# Patient Record
Sex: Male | Born: 1940 | Race: Black or African American | Hispanic: No | Smoking: Current some day smoker
Health system: Southern US, Community
[De-identification: ages and names within clinical notes are randomized; demographics above are authoritative.]

## PROBLEM LIST (undated history)

## (undated) ENCOUNTER — Ambulatory Visit (HOSPITAL_COMMUNITY): Admission: EM | Payer: No Typology Code available for payment source | Source: Home / Self Care

## (undated) DIAGNOSIS — I442 Atrioventricular block, complete: Secondary | ICD-10-CM

## (undated) DIAGNOSIS — J449 Chronic obstructive pulmonary disease, unspecified: Secondary | ICD-10-CM

## (undated) DIAGNOSIS — I10 Essential (primary) hypertension: Secondary | ICD-10-CM

## (undated) DIAGNOSIS — I255 Ischemic cardiomyopathy: Secondary | ICD-10-CM

## (undated) DIAGNOSIS — M545 Low back pain, unspecified: Secondary | ICD-10-CM

## (undated) DIAGNOSIS — J4489 Other specified chronic obstructive pulmonary disease: Secondary | ICD-10-CM

## (undated) DIAGNOSIS — N183 Chronic kidney disease, stage 3 unspecified: Secondary | ICD-10-CM

## (undated) DIAGNOSIS — I447 Left bundle-branch block, unspecified: Secondary | ICD-10-CM

## (undated) DIAGNOSIS — E785 Hyperlipidemia, unspecified: Secondary | ICD-10-CM

## (undated) DIAGNOSIS — G8929 Other chronic pain: Secondary | ICD-10-CM

## (undated) DIAGNOSIS — I5042 Chronic combined systolic (congestive) and diastolic (congestive) heart failure: Secondary | ICD-10-CM

## (undated) DIAGNOSIS — I2510 Atherosclerotic heart disease of native coronary artery without angina pectoris: Secondary | ICD-10-CM

## (undated) DIAGNOSIS — I472 Ventricular tachycardia, unspecified: Secondary | ICD-10-CM

## (undated) DIAGNOSIS — Z91199 Patient's noncompliance with other medical treatment and regimen due to unspecified reason: Secondary | ICD-10-CM

## (undated) DIAGNOSIS — I517 Cardiomegaly: Secondary | ICD-10-CM

## (undated) DIAGNOSIS — Z9119 Patient's noncompliance with other medical treatment and regimen: Secondary | ICD-10-CM

## (undated) DIAGNOSIS — I480 Paroxysmal atrial fibrillation: Secondary | ICD-10-CM

## (undated) DIAGNOSIS — I429 Cardiomyopathy, unspecified: Secondary | ICD-10-CM

## (undated) HISTORY — DX: Atherosclerotic heart disease of native coronary artery without angina pectoris: I25.10

## (undated) HISTORY — DX: Ventricular tachycardia: I47.2

## (undated) HISTORY — DX: Essential (primary) hypertension: I10

## (undated) HISTORY — DX: Other specified chronic obstructive pulmonary disease: J44.89

## (undated) HISTORY — DX: Chronic obstructive pulmonary disease, unspecified: J44.9

## (undated) HISTORY — DX: Hyperlipidemia, unspecified: E78.5

## (undated) HISTORY — DX: Low back pain, unspecified: M54.50

## (undated) HISTORY — DX: Cardiomegaly: I51.7

## (undated) HISTORY — DX: Patient's noncompliance with other medical treatment and regimen: Z91.19

## (undated) HISTORY — PX: CARDIAC CATHETERIZATION: SHX172

## (undated) HISTORY — DX: Cardiomyopathy, unspecified: I42.9

## (undated) HISTORY — DX: Patient's noncompliance with other medical treatment and regimen due to unspecified reason: Z91.199

## (undated) HISTORY — DX: Left bundle-branch block, unspecified: I44.7

## (undated) HISTORY — DX: Low back pain: M54.5

## (undated) HISTORY — DX: Paroxysmal atrial fibrillation: I48.0

## (undated) HISTORY — DX: Other chronic pain: G89.29

---

## 1997-12-22 ENCOUNTER — Emergency Department (HOSPITAL_COMMUNITY): Admission: EM | Admit: 1997-12-22 | Discharge: 1997-12-22 | Payer: Self-pay | Admitting: Emergency Medicine

## 2004-01-22 ENCOUNTER — Emergency Department (HOSPITAL_COMMUNITY): Admission: EM | Admit: 2004-01-22 | Discharge: 2004-01-22 | Payer: Self-pay | Admitting: Emergency Medicine

## 2005-08-23 ENCOUNTER — Emergency Department (HOSPITAL_COMMUNITY): Admission: EM | Admit: 2005-08-23 | Discharge: 2005-08-23 | Payer: Self-pay | Admitting: Emergency Medicine

## 2006-03-23 ENCOUNTER — Inpatient Hospital Stay (HOSPITAL_COMMUNITY): Admission: EM | Admit: 2006-03-23 | Discharge: 2006-03-23 | Payer: Self-pay | Admitting: Emergency Medicine

## 2007-02-02 ENCOUNTER — Emergency Department (HOSPITAL_COMMUNITY): Admission: EM | Admit: 2007-02-02 | Discharge: 2007-02-02 | Payer: Self-pay | Admitting: Emergency Medicine

## 2007-07-16 ENCOUNTER — Ambulatory Visit: Payer: Self-pay | Admitting: Cardiology

## 2007-07-16 ENCOUNTER — Encounter (INDEPENDENT_AMBULATORY_CARE_PROVIDER_SITE_OTHER): Payer: Self-pay | Admitting: *Deleted

## 2007-07-16 ENCOUNTER — Inpatient Hospital Stay (HOSPITAL_COMMUNITY): Admission: EM | Admit: 2007-07-16 | Discharge: 2007-07-19 | Payer: Self-pay | Admitting: Emergency Medicine

## 2008-01-07 ENCOUNTER — Emergency Department (HOSPITAL_COMMUNITY): Admission: EM | Admit: 2008-01-07 | Discharge: 2008-01-07 | Payer: Self-pay | Admitting: Emergency Medicine

## 2008-05-23 ENCOUNTER — Inpatient Hospital Stay (HOSPITAL_COMMUNITY): Admission: EM | Admit: 2008-05-23 | Discharge: 2008-05-27 | Payer: Self-pay | Admitting: Emergency Medicine

## 2008-05-23 ENCOUNTER — Ambulatory Visit: Payer: Self-pay | Admitting: Cardiology

## 2008-07-05 ENCOUNTER — Encounter (HOSPITAL_COMMUNITY): Admission: RE | Admit: 2008-07-05 | Discharge: 2008-09-09 | Payer: Self-pay | Admitting: *Deleted

## 2008-09-10 ENCOUNTER — Encounter (HOSPITAL_COMMUNITY): Admission: RE | Admit: 2008-09-10 | Discharge: 2008-09-10 | Payer: Self-pay | Admitting: *Deleted

## 2010-05-09 ENCOUNTER — Ambulatory Visit: Payer: Self-pay | Admitting: Cardiology

## 2010-06-02 ENCOUNTER — Ambulatory Visit: Payer: Self-pay | Admitting: Cardiology

## 2010-07-19 ENCOUNTER — Ambulatory Visit (HOSPITAL_COMMUNITY): Admission: RE | Admit: 2010-07-19 | Discharge: 2010-07-19 | Payer: Self-pay | Admitting: Oral Surgery

## 2010-11-21 LAB — BASIC METABOLIC PANEL
Calcium: 9.6 mg/dL (ref 8.4–10.5)
GFR calc Af Amer: >60 mL/min (ref >60)
GFR calc non Af Amer: >60 mL/min (ref >60)
Potassium: 3.6 mEq/L (ref 3.5–5.1)
Sodium: 143 mEq/L (ref 135–145)

## 2010-11-21 LAB — CBC
HCT: 40 % (ref 39.0–52.0)
Hemoglobin: 13.1 g/dL (ref 13.0–17.0)
WBC: 5.1 10*3/uL (ref 4.0–10.5)

## 2010-11-21 LAB — PROTIME-INR
INR: 1.01 (ref 0.00–1.49)
Prothrombin Time: 13.5 seconds (ref 11.6–15.2)

## 2010-11-21 LAB — APTT: aPTT: 25 seconds (ref 24–37)

## 2010-12-19 ENCOUNTER — Other Ambulatory Visit: Payer: Self-pay | Admitting: Cardiology

## 2010-12-19 DIAGNOSIS — I2510 Atherosclerotic heart disease of native coronary artery without angina pectoris: Secondary | ICD-10-CM

## 2010-12-19 MED ORDER — CLOPIDOGREL BISULFATE 75 MG PO TABS: 75.0000 mg | ORAL_TABLET | Freq: Every day | ORAL | Status: DC

## 2010-12-19 NOTE — Telephone Encounter (Signed)
Called for refill on Plavix to be sent to [facility]

## 2010-12-19 NOTE — Telephone Encounter (Signed)
Wants his Plavix called in to [facility] [place]

## 2010-12-21 ENCOUNTER — Other Ambulatory Visit: Payer: Self-pay | Admitting: Cardiology

## 2010-12-21 DIAGNOSIS — I2510 Atherosclerotic heart disease of native coronary artery without angina pectoris: Secondary | ICD-10-CM

## 2010-12-21 MED ORDER — CLOPIDOGREL BISULFATE 75 MG PO TABS: 75.0000 mg | ORAL_TABLET | Freq: Every day | ORAL | Status: DC

## 2010-12-21 NOTE — Telephone Encounter (Signed)
Refill of his perscripton medication. Hung up before I could get the name of the drug or pharmacy. I have pulled his chart.

## 2010-12-21 NOTE — Telephone Encounter (Signed)
Called requesting refill on Plavix. Sent to [facility]

## 2011-01-23 NOTE — Cardiovascular Report (Signed)
NAMECULLIN, DISHMAN NO.:  [id number]   MEDICAL RECORD NO.:  [id number]          PATIENT TYPE:  INP   LOCATION:  2907                         FACILITY:  [facility]   PHYSICIAN:  Peter M. Swaziland, M.D.  DATE OF BIRTH:  [birth year]   DATE OF PROCEDURE:  DATE OF DISCHARGE:                            CARDIAC CATHETERIZATION   INDICATIONS FOR PROCEDURE:  A 70 year old black male is admitted with  hypertensive urgency and non-Q-wave myocardial infarction.  Diagnostic  cardiac catheterization performed by Dr. 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  There  is also 90% stenosis in the mid right coronary artery.   PROCEDURE:  Intracoronary stenting of the mid right coronary artery.   EQUIPMENT USED:  Six FR-4 guide with side holes of 0.014 Asahi medium  wire, a 2.5 x 20-mm apex balloon, a 3.0 x 28-mm Promus drug-eluting  stent, and 3.25 x 20-mm Raywick Voyager balloon.   MEDICATIONS:  The patient was dosed with IV Integrilin per pharmacy.  He  was given weight-adjusted heparin, total of 5500 units IV with  subsequent ACT of 285.  He was given 600 mg Plavix p.o.   After anticoagulation, the lesion in the right coronary was crossed  easily with a wire.  It was predilated using a 2.5 x 20-mm apex balloon  up to 6 atmospheres.  We next deployed the stent, a 3.0 x 28-mm Promus  stent in the mid right coronary.  He had 9 and then 12 atmospheres with  the stent balloon.  It was then postdilated using a 3.25 x 20-mm Tiger  Voyager balloon dilated to 14 atmospheres x2.  This yielded an excellent  angiographic result with 0% residual stenosis and TIMI grade 3 flow.  The patient was maintained on Integrilin postprocedure.  He was also on  IV nitroglycerin and nicardipine for blood pressure control.  He was  painfree.  Final interpretation was successful intracoronary stenting of  the mid right coronary artery with a drug-eluting  stent.           ______________________________  Peter M. Swaziland, M.D.     PMJ/MEDQ  D:  05/24/2008  T:  05/25/2008  Job:  [id number]   cc:   Elmore Guise., M.D.

## 2011-01-23 NOTE — H&P (Signed)
NAMEULUS, HAZEN NO.:  [id number]   MEDICAL RECORD NO.:  [id number]          PATIENT TYPE:  INP   LOCATION:  2907                         FACILITY:  [facility]   PHYSICIAN:  Marca Ancona, MD      DATE OF BIRTH:  12/17/1940   DATE OF ADMISSION:  05/23/2008  DATE OF DISCHARGE:                              HISTORY & PHYSICAL   PRIMARY CARDIOLOGIST:  Rosine Abe, MD   HISTORY OF PRESENT ILLNESS:  This is a 70 year old with a history of  hypertension and end-diastolic congestive heart failure who presented to  the emergency department tonight with substernal chest pain and was  noted to have a hypertensive emergency with blood pressure of 244/112  and elevated cardiac enzymes.  The patient states he has been taking his  Coreg and his clonidine without missing doses.  On Friday, he developed  indigestion feeling in his lower chest wall at rest.  This resolved  with Alka-Seltzer.  He woke up Saturday morning with this pain again.  It came and went all day long on Saturday.  The pain was present also  when he woke up today.  The pain again waxed and waned all day long.  Then around 7 p.m., it became constant, 8/10 chest tightness.  His left  arm felt numb.  The pain was not associated in particular with  exertion.  The patient did come to the emergency department tonight  because the pain did not resolve.  He did have some improvement with  nitroglycerin, but chest pain was still present.  His blood pressure was  noted to be 244/112, but it is now decreased at 188/92 with his  nitroglycerin drip.  The patient does have baseline dyspnea on exertion  with lawn mowing or carrying groceries.  He states this has not worsened  recently.  He has no orthopnea or PND.  Also of note, the patient states  that he was sneezing today and took a cold remedy.  He does not remember  which one.  In the emergency department, he did have a chest CT that was  negative for acute change  and a CTA of his chest with no evidence for PE  or aortic dissection.   PAST MEDICAL HISTORY:  1. Hypertension.  The patient was admitted in November 2008 with      hypertensive urgency.  2. Chronic left bundle-branch block.  3. Diastolic congestive heart failure.  The patient had an      echocardiogram in November 2008 showing an EF of 40-50%, moderate      LVH, mild mitral regurgitation, RV with normal size and function.      There was mild diastolic dysfunction.  4. History of SVT.   SOCIAL HISTORY:  The patient lives in [place] with his wife.  He  works part time with an [employer].  He smokes 1 pack per  week of cigarettes.  He does not drink alcohol or use illicit drugs.   FAMILY HISTORY:  There is no family history of premature coronary artery  disease.   REVIEW  OF SYSTEMS:  Negative except as noted in the history of present  illness.   LABORATORY DATA:  D-dimer was elevated at 0.65.  Initial point-of-care  CK-MB was 16.4, repeat was 11.2.  Initial point-of-care troponin was  0.27, repeat was 0.25.  Platelets 118 and hematocrit 40.4.  Potassium  3.5, sodium 144, BUN 10, creatinine 1.1, and glucose 90.  Chest x-ray  showed no evidence of COPD.  No pneumonia or pulmonary edema.  EKG  showed sinus bradycardia at 45.  There is left atrial enlargement and  left bundle-branch block.  There is no acute change.  Head CT showed no  acute abnormality and CT of the chest showed no PE.  No aortic  dissection.   MEDICATIONS AT HOME:  1. Coreg 3.125 mg b.i.d.  2. Clonidine.  The patient is not sure of the dose.  3. The patient takes Lasix p.r.n.  He states he takes this about twice      a week.  4. He does not take aspirin because it gives him stomach upset.   PHYSICAL EXAMINATION:  VITAL SIGNS:  Blood pressure is initially  244/112, this fell to 188/92 with initiation of nitroglycerin drip;  heart rate is 44 and regular; and O2 sat 98% on 2 L nasal cannula.   GENERAL:  This is a well-developed male in no apparent distress.  NEUROLOGIC:  Alert and oriented x3.  There is normal affect.  There is  grossly normal muscular strength in all major muscle groups.  Cranial  nerves II through XII are grossly intact.  NECK:  JVP is elevated 12 cm of water.  There is no thyromegaly or  thyroid nodule.  LUNGS:  Bilateral rhonchi and mildly decreased breath sounds.  CARDIOVASCULAR:  Heart, regular S1 and S2, bradycardic.  There is a  paradoxically split S2.  There is no murmur.  There is a soft S4.  There  is no carotid bruits.  There is no edema.  ABDOMEN:  Soft and nontender.  No hepatosplenomegaly.  There is no mass.  There are normal bowel sounds.  EXTREMITIES:  No clubbing or cyanosis.  HEENT:  Normal.  SKIN:  Normal.  MUSCULOSKELETAL:  Normal.   ASSESSMENT AND PLAN:  This is a 70 year old with a history of  hypertension who presents with hypertensive emergency and chest pain and  positive cardiac enzymes.  1. Hypertension.  The patient is having hypertensive emergency.  Blood      pressure is 244/112.  Question whether this is somewhat related to      his cold remedy use.  Acutely, we will plan on lowering his blood      pressure to not less than 160/90 tonight.  We will use      nitroglycerin drip for now.  We will also add p.o. amlodipine.  The      patient's heart rate is 44 on Coreg and clonidine.  He is on a      quite a low dose of Coreg.  We will keep that on board for now      especially given the risk of rebound hypertension and tachycardia      off clonidine.  2. Coronary artery disease.  The patient has chest pain with positive      cardiac enzymes.  The question is whether this is a true non-ST      elevation MI or demand ischemia in the setting of a hypertensive      emergency and perhaps  fixed underlying coronary stenosis.  A chest      CT did rule out PE and aortic dissection.  When the patient's blood      pressure is lower, I  will start him on heparin drip.  We will go      ahead and initiate that now actually as his blood pressure is now      down below 200 systolic.  We will start him on aspirin 325 mg daily      as well as low dose of Coreg.  We will start him on 80 mg daily of      Lipitor.  If chest pain is still present with lower blood pressure      and cardiac enzymes continue to rise, we will consider adding      Integrilin, and finally, we will consider a left heart      catheterization this admission perhaps as early as tomorrow.  3. Smoking cessation counseling will be done.  4. Volume status.  The patient is mildly volume overloaded on exam.      This is likely due to diastolic dysfunction in the setting of      significantly increased afterload.  The patient's most recent EF      was 40-50% in November 2008.  I will give him Lasix 40 mg IV x1      now.  We will reassess him in the morning.      Marca Ancona, MD  Electronically Signed    DM/MEDQ  D:  05/23/2008  T:  05/25/2008  Job:  [id number]

## 2011-01-23 NOTE — H&P (Signed)
[name], [name] NO.:  [id number]   MEDICAL RECORD NO.:  [id number]          PATIENT TYPE:  INP   LOCATION:  1824                         FACILITY:  [facility]   PHYSICIAN:  Vernice Jefferson, MD          DATE OF BIRTH:  [birth year]   DATE OF ADMISSION:  07/16/2007  DATE OF DISCHARGE:                              HISTORY & PHYSICAL   PRIMARY PHYSICIAN:  The patient does not have one.  He is unassigned,  will be going to Dr. Reyes Ivan.   CHIEF COMPLAINT:  Shortness of breath.   HISTORY OF PRESENT ILLNESS:  The patient is a 70 year old African-  American male, history of hypertension, who presents with acute onset  shortness of breath that awoke him from sleep.  The patient reports that  he has been on clonidine in the past and stopped taking it approximately  2 weeks ago.  The patient does not report any chest pain, although he  did have some diaphoresis.  On arrival EMS reported blood pressures in  the range of 210/120 as well as saturations in the 82 to 85% range.  On  arrival to the ED the patient was noticed to have a sinus tachycardia  with a left bundle branch morphology with nitroglycerin, morphine and  oxygen improved in terms of his respiratory status and still did not  complain of any chest pain throughout this entire procedure.  On my  interrogation the patient was breathing much more comfortably, did not  have any complaints and still did not complain of any chest pain  symptoms.   PAST MEDICAL HISTORY:  Hypertension, no prior cardiac history.   SOCIAL HISTORY:  Lives in [place] with his wife.  Negative for  tobacco, alcohol.   FAMILY HISTORY:  Reviewed and noncontributory to the patient's current  medical condition.  No early cardiomyopathies in the family. No early  cardiovascular disease in the family.   MEDICATIONS:  He is taking none currently.   REVIEW OF SYSTEMS:  Negative 11-point review of systems except otherwise  dictated in the above  HPI.   PHYSICAL EXAMINATION:  VITAL SIGNS:  His blood pressure initially was  210/120, heart rate 160.  On my evaluation he is 160s/90s.  Heart rates  in the 70s.  GENERAL:  Well-developed, well-nourished African-American male in no  acute distress.  HEENT:  Moist mucous membranes.  Nonicteric sclerae.  No conjunctival  pallor.  NECK:  Supple.  Full range of motion.  Jugular venous pressure estimated  at approximately 9 to 10 cmH2O.  CARDIOVASCULAR:  Regular rate and rhythm.  No rubs, murmurs or gallops.  LUNGS:  Right-sided rales, greater than left-sided rales.  ABDOMEN:  Soft, nontender, nondistended. Normoactive bowel sounds.  EXTREMITIES:  No peripheral edema.  Pulses 2+ bilaterally.   X-RAYS/LAB DATA:  Chest x-ray demonstrates a right-sided effusion versus  infiltrate, more likely to be pulmonary edema.  EKG demonstrates sinus  tachycardia with a left bundle branch morphology which is new compared  to prior ECGs.  Laboratory data:  White count 12.5, hemoglobin 14.8.  chemistry-7 is pending.  Troponin is negative, MB mildly elevated at  7.5.  coagulation studies within normal limits.  A 2D echocardiogram  performed at bedside by me personally and reviewed, demonstrated normal  concentric wall motion.  No significant wall-motion abnormalities.  EF  slightly depressed globally in the range of probably 45 to 50% range.  No old echocardiograms to compare to.  Did have LVH.   IMPRESSION:  1. Hypertensive urgency with likely flash pulmonary edema.  2. New left bundle branch block.  3. Hypercapnic respiratory failure as evidenced by venous blood gases;      pCO2 of 100.   PLAN:  We will admit the patient to step-down unit with nitroglycerin  drip, aspirin and heparin for anticoagulation and follow his bio markers  serially.  Given he has had no chest pain, his improvement and symptoms  with only morphine and respiratory support, as well as no significant  wall-motion abnormalities  on his echocardiogram, we will hold on  activating STEMI lab at this point, follow his next set of bio markers.  There is no evidence clinically that he is having an active infarction.  We will initiate statin and ACE inhibition therapy to help with blood  pressure management, get a 2D echocardiogram in the morning formal.      Vernice Jefferson, MD  Electronically Signed     JT/MEDQ  D:  07/16/2007  T:  07/16/2007  Job:  [id number]

## 2011-01-23 NOTE — Cardiovascular Report (Signed)
[name], [name] NO.:  [id number]   MEDICAL RECORD NO.:  [id number]          PATIENT TYPE:  INP   LOCATION:  2907                         FACILITY:  [facility]   PHYSICIAN:  Elmore Guise., M.D.DATE OF BIRTH:  [birth year]   DATE OF PROCEDURE:  05/24/2008  DATE OF DISCHARGE:                            CARDIAC CATHETERIZATION   INDICATIONS FOR PROCEDURE:  Acute coronary syndrome.   HISTORY OF PRESENT ILLNESS:  [name] is a 70 year old white male with  past medical history of hypertension, COPD who presented with off and on  chest pain for the last week.  He was admitted with hypertensive urgency  with blood pressure 250/120, however, continued to have chest pain  despite blood pressure improvement.  He is now referred for cardiac  catheterization.   DESCRIPTION OF PROCEDURE:  The patient was brought to the Cardiac Cath  Lab.  After appropriate informed consent, he was prepped and draped in  sterile fashion.  Approximately, 5 mL of 1% lidocaine was used for local  anesthesia.  A 5-French sheath was placed in the right femoral artery  under fluoroscopic guidance.  Coronary angiography and LV angiography  were then performed.  Sheath was left in place for possible PCI.   FINDINGS:  1. Left main:  Normal.  2. LAD:  Large vessel with mild luminal irregularities.  3. D1:  Moderate-sized vessel with mild luminal irregularities.  4. LCX:  Nondominant with mild luminal irregularities.  5. OM1:  Subtotal obstruction at trifurcation with 3 smaller vessels      all filling faintly via antegrade and left-to-left collaterals.  6. RCA:  Moderate-sized vessel with mid 80% stenosis with diffuse      luminal irregularities.  7. PDA/PLV:  Mild luminal irregularities.  8. LV:  EF is 50% with no significant wall motion abnormalities.  9. Limited right femoral angiogram was performed.  It showed a 50%      likely vessel spasm at sheath insertion, otherwise diffuse luminal     irregularities.  No evidence of dissection or extravasation of dye.   IMPRESSION:  1. 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  2. Low normal left ventricular systolic function, ejection fraction      50%.   PLAN:  At this time, I would recommend aggressive medical therapy.  I  discussed films with Dr. Swaziland, interventional cardiologist, who will  proceed with elective PCI of RCA with medical treatment of his OM  disease.  Following procedure, the patient will be transported back to  the CCU for continued care.     Elmore Guise., M.D.  Electronically Signed    TWK/MEDQ  D:  05/24/2008  T:  05/25/2008  Job:  [id number]

## 2011-01-23 NOTE — Discharge Summary (Signed)
[name], PICKUP NO.:  [id number]   MEDICAL RECORD NO.:  [id number]          PATIENT TYPE:  INP   LOCATION:  4713                         FACILITY:  [facility]   PHYSICIAN:  Elmore Guise., M.D.DATE OF BIRTH:  September 18, 1940   DATE OF ADMISSION:  07/16/2007  DATE OF DISCHARGE:  07/19/2007                               DISCHARGE SUMMARY   DISCHARGE DIAGNOSIS:  1. Hypertensive urgency.  2. Mild volume overload secondary to #1 number  3. Mild LV dysfunction.  4. Diastolic dysfunction  5. History of noncompliance.   HISTORY OF PRESENT ILLNESS:  [name] is a 70 year old African  American male who presented with 3-day history of increasing shortness  of breath.  He presented to the emergency department in near respiratory  failure.   HOSPITAL COURSE:  The patient was treated aggressively.  He is initially  started on BiPAP, nitroglycerin drip and given IV antihypertensives with  significant improvement in his symptoms.  He was quickly weaned from his  BiPAP.  He was initially treated with IV Lasix and improved very  quickly.  On arrival, he was noted to have SVT with heart rates in the  180s.  This improved significantly with treatment of his hypertension as  well as respiratory problems.  After being transferred to the CCU which  he remained in for 24 hours, he was transferred to telemetry monitoring.  He had brief nonsustained episodes of atrial tachycardia noted.  He has  now been up and ambulatory.  His blood pressure has had significant  improvement, now improving to the 150 range.  He seems to be tolerating  his current medications very well.  He is euvolemic by exam his cardiac  markers were negative for acute coronary syndrome.  He will be  discharged home today to continue the following medications.   DISCHARGE MEDICATIONS:  1. Lotrel 05/20 mg one tablet twice daily.  2. Coreg 3.25 mg one tablet twice daily and  3. Lasix 3 mg one tablet one time  daily.  4. Aspirin 325 mg daily.  5. Potassium 40 mEq daily.   DISCHARGE INSTRUCTIONS:  He was given heart failure instructions.  Specifically, he was told to weigh himself daily.  If his weight  increases more than 2 pounds in any 24 hours, he is to take an  additional dose of Lasix.  Unless he has any further problems, I plan to  see him back in the office in 2 weeks.  All his questions were answered  prior to discharge      Elmore Guise., M.D.  Electronically Signed    TWK/MEDQ  D:  07/19/2007  T:  07/21/2007  Job:  [id number]

## 2011-01-26 NOTE — Discharge Summary (Signed)
[name], [name] NO.:  [id number]   MEDICAL RECORD NO.:  [id number]          PATIENT TYPE:  INP   LOCATION:  2005                         FACILITY:  [facility]   PHYSICIAN:  Elmore Guise., M.D.DATE OF BIRTH:  [birth year]   DATE OF ADMISSION:  05/23/2008  DATE OF DISCHARGE:  05/27/2008                               DISCHARGE SUMMARY   DISCHARGE DIAGNOSES:  1. Acute coronary syndrome.  2. Hypertension/hypertensive urgency.  3. Dyslipidemia.  4. Right forearm cellulitis.  5. Chronic LBBB   HISTORY OF PRESENT ILLNESS:  [name] is a 70 year old African-  American male who presented with chest pain and hypertensive urgency.  He was admitted for evaluation and treatment.   HOSPITAL COURSE:  The patient was admitted to the CCU.  He was placed on  nicardipine and nitroglycerin drip with good blood pressure control.  He  continued to have chest pain despite blood pressure control and ruled in  for a myocardial infarction.  After blood pressure control was achieved,  he was taken to the cath lab, and he was shown to have obstructive mid  RCA as well as obstructive obtuse marginal disease.  His mid RCA was  treated with stenting.  His obtuse marginal had faint collaterals and  thought to be chronic in nature and due to the size of the vessel being  very small, it was deemed at that time not to proceed with either  angioplasty or stenting.  Following his catheterization, the patient  remained chest pain free.  His blood pressure remained well controlled.  He did have a fever up to 101.8.  This was associated with a right  forearm cellulitis from an old IV site.  He was treated with Ancef for  36 hours with resolution of his fever.  He is now been tolerating  cardiac rehab very well.  He has had no further chest pain or shortness  of breath.   DISCHARGE MEDICATIONS:  He will be discharged, to continue the following  medications:  1. Coreg 3.125 mg twice daily.  2. Aspirin 325 mg daily.  3. Plavix 75 mg daily.  4. Amlodipine 10 mg daily.  5. Imdur 60 mg daily.  6. Lipitor 40 mg daily.  7. Keflex 500 mg 3 times daily for the next 7 days.  8. Nitroglycerin 0.4 mg 1 sublingual q.5 minutes p.r.n. chest pain.   The patient will come by the office for samples of his Plavix and  Lipitor.  I did discuss slowly increase in his activity as tolerated.  He is greater than 48 hours from his catheterization.  He should not  have no significant post  cath restrictions.  He is to call the office if he has any problems such  as increasing chest pain, shortness of breath, orthopnea, PND, or  swelling from his cath site.  His follow-up appointment will be with Dr.  Reyes Brewer in 1-2 weeks.  He is to call if he has any further problems.      Elmore Guise., M.D.  Electronically Signed     TWK/MEDQ  D:  05/28/2008  T:  05/29/2008  Job:  [id number]

## 2011-04-16 ENCOUNTER — Other Ambulatory Visit: Payer: Self-pay | Admitting: Cardiology

## 2011-04-16 NOTE — Telephone Encounter (Signed)
escribe medication per fax request  

## 2011-06-05 LAB — CBC
HCT: 39.3
Hemoglobin: 12.8 — ABNORMAL LOW
MCHC: 32.7
MCV: 87.7
Platelets: 147 — ABNORMAL LOW
RBC: 4.48
RDW: 13
WBC: 4.9

## 2011-06-05 LAB — POCT CARDIAC MARKERS
CKMB, poc: 1.7
Myoglobin, poc: 56.7
Operator id: 270651
Troponin i, poc: <0.05

## 2011-06-05 LAB — DIFFERENTIAL
Basophils Absolute: 0
Basophils Relative: 1
Lymphocytes Relative: 45
Monocytes Absolute: 0.6
Neutro Abs: 2
Neutrophils Relative %: 40 — ABNORMAL LOW

## 2011-06-05 LAB — POCT I-STAT, CHEM 8
BUN: 8
Calcium, Ion: 1.19
Chloride: 102
HCT: 43
Potassium: 3.7

## 2011-06-05 LAB — B-NATRIURETIC PEPTIDE (CONVERTED LAB): Pro B Natriuretic peptide (BNP): 85

## 2011-06-11 LAB — CULTURE, BLOOD (ROUTINE X 2)
Culture: NO GROWTH
Culture: NO GROWTH

## 2011-06-11 LAB — URINE CULTURE
Colony Count: NO GROWTH
Culture: NO GROWTH

## 2011-06-11 LAB — BASIC METABOLIC PANEL
BUN: 12
BUN: 4 — ABNORMAL LOW
Calcium: 9.2
Chloride: 103
Chloride: 105
Creatinine, Ser: 0.67
Creatinine, Ser: 0.88
GFR calc non Af Amer: >60
GFR calc non Af Amer: >60
Glucose, Bld: 108 — ABNORMAL HIGH
Glucose, Bld: 89
Potassium: 3.3 — ABNORMAL LOW
Sodium: 140

## 2011-06-11 LAB — CBC
HCT: 35.8 — ABNORMAL LOW
HCT: 36.3 — ABNORMAL LOW
Hemoglobin: 12.1 — ABNORMAL LOW
MCV: 85.7
Platelets: 114 — ABNORMAL LOW
RDW: 13.3
RDW: 13.3
WBC: 9.8

## 2011-06-13 LAB — CBC
HCT: 39.5
HCT: 40.4
Hemoglobin: 13.2
MCHC: 32.6
MCHC: 33.8
MCV: 84.7
MCV: 85.8
MCV: 86.1
MCV: 87.3
Platelets: 117 — ABNORMAL LOW
Platelets: 118 — ABNORMAL LOW
Platelets: 123 — ABNORMAL LOW
Platelets: 126 — ABNORMAL LOW
RBC: 4.23
RBC: 4.4
RBC: 4.63
RDW: 13.6
RDW: 13.6
RDW: 13.6
WBC: 4.9
WBC: 8.9

## 2011-06-13 LAB — CK TOTAL AND CKMB (NOT AT ARMC)
CK, MB: 158.4 — ABNORMAL HIGH
CK, MB: 35.2 — ABNORMAL HIGH
CK, MB: 91.6 — ABNORMAL HIGH
Relative Index: 12.9 — ABNORMAL HIGH
Total CK: 350 — ABNORMAL HIGH
Total CK: 617 — ABNORMAL HIGH
Total CK: 991 — ABNORMAL HIGH

## 2011-06-13 LAB — COMPREHENSIVE METABOLIC PANEL
ALT: 18
AST: 41 — ABNORMAL HIGH
Albumin: 4
CO2: 29
Chloride: 104
Creatinine, Ser: 0.78
GFR calc Af Amer: >60
Sodium: 140
Total Bilirubin: 1.3 — ABNORMAL HIGH

## 2011-06-13 LAB — POCT CARDIAC MARKERS
CKMB, poc: 11.2
CKMB, poc: 16.4
Myoglobin, poc: 67.3
Myoglobin, poc: 74.3
Troponin i, poc: 0.25 — ABNORMAL HIGH
Troponin i, poc: 0.27 — ABNORMAL HIGH

## 2011-06-13 LAB — PROTIME-INR
INR: 1.2
Prothrombin Time: 15.1

## 2011-06-13 LAB — TSH: TSH: 4.182

## 2011-06-13 LAB — DIFFERENTIAL
Basophils Relative: 1
Eosinophils Absolute: 0.1
Lymphs Abs: 2.9
Neutrophils Relative %: 31 — ABNORMAL LOW

## 2011-06-13 LAB — LIPID PANEL
HDL: 63
Total CHOL/HDL Ratio: 2.9
Triglycerides: 52
VLDL: 10

## 2011-06-13 LAB — POCT I-STAT, CHEM 8
BUN: 10
HCT: 43
Hemoglobin: 14.6
Sodium: 144
TCO2: 29

## 2011-06-13 LAB — TROPONIN I
Troponin I: 1.06
Troponin I: 2.06

## 2011-06-13 LAB — D-DIMER, QUANTITATIVE: D-Dimer, Quant: 0.65 — ABNORMAL HIGH

## 2011-06-13 LAB — HEPARIN LEVEL (UNFRACTIONATED): Heparin Unfractionated: 0.49

## 2011-06-13 LAB — B-NATRIURETIC PEPTIDE (CONVERTED LAB): Pro B Natriuretic peptide (BNP): 417 — ABNORMAL HIGH

## 2011-06-19 LAB — BASIC METABOLIC PANEL
BUN: 13
CO2: 28
CO2: 28
CO2: 28
Calcium: 8.5
Calcium: 8.8
Calcium: 9
Chloride: 105
GFR calc Af Amer: >60
GFR calc Af Amer: >60
Glucose, Bld: 95
Potassium: 3.4 — ABNORMAL LOW
Potassium: 3.5
Sodium: 137
Sodium: 139
Sodium: 140

## 2011-06-19 LAB — COMPREHENSIVE METABOLIC PANEL
ALT: 17
AST: 35
Albumin: 3.6
Alkaline Phosphatase: 85
BUN: 14
Chloride: 103
Potassium: 3 — ABNORMAL LOW
Sodium: 142
Total Bilirubin: 1.3 — ABNORMAL HIGH
Total Protein: 6.3

## 2011-06-19 LAB — POCT I-STAT 3, ART BLOOD GAS (G3+)
O2 Saturation: 98
TCO2: 24
pCO2 arterial: 53.5 — ABNORMAL HIGH
pO2, Arterial: 126 — ABNORMAL HIGH

## 2011-06-19 LAB — CBC
HCT: 36.3 — ABNORMAL LOW
HCT: 36.8 — ABNORMAL LOW
HCT: 37.9 — ABNORMAL LOW
HCT: 44.7
Hemoglobin: 12.1 — ABNORMAL LOW
Hemoglobin: 12.5 — ABNORMAL LOW
Hemoglobin: 14.8
MCHC: 33
MCHC: 33.1
MCHC: 33.3
MCV: 87.2
Platelets: 113 — ABNORMAL LOW
Platelets: 124 — ABNORMAL LOW
RBC: 4.28
RBC: 5.13
RDW: 13.1
WBC: 8.3

## 2011-06-19 LAB — LIPID PANEL
Cholesterol: 180
HDL: 60
Total CHOL/HDL Ratio: 3
VLDL: 13

## 2011-06-19 LAB — I-STAT 8, (EC8 V) (CONVERTED LAB)
Acid-base deficit: 8 — ABNORMAL HIGH
Potassium: 3.5
TCO2: 30
pH, Ven: 7.019 — CL

## 2011-06-19 LAB — DIFFERENTIAL
Basophils Relative: 0
Eosinophils Absolute: 0.1
Monocytes Absolute: 0.8 — ABNORMAL HIGH
Neutro Abs: 3.1

## 2011-06-19 LAB — CARDIAC PANEL(CRET KIN+CKTOT+MB+TROPI)
CK, MB: 6.1 — ABNORMAL HIGH
CK, MB: 8.3 — ABNORMAL HIGH
Relative Index: 0.8
Troponin I: 0.14 — ABNORMAL HIGH

## 2011-06-19 LAB — CK TOTAL AND CKMB (NOT AT ARMC)
CK, MB: 12.5 — ABNORMAL HIGH
Relative Index: 1.6
Total CK: 791 — ABNORMAL HIGH

## 2011-06-19 LAB — TROPONIN I: Troponin I: 0.15 — ABNORMAL HIGH

## 2011-06-19 LAB — POCT I-STAT CREATININE
Creatinine, Ser: 1.2
Operator id: 279831

## 2011-06-19 LAB — POCT CARDIAC MARKERS: Myoglobin, poc: 469

## 2011-06-19 LAB — PHOSPHORUS: Phosphorus: 4.6

## 2011-08-04 ENCOUNTER — Other Ambulatory Visit: Payer: Self-pay | Admitting: Cardiology

## 2011-08-08 MED ORDER — CLOPIDOGREL BISULFATE 75 MG PO TABS: 75.0000 mg | ORAL_TABLET | Freq: Every day | ORAL | Status: DC

## 2011-08-08 NOTE — Telephone Encounter (Signed)
Addended by: Royanne Foots on: 08/08/2011 10:50 AM   Modules accepted: Orders

## 2011-09-10 ENCOUNTER — Telehealth: Payer: Self-pay | Admitting: Cardiology

## 2011-09-10 MED ORDER — AMLODIPINE BESYLATE 10 MG PO TABS: 10.0000 mg | ORAL_TABLET | Freq: Every day | ORAL | Status: DC

## 2011-09-10 NOTE — Telephone Encounter (Signed)
New Problem:    Patient called needing a refill of his amLODipine (NORVASC) 10 MG tablet.

## 2011-10-10 ENCOUNTER — Encounter: Payer: Self-pay | Admitting: Cardiology

## 2011-10-10 ENCOUNTER — Ambulatory Visit (INDEPENDENT_AMBULATORY_CARE_PROVIDER_SITE_OTHER): Payer: Medicare Other | Admitting: Cardiology

## 2011-10-10 VITALS — BP 156/84 | HR 64 | Ht 68.0 in | Wt 154.0 lb

## 2011-10-10 DIAGNOSIS — I10 Essential (primary) hypertension: Secondary | ICD-10-CM

## 2011-10-10 DIAGNOSIS — I2510 Atherosclerotic heart disease of native coronary artery without angina pectoris: Secondary | ICD-10-CM | POA: Insufficient documentation

## 2011-10-10 DIAGNOSIS — Z9119 Patient's noncompliance with other medical treatment and regimen: Secondary | ICD-10-CM | POA: Insufficient documentation

## 2011-10-10 DIAGNOSIS — R0609 Other forms of dyspnea: Secondary | ICD-10-CM

## 2011-10-10 DIAGNOSIS — Z91199 Patient's noncompliance with other medical treatment and regimen due to unspecified reason: Secondary | ICD-10-CM | POA: Insufficient documentation

## 2011-10-10 DIAGNOSIS — F17200 Nicotine dependence, unspecified, uncomplicated: Secondary | ICD-10-CM

## 2011-10-10 DIAGNOSIS — R0600 Dyspnea, unspecified: Secondary | ICD-10-CM

## 2011-10-10 DIAGNOSIS — Z720 Tobacco use: Secondary | ICD-10-CM | POA: Insufficient documentation

## 2011-10-10 DIAGNOSIS — I447 Left bundle-branch block, unspecified: Secondary | ICD-10-CM

## 2011-10-10 DIAGNOSIS — E785 Hyperlipidemia, unspecified: Secondary | ICD-10-CM | POA: Insufficient documentation

## 2011-10-10 MED ORDER — NITROGLYCERIN 0.4 MG SL SUBL: 0.4000 mg | SUBLINGUAL_TABLET | SUBLINGUAL | Status: DC | PRN

## 2011-10-10 MED ORDER — AMLODIPINE BESYLATE 10 MG PO TABS: 10.0000 mg | ORAL_TABLET | Freq: Every day | ORAL | Status: DC

## 2011-10-10 MED ORDER — ATORVASTATIN CALCIUM 10 MG PO TABS: 10.0000 mg | ORAL_TABLET | Freq: Every day | ORAL | Status: DC

## 2011-10-10 MED ORDER — CLOPIDOGREL BISULFATE 75 MG PO TABS: 75.0000 mg | ORAL_TABLET | Freq: Every day | ORAL | Status: DC

## 2011-10-10 MED ORDER — HYDROCHLOROTHIAZIDE 25 MG PO TABS: 25.0000 mg | ORAL_TABLET | Freq: Every day | ORAL | Status: DC

## 2011-10-10 NOTE — Assessment & Plan Note (Signed)
>>  ASSESSMENT AND PLAN FOR CAD (CORONARY ARTERY DISEASE) WRITTEN ON 10/10/2011  2:21 PM BY Swaziland, PETER M, MD  Status post stenting of the midright coronary in 2009. He does have symptoms of dyspnea and left shoulder discomfort. He has a chronic left bundle branch block. We'll schedule him for a [employer] study. I suspect a lot of his symptoms are related to his uncontrolled hypertension.

## 2011-10-10 NOTE — Assessment & Plan Note (Signed)
Status post stenting of the midright coronary in 2009. He does have symptoms of dyspnea and left shoulder discomfort. He has a chronic left bundle branch block. We'll schedule him for a [employer] study. I suspect a lot of his symptoms are related to his uncontrolled hypertension.

## 2011-10-10 NOTE — Assessment & Plan Note (Signed)
>>  ASSESSMENT AND PLAN FOR CORONARY ARTERY DISEASE INVOLVING NATIVE CORONARY ARTERY OF NATIVE HEART WITHOUT ANGINA PECTORIS WRITTEN ON 06/12/2023  7:15 AM BY Tasmin Exantus, DO  >>ASSESSMENT AND PLAN FOR CAD (CORONARY ARTERY DISEASE) WRITTEN ON 10/10/2011  2:21 PM BY [place], PETER M, MD  Status post stenting of the midright coronary in 2009. He does have symptoms of dyspnea and left shoulder discomfort. He has a chronic left bundle branch block. We'll schedule him for a [employer] study. I suspect a lot of his symptoms are related to his uncontrolled hypertension.

## 2011-10-10 NOTE — Assessment & Plan Note (Signed)
Poorly controlled blood pressure. I reviewed his Norvasc. We will start him back on HCTZ 25 mg per day. He is intolerant to ACE inhibitor, ARB, and beta blocker therapy. I stressed the importance of sodium restriction and a heart healthy diet. His blood pressure is still poorly controlled I would add hydralazine.

## 2011-10-10 NOTE — Assessment & Plan Note (Signed)
We will check fasting lab work as a baseline. We'll start him on Lipitor 10 mg daily.

## 2011-10-10 NOTE — Assessment & Plan Note (Signed)
I stressed the importance of smoking cessation. He states he only smokes when he is stressed. I told him he needed to quit smoking completely.

## 2011-10-10 NOTE — Progress Notes (Signed)
   [name] Date of Birth: [birth year] Medical Record #[id number]  History of Present Illness: [name] is seen today for followup. He was last seen in September of 2011. He has been noncompliant with his medical therapy. He complains that he has had increased shortness of breath with exertion and numbness in his left shoulder. His blood pressure at home has been very elevated. He ran out of his Plavix in December and is about to run out of his Norvasc. He previously was also taking Lipitor, isosorbide, hydralazine, and HCTZ. All these medicines have been discontinued. Review of his old records indicate that he was noncompliant previously and had a poor understanding of his medical condition. He does have a history of coronary disease and had stenting of a long lesion in the mid right coronary in 2009 with a drug-eluting stent. He has a history of severe hypertension, dyslipidemia, bradycardia, and mild LV dysfunction. He has a history of intolerance to ACE inhibitors and angiotensin receptor blockers. He is not a candidate for beta blockers because of bradycardia. He reports gastric upset with aspirin in the past.  No current outpatient prescriptions on file prior to visit.    No Known Allergies  Past Medical History  Diagnosis Date  . Acute coronary syndrome   . Dyslipidemia   . Cellulitis     Right forearm cellulitis  . LBBB (left bundle branch block)   . CAD (coronary artery disease)     s/p stent RCA DES 9/09  . HTN (hypertension)     Past Surgical History  Procedure Date  . Cardiac catheterization     ejection fraction 50%    History  Smoking status  . Current Everyday Smoker  Smokeless tobacco  . Not on file    History  Alcohol Use No    No family history on file.  Review of Systems: As noted in history of present illness.  All other systems were reviewed and are negative.  Physical Exam: BP 156/84  Pulse 64  Ht 5\' 8"  (1.727 m)  Wt 154 lb (69.854 kg)   BMI 23.42 kg/m2 Is a pleasant black male in no acute distress.The patient is alert and oriented x 3.  The mood and affect are normal.  The skin is warm and dry.  Color is normal.  The HEENT exam reveals that the sclera are nonicteric.  The mucous membranes are moist.  The carotids are 2+ without bruits.  There is no thyromegaly.  There is no JVD.  The lungs are clear.  The chest wall is non tender.  The heart exam reveals a regular rate with a normal S1 and S2.  There is a soft systolic murmur heard at the left sternal border..  The PMI is not displaced.   Abdominal exam reveals good bowel sounds.  There is no guarding or rebound.  There is no hepatosplenomegaly or tenderness.  There are no masses.  Exam of the legs reveal no clubbing, cyanosis, or edema.  The legs are without rashes.  The distal pulses are intact.  Cranial nerves II - XII are intact.  Motor and sensory functions are intact.  The gait is normal.  LABORATORY DATA: ECG shows sinus bradycardia with a left bundle branch block.  Assessment / Plan:

## 2011-10-10 NOTE — Patient Instructions (Signed)
Continue on Plavix 75 mg daily and Norvasc 10 mg daily.  We will start you back on HCTZ 25 mg daily for your blood pressure and Lipitor 10 mg daily for cholesterol.  We will schedule you for a nuclear stress test.  Stop smoking completely!  We will check fasting lab work when you come for your stress test.

## 2011-10-24 ENCOUNTER — Other Ambulatory Visit (HOSPITAL_COMMUNITY): Payer: PRIVATE HEALTH INSURANCE | Admitting: Radiology

## 2011-10-24 ENCOUNTER — Other Ambulatory Visit: Payer: PRIVATE HEALTH INSURANCE | Admitting: *Deleted

## 2011-11-07 ENCOUNTER — Ambulatory Visit: Payer: PRIVATE HEALTH INSURANCE | Admitting: Nurse Practitioner

## 2011-11-12 ENCOUNTER — Ambulatory Visit (HOSPITAL_COMMUNITY): Payer: Medicare Other | Attending: Internal Medicine | Admitting: Radiology

## 2011-11-12 ENCOUNTER — Other Ambulatory Visit (INDEPENDENT_AMBULATORY_CARE_PROVIDER_SITE_OTHER): Payer: Medicare Other

## 2011-11-12 DIAGNOSIS — J4489 Other specified chronic obstructive pulmonary disease: Secondary | ICD-10-CM | POA: Insufficient documentation

## 2011-11-12 DIAGNOSIS — E785 Hyperlipidemia, unspecified: Secondary | ICD-10-CM | POA: Insufficient documentation

## 2011-11-12 DIAGNOSIS — I2510 Atherosclerotic heart disease of native coronary artery without angina pectoris: Secondary | ICD-10-CM

## 2011-11-12 DIAGNOSIS — R000 Tachycardia, unspecified: Secondary | ICD-10-CM | POA: Insufficient documentation

## 2011-11-12 DIAGNOSIS — R0609 Other forms of dyspnea: Secondary | ICD-10-CM | POA: Insufficient documentation

## 2011-11-12 DIAGNOSIS — R079 Chest pain, unspecified: Secondary | ICD-10-CM | POA: Insufficient documentation

## 2011-11-12 DIAGNOSIS — R209 Unspecified disturbances of skin sensation: Secondary | ICD-10-CM | POA: Insufficient documentation

## 2011-11-12 DIAGNOSIS — R0600 Dyspnea, unspecified: Secondary | ICD-10-CM

## 2011-11-12 DIAGNOSIS — I10 Essential (primary) hypertension: Secondary | ICD-10-CM | POA: Insufficient documentation

## 2011-11-12 DIAGNOSIS — R002 Palpitations: Secondary | ICD-10-CM | POA: Insufficient documentation

## 2011-11-12 DIAGNOSIS — I447 Left bundle-branch block, unspecified: Secondary | ICD-10-CM | POA: Insufficient documentation

## 2011-11-12 DIAGNOSIS — Z87891 Personal history of nicotine dependence: Secondary | ICD-10-CM | POA: Insufficient documentation

## 2011-11-12 DIAGNOSIS — J449 Chronic obstructive pulmonary disease, unspecified: Secondary | ICD-10-CM | POA: Insufficient documentation

## 2011-11-12 DIAGNOSIS — R0989 Other specified symptoms and signs involving the circulatory and respiratory systems: Secondary | ICD-10-CM | POA: Insufficient documentation

## 2011-11-12 DIAGNOSIS — R42 Dizziness and giddiness: Secondary | ICD-10-CM | POA: Insufficient documentation

## 2011-11-12 DIAGNOSIS — R0602 Shortness of breath: Secondary | ICD-10-CM

## 2011-11-12 DIAGNOSIS — R5381 Other malaise: Secondary | ICD-10-CM | POA: Insufficient documentation

## 2011-11-12 LAB — CBC WITH DIFFERENTIAL/PLATELET
Basophils Relative: 0.4 % (ref 0.0–3.0)
Eosinophils Relative: 0.9 % (ref 0.0–5.0)
HCT: 38.7 % — ABNORMAL LOW (ref 39.0–52.0)
Hemoglobin: 12.3 g/dL — ABNORMAL LOW (ref 13.0–17.0)
Lymphs Abs: 3.2 10*3/uL (ref 0.7–4.0)
MCV: 87.9 fl (ref 78.0–100.0)
Monocytes Absolute: 0.4 10*3/uL (ref 0.1–1.0)
Monocytes Relative: 7.4 % (ref 3.0–12.0)
Neutro Abs: 1.5 10*3/uL (ref 1.4–7.7)
Platelets: 141 10*3/uL — ABNORMAL LOW (ref 150.0–400.0)
RBC: 4.4 Mil/uL (ref 4.22–5.81)
WBC: 5.2 10*3/uL (ref 4.5–10.5)

## 2011-11-12 LAB — BASIC METABOLIC PANEL
BUN: 10 mg/dL (ref 6–23)
CO2: 29 mEq/L (ref 19–32)
Chloride: 104 mEq/L (ref 96–112)
Creatinine, Ser: 0.7 mg/dL (ref 0.4–1.5)
Potassium: 2.9 mEq/L — ABNORMAL LOW (ref 3.5–5.1)

## 2011-11-12 LAB — HEPATIC FUNCTION PANEL
ALT: 8 U/L (ref 0–53)
AST: 14 U/L (ref 0–37)
Albumin: 4 g/dL (ref 3.5–5.2)
Total Protein: 7 g/dL (ref 6.0–8.3)

## 2011-11-12 LAB — LIPID PANEL
Cholesterol: 139 mg/dL (ref 0–200)
Triglycerides: 101 mg/dL (ref 0.0–149.0)

## 2011-11-12 MED ORDER — TECHNETIUM TC 99M TETROFOSMIN IV KIT
33.0000 | PACK | Freq: Once | INTRAVENOUS | Status: AC | PRN
  Administered 2011-11-12: 33 via INTRAVENOUS

## 2011-11-12 MED ORDER — REGADENOSON 0.4 MG/5ML IV SOLN
0.4000 mg | Freq: Once | INTRAVENOUS | Status: AC
  Administered 2011-11-12: 0.4 mg via INTRAVENOUS

## 2011-11-12 MED ORDER — TECHNETIUM TC 99M TETROFOSMIN IV KIT
10.5000 | PACK | Freq: Once | INTRAVENOUS | Status: AC | PRN
  Administered 2011-11-12: 11 via INTRAVENOUS

## 2011-11-12 NOTE — Progress Notes (Signed)
[facility] SITE 3 NUCLEAR MED [address] [place] [address] [phone]  Cardiology Nuclear Med Study  LOWEN BARRINGER is a 71 y.o. male [id number] May 07, 1941   Nuclear Med Background Indication for Stress Test:  Evaluation for Ischemia History:  '08 Echo:EF=40-50%, mild LVD; '09 NSTEMI>Stent-RCA; COPD Cardiac Risk Factors: History of Smoking-quit x 3 weeks, Hypertension, LBBB and Lipids  Symptoms:  Chest Pain with (B) Shoulder Numbness (last episode of chest discomfort was about 2-weeks ago), Dizziness, DOE, Fatigue, Palpitations and Rapid HR   Nuclear Pre-Procedure Caffeine/Decaff Intake:  None NPO After: 7:00pm   Lungs:  Slight expiratory wheezes                               throughout, O2 SAT 98% on RA. IV 0.9% NS with Angio Cath:  20g  IV Site: R Antecubital  IV Started by:  Stanton Kidney, EMT-P  Chest Size (in):  38 Cup Size: n/a  Height: 5\' 8"  (1.727 m)  Weight:  151 lb (68.493 kg)  BMI:  Body mass index is 22.96 kg/(m^2). Tech Comments:  NA    Nuclear Med Study 1 or 2 day study: 1 day  Stress Test Type:  Lexiscan  Reading MD: Cassell Clement, MD  Order Authorizing Provider:  Peter Swaziland, MD  Resting Radionuclide: Technetium 100m Tetrofosmin  Resting Radionuclide Dose: 10.5 mCi   Stress Radionuclide:  Technetium 72m Tetrofosmin  Stress Radionuclide Dose: 33.0 mCi           Stress Protocol Rest HR: 53 Stress HR: 75  Rest BP: 151/73 Stress BP: 150/85  Exercise Time (min): n/a METS: n/a   Predicted Max HR: 152 bpm % Max HR: 49.34 bpm Rate Pressure Product: 40347   Dose of Adenosine (mg):  n/a Dose of Lexiscan: 0.4 mg  Dose of Atropine (mg): n/a Dose of Dobutamine: n/a mcg/kg/min (at max HR)  Stress Test Technologist: Smiley Houseman, CMA-N  Nuclear Technologist:  Domenic Polite, CNMT     Rest Procedure:  Myocardial perfusion imaging was performed at rest 45 minutes following the intravenous administration of Technetium 34m  Tetrofosmin.  Rest ECG: LBBB, LVH with occasional PVC.  Stress Procedure:  The patient received IV Lexiscan 0.4 mg over 15-seconds.  Technetium 22m Tetrofosmin injected at 30-seconds.  There were no significant changes with Lexiscan, occasional PVC noted.  Quantitative spect images were obtained after a 45 minute delay.  Stress ECG: Uninteretable due to baseline LBBB  QPS Raw Data Images:  Images were motion corrected.  Soft tissue (diaphragm) underlies heart. Stress Images:  Normal homogeneous uptake in all areas of the myocardium. Rest Images:  Comparison with the stress images reveals no significant change. Subtraction (SDS):  No evidence of ischemia. Transient Ischemic Dilatation (Normal <1.22):  1.06 Lung/Heart Ratio (Normal <0.45):  0.23  Quantitative Gated Spect Images QGS EDV:  149 ml QGS ESV:  90 ml QGS cine images:  LVEF calculated at 40% with inferior and septal hypokinesis. QGS EF: 40%  Impression Exercise Capacity:  Lexiscan with no exercise. BP Response:  Normal blood pressure response. Clinical Symptoms:  No chest pain. ECG Impression:  Baseline:  LBBB.  EKG uninterpretable due to LBBB at rest and stress. Comparison with Prior Nuclear Study: No previous nuclear study performed  Overall Impression:  Myoview scan with normal perfusion.  LVEF 40%.  Consider echo if not done to reevaluate LV systolic function.

## 2011-11-13 ENCOUNTER — Other Ambulatory Visit: Payer: Self-pay

## 2011-11-13 DIAGNOSIS — I10 Essential (primary) hypertension: Secondary | ICD-10-CM

## 2011-11-13 MED ORDER — POTASSIUM CHLORIDE CRYS ER 20 MEQ PO TBCR: 20.0000 meq | EXTENDED_RELEASE_TABLET | Freq: Two times a day (BID) | ORAL | Status: DC

## 2011-11-14 ENCOUNTER — Ambulatory Visit: Payer: PRIVATE HEALTH INSURANCE | Admitting: Nurse Practitioner

## 2011-11-16 ENCOUNTER — Ambulatory Visit: Payer: PRIVATE HEALTH INSURANCE | Admitting: Nurse Practitioner

## 2011-11-20 ENCOUNTER — Other Ambulatory Visit: Payer: Self-pay

## 2011-11-20 DIAGNOSIS — I2510 Atherosclerotic heart disease of native coronary artery without angina pectoris: Secondary | ICD-10-CM

## 2011-12-04 ENCOUNTER — Other Ambulatory Visit: Payer: Medicare Other

## 2011-12-04 ENCOUNTER — Ambulatory Visit: Payer: PRIVATE HEALTH INSURANCE | Admitting: Nurse Practitioner

## 2011-12-13 ENCOUNTER — Other Ambulatory Visit (INDEPENDENT_AMBULATORY_CARE_PROVIDER_SITE_OTHER): Payer: Medicare Other

## 2011-12-13 ENCOUNTER — Ambulatory Visit (HOSPITAL_COMMUNITY): Payer: PRIVATE HEALTH INSURANCE | Attending: Cardiovascular Disease

## 2011-12-13 ENCOUNTER — Ambulatory Visit (INDEPENDENT_AMBULATORY_CARE_PROVIDER_SITE_OTHER): Payer: Medicare Other | Admitting: Nurse Practitioner

## 2011-12-13 ENCOUNTER — Encounter: Payer: Self-pay | Admitting: Nurse Practitioner

## 2011-12-13 ENCOUNTER — Other Ambulatory Visit: Payer: Self-pay

## 2011-12-13 VITALS — BP 142/80 | HR 53 | Ht 68.0 in | Wt 153.0 lb

## 2011-12-13 DIAGNOSIS — Z91199 Patient's noncompliance with other medical treatment and regimen due to unspecified reason: Secondary | ICD-10-CM

## 2011-12-13 DIAGNOSIS — I447 Left bundle-branch block, unspecified: Secondary | ICD-10-CM | POA: Insufficient documentation

## 2011-12-13 DIAGNOSIS — I2510 Atherosclerotic heart disease of native coronary artery without angina pectoris: Secondary | ICD-10-CM

## 2011-12-13 DIAGNOSIS — Z720 Tobacco use: Secondary | ICD-10-CM

## 2011-12-13 DIAGNOSIS — I10 Essential (primary) hypertension: Secondary | ICD-10-CM

## 2011-12-13 DIAGNOSIS — I080 Rheumatic disorders of both mitral and aortic valves: Secondary | ICD-10-CM | POA: Insufficient documentation

## 2011-12-13 DIAGNOSIS — F17200 Nicotine dependence, unspecified, uncomplicated: Secondary | ICD-10-CM

## 2011-12-13 DIAGNOSIS — Z9119 Patient's noncompliance with other medical treatment and regimen: Secondary | ICD-10-CM

## 2011-12-13 DIAGNOSIS — I519 Heart disease, unspecified: Secondary | ICD-10-CM

## 2011-12-13 DIAGNOSIS — R0989 Other specified symptoms and signs involving the circulatory and respiratory systems: Secondary | ICD-10-CM | POA: Insufficient documentation

## 2011-12-13 DIAGNOSIS — R0609 Other forms of dyspnea: Secondary | ICD-10-CM | POA: Insufficient documentation

## 2011-12-13 DIAGNOSIS — E785 Hyperlipidemia, unspecified: Secondary | ICD-10-CM | POA: Insufficient documentation

## 2011-12-13 HISTORY — DX: Heart disease, unspecified: I51.9

## 2011-12-13 LAB — BASIC METABOLIC PANEL
BUN: 7 mg/dL (ref 6–23)
CO2: 29 mEq/L (ref 19–32)
Calcium: 8.8 mg/dL (ref 8.4–10.5)
GFR: 129.1 mL/min (ref >60.00)
Glucose, Bld: 78 mg/dL (ref 70–99)
Sodium: 141 mEq/L (ref 135–145)

## 2011-12-13 LAB — URINALYSIS, ROUTINE W REFLEX MICROSCOPIC
Bilirubin Urine: NEGATIVE
Hgb urine dipstick: NEGATIVE
Ketones, ur: NEGATIVE
Leukocytes, UA: NEGATIVE
Nitrite: NEGATIVE
Specific Gravity, Urine: 1.02 (ref 1.000–1.030)
Total Protein, Urine: NEGATIVE
Urine Glucose: NEGATIVE
Urobilinogen, UA: 0.2 (ref 0.0–1.0)
pH: 6 (ref 5.0–8.0)

## 2011-12-13 MED ORDER — HYDRALAZINE HCL 25 MG PO TABS: 25.0000 mg | ORAL_TABLET | Freq: Two times a day (BID) | ORAL | Status: DC

## 2011-12-13 NOTE — Assessment & Plan Note (Signed)
Patient presents back for follow up today. Blood pressure really no different. Dr. Swaziland had suggested Hydralazine at his last visit. [name] says that makes him terribly sick. He is not a candidate for beta blockers due to bradycardia. He has failed on ACE and ARB in the past. He is on max dose of Norvasc. We will see what the echo shows. Will ask Dr. Swaziland for his input. Not sure we have much to offer [name] that he will be willing to accept. Will tentatively see him back in 3 months. We will be in touch regarding his echo results in the next couple of days. Patient is agreeable to this plan and will call if any problems develop in the interim.

## 2011-12-13 NOTE — Assessment & Plan Note (Signed)
Echo results are pending

## 2011-12-13 NOTE — Assessment & Plan Note (Signed)
He has reported multiple intolerances. Will not take Hydralazine. Already thinks his current medicines are causing his back pain. I have asked him to stay on course with his current regimen.

## 2011-12-13 NOTE — Progress Notes (Signed)
   [name] Date of Birth: Jun 30, 1941 Medical Record #[id number]  History of Present Illness: [name] is seen back today for a follow up visit. He is seen for Dr. Swaziland. He has a history of HTN, noncompliance, HLD and known CAD. He has ongoing tobacco abuse. He was here back in January and had medicines restarted for his blood pressure. Myoview was ordered. He has had a reduction in his EF with no ischemia. Echo was done earlier today to reassess his EF.  He comes in today. He is here alone. He says he is doing ok. Still smoking. No chest pain. Says his breathing is ok. Does not check his blood pressure at home. Says his cuff doesn't work right. Salt use is questionable. He does complain of back pain and thinks it is due to his medicines. Sounds more like arthritis-like since he has to get up and walk around. He thinks he may have had some recent hematuria noted.   Current Outpatient Prescriptions on File Prior to Visit  Medication Sig Dispense Refill  . amLODipine (NORVASC) 10 MG tablet Take 1 tablet (10 mg total) by mouth daily.  30 tablet  11  . atorvastatin (LIPITOR) 10 MG tablet Take 1 tablet (10 mg total) by mouth daily.  30 tablet  11  . clopidogrel (PLAVIX) 75 MG tablet Take 1 tablet (75 mg total) by mouth daily.  30 tablet  11  . hydrochlorothiazide (HYDRODIURIL) 25 MG tablet Take 1 tablet (25 mg total) by mouth daily.  30 tablet  11  . nitroGLYCERIN (NITROSTAT) 0.4 MG SL tablet Place 1 tablet (0.4 mg total) under the tongue every 5 (five) minutes as needed for chest pain.  100 tablet  3  . potassium chloride SA (K-DUR,KLOR-CON) 20 MEQ tablet Take 1 tablet (20 mEq total) by mouth 2 (two) times daily.  60 tablet  11    Allergies  Allergen Reactions  . Aspirin     Past Medical History  Diagnosis Date  . Dyslipidemia   . Cellulitis     Right forearm cellulitis  . LBBB (left bundle branch block)   . CAD (coronary artery disease)     s/p stent RCA DES 9/09; s/p Myoview 2013  with reduced EF to 40%; no ischemia  . HTN (hypertension)   . Noncompliance     Past Surgical History  Procedure Date  . Cardiac catheterization     ejection fraction 50%    History  Smoking status  . Former Smoker  Smokeless tobacco  . Not on file    History  Alcohol Use No    History reviewed. No pertinent family history.  Review of Systems: The review of systems is per the HPI.  All other systems were reviewed and are negative.  Physical Exam: BP 142/80  Pulse 53  Ht 5\' 8"  (1.727 m)  Wt 153 lb (69.4 kg)  BMI 23.26 kg/m2  SpO2 99% Patient is pleasant and in no acute distress. Skin is warm and dry. Color is normal.  HEENT is unremarkable. Normocephalic/atraumatic. PERRL. Sclera are nonicteric. Neck is supple. No masses. No JVD. Lungs are clear. Cardiac exam shows a regular rate and rhythm. He has a soft systolic murmur. Abdomen is soft. Extremities are without edema. Gait and ROM are intact. No gross neurologic deficits noted.   LABORATORY DATA: ECHO and BMET results are pending.    Assessment / Plan:

## 2011-12-13 NOTE — Assessment & Plan Note (Signed)
>>  ASSESSMENT AND PLAN FOR CORONARY ARTERY DISEASE INVOLVING NATIVE CORONARY ARTERY OF NATIVE HEART WITHOUT ANGINA PECTORIS WRITTEN ON 06/12/2023  7:15 AM BY Santiago Graf, DO  >>ASSESSMENT AND PLAN FOR CAD (CORONARY ARTERY DISEASE) WRITTEN ON 12/13/2011 10:31 AM BY GERHARDT, LORI C, NP  No chest pain reported. He does remain on chronic Plavix.

## 2011-12-13 NOTE — Assessment & Plan Note (Signed)
Cessation is encouraged

## 2011-12-13 NOTE — Patient Instructions (Addendum)
We will call you with the results of your ultrasound from later today  I will talk with Dr. Swaziland regarding your medicines  We are going to check some labs today and a urine sample  Try to stop smoking  Dr. Swaziland will see you in 3 months  Call the [facility] office at [phone] if you have any questions, problems or concerns.

## 2011-12-13 NOTE — Assessment & Plan Note (Signed)
>>  ASSESSMENT AND PLAN FOR CAD (CORONARY ARTERY DISEASE) WRITTEN ON 12/13/2011 10:31 AM BY GERHARDT, LORI C, NP  No chest pain reported. He does remain on chronic Plavix.

## 2011-12-13 NOTE — Assessment & Plan Note (Signed)
No chest pain reported. He does remain on chronic Plavix.

## 2011-12-14 ENCOUNTER — Other Ambulatory Visit: Payer: Self-pay

## 2011-12-14 ENCOUNTER — Telehealth: Payer: Self-pay | Admitting: Cardiology

## 2011-12-14 MED ORDER — POTASSIUM CHLORIDE CRYS ER 20 MEQ PO TBCR: EXTENDED_RELEASE_TABLET | ORAL | Status: DC

## 2011-12-14 NOTE — Telephone Encounter (Signed)
Patient called echo and lab results given.Patient advised to take B/P medication.

## 2011-12-14 NOTE — Telephone Encounter (Signed)
New Msg: Pt calling wanting to speak with nurse/MD. Please return pt call after 3 pm to discuss further.   PT WORKS THIRD SHIFT AND REQUEST THAT WE NOT CALL HIM BEFORE 3 PM.

## 2011-12-14 NOTE — Telephone Encounter (Signed)
Fu call °Pt returning your call  °

## 2011-12-17 ENCOUNTER — Telehealth: Payer: Self-pay

## 2011-12-17 NOTE — Telephone Encounter (Signed)
Patient was called back and told spoke to Norma Fredrickson NP and told her unable to take HCTZ.She advised make appointment in 1 month to see her and keep diary of B/P readings,bring all medications and list of meds cannot take to appointment. [name]

## 2012-01-16 ENCOUNTER — Encounter: Payer: Self-pay | Admitting: Nurse Practitioner

## 2012-01-16 ENCOUNTER — Ambulatory Visit (INDEPENDENT_AMBULATORY_CARE_PROVIDER_SITE_OTHER): Payer: Medicare Other | Admitting: Nurse Practitioner

## 2012-01-16 VITALS — BP 142/82 | HR 65 | Ht 68.0 in | Wt 155.8 lb

## 2012-01-16 DIAGNOSIS — F17200 Nicotine dependence, unspecified, uncomplicated: Secondary | ICD-10-CM

## 2012-01-16 DIAGNOSIS — I519 Heart disease, unspecified: Secondary | ICD-10-CM

## 2012-01-16 DIAGNOSIS — Z720 Tobacco use: Secondary | ICD-10-CM

## 2012-01-16 DIAGNOSIS — Z9119 Patient's noncompliance with other medical treatment and regimen: Secondary | ICD-10-CM

## 2012-01-16 DIAGNOSIS — I10 Essential (primary) hypertension: Secondary | ICD-10-CM

## 2012-01-16 NOTE — Assessment & Plan Note (Signed)
I have stressed the importance of him taking his medicines.

## 2012-01-16 NOTE — Assessment & Plan Note (Signed)
EF is about 40 to 45%. He has failed on ARB and ACE in the past apparently. He is not a candidate for beta blockers given his bradycardia.

## 2012-01-16 NOTE — Assessment & Plan Note (Signed)
Smoking cessation is once again encouraged. He is not ready to quit at this time.

## 2012-01-16 NOTE — Patient Instructions (Signed)
Your blood pressure at home looks ok.Continue to monitor and keep a diary  Keep taking your medicines  Try to work on stop smoking  We will arrange to get you a new primary care doctor.  We will see you back in 4 months.

## 2012-01-16 NOTE — Assessment & Plan Note (Signed)
His blood pressure readings from home are reviewed and are satisfactory. He is ranging between 115 and 140. I have left him on his current regimen. He does have some LV dysfunction noted now on recent echo but has failed on both ACE and ARB in the past. He is not a candidate for beta blockers due to resting bradycardia. We are going to see him back in about 4 months. I have asked him to continue to monitor his blood pressure at home. Patient is agreeable to this plan and will call if any problems develop in the interim.

## 2012-01-16 NOTE — Progress Notes (Signed)
[name] Date of Birth: April 25, 1941 Medical Record #[id number]  History of Present Illness: [name] is seen back today for a one month check. He is seen for Dr. Swaziland. He has multiple medical issues which include HTN, noncompliance, HLD and known CAD. Has ongoing tobacco abuse. Now with LV dysfunction. No ischemia on recent Myoview noted. He says he is intolerant to Hydralazine. Failed on ACE and ARB in the past. Not able to take beta blockers due to his resting bradycardia.   He comes in today. He is here alone. Says he is doing ok. Feels a little tired and sluggish at times. Some palpitations. No syncope. Still smoking some. No chest pain. Not short of breath. He is complaining more of some back and hip pain. No longer has a PCP and is asking today to try and get established. Says he is taking his medicines. No NTG use. No swelling reported. His blood pressure readings from home are reviewed and are satisfactory.  Current Outpatient Prescriptions on File Prior to Visit  Medication Sig Dispense Refill  . amLODipine (NORVASC) 10 MG tablet Take 1 tablet (10 mg total) by mouth daily.  30 tablet  11  . atorvastatin (LIPITOR) 10 MG tablet Take 1 tablet (10 mg total) by mouth daily.  30 tablet  11  . clopidogrel (PLAVIX) 75 MG tablet Take 1 tablet (75 mg total) by mouth daily.  30 tablet  11  . hydrochlorothiazide (HYDRODIURIL) 25 MG tablet Take 1 tablet (25 mg total) by mouth daily.  30 tablet  11  . nitroGLYCERIN (NITROSTAT) 0.4 MG SL tablet Place 1 tablet (0.4 mg total) under the tongue every 5 (five) minutes as needed for chest pain.  100 tablet  3  . potassium chloride SA (K-DUR,KLOR-CON) 20 MEQ tablet Take 2 tablets twice daily.  120 tablet  6    Allergies  Allergen Reactions  . Aspirin     Past Medical History  Diagnosis Date  . Dyslipidemia   . Cellulitis     Right forearm cellulitis  . LBBB (left bundle branch block)   . CAD (coronary artery disease)     s/p stent RCA DES  9/09; s/p Myoview 2013 with reduced EF to 40%; no ischemia  . HTN (hypertension)     Reports intolerance to hydralazine; not a candidate for beta blockers due to bradycardia; failed on ACE and ARB  . Noncompliance   . Tobacco abuse     Past Surgical History  Procedure Date  . Cardiac catheterization     ejection fraction 50%    History  Smoking status  . Former Smoker  Smokeless tobacco  . Not on file    History  Alcohol Use No    History reviewed. No pertinent family history.  Review of Systems: The review of systems is positive for back and hip pain.  All other systems were reviewed and are negative.  Physical Exam: BP 142/82  Pulse 65  Ht 5\' 8"  (1.727 m)  Wt 155 lb 12.8 oz (70.67 kg)  BMI 23.69 kg/m2 Patient is pleasant and in no acute distress. Skin is warm and dry. Color is normal.  HEENT is unremarkable. Normocephalic/atraumatic. PERRL. Sclera are nonicteric. Neck is supple. No masses. No JVD. Lungs are clear. Cardiac exam shows a regular rate and rhythm. Abdomen is soft. Extremities are without edema. Gait and ROM are intact. No gross neurologic deficits noted.   LABORATORY DATA:  Echo Study Conclusions  - Left ventricle: The cavity  size was normal. There was severe concentric hypertrophy. Systolic function was mildly to moderately reduced. The estimated ejection fraction was 45%, in the range of 40% to 45%. Diffuse hypokinesis. Septal-lateral dyssynchrony consistent with LBBB. Doppler parameters are consistent with abnormal left ventricular relaxation (grade 1 diastolic dysfunction). - Aortic valve: Trivial regurgitation. - Mitral valve: Mild regurgitation. - Right ventricle: The cavity size was normal. Systolic function was mildly reduced. - Pulmonary arteries: No complete TR doppler jet so unable to estimate PA systolic pressure. - Systemic veins: IVC measured 2.2 cm with some respirophasic variation, suggesting RA pressure 10 mmHg. Impressions:  -  Normal LV size with severe LV hypertrophy. Septal-lateral dyssynchrony consistent with LBBB. EF 40-45% with diffuse hypokinesis. Normal RV size with mild systolic dysfunction. No significant valvular dysfunction.   Myoview Overall Impression (March 2013): Myoview scan with normal perfusion. LVEF 40%. Consider echo if not done to reevaluate LV systolic function.   Assessment / Plan:

## 2012-03-14 ENCOUNTER — Ambulatory Visit: Payer: Medicare Other | Admitting: Cardiology

## 2012-05-21 ENCOUNTER — Encounter: Payer: Self-pay | Admitting: Cardiology

## 2012-05-21 ENCOUNTER — Ambulatory Visit (INDEPENDENT_AMBULATORY_CARE_PROVIDER_SITE_OTHER): Payer: Medicare Other | Admitting: Cardiology

## 2012-05-21 VITALS — BP 148/70 | HR 70 | Ht 68.5 in | Wt 153.8 lb

## 2012-05-21 DIAGNOSIS — F17200 Nicotine dependence, unspecified, uncomplicated: Secondary | ICD-10-CM

## 2012-05-21 DIAGNOSIS — I519 Heart disease, unspecified: Secondary | ICD-10-CM

## 2012-05-21 DIAGNOSIS — Z720 Tobacco use: Secondary | ICD-10-CM

## 2012-05-21 DIAGNOSIS — I2510 Atherosclerotic heart disease of native coronary artery without angina pectoris: Secondary | ICD-10-CM

## 2012-05-21 DIAGNOSIS — I10 Essential (primary) hypertension: Secondary | ICD-10-CM

## 2012-05-21 DIAGNOSIS — E785 Hyperlipidemia, unspecified: Secondary | ICD-10-CM

## 2012-05-21 MED ORDER — NITROGLYCERIN 0.4 MG SL SUBL: 0.4000 mg | SUBLINGUAL_TABLET | SUBLINGUAL | Status: DC | PRN

## 2012-05-21 NOTE — Patient Instructions (Signed)
Continue your current therapy.  I will see you again in 6 months with lab work
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
## 2012-07-14 ENCOUNTER — Ambulatory Visit (INDEPENDENT_AMBULATORY_CARE_PROVIDER_SITE_OTHER): Payer: Medicare Other | Admitting: Internal Medicine

## 2012-07-14 ENCOUNTER — Ambulatory Visit (INDEPENDENT_AMBULATORY_CARE_PROVIDER_SITE_OTHER)
Admission: RE | Admit: 2012-07-14 | Discharge: 2012-07-14 | Disposition: A | Discharge status: Home / Self Care | Payer: Medicare Other | Source: Ambulatory Visit | Attending: Internal Medicine | Admitting: Internal Medicine

## 2012-07-14 ENCOUNTER — Encounter: Payer: Self-pay | Admitting: Internal Medicine

## 2012-07-14 VITALS — BP 172/80 | HR 59 | Temp 99.0°F | Ht 68.0 in | Wt 154.0 lb

## 2012-07-14 DIAGNOSIS — M545 Low back pain: Secondary | ICD-10-CM

## 2012-07-14 DIAGNOSIS — I2510 Atherosclerotic heart disease of native coronary artery without angina pectoris: Secondary | ICD-10-CM

## 2012-07-14 DIAGNOSIS — R5381 Other malaise: Secondary | ICD-10-CM

## 2012-07-14 DIAGNOSIS — Z720 Tobacco use: Secondary | ICD-10-CM

## 2012-07-14 DIAGNOSIS — I10 Essential (primary) hypertension: Secondary | ICD-10-CM

## 2012-07-14 DIAGNOSIS — E785 Hyperlipidemia, unspecified: Secondary | ICD-10-CM

## 2012-07-14 DIAGNOSIS — F17200 Nicotine dependence, unspecified, uncomplicated: Secondary | ICD-10-CM

## 2012-07-14 DIAGNOSIS — R5383 Other fatigue: Secondary | ICD-10-CM

## 2012-07-14 MED ORDER — ACETAMINOPHEN 500 MG PO TABS: 500.0000 mg | ORAL_TABLET | Freq: Four times a day (QID) | ORAL | Status: DC | PRN

## 2012-07-14 MED ORDER — VARENICLINE TARTRATE 0.5 MG PO TABS: 0.5000 mg | ORAL_TABLET | Freq: Two times a day (BID) | ORAL | Status: DC

## 2012-07-14 NOTE — Assessment & Plan Note (Signed)
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

## 2012-07-14 NOTE — Assessment & Plan Note (Signed)
DES to RCA 05/2008 - no anginal symptoms but chronic dyspnea on exertion Myoview 11/2011: no ischemia Follows with cards for same - no ASA due to allergy (stomach upset per pt)

## 2012-07-14 NOTE — Assessment & Plan Note (Signed)
>>  ASSESSMENT AND PLAN FOR CAD (CORONARY ARTERY DISEASE) WRITTEN ON 07/14/2012  9:52 AM BY Rene Paci A, MD  DES to RCA 05/2008 - no anginal symptoms but chronic dyspnea on exertion Myoview 11/2011: no ischemia Follows with cards for same - no ASA due to allergy (stomach upset per pt)

## 2012-07-14 NOTE — Assessment & Plan Note (Signed)
>>  ASSESSMENT AND PLAN FOR CORONARY ARTERY DISEASE INVOLVING NATIVE CORONARY ARTERY OF NATIVE HEART WITHOUT ANGINA PECTORIS WRITTEN ON 06/12/2023  7:15 AM BY Royce Stegman, DO  >>ASSESSMENT AND PLAN FOR CAD (CORONARY ARTERY DISEASE) WRITTEN ON 07/14/2012  9:52 AM BY LESCHBER, VALERIE A, MD  DES to RCA 05/2008 - no anginal symptoms but chronic dyspnea on exertion Myoview 11/2011: no ischemia Follows with cards for same - no ASA due to allergy (stomach upset per pt)

## 2012-07-14 NOTE — Progress Notes (Signed)
Subjective:    Patient ID: [name], male    DOB: January 08, 1941, 71 y.o.   MRN: [id number]  HPI  New patient to me and our division, here to establish with primary care provider Follows with Dr. [place] in cardiology division Reviewed chronic medical issues today:  Coronary artery disease. Status post stent (DES) September 2009 to RCA. Myoview March 2013 without ischemia - takes antiplatelet therapy Plavix only because of aspirin allergy -the patient reports compliance with medication(s) as prescribed. Denies adverse side effects.  Dyslipidemia. On statin for same. - the patient reports compliance with medication(s) as prescribed. Denies adverse side effects.  hypertension - history of variable compliance. Denies chest pain, change in chronic dyspnea on exertion, or edema  Past Medical History  Diagnosis Date  . Dyslipidemia     on statin  . LBBB (left bundle branch block)   . CAD (coronary artery disease)     s/p stent RCA DES 9/09; s/p Myoview 2013 with reduced EF to 40%; no ischemia  . HTN (hypertension)     Reports intolerance to hydralazine; not a candidate for beta blockers due to bradycardia; failed on ACE and ARB  . Noncompliance   . LV dysfunction     EF about 40 to 45% per echo in April 2013  . LVH (left ventricular hypertrophy)    History reviewed. No pertinent family history. History  Substance Use Topics  . Smoking status: Current Some Day Smoker  . 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 Alcohol Use: No    Review of Systems Constitutional: Negative for fever or weight change. complains of fatigue x 3 mo Respiratory: Negative for cough and shortness of breath.   Cardiovascular: Negative for chest pain or palpitations.  Gastrointestinal: Negative for abdominal pain, no bowel changes.  Musculoskeletal: complains of L low back pain >12 mo - worse in AM getting out of bed or after inactivity >1hour.  Negative for gait problem or joint swelling.  Skin: Negative for rash.  Neurological: Negative for dizziness or headache.  No other specific complaints in a complete review of systems (except as listed in HPI above).     Objective:   Physical Exam BP 172/80  Pulse 59  Temp 99 F (37.2 C) (Oral)  Ht 5\' 8"  (1.727 m)  Wt 154 lb (69.854 kg)  BMI 23.42 kg/m2  SpO2 99% Wt Readings from Last 3 Encounters:  07/14/12 154 lb (69.854 kg)  05/21/12 153 lb 12.8 oz (69.763 kg)  01/16/12 155 lb 12.8 oz (70.67 kg)   Constitutional: he appears well-developed and well-nourished. No distress.  HENT: Head: Normocephalic and atraumatic. Ears: B TMs ok, no erythema or effusion; Nose: Nose normal. Mouth/Throat: Oropharynx is clear and moist. No oropharyngeal exudate.  Eyes: Conjunctivae and EOM are normal. Pupils are equal, round, and reactive to light. No scleral icterus.  Neck: Normal range of motion. Neck supple. No JVD present. No thyromegaly present.  Cardiovascular: Normal rate, regular rhythm and normal heart sounds.  No murmur heard. No BLE edema. Pulmonary/Chest: Effort normal and breath sounds normal. No respiratory distress. he has no wheezes.  Abdominal: Soft. Bowel sounds are normal. he exhibits no distension. There is no tenderness. no masses Musculoskeletal: Back: full range of motion of thoracic and lumbar spine. Non tender to palpation. Negative straight leg raise. DTR's are symmetrically intact. Sensation intact in all dermatomes of the lower extremities. Full strength  to manual muscle testing. patient is able to heel toe walk without difficulty and ambulates with antalgic gait. Normal range of motion, no joint effusions. No gross deformities Neurological: he is alert and oriented to person, place, and time. No cranial nerve deficit. Coordination normal.  Skin: Skin is warm and dry. No rash noted. No erythema.  Psychiatric: he has a normal mood and affect. behavior is normal. Judgment and  thought content normal.   Lab Results  Component Value Date   WBC 5.2 11/12/2011   HGB 12.3* 11/12/2011   HCT 38.7* 11/12/2011   PLT 141.0* 11/12/2011   GLUCOSE 78 12/13/2011   CHOL 139 11/12/2011   TRIG 101.0 11/12/2011   HDL 50.80 11/12/2011   LDLCALC 68 11/12/2011   ALT 8 11/12/2011   AST 14 11/12/2011   NA 141 12/13/2011   K 3.3* 12/13/2011   CL 105 12/13/2011   CREATININE 0.8 12/13/2011   BUN 7 12/13/2011   CO2 29 12/13/2011   TSH 4.182 *Test methodology is 3rd generation TSH* 05/24/2008   INR 1.01 07/19/2010       Assessment & Plan:   See problem list. Medications and labs reviewed today.  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  Time spent with pt today 45 minutes, greater than 50% time spent counseling patient on CAD, lipids, hypertension and medication review. Also back pain, fatigue and review of prior records

## 2012-07-14 NOTE — Assessment & Plan Note (Signed)
There have been some probable medication, dietary and lifestyle compliance issues here. I have discussed with him the great importance of following the treatment plan exactly as directed in order to achieve a good medical outcome.  BP Readings from Last 3 Encounters:  07/14/12 172/80  05/21/12 148/70  01/16/12 142/82

## 2012-07-14 NOTE — Patient Instructions (Signed)
It was good to see you today. We have reviewed your prior records including labs and tests today Test(s) ordered today. Your results will be released to MyChart (or called to you) after review, usually within 72hours after test completion. If any changes need to be made, you will be notified at that same time. Use tylenol 500mg  every night at bedtime and then as needed during day for back pain, headache or other osteoarthritis pain Start chantix as discussed doe smoking cessation - Your prescription(s) have been submitted to your pharmacy. Please take as directed and contact our office if you believe you are having problem(s) with the medication(s). Other Medications reviewed, no changes at this time. Please schedule followup in 6 months, call sooner if problems.  Back Exercises Back exercises help treat and prevent back injuries. The goal of back exercises is to increase the strength of your abdominal and back muscles and the flexibility of your back. These exercises should be started when you no longer have back pain. Back exercises include:  Pelvic Tilt. Lie on your back with your knees bent. Tilt your pelvis until the lower part of your back is against the floor. Hold this position 5 to 10 sec and repeat 5 to 10 times.   Knee to Chest. Pull first 1 knee up against your chest and hold for 20 to 30 seconds, repeat this with the other knee, and then both knees. This may be done with the other leg straight or bent, whichever feels better.   Sit-Ups or Curl-Ups. Bend your knees 90 degrees. Start with tilting your pelvis, and do a partial, slow sit-up, lifting your trunk only 30 to 45 degrees off the floor. Take at least 2 to 3 seconds for each sit-up. Do not do sit-ups with your knees out straight. If partial sit-ups are difficult, simply do the above but with only tightening your abdominal muscles and holding it as directed.   Hip-Lift. Lie on your back with your knees flexed 90 degrees. Push down  with your feet and shoulders as you raise your hips a couple inches off the floor; hold for 10 seconds, repeat 5 to 10 times.   Back arches. Lie on your stomach, propping yourself up on bent elbows. Slowly press on your hands, causing an arch in your low back. Repeat 3 to 5 times. Any initial stiffness and discomfort should lessen with repetition over time.   Shoulder-Lifts. Lie face down with arms beside your body. Keep hips and torso pressed to floor as you slowly lift your head and shoulders off the floor.  Do not overdo your exercises, especially in the beginning. Exercises may cause you some mild back discomfort which lasts for a few minutes; however, if the pain is more severe, or lasts for more than 15 minutes, do not continue exercises until you see your caregiver. Improvement with exercise therapy for back problems is slow.   See your caregivers for assistance with developing a proper back exercise program. Document Released: 10/04/2004 Document Revised: 11/19/2011 Document Reviewed: 06/28/2011 [facility] Patient Information 2013 [place], [place].   You Can Quit Smoking If you are ready to quit smoking or are thinking about it, congratulations! You have chosen to help yourself be healthier and live longer! There are lots of different ways to quit smoking. Nicotine gum, nicotine patches, a nicotine inhaler, or nicotine nasal spray can help with physical craving. Hypnosis, support groups, and medicines help break the habit of smoking. TIPS TO GET OFF AND STAY OFF  CIGARETTES  Learn to predict your moods. Do not let a bad situation be your excuse to have a cigarette. Some situations in your life might tempt you to have a cigarette.   Ask friends and co-workers not to smoke around you.   Make your home smoke-free.   Never have "just one" cigarette. It leads to wanting another and another. Remind yourself of your decision to quit.   On a card, make a list of your reasons for not smoking. Read it  at least the same number of times a day as you have a cigarette. Tell yourself everyday, "I do not want to smoke. I choose not to smoke."   Ask someone at home or work to help you with your plan to quit smoking.   Have something planned after you eat or have a cup of coffee. Take a walk or get other exercise to perk you up. This will help to keep you from overeating.   Try a relaxation exercise to calm you down and decrease your stress. Remember, you may be tense and nervous the first two weeks after you quit. This will pass.   Find new activities to keep your hands busy. Play with a pen, coin, or rubber band. Doodle or draw things on paper.   Brush your teeth right after eating. This will help cut down the craving for the taste of tobacco after meals. You can try mouthwash too.   Try gum, breath mints, or diet candy to keep something in your mouth.  IF YOU SMOKE AND WANT TO QUIT:  Do not stock up on cigarettes. Never buy a carton. Wait until one pack is finished before you buy another.   Never carry cigarettes with you at work or at home.   Keep cigarettes as far away from you as possible. Leave them with someone else.   Never carry matches or a lighter with you.   Ask yourself, "Do I need this cigarette or is this just a reflex?"   Bet with someone that you can quit. Put cigarette money in a piggy bank every morning. If you smoke, you give up the money. If you do not smoke, by the end of the week, you keep the money.   Keep trying. It takes 21 days to change a habit!   Talk to your doctor about using medicines to help you quit. These include nicotine replacement gum, lozenges, or skin patches.  Document Released: 06/23/2009 Document Revised: 11/19/2011 Document Reviewed: 06/23/2009 [facility] Patient Information 2013 [place], [place].

## 2012-07-14 NOTE — Assessment & Plan Note (Signed)
On statin - The current medical regimen is effective;  continue present plan and medications.  

## 2012-09-02 ENCOUNTER — Other Ambulatory Visit: Payer: Self-pay | Admitting: Internal Medicine

## 2012-09-30 ENCOUNTER — Encounter (HOSPITAL_COMMUNITY): Payer: Self-pay | Admitting: *Deleted

## 2012-09-30 ENCOUNTER — Emergency Department (INDEPENDENT_AMBULATORY_CARE_PROVIDER_SITE_OTHER)
Admission: EM | Admit: 2012-09-30 | Discharge: 2012-09-30 | Disposition: A | Discharge status: Home / Self Care | Payer: Medicare Other | Source: Home / Self Care | Attending: Emergency Medicine | Admitting: Emergency Medicine

## 2012-09-30 DIAGNOSIS — K122 Cellulitis and abscess of mouth: Secondary | ICD-10-CM

## 2012-09-30 MED ORDER — PENICILLIN V POTASSIUM 500 MG PO TABS: 500.0000 mg | ORAL_TABLET | Freq: Three times a day (TID) | ORAL | Status: AC

## 2012-09-30 MED ORDER — CLINDAMYCIN HCL 150 MG PO CAPS: 150.0000 mg | ORAL_CAPSULE | Freq: Three times a day (TID) | ORAL | Status: AC

## 2012-09-30 NOTE — ED Notes (Signed)
C/O painful upper front gums x 2 days; started with swollen upper gums yesterday.  C/O difficulty sleeping due to pain; taking 200mg  IBU with some relief.  Has felt feverish, but unsure if any fevers.  Has not taken HTN meds today.

## 2012-09-30 NOTE — ED Provider Notes (Signed)
History     CSN: [id number]  Arrival date & time 09/30/12  1815   First MD Initiated Contact with Patient 09/30/12 1817      Chief Complaint  Patient presents with  . Oral Swelling    (Consider location/radiation/quality/duration/timing/severity/associated sxs/prior treatment) HPI Comments: Patient presents urgent care this evening complaining of painful upper front gum swelling and throbbing pain that started about 2 days ago. Noticed this morning his upper lip to be swollen been taking some ibuprofen with some partial relief. One point he felt he might have had fevers ( tactile). Denies any headache, no difficulty swallowing, no further symptoms. Constant throbbing pain coming from his front upper teeth. Patient describes that he had an initial partial- 2 removal performed by oral-surgeon.   The history is provided by the patient.    Past Medical History  Diagnosis Date  . Dyslipidemia     on statin  . LBBB (left bundle branch block)   . CAD (coronary artery disease)     s/p stent RCA DES 9/09; s/p Myoview 2013 with reduced EF to 40%; no ischemia  . HTN (hypertension)     Reports intolerance to hydralazine; not a candidate for beta blockers due to bradycardia; failed on ACE and ARB  . Noncompliance   . LV dysfunction     EF about 40 to 45% per echo in April 2013  . LVH (left ventricular hypertrophy)     Past Surgical History  Procedure Date  . Cardiac catheterization     ejection fraction 50%    No family history on file.  History  Substance Use Topics  . Smoking status: Current Some Day Smoker  . 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 Alcohol Use: No      Review of Systems  Constitutional: Negative for chills, activity change and appetite change.  HENT: Positive for dental problem. Negative for nosebleeds, congestion, facial swelling, drooling, neck pain and ear discharge.   Gastrointestinal:  Negative for nausea.  Skin: Negative for pallor and rash.  Neurological: Negative for dizziness and headaches.    Allergies  Aspirin  Home Medications   Current Outpatient Rx  Name  Route  Sig  Dispense  Refill  . AMLODIPINE BESYLATE 10 MG PO TABS   Oral   Take 1 tablet (10 mg total) by mouth daily.   30 tablet   11     This patient needs an office visit before further  ...   . ATORVASTATIN CALCIUM 10 MG PO TABS   Oral   Take 1 tablet (10 mg total) by mouth daily.   30 tablet   11   . CLOPIDOGREL BISULFATE 75 MG PO TABS   Oral   Take 1 tablet (75 mg total) by mouth daily.   30 tablet   11     NEEDS TO SCHEDULE AN OFFICE VISIT WITH DR.JORDAN O ...   . HYDROCHLOROTHIAZIDE 25 MG PO TABS   Oral   Take 1 tablet (25 mg total) by mouth daily.   30 tablet   11   . NITROGLYCERIN 0.4 MG SL SUBL   Sublingual   Place 1 tablet (0.4 mg total) under the tongue every 5 (five) minutes as needed for chest pain.   100 tablet   3   . POTASSIUM CHLORIDE CRYS ER 20 MEQ PO TBCR      Take 2 tablets twice daily.  120 tablet   6   . VARENICLINE TARTRATE 0.5 MG PO TABS   Oral   Take 1 tablet (0.5 mg total) by mouth 2 (two) times daily.   60 tablet   1   . CLINDAMYCIN HCL 150 MG PO CAPS   Oral   Take 1 capsule (150 mg total) by mouth 3 (three) times daily.   15 capsule   0   . [employer] PAIN RELIEF EXTRA STRENGTH 500 MG PO TABS      TAKE 1 TABLET BY MOUTH EVERY 6 HOURS AS NEEDED FOR PAIN AND 1 TABLET AT BEDTIME FOR BACK PAIN   30 tablet   0   . PENICILLIN V POTASSIUM 500 MG PO TABS   Oral   Take 1 tablet (500 mg total) by mouth 3 (three) times daily.   15 tablet   0     BP 163/93  Pulse 75  Temp 98.2 F (36.8 C) (Oral)  Resp 18  SpO2 99%  Physical Exam  Constitutional: Vital signs are normal. He appears well-developed and well-nourished.  Non-toxic appearance. He does not have a sickly appearance. He does not appear ill. No distress.  HENT:  Mouth/Throat: Uvula  is midline.    Eyes: Conjunctivae normal are normal. No scleral icterus.  Neck: Neck supple.  Skin: No rash noted. No erythema.    ED Course  Procedures (including critical care time)  Labs Reviewed - No data to display No results found.   1. Oral abscess       MDM  Marked- poor dentition- with a dental apical abscess early formation. Patient has been consulted with an oral surgeon- had initial work unable to complete treatment you to lack of insurance. Patient has been instructed to start with both penicillin and clindamycin to call Dr. Barbette Merino tomorrow-patient has also been instructed if any fevers worsening swelling despite antibiotics and he should present himself to the emergency department for further management treatment. Patient agrees with treatment plan and follow-up care as discussed.        Jimmie Molly, MD 09/30/12 [phone]

## 2012-09-30 NOTE — ED Notes (Signed)
Decayed teeth noted throughout oral cavity.

## 2012-10-14 ENCOUNTER — Telehealth: Payer: Self-pay

## 2012-10-14 MED ORDER — POTASSIUM CHLORIDE CRYS ER 20 MEQ PO TBCR: EXTENDED_RELEASE_TABLET | ORAL | Status: DC

## 2012-10-14 MED ORDER — CLOPIDOGREL BISULFATE 75 MG PO TABS: 75.0000 mg | ORAL_TABLET | Freq: Every day | ORAL | Status: DC

## 2012-10-14 MED ORDER — HYDROCHLOROTHIAZIDE 25 MG PO TABS: 25.0000 mg | ORAL_TABLET | Freq: Every day | ORAL | Status: DC

## 2012-10-14 MED ORDER — AMLODIPINE BESYLATE 10 MG PO TABS: 10.0000 mg | ORAL_TABLET | Freq: Every day | ORAL | Status: DC

## 2012-10-14 MED ORDER — ATORVASTATIN CALCIUM 10 MG PO TABS: 10.0000 mg | ORAL_TABLET | Freq: Every day | ORAL | Status: DC

## 2012-10-14 NOTE — Telephone Encounter (Signed)
PT NEEDED REFILLS ON ALL MEDS . I SENT THE CHANTIX TO HIS PCP DOCTOR

## 2012-11-18 ENCOUNTER — Other Ambulatory Visit: Payer: Medicare Other

## 2012-12-10 ENCOUNTER — Encounter (HOSPITAL_COMMUNITY): Payer: Self-pay | Admitting: Emergency Medicine

## 2012-12-10 ENCOUNTER — Emergency Department (INDEPENDENT_AMBULATORY_CARE_PROVIDER_SITE_OTHER)
Admission: EM | Admit: 2012-12-10 | Discharge: 2012-12-10 | Disposition: A | Discharge status: Home / Self Care | Payer: Medicare Other | Source: Home / Self Care | Attending: Emergency Medicine | Admitting: Emergency Medicine

## 2012-12-10 DIAGNOSIS — K047 Periapical abscess without sinus: Secondary | ICD-10-CM

## 2012-12-10 MED ORDER — HYDROCODONE-ACETAMINOPHEN 5-325 MG PO TABS: 2.0000 | ORAL_TABLET | ORAL | Status: DC | PRN

## 2012-12-10 MED ORDER — AMOXICILLIN-POT CLAVULANATE 500-125 MG PO TABS: 1.0000 | ORAL_TABLET | Freq: Three times a day (TID) | ORAL | Status: AC

## 2012-12-10 NOTE — ED Notes (Signed)
Dental abscess, pain in right lower jaw.  Patient reports he is to have teeth pulled in may 2014 by dr Barbette Merino.

## 2012-12-10 NOTE — ED Provider Notes (Signed)
History     CSN: [id number]  Arrival date & time 12/10/12  1001   First MD Initiated Contact with Patient 12/10/12 1004      Chief Complaint  Patient presents with  . Abscess    (Consider location/radiation/quality/duration/timing/severity/associated sxs/prior treatment) HPI Comments: Patient presents urgent care this morning complaining of a new dental infection this started 2 days ago. Patient was seen in the month of January for a similar infection and recommended to see his oral surgeon at that point. Patient describes that he had attempted to be seen by his oral surgeon but they could only see him in May. Since last visit patient took prescribe antibiotics at that time with resolution of his pain and swelling. Is not in told yesterday that he started noticing throbbing pain to his right lower teeth and started noticing increase swelling. He's been taking over-the-counter ibuprofen, Tylenol and had been up applying warm compresses for swelling and pain. Patient denies any fevers or headaches.  Patient is a 72 y.o. male presenting with abscess.  Abscess Location:  Face and mouth Facial abscess location:  Face Mouth abscess location: Right lower frontal- premolar region. Abscess quality: induration and painful   Duration:  2 days Progression:  Worsening Pain details:    Quality:  Dull and pressure   Severity:  Moderate   Duration:  2 days   Timing:  Constant   Progression:  Worsening Chronicity:  Recurrent Relieved by:  Oral antibiotics and warm compresses Worsened by:  Nothing tried Associated symptoms: no anorexia, no fever, no headaches and no nausea   Risk factors: prior abscess     Past Medical History  Diagnosis Date  . Dyslipidemia     on statin  . LBBB (left bundle branch block)   . CAD (coronary artery disease)     s/p stent RCA DES 9/09; s/p Myoview 2013 with reduced EF to 40%; no ischemia  . HTN (hypertension)     Reports intolerance to hydralazine; not a  candidate for beta blockers due to bradycardia; failed on ACE and ARB  . Noncompliance   . LV dysfunction     EF about 40 to 45% per echo in April 2013  . LVH (left ventricular hypertrophy)     Past Surgical History  Procedure Laterality Date  . Cardiac catheterization      ejection fraction 50%    No family history on file.  History  Substance Use Topics  . Smoking status: Former Games developer  . 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 Alcohol Use: No      Review of Systems  Constitutional: Negative for fever and appetite change.  HENT: Negative for ear pain, sore throat and trouble swallowing.        Dental pain  Cardiovascular: Negative for chest pain.  Gastrointestinal: Negative for nausea and anorexia.  Neurological: Negative for dizziness and headaches.    Allergies  Aspirin  Home Medications   Current Outpatient Rx  Name  Route  Sig  Dispense  Refill  . amLODipine (NORVASC) 10 MG tablet   Oral   Take 1 tablet (10 mg total) by mouth daily.   30 tablet   6   . amoxicillin-clavulanate (AUGMENTIN) 500-125 MG per tablet   Oral   Take 1 tablet (500 mg total) by mouth 3 (three) times daily.   21 tablet   0   . atorvastatin (LIPITOR)  10 MG tablet   Oral   Take 1 tablet (10 mg total) by mouth daily.   30 tablet   6   . clopidogrel (PLAVIX) 75 MG tablet   Oral   Take 1 tablet (75 mg total) by mouth daily.   30 tablet   6   . [employer] PAIN RELIEF EXTRA STRENGTH 500 MG tablet      TAKE 1 TABLET BY MOUTH EVERY 6 HOURS AS NEEDED FOR PAIN AND 1 TABLET AT BEDTIME FOR BACK PAIN   30 tablet   0   . hydrochlorothiazide (HYDRODIURIL) 25 MG tablet   Oral   Take 1 tablet (25 mg total) by mouth daily.   30 tablet   6   . HYDROcodone-acetaminophen (NORCO/VICODIN) 5-325 MG per tablet   Oral   Take 2 tablets by mouth every 4 (four) hours as needed for pain.   10 tablet   0   . nitroGLYCERIN (NITROSTAT)  0.4 MG SL tablet   Sublingual   Place 1 tablet (0.4 mg total) under the tongue every 5 (five) minutes as needed for chest pain.   100 tablet   3   . potassium chloride SA (K-DUR,KLOR-CON) 20 MEQ tablet      Take 2 tablets twice daily.   120 tablet   2   . varenicline (CHANTIX) 0.5 MG tablet   Oral   Take 1 tablet (0.5 mg total) by mouth 2 (two) times daily.   60 tablet   1     BP 159/78  Pulse 71  Temp(Src) 98.1 F (36.7 C) (Oral)  Resp 18  SpO2 97%  Physical Exam  Nursing note and vitals reviewed. Constitutional: He is oriented to person, place, and time. He appears well-nourished. No distress.  HENT:  Head: No trismus in the jaw.  Mouth/Throat: Uvula is midline, oropharynx is clear and moist and mucous membranes are normal. He does not have dentures. No oral lesions. Abnormal dentition. Dental caries present. No edematous or lacerations. No oropharyngeal exudate, posterior oropharyngeal edema, posterior oropharyngeal erythema or tonsillar abscesses.    Eyes: Conjunctivae are normal. Pupils are equal, round, and reactive to light.  Neck: Neck supple.  Neurological: He is alert and oriented to person, place, and time.  Skin: No rash noted. No erythema.    ED Course  Procedures (including critical care time)  Labs Reviewed - No data to display No results found.   1. Dental abscess     Patient with recurrent oral infections/dental abscesses. Describing difficulties to continue plan of care with his oral surgeon. I have called this morning, set up a appointment to see Dr. Barbette Merino 04/14-   MDM  Patient was be started on- Augmentin and given 10 tablets of Lortab. Have been otherwise if no improvement in the next 2 days has been no significant improvement of the swelling or pain any fevers, headaches or new symptoms that he should go to the emergency department. He was also given specific information, to see his oral surgeon. I have explained to [facility]. [facility], the  complications of chronic and recurrent dental infections include osteomyelitis, brain abscesses and are HEART VALVE infections, given his multiple risk factors instructed patient to be seen in 2 days if no significant improvement. Patient understands and agrees with treatment plan close follow-up, and agrees to followup with his oral surgeon        Jimmie Molly, MD 12/10/12 1315

## 2013-01-14 ENCOUNTER — Ambulatory Visit: Payer: Medicare Other | Admitting: Internal Medicine

## 2013-01-29 ENCOUNTER — Ambulatory Visit: Payer: Medicare Other | Admitting: Cardiology

## 2013-04-10 ENCOUNTER — Other Ambulatory Visit: Payer: Self-pay | Admitting: *Deleted

## 2013-04-10 MED ORDER — AMLODIPINE BESYLATE 10 MG PO TABS: 10.0000 mg | ORAL_TABLET | Freq: Every day | ORAL | Status: DC

## 2013-04-10 MED ORDER — CLOPIDOGREL BISULFATE 75 MG PO TABS: 75.0000 mg | ORAL_TABLET | Freq: Every day | ORAL | Status: DC

## 2013-05-27 ENCOUNTER — Telehealth: Payer: Self-pay

## 2013-05-27 MED ORDER — AMLODIPINE BESYLATE 10 MG PO TABS: 10.0000 mg | ORAL_TABLET | Freq: Every day | ORAL | Status: DC

## 2013-05-27 NOTE — Telephone Encounter (Signed)
Call pharm to see if patient had a refill on his amlodipine 10 mg and the pharm said they did not have the refill for 04/10/13 so i gave a verbal order for a 90 tablets and no refills until appointment

## 2013-06-18 ENCOUNTER — Encounter: Payer: Self-pay | Admitting: Cardiology

## 2013-06-18 ENCOUNTER — Ambulatory Visit (INDEPENDENT_AMBULATORY_CARE_PROVIDER_SITE_OTHER): Payer: Medicare Other | Admitting: Cardiology

## 2013-06-18 ENCOUNTER — Telehealth: Payer: Self-pay | Admitting: Cardiology

## 2013-06-18 VITALS — BP 156/74 | HR 49 | Ht 68.0 in | Wt 148.0 lb

## 2013-06-18 DIAGNOSIS — R0609 Other forms of dyspnea: Secondary | ICD-10-CM

## 2013-06-18 DIAGNOSIS — Z720 Tobacco use: Secondary | ICD-10-CM

## 2013-06-18 DIAGNOSIS — F17200 Nicotine dependence, unspecified, uncomplicated: Secondary | ICD-10-CM

## 2013-06-18 DIAGNOSIS — I447 Left bundle-branch block, unspecified: Secondary | ICD-10-CM

## 2013-06-18 DIAGNOSIS — R5381 Other malaise: Secondary | ICD-10-CM

## 2013-06-18 DIAGNOSIS — R0600 Dyspnea, unspecified: Secondary | ICD-10-CM

## 2013-06-18 DIAGNOSIS — R351 Nocturia: Secondary | ICD-10-CM | POA: Insufficient documentation

## 2013-06-18 DIAGNOSIS — R5383 Other fatigue: Secondary | ICD-10-CM

## 2013-06-18 DIAGNOSIS — E785 Hyperlipidemia, unspecified: Secondary | ICD-10-CM

## 2013-06-18 DIAGNOSIS — I2510 Atherosclerotic heart disease of native coronary artery without angina pectoris: Secondary | ICD-10-CM

## 2013-06-18 LAB — BRAIN NATRIURETIC PEPTIDE: Pro B Natriuretic peptide (BNP): 55 pg/mL (ref 0.0–100.0)

## 2013-06-18 LAB — LIPID PANEL
Cholesterol: 208 mg/dL — ABNORMAL HIGH (ref 0–200)
Total CHOL/HDL Ratio: 4
Triglycerides: 97 mg/dL (ref 0.0–149.0)
VLDL: 19.4 mg/dL (ref 0.0–40.0)

## 2013-06-18 LAB — URINALYSIS
Bilirubin Urine: NEGATIVE
Ketones, ur: NEGATIVE
Leukocytes, UA: NEGATIVE
Specific Gravity, Urine: 1.02 (ref 1.000–1.030)
Total Protein, Urine: NEGATIVE
Urine Glucose: NEGATIVE
pH: 6 (ref 5.0–8.0)

## 2013-06-18 LAB — CBC WITH DIFFERENTIAL/PLATELET
Basophils Absolute: 0 10*3/uL (ref 0.0–0.1)
Basophils Relative: 0.6 % (ref 0.0–3.0)
Eosinophils Relative: 1.2 % (ref 0.0–5.0)
HCT: 37.2 % — ABNORMAL LOW (ref 39.0–52.0)
Hemoglobin: 12.4 g/dL — ABNORMAL LOW (ref 13.0–17.0)
Lymphs Abs: 2.4 10*3/uL (ref 0.7–4.0)
MCV: 84.2 fl (ref 78.0–100.0)
Monocytes Absolute: 0.4 10*3/uL (ref 0.1–1.0)
Monocytes Relative: 7.8 % (ref 3.0–12.0)
Platelets: 184 10*3/uL (ref 150.0–400.0)
RBC: 4.41 Mil/uL (ref 4.22–5.81)
WBC: 4.8 10*3/uL (ref 4.5–10.5)

## 2013-06-18 LAB — HEPATIC FUNCTION PANEL
AST: 17 U/L (ref 0–37)
Albumin: 4.3 g/dL (ref 3.5–5.2)
Alkaline Phosphatase: 94 U/L (ref 39–117)
Bilirubin, Direct: 0.1 mg/dL (ref 0.0–0.3)

## 2013-06-18 LAB — BASIC METABOLIC PANEL
BUN: 10 mg/dL (ref 6–23)
Chloride: 105 mEq/L (ref 96–112)
Creatinine, Ser: 0.8 mg/dL (ref 0.4–1.5)
Glucose, Bld: 111 mg/dL — ABNORMAL HIGH (ref 70–99)

## 2013-06-18 MED ORDER — ATORVASTATIN CALCIUM 10 MG PO TABS: 10.0000 mg | ORAL_TABLET | Freq: Every day | ORAL | Status: DC

## 2013-06-18 MED ORDER — AMLODIPINE BESYLATE 10 MG PO TABS: 10.0000 mg | ORAL_TABLET | Freq: Every day | ORAL | Status: DC

## 2013-06-18 MED ORDER — HYDROCHLOROTHIAZIDE 25 MG PO TABS: 25.0000 mg | ORAL_TABLET | Freq: Every day | ORAL | Status: DC

## 2013-06-18 MED ORDER — CLOPIDOGREL BISULFATE 75 MG PO TABS: 75.0000 mg | ORAL_TABLET | Freq: Every day | ORAL | Status: DC

## 2013-06-18 NOTE — Progress Notes (Addendum)
[name] Date of Birth: May 27, 1941 Medical Record #[id number]  History of Present Illness: [name] is seen back today for a followup visit. He has multiple medical issues which include HTN, noncompliance, HLD and known CAD. He has a history of tobacco abuse. He has LV dysfunction with ejection fraction of 40-45%. He says he is intolerant to Hydralazine. Failed on ACE and ARB in the past. Not able to take beta blockers due to his resting bradycardia. His last echocardiogram in April 2013 showed an ejection fraction of 40-45% with diffuse hypokinesis. There was dyssynchrony related to his left branch block. He had mild mitral insufficiency. He had a Myoview study in March of 2013 which showed normal perfusion. Ejection fraction of 40%.  2 followup today he reports predominant symptoms of fatigue. He complains that he has been losing weight. He has lost 5 pounds over the past year. He states he is short of breath constantly. He continues to smoke. He does not sleep well. He complains of frequent nocturia during the night. He rarely has symptoms of chest tightness but no true pain. He has tried to quit in the past with Chantix but didn't feel that this or nicotine replacement products helped.  Current Outpatient Prescriptions on File Prior to Visit  Medication Sig Dispense Refill  . [facility] PAIN RELIEF EXTRA STRENGTH 500 MG tablet TAKE 1 TABLET BY MOUTH EVERY 6 HOURS AS NEEDED FOR PAIN AND 1 TABLET AT BEDTIME FOR BACK PAIN  30 tablet  0  . HYDROcodone-acetaminophen (NORCO/VICODIN) 5-325 MG per tablet Take 2 tablets by mouth every 4 (four) hours as needed for pain.  10 tablet  0  . potassium chloride SA (K-DUR,KLOR-CON) 20 MEQ tablet Take 2 tablets twice daily.  120 tablet  2  . nitroGLYCERIN (NITROSTAT) 0.4 MG SL tablet Place 1 tablet (0.4 mg total) under the tongue every 5 (five) minutes as needed for chest pain.  100 tablet  3   No current facility-administered medications on file prior to visit.     Allergies  Allergen Reactions  . Aspirin     GI upset    Past Medical History  Diagnosis Date  . Dyslipidemia     on statin  . LBBB (left bundle branch block)   . CAD (coronary artery disease)     s/p stent RCA DES 9/09; s/p Myoview 2013 with reduced EF to 40%; no ischemia  . HTN (hypertension)     Reports intolerance to hydralazine; not a candidate for beta blockers due to bradycardia; failed on ACE and ARB  . Noncompliance   . LV dysfunction     EF about 40 to 45% per echo in April 2013  . LVH (left ventricular hypertrophy)     Past Surgical History  Procedure Laterality Date  . Cardiac catheterization      ejection fraction 50%    History  Smoking status  . Former Smoker  Smokeless tobacco  . Not on file    Comment: born in [place], raised in [place], works as Engineer, materials    History  Alcohol Use No    History reviewed. No pertinent family history.  Review of Systems: As noted in history of present illness  All other systems were reviewed and are negative.  Physical Exam: BP 156/74  Pulse 49  Ht 5\' 8"  (1.727 m)  Wt 148 lb (67.132 kg)  BMI 22.51 kg/m2 Patient is pleasant and in no acute distress. Skin is warm and dry. Color is normal.  HEENT is unremarkable. Normocephalic/atraumatic. PERRL. Sclera are nonicteric. Neck is supple. No masses. No JVD. Lungs are clear. Cardiac exam shows a regular rate and rhythm. Abdomen is soft. Extremities are without edema. Gait and ROM are intact. No gross neurologic deficits noted.   LABORATORY DATA: ECG today demonstrates sinus bradycardia with a left bundle branch block.  Assessment / Plan: 1. Hypertension.  We'll continue with his current medical therapy. Recommend sodium restriction. Patient is intolerant to multiple blood pressure medications.  2. Coronary disease with remote stenting of the right coronary in 2009 with a drug-eluting stent. He is asymptomatic. Myoview study 2013 showed no ischemia.  3. Tobacco  abuse. Patient counseled on smoking cessation.  4. Hyperlipidemia well controlled on Lipitor.   5. Fatigue. We will obtain complete lab work today including chemistries, lipid panel, CBC, TSH, and BNP.  6. Nocturia. We'll check a urinalysis.   7. Dyspnea. 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 We will schedule him for pulmonary function studies.

## 2013-06-18 NOTE — Telephone Encounter (Signed)
Patient phoned c/o having swelling at site of venipuncture from this am. Denies any discoloration, not bleeding. Patient states it is a little bigger than a quarter with slight pain. Patient very concerned that an "infected needle" used or "wrong vein" stuck. Tried to reassure patient. Advised patient to apply ice and call back if worse or no better. Patient verbalized understanding.

## 2013-06-18 NOTE — Patient Instructions (Signed)
We will refill your medications today.  Stop smoking  [phone] for smoking cessation classes.  We will check blood work and urinalysis today.  We will schedule you for pulmonary function studies.  I will see you in 6 months.

## 2013-06-18 NOTE — Telephone Encounter (Signed)
New message   Had bld drawn today---site now swollen the size of an egg

## 2013-06-19 LAB — LDL CHOLESTEROL, DIRECT: Direct LDL: 132.9 mg/dL

## 2013-06-19 NOTE — Telephone Encounter (Signed)
Follow up   Patient is returning call he thinks it is about his results of test done 10/9.

## 2013-06-19 NOTE — Telephone Encounter (Signed)
Returned call to patient lab results not available will call back next week after Dr.Jordan reviews.

## 2013-07-08 ENCOUNTER — Ambulatory Visit (INDEPENDENT_AMBULATORY_CARE_PROVIDER_SITE_OTHER): Payer: Medicare Other | Admitting: Internal Medicine

## 2013-07-08 ENCOUNTER — Telehealth: Payer: Self-pay | Admitting: Internal Medicine

## 2013-07-08 DIAGNOSIS — R5381 Other malaise: Secondary | ICD-10-CM

## 2013-07-08 DIAGNOSIS — R351 Nocturia: Secondary | ICD-10-CM

## 2013-07-08 DIAGNOSIS — I447 Left bundle-branch block, unspecified: Secondary | ICD-10-CM

## 2013-07-08 DIAGNOSIS — I2510 Atherosclerotic heart disease of native coronary artery without angina pectoris: Secondary | ICD-10-CM

## 2013-07-08 DIAGNOSIS — R0600 Dyspnea, unspecified: Secondary | ICD-10-CM

## 2013-07-08 DIAGNOSIS — R5383 Other fatigue: Secondary | ICD-10-CM

## 2013-07-08 DIAGNOSIS — Z720 Tobacco use: Secondary | ICD-10-CM

## 2013-07-08 LAB — PULMONARY FUNCTION TEST

## 2013-07-08 NOTE — Progress Notes (Signed)
PFT done today. 

## 2013-07-08 NOTE — Telephone Encounter (Signed)
Patient is needing an rx norco/vicodin 5-325 mg.

## 2013-07-08 NOTE — Telephone Encounter (Signed)
No, I will not "refill" Norco -  This is a controlled substance, not a chronic medication for this patient if there is an acute pain problem requiring Norco, he needs an office visit for that reason.  He is due for his annual exam 11/4 or later - we can discuss at that time

## 2013-07-09 NOTE — Telephone Encounter (Signed)
Notified pt with md response. Transferred to schedulers to make appt.../lmb 

## 2013-07-17 ENCOUNTER — Telehealth: Payer: Self-pay | Admitting: Cardiovascular Disease

## 2013-07-17 NOTE — Telephone Encounter (Signed)
New problem   Pt need to know results of his pulmonary function test. Please call pt. Pt now says that dr [place] is his doc not do nishan.

## 2013-07-17 NOTE — Telephone Encounter (Signed)
Returned call to patient Dr.Jordan out of office this week will call back next week after he reviews PFT's.

## 2013-07-23 NOTE — Telephone Encounter (Signed)
Spoke to patient 07/22/13 Dr.Jordan reviewed PFT's which revealed moderate obstructive disease.Dr.Jordan advised to follow up with PCP Dr.Valerie Felicity Coyer.

## 2013-07-28 ENCOUNTER — Institutional Professional Consult (permissible substitution): Payer: Medicare Other | Admitting: Pulmonary Disease

## 2013-07-29 ENCOUNTER — Institutional Professional Consult (permissible substitution): Payer: Medicare Other | Admitting: Pulmonary Disease

## 2013-07-31 ENCOUNTER — Encounter: Payer: Self-pay | Admitting: Internal Medicine

## 2013-07-31 ENCOUNTER — Ambulatory Visit (INDEPENDENT_AMBULATORY_CARE_PROVIDER_SITE_OTHER): Payer: Medicare Other | Admitting: Internal Medicine

## 2013-07-31 VITALS — BP 130/76 | HR 55 | Temp 98.1°F | Ht 68.0 in | Wt 146.1 lb

## 2013-07-31 DIAGNOSIS — E785 Hyperlipidemia, unspecified: Secondary | ICD-10-CM

## 2013-07-31 DIAGNOSIS — Z0000 Encounter for general adult medical examination without abnormal findings: Secondary | ICD-10-CM

## 2013-07-31 DIAGNOSIS — Z720 Tobacco use: Secondary | ICD-10-CM

## 2013-07-31 DIAGNOSIS — F17200 Nicotine dependence, unspecified, uncomplicated: Secondary | ICD-10-CM

## 2013-07-31 DIAGNOSIS — Z1211 Encounter for screening for malignant neoplasm of colon: Secondary | ICD-10-CM

## 2013-07-31 DIAGNOSIS — J449 Chronic obstructive pulmonary disease, unspecified: Secondary | ICD-10-CM

## 2013-07-31 DIAGNOSIS — J4489 Other specified chronic obstructive pulmonary disease: Secondary | ICD-10-CM | POA: Insufficient documentation

## 2013-07-31 MED ORDER — HYDROCODONE-ACETAMINOPHEN 5-325 MG PO TABS: 1.0000 | ORAL_TABLET | Freq: Four times a day (QID) | ORAL | Status: DC | PRN

## 2013-07-31 MED ORDER — ALBUTEROL SULFATE HFA 108 (90 BASE) MCG/ACT IN AERS: 2.0000 | INHALATION_SPRAY | Freq: Four times a day (QID) | RESPIRATORY_TRACT | Status: DC | PRN

## 2013-07-31 MED ORDER — TIOTROPIUM BROMIDE MONOHYDRATE 18 MCG IN CAPS: 18.0000 ug | ORAL_CAPSULE | Freq: Every day | RESPIRATORY_TRACT | Status: DC

## 2013-07-31 MED ORDER — ACETAMINOPHEN 500 MG PO TABS: 500.0000 mg | ORAL_TABLET | Freq: Four times a day (QID) | ORAL | Status: DC | PRN

## 2013-07-31 NOTE — Patient Instructions (Addendum)
It was good to see you today.  We have reviewed your prior records including labs and tests today  Health Maintenance reviewed - all recommended immunizations and age-appropriate screenings are up-to-date or declined. I encouraged you to reconsider taking the flu shot, and the pneumonia shot as discussed  Medications reviewed and updated  Start Spiriva inhalation once every day  use rescue albuterol every 4 hours if needed for shortness of breath/wheeze No other changes recommended at this time. Okay to use hydrocodone pain medication as needed for severe pain  Your prescription(s) have been given to you or electronically submitted to your pharmacy. Please take as directed and contact our office if you believe you are having problem(s) with the medication(s).  We'll have you screened for colon cancer with COLOGAURD (stool DNA testing) - this packet will be sent to your home by the testing company. Complete instructions are in the box. We will contact you with the results once available.  Please schedule followup in 6 months, call sooner if problems.  Continue to think about giving up cigarettes! Use nicotine gum, nicotine patches or electronic cigarettes. If you're interested in medication to help you quit, please call  - let me know how I can help!   Health Maintenance, Males A healthy lifestyle and preventative care can promote health and wellness.  Maintain regular health, dental, and eye exams.  Eat a healthy diet. Foods like vegetables, fruits, whole grains, low-fat dairy products, and lean protein foods contain the nutrients you need without too many calories. Decrease your intake of foods high in solid fats, added sugars, and salt. Get information about a proper diet from your caregiver, if necessary.  Regular physical exercise is one of the most important things you can do for your health. Most adults should get at least 150 minutes of moderate-intensity exercise (any activity  that increases your heart rate and causes you to sweat) each week. In addition, most adults need muscle-strengthening exercises on 2 or more days a week.   Maintain a healthy weight. The body mass index (BMI) is a screening tool to identify possible weight problems. It provides an estimate of body fat based on height and weight. Your caregiver can help determine your BMI, and can help you achieve or maintain a healthy weight. For adults 20 years and older:  A BMI below 18.5 is considered underweight.  A BMI of 18.5 to 24.9 is normal.  A BMI of 25 to 29.9 is considered overweight.  A BMI of 30 and above is considered obese.  Maintain normal blood lipids and cholesterol by exercising and minimizing your intake of saturated fat. Eat a balanced diet with plenty of fruits and vegetables. Blood tests for lipids and cholesterol should begin at age 85 and be repeated every 5 years. If your lipid or cholesterol levels are high, you are over 50, or you are a high risk for heart disease, you may need your cholesterol levels checked more frequently.Ongoing high lipid and cholesterol levels should be treated with medicines, if diet and exercise are not effective.  If you smoke, find out from your caregiver how to quit. If you do not use tobacco, do not start.  Lung cancer screening is recommended for adults aged 38 80 years who are at high risk for developing lung cancer because of a history of smoking. Yearly low-dose computed tomography (CT) is recommended for people who have at least a 30-pack-year history of smoking and are a current smoker or have quit  within the past 15 years. A pack year of smoking is smoking an average of 1 pack of cigarettes a day for 1 year (for example: 1 pack a day for 30 years or 2 packs a day for 15 years). Yearly screening should continue until the smoker has stopped smoking for at least 15 years. Yearly screening should also be stopped for people who develop a health problem  that would prevent them from having lung cancer treatment.  If you choose to drink alcohol, do not exceed 2 drinks per day. One drink is considered to be 12 ounces (355 mL) of beer, 5 ounces (148 mL) of wine, or 1.5 ounces (44 mL) of liquor.  Avoid use of street drugs. Do not share needles with anyone. Ask for help if you need support or instructions about stopping the use of drugs.  High blood pressure causes heart disease and increases the risk of stroke. Blood pressure should be checked at least every 1 to 2 years. Ongoing high blood pressure should be treated with medicines if weight loss and exercise are not effective.  If you are 14 to 70 years old, ask your caregiver if you should take aspirin to prevent heart disease.  Diabetes screening involves taking a blood sample to check your fasting blood sugar level. This should be done once every 3 years, after age 94, if you are within normal weight and without risk factors for diabetes. Testing should be considered at a younger age or be carried out more frequently if you are overweight and have at least 1 risk factor for diabetes.  Colorectal cancer can be detected and often prevented. Most routine colorectal cancer screening begins at the age of 41 and continues through age 60. However, your caregiver may recommend screening at an earlier age if you have risk factors for colon cancer. On a yearly basis, your caregiver may provide home test kits to check for hidden blood in the stool. Use of a small camera at the end of a tube, to directly examine the colon (sigmoidoscopy or colonoscopy), can detect the earliest forms of colorectal cancer. Talk to your caregiver about this at age 67, when routine screening begins. Direct examination of the colon should be repeated every 5 to 10 years through age 65, unless early forms of pre-cancerous polyps or small growths are found.  Hepatitis C blood testing is recommended for all people born from [birth year] through  1965 and any individual with known risks for hepatitis C.  Healthy men should no longer receive prostate-specific antigen (PSA) blood tests as part of routine cancer screening. Consult with your caregiver about prostate cancer screening.  Testicular cancer screening is not recommended for adolescents or adult males who have no symptoms. Screening includes self-exam, caregiver exam, and other screening tests. Consult with your caregiver about any symptoms you have or any concerns you have about testicular cancer.  Practice safe sex. Use condoms and avoid high-risk sexual practices to reduce the spread of sexually transmitted infections (STIs).  Use sunscreen. Apply sunscreen liberally and repeatedly throughout the day. You should seek shade when your shadow is shorter than you. Protect yourself by wearing long sleeves, pants, a wide-brimmed hat, and sunglasses year round, whenever you are outdoors.  Notify your caregiver of new moles or changes in moles, especially if there is a change in shape or color. Also notify your caregiver if a mole is larger than the size of a pencil eraser.  A one-time screening for abdominal aortic aneurysm (  AAA) and surgical repair of large AAAs by sound wave imaging (ultrasonography) is recommended for ages 52 to 41 years who are current or former smokers.  Stay current with your immunizations. Document Released: 02/23/2008 Document Revised: 12/22/2012 Document Reviewed: 01/22/2011 [facility] Patient Information 2014 [place], [place]. Smoking Cessation Quitting smoking is important to your health and has many advantages. However, it is not always easy to quit since nicotine is a very addictive drug. Often times, people try 3 times or more before being able to quit. This document explains the best ways for you to prepare to quit smoking. Quitting takes hard work and a lot of effort, but you can do it. ADVANTAGES OF QUITTING SMOKING  You will live longer, feel better, and  live better.  Your body will feel the impact of quitting smoking almost immediately.  Within 20 minutes, blood pressure decreases. Your pulse returns to its normal level.  After 8 hours, carbon monoxide levels in the blood return to normal. Your oxygen level increases.  After 24 hours, the chance of having a heart attack starts to decrease. Your breath, hair, and body stop smelling like smoke.  After 48 hours, damaged nerve endings begin to recover. Your sense of taste and smell improve.  After 72 hours, the body is virtually free of nicotine. Your bronchial tubes relax and breathing becomes easier.  After 2 to 12 weeks, lungs can hold more air. Exercise becomes easier and circulation improves.  The risk of having a heart attack, stroke, cancer, or lung disease is greatly reduced.  After 1 year, the risk of coronary heart disease is cut in half.  After 5 years, the risk of stroke falls to the same as a nonsmoker.  After 10 years, the risk of lung cancer is cut in half and the risk of other cancers decreases significantly.  After 15 years, the risk of coronary heart disease drops, usually to the level of a nonsmoker.  If you are pregnant, quitting smoking will improve your chances of having a healthy baby.  The people you live with, especially any children, will be healthier.  You will have extra money to spend on things other than cigarettes. QUESTIONS TO THINK ABOUT BEFORE ATTEMPTING TO QUIT You may want to talk about your answers with your caregiver.  Why do you want to quit?  If you tried to quit in the past, what helped and what did not?  What will be the most difficult situations for you after you quit? How will you plan to handle them?  Who can help you through the tough times? Your family? Friends? A caregiver?  What pleasures do you get from smoking? What ways can you still get pleasure if you quit? Here are some questions to ask your caregiver:  How can you help  me to be successful at quitting?  What medicine do you think would be best for me and how should I take it?  What should I do if I need more help?  What is smoking withdrawal like? How can I get information on withdrawal? GET READY  Set a quit date.  Change your environment by getting rid of all cigarettes, ashtrays, matches, and lighters in your home, car, or work. Do not let people smoke in your home.  Review your past attempts to quit. Think about what worked and what did not. GET SUPPORT AND ENCOURAGEMENT You have a better chance of being successful if you have help. You can get support in many ways.  Tell your family, friends, and co-workers that you are going to quit and need their support. Ask them not to smoke around you.  Get individual, group, or telephone counseling and support. Programs are available at [employer] and health centers. Call your local health department for information about programs in your area.  Spiritual beliefs and practices may help some smokers quit.  Download a "quit meter" on your computer to keep track of quit statistics, such as how long you have gone without smoking, cigarettes not smoked, and money saved.  Get a self-help book about quitting smoking and staying off of tobacco. LEARN NEW SKILLS AND BEHAVIORS  Distract yourself from urges to smoke. Talk to someone, go for a walk, or occupy your time with a task.  Change your normal routine. Take a different route to work. Drink tea instead of coffee. Eat breakfast in a different place.  Reduce your stress. Take a hot bath, exercise, or read a book.  Plan something enjoyable to do every day. Reward yourself for not smoking.  Explore interactive web-based programs that specialize in helping you quit. GET MEDICINE AND USE IT CORRECTLY Medicines can help you stop smoking and decrease the urge to smoke. Combining medicine with the above behavioral methods and support can greatly increase your  chances of successfully quitting smoking.  Nicotine replacement therapy helps deliver nicotine to your body without the negative effects and risks of smoking. Nicotine replacement therapy includes nicotine gum, lozenges, inhalers, nasal sprays, and skin patches. Some may be available over-the-counter and others require a prescription.  Antidepressant medicine helps people abstain from smoking, but how this works is unknown. This medicine is available by prescription.  Nicotinic receptor partial agonist medicine simulates the effect of nicotine in your brain. This medicine is available by prescription. Ask your caregiver for advice about which medicines to use and how to use them based on your health history. Your caregiver will tell you what side effects to look out for if you choose to be on a medicine or therapy. Carefully read the information on the package. Do not use any other product containing nicotine while using a nicotine replacement product.  RELAPSE OR DIFFICULT SITUATIONS Most relapses occur within the first 3 months after quitting. Do not be discouraged if you start smoking again. Remember, most people try several times before finally quitting. You may have symptoms of withdrawal because your body is used to nicotine. You may crave cigarettes, be irritable, feel very hungry, cough often, get headaches, or have difficulty concentrating. The withdrawal symptoms are only temporary. They are strongest when you first quit, but they will go away within 10 14 days. To reduce the chances of relapse, try to:  Avoid drinking alcohol. Drinking lowers your chances of successfully quitting.  Reduce the amount of caffeine you consume. Once you quit smoking, the amount of caffeine in your body increases and can give you symptoms, such as a rapid heartbeat, sweating, and anxiety.  Avoid smokers because they can make you want to smoke.  Do not let weight gain distract you. Many smokers will gain  weight when they quit, usually less than 10 pounds. Eat a healthy diet and stay active. You can always lose the weight gained after you quit.  Find ways to improve your mood other than smoking. FOR MORE INFORMATION  www.smokefree.gov  Document Released: 08/21/2001 Document Revised: 02/26/2012 Document Reviewed: 12/06/2011 [facility] Patient Information 2014 [place], [place].

## 2013-07-31 NOTE — Progress Notes (Signed)
Subjective:    Patient ID: [name], male    DOB: [birth year], 72 y.o.   MRN: [id number]  HPI   Here for medicare wellness  Diet: heart healthy Physical activity: sedentary Depression/mood screen: negative Hearing: intact to whispered voice Visual acuity: grossly normal, performs annual eye exam  ADLs: capable Fall risk: none Home safety: good Cognitive evaluation: intact to orientation, naming, recall and repetition EOL planning: adv directives, full code/ I agree  I have personally reviewed and have noted 1. The patient's medical and social history 2. Their use of alcohol, tobacco or illicit drugs 3. Their current medications and supplements 4. The patient's functional ability including ADL's, fall risks, home safety risks and hearing or visual impairment. 5. Diet and physical activities 6. Evidence for depression or mood disorders  Also follows with Dr. Swaziland in cardiology division  concerned about ?new dx COPD per cards as explanation for DOE -  ?refill on norco for chronic back pain - takes prn  Also reviewed chronic medical issues today:  Coronary artery disease. Status post stent (DES) September 2009 to RCA. Myoview March 2013 without ischemia - takes antiplatelet therapy Plavix only because of aspirin allergy -the patient reports compliance with medication(s) as prescribed. Denies adverse side effects.  Dyslipidemia. On statin for same. - the patient reports compliance with medication(s) as prescribed. Denies adverse side effects.  hypertension - history of variable compliance. Denies chest pain, change in chronic dyspnea on exertion, or edema  Past Medical History  Diagnosis Date  . Dyslipidemia     on statin  . LBBB (left bundle branch block)   . CAD (coronary artery disease)     s/p stent RCA DES 9/09; s/p Myoview 2013 with reduced EF to 40%; no ischemia  . HTN (hypertension)     Reports intolerance to hydralazine; not a candidate for beta blockers  due to bradycardia; failed on ACE and ARB  . Noncompliance   . LV dysfunction     EF about 40 to 45% per echo in April 2013  . LVH (left ventricular hypertrophy)    No family history on file. History  Substance Use Topics  . Smoking status: Former Games developer  . Smokeless tobacco: Not on file     Comment: born in [place], raised in [place], works as Engineer, materials  . Alcohol Use: No    Review of Systems Constitutional: Negative for fever or weight change. positive fatigue  Respiratory: Negative for cough -chronic DOE and shortness of breath.   Cardiovascular: Negative for chest pain or palpitations.  Gastrointestinal: Negative for abdominal pain, no bowel changes.  Musculoskeletal: complains of chronic L low back pain - worse in AM getting out of bed or after inactivity >1hour. Negative for gait problem or joint swelling.  Skin: Negative for rash.  Neurological: Negative for dizziness or headache.  No other specific complaints in a complete review of systems (except as listed in HPI above).     Objective:   Physical Exam BP 130/76  Pulse 55  Temp(Src) 98.1 F (36.7 C) (Oral)  Ht 5\' 8"  (1.727 m)  Wt 146 lb 1.9 oz (66.28 kg)  BMI 22.22 kg/m2  SpO2 98% Wt Readings from Last 3 Encounters:  07/31/13 146 lb 1.9 oz (66.28 kg)  06/18/13 148 lb (67.132 kg)  07/14/12 154 lb (69.854 kg)   Constitutional: he appears well-developed and well-nourished. No distress.  HENT: Head: Normocephalic and atraumatic. Ears: B TMs ok, no erythema or effusion; Nose: Nose normal. Mouth/Throat:  Oropharynx is clear and moist. No oropharyngeal exudate.  Eyes: Conjunctivae and EOM are normal. Pupils are equal, round, and reactive to light. No scleral icterus.  Neck: Normal range of motion. Neck supple. No JVD present. No thyromegaly present.  Cardiovascular: Normal rate, regular rhythm and normal heart sounds.  No murmur heard. No BLE edema. Pulmonary/Chest: Effort normal and breath sounds normal. No  respiratory distress. he has soft end exp wheezes, R>L base.  Abdominal: Soft. Bowel sounds are normal. he exhibits no distension. There is no tenderness. no masses Musculoskeletal: Back: full range of motion of thoracic and lumbar spine. Non tender to palpation. Negative straight leg raise. DTR's are symmetrically intact. Sensation intact in all dermatomes of the lower extremities. Full strength to manual muscle testing. patient is able to heel toe walk without difficulty and ambulates with antalgic gait. Normal range of motion, no joint effusions. No gross deformities Neurological: he is alert and oriented to person, place, and time. No cranial nerve deficit. Coordination normal.  Skin: Skin is warm and dry. No rash noted. No erythema.  Psychiatric: he has a normal mood and affect. behavior is normal. Judgment and thought content normal.   Lab Results  Component Value Date   WBC 4.8 06/18/2013   HGB 12.4* 06/18/2013   HCT 37.2* 06/18/2013   PLT 184.0 06/18/2013   GLUCOSE 111* 06/18/2013   CHOL 208* 06/18/2013   TRIG 97.0 06/18/2013   HDL 54.20 06/18/2013   LDLDIRECT 132.9 06/18/2013   LDLCALC 68 11/12/2011   ALT 10 06/18/2013   AST 17 06/18/2013   NA 142 06/18/2013   K 3.3* 06/18/2013   CL 105 06/18/2013   CREATININE 0.8 06/18/2013   BUN 10 06/18/2013   CO2 28 06/18/2013   TSH 3.01 06/18/2013   INR 1.01 07/19/2010       Assessment & Plan:   V70.0/CPX/AWV - Today patient counseled on age appropriate routine health concerns for screening and prevention, each reviewed and up to date or declined. Immunizations reviewed and up to date or declined. Labs/ECG reviewed. Risk factors for depression reviewed and negative. Hearing function and visual acuity are intact. ADLs screened and addressed as needed. Functional ability and level of safety reviewed and appropriate. Education, counseling and referrals performed based on assessed risks today. Patient provided with a copy of personalized plan for preventive  services.  Declines colonoscopy because of prep but agrees to cologaurd - order faxed  Also see problem list. Medications and labs reviewed today.

## 2013-07-31 NOTE — Progress Notes (Signed)
Pre-visit discussion using our clinic review tool. No additional management support is needed unless otherwise documented below in the visit note.  

## 2013-07-31 NOTE — Assessment & Plan Note (Signed)
On statin - check lipids annually -reviewed last results The current medical regimen is effective;  continue present plan and medications.

## 2013-07-31 NOTE — Assessment & Plan Note (Signed)
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

## 2013-07-31 NOTE — Assessment & Plan Note (Addendum)
New dx 06/2013 - PFTs ordered by cards for eval of DOE Will start Spiriva daily and use Alb rescue MDI prn - erx done Educated on relationship to tobacco abuse and improtance of smoking cessation to minimize disease progression Pt will call if symptoms or problems

## 2013-08-24 ENCOUNTER — Ambulatory Visit (INDEPENDENT_AMBULATORY_CARE_PROVIDER_SITE_OTHER): Payer: Medicare Other | Admitting: Internal Medicine

## 2013-08-24 ENCOUNTER — Encounter: Payer: Self-pay | Admitting: Internal Medicine

## 2013-08-24 VITALS — BP 142/70 | HR 57 | Temp 98.5°F | Wt 149.6 lb

## 2013-08-24 DIAGNOSIS — I214 Non-ST elevation (NSTEMI) myocardial infarction: Secondary | ICD-10-CM

## 2013-08-24 DIAGNOSIS — J449 Chronic obstructive pulmonary disease, unspecified: Secondary | ICD-10-CM

## 2013-08-24 DIAGNOSIS — Z720 Tobacco use: Secondary | ICD-10-CM

## 2013-08-24 DIAGNOSIS — I2510 Atherosclerotic heart disease of native coronary artery without angina pectoris: Secondary | ICD-10-CM

## 2013-08-24 DIAGNOSIS — F17200 Nicotine dependence, unspecified, uncomplicated: Secondary | ICD-10-CM

## 2013-08-24 DIAGNOSIS — M545 Low back pain: Secondary | ICD-10-CM

## 2013-08-24 MED ORDER — CARVEDILOL 3.125 MG PO TABS: 3.1250 mg | ORAL_TABLET | Freq: Two times a day (BID) | ORAL | Status: DC

## 2013-08-24 MED ORDER — ASPIRIN EC 81 MG PO TBEC: 81.0000 mg | DELAYED_RELEASE_TABLET | Freq: Every day | ORAL | Status: DC

## 2013-08-24 MED ORDER — LISINOPRIL 2.5 MG PO TABS: 2.5000 mg | ORAL_TABLET | Freq: Every day | ORAL | Status: DC

## 2013-08-24 MED ORDER — HYDROCODONE-ACETAMINOPHEN 5-325 MG PO TABS: 1.0000 | ORAL_TABLET | Freq: Every evening | ORAL | Status: DC | PRN

## 2013-08-24 NOTE — Assessment & Plan Note (Signed)
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 

## 2013-08-24 NOTE — Assessment & Plan Note (Signed)
DES to RCA 05/2008 - NSTEMI 08/16/13 at [facility] - DC summary and med changes reviewed Cath done - total occlusion OM1 diagonal, otherwise nonobst CAD - unable to cross to stent no anginal symptoms but chronic dyspnea on exertion, unchanged Myoview 11/2011: no ischemia Follows with [facility] cards for same -  despite ASA "allergy" (stomach upset per pt) encouraged to continue 81mg  daily given NSTEMI 08/2013 in addition to plavix and med mgmt

## 2013-08-24 NOTE — Patient Instructions (Addendum)
It was good to see you today.  We have reviewed your prior records from [facility] about your recent heart attack 08/16/13 including labs and tests today  Medications reviewed and updated, no new changes recommended at this time. Refill on hydrocodone today - do not use more than once daily!  we'll make referral to back specialist for your back pain and help arrange follow up with Dr Swaziland. Our office will contact you regarding appointment(s) once made.  Back Exercises Back exercises help treat and prevent back injuries. The goal is to increase your strength in your belly (abdominal) and back muscles. These exercises can also help with flexibility. Start these exercises when told by your doctor. HOME CARE Back exercises include: Pelvic Tilt.  Lie on your back with your knees bent. Tilt your pelvis until the lower part of your back is against the floor. Hold this position 5 to 10 sec. Repeat this exercise 5 to 10 times. Knee to Chest.  Pull 1 knee up against your chest and hold for 20 to 30 seconds. Repeat this with the other knee. This may be done with the other leg straight or bent, whichever feels better. Then, pull both knees up against your chest. Sit-Ups or Curl-Ups.  Bend your knees 90 degrees. Start with tilting your pelvis, and do a partial, slow sit-up. Only lift your upper half 30 to 45 degrees off the floor. Take at least 2 to 3 seonds for each sit-up. Do not do sit-ups with your knees out straight. If partial sit-ups are difficult, simply do the above but with only tightening your belly (abdominal) muscles and holding it as told. Hip-Lift.  Lie on your back with your knees flexed 90 degrees. Push down with your feet and shoulders as you raise your hips 2 inches off the floor. Hold for 10 seconds, repeat 5 to 10 times. Back Arches.  Lie on your stomach. Prop yourself up on bent elbows. Slowly press on your hands, causing an arch in your low back. Repeat 3 to 5  times. Shoulder-Lifts.  Lie face down with arms beside your body. Keep hips and belly pressed to floor as you slowly lift your head and shoulders off the floor. Do not overdo your exercises. Be careful in the beginning. Exercises may cause you some mild back discomfort. If the pain lasts for more than 15 minutes, stop the exercises until you see your doctor. Improvement with exercise for back problems is slow.  Document Released: 09/29/2010 Document Revised: 11/19/2011 Document Reviewed: 06/28/2011 [facility] Patient Information 2014 [place], [place].  Chronic Back Pain  When back pain lasts longer than 3 months, it is called chronic back pain.People with chronic back pain often go through certain periods that are more intense (flare-ups).  CAUSES Chronic back pain can be caused by wear and tear (degeneration) on different structures in your back. These structures include:  The bones of your spine (vertebrae) and the joints surrounding your spinal cord and nerve roots (facets).  The strong, fibrous tissues that connect your vertebrae (ligaments). Degeneration of these structures may result in pressure on your nerves. This can lead to constant pain. HOME CARE INSTRUCTIONS  Avoid bending, heavy lifting, prolonged sitting, and activities which make the problem worse.  Take brief periods of rest throughout the day to reduce your pain. Lying down or standing usually is better than sitting while you are resting.  Take over-the-counter or prescription medicines only as directed by your caregiver. SEEK IMMEDIATE MEDICAL CARE IF:  You have weakness or numbness in one of your legs or feet.  You have trouble controlling your bladder or bowels.  You have nausea, vomiting, abdominal pain, shortness of breath, or fainting. Document Released: 10/04/2004 Document Revised: 11/19/2011 Document Reviewed: 08/11/2011 [facility] Patient Information 2014 [place], [place]. You Can Quit Smoking If you are ready  to quit smoking or are thinking about it, congratulations! You have chosen to help yourself be healthier and live longer! There are lots of different ways to quit smoking. Nicotine gum, nicotine patches, a nicotine inhaler, or nicotine nasal spray can help with physical craving. Hypnosis, support groups, and medicines help break the habit of smoking. TIPS TO GET OFF AND STAY OFF CIGARETTES  Learn to predict your moods. Do not let a bad situation be your excuse to have a cigarette. Some situations in your life might tempt you to have a cigarette.  Ask friends and co-workers not to smoke around you.  Make your home smoke-free.  Never have "just one" cigarette. It leads to wanting another and another. Remind yourself of your decision to quit.  On a card, make a list of your reasons for not smoking. Read it at least the same number of times a day as you have a cigarette. Tell yourself everyday, "I do not want to smoke. I choose not to smoke."  Ask someone at home or work to help you with your plan to quit smoking.  Have something planned after you eat or have a cup of coffee. Take a walk or get other exercise to perk you up. This will help to keep you from overeating.  Try a relaxation exercise to calm you down and decrease your stress. Remember, you may be tense and nervous the first two weeks after you quit. This will pass.  Find new activities to keep your hands busy. Play with a pen, coin, or rubber band. Doodle or draw things on paper.  Brush your teeth right after eating. This will help cut down the craving for the taste of tobacco after meals. You can try mouthwash too.  Try gum, breath mints, or diet candy to keep something in your mouth. IF YOU SMOKE AND WANT TO QUIT:  Do not stock up on cigarettes. Never buy a carton. Wait until one pack is finished before you buy another.  Never carry cigarettes with you at work or at home.  Keep cigarettes as far away from you as possible. Leave  them with someone else.  Never carry matches or a lighter with you.  Ask yourself, "Do I need this cigarette or is this just a reflex?"  Bet with someone that you can quit. Put cigarette money in a piggy bank every morning. If you smoke, you give up the money. If you do not smoke, by the end of the week, you keep the money.  Keep trying. It takes 21 days to change a habit!  Talk to your doctor about using medicines to help you quit. These include nicotine replacement gum, lozenges, or skin patches. Document Released: 06/23/2009 Document Revised: 11/19/2011 Document Reviewed: 06/23/2009 [facility] Patient Information 2014 [place], [place].

## 2013-08-24 NOTE — Progress Notes (Signed)
Pre-visit discussion using our clinic review tool. No additional management support is needed unless otherwise documented below in the visit note.  

## 2013-08-24 NOTE — Assessment & Plan Note (Signed)
>>  ASSESSMENT AND PLAN FOR CAD (CORONARY ARTERY DISEASE) WRITTEN ON 08/24/2013 11:38 AM BY Rene Paci A, MD  DES to RCA 05/2008 - NSTEMI 08/16/13 at [facility] - DC summary and med changes reviewed Cath done - total occlusion OM1 diagonal, otherwise nonobst CAD - unable to cross to stent no anginal symptoms but chronic dyspnea on exertion, unchanged Myoview 11/2011: no ischemia Follows with [facility] cards for same -  despite ASA "allergy" (stomach upset per pt) encouraged to continue 81mg  daily given NSTEMI 08/2013 in addition to plavix and med mgmt

## 2013-08-24 NOTE — Assessment & Plan Note (Signed)
>>  ASSESSMENT AND PLAN FOR CORONARY ARTERY DISEASE INVOLVING NATIVE CORONARY ARTERY OF NATIVE HEART WITHOUT ANGINA PECTORIS WRITTEN ON 06/12/2023  7:15 AM BY Murlene Revell, DO  >>ASSESSMENT AND PLAN FOR CAD (CORONARY ARTERY DISEASE) WRITTEN ON 08/24/2013 11:38 AM BY Rene Paci A, MD  DES to RCA 05/2008 - NSTEMI 08/16/13 at [facility] - DC summary and med changes reviewed Cath done - total occlusion OM1 diagonal, otherwise nonobst CAD - unable to cross to stent no anginal symptoms but chronic dyspnea on exertion, unchanged Myoview 11/2011: no ischemia Follows with [facility] cards for same -  despite ASA "allergy" (stomach upset per pt) encouraged to continue 81mg  daily given NSTEMI 08/2013 in addition to plavix and med mgmt

## 2013-08-24 NOTE — Assessment & Plan Note (Signed)
New dx 06/2013 - reviewed PFTs ordered by cards for eval of chronic dyspnea on exertion started Spiriva daily and Alb rescue MDI prn - feels improved, continue same Educated on relationship to tobacco abuse and improtance of smoking cessation to minimize disease progression

## 2013-08-24 NOTE — Progress Notes (Signed)
Subjective:    Patient ID: [name], male    DOB: Jun 20, 1941, 72 y.o.   MRN: [id number] HPI  Here for follow up - seen at [facility] for chest pain - labs positive for NSTEMI (Trop peak 4.3)- cath done 08/16/13  But, as pt reports, "no new stent" as unable to cross wire to 1st diagonals and 1st OM  Also reviewed chronic medical issues today:  Coronary artery disease. Status post stent (DES) September 2009 to RCA. Myoview March 2013 without ischemia - takes antiplatelet therapy Plavix only because of aspirin allergy -the patient reports compliance with medication(s) as prescribed. Denies adverse side effects.  Dyslipidemia. On statin for same. - the patient reports compliance with medication(s) as prescribed. Denies adverse side effects.  hypertension - history of variable compliance. Denies chest pain, change in chronic dyspnea on exertion, or edema  COPD - dx 07/2013 and begun on Spriva for same  Chronic low back pain - takes Norco regularly for same  Past Medical History  Diagnosis Date  . Dyslipidemia     on statin  . LBBB (left bundle branch block)   . CAD (coronary artery disease)     s/p stent RCA DES 9/09; s/p Myoview 2013 with reduced EF to 40%; no ischemia  . HTN (hypertension)     Reports intolerance to hydralazine; not a candidate for beta blockers due to bradycardia; failed on ACE and ARB  . Noncompliance   . LV dysfunction     EF about 40 to 45% per echo in April 2013  . LVH (left ventricular hypertrophy)   . COPD (chronic obstructive pulmonary disease) dx 06/2013    PFTs 07/08/13: mod obst with resp to bronchodilator, moderate decrease diffusion, airtrapping  . Chronic low back pain     Review of Systems  Respiratory: Negative for cough and shortness of breath.   Cardiovascular: Negative for chest pain and leg swelling.  Musculoskeletal: Positive for back pain (chronic w/o change).        Objective:   Physical Exam BP 142/70  Pulse 57   Temp(Src) 98.5 F (36.9 C) (Oral)  Wt 149 lb 9.6 oz (67.858 kg)  SpO2 98% Wt Readings from Last 3 Encounters:  08/24/13 149 lb 9.6 oz (67.858 kg)  07/31/13 146 lb 1.9 oz (66.28 kg)  06/18/13 148 lb (67.132 kg)   Constitutional: he appears well-developed and well-nourished. No distress.  Neck: Normal range of motion. Neck supple. No JVD present. No thyromegaly present.  Cardiovascular: Normal rate, regular rhythm and normal heart sounds.  No murmur heard. No BLE edema. Pulmonary/Chest: Effort normal and breath sounds normal. No respiratory distress. No wheeze or crackle today Neurological: he is alert and oriented to person, place, and time. No cranial nerve deficit. Coordination normal.  Skin: Skin is warm and dry. No rash noted. No erythema.  Psychiatric: he has a normal mood and affect. behavior is normal. Judgment and thought content normal.   Lab Results  Component Value Date   WBC 4.8 06/18/2013   HGB 12.4* 06/18/2013   HCT 37.2* 06/18/2013   PLT 184.0 06/18/2013   GLUCOSE 111* 06/18/2013   CHOL 208* 06/18/2013   TRIG 97.0 06/18/2013   HDL 54.20 06/18/2013   LDLDIRECT 132.9 06/18/2013   LDLCALC 68 11/12/2011   ALT 10 06/18/2013   AST 17 06/18/2013   NA 142 06/18/2013   K 3.3* 06/18/2013   CL 105 06/18/2013   CREATININE 0.8 06/18/2013   BUN 10 06/18/2013  CO2 28 06/18/2013   TSH 3.01 06/18/2013   INR 1.01 07/19/2010       Assessment & Plan:   see problem list. Medications and labs reviewed today.  Low back pain, chronic - no neuro deficits - encouraged against regular Norco use, bedtime as needed - also refer to [facility] ortho for eval

## 2013-08-28 ENCOUNTER — Other Ambulatory Visit: Payer: Self-pay | Admitting: Internal Medicine

## 2013-09-02 ENCOUNTER — Emergency Department (HOSPITAL_COMMUNITY): Payer: Medicare Other

## 2013-09-02 ENCOUNTER — Inpatient Hospital Stay (HOSPITAL_COMMUNITY)
Admission: EM | Admit: 2013-09-02 | Discharge: 2013-09-05 | DRG: 194 | Disposition: A | Discharge status: Home / Self Care | Payer: Medicare Other | Attending: Internal Medicine | Admitting: Internal Medicine

## 2013-09-02 ENCOUNTER — Encounter (HOSPITAL_COMMUNITY): Payer: Self-pay | Admitting: Emergency Medicine

## 2013-09-02 DIAGNOSIS — R351 Nocturia: Secondary | ICD-10-CM

## 2013-09-02 DIAGNOSIS — D649 Anemia, unspecified: Secondary | ICD-10-CM | POA: Diagnosis present

## 2013-09-02 DIAGNOSIS — I214 Non-ST elevation (NSTEMI) myocardial infarction: Secondary | ICD-10-CM | POA: Diagnosis present

## 2013-09-02 DIAGNOSIS — Z418 Encounter for other procedures for purposes other than remedying health state: Secondary | ICD-10-CM

## 2013-09-02 DIAGNOSIS — Z2989 Encounter for other specified prophylactic measures: Secondary | ICD-10-CM

## 2013-09-02 DIAGNOSIS — Z720 Tobacco use: Secondary | ICD-10-CM

## 2013-09-02 DIAGNOSIS — J189 Pneumonia, unspecified organism: Secondary | ICD-10-CM

## 2013-09-02 DIAGNOSIS — J4489 Other specified chronic obstructive pulmonary disease: Secondary | ICD-10-CM | POA: Diagnosis present

## 2013-09-02 DIAGNOSIS — Z9119 Patient's noncompliance with other medical treatment and regimen: Secondary | ICD-10-CM

## 2013-09-02 DIAGNOSIS — Z886 Allergy status to analgesic agent status: Secondary | ICD-10-CM

## 2013-09-02 DIAGNOSIS — J449 Chronic obstructive pulmonary disease, unspecified: Secondary | ICD-10-CM

## 2013-09-02 DIAGNOSIS — I4729 Other ventricular tachycardia: Secondary | ICD-10-CM

## 2013-09-02 DIAGNOSIS — Z87891 Personal history of nicotine dependence: Secondary | ICD-10-CM

## 2013-09-02 DIAGNOSIS — R5383 Other fatigue: Secondary | ICD-10-CM

## 2013-09-02 DIAGNOSIS — M545 Low back pain, unspecified: Secondary | ICD-10-CM | POA: Diagnosis present

## 2013-09-02 DIAGNOSIS — I472 Ventricular tachycardia, unspecified: Secondary | ICD-10-CM | POA: Diagnosis not present

## 2013-09-02 DIAGNOSIS — I2510 Atherosclerotic heart disease of native coronary artery without angina pectoris: Secondary | ICD-10-CM | POA: Diagnosis present

## 2013-09-02 DIAGNOSIS — I519 Heart disease, unspecified: Secondary | ICD-10-CM

## 2013-09-02 DIAGNOSIS — R0600 Dyspnea, unspecified: Secondary | ICD-10-CM

## 2013-09-02 DIAGNOSIS — E785 Hyperlipidemia, unspecified: Secondary | ICD-10-CM

## 2013-09-02 DIAGNOSIS — I447 Left bundle-branch block, unspecified: Secondary | ICD-10-CM | POA: Diagnosis present

## 2013-09-02 DIAGNOSIS — I2589 Other forms of chronic ischemic heart disease: Secondary | ICD-10-CM | POA: Diagnosis present

## 2013-09-02 DIAGNOSIS — Z887 Allergy status to serum and vaccine status: Secondary | ICD-10-CM

## 2013-09-02 DIAGNOSIS — G8929 Other chronic pain: Secondary | ICD-10-CM | POA: Diagnosis present

## 2013-09-02 DIAGNOSIS — J101 Influenza due to other identified influenza virus with other respiratory manifestations: Secondary | ICD-10-CM

## 2013-09-02 DIAGNOSIS — Z91199 Patient's noncompliance with other medical treatment and regimen due to unspecified reason: Secondary | ICD-10-CM

## 2013-09-02 DIAGNOSIS — Z9861 Coronary angioplasty status: Secondary | ICD-10-CM

## 2013-09-02 DIAGNOSIS — R6889 Other general symptoms and signs: Secondary | ICD-10-CM | POA: Diagnosis present

## 2013-09-02 DIAGNOSIS — J1100 Influenza due to unidentified influenza virus with unspecified type of pneumonia: Principal | ICD-10-CM | POA: Diagnosis present

## 2013-09-02 DIAGNOSIS — I10 Essential (primary) hypertension: Secondary | ICD-10-CM | POA: Diagnosis present

## 2013-09-02 HISTORY — DX: Pneumonia, unspecified organism: J18.9

## 2013-09-02 LAB — CBC WITH DIFFERENTIAL/PLATELET
Basophils Absolute: 0 10*3/uL (ref 0.0–0.1)
Basophils Relative: 0 % (ref 0–1)
Eosinophils Relative: 1 % (ref 0–5)
HCT: 35 % — ABNORMAL LOW (ref 39.0–52.0)
Lymphocytes Relative: 7 % — ABNORMAL LOW (ref 12–46)
Lymphs Abs: 0.5 10*3/uL — ABNORMAL LOW (ref 0.7–4.0)
MCHC: 32.9 g/dL (ref 30.0–36.0)
MCV: 87.1 fL (ref 78.0–100.0)
Monocytes Absolute: 0.6 10*3/uL (ref 0.1–1.0)
Neutro Abs: 5.3 10*3/uL (ref 1.7–7.7)
RBC: 4.02 MIL/uL — ABNORMAL LOW (ref 4.22–5.81)
WBC: 6.4 10*3/uL (ref 4.0–10.5)

## 2013-09-02 LAB — URINALYSIS, ROUTINE W REFLEX MICROSCOPIC
Bilirubin Urine: NEGATIVE
Glucose, UA: NEGATIVE mg/dL
Ketones, ur: NEGATIVE mg/dL
Leukocytes, UA: NEGATIVE
Protein, ur: NEGATIVE mg/dL
pH: 5 (ref 5.0–8.0)

## 2013-09-02 LAB — COMPREHENSIVE METABOLIC PANEL
ALT: 11 U/L (ref 0–53)
AST: 19 U/L (ref 0–37)
CO2: 24 mEq/L (ref 19–32)
Calcium: 9.1 mg/dL (ref 8.4–10.5)
Chloride: 105 mEq/L (ref 96–112)
Creatinine, Ser: 0.86 mg/dL (ref 0.50–1.35)
GFR calc Af Amer: >90 mL/min (ref >90)
GFR calc non Af Amer: 86 mL/min — ABNORMAL LOW (ref >90)
Glucose, Bld: 122 mg/dL — ABNORMAL HIGH (ref 70–99)
Sodium: 140 mEq/L (ref 135–145)
Total Bilirubin: 0.3 mg/dL (ref 0.3–1.2)

## 2013-09-02 LAB — STREP PNEUMONIAE URINARY ANTIGEN: Strep Pneumo Urinary Antigen: NEGATIVE

## 2013-09-02 LAB — INFLUENZA PANEL BY PCR (TYPE A & B)
H1N1 flu by pcr: NOT DETECTED
Influenza B By PCR: NEGATIVE

## 2013-09-02 MED ORDER — POTASSIUM CHLORIDE CRYS ER 20 MEQ PO TBCR
40.0000 meq | EXTENDED_RELEASE_TABLET | Freq: Two times a day (BID) | ORAL | Status: DC
  Administered 2013-09-02 – 2013-09-05 (×7): 40 meq via ORAL
  Filled 2013-09-02 (×8): qty 2

## 2013-09-02 MED ORDER — PIPERACILLIN-TAZOBACTAM 3.375 G IVPB 30 MIN: 3.3750 g | Freq: Once | INTRAVENOUS | Status: DC

## 2013-09-02 MED ORDER — ONDANSETRON HCL 4 MG/2ML IJ SOLN: 4.0000 mg | Freq: Four times a day (QID) | INTRAMUSCULAR | Status: DC | PRN

## 2013-09-02 MED ORDER — LISINOPRIL 2.5 MG PO TABS
2.5000 mg | ORAL_TABLET | Freq: Every day | ORAL | Status: DC
  Administered 2013-09-02 – 2013-09-05 (×4): 2.5 mg via ORAL
  Filled 2013-09-02 (×4): qty 1

## 2013-09-02 MED ORDER — TIOTROPIUM BROMIDE MONOHYDRATE 18 MCG IN CAPS
18.0000 ug | ORAL_CAPSULE | Freq: Every day | RESPIRATORY_TRACT | Status: DC
  Administered 2013-09-03 – 2013-09-05 (×3): 18 ug via RESPIRATORY_TRACT
  Filled 2013-09-02: qty 5

## 2013-09-02 MED ORDER — CARVEDILOL 3.125 MG PO TABS
3.1250 mg | ORAL_TABLET | Freq: Two times a day (BID) | ORAL | Status: DC
  Administered 2013-09-02 – 2013-09-05 (×7): 3.125 mg via ORAL
  Filled 2013-09-02 (×8): qty 1

## 2013-09-02 MED ORDER — SODIUM CHLORIDE 0.9 % IJ SOLN
3.0000 mL | Freq: Two times a day (BID) | INTRAMUSCULAR | Status: DC
  Administered 2013-09-02 – 2013-09-05 (×6): 3 mL via INTRAVENOUS

## 2013-09-02 MED ORDER — ONDANSETRON HCL 4 MG PO TABS: 4.0000 mg | ORAL_TABLET | Freq: Four times a day (QID) | ORAL | Status: DC | PRN

## 2013-09-02 MED ORDER — ATORVASTATIN CALCIUM 10 MG PO TABS
10.0000 mg | ORAL_TABLET | Freq: Every day | ORAL | Status: DC
  Administered 2013-09-02 – 2013-09-05 (×4): 10 mg via ORAL
  Filled 2013-09-02 (×4): qty 1

## 2013-09-02 MED ORDER — SODIUM CHLORIDE 0.9 % IV SOLN
INTRAVENOUS | Status: AC
  Administered 2013-09-02: 09:00:00 via INTRAVENOUS

## 2013-09-02 MED ORDER — OSELTAMIVIR PHOSPHATE 75 MG PO CAPS
75.0000 mg | ORAL_CAPSULE | Freq: Two times a day (BID) | ORAL | Status: DC
  Administered 2013-09-02 – 2013-09-05 (×8): 75 mg via ORAL
  Filled 2013-09-02 (×11): qty 1

## 2013-09-02 MED ORDER — ENOXAPARIN SODIUM 40 MG/0.4ML ~~LOC~~ SOLN
40.0000 mg | SUBCUTANEOUS | Status: DC
  Administered 2013-09-02 – 2013-09-04 (×3): 40 mg via SUBCUTANEOUS
  Filled 2013-09-02 (×4): qty 0.4

## 2013-09-02 MED ORDER — VANCOMYCIN HCL IN DEXTROSE 750-5 MG/150ML-% IV SOLN
750.0000 mg | Freq: Two times a day (BID) | INTRAVENOUS | Status: DC
  Administered 2013-09-02: 750 mg via INTRAVENOUS
  Filled 2013-09-02 (×3): qty 150

## 2013-09-02 MED ORDER — VANCOMYCIN HCL IN DEXTROSE 1-5 GM/200ML-% IV SOLN
1000.0000 mg | Freq: Once | INTRAVENOUS | Status: AC
  Administered 2013-09-02: 1000 mg via INTRAVENOUS
  Filled 2013-09-02: qty 200

## 2013-09-02 MED ORDER — ASPIRIN EC 81 MG PO TBEC
81.0000 mg | DELAYED_RELEASE_TABLET | Freq: Every day | ORAL | Status: DC
  Administered 2013-09-02 – 2013-09-05 (×4): 81 mg via ORAL
  Filled 2013-09-02 (×4): qty 1

## 2013-09-02 MED ORDER — ACETAMINOPHEN 650 MG RE SUPP: 650.0000 mg | Freq: Four times a day (QID) | RECTAL | Status: DC | PRN

## 2013-09-02 MED ORDER — HYDROCHLOROTHIAZIDE 25 MG PO TABS
25.0000 mg | ORAL_TABLET | Freq: Every day | ORAL | Status: DC
  Administered 2013-09-02 – 2013-09-05 (×4): 25 mg via ORAL
  Filled 2013-09-02 (×4): qty 1

## 2013-09-02 MED ORDER — DEXTROSE 5 % IV SOLN
1.0000 g | Freq: Three times a day (TID) | INTRAVENOUS | Status: DC
  Administered 2013-09-02 – 2013-09-03 (×4): 1 g via INTRAVENOUS
  Filled 2013-09-02 (×6): qty 1

## 2013-09-02 MED ORDER — HYDROCODONE-ACETAMINOPHEN 5-325 MG PO TABS
1.0000 | ORAL_TABLET | ORAL | Status: DC | PRN
  Administered 2013-09-02: 1 via ORAL
  Administered 2013-09-02: 2 via ORAL
  Administered 2013-09-04: 1 via ORAL
  Filled 2013-09-02: qty 2
  Filled 2013-09-02 (×2): qty 1

## 2013-09-02 MED ORDER — CLOPIDOGREL BISULFATE 75 MG PO TABS
75.0000 mg | ORAL_TABLET | Freq: Every day | ORAL | Status: DC
  Administered 2013-09-02 – 2013-09-05 (×4): 75 mg via ORAL
  Filled 2013-09-02 (×4): qty 1

## 2013-09-02 MED ORDER — ACETAMINOPHEN 325 MG PO TABS: 650.0000 mg | ORAL_TABLET | Freq: Four times a day (QID) | ORAL | Status: DC | PRN

## 2013-09-02 MED ORDER — LEVOFLOXACIN IN D5W 750 MG/150ML IV SOLN: 750.0000 mg | Freq: Once | INTRAVENOUS | Status: DC

## 2013-09-02 MED ORDER — NITROGLYCERIN 0.4 MG SL SUBL
0.4000 mg | SUBLINGUAL_TABLET | SUBLINGUAL | Status: DC | PRN
  Administered 2013-09-04 (×2): 0.4 mg via SUBLINGUAL
  Filled 2013-09-02: qty 25

## 2013-09-02 NOTE — Progress Notes (Signed)
Influenza A. positive on PCR Continue Tamiflu Consider discontinuing antibiotics in a.m., if cultures negative and chest x-ray negative  Laylana Gerwig, MD, FACP, FHM. [facility] Pager [phone]  If 7PM-7AM, please contact night-coverage [email or web address] Password [facility] 09/02/2013, 6:55 PM

## 2013-09-02 NOTE — Progress Notes (Signed)
ANTIBIOTIC CONSULT NOTE - INITIAL  Pharmacy Consult for Vancomycin/Cefepime Indication: rule out pneumonia  Allergies  Allergen Reactions  . Aspirin     GI upset  . Influenza Vaccines     Pt declines  . Pneumococcal Vaccines     Pt declines    Patient Measurements:   Adjusted Body Weight:    Vital Signs: Temp: 101.5 F (38.6 C) (12/24 0627) Temp src: Oral (12/24 0627) BP: 156/69 mmHg (12/24 0722) Pulse Rate: 77 (12/24 0722) Intake/Output from previous day:   Intake/Output from this shift:    Labs:  Recent Labs  09/02/13 0500  WBC 6.4  HGB 11.5*  PLT 164  CREATININE 0.86   The CrCl is unknown because both a height and weight (above a minimum accepted value) are required for this calculation. No results found for this basename: VANCOTROUGH, VANCOPEAK, VANCORANDOM, GENTTROUGH, GENTPEAK, GENTRANDOM, TOBRATROUGH, TOBRAPEAK, TOBRARND, AMIKACINPEAK, AMIKACINTROU, AMIKACIN,  in the last 72 hours   Microbiology: No results found for this or any previous visit (from the past 720 hour(s)).  Medical History: Past Medical History  Diagnosis Date  . Dyslipidemia     on statin  . LBBB (left bundle branch block)   . CAD (coronary artery disease)     s/p stent RCA DES 9/09; s/p Myoview 2013 with reduced EF to 40%; no ischemia  . HTN (hypertension)     Reports intolerance to hydralazine; not a candidate for beta blockers due to bradycardia; failed on ACE and ARB  . Noncompliance   . LV dysfunction     EF about 40 to 45% per echo in April 2013  . LVH (left ventricular hypertrophy)   . COPD (chronic obstructive pulmonary disease) dx 06/2013    PFTs 07/08/13: mod obst with resp to bronchodilator, moderate decrease diffusion, airtrapping  . Chronic low back pain     Medications: see med red  Assessment: Cough, SOB  72 y/o M with recent MI+stents 2 weeks ago, now presents with SOB and cough with known h/o COPD. Plan to start abx for HCAP and Tamiflu empirically. Scr 0.86  with estimated CrC. 77. WBC 6.4. LFT's WNL  Goal of Therapy:  Vancomycin trough level 15-20 mcg/ml  Plan:  F/u flu panel results Cefepime 1g IV q8hr 8d Vanco 1g IV x 1, then 750mg  IV q12h hrs x 8d. Trough after 3-5 doses at steady state.  **Since recent MI, please assure symptoms of fatigue, SOB, and cough are not caused by new beta-blocker and lisinopril. Thanks!**    Armetta Henri S. Merilynn Finland, PharmD, BCPS Clinical Staff Pharmacist Pager [phone]   Misty Stanley Stillinger 09/02/2013,8:25 AM

## 2013-09-02 NOTE — Care Management Note (Signed)
    Page 1 of 1   09/02/2013     12:58:27 PM   CARE MANAGEMENT NOTE 09/02/2013  Patient:  THIEN, BERKA   Account Number:  [id number]  Date Initiated:  09/02/2013  Documentation initiated by:  [facility]  Subjective/Objective Assessment:   72 yo male Chief Complaint: Fever, chills, cough, difficulty breathing and headache//Home with spouse     Action/Plan:   Droplet isolation and check flu panel PCR.  - Started empirically on Tamiflu which can be discontinued if influenza PCR negative.   Anticipated DC Date:  09/04/2013   Anticipated DC Plan:  HOME/SELF CARE      DC Planning Services  CM consult      Choice offered to / List presented to:             Status of service:  In process, will continue to follow Medicare Important Message given?   (If response is "NO", the following Medicare IM given date fields will be blank) Date Medicare IM given:   Date Additional Medicare IM given:    Discharge Disposition:    Per UR Regulation:    If discussed at Long Length of Stay Meetings, dates discussed:    Comments:  09/02/13 1245 Oletta Cohn, RN, BSN, [place] [phone] Contact: Cushman,Dorothy Spouse [phone] Harte,Sahr Son [phone]

## 2013-09-02 NOTE — ED Provider Notes (Signed)
CSN: [id number]     Arrival date & time 09/02/13  0408 History   First MD Initiated Contact with Patient 09/02/13 [phone]     Chief Complaint  Patient presents with  . Cough  . Shortness of Breath   (Consider location/radiation/quality/duration/timing/severity/associated sxs/prior Treatment) HPI Patient had MI 2 weeks ago and had stent placed at [facility]. She does have a history of COPD. He presents with nonproductive cough and shortness of breath starting yesterday evening around 5 PM. He denies any chest pain. He took Robitussin at home with little relief. He is febrile in emergency department and admits to generalized fatigue. She has had no nausea, vomiting, diarrhea or abdominal pain. He denies any lower extremity swelling or pain. Past Medical History  Diagnosis Date  . Dyslipidemia     on statin  . LBBB (left bundle branch block)   . CAD (coronary artery disease)     s/p stent RCA DES 9/09; s/p Myoview 2013 with reduced EF to 40%; no ischemia  . HTN (hypertension)     Reports intolerance to hydralazine; not a candidate for beta blockers due to bradycardia; failed on ACE and ARB  . Noncompliance   . LV dysfunction     EF about 40 to 45% per echo in April 2013  . LVH (left ventricular hypertrophy)   . COPD (chronic obstructive pulmonary disease) dx 06/2013    PFTs 07/08/13: mod obst with resp to bronchodilator, moderate decrease diffusion, airtrapping  . Chronic low back pain    Past Surgical History  Procedure Laterality Date  . Cardiac catheterization      ejection fraction 50%   History reviewed. No pertinent family history. History  Substance Use Topics  . Smoking status: Former Games developer  . Smokeless tobacco: Not on file     Comment: born in [place], raised in [place], works as Engineer, materials  . Alcohol Use: No    Review of Systems  Constitutional: Positive for fever, chills and fatigue.  Respiratory: Positive for cough and shortness of breath.  Negative for wheezing.   Cardiovascular: Negative for chest pain and leg swelling.  Gastrointestinal: Negative for nausea, vomiting and abdominal pain.  Musculoskeletal: Negative for back pain, neck pain and neck stiffness.  Skin: Negative for rash and wound.  Neurological: Positive for weakness (Generalized). Negative for dizziness, numbness and headaches.    Allergies  Aspirin; Influenza vaccines; and Pneumococcal vaccines  Home Medications   Current Outpatient Rx  Name  Route  Sig  Dispense  Refill  . acetaminophen (TYLENOL) 500 MG tablet   Oral   Take 1 tablet (500 mg total) by mouth every 6 (six) hours as needed for mild pain, moderate pain or fever.   30 tablet   0   . albuterol (PROVENTIL HFA;VENTOLIN HFA) 108 (90 BASE) MCG/ACT inhaler   Inhalation   Inhale 1 puff into the lungs every 6 (six) hours as needed for wheezing or shortness of breath.         [name] aspirin EC 81 MG tablet   Oral   Take 1 tablet (81 mg total) by mouth daily.   150 tablet   2   . atorvastatin (LIPITOR) 10 MG tablet   Oral   Take 1 tablet (10 mg total) by mouth daily.   90 tablet   3   . carvedilol (COREG) 3.125 MG tablet   Oral   Take 1 tablet (3.125 mg total) by mouth 2 (two) times daily with a  meal.         . clopidogrel (PLAVIX) 75 MG tablet   Oral   Take 1 tablet (75 mg total) by mouth daily.   90 tablet   3   . guaiFENesin (ROBITUSSIN) 100 MG/5ML SOLN   Oral   Take 5 mLs by mouth every 4 (four) hours as needed for cough or to loosen phlegm.         . hydrochlorothiazide (HYDRODIURIL) 25 MG tablet   Oral   Take 1 tablet (25 mg total) by mouth daily.   90 tablet   3   . HYDROcodone-acetaminophen (NORCO/VICODIN) 5-325 MG per tablet   Oral   Take 1 tablet by mouth at bedtime as needed for severe pain.   30 tablet   0   . lisinopril (PRINIVIL,ZESTRIL) 2.5 MG tablet   Oral   Take 1 tablet (2.5 mg total) by mouth daily.         . nitroGLYCERIN (NITROSTAT) 0.4 MG SL  tablet   Sublingual   Place 1 tablet (0.4 mg total) under the tongue every 5 (five) minutes as needed for chest pain.   100 tablet   3   . potassium chloride SA (K-DUR,KLOR-CON) 20 MEQ tablet      Take 2 tablets twice daily.   120 tablet   2   . tiotropium (SPIRIVA HANDIHALER) 18 MCG inhalation capsule   Inhalation   Place 1 capsule (18 mcg total) into inhaler and inhale daily.   30 capsule   11    BP 156/69  Pulse 77  Temp(Src) 101.5 F (38.6 C) (Oral)  Resp 16  SpO2 96% Physical Exam  Nursing note and vitals reviewed. Constitutional: He is oriented to person, place, and time. He appears well-developed and well-nourished. No distress.  HENT:  Head: Normocephalic and atraumatic.  Mouth/Throat: Oropharynx is clear and moist.  Eyes: EOM are normal. Pupils are equal, round, and reactive to light.  Neck: Normal range of motion. Neck supple.  Cardiovascular: Normal rate and regular rhythm.   Pulmonary/Chest: Effort normal. No respiratory distress. He has no wheezes. He has rales (diffuse coarse breath sounds without definite wheezing.).  Abdominal: Soft. Bowel sounds are normal. He exhibits no distension and no mass. There is no tenderness. There is no rebound and no guarding.  Musculoskeletal: Normal range of motion. He exhibits no edema and no tenderness.  No calf tenderness or swelling.  Neurological: He is alert and oriented to person, place, and time.  Patient is alert and oriented x3 with clear, goal oriented speech. Patient has 5/5 motor in all extremities. Sensation is intact to light touch.   Skin: Skin is warm and dry. No rash noted. No erythema.  Psychiatric: He has a normal mood and affect. His behavior is normal.    ED Course  Procedures (including critical care time) Labs Review Labs Reviewed  CBC WITH DIFFERENTIAL - Abnormal; Notable for the following:    RBC 4.02 (*)    Hemoglobin 11.5 (*)    HCT 35.0 (*)    Neutrophils Relative % 82 (*)    Lymphocytes  Relative 7 (*)    Lymphs Abs 0.5 (*)    All other components within normal limits  COMPREHENSIVE METABOLIC PANEL - Abnormal; Notable for the following:    Glucose, Bld 122 (*)    GFR calc non Af Amer 86 (*)    All other components within normal limits  PRO B NATRIURETIC PEPTIDE - Abnormal; Notable for the following:  Pro B Natriuretic peptide (BNP) 423.4 (*)    All other components within normal limits  TROPONIN I  URINALYSIS, ROUTINE W REFLEX MICROSCOPIC   Imaging Review Dg Chest 2 View  09/02/2013   CLINICAL DATA:  Persistent dry cough and chest pain.  EXAM: CHEST  2 VIEW  COMPARISON:  Chest radiograph performed 08/15/2013  FINDINGS: The lungs are well-aerated. Mild bilateral linear opacities likely reflect atelectasis. Peribronchial thickening is seen. There is no evidence of pleural effusion or pneumothorax.  The heart is borderline normal in size. No acute osseous abnormalities are seen.  IMPRESSION: Mild bilateral linear opacities likely reflect atelectasis; peribronchial thickening seen. Lungs otherwise clear.   Electronically Signed   By: Roanna Raider M.D.   On: 09/02/2013 05:21    EKG Interpretation    Date/Time:  Wednesday September 02 2013 04:18:58 EST Ventricular Rate:  80 PR Interval:  221 QRS Duration: 161 QT Interval:  384 QTC Calculation: 443 R Axis:   -140 Text Interpretation:  Right and left arm electrode reversal, interpretation assumes no reversal Sinus rhythm Prolonged PR interval Nonspecific intraventricular conduction delay Probable lateral infarct, age indeterminate Confirmed by Shaunika Italiano  MD, Shaterica Mcclatchy [phone]) on 09/02/2013 7:37:22 AM            MDM  Given recent MI and comorbidities as well as fever in the emergency department with cough and shortness of breath, concerned about pneumonia and possibly hospital-acquired pneumonia. Will ask [facility] hospitalist to evaluate.  Discussed with [facility]. Advised starting broad spectrum antibiotics with vancomycin  and Zosyn. Also advises getting blood cultures, flu panel and starting Tamiflu.  Loren Racer, MD 09/02/13 [phone]

## 2013-09-02 NOTE — Progress Notes (Signed)
Utilization Review Completed Dessire Grimes J. Taydem Cavagnaro, RN, BSN, NCM [phone]  

## 2013-09-02 NOTE — ED Notes (Signed)
Pt was seen at [facility] 2 weeks ago for MI, Had stents placed, Yesterday at 1700 pt had dry cough with shortness of breath, pt took robitussin at home to help with cough

## 2013-09-02 NOTE — H&P (Signed)
[facility]  History and Physical  [name]:[id number] DOB: September 10, 1941 DOA: 09/02/2013  Referring physician: EDP PCP: Rene Paci, MD  Outpatient Specialists:  1. Cardiology: Dr. Peter Swaziland  Chief Complaint: Fever, chills, cough, difficulty breathing and headache  HPI: [name] is a 72 y.o. male with history of NSTEMI, cardiac cath 08/16/13 at [facility], no new stents is unable to cross wire to first diagonals and the first OM, CAD, dyslipidemia, hypertension, COPD, former smoker-quit one month ago, chronic low back pain presented to the ED with above complaints. While at work on 09/01/13, he started experiencing dry hacking cough, chills and headache at approximately 5:30 PM. When he returned home from work at 11:30 PM, she noticed some fevers but did not check his temperature, took a tablet of hydrocodone and some Robitussin and went to sleep. He woke up at 2:30 AM today with continued dry hacking cough, difficulty breathing associated with coughing and some anterior chest pain also associated with coughing. He denies nausea, vomiting, abdominal pain, diarrhea, dysuria, skin rash, insect bites, sickly contacts or recent travel. He does complain of mild pain on the left side of his throat and urinary frequency Due to persistent symptoms, he presented to the ED where temperature was 101.35F, proBNP 423, hemoglobin 11.5, urine microscopy negative for UTI features & chest x-ray suggests mild bilateral linear opacities likely reflecting atelectasis, peribronchial thickening. Hospitalist admission requested. Patient has declined influenza and pneumonia shots in the past due to intolerance.   Review of Systems: All systems reviewed and apart from history of presenting illness, are negative.  Past Medical History  Diagnosis Date  . Dyslipidemia     on statin  . LBBB (left bundle branch block)   . CAD (coronary artery disease)     s/p stent RCA DES  9/09; s/p Myoview 2013 with reduced EF to 40%; no ischemia  . HTN (hypertension)     Reports intolerance to hydralazine; not a candidate for beta blockers due to bradycardia; failed on ACE and ARB  . Noncompliance   . LV dysfunction     EF about 40 to 45% per echo in April 2013  . LVH (left ventricular hypertrophy)   . COPD (chronic obstructive pulmonary disease) dx 06/2013    PFTs 07/08/13: mod obst with resp to bronchodilator, moderate decrease diffusion, airtrapping  . Chronic low back pain    Past Surgical History  Procedure Laterality Date  . Cardiac catheterization      ejection fraction 50%   Social History:  reports that he has quit smoking. He does not have any smokeless tobacco history on file. He reports that he does not drink alcohol or use illicit drugs. Married. Independent of activities of daily living. Works as a Electrical engineer.  Allergies  Allergen Reactions  . Aspirin     GI upset  . Influenza Vaccines     Pt declines  . Pneumococcal Vaccines     Pt declines    History reviewed. No pertinent family history. parents deceased. Negative family history.  Prior to Admission medications   Medication Sig Start Date End Date Taking? Authorizing Provider  acetaminophen (TYLENOL) 500 MG tablet Take 1 tablet (500 mg total) by mouth every 6 (six) hours as needed for mild pain, moderate pain or fever. 07/31/13  Yes Newt Lukes, MD  albuterol (PROVENTIL HFA;VENTOLIN HFA) 108 (90 BASE) MCG/ACT inhaler Inhale 1 puff into the lungs every 6 (six) hours as needed for wheezing  or shortness of breath.   Yes Historical Provider, MD  aspirin EC 81 MG tablet Take 1 tablet (81 mg total) by mouth daily. 08/24/13  Yes Newt Lukes, MD  atorvastatin (LIPITOR) 10 MG tablet Take 1 tablet (10 mg total) by mouth daily. 06/18/13 06/18/14 Yes Peter M Swaziland, MD  carvedilol (COREG) 3.125 MG tablet Take 1 tablet (3.125 mg total) by mouth 2 (two) times daily with a meal. 08/24/13  Yes  Newt Lukes, MD  clopidogrel (PLAVIX) 75 MG tablet Take 1 tablet (75 mg total) by mouth daily. 06/18/13  Yes Peter M Swaziland, MD  guaiFENesin (ROBITUSSIN) 100 MG/5ML SOLN Take 5 mLs by mouth every 4 (four) hours as needed for cough or to loosen phlegm.   Yes Historical Provider, MD  hydrochlorothiazide (HYDRODIURIL) 25 MG tablet Take 1 tablet (25 mg total) by mouth daily. 06/18/13 06/18/14 Yes Peter M Swaziland, MD  HYDROcodone-acetaminophen (NORCO/VICODIN) 5-325 MG per tablet Take 1 tablet by mouth at bedtime as needed for severe pain. 08/24/13  Yes Newt Lukes, MD  lisinopril (PRINIVIL,ZESTRIL) 2.5 MG tablet Take 1 tablet (2.5 mg total) by mouth daily. 08/24/13  Yes Newt Lukes, MD  nitroGLYCERIN (NITROSTAT) 0.4 MG SL tablet Place 1 tablet (0.4 mg total) under the tongue every 5 (five) minutes as needed for chest pain. 05/21/12 09/02/13 Yes Peter M Swaziland, MD  potassium chloride SA (K-DUR,KLOR-CON) 20 MEQ tablet Take 2 tablets twice daily. 10/14/12  Yes Peter M Swaziland, MD  tiotropium (SPIRIVA HANDIHALER) 18 MCG inhalation capsule Place 1 capsule (18 mcg total) into inhaler and inhale daily. 07/31/13  Yes Newt Lukes, MD   Physical Exam: Filed Vitals:   09/02/13 0427 09/02/13 0627 09/02/13 0722  BP:  152/98 156/69  Pulse:  80 77  Temp:  101.5 F (38.6 C)   TempSrc:  Oral   Resp:   16  SpO2: 98% 96% 96%     General exam: Moderately built and nourished male patient, lying comfortably supine on the gurney in no obvious distress.  Head, eyes and ENT: Nontraumatic and normocephalic. Pupils equally reacting to light and accommodation. Oral mucosa slightly dry.  Neck: Supple. No JVD, carotid bruit or thyromegaly.  Lymphatics: No lymphadenopathy.  Respiratory system: Few crackles in the right base. Rest of lung fields are clear to auscultation. No increased work of breathing.  Cardiovascular system: S1 and S2 heard, RRR. No JVD, murmurs, gallops, clicks or pedal  edema.  Gastrointestinal system: Abdomen is nondistended, soft and nontender. Normal bowel sounds heard. No organomegaly or masses appreciated.  Central nervous system: Alert and oriented. No focal neurological deficits.  Extremities: Symmetric 5 x 5 power. Peripheral pulses symmetrically felt.   Skin: No rashes or acute findings.  Musculoskeletal system: Negative exam.  Psychiatry: Pleasant and cooperative.   Labs on Admission:  Basic Metabolic Panel:  Recent Labs Lab 09/02/13 0500  NA 140  K 3.9  CL 105  CO2 24  GLUCOSE 122*  BUN 12  CREATININE 0.86  CALCIUM 9.1   Liver Function Tests:  Recent Labs Lab 09/02/13 0500  AST 19  ALT 11  ALKPHOS 99  BILITOT 0.3  PROT 7.5  ALBUMIN 3.7   No results found for this basename: LIPASE, AMYLASE,  in the last 168 hours No results found for this basename: AMMONIA,  in the last 168 hours CBC:  Recent Labs Lab 09/02/13 0500  WBC 6.4  NEUTROABS 5.3  HGB 11.5*  HCT 35.0*  MCV 87.1  PLT 164   Cardiac Enzymes:  Recent Labs Lab 09/02/13 0500  TROPONINI <0.30    BNP (last 3 results)  Recent Labs  06/18/13 1038 09/02/13 0500  PROBNP 55.0 423.4*   CBG: No results found for this basename: GLUCAP,  in the last 168 hours  Radiological Exams on Admission: Dg Chest 2 View  09/02/2013   CLINICAL DATA:  Persistent dry cough and chest pain.  EXAM: CHEST  2 VIEW  COMPARISON:  Chest radiograph performed 08/15/2013  FINDINGS: The lungs are well-aerated. Mild bilateral linear opacities likely reflect atelectasis. Peribronchial thickening is seen. There is no evidence of pleural effusion or pneumothorax.  The heart is borderline normal in size. No acute osseous abnormalities are seen.  IMPRESSION: Mild bilateral linear opacities likely reflect atelectasis; peribronchial thickening seen. Lungs otherwise clear.   Electronically Signed   By: Roanna Raider M.D.   On: 09/02/2013 05:21    EKG: Independently reviewed. Sinus  rhythm, LBBB (old), LAD.  Assessment/Plan Principal Problem:   Flu-like symptoms Active Problems:   LBBB (left bundle branch block)   HTN (hypertension)   CAD (coronary artery disease)   COPD (chronic obstructive pulmonary disease)   Pneumonia   Anemia   Febrile illness/flulike symptoms - Admit to telemetry - Droplet isolation and check flu panel PCR. - Started empirically on Tamiflu which can be discontinued if influenza PCR negative. - Followup blood culture results. Symptomatic treatment with antipyretics, Mucinex and Robitussin. Brief IV fluid hydration.  Possible HCAP - Recent hospitalization - Follow blood culture results, urinary streptococcal and Legionella antigen. - Start empiric IV cefepime and vancomycin. Brief IV fluid hydration - Follow chest x-ray in a.m., post hydration.  Anemia - Follow CBC in a.m.  Hypertension - Mildly uncontrolled. Continue home medications.  History of CAD/recent NSTEMI - Assigned to medical management. Asymptomatic of ischemic type of chest pain, troponin negative and no acute changes on EKG. - Continue aspirin, Plavix, beta blockers and statins.  COPD - Stable. Management as above.     Code Status: Full   Family Communication: None at bedside   Disposition Plan: Home when medically stable   Time spent: 60 minutes   Niema Carrara, MD, FACP, FHM. [facility] Pager [phone]  If 7PM-7AM, please contact night-coverage [email or web address] Password [email or web address] 09/02/2013, 8:22 AM

## 2013-09-02 NOTE — ED Notes (Signed)
[place] given

## 2013-09-03 ENCOUNTER — Inpatient Hospital Stay (HOSPITAL_COMMUNITY): Payer: Medicare Other

## 2013-09-03 LAB — BASIC METABOLIC PANEL
CO2: 26 mEq/L (ref 19–32)
Calcium: 8.9 mg/dL (ref 8.4–10.5)
Creatinine, Ser: 0.9 mg/dL (ref 0.50–1.35)
GFR calc non Af Amer: 84 mL/min — ABNORMAL LOW (ref >90)
Glucose, Bld: 138 mg/dL — ABNORMAL HIGH (ref 70–99)
Sodium: 137 mEq/L (ref 135–145)

## 2013-09-03 LAB — CBC
HCT: 35 % — ABNORMAL LOW (ref 39.0–52.0)
Hemoglobin: 11.2 g/dL — ABNORMAL LOW (ref 13.0–17.0)
MCH: 28 pg (ref 26.0–34.0)
MCV: 87.5 fL (ref 78.0–100.0)
RBC: 4 MIL/uL — ABNORMAL LOW (ref 4.22–5.81)

## 2013-09-03 LAB — LEGIONELLA ANTIGEN, URINE: Legionella Antigen, Urine: NEGATIVE

## 2013-09-03 LAB — MAGNESIUM: Magnesium: 1.7 mg/dL (ref 1.5–2.5)

## 2013-09-03 MED ORDER — GUAIFENESIN-DM 100-10 MG/5ML PO SYRP
5.0000 mL | ORAL_SOLUTION | ORAL | Status: DC | PRN
Start: 2013-09-03 — End: 2013-09-05
  Administered 2013-09-03 (×2): 5 mL via ORAL
  Filled 2013-09-03 (×2): qty 5

## 2013-09-03 MED ORDER — GUAIFENESIN ER 600 MG PO TB12
600.0000 mg | ORAL_TABLET | Freq: Two times a day (BID) | ORAL | Status: DC
  Administered 2013-09-03 – 2013-09-05 (×6): 600 mg via ORAL
  Filled 2013-09-03 (×6): qty 1

## 2013-09-03 NOTE — Progress Notes (Signed)
[facility] PROGRESS NOTE    KIEREN ADKISON FAO:[id number] DOB: 06/21/1941 DOA: 09/02/2013 PCP: Rene Paci, MD  HPI/Brief narrative 72 y.o. male with history of NSTEMI, cardiac cath 08/16/13 at [facility], no new stents is unable to cross wire to first diagonals and the first OM, CAD, dyslipidemia, hypertension, COPD, former smoker-quit one month ago, chronic low back pain presented to the ED on 12/24 with complaints of fever, chills, nonproductive dry hacking cough, dyspnea and headaches. In the ED, temperature was 101.6F, proBNP 423, hemoglobin 11.5, urine microscopy negative for UTI features & chest x-ray suggests mild bilateral linear opacities likely reflecting atelectasis, peribronchial thickening. Patient ruled in for influenza A. He was admitted for further management.  Assessment/Plan:  Influenza A - Continue supportive management. Patient is on droplet isolation. Continue Tamiflu. Repeat chest x-ray does not show focal acute finding, urine Legionella and streptococcal antigen negative. Discontinued antibiotics. - Improving.  Anemia and leukopenia - Likely related to influenza. Hemoglobin stable. Follow CBC in a.m.  Hypertension - reasonably controlled. Continue home medications.  History of CAD/recent NSTEMI  - Assigned to medical management. Asymptomatic of ischemic type of chest pain, troponin negative and no acute changes on EKG.  - Continue aspirin, Plavix, beta blockers and statins.   COPD  - Stable. Management as above.  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 Will repeat 2-D echo. Continue monitoring on telemetry. If he has further episodes, will consult his primary cardiologist. Potassium appears okay. Check magnesium. Continue carvedilol.    Code Status: Full Family Communication: None at bedside Disposition Plan: Home when medically  stable   Consultants:  None  Procedures:  None  Antibiotics:  IV cefepime and vancomycin 12/24 > 12/25   Subjective: Dry hacking cough with associated anterior chest and upper abdominal pain from coughing. Dyspnea better. Overall feels better than yesterday.  Objective: Filed Vitals:   09/02/13 2057 09/03/13 0555 09/03/13 0848 09/03/13 0900  BP: 150/64 154/77  148/56  Pulse: 72 61  64  Temp: 99.9 F (37.7 C) 98.9 F (37.2 C)  98.2 F (36.8 C)  TempSrc: Oral Oral  Oral  Resp: 21 20  20   Height:      Weight:  64.229 kg (141 lb 9.6 oz)    SpO2: 97% 97% 96% 97%    Intake/Output Summary (Last 24 hours) at 09/03/13 1347 Last data filed at 09/03/13 0900  Gross per 24 hour  Intake 1358.75 ml  Output    250 ml  Net 1108.75 ml   Filed Weights   09/02/13 0722 09/02/13 1049 09/03/13 0555  Weight: 67.9 kg (149 lb 11.1 oz) 64.2 kg (141 lb 8.6 oz) 64.229 kg (141 lb 9.6 oz)     Exam:  General exam: Moderately built and nourished male lying comfortably in bed. Appears better than he did yesterday in the ED. Respiratory system: Clear. No increased work of breathing. Cardiovascular system: S1 & S2 heard, RRR. No JVD, murmurs, gallops, clicks or pedal edema. Telemetry: Sinus rhythm. Per nurse's report, a 5 beat NSVT. Gastrointestinal system: Abdomen is nondistended, soft and nontender. Normal bowel sounds heard. Central nervous system: Alert and oriented. No focal neurological deficits. Extremities: Symmetric 5 x 5 power.   Data Reviewed: Basic Metabolic Panel:  Recent Labs Lab 09/02/13 0500  NA 140  K 3.9  CL 105  CO2 24  GLUCOSE 122*  BUN 12  CREATININE 0.86  CALCIUM 9.1   Liver Function Tests:  Recent Labs Lab 09/02/13 0500  AST 19  ALT 11  ALKPHOS 99  BILITOT 0.3  PROT 7.5  ALBUMIN 3.7   No results found for this basename: LIPASE, AMYLASE,  in the last 168 hours No results found for this basename: AMMONIA,  in the last 168 hours CBC:  Recent  Labs Lab 09/02/13 0500 09/03/13 0614  WBC 6.4 3.8*  NEUTROABS 5.3  --   HGB 11.5* 11.2*  HCT 35.0* 35.0*  MCV 87.1 87.5  PLT 164 161   Cardiac Enzymes:  Recent Labs Lab 09/02/13 0500  TROPONINI <0.30   BNP (last 3 results)  Recent Labs  06/18/13 1038 09/02/13 0500  PROBNP 55.0 423.4*   CBG: No results found for this basename: GLUCAP,  in the last 168 hours  Recent Results (from the past 240 hour(s))  CULTURE, BLOOD (ROUTINE X 2)     Status: None   Collection Time    09/02/13  8:15 AM      Result Value Range Status   Specimen Description BLOOD RIGHT FOREARM   Final   Special Requests     Final   Value: BOTTLES DRAWN AEROBIC AND ANAEROBIC AERO 10CC ANA 7CC   Culture  Setup Time     Final   Value: 09/02/2013 14:19     Performed at [facility]   Culture     Final   Value:        BLOOD CULTURE RECEIVED NO GROWTH TO DATE CULTURE WILL BE HELD FOR 5 DAYS BEFORE ISSUING A FINAL NEGATIVE REPORT     Performed at [facility]   Report Status PENDING   Incomplete  CULTURE, BLOOD (ROUTINE X 2)     Status: None   Collection Time    09/02/13  8:25 AM      Result Value Range Status   Specimen Description BLOOD LEFT ANTECUBITAL   Final   Special Requests BOTTLES DRAWN AEROBIC AND ANAEROBIC 10CCS   Final   Culture  Setup Time     Final   Value: 09/02/2013 14:21     Performed at [facility]   Culture     Final   Value:        BLOOD CULTURE RECEIVED NO GROWTH TO DATE CULTURE WILL BE HELD FOR 5 DAYS BEFORE ISSUING A FINAL NEGATIVE REPORT     Performed at [facility]   Report Status PENDING   Incomplete          Studies: X-ray Chest Pa And Lateral   09/03/2013   CLINICAL DATA:  Cough and shortness of breath  EXAM: CHEST  2 VIEW  COMPARISON:  09/02/2013  FINDINGS: 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 The aorta is unfolded and ectatic. Heart size is mildly enlarged. No new focal pulmonary opacity or pneumothorax. No pleural effusion. No  acute osseous finding.  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   Electronically Signed   By: Christiana Pellant M.D.   On: 09/03/2013 08:24   Dg Chest 2 View  09/02/2013   CLINICAL DATA:  Persistent dry cough and chest pain.  EXAM: CHEST  2 VIEW  COMPARISON:  Chest radiograph performed 08/15/2013  FINDINGS: The lungs are well-aerated. Mild bilateral linear opacities likely reflect atelectasis. Peribronchial thickening is seen. There is no evidence of pleural effusion or pneumothorax.  The heart is borderline normal in size. No acute osseous abnormalities are seen.  IMPRESSION: Mild bilateral linear opacities likely reflect atelectasis; peribronchial thickening  seen. Lungs otherwise clear.   Electronically Signed   By: Roanna Raider M.D.   On: 09/02/2013 05:21        Scheduled Meds: . aspirin EC  81 mg Oral Daily  . atorvastatin  10 mg Oral Daily  . carvedilol  3.125 mg Oral BID WC  . clopidogrel  75 mg Oral Daily  . enoxaparin (LOVENOX) injection  40 mg Subcutaneous Q24H  . guaiFENesin  600 mg Oral BID  . hydrochlorothiazide  25 mg Oral Daily  . lisinopril  2.5 mg Oral Daily  . oseltamivir  75 mg Oral BID  . potassium chloride SA  40 mEq Oral BID  . sodium chloride  3 mL Intravenous Q12H  . tiotropium  18 mcg Inhalation Daily   Continuous Infusions:   Principal Problem:   Flu-like symptoms Active Problems:   LBBB (left bundle branch block)   HTN (hypertension)   CAD (coronary artery disease)   COPD (chronic obstructive pulmonary disease)   Pneumonia   Anemia    Time spent: 45 minutes    Maheen Cwikla, MD, FACP, FHM. [facility] Pager [phone]  If 7PM-7AM, please contact night-coverage [email or web address] Password [email or web address] 09/03/2013, 1:47 PM    LOS: 1 day

## 2013-09-04 ENCOUNTER — Encounter (HOSPITAL_COMMUNITY): Payer: Self-pay | Admitting: Physician Assistant

## 2013-09-04 DIAGNOSIS — I4729 Other ventricular tachycardia: Secondary | ICD-10-CM | POA: Insufficient documentation

## 2013-09-04 DIAGNOSIS — I517 Cardiomegaly: Secondary | ICD-10-CM

## 2013-09-04 DIAGNOSIS — J111 Influenza due to unidentified influenza virus with other respiratory manifestations: Secondary | ICD-10-CM

## 2013-09-04 DIAGNOSIS — I472 Ventricular tachycardia: Secondary | ICD-10-CM

## 2013-09-04 DIAGNOSIS — I2510 Atherosclerotic heart disease of native coronary artery without angina pectoris: Secondary | ICD-10-CM

## 2013-09-04 MED ORDER — MAGNESIUM SULFATE 40 MG/ML IJ SOLN
2.0000 g | Freq: Once | INTRAMUSCULAR | Status: AC
  Administered 2013-09-04: 16:00:00 2 g via INTRAVENOUS
  Filled 2013-09-04: qty 50

## 2013-09-04 NOTE — Progress Notes (Signed)
  Echocardiogram 2D Echocardiogram has been performed.  Cathie Beams 09/04/2013, 2:31 PM

## 2013-09-04 NOTE — Progress Notes (Signed)
Pt c/o chest pain 8/10. VS taken and recorded . 02 given at 2lnc. Nitrostat 0.4 mg given SL.Daphane Shepherd under [facility] made aware with orders noted. 2nd dose of nitro tab given since  pt still c/o 5/10 chest pain. Observe patient closely.

## 2013-09-04 NOTE — Progress Notes (Signed)
[facility] PROGRESS NOTE    [name]:[id number] DOB: [birth year] DOA: 09/02/2013 PCP: Rene Paci, MD  HPI/Brief narrative 72 y.o. male with history of NSTEMI, cardiac cath 08/16/13 at [facility], no new stents is unable to cross wire to first diagonals and the first OM, CAD, dyslipidemia, hypertension, COPD, former smoker-quit one month ago, chronic low back pain presented to the ED on 12/24 with complaints of fever, chills, nonproductive dry hacking cough, dyspnea and headaches. In the ED, temperature was 101.66F, proBNP 423, hemoglobin 11.5, urine microscopy negative for UTI features & chest x-ray suggests mild bilateral linear opacities likely reflecting atelectasis, peribronchial thickening. Patient ruled in for influenza A. He was admitted for further management.  Assessment/Plan:  Influenza A - Continue supportive management. Patient is on droplet isolation. Continue Tamiflu. Repeat chest x-ray does not show focal acute finding, urine Legionella and streptococcal antigen negative. Discontinued antibiotics. - Continues to improve.  Anemia and leukopenia - Likely related to influenza. Stable hemoglobin.  Hypertension - reasonably controlled. Continue home medications.  History of CAD/recent NSTEMI  - Assigned to medical management. Asymptomatic of ischemic type of chest pain, troponin negative and no acute changes on EKG.  - Continue aspirin, Plavix, beta blockers and statins.   COPD  - Stable. Management as above.  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 Will repeat 2-D echo. Continue monitoring on telemetry. cardiology input appreciated-replaced magnesium to greater than 2, continue current dose of beta blockers and follow 2-D echo.    Code Status: Full Family Communication: None at bedside Disposition Plan: Home when medically stable, pending echo results and replacing magnesium-possibly 12/27     Consultants:  None  Procedures:  None  Antibiotics:  IV cefepime and vancomycin 12/24 > 12/25   Subjective:  Cough and pleuritic chest pain have improved.  Objective: Filed Vitals:   09/04/13 0549 09/04/13 0900 09/04/13 1021 09/04/13 1300  BP: 135/68 128/66  133/74  Pulse: 73 74  63  Temp: 99.5 F (37.5 C) 98.3 F (36.8 C)  97.8 F (36.6 C)  TempSrc: Oral Oral  Tympanic  Resp: 18 20  20   Height:      Weight: 63.005 kg (138 lb 14.4 oz)     SpO2: 93% 96% 94% 97%    Intake/Output Summary (Last 24 hours) at 09/04/13 1539 Last data filed at 09/04/13 1534  Gross per 24 hour  Intake   1203 ml  Output   1200 ml  Net      3 ml   Filed Weights   09/02/13 1049 09/03/13 0555 09/04/13 0549  Weight: 64.2 kg (141 lb 8.6 oz) 64.229 kg (141 lb 9.6 oz) 63.005 kg (138 lb 14.4 oz)     Exam:  General exam: Moderately built and nourished male lying comfortably in bed.  Respiratory system: Clear. No increased work of breathing. Cardiovascular system: S1 & S2 heard, RRR. No JVD, murmurs, gallops, clicks or pedal edema. Telemetry: Sinus rhythm.  Single episode of asymptomatic NSVT on 12/25. No further episodes Gastrointestinal system: Abdomen is nondistended, soft and nontender. Normal bowel sounds heard. Central nervous system: Alert and oriented. No focal neurological deficits. Extremities: Symmetric 5 x 5 power.   Data Reviewed: Basic Metabolic Panel:  Recent Labs Lab 09/02/13 0500 09/03/13 1445  NA 140 137  K 3.9 4.1  CL 105 100  CO2 24 26  GLUCOSE 122* 138*  BUN 12 11  CREATININE 0.86 0.90  CALCIUM  9.1 8.9  MG  --  1.7   Liver Function Tests:  Recent Labs Lab 09/02/13 0500  AST 19  ALT 11  ALKPHOS 99  BILITOT 0.3  PROT 7.5  ALBUMIN 3.7   No results found for this basename: LIPASE, AMYLASE,  in the last 168 hours No results found for this basename: AMMONIA,  in the last 168 hours CBC:  Recent Labs Lab 09/02/13 0500 09/03/13 0614  WBC 6.4  3.8*  NEUTROABS 5.3  --   HGB 11.5* 11.2*  HCT 35.0* 35.0*  MCV 87.1 87.5  PLT 164 161   Cardiac Enzymes:  Recent Labs Lab 09/02/13 0500  TROPONINI <0.30   BNP (last 3 results)  Recent Labs  06/18/13 1038 09/02/13 0500  PROBNP 55.0 423.4*   CBG: No results found for this basename: GLUCAP,  in the last 168 hours  Recent Results (from the past 240 hour(s))  CULTURE, BLOOD (ROUTINE X 2)     Status: None   Collection Time    09/02/13  8:15 AM      Result Value Range Status   Specimen Description BLOOD RIGHT FOREARM   Final   Special Requests     Final   Value: BOTTLES DRAWN AEROBIC AND ANAEROBIC AERO 10CC ANA 7CC   Culture  Setup Time     Final   Value: 09/02/2013 14:19     Performed at [facility]   Culture     Final   Value:        BLOOD CULTURE RECEIVED NO GROWTH TO DATE CULTURE WILL BE HELD FOR 5 DAYS BEFORE ISSUING A FINAL NEGATIVE REPORT     Performed at [facility]   Report Status PENDING   Incomplete  CULTURE, BLOOD (ROUTINE X 2)     Status: None   Collection Time    09/02/13  8:25 AM      Result Value Range Status   Specimen Description BLOOD LEFT ANTECUBITAL   Final   Special Requests BOTTLES DRAWN AEROBIC AND ANAEROBIC 10CCS   Final   Culture  Setup Time     Final   Value: 09/02/2013 14:21     Performed at [facility]   Culture     Final   Value:        BLOOD CULTURE RECEIVED NO GROWTH TO DATE CULTURE WILL BE HELD FOR 5 DAYS BEFORE ISSUING A FINAL NEGATIVE REPORT     Performed at [facility]   Report Status PENDING   Incomplete          Studies: X-ray Chest Pa And Lateral   09/03/2013   CLINICAL DATA:  Cough and shortness of breath  EXAM: CHEST  2 VIEW  COMPARISON:  09/02/2013  FINDINGS: 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 The aorta is unfolded and ectatic. Heart size is mildly enlarged. No new focal pulmonary opacity or pneumothorax. No pleural effusion. No acute osseous finding.  IMPRESSION: Emphysematous  changes are reidentified without focal acute finding.   Electronically Signed   By: Christiana Pellant M.D.   On: 09/03/2013 08:24        Scheduled Meds: . aspirin EC  81 mg Oral Daily  . atorvastatin  10 mg Oral Daily  . carvedilol  3.125 mg Oral BID WC  . clopidogrel  75 mg Oral Daily  . enoxaparin (LOVENOX) injection  40 mg Subcutaneous Q24H  . guaiFENesin  600 mg Oral BID  . hydrochlorothiazide  25 mg Oral Daily  .  lisinopril  2.5 mg Oral Daily  . magnesium sulfate 1 - 4 g bolus IVPB  2 g Intravenous Once  . oseltamivir  75 mg Oral BID  . potassium chloride SA  40 mEq Oral BID  . sodium chloride  3 mL Intravenous Q12H  . tiotropium  18 mcg Inhalation Daily   Continuous Infusions:   Principal Problem:   Flu-like symptoms Active Problems:   LBBB (left bundle branch block)   HTN (hypertension)   CAD (coronary artery disease)   COPD (chronic obstructive pulmonary disease)   Pneumonia   Anemia   NSVT (nonsustained ventricular tachycardia)    Time spent: 20 minutes    Sidney Kann, MD, FACP, FHM. [facility] Pager [phone]  If 7PM-7AM, please contact night-coverage [email or web address] Password [email or web address] 09/04/2013, 3:39 PM    LOS: 2 days

## 2013-09-04 NOTE — Progress Notes (Signed)
Pt still coughing but lesser than on admission. Tele =NSR.no c/o CP noir SOB. Continued to monitor

## 2013-09-04 NOTE — Consult Note (Addendum)
CARDIOLOGY CONSULT NOTE   Patient ID: DONYE CAMPANELLI MRN: [id number] DOB/AGE: 06-03-1941 72 y.o.  Admit date: 09/02/2013  Primary Physician   Rene Paci, MD Primary Cardiologist   Dr. [place] Reason for Consultation  NSVT  HPI: EUCLID CASSETTA is a 72 y.o. male with a history of HTN, HLD, LV dysfunction (EF  40-45% per echo in 12/2011), COPD, CAD s/p DES to RCA in 05/2008, NSTEMI (peak trp 4.3) cath on 08/16/2013 with no stent), history of tobacco abuse, and non compliance who was admitted yesterday for inlfuenza (confirmed by PCR). He has been with fevers, cough, respiratory distress and chest pain secondary to cough. Cardiology was consulted when 7 beats of non sustained ventricular tachycardia was noted at 11:30 this morning. He denies any palpitations on this admission. He said that he gets palpitations from time to time but the last time    Past Medical History  Diagnosis Date  . Dyslipidemia     on statin  . LBBB (left bundle branch block)   . CAD (coronary artery disease)     s/p stent RCA DES 9/09; s/p Myoview 2013 with reduced EF to 40%; no ischemia  . HTN (hypertension)     Reports intolerance to hydralazine; no beta blockers 2/2 bradycardia; failed on ACE and ARB  . Noncompliance   . LV dysfunction     EF about 40 to 45% per echo in April 2013  . LVH (left ventricular hypertrophy)   . COPD (chronic obstructive pulmonary disease) dx 06/2013    PFTs 07/08/13: mod obst with resp to bronchodilator, moderate decrease diffusion, airtrapping  . Chronic low back pain      Past Surgical History  Procedure Laterality Date  . Cardiac catheterization      ejection fraction 50%    Allergies  Allergen Reactions  . Aspirin     GI upset  . Influenza Vaccines     Pt declines  . Pneumococcal Vaccines     Pt declines    I have reviewed the patient's current medications . aspirin EC  81 mg Oral Daily  . atorvastatin  10 mg Oral Daily  . carvedilol  3.125 mg Oral  BID WC  . clopidogrel  75 mg Oral Daily  . enoxaparin (LOVENOX) injection  40 mg Subcutaneous Q24H  . guaiFENesin  600 mg Oral BID  . hydrochlorothiazide  25 mg Oral Daily  . lisinopril  2.5 mg Oral Daily  . oseltamivir  75 mg Oral BID  . potassium chloride SA  40 mEq Oral BID  . sodium chloride  3 mL Intravenous Q12H  . tiotropium  18 mcg Inhalation Daily     acetaminophen, acetaminophen, guaiFENesin-dextromethorphan, HYDROcodone-acetaminophen, nitroGLYCERIN, ondansetron (ZOFRAN) IV, ondansetron  Prior to Admission medications   Medication Sig Start Date End Date Taking? Authorizing Provider  acetaminophen (TYLENOL) 500 MG tablet Take 1 tablet (500 mg total) by mouth every 6 (six) hours as needed for mild pain, moderate pain or fever. 07/31/13  Yes Newt Lukes, MD  albuterol (PROVENTIL HFA;VENTOLIN HFA) 108 (90 BASE) MCG/ACT inhaler Inhale 1 puff into the lungs every 6 (six) hours as needed for wheezing or shortness of breath.   Yes Historical Provider, MD  aspirin EC 81 MG tablet Take 1 tablet (81 mg total) by mouth daily. 08/24/13  Yes Newt Lukes, MD  atorvastatin (LIPITOR) 10 MG tablet Take 1 tablet (10 mg total) by mouth daily. 06/18/13 06/18/14 Yes Peter M [place], MD  carvedilol (COREG) 3.125 MG tablet Take 1 tablet (3.125 mg total) by mouth 2 (two) times daily with a meal. 08/24/13  Yes Newt Lukes, MD  clopidogrel (PLAVIX) 75 MG tablet Take 1 tablet (75 mg total) by mouth daily. 06/18/13  Yes Peter M [place], MD  guaiFENesin (ROBITUSSIN) 100 MG/5ML SOLN Take 5 mLs by mouth every 4 (four) hours as needed for cough or to loosen phlegm.   Yes Historical Provider, MD  hydrochlorothiazide (HYDRODIURIL) 25 MG tablet Take 1 tablet (25 mg total) by mouth daily. 06/18/13 06/18/14 Yes Peter M [place], MD  HYDROcodone-acetaminophen (NORCO/VICODIN) 5-325 MG per tablet Take 1 tablet by mouth at bedtime as needed for severe pain. 08/24/13  Yes Newt Lukes, MD  lisinopril  (PRINIVIL,ZESTRIL) 2.5 MG tablet Take 1 tablet (2.5 mg total) by mouth daily. 08/24/13  Yes Newt Lukes, MD  nitroGLYCERIN (NITROSTAT) 0.4 MG SL tablet Place 1 tablet (0.4 mg total) under the tongue every 5 (five) minutes as needed for chest pain. 05/21/12 09/02/13 Yes Peter M [place], MD  potassium chloride SA (K-DUR,KLOR-CON) 20 MEQ tablet Take 2 tablets twice daily. 10/14/12  Yes Peter M [place], MD  tiotropium (SPIRIVA HANDIHALER) 18 MCG inhalation capsule Place 1 capsule (18 mcg total) into inhaler and inhale daily. 07/31/13  Yes Newt Lukes, MD     History   Social History  . Marital Status: Married    Spouse Name: N/A    Number of Children: 3  . Years of Education: N/A   Occupational History  . security guard.    Social History Main Topics  . Smoking status: Former Games developer  . Smokeless tobacco: Not on file     Comment: born in [place], raised in [place], works as Engineer, materials  . Alcohol Use: No  . Drug Use: No  . Sexual Activity: Not on file   Other Topics Concern  . Not on file   Social History Narrative  . No narrative on file    Family Status  Relation Status Death Age  . Mother Deceased 38  . Father Deceased 65   History reviewed. No pertinent family history.   ROS:  Full 14 point review of systems complete and found to be negative unless listed above.  Physical Exam: Blood pressure 133/74, pulse 63, temperature 97.8 F (36.6 C), temperature source Tympanic, resp. rate 20, height 5\' 8"  (1.727 m), weight 138 lb 14.4 oz (63.005 kg), SpO2 97.00%.  General exam: NAD Head, eyes and ENT: Nontraumatic and normocephalic. Pupils equally reacting to light and accommodation. Oral mucosa slightly dry.  Neck: Supple. No JVD, carotid bruit or thyromegaly.  Lymphatics: No lymphadenopathy. Respiratory system: Few crackles in the right base. Rest of lung fields are clear to auscultation. No increased work of breathing.  Cardiovascular system: S1 and S2 heard, RRR. No  JVD, murmurs, gallops, clicks or pedal edema. Gastrointestinal system: Abdomen is nondistended, soft and nontender. Normal bowel sounds heard.  NEURO Alert and oriented. No focal neurological deficits.  Extremities: Symmetric 5 x 5 power. Peripheral pulses symmetrically felt.  Skin: No rashes or acute findings.  Musculoskeletal system: Negative exam.  Psychiatry: Pleasant and cooperative.  Labs:   Lab Results  Component Value Date   WBC 3.8* 09/03/2013   HGB 11.2* 09/03/2013   HCT 35.0* 09/03/2013   MCV 87.5 09/03/2013   PLT 161 09/03/2013      Recent Labs Lab 09/02/13 0500 09/03/13 1445  NA 140 137  K 3.9 4.1  CL  105 100  CO2 24 26  BUN 12 11  CREATININE 0.86 0.90  CALCIUM 9.1 8.9  PROT 7.5  --   BILITOT 0.3  --   ALKPHOS 99  --   ALT 11  --   AST 19  --   GLUCOSE 122* 138*  ALBUMIN 3.7  --    Magnesium  Date Value Range Status  09/03/2013 1.7  1.5 - 2.5 mg/dL Final    Recent Labs  16/10/96 0500  TROPONINI <0.30    Pro B Natriuretic peptide (BNP)  Date/Time Value Range Status  09/02/2013  5:00 AM 423.4* 0 - 125 pg/mL Final  06/18/2013 10:38 AM 55.0  0.0 - 100.0 pg/mL Final    TSH  Date/Time Value Range Status  06/18/2013 10:38 AM 3.01  0.35 - 5.50 uIU/mL Final    Echo: pending, last ECHO in April 2013 showed EF 40-45%, severe LVH with global hypokinesis  Radiology:  X-ray Chest Pa And Lateral   09/03/2013   CLINICAL DATA:  Cough and shortness of breath  EXAM: CHEST  2 VIEW  COMPARISON:  09/02/2013  FINDINGS: 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 The aorta is unfolded and ectatic. Heart size is mildly enlarged. No new focal pulmonary opacity or pneumothorax. No pleural effusion. No acute osseous finding.  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      ASSESSMENT AND PLAN:    Principal Problem:   Flu-like symptoms Active Problems:   LBBB (left bundle branch block)   HTN (hypertension)   CAD (coronary artery  disease)   COPD (chronic obstructive pulmonary disease)   Pneumonia   Anemia  ONA RATHERT is a 72 y.o. male with a history of HTN, HLD, LV dysfunction (EF  40-45% per echo in 12/2011), COPD, CAD s/p DES to RCA in 05/2008, NSTEMI (peak trp 4.3) cath on 08/16/2013 with no stent), history of tobacco abuse, and non compliance who was admitted yesterday for inlfuenza (confirmed by PCR). Cardiology consulted for an episode of NSVT noted on telemetry.   NSVT x1 (7 beats)-- asymptomatic   -- Echo in 12/2011 showed EF 40-45%, severe LVH with global hypokinesis. Repeat ECHO pending.  -- Continue monitoring on telemetry -- Continue carvedilol 3.125 BID. BP 128/66, HR 74. Consider up-titration. However, it has been documented in the past that he is intolerant to beta blockers 2/2 bradycardia. HR may be elevated now with acute illness  History of CAD/recent NSTEMI  - s/p DES to RCA (2009) s/p. NSTEMI with PCI on 08/16/13 at [facility]. (Trop peak 4.3)- cath done, but as patient reports, "no new stent" as unable to cross wire to 1st diagonals and 1st OM. Myoview March 2013 without ischemia  - No chest pain currently.Troponin negative and no acute changes on EKG.  - Continue aspirin, Plavix, beta blockers and statins  Influenza A  - Continue supportive care and Tamiflu.    Anemia and leukopenia  - Likely related to influenza. Hemoglobin stable. Follow CBC.   Hypertension  - Continue home medications.   COPD  - Stable. Management as above.   Hypomagnesemia  - will supplement  Signed: Janeece Agee 09/04/2013 2:36 PM  Pager [phone]  Co-Sign MD  Patient seen and examined with Deborha Payment, PA-C. We discussed all aspects of the encounter. I agree with the assessment and plan as stated above.   CP is pleuritic and likely noncardiac in nature. Review of tele strips shows brief run of NSVT that was asymptomatic. Potassium  is ok. Magnesium is low. Will supplement Mag to get level >  = 2.0. Continue b-blocker.  Repeat echo has been ordered. No further recommendations at this time. Will sign off. Please call with questions.   Tateanna Bach,MD 2:44 PM

## 2013-09-05 DIAGNOSIS — J449 Chronic obstructive pulmonary disease, unspecified: Secondary | ICD-10-CM

## 2013-09-05 DIAGNOSIS — E785 Hyperlipidemia, unspecified: Secondary | ICD-10-CM

## 2013-09-05 DIAGNOSIS — I519 Heart disease, unspecified: Secondary | ICD-10-CM

## 2013-09-05 LAB — TROPONIN I
Troponin I: <0.3 ng/mL (ref <0.30)
Troponin I: <0.3 ng/mL (ref <0.30)

## 2013-09-05 LAB — POTASSIUM: Potassium: 4.6 mEq/L (ref 3.5–5.1)

## 2013-09-05 LAB — MAGNESIUM: Magnesium: 2 mg/dL (ref 1.5–2.5)

## 2013-09-05 MED ORDER — GUAIFENESIN ER 600 MG PO TB12: 600.0000 mg | ORAL_TABLET | Freq: Two times a day (BID) | ORAL | Status: DC

## 2013-09-05 MED ORDER — NITROGLYCERIN 0.4 MG SL SUBL: 0.4000 mg | SUBLINGUAL_TABLET | SUBLINGUAL | Status: DC | PRN

## 2013-09-05 MED ORDER — OSELTAMIVIR PHOSPHATE 75 MG PO CAPS: 75.0000 mg | ORAL_CAPSULE | Freq: Two times a day (BID) | ORAL | Status: DC

## 2013-09-05 NOTE — Progress Notes (Signed)
Pt d/c'd to home with family. D/c instruction given to and d/w pt. Pt verbalizes understanding. Pt in no resp distress at time of d/c.

## 2013-09-05 NOTE — Progress Notes (Signed)
Pt noted to have HR upper 40's. Checked pt no c/o chest pains nor SOB.

## 2013-09-05 NOTE — Progress Notes (Signed)
Pt with no c/o chest pains.. Noted on and off non productive cough. Continued to observe

## 2013-09-05 NOTE — Progress Notes (Signed)
SUBJECTIVE: 7-beat run of NSVT. EF now 45-50% with severe LVH. Has pleuritic chest pain which is resolving. Feels much better overall.     Intake/Output Summary (Last 24 hours) at 09/05/13 1048 Last data filed at 09/05/13 0900  Gross per 24 hour  Intake    740 ml  Output   1150 ml  Net   -410 ml    Current Facility-Administered Medications  Medication Dose Route Frequency Provider Last Rate Last Dose  . acetaminophen (TYLENOL) tablet 650 mg  650 mg Oral Q6H PRN Elease Etienne, MD       Or  . acetaminophen (TYLENOL) suppository 650 mg  650 mg Rectal Q6H PRN Elease Etienne, MD      . aspirin EC tablet 81 mg  81 mg Oral Daily Elease Etienne, MD   81 mg at 09/05/13 0953  . atorvastatin (LIPITOR) tablet 10 mg  10 mg Oral Daily Elease Etienne, MD   10 mg at 09/05/13 0951  . carvedilol (COREG) tablet 3.125 mg  3.125 mg Oral BID WC Elease Etienne, MD   3.125 mg at 09/05/13 1610  . clopidogrel (PLAVIX) tablet 75 mg  75 mg Oral Daily Elease Etienne, MD   75 mg at 09/05/13 0951  . enoxaparin (LOVENOX) injection 40 mg  40 mg Subcutaneous Q24H Elease Etienne, MD   40 mg at 09/04/13 1423  . guaiFENesin (MUCINEX) 12 hr tablet 600 mg  600 mg Oral BID Elease Etienne, MD   600 mg at 09/05/13 0951  . guaiFENesin-dextromethorphan (ROBITUSSIN DM) 100-10 MG/5ML syrup 5 mL  5 mL Oral Q4H PRN Elease Etienne, MD   5 mL at 09/03/13 2023  . hydrochlorothiazide (HYDRODIURIL) tablet 25 mg  25 mg Oral Daily Elease Etienne, MD   25 mg at 09/05/13 0951  . HYDROcodone-acetaminophen (NORCO/VICODIN) 5-325 MG per tablet 1-2 tablet  1-2 tablet Oral Q4H PRN Elease Etienne, MD   1 tablet at 09/04/13 2310  . lisinopril (PRINIVIL,ZESTRIL) tablet 2.5 mg  2.5 mg Oral Daily Elease Etienne, MD   2.5 mg at 09/05/13 0951  . nitroGLYCERIN (NITROSTAT) SL tablet 0.4 mg  0.4 mg Sublingual Q5 min PRN Elease Etienne, MD   0.4 mg at 09/04/13 2221  . ondansetron (ZOFRAN) tablet 4 mg  4 mg Oral Q6H PRN  Elease Etienne, MD       Or  . ondansetron (ZOFRAN) injection 4 mg  4 mg Intravenous Q6H PRN Elease Etienne, MD      . oseltamivir (TAMIFLU) capsule 75 mg  75 mg Oral BID Loren Racer, MD   75 mg at 09/05/13 0951  . potassium chloride SA (K-DUR,KLOR-CON) CR tablet 40 mEq  40 mEq Oral BID Elease Etienne, MD   40 mEq at 09/05/13 0951  . sodium chloride 0.9 % injection 3 mL  3 mL Intravenous Q12H Elease Etienne, MD   3 mL at 09/05/13 0953  . tiotropium (SPIRIVA) inhalation capsule 18 mcg  18 mcg Inhalation Daily Elease Etienne, MD   18 mcg at 09/05/13 0848    Filed Vitals:   09/04/13 1722 09/04/13 2201 09/05/13 0609 09/05/13 0848  BP: 154/74 131/59 118/54   Pulse: 77 59 61   Temp:  98 F (36.7 C) 97.9 F (36.6 C)   TempSrc:  Oral Oral   Resp:  20 20   Height:      Weight:  138 lb 3.2 oz (62.687 kg)   SpO2:  96% 94% 96%    PHYSICAL EXAM General: NAD Neck: No JVD, no thyromegaly or thyroid nodule.  Lungs: Clear to auscultation bilaterally with normal respiratory effort. CV: Nondisplaced PMI.  Regular rhythm, normal S1/S2, no S3/S4, no murmur.  No pretibial edema.  No carotid bruit.  Normal pedal pulses.  Abdomen: Soft, nontender, no hepatosplenomegaly, no distention.  Neurologic: Alert and oriented x 3.  Psych: Normal affect. Extremities: No clubbing or cyanosis.   TELEMETRY: Reviewed telemetry pt in sinus bradycardia, 50 bpm range, one 7-beat run of NSVT.  LABS: Basic Metabolic Panel:  Recent Labs  16/10/96 1445  NA 137  K 4.1  CL 100  CO2 26  GLUCOSE 138*  BUN 11  CREATININE 0.90  CALCIUM 8.9  MG 1.7   Liver Function Tests: No results found for this basename: AST, ALT, ALKPHOS, BILITOT, PROT, ALBUMIN,  in the last 72 hours No results found for this basename: LIPASE, AMYLASE,  in the last 72 hours CBC:  Recent Labs  09/03/13 0614  WBC 3.8*  HGB 11.2*  HCT 35.0*  MCV 87.5  PLT 161   Cardiac Enzymes:  Recent Labs  09/04/13 2344  09/05/13 0440  TROPONINI <0.30 <0.30   BNP: No components found with this basename: POCBNP,  D-Dimer: No results found for this basename: DDIMER,  in the last 72 hours Hemoglobin A1C: No results found for this basename: HGBA1C,  in the last 72 hours Fasting Lipid Panel: No results found for this basename: CHOL, HDL, LDLCALC, TRIG, CHOLHDL, LDLDIRECT,  in the last 72 hours Thyroid Function Tests: No results found for this basename: TSH, T4TOTAL, FREET3, T3FREE, THYROIDAB,  in the last 72 hours Anemia Panel: No results found for this basename: VITAMINB12, FOLATE, FERRITIN, TIBC, IRON, RETICCTPCT,  in the last 72 hours  RADIOLOGY: X-ray Chest Pa And Lateral   09/03/2013   CLINICAL DATA:  Cough and shortness of breath  EXAM: CHEST  2 VIEW  COMPARISON:  09/02/2013  FINDINGS: 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 The aorta is unfolded and ectatic. Heart size is mildly enlarged. No new focal pulmonary opacity or pneumothorax. No pleural effusion. No acute osseous finding.  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   Electronically Signed   By: Christiana Pellant M.D.   On: 09/03/2013 08:24   Dg Chest 2 View  09/02/2013   CLINICAL DATA:  Persistent dry cough and chest pain.  EXAM: CHEST  2 VIEW  COMPARISON:  Chest radiograph performed 08/15/2013  FINDINGS: The lungs are well-aerated. Mild bilateral linear opacities likely reflect atelectasis. Peribronchial thickening is seen. There is no evidence of pleural effusion or pneumothorax.  The heart is borderline normal in size. No acute osseous abnormalities are seen.  IMPRESSION: Mild bilateral linear opacities likely reflect atelectasis; peribronchial thickening seen. Lungs otherwise clear.   Electronically Signed   By: Roanna Raider M.D.   On: 09/02/2013 05:21      ASSESSMENT AND PLAN: 1. NSVT: had another 7-beat run. Will check magnesium level today with goal > 2, and repeat K with goal > 4. Unable to  increase beta blockers due to intolerance/bradycardia. Noted to have HR's in 40 bpm range, but this was while sleeping at night.  2. Ischemic cardiomyopathy, EF 45-50%: 3. CAD: symptomatically stable. Normal troponins. Continue ASA, Lipitor, Coreg, and Plavix. 4. HTN: controlled on present therapy which includes ACEI and HCTZ. Check K. 5. Influenza: continue Tamiflu and supportive care.  Dispo: no further recommendations at this time.   Prentice Docker, M.D., F.A.C.C.

## 2013-09-05 NOTE — Discharge Summary (Signed)
Physician Discharge Summary  ELWIN TSOU ZOX:[id number] DOB: 1941/06/28 DOA: 09/02/2013  PCP: Rene Paci, MD  Admit date: 09/02/2013 Discharge date: 09/05/2013  Time spent: Less than 30 minutes  Recommendations for Outpatient Follow-up:  1. Dr. Rene Paci, PCP in 1 week with repeat labs (CBC). 2. Dr. Peter Swaziland, Cardiology 3. Follow up final Blood Culture results that were sent from the hospital.  Discharge Diagnoses:  Principal Problem:   Flu-like symptoms Active Problems:   LBBB (left bundle branch block)   HTN (hypertension)   CAD (coronary artery disease)   COPD (chronic obstructive pulmonary disease)   Pneumonia   Anemia   NSVT (nonsustained ventricular tachycardia)   Discharge Condition: Improved & Stable  Diet recommendation: Heart Healthy diet.  Filed Weights   09/03/13 0555 09/04/13 0549 09/05/13 0609  Weight: 64.229 kg (141 lb 9.6 oz) 63.005 kg (138 lb 14.4 oz) 62.687 kg (138 lb 3.2 oz)    History of present illness:  72 y.o. male with history of NSTEMI, cardiac cath 08/16/13 at [facility], no new stents is unable to cross wire to first diagonals and the first OM, CAD, dyslipidemia, hypertension, COPD, former smoker-quit one month ago, chronic low back pain presented to the ED on 12/24 with complaints of fever, chills, nonproductive dry hacking cough, dyspnea and headaches. In the ED,  temperature was 101.68F, proBNP 423, hemoglobin 11.5, urine microscopy negative for UTI features & chest x-ray suggests mild bilateral linear opacities likely reflecting atelectasis, peribronchial thickening. Patient ruled in for influenza A. He was admitted for further management.  Hospital Course:   Influenza A  - Continue supportive management. Patient is on droplet isolation. Continue Tamiflu. Repeat chest x-ray does not show focal acute finding, urine Legionella and streptococcal antigen negative. Discontinued antibiotics.  - Continued to  improve.   Anemia and leukopenia  - Likely related to influenza. Stable hemoglobin.   Hypertension  - reasonably controlled. Continue home medications.   History of CAD/recent NSTEMI  - Assigned to medical management. Asymptomatic of ischemic type of chest pain, troponin negative and no acute changes on EKG.  - Continue aspirin, Plavix, beta blockers and statins.   COPD  - Stable. Management as above.   NSVT/ischemic cardiomyopathy - Echo April 2013 showed EF 40-45%, severe LVH with global hypokinesis. Replaced potassium and magnesium. Repeat echo shows LVEF 45-50%. Cardiology consulted: Unable to increased beta blockers due to intolerance/bradycardia-patient noted to have heart rates in the 40s while sleeping at night. Cardiology cleared patient for discharge home.     Consultations:  Cardiology  Procedures:  None    Discharge Exam:  Complaints:  Feels much better. Cough and anterior chest pain related to cough decreasing. No SOB. Had mild diarrhea yesterday morning- resolved.  Filed Vitals:   09/04/13 2201 09/05/13 0609 09/05/13 0848 09/05/13 1300  BP: 131/59 118/54  125/59  Pulse: 59 61  55  Temp: 98 F (36.7 C) 97.9 F (36.6 C)  98.4 F (36.9 C)  TempSrc: Oral Oral  Oral  Resp: 20 20  20   Height:      Weight:  62.687 kg (138 lb 3.2 oz)    SpO2: 96% 94% 96% 98%    General exam: Moderately built and nourished male lying comfortably in bed.  Respiratory system: Clear. No increased work of breathing.  Cardiovascular system: S1 & S2 heard, RRR. No JVD, murmurs, gallops, clicks or pedal edema. Telemetry: Sinus rhythm in 60's. SB in mid 40's over night. Episode of asymptomatic  7 beat NSVT on 12/25 and another this AM.  Gastrointestinal system: Abdomen is nondistended, soft and nontender. Normal bowel sounds heard.  Central nervous system: Alert and oriented. No focal neurological deficits.  Extremities: Symmetric 5 x 5 power.   Discharge Instructions       Discharge Orders   Future Appointments Provider Department Dept Phone   10/12/2013 10:30 AM Peter M Swaziland, MD [facility] [place] Office [phone]   Future Orders Complete By Expires   Call MD for:  difficulty breathing, headache or visual disturbances  As directed    Call MD for:  extreme fatigue  As directed    Call MD for:  persistant dizziness or light-headedness  As directed    Call MD for:  severe uncontrolled pain  As directed    Call MD for:  temperature >100.4  As directed    Diet - low sodium heart healthy  As directed    Increase activity slowly  As directed        Medication List         acetaminophen 500 MG tablet  Commonly known as:  TYLENOL  Take 1 tablet (500 mg total) by mouth every 6 (six) hours as needed for mild pain, moderate pain or fever.     albuterol 108 (90 BASE) MCG/ACT inhaler  Commonly known as:  PROVENTIL HFA;VENTOLIN HFA  Inhale 1 puff into the lungs every 6 (six) hours as needed for wheezing or shortness of breath.     aspirin EC 81 MG tablet  Take 1 tablet (81 mg total) by mouth daily.     atorvastatin 10 MG tablet  Commonly known as:  LIPITOR  Take 1 tablet (10 mg total) by mouth daily.     carvedilol 3.125 MG tablet  Commonly known as:  COREG  Take 1 tablet (3.125 mg total) by mouth 2 (two) times daily with a meal.     clopidogrel 75 MG tablet  Commonly known as:  PLAVIX  Take 1 tablet (75 mg total) by mouth daily.     guaiFENesin 100 MG/5ML Soln  Commonly known as:  ROBITUSSIN  Take 5 mLs by mouth every 4 (four) hours as needed for cough or to loosen phlegm.     guaiFENesin 600 MG 12 hr tablet  Commonly known as:  MUCINEX  Take 1 tablet (600 mg total) by mouth 2 (two) times daily.     hydrochlorothiazide 25 MG tablet  Commonly known as:  HYDRODIURIL  Take 1 tablet (25 mg total) by mouth daily.     HYDROcodone-acetaminophen 5-325 MG per tablet  Commonly known as:  NORCO/VICODIN  Take 1 tablet by mouth at bedtime as  needed for severe pain.     lisinopril 2.5 MG tablet  Commonly known as:  PRINIVIL,ZESTRIL  Take 1 tablet (2.5 mg total) by mouth daily.     nitroGLYCERIN 0.4 MG SL tablet  Commonly known as:  NITROSTAT  Place 1 tablet (0.4 mg total) under the tongue every 5 (five) minutes as needed for chest pain.     oseltamivir 75 MG capsule  Commonly known as:  TAMIFLU  Take 1 capsule (75 mg total) by mouth 2 (two) times daily.     potassium chloride SA 20 MEQ tablet  Commonly known as:  K-DUR,KLOR-CON  Take 2 tablets twice daily.     tiotropium 18 MCG inhalation capsule  Commonly known as:  SPIRIVA HANDIHALER  Place 1 capsule (18 mcg total) into inhaler and inhale  daily.       Follow-up Information   Follow up with Rene Paci, MD. Schedule an appointment as soon as possible for a visit in 1 week. (To be seen with repeat labs (CBC))    Specialty:  Internal Medicine   Contact information:   [address] [place] [address] [phone]       Schedule an appointment as soon as possible for a visit with Peter Swaziland, MD.   Specialty:  Cardiology   Contact information:   [address] [place] [address] [phone]        The results of significant diagnostics from this hospitalization (including imaging, microbiology, ancillary and laboratory) are listed below for reference.    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 The aorta is unfolded and ectatic. Heart size is mildly enlarged. No new focal pulmonary opacity or pneumothorax. No pleural effusion. No acute osseous finding.  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   Electronically Signed   By: Christiana Pellant M.D.   On: 09/03/2013 08:24   Dg Chest 2  View  09/02/2013   CLINICAL DATA:  Persistent dry cough and chest pain.  EXAM: CHEST  2 VIEW  COMPARISON:  Chest radiograph performed 08/15/2013  FINDINGS: The lungs are well-aerated. Mild bilateral linear opacities likely reflect atelectasis. Peribronchial thickening is seen. There is no evidence of pleural effusion or pneumothorax.  The heart is borderline normal in size. No acute osseous abnormalities are seen.  IMPRESSION: Mild bilateral linear opacities likely reflect atelectasis; peribronchial thickening seen. Lungs otherwise clear.   Electronically Signed   By: Roanna Raider M.D.   On: 09/02/2013 05:21    Microbiology: Recent Results (from the past 240 hour(s))  CULTURE, BLOOD (ROUTINE X 2)     Status: None   Collection Time    09/02/13  8:15 AM      Result Value Range Status   Specimen Description BLOOD RIGHT FOREARM   Final   Special Requests     Final   Value: BOTTLES DRAWN AEROBIC AND ANAEROBIC AERO 10CC ANA 7CC   Culture  Setup Time     Final   Value: 09/02/2013 14:19     Performed at [facility]   Culture     Final   Value:        BLOOD CULTURE RECEIVED NO GROWTH TO DATE CULTURE WILL BE HELD FOR 5 DAYS BEFORE ISSUING A FINAL NEGATIVE REPORT     Performed at [facility]   Report Status PENDING   Incomplete  CULTURE, BLOOD (ROUTINE X 2)     Status: None   Collection Time    09/02/13  8:25 AM      Result Value Range Status   Specimen Description BLOOD LEFT ANTECUBITAL   Final   Special Requests BOTTLES DRAWN AEROBIC AND ANAEROBIC 10CCS   Final   Culture  Setup Time     Final   Value: 09/02/2013 14:21     Performed at [facility]   Culture     Final   Value:        BLOOD CULTURE RECEIVED NO GROWTH TO DATE CULTURE WILL BE HELD FOR 5 DAYS BEFORE ISSUING A FINAL NEGATIVE REPORT  Performed at [facility]   Report Status PENDING   Incomplete     Labs: Basic Metabolic Panel:  Recent Labs Lab 09/02/13 0500 09/03/13 1445  09/05/13 1515  NA 140 137  --   K 3.9 4.1 4.6  CL 105 100  --   CO2 24 26  --   GLUCOSE 122* 138*  --   BUN 12 11  --   CREATININE 0.86 0.90  --   CALCIUM 9.1 8.9  --   MG  --  1.7 2.0   Liver Function Tests:  Recent Labs Lab 09/02/13 0500  AST 19  ALT 11  ALKPHOS 99  BILITOT 0.3  PROT 7.5  ALBUMIN 3.7   No results found for this basename: LIPASE, AMYLASE,  in the last 168 hours No results found for this basename: AMMONIA,  in the last 168 hours CBC:  Recent Labs Lab 09/02/13 0500 09/03/13 0614  WBC 6.4 3.8*  NEUTROABS 5.3  --   HGB 11.5* 11.2*  HCT 35.0* 35.0*  MCV 87.1 87.5  PLT 164 161   Cardiac Enzymes:  Recent Labs Lab 09/02/13 0500 09/04/13 2344 09/05/13 0440 09/05/13 1025  TROPONINI <0.30 <0.30 <0.30 <0.30   BNP: BNP (last 3 results)  Recent Labs  06/18/13 1038 09/02/13 0500  PROBNP 55.0 423.4*   CBG: No results found for this basename: GLUCAP,  in the last 168 hours    Signed:  Marcellus Scott, MD, FACP, FHM. [facility] Pager [phone]  If 7PM-7AM, please contact night-coverage [email or web address] Password [facility] 09/05/2013, 4:28 PM

## 2013-09-08 LAB — CULTURE, BLOOD (ROUTINE X 2): Culture: NO GROWTH

## 2013-10-05 ENCOUNTER — Encounter: Payer: Self-pay | Admitting: Internal Medicine

## 2013-10-05 ENCOUNTER — Ambulatory Visit (INDEPENDENT_AMBULATORY_CARE_PROVIDER_SITE_OTHER): Payer: Medicare Other | Admitting: Internal Medicine

## 2013-10-05 VITALS — BP 162/82 | HR 65 | Temp 98.2°F | Wt 145.0 lb

## 2013-10-05 DIAGNOSIS — I10 Essential (primary) hypertension: Secondary | ICD-10-CM

## 2013-10-05 DIAGNOSIS — M545 Low back pain, unspecified: Secondary | ICD-10-CM

## 2013-10-05 DIAGNOSIS — J449 Chronic obstructive pulmonary disease, unspecified: Secondary | ICD-10-CM

## 2013-10-05 DIAGNOSIS — J441 Chronic obstructive pulmonary disease with (acute) exacerbation: Secondary | ICD-10-CM

## 2013-10-05 DIAGNOSIS — J45901 Unspecified asthma with (acute) exacerbation: Principal | ICD-10-CM

## 2013-10-05 MED ORDER — FLUTICASONE-SALMETEROL 250-50 MCG/DOSE IN AEPB: 1.0000 | INHALATION_SPRAY | Freq: Two times a day (BID) | RESPIRATORY_TRACT | Status: DC

## 2013-10-05 MED ORDER — HYDROCODONE-ACETAMINOPHEN 5-325 MG PO TABS: 1.0000 | ORAL_TABLET | Freq: Every evening | ORAL | Status: DC | PRN

## 2013-10-05 MED ORDER — PREDNISONE (PAK) 10 MG PO TABS: ORAL_TABLET | ORAL | Status: DC

## 2013-10-05 MED ORDER — METHYLPREDNISOLONE ACETATE 80 MG/ML IJ SUSP
80.0000 mg | Freq: Once | INTRAMUSCULAR | Status: AC
  Administered 2013-10-05: 80 mg via INTRAMUSCULAR

## 2013-10-05 NOTE — Assessment & Plan Note (Signed)
There have been some probable medication, dietary and lifestyle compliance issues here. I have discussed with him the great importance of following the treatment plan exactly as directed in order to achieve a good medical outcome.  BP Readings from Last 3 Encounters:  10/05/13 162/82  09/05/13 125/59  08/24/13 142/70

## 2013-10-05 NOTE — Patient Instructions (Signed)
It was good to see you today.  We have reviewed your prior records from hospitalization including labs and tests today  Injection for cough and wheezing given to you in my office today (Medrol)  Medications reviewed and updated Take prednisone taper over next 6 days as described, Start Advair twice daily and continue ongoing Spiriva once daily with albuterol as needed for cough/wheeze Refill on hydrocodone today - do not use more than once daily!  Your prescription(s) have been submitted to your pharmacy (or given to you to take to your pharmacy). Please take as directed and contact our office if you believe you are having problem(s) with the medication(s).  we'll make referral to back specialist for your back pain and a pulmonary (lung) specialist. Our office will contact you regarding appointment(s) once made.  Continue to think about giving up cigarettes! Use nicotine gum, nicotine patches or electronic cigarettes. If you're interested in medication to help you quit, please call  - let me know how I can help!  Please schedule followup in 3 months, call sooner if problems.  Back Exercises Back exercises help treat and prevent back injuries. The goal is to increase your strength in your belly (abdominal) and back muscles. These exercises can also help with flexibility. Start these exercises when told by your doctor. HOME CARE Back exercises include: Pelvic Tilt.  Lie on your back with your knees bent. Tilt your pelvis until the lower part of your back is against the floor. Hold this position 5 to 10 sec. Repeat this exercise 5 to 10 times. Knee to Chest.  Pull 1 knee up against your chest and hold for 20 to 30 seconds. Repeat this with the other knee. This may be done with the other leg straight or bent, whichever feels better. Then, pull both knees up against your chest. Sit-Ups or Curl-Ups.  Bend your knees 90 degrees. Start with tilting your pelvis, and do a partial, slow sit-up.  Only lift your upper half 30 to 45 degrees off the floor. Take at least 2 to 3 seonds for each sit-up. Do not do sit-ups with your knees out straight. If partial sit-ups are difficult, simply do the above but with only tightening your belly (abdominal) muscles and holding it as told. Hip-Lift.  Lie on your back with your knees flexed 90 degrees. Push down with your feet and shoulders as you raise your hips 2 inches off the floor. Hold for 10 seconds, repeat 5 to 10 times. Back Arches.  Lie on your stomach. Prop yourself up on bent elbows. Slowly press on your hands, causing an arch in your low back. Repeat 3 to 5 times. Shoulder-Lifts.  Lie face down with arms beside your body. Keep hips and belly pressed to floor as you slowly lift your head and shoulders off the floor. Do not overdo your exercises. Be careful in the beginning. Exercises may cause you some mild back discomfort. If the pain lasts for more than 15 minutes, stop the exercises until you see your doctor. Improvement with exercise for back problems is slow.  Document Released: 09/29/2010 Document Revised: 11/19/2011 Document Reviewed: 06/28/2011 [facility] Patient Information 2014 [place]  Chronic Back Pain  When back pain lasts longer than 3 months, it is called chronic back pain.People with chronic back pain often go through certain periods that are more intense (flare-ups).  CAUSES Chronic back pain can be caused by wear and tear (degeneration) on different structures in your back. These structures include:  The bones of your spine (vertebrae) and the joints surrounding your spinal cord and nerve roots (facets).  The strong, fibrous tissues that connect your vertebrae (ligaments). Degeneration of these structures may result in pressure on your nerves. This can lead to constant pain. HOME CARE INSTRUCTIONS  Avoid bending, heavy lifting, prolonged sitting, and activities which make the problem worse.  Take brief  periods of rest throughout the day to reduce your pain. Lying down or standing usually is better than sitting while you are resting.  Take over-the-counter or prescription medicines only as directed by your caregiver. SEEK IMMEDIATE MEDICAL CARE IF:   You have weakness or numbness in one of your legs or feet.  You have trouble controlling your bladder or bowels.  You have nausea, vomiting, abdominal pain, shortness of breath, or fainting. Document Released: 10/04/2004 Document Revised: 11/19/2011 Document Reviewed: 08/11/2011 [facility] Patient Information 2014 [place], [place]. You Can Quit Smoking If you are ready to quit smoking or are thinking about it, congratulations! You have chosen to help yourself be healthier and live longer! There are lots of different ways to quit smoking. Nicotine gum, nicotine patches, a nicotine inhaler, or nicotine nasal spray can help with physical craving. Hypnosis, support groups, and medicines help break the habit of smoking. TIPS TO GET OFF AND STAY OFF CIGARETTES  Learn to predict your moods. Do not let a bad situation be your excuse to have a cigarette. Some situations in your life might tempt you to have a cigarette.  Ask friends and co-workers not to smoke around you.  Make your home smoke-free.  Never have "just one" cigarette. It leads to wanting another and another. Remind yourself of your decision to quit.  On a card, make a list of your reasons for not smoking. Read it at least the same number of times a day as you have a cigarette. Tell yourself everyday, "I do not want to smoke. I choose not to smoke."  Ask someone at home or work to help you with your plan to quit smoking.  Have something planned after you eat or have a cup of coffee. Take a walk or get other exercise to perk you up. This will help to keep you from overeating.  Try a relaxation exercise to calm you down and decrease your stress. Remember, you may be tense and nervous the  first two weeks after you quit. This will pass.  Find new activities to keep your hands busy. Play with a pen, coin, or rubber band. Doodle or draw things on paper.  Brush your teeth right after eating. This will help cut down the craving for the taste of tobacco after meals. You can try mouthwash too.  Try gum, breath mints, or diet candy to keep something in your mouth. IF YOU SMOKE AND WANT TO QUIT:  Do not stock up on cigarettes. Never buy a carton. Wait until one pack is finished before you buy another.  Never carry cigarettes with you at work or at home.  Keep cigarettes as far away from you as possible. Leave them with someone else.  Never carry matches or a lighter with you.  Ask yourself, "Do I need this cigarette or is this just a reflex?"  Bet with someone that you can quit. Put cigarette money in a piggy bank every morning. If you smoke, you give up the money. If you do not smoke, by the end of the week, you keep the money.  Keep trying. It takes  21 days to change a habit!  Talk to your doctor about using medicines to help you quit. These include nicotine replacement gum, lozenges, or skin patches. Document Released: 06/23/2009 Document Revised: 11/19/2011 Document Reviewed: 06/23/2009 [facility] Patient Information 2014 [place], [place].

## 2013-10-05 NOTE — Assessment & Plan Note (Signed)
Chronic symptoms missed prior referral to orthopedics due to hospitalization for URI December 2014.  Will repeat refer now and refill hydrocodone to use as needed until specialist evaluation

## 2013-10-05 NOTE — Progress Notes (Signed)
Pre-visit discussion using our clinic review tool. No additional management support is needed unless otherwise documented below in the visit note.  

## 2013-10-05 NOTE — Progress Notes (Signed)
Subjective:    Patient ID: [name], male    DOB: Sep 28, 1940, 73 y.o.   MRN: [id number]  HPI  Patient seen today in post hospital follow up.  Hospitalized for Influenza A December 2014.  Since discharge reports chronic non productive cough, wheezing, and fatigue. Increasing symptoms in past 72h depite use of Spiriva and Alb. Denies fevers or chills.    Chronic medical problems also reviewed -  Coronary artery disease. Status post stent (DES) September 2009 to RCA. Myoview March 2013 without ischemia - takes antiplatelet therapy Plavix only because of aspirin allergy -the patient reports compliance with medication(s) as prescribed. Denies adverse side effects.   Dyslipidemia. On statin for same. - the patient reports compliance with medication(s) as prescribed. Denies adverse side effects.   hypertension - history of variable compliance. Denies chest pain, change in chronic dyspnea on exertion, or edema   COPD - dx 07/2013 and begun on Spriva for same.  Takes Albuterol prn    Chronic low back pain - takes Norco regularly for same, referred to ortho 08/2013 but missed appt due to hospitalization.  Requesting refill on Norco today   Past Medical History  Diagnosis Date  . Dyslipidemia     on statin  . LBBB (left bundle branch block)   . CAD (coronary artery disease)     s/p stent RCA DES 9/09; s/p Myoview 2013 with reduced EF to 40%; no ischemia  . HTN (hypertension)     Reports intolerance to hydralazine; no beta blockers 2/2 bradycardia; failed on ACE and ARB  . Noncompliance   . LV dysfunction     EF about 40 to 45% per echo in April 2013  . LVH (left ventricular hypertrophy)   . COPD (chronic obstructive pulmonary disease) dx 06/2013    PFTs 07/08/13: mod obst with resp to bronchodilator, moderate decrease diffusion, airtrapping  . Chronic low back pain    History reviewed. No pertinent family history.  History  Substance Use Topics  . Smoking status: Former Games developermoker    . Smokeless tobacco: Not on file     Comment: [place], works as Engineer, materialssecurity officer  . Alcohol Use: No    Review of Systems  Constitutional: Positive for activity change (decreased activity since hospitalization 08/2013) and fatigue. Negative for fever, chills and appetite change.  HENT: Negative for congestion, ear pain, postnasal drip, rhinorrhea, sinus pressure and sore throat.   Eyes: Negative for discharge and itching.  Respiratory: Positive for cough (dry, non productive), shortness of breath (chronic) and wheezing.   Cardiovascular: Negative for chest pain, palpitations and leg swelling.  Gastrointestinal: Negative for nausea, vomiting, diarrhea and constipation.  Musculoskeletal: Positive for back pain (chronic). Negative for arthralgias.  Skin: Negative for rash and wound.  Neurological: Negative for dizziness, syncope and headaches.       Objective:   Physical Exam  Constitutional: He is oriented to person, place, and time. He appears well-developed and well-nourished. No distress.  HENT:  Head: Normocephalic and atraumatic.  Eyes: Conjunctivae and EOM are normal. Pupils are equal, round, and reactive to light.  Neck: Normal range of motion. Neck supple. No thyromegaly present.  Cardiovascular: Normal rate, regular rhythm and normal heart sounds.   No murmur heard. Pulmonary/Chest: Effort normal. No respiratory distress. He has wheezes (diffuse expiratiory). He has no rales. He exhibits no tenderness.  Musculoskeletal: Normal range of motion. He exhibits no edema and no tenderness.  Lymphadenopathy:  He has no cervical adenopathy.  Neurological: He is alert and oriented to person, place, and time.  Skin: Skin is warm and dry. No rash noted. He is not diaphoretic.  Psychiatric: He has a normal mood and affect. His behavior is normal. Judgment and thought content normal.    Wt Readings from Last 3 Encounters:  10/05/13 145 lb (65.772 kg)  09/05/13  138 lb 3.2 oz (62.687 kg)  08/24/13 149 lb 9.6 oz (67.858 kg)   BP Readings from Last 3 Encounters:  10/05/13 162/82  09/05/13 125/59  08/24/13 142/70   Lab Results  Component Value Date   WBC 3.8* 09/03/2013   HGB 11.2* 09/03/2013   HCT 35.0* 09/03/2013   PLT 161 09/03/2013   GLUCOSE 138* 09/03/2013   CHOL 208* 06/18/2013   TRIG 97.0 06/18/2013   HDL 54.20 06/18/2013   LDLDIRECT 132.9 06/18/2013   LDLCALC 68 11/12/2011   ALT 11 09/02/2013   AST 19 09/02/2013   NA 137 09/03/2013   K 4.6 09/05/2013   CL 100 09/03/2013   CREATININE 0.90 09/03/2013   BUN 11 09/03/2013   CO2 26 09/03/2013   TSH 3.01 06/18/2013   INR 1.01 07/19/2010       Assessment & Plan:   Acute COPD exacerbation. Increasing symptoms over past 72 hours. Treatment IM Medrol 80 mg and increase bronchodilator therapy. Add Advair to ongoing Spiriva with albuterol as needed. New prescription provided today. Also prednisone taper over next 6 days. No evidence for acute infection so we'll hold on empiric antibiotics. Refer to pulmonary for a continued evaluation and treatment  Time spent with pt today 40 minutes, greater than 50% time spent counseling patient on acute COPD exac, hosp for same 08/2013, chronic LBP, hypertension  and medication review. Also review of hospital records

## 2013-10-05 NOTE — Assessment & Plan Note (Signed)
New dx 06/2013 - reviewed PFTs ordered by cards for eval of chronic dyspnea on exertion started Spiriva daily and Alb rescue MDI prn - feels improved, but continued daily symptoms See above regarding acute exacerbation in past 72 hours following hospitalization for URI exacerbation December 2014 Medrol 80 mg IM today, initiate prednisone taper x6 days and add Advair 250/50 twice daily to ongoing medications Refer to pulmonary for specialist review and medication management Educated on relationship to tobacco abuse and improtance of smoking cessation to minimize disease progression

## 2013-10-06 ENCOUNTER — Telehealth: Payer: Self-pay | Admitting: Internal Medicine

## 2013-10-06 NOTE — Telephone Encounter (Signed)
Relevant patient education mailed to patient.  

## 2013-10-12 ENCOUNTER — Institutional Professional Consult (permissible substitution): Payer: Medicare Other | Admitting: Pulmonary Disease

## 2013-10-12 ENCOUNTER — Encounter: Payer: Self-pay | Admitting: Cardiology

## 2013-10-12 ENCOUNTER — Ambulatory Visit: Payer: Medicare Other | Admitting: Cardiology

## 2013-10-12 ENCOUNTER — Ambulatory Visit (INDEPENDENT_AMBULATORY_CARE_PROVIDER_SITE_OTHER): Payer: Medicare Other | Admitting: Cardiology

## 2013-10-12 VITALS — BP 152/80 | HR 65 | Ht 68.0 in | Wt 144.0 lb

## 2013-10-12 DIAGNOSIS — I10 Essential (primary) hypertension: Secondary | ICD-10-CM

## 2013-10-12 DIAGNOSIS — I2510 Atherosclerotic heart disease of native coronary artery without angina pectoris: Secondary | ICD-10-CM

## 2013-10-12 DIAGNOSIS — I447 Left bundle-branch block, unspecified: Secondary | ICD-10-CM

## 2013-10-12 DIAGNOSIS — I519 Heart disease, unspecified: Secondary | ICD-10-CM

## 2013-10-12 DIAGNOSIS — E785 Hyperlipidemia, unspecified: Secondary | ICD-10-CM

## 2013-10-12 NOTE — Patient Instructions (Signed)
Continue your current therapy  I will see you in 2 months.   

## 2013-10-12 NOTE — Progress Notes (Addendum)
[name] Date of Birth: 1941-07-11 Medical Record #[id number]  History of Present Illness: [name] is seen back today for a followup visit. He has multiple medical issues which include HTN, noncompliance, HLD and known CAD. He has a history of tobacco abuse. He has LV dysfunction with ejection fraction of 40-45%. He says he is intolerant to Hydralazine. Failed on ACE and ARB in the past. Not able to take higher beta blocker dose due to his resting bradycardia. His last echocardiogram in April 2013 showed an ejection fraction of 40-45% with diffuse hypokinesis. There was dyssynchrony related to his left branch block. He had mild mitral insufficiency. He had a Myoview study in March of 2013 which showed normal perfusion. Ejection fraction of 40%. On followup today he reports that he was admitted to [facility] in early December with a "small heart attack". He had a cardiac cath. Results are unknown. Apparently PCI was attempted but unsuccessful due to inability to cross lesion. Details are unknown. He is s/p stenting of the mid RCA with a 3.0x28 mm Promus stent. He was noted at that time to have occlusion of an OM branch. He was readmitted to [facility] hospital 2 weeks later with influenza A. Since then he states he has continued to struggle with fatigue and SOB. Denies chest pain. Advair helps breathing. Complains of loss of appetite and he has lost 4 lbs.   Current Outpatient Prescriptions on File Prior to Visit  Medication Sig Dispense Refill  . acetaminophen (TYLENOL) 500 MG tablet Take 1 tablet (500 mg total) by mouth every 6 (six) hours as needed for mild pain, moderate pain or fever.  30 tablet  0  . albuterol (PROVENTIL HFA;VENTOLIN HFA) 108 (90 BASE) MCG/ACT inhaler Inhale 1 puff into the lungs every 6 (six) hours as needed for wheezing or shortness of breath.      [name] aspirin EC 81 MG tablet Take 1 tablet (81 mg total) by mouth daily.  150 tablet  2  . atorvastatin (LIPITOR) 10  MG tablet Take 1 tablet (10 mg total) by mouth daily.  90 tablet  3  . carvedilol (COREG) 3.125 MG tablet Take 1 tablet (3.125 mg total) by mouth 2 (two) times daily with a meal.      . clopidogrel (PLAVIX) 75 MG tablet Take 1 tablet (75 mg total) by mouth daily.  90 tablet  3  . Fluticasone-Salmeterol (ADVAIR DISKUS) 250-50 MCG/DOSE AEPB Inhale 1 puff into the lungs 2 (two) times daily.  1 each  3  . hydrochlorothiazide (HYDRODIURIL) 25 MG tablet Take 1 tablet (25 mg total) by mouth daily.  90 tablet  3  . HYDROcodone-acetaminophen (NORCO/VICODIN) 5-325 MG per tablet Take 1 tablet by mouth at bedtime as needed for severe pain.  30 tablet  0  . lisinopril (PRINIVIL,ZESTRIL) 2.5 MG tablet Take 1 tablet (2.5 mg total) by mouth daily.      . nitroGLYCERIN (NITROSTAT) 0.4 MG SL tablet Place 1 tablet (0.4 mg total) under the tongue every 5 (five) minutes as needed for chest pain.  30 tablet  0  . potassium chloride SA (K-DUR,KLOR-CON) 20 MEQ tablet Take 2 tablets twice daily.  120 tablet  2  . tiotropium (SPIRIVA HANDIHALER) 18 MCG inhalation capsule Place 1 capsule (18 mcg total) into inhaler and inhale daily.  30 capsule  11   No current facility-administered medications on file prior to visit.    Allergies  Allergen Reactions  . Aspirin  GI upset  . Influenza Vaccines     Pt declines  . Pneumococcal Vaccines     Pt declines    Past Medical History  Diagnosis Date  . Dyslipidemia     on statin  . LBBB (left bundle branch block)   . CAD (coronary artery disease)     s/p stent RCA DES 9/09; s/p Myoview 2013 with reduced EF to 40%; no ischemia  . HTN (hypertension)     Reports intolerance to hydralazine; no beta blockers 2/2 bradycardia; failed on ACE and ARB  . Noncompliance   . LV dysfunction     EF about 40 to 45% per echo in April 2013  . LVH (left ventricular hypertrophy)   . COPD (chronic obstructive pulmonary disease) dx 06/2013    PFTs 07/08/13: mod obst with resp to  bronchodilator, moderate decrease diffusion, airtrapping  . Chronic low back pain     Past Surgical History  Procedure Laterality Date  . Cardiac catheterization      ejection fraction 50%    History  Smoking status  . Former Smoker  Smokeless tobacco  . Not on file    Comment: born in [place], raised in [place], works as Engineer, materialssecurity officer    History  Alcohol Use No    History reviewed. No pertinent family history.  Review of Systems: As noted in history of present illness  All other systems were reviewed and are negative.  Physical Exam: BP 152/80  Pulse 65  Ht 5\' 8"  (1.727 m)  Wt 144 lb (65.318 kg)  BMI 21.90 kg/m2  SpO2 95% Patient is pleasant and in no acute distress. Skin is warm and dry. Color is normal.  HEENT is unremarkable. Normocephalic/atraumatic. PERRL. Sclera are nonicteric. Neck is supple. No masses. No JVD. Lungs reveal coarse bilateral rhonchi with scattered wheexes.  Cardiac exam shows a regular rate and rhythm. Abdomen is soft. Extremities are without edema. Gait and ROM are intact. No gross neurologic deficits noted.   LABORATORY DATA: ECG 09/06/13 demonstrates sinus rhythm with a left bundle branch block.  Lab Results  Component Value Date   WBC 3.8* 09/03/2013   HGB 11.2* 09/03/2013   HCT 35.0* 09/03/2013   PLT 161 09/03/2013   GLUCOSE 138* 09/03/2013   CHOL 208* 06/18/2013   TRIG 97.0 06/18/2013   HDL 54.20 06/18/2013   LDLDIRECT 132.9 06/18/2013   LDLCALC 68 11/12/2011   ALT 11 09/02/2013   AST 19 09/02/2013   NA 137 09/03/2013   K 4.6 09/05/2013   CL 100 09/03/2013   CREATININE 0.90 09/03/2013   BUN 11 09/03/2013   CO2 26 09/03/2013   TSH 3.01 06/18/2013   INR 1.01 07/19/2010     Assessment / Plan: 1. Hypertension.  We'll continue with his current medical therapy. Recommend sodium restriction. Patient is intolerant to multiple blood pressure medications. He is on a low dose of Coreg without significant bradycardia. Continue ACEi and HCTZ. If  SOB persists may need to switch metoprolol to bisoprolol and lisinopril to an ARB  2. Coronary disease with remote stenting of the right coronary in 2009 with a drug-eluting stent. Chronic occlusion of OM. He is asymptomatic. Myoview study 2013 showed no ischemia. [facility] Dec[facility] With ? NSTEMI. Will request records and cath films from his hospitalization there. I will follow up in 2 months.  3. Tobacco abuse. Patient counseled on smoking cessation.  4. Hyperlipidemia  on Lipitor.   5. COPD exacerbated by recent  inluenza A infection. Continue Advair and prn albuterol.    Addendum: I reviewed cardiac cath films from [facility] on 08/16/13. This revealed a normal left main. The LAD had a segmental 50% stenosis in the mid vessel after the first diagonal. The first diagonal was occluded. There is a large Ramus intermediate vessel with 30% disease. The first OM was occluded. The RCA had 20-30% disease in the proximal and distal vessel with a patent stent in the mid vessel. There was anterior wall hypokinesis with EF about 50%. Attempt was made to do PCI of the OM1 but they were unable to cross with a wire.  Peter Swaziland MD, [facility]

## 2013-11-12 ENCOUNTER — Ambulatory Visit (INDEPENDENT_AMBULATORY_CARE_PROVIDER_SITE_OTHER): Payer: Medicare Other | Admitting: Pulmonary Disease

## 2013-11-12 ENCOUNTER — Encounter: Payer: Self-pay | Admitting: Pulmonary Disease

## 2013-11-12 VITALS — BP 140/78 | HR 55 | Ht 68.0 in | Wt 146.0 lb

## 2013-11-12 DIAGNOSIS — I2510 Atherosclerotic heart disease of native coronary artery without angina pectoris: Secondary | ICD-10-CM

## 2013-11-12 DIAGNOSIS — F17200 Nicotine dependence, unspecified, uncomplicated: Secondary | ICD-10-CM

## 2013-11-12 DIAGNOSIS — Z23 Encounter for immunization: Secondary | ICD-10-CM

## 2013-11-12 DIAGNOSIS — J4489 Other specified chronic obstructive pulmonary disease: Secondary | ICD-10-CM

## 2013-11-12 DIAGNOSIS — J449 Chronic obstructive pulmonary disease, unspecified: Secondary | ICD-10-CM

## 2013-11-12 DIAGNOSIS — Z720 Tobacco use: Secondary | ICD-10-CM

## 2013-11-12 NOTE — Assessment & Plan Note (Addendum)
He has moderate airflow obstruction significant response to a bronchodilator and air trapping. His symptoms would be better controlled if he used his spiriva on a regular basis and if he would finally quit smoking.  His Chest X-ray today demonstrated  Emphysema but nothing else.  Today he ambulated 500 feet in the office and did not desaturate in terms of O2 saturation.  Plan: -O2 therapy: not indicated -Immunizations: prevnar given today, flu UTD -Tobacco use: educated at length to quit, see below -Exercise: encouraged regular activity -Bronchodilator therapy: encouraged to use Spiriva daily rather than prn, continue Advair bid -Exacerbation prevention: Spiriva, quit smoking

## 2013-11-12 NOTE — Progress Notes (Signed)
Subjective:    Patient ID: [name], male    DOB: [birth year], 73 y.o.   MRN: [id number]  HPI  [name] is here to see me today for COPD.  He has been having shortness of breath for some time with exertion and he feel that the inhalers are not working too well. He continues to take Advair but he feels that it isn't working well.  He notices dyspnea after walking 15-20 mintues at work.  He typically has to use albuterol at this point which helps.  He works as a Electrical engineersecurity guard.  He thinks that this has increased in severity since then.    He has never had lung function test. His childhood was normal without respiratory illnesses.  He has never been admitted primarily for a respiratory problem, though the chart notes that he was admitted for influenza in 08/2013.    He has previously smoked as much as a pack per day.  He states that he decreased his smoking down to 1/3 pack per day before his hospitalization in 08/2013.  Stress at work made him smoke more.  He currently smokes just a few cigarettes per day.  However he has been trying hard to quit lately.  He quit at one point in the 1960's using nicorette gum.  Unfortunately he started smoking again in 1976.    He has coronary artery disease, CHF, and history of NSVT.    He does not cough much.    Past Medical History  Diagnosis Date  . Dyslipidemia     on statin  . LBBB (left bundle branch block)   . CAD (coronary artery disease)     s/p stent RCA DES 9/09; s/p Myoview 2013 with reduced EF to 40%; no ischemia  . HTN (hypertension)     Reports intolerance to hydralazine; no beta blockers 2/2 bradycardia; failed on ACE and ARB  . Noncompliance   . LV dysfunction     EF about 40 to 45% per echo in April 2013  . LVH (left ventricular hypertrophy)   . COPD (chronic obstructive pulmonary disease) dx 06/2013    PFTs 07/08/13: mod obst with resp to bronchodilator, moderate decrease diffusion, airtrapping  . Chronic low back pain       No family history on file.   History   Social History  . Marital Status: Married    Spouse Name: N/A    Number of Children: 3  . Years of Education: N/A   Occupational History  . security guard.    Social History Main Topics  . Smoking status: Former Smoker -- 0.50 packs/day for 56 years    Types: Cigarettes    Quit date: 09/10/2012  . Smokeless tobacco: Never Used  . Alcohol Use: No  . Drug Use: No  . Sexual Activity: Not on file   Other Topics Concern  . Not on file   Social History Narrative  . No narrative on file     Allergies  Allergen Reactions  . Aspirin     GI upset  . Influenza Vaccines     Pt declines  . Pneumococcal Vaccines     Pt declines     Outpatient Prescriptions Prior to Visit  Medication Sig Dispense Refill  . acetaminophen (TYLENOL) 500 MG tablet Take 1 tablet (500 mg total) by mouth every 6 (six) hours as needed for mild pain, moderate pain or fever.  30 tablet  0  . albuterol (PROVENTIL HFA;VENTOLIN HFA)  108 (90 BASE) MCG/ACT inhaler Inhale 1 puff into the lungs every 6 (six) hours as needed for wheezing or shortness of breath.      [name] aspirin EC 81 MG tablet Take 1 tablet (81 mg total) by mouth daily.  150 tablet  2  . atorvastatin (LIPITOR) 10 MG tablet Take 1 tablet (10 mg total) by mouth daily.  90 tablet  3  . carvedilol (COREG) 3.125 MG tablet Take 1 tablet (3.125 mg total) by mouth 2 (two) times daily with a meal.      . clopidogrel (PLAVIX) 75 MG tablet Take 1 tablet (75 mg total) by mouth daily.  90 tablet  3  . Fluticasone-Salmeterol (ADVAIR DISKUS) 250-50 MCG/DOSE AEPB Inhale 1 puff into the lungs 2 (two) times daily.  1 each  3  . hydrochlorothiazide (HYDRODIURIL) 25 MG tablet Take 1 tablet (25 mg total) by mouth daily.  90 tablet  3  . HYDROcodone-acetaminophen (NORCO/VICODIN) 5-325 MG per tablet Take 1 tablet by mouth at bedtime as needed for severe pain.  30 tablet  0  . lisinopril (PRINIVIL,ZESTRIL) 2.5 MG tablet Take 1  tablet (2.5 mg total) by mouth daily.      . nitroGLYCERIN (NITROSTAT) 0.4 MG SL tablet Place 1 tablet (0.4 mg total) under the tongue every 5 (five) minutes as needed for chest pain.  30 tablet  0  . potassium chloride SA (K-DUR,KLOR-CON) 20 MEQ tablet Take 2 tablets twice daily.  120 tablet  2  . tiotropium (SPIRIVA HANDIHALER) 18 MCG inhalation capsule Place 1 capsule (18 mcg total) into inhaler and inhale daily.  30 capsule  11   No facility-administered medications prior to visit.      Review of Systems  Constitutional: Positive for fatigue. Negative for fever and unexpected weight change.  HENT: Negative for congestion, dental problem, ear pain, nosebleeds, postnasal drip, rhinorrhea, sinus pressure, sneezing, sore throat and trouble swallowing.   Eyes: Negative for redness and itching.  Respiratory: Positive for shortness of breath. Negative for cough, chest tightness and wheezing.   Cardiovascular: Negative for palpitations and leg swelling.  Gastrointestinal: Negative for nausea and vomiting.  Genitourinary: Negative for dysuria.  Musculoskeletal: Negative for joint swelling.  Skin: Negative for rash.  Neurological: Negative for headaches.  Hematological: Does not bruise/bleed easily.  Psychiatric/Behavioral: Negative for dysphoric mood. The patient is not nervous/anxious.        Objective:   Physical Exam Filed Vitals:   11/12/13 0911  BP: 140/78  Pulse: 55  Height: 5\' 8"  (1.727 m)  Weight: 146 lb (66.225 kg)  SpO2: 98%   RA  Gen: well appearing, no acute distress HEENT: NCAT, PERRL, EOMi, OP clear, neck supple without masses PULM: Poor airmovement bilatearlly, rare wheeze noted in bases CV: RRR, no mgr, no JVD AB: BS+, soft, nontender, no hsm Ext: warm, no edema, no clubbing, no cyanosis Derm: no rash or skin breakdown Neuro: A&Ox4, CN II-XII intact, strength 5/5 in all 4 extremities        Assessment & Plan:   COPD with asthma He has moderate airflow  obstruction significant response to a bronchodilator and air trapping. His symptoms would be better controlled if he used his spiriva on a regular basis and if he would finally quit smoking.  His Chest X-ray today demonstrated  Emphysema but nothing else.  Today he ambulated 500 feet in the office and did not desaturate in terms of O2 saturation.  Plan: -O2 therapy: not indicated -Immunizations: prevnar given  today, flu UTD -Tobacco use: educated at length to quit, see below -Exercise: encouraged regular activity -Bronchodilator therapy: encouraged to use Spiriva daily rather than prn, continue Advair bid -Exacerbation prevention: Spiriva, quit smoking   Tobacco abuse He is motivated to quit and is willing to use nicotine replacement therapy. We discussed this at length and I encouraged him to quit.  Plan: -call [place] quitline for free nicotine replacement     Updated Medication List Outpatient Encounter Prescriptions as of 11/12/2013  Medication Sig  . acetaminophen (TYLENOL) 500 MG tablet Take 1 tablet (500 mg total) by mouth every 6 (six) hours as needed for mild pain, moderate pain or fever.  [name] albuterol (PROVENTIL HFA;VENTOLIN HFA) 108 (90 BASE) MCG/ACT inhaler Inhale 1 puff into the lungs every 6 (six) hours as needed for wheezing or shortness of breath.  [name] aspirin EC 81 MG tablet Take 1 tablet (81 mg total) by mouth daily.  [name] atorvastatin (LIPITOR) 10 MG tablet Take 1 tablet (10 mg total) by mouth daily.  . carvedilol (COREG) 3.125 MG tablet Take 1 tablet (3.125 mg total) by mouth 2 (two) times daily with a meal.  . clopidogrel (PLAVIX) 75 MG tablet Take 1 tablet (75 mg total) by mouth daily.  . Fluticasone-Salmeterol (ADVAIR DISKUS) 250-50 MCG/DOSE AEPB Inhale 1 puff into the lungs 2 (two) times daily.  . hydrochlorothiazide (HYDRODIURIL) 25 MG tablet Take 1 tablet (25 mg total) by mouth daily.  [name] HYDROcodone-acetaminophen (NORCO/VICODIN) 5-325 MG per tablet Take 1 tablet by mouth  at bedtime as needed for severe pain.  [name] lisinopril (PRINIVIL,ZESTRIL) 2.5 MG tablet Take 1 tablet (2.5 mg total) by mouth daily.  . nitroGLYCERIN (NITROSTAT) 0.4 MG SL tablet Place 1 tablet (0.4 mg total) under the tongue every 5 (five) minutes as needed for chest pain.  . potassium chloride SA (K-DUR,KLOR-CON) 20 MEQ tablet Take 2 tablets twice daily.  [name] tiotropium (SPIRIVA HANDIHALER) 18 MCG inhalation capsule Place 1 capsule (18 mcg total) into inhaler and inhale daily.

## 2013-11-12 NOTE — Patient Instructions (Signed)
You have severe COPD Use your Spiriva daily in addition to the Advair no matter how you feel, not as needed Use albuterol ten minutes before exercising Think about lung cancer screening. We will see you back in 2-3 months or sooner if needed

## 2013-11-13 NOTE — Assessment & Plan Note (Signed)
He is motivated to quit and is willing to use nicotine replacement therapy. We discussed this at length and I encouraged him to quit.  Plan: -call [place] quitline for free nicotine replacement

## 2013-12-24 ENCOUNTER — Ambulatory Visit (INDEPENDENT_AMBULATORY_CARE_PROVIDER_SITE_OTHER): Payer: Medicare Other | Admitting: Cardiology

## 2013-12-24 ENCOUNTER — Encounter: Payer: Self-pay | Admitting: Cardiology

## 2013-12-24 ENCOUNTER — Other Ambulatory Visit: Payer: Self-pay | Admitting: Cardiology

## 2013-12-24 VITALS — BP 160/74 | HR 60 | Ht 68.0 in | Wt 148.0 lb

## 2013-12-24 DIAGNOSIS — I10 Essential (primary) hypertension: Secondary | ICD-10-CM

## 2013-12-24 DIAGNOSIS — I2510 Atherosclerotic heart disease of native coronary artery without angina pectoris: Secondary | ICD-10-CM

## 2013-12-24 DIAGNOSIS — J449 Chronic obstructive pulmonary disease, unspecified: Secondary | ICD-10-CM

## 2013-12-24 DIAGNOSIS — I447 Left bundle-branch block, unspecified: Secondary | ICD-10-CM

## 2013-12-24 MED ORDER — ALBUTEROL SULFATE HFA 108 (90 BASE) MCG/ACT IN AERS: 1.0000 | INHALATION_SPRAY | Freq: Four times a day (QID) | RESPIRATORY_TRACT | Status: DC | PRN

## 2013-12-24 MED ORDER — LOSARTAN POTASSIUM 100 MG PO TABS: 100.0000 mg | ORAL_TABLET | Freq: Every day | ORAL | Status: DC

## 2013-12-24 NOTE — Patient Instructions (Signed)
Stop taking lisinopril and carvedilol.  Start taking losartan for your blood pressure.   Continue your other medication  We will get you an appointment with pulmonary

## 2013-12-24 NOTE — Progress Notes (Signed)
[name] Date of Birth: [birth year] Medical Record #[id number]  History of Present Illness: [name] is seen back today for a followup visit. He has multiple medical issues which include HTN, noncompliance, HLD and known CAD. He has a history of tobacco abuse. He has LV dysfunction with ejection fraction of 40-45% Not able to take higher beta blocker dose due to his resting bradycardia. His last echocardiogram in April 2013 showed an ejection fraction of 40-45% with diffuse hypokinesis. There was dyssynchrony related to his left branch block. He had mild mitral insufficiency. He had a Myoview study in March of 2013 which showed normal perfusion. Ejection fraction of 40%. He was admitted to [facility] in December with chest pain. Cardiac cath showed patent RCA stent. There was a 50% mid LAD lesion. The first diagonal and first OM were occluded. Attempted PCI of the OM but unable to cross with wire. Review of old films here showed that was a chronic obstruction. He reports quitting smoking in February. Still having a lot of problems with his breathing. Constantly SOB-worse with activity. Some improvement with albuterol. Wheezing.  Current Outpatient Prescriptions on File Prior to Visit  Medication Sig Dispense Refill  . acetaminophen (TYLENOL) 500 MG tablet Take 1 tablet (500 mg total) by mouth every 6 (six) hours as needed for mild pain, moderate pain or fever.  30 tablet  0  . aspirin EC 81 MG tablet Take 1 tablet (81 mg total) by mouth daily.  150 tablet  2  . atorvastatin (LIPITOR) 10 MG tablet Take 1 tablet (10 mg total) by mouth daily.  90 tablet  3  . clopidogrel (PLAVIX) 75 MG tablet Take 1 tablet (75 mg total) by mouth daily.  90 tablet  3  . Fluticasone-Salmeterol (ADVAIR DISKUS) 250-50 MCG/DOSE AEPB Inhale 1 puff into the lungs 2 (two) times daily.  1 each  3  . hydrochlorothiazide (HYDRODIURIL) 25 MG tablet Take 1 tablet (25 mg total) by mouth daily.  90 tablet  3  .  HYDROcodone-acetaminophen (NORCO/VICODIN) 5-325 MG per tablet Take 1 tablet by mouth at bedtime as needed for severe pain.  30 tablet  0  . nitroGLYCERIN (NITROSTAT) 0.4 MG SL tablet Place 1 tablet (0.4 mg total) under the tongue every 5 (five) minutes as needed for chest pain.  30 tablet  0  . potassium chloride SA (K-DUR,KLOR-CON) 20 MEQ tablet Take 2 tablets twice daily.  120 tablet  2  . tiotropium (SPIRIVA HANDIHALER) 18 MCG inhalation capsule Place 1 capsule (18 mcg total) into inhaler and inhale daily.  30 capsule  11   No current facility-administered medications on file prior to visit.    Allergies  Allergen Reactions  . Aspirin     GI upset  . Influenza Vaccines     Pt declines  . Pneumococcal Vaccines     Pt declines    Past Medical History  Diagnosis Date  . Dyslipidemia     on statin  . LBBB (left bundle branch block)   . CAD (coronary artery disease)     s/p stent RCA DES 9/09; s/p Myoview 2013 with reduced EF to 40%; no ischemia  . HTN (hypertension)     Reports intolerance to hydralazine; no beta blockers 2/2 bradycardia; failed on ACE and ARB  . Noncompliance   . LV dysfunction     EF about 40 to 45% per echo in April 2013  . LVH (left ventricular hypertrophy)   . COPD (chronic  obstructive pulmonary disease) dx 06/2013    PFTs 07/08/13: mod obst with resp to bronchodilator, moderate decrease diffusion, airtrapping  . Chronic low back pain     Past Surgical History  Procedure Laterality Date  . Cardiac catheterization      ejection fraction 50%    History  Smoking status  . Former Smoker -- 0.50 packs/day for 56 years  . Types: Cigarettes  . Quit date: 09/10/2012  Smokeless tobacco  . Never Used    History  Alcohol Use No    History reviewed. No pertinent family history.  Review of Systems: As noted in history of present illness  All other systems were reviewed and are negative.  Physical Exam: BP 160/74  Pulse 60  Ht 5\' 8"  (1.727 m)  Wt  148 lb (67.132 kg)  BMI 22.51 kg/m2 Patient is pleasant and in no acute distress. Skin is warm and dry. Color is normal.  HEENT is unremarkable. Normocephalic/atraumatic. PERRL. Sclera are nonicteric. Neck is supple. No masses. No JVD. Lungs reveal coarse bilateral rhonchi and wheezes.  Cardiac exam shows a regular rate and rhythm. Abdomen is soft. Extremities are without edema. Gait and ROM are intact. No gross neurologic deficits noted.   LABORATORY DATA:   Lab Results  Component Value Date   WBC 3.8* 09/03/2013   HGB 11.2* 09/03/2013   HCT 35.0* 09/03/2013   PLT 161 09/03/2013   GLUCOSE 138* 09/03/2013   CHOL 208* 06/18/2013   TRIG 97.0 06/18/2013   HDL 54.20 06/18/2013   LDLDIRECT 132.9 06/18/2013   LDLCALC 68 11/12/2011   ALT 11 09/02/2013   AST 19 09/02/2013   NA 137 09/03/2013   K 4.6 09/05/2013   CL 100 09/03/2013   CREATININE 0.90 09/03/2013   BUN 11 09/03/2013   CO2 26 09/03/2013   TSH 3.01 06/18/2013   INR 1.01 07/19/2010     Assessment / Plan: 1. Hypertension.  Under fair control. Given active bronchospasm will stop carvediolol and switch lisinopril to losartan 100 mg daily. We'll continue with his other medical therapy. Recommend sodium restriction.  2. Coronary disease with remote stenting of the right coronary in 2009 with a drug-eluting stent. Chronic occlusion of OM. He is asymptomatic. Myoview study 2013 showed no ischemia. [facility] With failed attempt at PCI of OM.  3. Tobacco abuse.Encouraged smoking cessation.  4. Hyperlipidemia  on Lipitor.   5. COPD poorly controlled. Will refer to pulmonary.

## 2014-01-14 ENCOUNTER — Ambulatory Visit: Payer: Medicare Other | Admitting: Internal Medicine

## 2014-01-14 DIAGNOSIS — Z0289 Encounter for other administrative examinations: Secondary | ICD-10-CM

## 2014-01-29 ENCOUNTER — Institutional Professional Consult (permissible substitution): Payer: Medicare Other | Admitting: Internal Medicine

## 2014-02-05 ENCOUNTER — Ambulatory Visit: Payer: Medicare Other | Admitting: Pulmonary Disease

## 2014-03-25 ENCOUNTER — Other Ambulatory Visit: Payer: Self-pay | Admitting: Internal Medicine

## 2014-05-08 ENCOUNTER — Encounter (HOSPITAL_COMMUNITY): Payer: Self-pay | Admitting: Emergency Medicine

## 2014-05-08 ENCOUNTER — Encounter (HOSPITAL_COMMUNITY)
Admission: EM | Disposition: A | Discharge status: Home Health | Payer: Self-pay | Source: Home / Self Care | Attending: Cardiovascular Disease

## 2014-05-08 ENCOUNTER — Inpatient Hospital Stay (HOSPITAL_COMMUNITY)
Admission: EM | Admit: 2014-05-08 | Discharge: 2014-05-18 | DRG: 286 | Disposition: A | Discharge status: Home Health | Payer: Medicare Other | Attending: Cardiovascular Disease | Admitting: Cardiovascular Disease

## 2014-05-08 ENCOUNTER — Emergency Department (HOSPITAL_COMMUNITY): Payer: Medicare Other

## 2014-05-08 ENCOUNTER — Inpatient Hospital Stay (HOSPITAL_COMMUNITY): Payer: Medicare Other

## 2014-05-08 DIAGNOSIS — I279 Pulmonary heart disease, unspecified: Secondary | ICD-10-CM

## 2014-05-08 DIAGNOSIS — Z7902 Long term (current) use of antithrombotics/antiplatelets: Secondary | ICD-10-CM | POA: Diagnosis not present

## 2014-05-08 DIAGNOSIS — I493 Ventricular premature depolarization: Secondary | ICD-10-CM

## 2014-05-08 DIAGNOSIS — I059 Rheumatic mitral valve disease, unspecified: Secondary | ICD-10-CM | POA: Diagnosis present

## 2014-05-08 DIAGNOSIS — I509 Heart failure, unspecified: Secondary | ICD-10-CM | POA: Diagnosis present

## 2014-05-08 DIAGNOSIS — I959 Hypotension, unspecified: Secondary | ICD-10-CM | POA: Diagnosis not present

## 2014-05-08 DIAGNOSIS — F17200 Nicotine dependence, unspecified, uncomplicated: Secondary | ICD-10-CM | POA: Diagnosis present

## 2014-05-08 DIAGNOSIS — Z7982 Long term (current) use of aspirin: Secondary | ICD-10-CM | POA: Diagnosis not present

## 2014-05-08 DIAGNOSIS — I498 Other specified cardiac arrhythmias: Secondary | ICD-10-CM | POA: Diagnosis present

## 2014-05-08 DIAGNOSIS — D649 Anemia, unspecified: Secondary | ICD-10-CM | POA: Diagnosis present

## 2014-05-08 DIAGNOSIS — I5041 Acute combined systolic (congestive) and diastolic (congestive) heart failure: Secondary | ICD-10-CM

## 2014-05-08 DIAGNOSIS — Z791 Long term (current) use of non-steroidal anti-inflammatories (NSAID): Secondary | ICD-10-CM | POA: Diagnosis not present

## 2014-05-08 DIAGNOSIS — I5043 Acute on chronic combined systolic (congestive) and diastolic (congestive) heart failure: Principal | ICD-10-CM | POA: Diagnosis present

## 2014-05-08 DIAGNOSIS — Z9861 Coronary angioplasty status: Secondary | ICD-10-CM | POA: Diagnosis not present

## 2014-05-08 DIAGNOSIS — J449 Chronic obstructive pulmonary disease, unspecified: Secondary | ICD-10-CM | POA: Diagnosis present

## 2014-05-08 DIAGNOSIS — R40244 Other coma, without documented Glasgow coma scale score, or with partial score reported, unspecified time: Secondary | ICD-10-CM

## 2014-05-08 DIAGNOSIS — I447 Left bundle-branch block, unspecified: Secondary | ICD-10-CM | POA: Diagnosis present

## 2014-05-08 DIAGNOSIS — Z91199 Patient's noncompliance with other medical treatment and regimen due to unspecified reason: Secondary | ICD-10-CM | POA: Diagnosis not present

## 2014-05-08 DIAGNOSIS — M545 Low back pain, unspecified: Secondary | ICD-10-CM | POA: Diagnosis present

## 2014-05-08 DIAGNOSIS — D696 Thrombocytopenia, unspecified: Secondary | ICD-10-CM | POA: Diagnosis present

## 2014-05-08 DIAGNOSIS — I2582 Chronic total occlusion of coronary artery: Secondary | ICD-10-CM | POA: Diagnosis present

## 2014-05-08 DIAGNOSIS — R509 Fever, unspecified: Secondary | ICD-10-CM | POA: Diagnosis present

## 2014-05-08 DIAGNOSIS — E785 Hyperlipidemia, unspecified: Secondary | ICD-10-CM | POA: Diagnosis present

## 2014-05-08 DIAGNOSIS — I674 Hypertensive encephalopathy: Secondary | ICD-10-CM | POA: Diagnosis present

## 2014-05-08 DIAGNOSIS — J9601 Acute respiratory failure with hypoxia: Secondary | ICD-10-CM

## 2014-05-08 DIAGNOSIS — Z9119 Patient's noncompliance with other medical treatment and regimen: Secondary | ICD-10-CM | POA: Diagnosis not present

## 2014-05-08 DIAGNOSIS — IMO0002 Reserved for concepts with insufficient information to code with codable children: Secondary | ICD-10-CM | POA: Diagnosis present

## 2014-05-08 DIAGNOSIS — I472 Ventricular tachycardia, unspecified: Secondary | ICD-10-CM | POA: Diagnosis present

## 2014-05-08 DIAGNOSIS — J9600 Acute respiratory failure, unspecified whether with hypoxia or hypercapnia: Secondary | ICD-10-CM | POA: Diagnosis present

## 2014-05-08 DIAGNOSIS — R7309 Other abnormal glucose: Secondary | ICD-10-CM | POA: Diagnosis present

## 2014-05-08 DIAGNOSIS — J811 Chronic pulmonary edema: Secondary | ICD-10-CM

## 2014-05-08 DIAGNOSIS — J4489 Other specified chronic obstructive pulmonary disease: Secondary | ICD-10-CM | POA: Diagnosis present

## 2014-05-08 DIAGNOSIS — D691 Qualitative platelet defects: Secondary | ICD-10-CM | POA: Diagnosis present

## 2014-05-08 DIAGNOSIS — I10 Essential (primary) hypertension: Secondary | ICD-10-CM | POA: Diagnosis present

## 2014-05-08 DIAGNOSIS — I5023 Acute on chronic systolic (congestive) heart failure: Secondary | ICD-10-CM | POA: Diagnosis present

## 2014-05-08 DIAGNOSIS — J810 Acute pulmonary edema: Secondary | ICD-10-CM | POA: Diagnosis present

## 2014-05-08 DIAGNOSIS — G8929 Other chronic pain: Secondary | ICD-10-CM | POA: Diagnosis present

## 2014-05-08 DIAGNOSIS — R092 Respiratory arrest: Secondary | ICD-10-CM

## 2014-05-08 DIAGNOSIS — I255 Ischemic cardiomyopathy: Secondary | ICD-10-CM

## 2014-05-08 DIAGNOSIS — E876 Hypokalemia: Secondary | ICD-10-CM | POA: Diagnosis present

## 2014-05-08 DIAGNOSIS — R079 Chest pain, unspecified: Secondary | ICD-10-CM | POA: Diagnosis present

## 2014-05-08 DIAGNOSIS — I4729 Other ventricular tachycardia: Secondary | ICD-10-CM | POA: Diagnosis present

## 2014-05-08 DIAGNOSIS — I209 Angina pectoris, unspecified: Secondary | ICD-10-CM

## 2014-05-08 DIAGNOSIS — R4020 Unspecified coma: Secondary | ICD-10-CM

## 2014-05-08 DIAGNOSIS — I319 Disease of pericardium, unspecified: Secondary | ICD-10-CM

## 2014-05-08 DIAGNOSIS — R4182 Altered mental status, unspecified: Secondary | ICD-10-CM

## 2014-05-08 DIAGNOSIS — I2510 Atherosclerotic heart disease of native coronary artery without angina pectoris: Secondary | ICD-10-CM | POA: Diagnosis present

## 2014-05-08 HISTORY — DX: Essential (primary) hypertension: I10

## 2014-05-08 HISTORY — PX: LEFT HEART CATHETERIZATION WITH CORONARY ANGIOGRAM: SHX5451

## 2014-05-08 LAB — DIFFERENTIAL
BASOS ABS: 0 10*3/uL (ref 0.0–0.1)
Basophils Relative: 0 % (ref 0–1)
EOS ABS: 0.1 10*3/uL (ref 0.0–0.7)
EOS PCT: 1 % (ref 0–5)
LYMPHS ABS: 4.8 10*3/uL — AB (ref 0.7–4.0)
Lymphocytes Relative: 60 % — ABNORMAL HIGH (ref 12–46)
MONO ABS: 0.5 10*3/uL (ref 0.1–1.0)
Monocytes Relative: 6 % (ref 3–12)
Neutro Abs: 2.6 10*3/uL (ref 1.7–7.7)
Neutrophils Relative %: 33 % — ABNORMAL LOW (ref 43–77)

## 2014-05-08 LAB — CBC
HCT: 45.9 % (ref 39.0–52.0)
Hemoglobin: 14.7 g/dL (ref 13.0–17.0)
MCH: 28.9 pg (ref 26.0–34.0)
MCHC: 32 g/dL (ref 30.0–36.0)
MCV: 90.4 fL (ref 78.0–100.0)
Platelets: 112 10*3/uL — ABNORMAL LOW (ref 150–400)
RBC: 5.08 MIL/uL (ref 4.22–5.81)
RDW: 14.2 % (ref 11.5–15.5)
WBC: 8 10*3/uL (ref 4.0–10.5)

## 2014-05-08 LAB — I-STAT TROPONIN, ED: Troponin i, poc: 0.13 ng/mL (ref 0.00–0.08)

## 2014-05-08 LAB — I-STAT ARTERIAL BLOOD GAS, ED
Acid-base deficit: 10 mmol/L — ABNORMAL HIGH (ref 0.0–2.0)
Bicarbonate: 23.3 mEq/L (ref 20.0–24.0)
O2 Saturation: 85 %
PCO2 ART: 93.6 mmHg — AB (ref 35.0–45.0)
PH ART: 7.003 — AB (ref 7.350–7.450)
TCO2: 26 mmol/L (ref 0–100)
pO2, Arterial: 77 mmHg — ABNORMAL LOW (ref 80.0–100.0)

## 2014-05-08 LAB — POCT I-STAT 3, ART BLOOD GAS (G3+)
ACID-BASE EXCESS: 1 mmol/L (ref 0.0–2.0)
Acid-base deficit: 2 mmol/L (ref 0.0–2.0)
BICARBONATE: 27 meq/L — AB (ref 20.0–24.0)
BICARBONATE: 25.4 meq/L — AB (ref 20.0–24.0)
Bicarbonate: 26.4 meq/L — ABNORMAL HIGH (ref 20.0–24.0)
Bicarbonate: 25.5 mEq/L — ABNORMAL HIGH (ref 20.0–24.0)
O2 SAT: 92 %
O2 Saturation: 81 %
O2 Saturation: 99 %
O2 Saturation: 98 %
PCO2 ART: 42.5 mmHg (ref 35.0–45.0)
PH ART: 7.229 — AB (ref 7.350–7.450)
PH ART: 7.401 (ref 7.350–7.450)
PO2 ART: 162 mmHg — AB (ref 80.0–100.0)
Patient temperature: 35.8
Patient temperature: 36.4
Patient temperature: 38.9
TCO2: 29 mmol/L (ref 0–100)
TCO2: 28 mmol/L (ref 0–100)
TCO2: 27 mmol/L (ref 0–100)
TCO2: 27 mmol/L (ref 0–100)
pCO2 arterial: 64.6 mmHg (ref 35.0–45.0)
pCO2 arterial: 45.3 mmHg — ABNORMAL HIGH (ref 35.0–45.0)
pCO2 arterial: 40.7 mmHg (ref 35.0–45.0)
pH, Arterial: 7.368 (ref 7.350–7.450)
pH, Arterial: 7.394 (ref 7.350–7.450)
pO2, Arterial: 77 mmHg — ABNORMAL LOW (ref 80.0–100.0)
pO2, Arterial: 44 mmHg — ABNORMAL LOW (ref 80.0–100.0)
pO2, Arterial: 114 mmHg — ABNORMAL HIGH (ref 80.0–100.0)

## 2014-05-08 LAB — URINALYSIS, ROUTINE W REFLEX MICROSCOPIC
BILIRUBIN URINE: NEGATIVE
Bilirubin Urine: NEGATIVE
GLUCOSE, UA: 100 mg/dL — AB
Glucose, UA: NEGATIVE mg/dL
Ketones, ur: NEGATIVE mg/dL
Ketones, ur: NEGATIVE mg/dL
Leukocytes, UA: NEGATIVE
Nitrite: NEGATIVE
Nitrite: NEGATIVE
PROTEIN: 100 mg/dL — AB
Protein, ur: NEGATIVE mg/dL
SPECIFIC GRAVITY, URINE: 1.028 (ref 1.005–1.030)
Specific Gravity, Urine: 1.017 (ref 1.005–1.030)
Urobilinogen, UA: 1 mg/dL (ref 0.0–1.0)
Urobilinogen, UA: 0.2 mg/dL (ref 0.0–1.0)
pH: 6.5 (ref 5.0–8.0)
pH: 5 (ref 5.0–8.0)

## 2014-05-08 LAB — URINE MICROSCOPIC-ADD ON

## 2014-05-08 LAB — BLOOD GAS, ARTERIAL
Acid-Base Excess: 0.2 mmol/L (ref 0.0–2.0)
Bicarbonate: 24.2 mEq/L — ABNORMAL HIGH (ref 20.0–24.0)
DRAWN BY: 330991
FIO2: 0.9 %
O2 SAT: 99.2 %
PATIENT TEMPERATURE: 100.4
PEEP/CPAP: 15 cmH2O
PH ART: 7.404 (ref 7.350–7.450)
PO2 ART: 199 mmHg — AB (ref 80.0–100.0)
PRESSURE CONTROL: 20 cmH2O
RATE: 15 resp/min
TCO2: 25.4 mmol/L (ref 0–100)
pCO2 arterial: 40 mmHg (ref 35.0–45.0)

## 2014-05-08 LAB — RAPID URINE DRUG SCREEN, HOSP PERFORMED
Amphetamines: NOT DETECTED
BENZODIAZEPINES: NOT DETECTED
Barbiturates: NOT DETECTED
Cocaine: NOT DETECTED
OPIATES: NOT DETECTED
Tetrahydrocannabinol: NOT DETECTED

## 2014-05-08 LAB — PROTIME-INR
INR: 1.18 (ref 0.00–1.49)
PROTHROMBIN TIME: 15 s (ref 11.6–15.2)

## 2014-05-08 LAB — BASIC METABOLIC PANEL
ANION GAP: 15 (ref 5–15)
Anion gap: 20 — ABNORMAL HIGH (ref 5–15)
BUN: 10 mg/dL (ref 6–23)
BUN: 13 mg/dL (ref 6–23)
CO2: 23 meq/L (ref 19–32)
CO2: 23 meq/L (ref 19–32)
Calcium: 9.1 mg/dL (ref 8.4–10.5)
Calcium: 7.7 mg/dL — ABNORMAL LOW (ref 8.4–10.5)
Chloride: 104 mEq/L (ref 96–112)
Chloride: 105 mEq/L (ref 96–112)
Creatinine, Ser: 1.01 mg/dL (ref 0.50–1.35)
Creatinine, Ser: 0.95 mg/dL (ref 0.50–1.35)
GFR calc Af Amer: 85 mL/min — ABNORMAL LOW (ref >90)
GFR calc Af Amer: >90 mL/min (ref >90)
GFR calc non Af Amer: 82 mL/min — ABNORMAL LOW (ref >90)
GFR, EST NON AFRICAN AMERICAN: 73 mL/min — AB (ref >90)
GLUCOSE: 173 mg/dL — AB (ref 70–99)
GLUCOSE: 89 mg/dL (ref 70–99)
POTASSIUM: 3.5 meq/L — AB (ref 3.7–5.3)
POTASSIUM: 3.8 meq/L (ref 3.7–5.3)
SODIUM: 143 meq/L (ref 137–147)
Sodium: 147 mEq/L (ref 137–147)

## 2014-05-08 LAB — PROCALCITONIN: Procalcitonin: 2.41 ng/mL

## 2014-05-08 LAB — APTT: aPTT: 26 seconds (ref 24–37)

## 2014-05-08 LAB — GLUCOSE, CAPILLARY
GLUCOSE-CAPILLARY: 73 mg/dL (ref 70–99)
GLUCOSE-CAPILLARY: 131 mg/dL — AB (ref 70–99)
Glucose-Capillary: 199 mg/dL — ABNORMAL HIGH (ref 70–99)

## 2014-05-08 LAB — MRSA PCR SCREENING: MRSA BY PCR: NEGATIVE

## 2014-05-08 LAB — MAGNESIUM: MAGNESIUM: 1.5 mg/dL (ref 1.5–2.5)

## 2014-05-08 LAB — TRIGLYCERIDES: Triglycerides: 71 mg/dL (ref <150)

## 2014-05-08 LAB — TROPONIN I: Troponin I: <0.3 ng/mL (ref <0.30)

## 2014-05-08 SURGERY — LEFT HEART CATHETERIZATION WITH CORONARY ANGIOGRAM
Anesthesia: LOCAL

## 2014-05-08 MED ORDER — HEPARIN (PORCINE) IN NACL 2-0.9 UNIT/ML-% IJ SOLN
INTRAMUSCULAR | Status: AC
  Filled 2014-05-08: qty 1000

## 2014-05-08 MED ORDER — ACETAMINOPHEN 160 MG/5ML PO SOLN
650.0000 mg | ORAL | Status: DC | PRN
  Administered 2014-05-08 (×2): 650 mg via ORAL
  Filled 2014-05-08 (×2): qty 20.3

## 2014-05-08 MED ORDER — SODIUM CHLORIDE 0.9 % IJ SOLN: 3.0000 mL | INTRAMUSCULAR | Status: DC | PRN

## 2014-05-08 MED ORDER — AZITHROMYCIN 500 MG IV SOLR
500.0000 mg | INTRAVENOUS | Status: DC
  Administered 2014-05-08 – 2014-05-09 (×2): 500 mg via INTRAVENOUS
  Filled 2014-05-08 (×3): qty 500

## 2014-05-08 MED ORDER — FENTANYL CITRATE 0.05 MG/ML IJ SOLN
50.0000 ug | INTRAMUSCULAR | Status: DC | PRN
  Administered 2014-05-09 (×4): 50 ug via INTRAVENOUS
  Filled 2014-05-08 (×4): qty 2

## 2014-05-08 MED ORDER — MIDAZOLAM HCL 2 MG/2ML IJ SOLN
INTRAMUSCULAR | Status: AC
  Filled 2014-05-08: qty 2

## 2014-05-08 MED ORDER — ETOMIDATE 2 MG/ML IV SOLN
INTRAVENOUS | Status: AC | PRN
  Administered 2014-05-08: 20 mg via INTRAVENOUS

## 2014-05-08 MED ORDER — SODIUM CHLORIDE 0.9 % IV SOLN: 250.0000 mL | INTRAVENOUS | Status: DC | PRN

## 2014-05-08 MED ORDER — VITAL HIGH PROTEIN PO LIQD
1000.0000 mL | ORAL | Status: DC
  Filled 2014-05-08 (×2): qty 1000

## 2014-05-08 MED ORDER — CLOPIDOGREL BISULFATE 300 MG PO TABS: 300.0000 mg | ORAL_TABLET | Freq: Once | ORAL | Status: DC

## 2014-05-08 MED ORDER — ROCURONIUM BROMIDE 50 MG/5ML IV SOLN
INTRAVENOUS | Status: AC
  Filled 2014-05-08: qty 2

## 2014-05-08 MED ORDER — SODIUM BICARBONATE 8.4 % IV SOLN
50.0000 meq | Freq: Once | INTRAVENOUS | Status: AC
  Administered 2014-05-08: 50 meq via INTRAVENOUS

## 2014-05-08 MED ORDER — ATORVASTATIN CALCIUM 10 MG PO TABS
10.0000 mg | ORAL_TABLET | Freq: Every day | ORAL | Status: DC
  Administered 2014-05-08 – 2014-05-09 (×2): 10 mg via NASOGASTRIC
  Filled 2014-05-08 (×3): qty 1

## 2014-05-08 MED ORDER — SODIUM CHLORIDE 0.9 % IV BOLUS (SEPSIS)
1000.0000 mL | Freq: Once | INTRAVENOUS | Status: AC
  Administered 2014-05-08: 1000 mL via INTRAVENOUS

## 2014-05-08 MED ORDER — HEPARIN (PORCINE) IN NACL 2-0.9 UNIT/ML-% IJ SOLN
INTRAMUSCULAR | Status: AC
  Filled 2014-05-08: qty 500

## 2014-05-08 MED ORDER — FUROSEMIDE 10 MG/ML IJ SOLN
80.0000 mg | Freq: Two times a day (BID) | INTRAMUSCULAR | Status: DC
Start: 2014-05-08 — End: 2014-05-08
  Administered 2014-05-08: 80 mg via INTRAVENOUS
  Filled 2014-05-08 (×3): qty 8

## 2014-05-08 MED ORDER — PRO-STAT SUGAR FREE PO LIQD
30.0000 mL | Freq: Every day | ORAL | Status: DC
  Filled 2014-05-08: qty 30

## 2014-05-08 MED ORDER — FUROSEMIDE 10 MG/ML IJ SOLN
80.0000 mg | Freq: Once | INTRAMUSCULAR | Status: AC
  Administered 2014-05-08: 80 mg via INTRAVENOUS

## 2014-05-08 MED ORDER — LIDOCAINE HCL (PF) 1 % IJ SOLN
INTRAMUSCULAR | Status: AC
Start: 2014-05-08 — End: 2014-05-08
  Filled 2014-05-08: qty 30

## 2014-05-08 MED ORDER — NITROGLYCERIN IN D5W 200-5 MCG/ML-% IV SOLN
2.0000 ug/min | Freq: Once | INTRAVENOUS | Status: AC
  Administered 2014-05-08: 50 ug/min via INTRAVENOUS

## 2014-05-08 MED ORDER — PANTOPRAZOLE SODIUM 40 MG IV SOLR
40.0000 mg | INTRAVENOUS | Status: DC
  Administered 2014-05-08 – 2014-05-09 (×2): 40 mg via INTRAVENOUS
  Filled 2014-05-08 (×2): qty 40

## 2014-05-08 MED ORDER — MAGNESIUM SULFATE 40 MG/ML IJ SOLN
2.0000 g | Freq: Once | INTRAMUSCULAR | Status: AC
  Administered 2014-05-08: 2 g via INTRAVENOUS
  Filled 2014-05-08: qty 50

## 2014-05-08 MED ORDER — VITAL AF 1.2 CAL PO LIQD
1000.0000 mL | ORAL | Status: DC
  Administered 2014-05-08 – 2014-05-09 (×2): 1000 mL
  Filled 2014-05-08 (×5): qty 1000

## 2014-05-08 MED ORDER — FUROSEMIDE 10 MG/ML IJ SOLN
INTRAMUSCULAR | Status: AC
  Filled 2014-05-08: qty 8

## 2014-05-08 MED ORDER — LIDOCAINE HCL (CARDIAC) 20 MG/ML IV SOLN
INTRAVENOUS | Status: AC
  Filled 2014-05-08: qty 5

## 2014-05-08 MED ORDER — NOREPINEPHRINE BITARTRATE 1 MG/ML IV SOLN
2.0000 ug/min | INTRAVENOUS | Status: DC
  Administered 2014-05-08: 2 ug/min via INTRAVENOUS
  Administered 2014-05-09: 4 ug/min via INTRAVENOUS
  Administered 2014-05-09: 8 ug/min via INTRAVENOUS
  Filled 2014-05-08 (×2): qty 4

## 2014-05-08 MED ORDER — FUROSEMIDE 10 MG/ML IJ SOLN
40.0000 mg | Freq: Once | INTRAMUSCULAR | Status: AC
  Administered 2014-05-08: 40 mg via INTRAVENOUS

## 2014-05-08 MED ORDER — CHLORHEXIDINE GLUCONATE 0.12 % MT SOLN
15.0000 mL | Freq: Two times a day (BID) | OROMUCOSAL | Status: DC
  Administered 2014-05-08 – 2014-05-12 (×9): 15 mL via OROMUCOSAL
  Filled 2014-05-08 (×9): qty 15

## 2014-05-08 MED ORDER — CETYLPYRIDINIUM CHLORIDE 0.05 % MT LIQD
7.0000 mL | Freq: Four times a day (QID) | OROMUCOSAL | Status: DC
  Administered 2014-05-08 – 2014-05-12 (×16): 7 mL via OROMUCOSAL

## 2014-05-08 MED ORDER — PROPOFOL 10 MG/ML IV EMUL
5.0000 ug/kg/min | INTRAVENOUS | Status: DC
  Administered 2014-05-08 (×2): 25 ug/kg/min via INTRAVENOUS
  Administered 2014-05-08: 10 ug/kg/min via INTRAVENOUS
  Filled 2014-05-08 (×3): qty 100

## 2014-05-08 MED ORDER — LORAZEPAM 2 MG/ML IJ SOLN
INTRAMUSCULAR | Status: AC
  Administered 2014-05-08: 2 mg
  Filled 2014-05-08: qty 1

## 2014-05-08 MED ORDER — PROPOFOL 10 MG/ML IV EMUL: 5.0000 ug/kg/min | INTRAVENOUS | Status: DC

## 2014-05-08 MED ORDER — ASPIRIN 81 MG PO CHEW: 81.0000 mg | CHEWABLE_TABLET | Freq: Every day | ORAL | Status: DC

## 2014-05-08 MED ORDER — ONDANSETRON HCL 4 MG/2ML IJ SOLN: 4.0000 mg | Freq: Four times a day (QID) | INTRAMUSCULAR | Status: DC | PRN

## 2014-05-08 MED ORDER — LORAZEPAM 2 MG/ML IJ SOLN: 2.0000 mg | Freq: Once | INTRAMUSCULAR | Status: AC

## 2014-05-08 MED ORDER — ASPIRIN EC 81 MG PO TBEC
81.0000 mg | DELAYED_RELEASE_TABLET | Freq: Every day | ORAL | Status: DC
  Administered 2014-05-08 – 2014-05-09 (×2): 81 mg via ORAL
  Filled 2014-05-08 (×3): qty 1

## 2014-05-08 MED ORDER — ROCURONIUM BROMIDE 50 MG/5ML IV SOLN
INTRAVENOUS | Status: AC | PRN
  Administered 2014-05-08: 80 mg via INTRAVENOUS

## 2014-05-08 MED ORDER — POTASSIUM CHLORIDE 10 MEQ/100ML IV SOLN
10.0000 meq | INTRAVENOUS | Status: AC
  Administered 2014-05-08 (×4): 10 meq via INTRAVENOUS
  Filled 2014-05-08 (×3): qty 100

## 2014-05-08 MED ORDER — SODIUM CHLORIDE 0.9 % IJ SOLN
3.0000 mL | Freq: Two times a day (BID) | INTRAMUSCULAR | Status: DC
  Administered 2014-05-08 – 2014-05-10 (×5): 3 mL via INTRAVENOUS

## 2014-05-08 MED ORDER — VERAPAMIL HCL 2.5 MG/ML IV SOLN
INTRAVENOUS | Status: AC
  Filled 2014-05-08: qty 2

## 2014-05-08 MED ORDER — ETOMIDATE 2 MG/ML IV SOLN
INTRAVENOUS | Status: AC
  Filled 2014-05-08: qty 20

## 2014-05-08 MED ORDER — ASPIRIN 300 MG RE SUPP
300.0000 mg | Freq: Once | RECTAL | Status: AC
  Administered 2014-05-08: 300 mg via RECTAL
  Filled 2014-05-08: qty 1

## 2014-05-08 MED ORDER — LABETALOL HCL 5 MG/ML IV SOLN
INTRAVENOUS | Status: AC
  Filled 2014-05-08: qty 4

## 2014-05-08 MED ORDER — NITROGLYCERIN IN D5W 200-5 MCG/ML-% IV SOLN: 2.0000 ug/min | Freq: Once | INTRAVENOUS | Status: DC

## 2014-05-08 MED ORDER — IPRATROPIUM-ALBUTEROL 0.5-2.5 (3) MG/3ML IN SOLN
3.0000 mL | RESPIRATORY_TRACT | Status: DC
  Administered 2014-05-08 – 2014-05-10 (×14): 3 mL via RESPIRATORY_TRACT
  Filled 2014-05-08 (×14): qty 3

## 2014-05-08 MED ORDER — SUCCINYLCHOLINE CHLORIDE 20 MG/ML IJ SOLN
INTRAMUSCULAR | Status: AC
  Filled 2014-05-08: qty 1

## 2014-05-08 MED ORDER — PROPOFOL 10 MG/ML IV EMUL: 0.0000 ug/kg/min | INTRAVENOUS | Status: DC

## 2014-05-08 MED ORDER — DEXTROSE 5 % IV SOLN
1.0000 g | INTRAVENOUS | Status: DC
  Administered 2014-05-08 – 2014-05-09 (×2): 1 g via INTRAVENOUS
  Filled 2014-05-08 (×3): qty 10

## 2014-05-08 MED ORDER — NITROGLYCERIN 1 MG/10 ML FOR IR/CATH LAB
INTRA_ARTERIAL | Status: AC
  Filled 2014-05-08: qty 10

## 2014-05-08 MED ORDER — HEPARIN SODIUM (PORCINE) 5000 UNIT/ML IJ SOLN
5000.0000 [IU] | Freq: Three times a day (TID) | INTRAMUSCULAR | Status: DC
  Administered 2014-05-08 – 2014-05-10 (×7): 5000 [IU] via SUBCUTANEOUS
  Filled 2014-05-08 (×10): qty 1

## 2014-05-08 MED ORDER — CLOPIDOGREL BISULFATE 75 MG PO TABS
75.0000 mg | ORAL_TABLET | Freq: Every day | ORAL | Status: DC
  Administered 2014-05-08 – 2014-05-10 (×3): 75 mg via NASOGASTRIC
  Filled 2014-05-08 (×3): qty 1

## 2014-05-08 MED ORDER — HEPARIN SODIUM (PORCINE) 1000 UNIT/ML IJ SOLN
INTRAMUSCULAR | Status: AC
Start: 2014-05-08 — End: 2014-05-08
  Filled 2014-05-08: qty 1

## 2014-05-08 MED ORDER — ACETAMINOPHEN 325 MG PO TABS
650.0000 mg | ORAL_TABLET | ORAL | Status: DC | PRN
Start: 2014-05-08 — End: 2014-05-08

## 2014-05-08 NOTE — Progress Notes (Signed)
BP stable now from 123-157/77-34mmHg.  Will give Lasix 40mg  IV now for CVP 15.  Now in sinus tachycardia with fever up to 100.6.  Will panculture and treat fever with Tylenol.  If HR dose not improve with treatment of fever then start low dose lopressor.

## 2014-05-08 NOTE — Progress Notes (Addendum)
SUBJECTIVE:  Intubated and sedated.  Had hypotension earlier when PEEP increased   OBJECTIVE:   Vitals:   Filed Vitals:   05/08/14 0800 05/08/14 0840 05/08/14 0900 05/08/14 1000  BP: 101/64 76/54  130/72  Pulse: 59 59 58 65  Temp: 97 F (36.1 C)  97.5 F (36.4 C) 98.1 F (36.7 C)  TempSrc:      Resp: 33 25 21 22   Height:      Weight:      SpO2: 100% 100% 100% 100%   I&O's:   Intake/Output Summary (Last 24 hours) at 05/08/14 1137 Last data filed at 05/08/14 1100  Gross per 24 hour  Intake  445.3 ml  Output   2475 ml  Net -2029.7 ml   TELEMETRY: Reviewed telemetry pt in NSR:     PHYSICAL EXAM General: Well developed, well nourished, in no acute distress Head: Eyes PERRLA, No xanthomas.   Normal cephalic and atramatic  Lungs:   Clear bilaterally to auscultation. Heart:   HRRR S1 S2 Pulses are 2+ & equal. Abdomen: Bowel sounds are positive, abdomen soft and non-tender without masses Extremities:   No clubbing, cyanosis or edema.  DP +1 Neuro: Alert and oriented X 3. Psych:  Good affect, responds appropriately   LABS: Basic Metabolic Panel:  Recent Labs  61/84/85 0105  NA 147  K 3.5*  CL 104  CO2 23  GLUCOSE 173*  BUN 10  CREATININE 1.01  CALCIUM 9.1   Liver Function Tests: No results found for this basename: AST, ALT, ALKPHOS, BILITOT, PROT, ALBUMIN,  in the last 72 hours No results found for this basename: LIPASE, AMYLASE,  in the last 72 hours CBC:  Recent Labs  05/08/14 0105  WBC 8.0  NEUTROABS 2.6  HGB 14.7  HCT 45.9  MCV 90.4  PLT 112*   Cardiac Enzymes:  Recent Labs  05/08/14 0105  TROPONINI <0.30   BNP: No components found with this basename: POCBNP,  D-Dimer: No results found for this basename: DDIMER,  in the last 72 hours Hemoglobin A1C: No results found for this basename: HGBA1C,  in the last 72 hours Fasting Lipid Panel:  Recent Labs  05/08/14 0105  TRIG 71   Thyroid Function Tests: No results found for this  basename: TSH, T4TOTAL, FREET3, T3FREE, THYROIDAB,  in the last 72 hours Anemia Panel: No results found for this basename: VITAMINB12, FOLATE, FERRITIN, TIBC, IRON, RETICCTPCT,  in the last 72 hours Coag Panel:   Lab Results  Component Value Date   INR 1.18 05/08/2014    RADIOLOGY: Ct Head Wo Contrast  05/08/2014   CLINICAL DATA:  Seizure activity.  EXAM: CT HEAD WITHOUT CONTRAST  TECHNIQUE: Contiguous axial images were obtained from the base of the skull through the vertex without intravenous contrast.  COMPARISON:  None.  FINDINGS: Mild diffuse cerebral atrophy. Low-attenuation changes in the deep white matter consistent with small vessel ischemia. No mass effect or midline shift. No abnormal extra-axial fluid collections. Gray-white matter junctions are distinct. Basal cisterns are not effaced. No evidence of acute intracranial hemorrhage. No depressed skull fractures. Retention cysts in the left maxillary antrum. Mucosal thickening in the ethmoid air cells. Mastoid air cells are not opacified. Vascular calcifications.  IMPRESSION: No acute intracranial abnormality.  Mild chronic atrophy.   Electronically Signed   By: Burman Nieves M.D.   On: 05/08/2014 04:41   Dg Chest Portable 1 View  05/08/2014   CLINICAL DATA:  Chest pain.  EXAM: PORTABLE CHEST -  1 VIEW  COMPARISON:  None.  FINDINGS: The heart is mildly enlarged. The patient is intubated. The endotracheal tube terminates 5 cm above the carina. An NG tube courses off the inferior border of the film. Compared where pads in place. Diffuse interstitial and airspace disease is noted, most prominent in the right middle lobe. The visualized soft tissues and bony thorax are otherwise unremarkable.  IMPRESSION: 1. The patient is intubated. 2. Diffuse interstitial and airspace disease may represent infection or edema. 3. Mild cardiomegaly. 4. Defibrillator pads are in place.   Electronically Signed   By: Gennette Pac M.D.   On: 05/08/2014 01:43       ASSESSMENT:  1.  Acute hypoxic and hypercanic respiratory failure with respiratory arrest secondary to acute pulmonary edema.  Blood gas improved this am with PO2 162 on 100% FIO2.  Still with high O2 requirement. 2.  Ventricular tachycardia with no reoccurence 3.  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  Acute pulmonary edema secondary to hypertensive emergency - he is 2L negative 5.  Chronic LBBB 6.  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  Malignant HTN now hypotensive earlier this am as low as systolic after PEEP increased.  BP meds and Lasix held.  BP stable now 8.  Thrombocytopenia ? Etiology 9.  Hypokalemia - replete  PLAN:   1.  2D echo pending this am 2.  Restart ASA/Plavix/statin 3.  If BP remains stable will restart coreg/ARB.  He was on both ARB and low dose ACE I at home. 4.  Check CVP once central line in place 5.  Follow platelet count closely with restarting ASA and Plavix  A total of 30 minutes critical care time spent in direct patient care  Quintella Reichert, MD  05/08/2014  11:37 AM

## 2014-05-08 NOTE — Procedures (Signed)
Arterial Catheter Insertion Procedure Note [name] [id number] May 20, 1941  Procedure: Insertion of Arterial Catheter  Indications: Blood pressure monitoring  Procedure Details Consent: Risks of procedure as well as the alternatives and risks of each were explained to the (patient/caregiver).  Consent for procedure obtained. Time Out: Verified patient identification, verified procedure, site/side was marked, verified correct patient position, special equipment/implants available, medications/allergies/relevent history reviewed, required imaging and test results available.  Performed  Maximum sterile technique was used including antiseptics, cap, gloves, gown, hand hygiene and mask. Skin prep: Chlorhexidine; local anesthetic administered 20 gauge catheter was inserted into left radial artery using the Seldinger technique.  Evaluation Blood flow good; BP tracing good. Complications: No apparent complications.   [name] 05/08/2014

## 2014-05-08 NOTE — CV Procedure (Signed)
Cardiac Catheterization Procedure Note  Name: [name] MRN: [id number] DOB: Mar 05, 1941  Procedure: Left Heart Cath, Selective Coronary Angiography, LV angiography  Indication: This is a 73 year old male with a history of known coronary artery disease. He has been followed by Dr. [place]. His last heart catheterization reportedly showed chronic total occlusion of the obtuse marginal branch the left circumflex and severe stenosis of the first diagonal branch. He's undergone RCA stenting in the past and this was noted to be patent at the time of his last heart catheterization in December 2014. The patient developed severe chest pain and shortness of breath tonight. He stopped on the road side and called EMS. He had a respiratory arrest requiring intubation. EKG showed left bundle branch block which is known to be old. However there were significant ST changes and considering his presentation there were concerns for acute MI. The patient also had a rod of ventricular tachycardia while he was being intubated. We brought emergently to the cardiac catheterization lab.   Procedural Details: The right wrist was prepped, draped, and anesthetized with 1% lidocaine. Using the modified Seldinger technique, a 5/6 French Slender sheath was introduced into the right radial artery. 3 mg of verapamil was administered through the sheath, weight-based unfractionated heparin was administered intravenously. Standard Judkins catheters were used for selective coronary angiography and left ventriculography. Catheter exchanges were performed over an exchange length guidewire. Initially the patient's blood pressure was markedly elevated at greater than 200/100 despite high dose IV nitroglycerin. We administered labetalol 10 mg and there was marked reduction in his blood pressure and heart rate following this. IV nitroglycerin was turned off and the patient remained stable throughout the procedure with heart rate in the 50s  to 60s and a blood pressure in the range of 110/60. There were no immediate procedural complications. A TR band was used for radial hemostasis at the completion of the procedure.  The patient was transferred to the post catheterization recovery area for further monitoring.  Procedural Findings: Hemodynamics: AO 108/61 LV 106/14  Coronary angiography: Coronary dominance: right  Left mainstem: The left main is widely patent with no obstruction.  Left anterior descending (LAD): The LAD is a large caliber vessel wrapping around the left ventricular apex. The proximal LAD has a long area of mild stenosis estimated at approximately 40-50% at most. The first diagonal has a subtotal stenosis of 95-99%. It originates at acute angle from the LAD and is small in caliber.  Left circumflex (LCx): The left circumflex supplies a large intermediate branch with a 30-40% stenosis. The AV circumflex extends down and is patent. However, the first obtuse marginal is chronically occluded with left to left collaterals.  Right coronary artery (RCA): Dominant vessel. The proximal vessel has 30-40% stenosis. The mid vessel is stented and the stented segment is widely patent. The distal RCA is tortuous. The PDA and PLA branches are patent.  Left ventriculography: The basal and midinferior wall is akinetic. The other LV wall segments are diffusely hypokinetic. The estimated LVEF is 35%. There is mild mitral regurgitation.  Contrast: 75 cc Omnipaque  Radiation dose/Fluoro time: 3.6 minutes  Estimated Blood Loss: Minimal  Final Conclusions:   1. Coronary artery disease with nonobstructive LAD stenosis, severe diagonal stenosis, total occlusion of the first OM branch, and patency of the RCA stented segment 2. Severe segmental LV systolic dysfunction 3. Normal LVEDP after administration of IV nitroglycerin and IV beta blocker 4. Suspected respiratory failure from acute pulmonary edema/hypertensive  emergency  Recommendations: The patient's coronary anatomy is unchanged from previous studies. He will be treated medically. His respiratory status is tenuous at present and despite mechanical ventilation on 100% FiO2, he remains severely hypoxemic. CCM is present and managing the patient's respiratory status. Appreciate their assistance in his care.  Tonny Bollman MD, [facility] 05/08/2014, 2:20 AM

## 2014-05-08 NOTE — Progress Notes (Signed)
ABG was done on these settings.

## 2014-05-08 NOTE — Procedures (Signed)
Central Venous Catheter Insertion Procedure Note Khadyn Grohman [id number] 1941-07-15  Procedure: Insertion of Central Venous Catheter Indications: Assessment of intravascular volume and Drug and/or fluid administration  Procedure Details Consent: Unable to obtain consent because of emergent medical necessity. Time Out: Verified patient identification, verified procedure, site/side was marked, verified correct patient position, special equipment/implants available, medications/allergies/relevent history reviewed, required imaging and test results available.  Performed  Maximum sterile technique was used including antiseptics, cap, gloves, gown, hand hygiene, mask and sheet. Skin prep: Chlorhexidine; local anesthetic administered A antimicrobial bonded/coated triple lumen catheter was placed in the right subclavian vein using the Seldinger technique.  Evaluation Blood flow good Complications: No apparent complications Patient did tolerate procedure well. Chest X-ray ordered to verify placement.  CXR: pending.  U/S used in placement.  Eloni Darius 05/08/2014, 1:08 PM

## 2014-05-08 NOTE — Consult Note (Signed)
PULMONARY  / CRITICAL CARE MEDICINE CONSULTATION   Name: [name] MRN: [id number] DOB: [birth year]    ADMISSION DATE:  05/08/2014 CONSULTATION DATE: May 08, 2014  REQUESTING CLINICIAN: Excell Seltzer PRIMARY SERVICE: Cardiology  CHIEF COMPLAINT:  Respiratory Arrest  BRIEF PATIENT DESCRIPTION: 73 y/o man with CAD, developed hypertension w/ flash pulmonary edema, severe hypoxia/hypercarbia.  SIGNIFICANT EVENTS / STUDIES:  Cardaic cath Intubation VT - without pulselessness  LINES / TUBES: ETT R radial sheath  CULTURES: None  ANTIBIOTICS: None  HISTORY OF PRESENT ILLNESS:  73 y/o man with a history of a ICM (EF 40%), COPD, and medication noncompliance who presented to [facility] after developing sudden onset dyspnea, in which he apparently stopped a passing vehicle and was noted to be in respiratory distress. EMS was called and the patient was brought to the ED. He was found to be significantly hypertensive and hypoxic. He was intubated, and apparently went into VT around the time of intubation, but self converted to sinus rhythm. Cardiology was called for possible STEMI given ST changes and clinical presentation. He was taken to the cath lab where he was found to have no acute thrombosis. He remained in severe hypoxic and hypercapnic respiratory failure. The patients family state that he was in his usual state of health today, went to work, and was out at [employer]. They had been expecting him home at around midnight.   PAST MEDICAL HISTORY :  Reviewed in chart under MRN: [id number]. [place], CAD, ICM, COPD  Prior to Admission medications   Not on File   Not on File  FAMILY HISTORY:  No history of lung disease SOCIAL HISTORY: Current every day smoker. No drugs  REVIEW OF SYSTEMS:  Unable to obtain 2/2 AMS.  SUBJECTIVE:   VITAL SIGNS: Temp:  [98.9 F (37.2 C)] 98.9 F (37.2 C) (08/29 0120) Pulse Rate:  [107-123] 107 (08/29 0126) Resp:  [11-16] 14 (08/29 0126) BP:  (140-218)/(95-140) 218/140 mmHg (08/29 0126) SpO2:  [50 %-100 %] 89 % (08/29 0126) FiO2 (%):  [100 %] 100 % (08/29 0112) Weight:  [165 lb (74.844 kg)] 165 lb (74.844 kg) (08/29 0107) HEMODYNAMICS:   VENTILATOR SETTINGS: Vent Mode:  [-] PRVC FiO2 (%):  [100 %] 100 % Set Rate:  [14 bmp] 14 bmp Vt Set:  [400 mL] 400 mL PEEP:  [5 cmH20] 5 cmH20 Plateau Pressure:  [22 cmH20] 22 cmH20 INTAKE / OUTPUT: Intake/Output   None     PHYSICAL EXAMINATION: General:  Intubated, appears stated age Neuro:  minimal responsiveness with noxious stimulation, weak cough. HEENT:  ETT in good position Neck: No JVD Cardiovascular:  Normal rate, regular rhythm Lungs:  Rales heard over mechanical breath sounds. Abdomen:  Soft Musculoskeletal:  No inflammatory arthritidis noted. Skin:  Intact, no rash  LABS:  CBC  Recent Labs Lab 05/08/14 0105  WBC 8.0  HGB 14.7  HCT 45.9  PLT 112*   Coag's  Recent Labs Lab 05/08/14 0105  APTT 26  INR 1.18   BMET  Recent Labs Lab 05/08/14 0105  NA 147  K 3.5*  CL 104  CO2 23  BUN 10  CREATININE 1.01  GLUCOSE 173*   Electrolytes  Recent Labs Lab 05/08/14 0105  CALCIUM 9.1   Sepsis Markers No results found for this basename: LATICACIDVEN, PROCALCITON, O2SATVEN,  in the last 168 hours ABG  Recent Labs Lab 05/08/14 0132  PHART 7.003*  PCO2ART 93.6*  PO2ART 77.0*   Liver Enzymes No results found for this basename: AST,  ALT, ALKPHOS, BILITOT, ALBUMIN,  in the last 168 hours Cardiac Enzymes  Recent Labs Lab 05/08/14 0105  TROPONINI <0.30   Glucose No results found for this basename: GLUCAP,  in the last 168 hours  Imaging Dg Chest Portable 1 View  05/08/2014   CLINICAL DATA:  Chest pain.  EXAM: PORTABLE CHEST - 1 VIEW  COMPARISON:  None.  FINDINGS: The heart is mildly enlarged. The patient is intubated. The endotracheal tube terminates 5 cm above the carina. An NG tube courses off the inferior border of the film. Compared  where pads in place. Diffuse interstitial and airspace disease is noted, most prominent in the right middle lobe. The visualized soft tissues and bony thorax are otherwise unremarkable.  IMPRESSION: 1. The patient is intubated. 2. Diffuse interstitial and airspace disease may represent infection or edema. 3. Mild cardiomegaly. 4. Defibrillator pads are in place.   Electronically Signed   By: Gennette Pac M.D.   On: 05/08/2014 01:43    EKG: LBB with ST elevations seen in anterior leads. CXR: bilateral patchy airspace disease sparing the periphery, most dense in R middle lobe.  ASSESSMENT / PLAN:  PULMONARY A: Severe Cardiogenic Pulmonary Edema Severe Hypercarbic Respiratory failure P:   Possible non-cardiogenic component; e.g. Alveolar hemorrhage, toxic exposure, etc. Will treat at ARDS; continue to adjust ventilator to maintain PaO2 > 55, pH > 7.20. Presently on rate of 30, 6 mL/kg of IBW, limited by high peak / plateau pressures, presence of 5 cm of H20 of AutoPEEP above set PEEP of 10. Patient does appear to be PEEP responsive. Trend ABGs. Consider paralytics if persistent hypoxia.  CARDIOVASCULAR A: Hypertensive Emergency CAD Systolic HF P:   S/p cath. Defer to cardiology for management of CAD, but maintain goal MAPs < 160. Beta-blocker use limited by bradycardia. Will diurese, good UOP so far.  RENAL A: Normal renal function on labs, good UOP P:   F/u BMP.  GASTROINTESTINAL A: No acute issues. P:    HEMATOLOGIC A: Thrombocytopenia P:   Unclear etiology; will trend.  INFECTIOUS A: No active infections P:     ENDOCRINE A: Critical Care Glycemic Goals P:   ICU glucose management protocol.  NEUROLOGIC A: Acute Encephalopathy  P:    Hold sedation and assess mental status. As patient appears to be waking up, will favor starting propofol.  TODAY'S SUMMARY: 73 y/o man with hypertensive emergency, refractory hypoxic and hypercarbic respiratory failure.  I have  personally obtained a history, examined the patient, evaluated laboratory and imaging results, formulated the assessment and plan and placed orders.  CRITICAL CARE: The patient is critically ill with multiple organ systems failure and requires high complexity decision making for assessment and support, frequent evaluation and titration of therapies, application of advanced monitoring technologies and extensive interpretation of multiple databases. Critical Care Time devoted to patient care services described in this note is 70 minutes.   Evalyn Casco, MD Pulmonary and Critical Care Medicine [facility] Pager: [phone]   05/08/2014, 2:26 AM

## 2014-05-08 NOTE — ED Notes (Signed)
Patient flagged a car down because he thought he was having a heart attack. Per witness patient stated he had a previous MI and then collapsed and became unresponsive. On EMS arrival patient was guppy breathing with sats in the 50's. EMS began bagging patient and sats came up to 80's. BP 240/140 ETCO2 40-50 HR 70's.  CBG 131

## 2014-05-08 NOTE — H&P (Signed)
Physician History and Physical    Alexanderjames Berg MRN: [id number] DOB/AGE: Aug 21, 1941 73 y.o. Admit date: 05/08/2014  Primary Cardiologist:  Peter [place]  CC:  Acute respiratory failure/pulmonary edema and suspected STEMI  HPI:  Mr. 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 Apparently patient last seen by family normal at 3 pm, he went to "[place]" with friend. At about 12:20 am on his way back, he developed severe chest pain and SOB in a parking lot and asked a bystander to call 911. He collapsed before EMS arrived. After he was brought to ER, he was intubated due to AMS and respiratory failure, he was in VT for about 30 sec during intubation and self converted. STEMI pager was then activated. Considering ST changes in addition to known LBBB and his clinical presentation, He was taken to cath lab urgently . He was in hypertensive emergency with systolic up to 240s while on 150 mcg of Nitro gtt and he was in persistent hypoxia.   LHC was done radially which showed essentially no significant changes compared to previous study therefore no PCI performed. His BP and HR responded to labetalol promptly in the cath lab but he remained hypoxic and his vent setting was maximized by CCM.   Patient was brought to CCU after cath and Nitro gtt restarted due to rising BP and bradycardia 50s. He developed seizure like activities that is new since admission.   Past Medical History   Diagnosis  Date   .  Dyslipidemia      on statin   .  LBBB (left bundle branch block)    .  CAD (coronary artery disease)      s/p stent RCA DES 9/09; s/p Myoview 2013 with reduced EF to 40%; no ischemia   .  HTN (hypertension)      Reports intolerance to hydralazine; no beta blockers 2/2 bradycardia; failed on ACE and ARB   .   Noncompliance    .  LV dysfunction      EF about 40 to 45% per echo in April 2013   .  LVH (left ventricular hypertrophy)    .  COPD (chronic obstructive pulmonary disease)  dx 06/2013     PFTs 07/08/13: mod obst with resp to bronchodilator, moderate decrease diffusion, airtrapping   .  Chronic low back pain     Past Surgical History   Procedure  Laterality  Date   .  Cardiac catheterization       ejection fraction 50%     Review of systems: A review of 10 organ systems was done and is negative except as stated above in HPI   History   Social History  . Marital Status: Married    Spouse Name: N/A    Number of Children: N/A  . Years of Education: N/A   Occupational History  . Not on file.   Social History Main Topics  . Smoking status: Not on file  . Smokeless tobacco: Not on file  . Alcohol Use: Not on file  . Drug Use: Not on file  . Sexual Activity: Not on file   Other Topics Concern  . Not on file   Social History Narrative  . No narrative on file    No family history on  file.   Not on File  No prescriptions prior to admission    Current facility-administered medications:etomidate (AMIDATE) 2 MG/ML injection, , , , ;  lidocaine (cardiac) 100 mg/21ml (XYLOCAINE) 20 MG/ML injection 2%, , , , ;  rocuronium (ZEMURON) 50 MG/5ML injection, , , , ;  succinylcholine (ANECTINE) 20 MG/ML injection, , , ,   Physical Exam: Blood pressure 218/140, pulse 25, temperature 98.9 F (37.2 C), temperature source Rectal, resp. rate 14, height 5\' 9"  (1.753 m), weight 165 lb (74.844 kg), SpO2 89.00%.; Body mass index is 24.36 kg/(m^2). Temp:  [98.9 F (37.2 C)] 98.9 F (37.2 C) (08/29 0120) Pulse Rate:  [25-123] 25 (08/29 0149) Resp:  [11-16] 14 (08/29 0126) BP: (140-218)/(95-140) 218/140 mmHg (08/29 0126) SpO2:  [50 %-100 %] 89 % (08/29 0126) FiO2 (%):  [100 %] 100 % (08/29 0239) Weight:  [165 lb (74.844 kg)] 165 lb (74.844 kg) (08/29 0107)  No intake or output data in the  24 hours ending 05/08/14 0244 General: NAD Heent: MMM Neck: No JVD  CV: Nondisplaced PMI.  RRR, nl S1/S2, no S3/S4, no murmur. No carotid bruit   Lungs: Clear to auscultation bilaterally with normal respiratory effort Abdomen: Soft, nontender, nondistended Extremities: No clubbing or cyanosis.  Normal pedal pulses. No pedal edema Skin: Intact without lesions or rashes  Neurologic: Alert and oriented x 3, grossly nonfocal  Psych: Normal mood and affect    Labs:  Recent Labs  05/08/14 0105  TROPONINI <0.30   Lab Results  Component Value Date   WBC 8.0 05/08/2014   HGB 14.7 05/08/2014   HCT 45.9 05/08/2014   MCV 90.4 05/08/2014   PLT 112* 05/08/2014    Recent Labs Lab 05/08/14 0105  NA 147  K 3.5*  CL 104  CO2 23  BUN 10  CREATININE 1.01  CALCIUM 9.1  GLUCOSE 173*   No results found for this basename: CHOL, HDL, LDLCALC, TRIG    EKG:  LBBB with ST elevation 5 mm V1-V3 Cath:  See cath report for details. No sig changes from previousl study. No PCI performed.  Radiology:  Dg Chest Portable 1 View  05/08/2014   CLINICAL DATA:  Chest pain.  EXAM: PORTABLE CHEST - 1 VIEW  COMPARISON:  None.  FINDINGS: The heart is mildly enlarged. The patient is intubated. The endotracheal tube terminates 5 cm above the carina. An NG tube courses off the inferior border of the film. Compared where pads in place. Diffuse interstitial and airspace disease is noted, most prominent in the right middle lobe. The visualized soft tissues and bony thorax are otherwise unremarkable.  IMPRESSION: 1. The patient is intubated. 2. Diffuse interstitial and airspace disease may represent infection or edema. 3. Mild cardiomegaly. 4. Defibrillator pads are in place.   Electronically Signed   By: Gennette Pac M.D.   On: 05/08/2014 01:43    ASSESSMENT:  73 yo male with known CAD presented with:  1. Acute pulmonary edema in the setting of HTN emergency 2. Acute hypoxic and hypercapnic respiratory failure 2/2  #1 3. AMS, likely 2/2 #2.  4. Seizure-like activities, rule out CNS process  PLAN:  1. S/p emergent left heart cath shown stable CAD and no acute MI 2. Aggressive diuresis, now negative 1500 ml with Lasix 80 mg IV x1, will schedule lasix 80 IV BID with goal negative 2-2.5 L in the next 24 hrs.  3. CCM consulted, appreciate assistance. Respiratory/vent management per CCM. 4. Optimize BP control with Nitro gtt  and Labetalol PRN as HR tolerates.  5. CT head w/o contrast to rule out CNS process. 6. Tox screen 7. Echocardiogram.  Signed: Haydee Salter, MD Cardiology Fellow 05/08/2014, 2:44 AM

## 2014-05-08 NOTE — Progress Notes (Signed)
UR Completed.  [name] [phone] 05/08/2014  

## 2014-05-08 NOTE — Progress Notes (Signed)
Vent changes made per MD. Recheck ABG in 30 minutes. Notify MD of results. RT will continue to monitor.

## 2014-05-08 NOTE — ED Notes (Signed)
Cath lab ready

## 2014-05-08 NOTE — Progress Notes (Signed)
eLink Physician-Brief Progress Note Patient Name: [name] DOB: 20-Nov-1940 MRN: [id number]   Date of Service  05/08/2014  HPI/Events of Note  Patient admitted with CHF but now also with fever and ? Focal RLL infiltrate.  eICU Interventions  Empiric abx for CAP     Intervention Category Major Interventions: Infection - evaluation and management  Henry Russel, P 05/08/2014, 7:10 PM
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
## 2014-05-08 NOTE — Progress Notes (Signed)
Changed rate to 26 and peep to 16.

## 2014-05-08 NOTE — Progress Notes (Signed)
  Echocardiogram 2D Echocardiogram has been performed.  Cathie Beams 05/08/2014, 11:33 AM

## 2014-05-08 NOTE — Progress Notes (Signed)
eLink Physician-Brief Progress Note Patient Name: [name] DOB: 1941/07/25 MRN: [id number]   Date of Service  05/08/2014  HPI/Events of Note  Low mg   eICU Interventions  replaced     Intervention Category Minor Interventions: Electrolytes abnormality - evaluation and management  Henry Russel, P 05/08/2014, 7:18 PM

## 2014-05-08 NOTE — Progress Notes (Signed)
PULMONARY  / CRITICAL CARE MEDICINE CONSULTATION   Name: [name] MRN: [id number] DOB: Jul 20, 1941    ADMISSION DATE:  05/08/2014 CONSULTATION DATE: May 08, 2014  REQUESTING CLINICIAN: Excell Seltzer PRIMARY SERVICE: Cardiology  CHIEF COMPLAINT:  Respiratory Arrest  BRIEF PATIENT DESCRIPTION: 73 y/o man with CAD, developed hypertension w/ flash pulmonary edema, severe hypoxia/hypercarbia.  SIGNIFICANT EVENTS / STUDIES:  Cardaic cath Intubation VT - without pulselessness  LINES / TUBES: ETT R radial sheath  CULTURES: None  ANTIBIOTICS: None  SUBJECTIVE: Hypotensive since admission.  VITAL SIGNS: Temp:  [96.4 F (35.8 C)-98.9 F (37.2 C)] 97 F (36.1 C) (08/29 0800) Pulse Rate:  [25-123] 59 (08/29 0800) Resp:  [11-33] 33 (08/29 0800) BP: (67-218)/(46-140) 101/64 mmHg (08/29 0800) SpO2:  [50 %-100 %] 100 % (08/29 0800) Arterial Line BP: (59-202)/(45-89) 99/89 mmHg (08/29 0800) FiO2 (%):  [100 %] 100 % (08/29 0756) Weight:  [145 lb 1 oz (65.8 kg)-165 lb (74.844 kg)] 145 lb 1 oz (65.8 kg) (08/29 0404)  HEMODYNAMICS:   VENTILATOR SETTINGS: Vent Mode:  [-] PRVC FiO2 (%):  [100 %] 100 % Set Rate:  [14 bmp-30 bmp] 26 bmp Vt Set:  [400 mL-450 mL] 450 mL PEEP:  [5 cmH20-18 cmH20] 16 cmH20 Plateau Pressure:  [22 cmH20-36 cmH20] 29 cmH20  INTAKE / OUTPUT: Intake/Output     08/28 0701 - 08/29 0700 08/29 0701 - 08/30 0700   I.V. (mL/kg) 53.6 (0.8)    IV Piggyback 200    Total Intake(mL/kg) 253.6 (3.9)    Urine (mL/kg/hr) 1850    Total Output 1850     Net -1596.4           PHYSICAL EXAMINATION: General:  Intubated, appears stated age Neuro:  Sedated and intubated. HEENT:  ETT in good position. Neck: No JVD. Cardiovascular:  Normal rate, regular rhythm. Lungs:  Rales heard over mechanical breath sounds. Abdomen:  Soft, NT, ND and +BS. Musculoskeletal:  No inflammatory arthritidis noted. Skin:  Intact, no rash.  LABS:  CBC  Recent Labs Lab 05/08/14 0105   WBC 8.0  HGB 14.7  HCT 45.9  PLT 112*   Coag's  Recent Labs Lab 05/08/14 0105  APTT 26  INR 1.18   BMET  Recent Labs Lab 05/08/14 0105  NA 147  K 3.5*  CL 104  CO2 23  BUN 10  CREATININE 1.01  GLUCOSE 173*   Electrolytes  Recent Labs Lab 05/08/14 0105  CALCIUM 9.1   Sepsis Markers No results found for this basename: LATICACIDVEN, PROCALCITON, O2SATVEN,  in the last 168 hours  ABG  Recent Labs Lab 05/08/14 0132 05/08/14 0349 05/08/14 0604  PHART 7.003* 7.229* 7.368  PCO2ART 93.6* 64.6* 45.3*  PO2ART 77.0* 77.0* 44.0*   Liver Enzymes No results found for this basename: AST, ALT, ALKPHOS, BILITOT, ALBUMIN,  in the last 168 hours  Cardiac Enzymes  Recent Labs Lab 05/08/14 0105  TROPONINI <0.30   Glucose  Recent Labs Lab 05/08/14 0310  GLUCAP 199*   Imaging Ct Head Wo Contrast  05/08/2014   CLINICAL DATA:  Seizure activity.  EXAM: CT HEAD WITHOUT CONTRAST  TECHNIQUE: Contiguous axial images were obtained from the base of the skull through the vertex without intravenous contrast.  COMPARISON:  None.  FINDINGS: Mild diffuse cerebral atrophy. Low-attenuation changes in the deep white matter consistent with small vessel ischemia. No mass effect or midline shift. No abnormal extra-axial fluid collections. Gray-white matter junctions are distinct. Basal cisterns are not effaced. No evidence of acute  intracranial hemorrhage. No depressed skull fractures. Retention cysts in the left maxillary antrum. Mucosal thickening in the ethmoid air cells. Mastoid air cells are not opacified. Vascular calcifications.  IMPRESSION: No acute intracranial abnormality.  Mild chronic atrophy.   Electronically Signed   By: Burman Nieves M.D.   On: 05/08/2014 04:41   Dg Chest Portable 1 View  05/08/2014   CLINICAL DATA:  Chest pain.  EXAM: PORTABLE CHEST - 1 VIEW  COMPARISON:  None.  FINDINGS: The heart is mildly enlarged. The patient is intubated. The endotracheal tube  terminates 5 cm above the carina. An NG tube courses off the inferior border of the film. Compared where pads in place. Diffuse interstitial and airspace disease is noted, most prominent in the right middle lobe. The visualized soft tissues and bony thorax are otherwise unremarkable.  IMPRESSION: 1. The patient is intubated. 2. Diffuse interstitial and airspace disease may represent infection or edema. 3. Mild cardiomegaly. 4. Defibrillator pads are in place.   Electronically Signed   By: Gennette Pac M.D.   On: 05/08/2014 01:43   EKG: LBB with ST elevations seen in anterior leads. CXR: bilateral patchy airspace disease sparing the periphery, most dense in R middle lobe.  ASSESSMENT / PLAN:  PULMONARY A: Severe Cardiogenic Pulmonary Edema Severe Hypoxic Respiratory failure P:   Change vent to PCV with higher inspiratory time given air hunger demonstrated by patient. Maintain PEEP at 15. F/U ABG. CXR and ABG in AM. Will need diureses once BP allows.  CARDIOVASCULAR A: Hypertensive Emergency on presentation now hypotensive CAD Systolic HF  P:   Place TLC. Start levophed. Follow CVP. Hold all anti-HTN. D/C diureses for now  RENAL A: Normal renal function on labs, good UOP Hypokalemia P:   BMP in AM. Replace electrolytes as indicated. Hold further diureses while hypotensive.  GASTROINTESTINAL A: No acute issues. P:   Consult nutrition for TF per nutrition.  HEMATOLOGIC A: Thrombocytopenia P:   Unclear etiology; will trend.  INFECTIOUS A: No active infections P:     ENDOCRINE A: Critical Care Glycemic Goals P:   ICU glucose management protocol.  NEUROLOGIC A: Acute Encephalopathy  P:    Propofol currently, if remains hypotensive will likely switch to versed/fentanyl. Maintain sedated while profoundly hypoxemic.  TODAY'S SUMMARY: Change to PCV, place TLC and start levophed, follow CVP.  I have personally obtained a history, examined the patient,  evaluated laboratory and imaging results, formulated the assessment and plan and placed orders.  CRITICAL CARE: The patient is critically ill with multiple organ systems failure and requires high complexity decision making for assessment and support, frequent evaluation and titration of therapies, application of advanced monitoring technologies and extensive interpretation of multiple databases. Critical Care Time devoted to patient care services described in this note is 35 minutes.   Alyson Reedy, M.D. [facility] Pulmonary/Critical Care Medicine. Pager: [phone]. After hours pager: [phone].  05/08/2014, 8:19 AM

## 2014-05-08 NOTE — Progress Notes (Signed)
eLink Physician-Brief Progress Note Patient Name: Anirudh Givins DOB: 1940-09-14 MRN: [id number]   Date of Service  05/08/2014  HPI/Events of Note  Best Practice  eICU Interventions  Stress ulcer propy ordered while on vent     Intervention Category Intermediate Interventions: Best-practice therapies (e.g. DVT, beta blocker, etc.)  Hazelle Woollard 05/08/2014, 4:39 AM

## 2014-05-09 ENCOUNTER — Inpatient Hospital Stay (HOSPITAL_COMMUNITY): Payer: Medicare Other

## 2014-05-09 LAB — CBC
HCT: 35.6 % — ABNORMAL LOW (ref 39.0–52.0)
Hemoglobin: 11.8 g/dL — ABNORMAL LOW (ref 13.0–17.0)
MCH: 28.4 pg (ref 26.0–34.0)
MCHC: 33.1 g/dL (ref 30.0–36.0)
MCV: 85.6 fL (ref 78.0–100.0)
PLATELETS: 106 10*3/uL — AB (ref 150–400)
RBC: 4.16 MIL/uL — ABNORMAL LOW (ref 4.22–5.81)
RDW: 14.2 % (ref 11.5–15.5)
WBC: 10.5 10*3/uL (ref 4.0–10.5)

## 2014-05-09 LAB — POCT I-STAT 3, ART BLOOD GAS (G3+)
Acid-Base Excess: 1 mmol/L (ref 0.0–2.0)
BICARBONATE: 26.9 meq/L — AB (ref 20.0–24.0)
O2 Saturation: 98 %
PH ART: 7.376 (ref 7.350–7.450)
PO2 ART: 109 mmHg — AB (ref 80.0–100.0)
TCO2: 28 mmol/L (ref 0–100)
pCO2 arterial: 46.1 mmHg — ABNORMAL HIGH (ref 35.0–45.0)

## 2014-05-09 LAB — GLUCOSE, CAPILLARY
GLUCOSE-CAPILLARY: 117 mg/dL — AB (ref 70–99)
GLUCOSE-CAPILLARY: 142 mg/dL — AB (ref 70–99)
Glucose-Capillary: 118 mg/dL — ABNORMAL HIGH (ref 70–99)
Glucose-Capillary: 120 mg/dL — ABNORMAL HIGH (ref 70–99)
Glucose-Capillary: 125 mg/dL — ABNORMAL HIGH (ref 70–99)
Glucose-Capillary: 131 mg/dL — ABNORMAL HIGH (ref 70–99)

## 2014-05-09 LAB — BASIC METABOLIC PANEL
ANION GAP: 15 (ref 5–15)
BUN: 18 mg/dL (ref 6–23)
CALCIUM: 7.8 mg/dL — AB (ref 8.4–10.5)
CO2: 25 mEq/L (ref 19–32)
Chloride: 103 mEq/L (ref 96–112)
Creatinine, Ser: 1.07 mg/dL (ref 0.50–1.35)
GFR calc non Af Amer: 68 mL/min — ABNORMAL LOW (ref >90)
GFR, EST AFRICAN AMERICAN: 79 mL/min — AB (ref >90)
Glucose, Bld: 131 mg/dL — ABNORMAL HIGH (ref 70–99)
Potassium: 3.3 mEq/L — ABNORMAL LOW (ref 3.7–5.3)
SODIUM: 143 meq/L (ref 137–147)

## 2014-05-09 LAB — MAGNESIUM: MAGNESIUM: 2.7 mg/dL — AB (ref 1.5–2.5)

## 2014-05-09 LAB — PHOSPHORUS: Phosphorus: 3.3 mg/dL (ref 2.3–4.6)

## 2014-05-09 LAB — PROCALCITONIN: Procalcitonin: 2.39 ng/mL

## 2014-05-09 MED ORDER — MIDAZOLAM HCL 2 MG/2ML IJ SOLN
1.0000 mg | INTRAMUSCULAR | Status: DC | PRN
  Administered 2014-05-09 – 2014-05-10 (×6): 2 mg via INTRAVENOUS
  Filled 2014-05-09 (×6): qty 2

## 2014-05-09 MED ORDER — PROPOFOL 10 MG/ML IV EMUL
5.0000 ug/kg/min | INTRAVENOUS | Status: DC
  Administered 2014-05-09: 25 ug/kg/min via INTRAVENOUS
  Administered 2014-05-09: 35 ug/kg/min via INTRAVENOUS
  Filled 2014-05-09 (×2): qty 100

## 2014-05-09 MED ORDER — SODIUM CHLORIDE 0.9 % IV SOLN
25.0000 ug/h | INTRAVENOUS | Status: DC
  Administered 2014-05-09: 225 ug/h via INTRAVENOUS
  Administered 2014-05-09: 25 ug/h via INTRAVENOUS
  Filled 2014-05-09 (×3): qty 50

## 2014-05-09 MED ORDER — POTASSIUM CHLORIDE 20 MEQ/15ML (10%) PO LIQD
40.0000 meq | ORAL | Status: AC
  Administered 2014-05-09 (×2): 40 meq
  Filled 2014-05-09 (×2): qty 30

## 2014-05-09 MED ORDER — PANTOPRAZOLE SODIUM 40 MG PO PACK
40.0000 mg | PACK | Freq: Every day | ORAL | Status: DC
  Administered 2014-05-10: 40 mg
  Filled 2014-05-09: qty 20

## 2014-05-09 MED FILL — Medication: Qty: 1 | Status: AC

## 2014-05-09 NOTE — Progress Notes (Signed)
PULMONARY  / CRITICAL CARE MEDICINE CONSULTATION   Name: [name] MRN: [id number] DOB: 1940/12/03    ADMISSION DATE:  05/08/2014 CONSULTATION DATE: May 09, 2014  REQUESTING CLINICIAN: Excell Seltzer PRIMARY SERVICE: Cardiology  CHIEF COMPLAINT:  Respiratory Arrest  BRIEF PATIENT DESCRIPTION: 73 y/o man with CAD, developed hypertension w/ flash pulmonary edema, severe hypoxia/hypercarbia.  SIGNIFICANT EVENTS / STUDIES:  Cardaic cath Intubation VT - without pulselessness  LINES / TUBES: ETT 8/28>>> R radial sheath 8/28>>>8/30  CULTURES: Blood 8/29>>> Urine 8/29>>> Sputum 8/29>>>  ANTIBIOTICS: Rocephin 8/29>>> Zithromax 8/29>>>  SUBJECTIVE: Hypotensive but significantly more arousable.  VITAL SIGNS: Temp:  [98.1 F (36.7 C)-102 F (38.9 C)] 99.7 F (37.6 C) (08/30 0800) Pulse Rate:  [57-150] 57 (08/30 0942) Resp:  [14-29] 15 (08/30 0942) BP: (105-157)/(51-84) 145/62 mmHg (08/30 0942) SpO2:  [99 %-100 %] 100 % (08/30 0942) Arterial Line BP: (85-161)/(47-82) 107/56 mmHg (08/30 0800) FiO2 (%):  [50 %-90 %] 50 % (08/30 0942) Weight:  [143 lb 8.3 oz (65.1 kg)] 143 lb 8.3 oz (65.1 kg) (08/30 0330)  HEMODYNAMICS: CVP:  [6 mmHg-15 mmHg] 6 mmHg  VENTILATOR SETTINGS: Vent Mode:  [-] PCV FiO2 (%):  [50 %-90 %] 50 % Set Rate:  [15 bmp] 15 bmp PEEP:  [10 cmH20-15 cmH20] 10 cmH20 Plateau Pressure:  [26 cmH20-37 cmH20] 28 cmH20  INTAKE / OUTPUT: Intake/Output     08/29 0701 - 08/30 0700 08/30 0701 - 08/31 0700   I.V. (mL/kg) 595.7 (9.2) 51.2 (0.8)   NG/GT 890 100   IV Piggyback 350    Total Intake(mL/kg) 1835.7 (28.2) 151.2 (2.3)   Urine (mL/kg/hr) 1830 (1.2) 75 (0.4)   Total Output 1830 75   Net +5.7 +76.2         PHYSICAL EXAMINATION: General:  Intubated, appears stated age Neuro:  Sedated and intubated. HEENT:  ETT in good position. Neck: No JVD. Cardiovascular:  Normal rate, regular rhythm. Lungs:  Rales heard over mechanical breath sounds. Abdomen:   Soft, NT, ND and +BS. Musculoskeletal:  No inflammatory arthritidis noted. Skin:  Intact, no rash.  LABS:  CBC  Recent Labs Lab 05/08/14 0105 05/09/14 0358  WBC 8.0 10.5  HGB 14.7 11.8*  HCT 45.9 35.6*  PLT 112* 106*   Coag's  Recent Labs Lab 05/08/14 0105  APTT 26  INR 1.18   BMET  Recent Labs Lab 05/08/14 0105 05/08/14 1530 05/09/14 0358  NA 147 143 143  K 3.5* 3.8 3.3*  CL 104 105 103  CO2 BUN CREATININE 1.01 0.95 1.07  GLUCOSE 173* 89 131*   Electrolytes  Recent Labs Lab 05/08/14 0105 05/08/14 1530 05/09/14 0358  CALCIUM 9.1 7.7* 7.8*  MG  --  1.5 2.7*  PHOS  --   --  3.3   Sepsis Markers  Recent Labs Lab 05/08/14 1530 05/09/14 0358  PROCALCITON 2.41 2.39    ABG  Recent Labs Lab 05/08/14 1630 05/08/14 1958 05/09/14 0944  PHART 7.404 7.394 7.376  PCO2ART 40.0 42.5 46.1*  PO2ART 199.0* 114.0* 109.0*   Liver Enzymes No results found for this basename: AST, ALT, ALKPHOS, BILITOT, ALBUMIN,  in the last 168 hours  Cardiac Enzymes  Recent Labs Lab 05/08/14 0105  TROPONINI <0.30   Glucose  Recent Labs Lab 05/08/14 0310 05/08/14 1139 05/08/14 1956 05/09/14 0037 05/09/14 0354 05/09/14 0821  GLUCAP 199* 73 131* 118* 131* 117*   Imaging Ct Head Wo Contrast  05/08/2014   CLINICAL DATA:  Seizure activity.  EXAM: CT HEAD WITHOUT CONTRAST  TECHNIQUE: Contiguous axial images were obtained from the base of the skull through the vertex without intravenous contrast.  COMPARISON:  None.  FINDINGS: Mild diffuse cerebral atrophy. Low-attenuation changes in the deep white matter consistent with small vessel ischemia. No mass effect or midline shift. No abnormal extra-axial fluid collections. Gray-white matter junctions are distinct. Basal cisterns are not effaced. No evidence of acute intracranial hemorrhage. No depressed skull fractures. Retention cysts in the left maxillary antrum. Mucosal thickening in the ethmoid air  cells. Mastoid air cells are not opacified. Vascular calcifications.  IMPRESSION: No acute intracranial abnormality.  Mild chronic atrophy.   Electronically Signed   By: Burman Nieves M.D.   On: 05/08/2014 04:41   Dg Chest Portable 1 View  05/08/2014   CLINICAL DATA:  Chest pain.  EXAM: PORTABLE CHEST - 1 VIEW  COMPARISON:  None.  FINDINGS: The heart is mildly enlarged. The patient is intubated. The endotracheal tube terminates 5 cm above the carina. An NG tube courses off the inferior border of the film. Compared where pads in place. Diffuse interstitial and airspace disease is noted, most prominent in the right middle lobe. The visualized soft tissues and bony thorax are otherwise unremarkable.  IMPRESSION: 1. The patient is intubated. 2. Diffuse interstitial and airspace disease may represent infection or edema. 3. Mild cardiomegaly. 4. Defibrillator pads are in place.   Electronically Signed   By: Gennette Pac M.D.   On: 05/08/2014 01:43   Dg Chest Port 1v Same Day  05/08/2014   CLINICAL DATA:  Status post central line placement  EXAM: PORTABLE CHEST - 1 VIEW SAME DAY  COMPARISON:  05/08/2014  FINDINGS: A right subclavian central line is now seen with the catheter tip in the mid superior vena cava. No pneumothorax is noted. Cardiac shadow is stable. Endotracheal tube is again noted approximately 8 cm above the carina. A nasogastric catheter is seen within the stomach diffuse interstitial and airspace changes are again seen. No new focal abnormality is noted.  IMPRESSION: Stable changes of the chest.  New right central venous line without pneumothorax.   Electronically Signed   By: Alcide Clever M.D.   On: 05/08/2014 13:53   EKG: LBB with ST elevations seen in anterior leads. CXR: bilateral patchy airspace disease sparing the periphery, most dense in R middle lobe.  ASSESSMENT / PLAN:  PULMONARY A: Severe Cardiogenic Pulmonary Edema Severe Hypoxic Respiratory failure P:   Decrease PEEP to 10  and FiO2 to 10. F/U ABG today noted. CXR and ABG in AM. Will need diureses once BP allows.  CARDIOVASCULAR A: Hypertensive Emergency on presentation now hypotensive CAD Systolic HF  P:   Placed TLC. Continue levophed. Follow CVP. Hold all anti-HTN. D/C diureses for now Will d/c propofol and start fentanyl/versed and hopefully that will decrease hemodynamic compromise.  RENAL A: Normal renal function on labs, good UOP Hypokalemia P:   BMP in AM. Replace electrolytes as indicated. Hold further diureses while hypotensive.  GASTROINTESTINAL A: No acute issues. P:   TF per nutrition.  HEMATOLOGIC A: Thrombocytopenia P:   Unclear etiology; will trend.  INFECTIOUS A: Febrile overnight, ? CAP P:   F/U on pan culture. Rocephin/zithromax started 8/29.  ENDOCRINE A: Critical Care Glycemic Goals P:   ICU glucose management protocol.  NEUROLOGIC A: Acute Encephalopathy  P:    D/C propofol. Fentanyl drip. Versed PRN. Maintain sedated while profoundly hypoxemic.  TODAY'S SUMMARY: Change sedation as  above, hold diureses, titrate PEEP and FiO2 down but no extubation today.  I have personally obtained a history, examined the patient, evaluated laboratory and imaging results, formulated the assessment and plan and placed orders.  CRITICAL CARE: The patient is critically ill with multiple organ systems failure and requires high complexity decision making for assessment and support, frequent evaluation and titration of therapies, application of advanced monitoring technologies and extensive interpretation of multiple databases. Critical Care Time devoted to patient care services described in this note is 35 minutes.   Alyson Reedy, M.D. [facility] Pulmonary/Critical Care Medicine. Pager: [phone]. After hours pager: [phone].  05/09/2014, 9:57 AM

## 2014-05-09 NOTE — Progress Notes (Addendum)
SUBJECTIVE:  Awake but intubated  OBJECTIVE:   Vitals:   Filed Vitals:   05/09/14 0400 05/09/14 0500 05/09/14 0600 05/09/14 0700  BP: 108/59  154/63   Pulse: 73 65 66 67  Temp: 99.1 F (37.3 C) 98.6 F (37 C) 98.8 F (37.1 C) 99.3 F (37.4 C)  TempSrc:      Resp: 15 15 17 15   Height:      Weight:      SpO2: 100% 100% 100% 100%   I&O's:   Intake/Output Summary (Last 24 hours) at 05/09/14 0803 Last data filed at 05/09/14 0700  Gross per 24 hour  Intake 1814.48 ml  Output   1430 ml  Net 384.48 ml   TELEMETRY: Reviewed telemetry pt in NSR:     PHYSICAL EXAM General: Well developed, well nourished, in no acute distress Head: Eyes PERRLA, No xanthomas.   Normal cephalic and atramatic  Lungs:  Diffuse expiratory wheezes Heart:   HRRR S1 S2 Pulses are 2+ & equal. Abdomen: Bowel sounds are positive, abdomen soft and non-tender without masses Extremities:   No clubbing, cyanosis or edema.  DP +1 Neuro: Alert and oriented X 3. Psych:  Good affect, responds appropriately   LABS: Basic Metabolic Panel:  Recent Labs  63/78/58 1530 05/09/14 0358  NA 143 143  K 3.8 3.3*  CL 105 103  CO2 23 25  GLUCOSE 89 131*  BUN 13 18  CREATININE 0.95 1.07  CALCIUM 7.7* 7.8*  MG 1.5 2.7*  PHOS  --  3.3   Liver Function Tests: No results found for this basename: AST, ALT, ALKPHOS, BILITOT, PROT, ALBUMIN,  in the last 72 hours No results found for this basename: LIPASE, AMYLASE,  in the last 72 hours CBC:  Recent Labs  05/08/14 0105 05/09/14 0358  WBC 8.0 10.5  NEUTROABS 2.6  --   HGB 14.7 11.8*  HCT 45.9 35.6*  MCV 90.4 85.6  PLT 112* 106*   Cardiac Enzymes:  Recent Labs  05/08/14 0105  TROPONINI <0.30   BNP: No components found with this basename: POCBNP,  D-Dimer: No results found for this basename: DDIMER,  in the last 72 hours Hemoglobin A1C: No results found for this basename: HGBA1C,  in the last 72 hours Fasting Lipid Panel:  Recent Labs   05/08/14 0105  TRIG 71   Thyroid Function Tests: No results found for this basename: TSH, T4TOTAL, FREET3, T3FREE, THYROIDAB,  in the last 72 hours Anemia Panel: No results found for this basename: VITAMINB12, FOLATE, FERRITIN, TIBC, IRON, RETICCTPCT,  in the last 72 hours Coag Panel:   Lab Results  Component Value Date   INR 1.18 05/08/2014    RADIOLOGY: Ct Head Wo Contrast  05/08/2014   CLINICAL DATA:  Seizure activity.  EXAM: CT HEAD WITHOUT CONTRAST  TECHNIQUE: Contiguous axial images were obtained from the base of the skull through the vertex without intravenous contrast.  COMPARISON:  None.  FINDINGS: Mild diffuse cerebral atrophy. Low-attenuation changes in the deep white matter consistent with small vessel ischemia. No mass effect or midline shift. No abnormal extra-axial fluid collections. Gray-white matter junctions are distinct. Basal cisterns are not effaced. No evidence of acute intracranial hemorrhage. No depressed skull fractures. Retention cysts in the left maxillary antrum. Mucosal thickening in the ethmoid air cells. Mastoid air cells are not opacified. Vascular calcifications.  IMPRESSION: No acute intracranial abnormality.  Mild chronic atrophy.   Electronically Signed   By: Burman Nieves M.D.   On: 05/08/2014  04:41   Dg Chest Portable 1 View  05/08/2014   CLINICAL DATA:  Chest pain.  EXAM: PORTABLE CHEST - 1 VIEW  COMPARISON:  None.  FINDINGS: The heart is mildly enlarged. The patient is intubated. The endotracheal tube terminates 5 cm above the carina. An NG tube courses off the inferior border of the film. Compared where pads in place. Diffuse interstitial and airspace disease is noted, most prominent in the right middle lobe. The visualized soft tissues and bony thorax are otherwise unremarkable.  IMPRESSION: 1. The patient is intubated. 2. Diffuse interstitial and airspace disease may represent infection or edema. 3. Mild cardiomegaly. 4. Defibrillator pads are in place.    Electronically Signed   By: Gennette Pac M.D.   On: 05/08/2014 01:43   Dg Chest Port 1v Same Day  05/08/2014   CLINICAL DATA:  Status post central line placement  EXAM: PORTABLE CHEST - 1 VIEW SAME DAY  COMPARISON:  05/08/2014  FINDINGS: A right subclavian central line is now seen with the catheter tip in the mid superior vena cava. No pneumothorax is noted. Cardiac shadow is stable. Endotracheal tube is again noted approximately 8 cm above the carina. A nasogastric catheter is seen within the stomach diffuse interstitial and airspace changes are again seen. No new focal abnormality is noted.  IMPRESSION: Stable changes of the chest.  New right central venous line without pneumothorax.   Electronically Signed   By: Alcide Clever M.D.   On: 05/08/2014 13:53    ASSESSMENT:  1. Acute hypoxic and hypercanic respiratory failure with respiratory arrest secondary to acute pulmonary edema. Still with high O2 requirement but able to decrease FIO2 to 70%.  He has diffuse expiratory wheezes on exam today. 2. Ventricular tachycardia with no reoccurence  3. ASCAD s/p emergent cath showing nonobstructive disease of the LAD, severe diagonal stenosis, total occlusion of the first OM and patent RCA stent with severe LF dysfunction and normal LVEDP - coronary anatomy unchanged from prior cath  4. Acute pulmonary edema secondary to hypertensive emergency - he is 1.5L negative and CVP is 6  5. 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 Thrombocytopenia ? Etiology  9. Hypokalemia - repleting 10.  Fever with WBC still normal but trending up.  Cultures pending  PLAN:  1. Continue ASA/Plavix/statin  2.  No BB at this time since still with borderline low BP on low dose levophed 3. Follow platelet count closely with  restarting ASA and Plavix  4. Check chest xray today. 5. Continue antibx coverage per CCM 6. Check CBC in am 7. Hold diuretics due to borderline low BP and low CVP 8. Check BNP   A total of 30 minutes critical care time spent in direct patient care    Quintella Reichert, MD  05/09/2014  8:03 AM

## 2014-05-09 NOTE — Progress Notes (Signed)
[facility] ADULT ICU REPLACEMENT PROTOCOL FOR AM LAB REPLACEMENT ONLY  The patient does not apply for the [facility] Adult ICU Electrolyte Replacment Protocol based on the criteria listed below:   1. Is GFR >/= 40 ml/min? Yes.    Patient's GFR today is 68 2. Is urine output >/= 0.5 ml/kg/hr for the last 6 hours? No. Patient's UOP is 0.4 ml/kg/hr 3. Is BUN < 60 mg/dL? Yes.    Patient's BUN today is 18 4. Abnormal electrolyte(s): K+3.3 5. Ordered repletion with: NA 6. If a panic level lab has been reported, has the CCM MD in charge been notified? Yes.  .   Physician:  E Deterding  Cathlean Cower [facility] 05/09/2014 5:56 AM

## 2014-05-09 NOTE — Progress Notes (Signed)
eLink Physician-Brief Progress Note Patient Name: [name] DOB: Feb 07, 1941 MRN: [id number]   Date of Service  05/09/2014  HPI/Events of Note  Hypokalemia  eICU Interventions  Potassium replaced     Intervention Category Minor Interventions: Electrolytes abnormality - evaluation and management  DETERDING,ELIZABETH 05/09/2014, 5:59 AM

## 2014-05-10 ENCOUNTER — Inpatient Hospital Stay (HOSPITAL_COMMUNITY): Payer: Medicare Other

## 2014-05-10 ENCOUNTER — Encounter (HOSPITAL_COMMUNITY): Payer: Self-pay

## 2014-05-10 DIAGNOSIS — J9600 Acute respiratory failure, unspecified whether with hypoxia or hypercapnia: Secondary | ICD-10-CM

## 2014-05-10 DIAGNOSIS — I2589 Other forms of chronic ischemic heart disease: Secondary | ICD-10-CM

## 2014-05-10 DIAGNOSIS — I10 Essential (primary) hypertension: Secondary | ICD-10-CM

## 2014-05-10 DIAGNOSIS — J811 Chronic pulmonary edema: Secondary | ICD-10-CM

## 2014-05-10 DIAGNOSIS — J810 Acute pulmonary edema: Secondary | ICD-10-CM

## 2014-05-10 HISTORY — DX: Chronic pulmonary edema: J81.1

## 2014-05-10 LAB — BASIC METABOLIC PANEL
Anion gap: 8 (ref 5–15)
BUN: 12 mg/dL (ref 6–23)
CO2: 27 mEq/L (ref 19–32)
Calcium: 8.1 mg/dL — ABNORMAL LOW (ref 8.4–10.5)
Chloride: 107 mEq/L (ref 96–112)
Creatinine, Ser: 0.7 mg/dL (ref 0.50–1.35)
Glucose, Bld: 102 mg/dL — ABNORMAL HIGH (ref 70–99)
Potassium: 4.1 mEq/L (ref 3.7–5.3)
Sodium: 142 mEq/L (ref 137–147)

## 2014-05-10 LAB — MAGNESIUM: MAGNESIUM: 2.1 mg/dL (ref 1.5–2.5)

## 2014-05-10 LAB — CBC
HEMATOCRIT: 33.3 % — AB (ref 39.0–52.0)
HEMOGLOBIN: 10.8 g/dL — AB (ref 13.0–17.0)
MCH: 28.3 pg (ref 26.0–34.0)
MCHC: 32.4 g/dL (ref 30.0–36.0)
MCV: 87.2 fL (ref 78.0–100.0)
Platelets: 93 10*3/uL — ABNORMAL LOW (ref 150–400)
RBC: 3.82 MIL/uL — ABNORMAL LOW (ref 4.22–5.81)
RDW: 14.4 % (ref 11.5–15.5)
WBC: 7.6 10*3/uL (ref 4.0–10.5)

## 2014-05-10 LAB — GLUCOSE, CAPILLARY
Glucose-Capillary: 106 mg/dL — ABNORMAL HIGH (ref 70–99)
Glucose-Capillary: 143 mg/dL — ABNORMAL HIGH (ref 70–99)
Glucose-Capillary: 134 mg/dL — ABNORMAL HIGH (ref 70–99)

## 2014-05-10 LAB — POCT I-STAT 3, ART BLOOD GAS (G3+)
Acid-base deficit: 4 mmol/L — ABNORMAL HIGH (ref 0.0–2.0)
Bicarbonate: 27.6 mEq/L — ABNORMAL HIGH (ref 20.0–24.0)
O2 SAT: 72 %
TCO2: 30 mmol/L (ref 0–100)
pCO2 arterial: 89 mmHg (ref 35.0–45.0)
pH, Arterial: 7.099 — CL (ref 7.350–7.450)
pO2, Arterial: 53 mmHg — ABNORMAL LOW (ref 80.0–100.0)

## 2014-05-10 LAB — URINE CULTURE
Colony Count: NO GROWTH
Culture: NO GROWTH
Special Requests: NORMAL

## 2014-05-10 LAB — PROCALCITONIN: Procalcitonin: 0.72 ng/mL

## 2014-05-10 LAB — PHOSPHORUS: Phosphorus: 2.2 mg/dL — ABNORMAL LOW (ref 2.3–4.6)

## 2014-05-10 MED ORDER — NALOXONE HCL 0.4 MG/ML IJ SOLN
INTRAMUSCULAR | Status: AC
  Administered 2014-05-10: 0.2 mg via INTRAVENOUS
  Filled 2014-05-10: qty 1

## 2014-05-10 MED ORDER — MIDAZOLAM HCL 2 MG/2ML IJ SOLN
1.0000 mg | INTRAMUSCULAR | Status: DC | PRN
  Administered 2014-05-10 – 2014-05-11 (×6): 2 mg via INTRAVENOUS
  Filled 2014-05-10 (×5): qty 2

## 2014-05-10 MED ORDER — NITROGLYCERIN 0.3 MG/HR TD PT24
0.3000 mg | MEDICATED_PATCH | Freq: Every day | TRANSDERMAL | Status: DC
  Administered 2014-05-11 – 2014-05-18 (×8): 0.3 mg via TRANSDERMAL
  Filled 2014-05-10 (×9): qty 1

## 2014-05-10 MED ORDER — HYDRALAZINE HCL 10 MG PO TABS
10.0000 mg | ORAL_TABLET | Freq: Four times a day (QID) | ORAL | Status: DC
  Filled 2014-05-10 (×4): qty 1

## 2014-05-10 MED ORDER — ATORVASTATIN CALCIUM 10 MG PO TABS
10.0000 mg | ORAL_TABLET | Freq: Every day | ORAL | Status: DC
  Administered 2014-05-10: 10 mg
  Filled 2014-05-10 (×2): qty 1

## 2014-05-10 MED ORDER — NOREPINEPHRINE BITARTRATE 1 MG/ML IV SOLN
2.0000 ug/min | INTRAVENOUS | Status: DC
  Filled 2014-05-10: qty 4

## 2014-05-10 MED ORDER — SODIUM CHLORIDE 0.9 % IJ SOLN
10.0000 mL | INTRAMUSCULAR | Status: DC | PRN
  Administered 2014-05-10 – 2014-05-15 (×2): 10 mL
  Administered 2014-05-15 (×2): 30 mL
  Administered 2014-05-16 (×3): 10 mL

## 2014-05-10 MED ORDER — FUROSEMIDE 10 MG/ML IJ SOLN
INTRAMUSCULAR | Status: AC
  Administered 2014-05-10: 40 mg
  Filled 2014-05-10: qty 4

## 2014-05-10 MED ORDER — HYDRALAZINE HCL 20 MG/ML IJ SOLN
10.0000 mg | INTRAMUSCULAR | Status: DC | PRN
  Administered 2014-05-10 – 2014-05-11 (×4): 10 mg via INTRAVENOUS
  Administered 2014-05-11 – 2014-05-12 (×3): 20 mg via INTRAVENOUS
  Administered 2014-05-13: 40 mg via INTRAVENOUS
  Filled 2014-05-10 (×5): qty 1
  Filled 2014-05-10: qty 2
  Filled 2014-05-10: qty 1

## 2014-05-10 MED ORDER — DEXTROSE 5 % IV SOLN
2.0000 ug/min | INTRAVENOUS | Status: DC
  Administered 2014-05-10: 4 ug/min via INTRAVENOUS

## 2014-05-10 MED ORDER — DEXMEDETOMIDINE BOLUS VIA INFUSION
1.0000 ug/kg | Freq: Once | INTRAVENOUS | Status: AC
  Administered 2014-05-10: 64.2 ug via INTRAVENOUS
  Filled 2014-05-10: qty 65

## 2014-05-10 MED ORDER — ASPIRIN 81 MG PO CHEW
81.0000 mg | CHEWABLE_TABLET | Freq: Every day | ORAL | Status: DC
  Administered 2014-05-10: 81 mg via ORAL
  Filled 2014-05-10: qty 1

## 2014-05-10 MED ORDER — IPRATROPIUM-ALBUTEROL 0.5-2.5 (3) MG/3ML IN SOLN
3.0000 mL | Freq: Four times a day (QID) | RESPIRATORY_TRACT | Status: DC
  Administered 2014-05-10 – 2014-05-16 (×24): 3 mL via RESPIRATORY_TRACT
  Filled 2014-05-10 (×25): qty 3

## 2014-05-10 MED ORDER — VITAL AF 1.2 CAL PO LIQD
1000.0000 mL | ORAL | Status: DC
  Filled 2014-05-10 (×3): qty 1000

## 2014-05-10 MED ORDER — ATORVASTATIN CALCIUM 10 MG PO TABS: 10.0000 mg | ORAL_TABLET | Freq: Every day | ORAL | Status: DC

## 2014-05-10 MED ORDER — NALOXONE HCL 0.4 MG/ML IJ SOLN
0.4000 mg | INTRAMUSCULAR | Status: DC | PRN
  Administered 2014-05-10: 0.2 mg via INTRAVENOUS

## 2014-05-10 MED ORDER — ASPIRIN 81 MG PO CHEW
81.0000 mg | CHEWABLE_TABLET | Freq: Every day | ORAL | Status: DC
  Administered 2014-05-11: 81 mg
  Filled 2014-05-10: qty 1

## 2014-05-10 MED ORDER — SODIUM CHLORIDE 0.9 % IJ SOLN
10.0000 mL | Freq: Two times a day (BID) | INTRAMUSCULAR | Status: DC
  Administered 2014-05-10 – 2014-05-18 (×11): 10 mL

## 2014-05-10 MED ORDER — ENOXAPARIN SODIUM 40 MG/0.4ML ~~LOC~~ SOLN
40.0000 mg | SUBCUTANEOUS | Status: DC
  Administered 2014-05-10: 40 mg via SUBCUTANEOUS
  Filled 2014-05-10: qty 0.4

## 2014-05-10 MED ORDER — MIDAZOLAM HCL 2 MG/2ML IJ SOLN
INTRAMUSCULAR | Status: AC
  Administered 2014-05-10: 2 mg via INTRAVENOUS
  Filled 2014-05-10: qty 2

## 2014-05-10 MED ORDER — CLOPIDOGREL BISULFATE 75 MG PO TABS: 75.0000 mg | ORAL_TABLET | Freq: Every day | ORAL | Status: DC

## 2014-05-10 MED ORDER — ACETAMINOPHEN 160 MG/5ML PO SOLN
650.0000 mg | ORAL | Status: DC | PRN
  Administered 2014-05-10: 650 mg
  Filled 2014-05-10: qty 20.3

## 2014-05-10 MED ORDER — FENTANYL CITRATE 0.05 MG/ML IJ SOLN
25.0000 ug | INTRAMUSCULAR | Status: DC | PRN
  Administered 2014-05-10 – 2014-05-11 (×3): 100 ug via INTRAVENOUS
  Filled 2014-05-10 (×3): qty 2

## 2014-05-10 MED ORDER — CLOPIDOGREL BISULFATE 75 MG PO TABS
75.0000 mg | ORAL_TABLET | Freq: Every day | ORAL | Status: DC
  Administered 2014-05-11: 75 mg
  Filled 2014-05-10: qty 1

## 2014-05-10 MED ORDER — HYDRALAZINE HCL 20 MG/ML IJ SOLN
INTRAMUSCULAR | Status: AC
  Administered 2014-05-10: 10 mg via INTRAVENOUS
  Filled 2014-05-10: qty 1

## 2014-05-10 MED ORDER — HYDRALAZINE HCL 20 MG/ML IJ SOLN
10.0000 mg | Freq: Four times a day (QID) | INTRAMUSCULAR | Status: DC
  Administered 2014-05-10: 10 mg via INTRAVENOUS

## 2014-05-10 MED ORDER — DEXMEDETOMIDINE HCL IN NACL 200 MCG/50ML IV SOLN
0.4000 ug/kg/h | INTRAVENOUS | Status: DC
  Administered 2014-05-10 (×3): 0.4 ug/kg/h via INTRAVENOUS
  Administered 2014-05-11: 0.7 ug/kg/h via INTRAVENOUS
  Administered 2014-05-11: 0.5 ug/kg/h via INTRAVENOUS
  Administered 2014-05-11: 0.7 ug/kg/h via INTRAVENOUS
  Filled 2014-05-10 (×6): qty 50

## 2014-05-10 MED ORDER — FAMOTIDINE 40 MG/5ML PO SUSR
20.0000 mg | Freq: Two times a day (BID) | ORAL | Status: DC
  Administered 2014-05-10 – 2014-05-11 (×2): 20 mg
  Filled 2014-05-10 (×4): qty 2.5

## 2014-05-10 NOTE — Clinical Documentation Improvement (Signed)
Presents with acute hypoxic respiratory failure and acute pulmonary edema. Patient is vented.   Patient with hypotension; 76/54  Levophed drip initiated  Please provide a diagnosis associated with the above clinical indicators and document findings in next progress note and carry to discharge summary.   Cardiogenic Shock Hypovolemic Shock Other Condition   Thank You, Shellee Milo ,RN Clinical Documentation Specialist:  [phone]  [facility]- Health Information Management

## 2014-05-10 NOTE — Progress Notes (Signed)
PULMONARY  / CRITICAL CARE MEDICINE CONSULTATION   Name: [name] MRN: [id number] DOB: Mar 21, 1941    ADMISSION DATE:  05/08/2014  REQUESTING CLINICIAN: Excell Seltzer PRIMARY SERVICE: Cardiology  CHIEF COMPLAINT:  Respiratory Arrest  BRIEF PATIENT DESCRIPTION: 3 M with CAD presented to ED 8/29 with acute resp distress/failure requiring intubation and due to severe hypertension-induced pulmonary edema. Had brief VT in ED. Admitted by Cardiology and PCCM consulting  SIGNIFICANT EVENTS / STUDIES:  8/29 Cardiac cath: Coronary artery disease with nonobstructive LAD stenosis, severe diagonal stenosis, total occlusion of the first OM branch, and patency of the RCA stented segment. LVEF 35%. Severe segmental LV systolic dysfunction 8/31 Initially RASS -4 and hypopneic on SBT. Fentanyl infusion stopped. Pt became agitated but initially passed SBT. Then became severely hypertensive with increasing agitation and worsening hypoxemia. Placed back on full vent support and sedated again. Received hydralazine and furosemide. Subsequently was hypotensive with resumption of norepi @ low dose. Dexmedetomidine initiated  LINES / TUBES: ETT 8/29 >>  L radial art line 8/28 >>  R [place] CVL 8/29 >>    CULTURES: Urine 8/29 >> NEG Resp  8/29 >>  Blood 8/29 >>    ANTIBIOTICS: Rocephin 8/29 >> 8/31 Zithromax 8/29 >> 8/31  SUBJECTIVE:  RA  VITAL SIGNS: Temp:  [98.4 F (36.9 C)-99.7 F (37.6 C)] 99.7 F (37.6 C) (08/31 1330) Pulse Rate:  [55-129] 77 (08/31 1330) Resp:  [11-27] 17 (08/31 1330) BP: (105-195)/(49-120) 105/51 mmHg (08/31 1200) SpO2:  [82 %-100 %] 99 % (08/31 1330) Arterial Line BP: (75-221)/(41-107) 135/54 mmHg (08/31 1330) FiO2 (%):  [40 %-70 %] 70 % (08/31 1200) Weight:  [64.2 kg (141 lb 8.6 oz)] 64.2 kg (141 lb 8.6 oz) (08/31 0500)  HEMODYNAMICS: CVP:  [5 mmHg-11 mmHg] 6 mmHg  VENTILATOR SETTINGS: Vent Mode:  [-] PRVC FiO2 (%):  [40 %-70 %] 70 % Set Rate:  [15 bmp] 15 bmp Vt Set:   [500 mL] 500 mL PEEP:  [5 cmH20-10 cmH20] 5 cmH20 Plateau Pressure:  [12 cmH20-26 cmH20] 12 cmH20  INTAKE / OUTPUT: Intake/Output     08/30 0701 - 08/31 0700 08/31 0701 - 09/01 0700   I.V. (mL/kg) 980.1 (15.3) 108.1 (1.7)   NG/GT 1340 330   IV Piggyback 300    Total Intake(mL/kg) 2620.1 (40.8) 438.1 (6.8)   Urine (mL/kg/hr) 700 (0.5) 735 (1.6)   Total Output 700 735   Net +1920.1 -296.9         PHYSICAL EXAMINATION: General: RASS -4 to +3. + F/C Neuro:  No focal deficits HEENT:  WNL, poor oral hygiene Neck: JVP not well visualized Cardiovascular: Tachy, hyperdynamic, no M noted Lungs: B rhonchi Abdomen:  Soft, NT, ND and +BS. Ext: warm, no edema  LABS: I have reviewed all of today's lab results. Relevant abnormalities are discussed in the A/P section  CXR: CM, vasc congestion, no discrete infiltrates  ASSESSMENT / PLAN:  PULMONARY A: Acute hypoxic resp failure Pulm edema P:   Cont full vent support - settings reviewed and/or adjusted Cont vent bundle Daily SBT if/when meets criteria.  CARDIOVASCULAR A: Cardiomyopathy (LVEF 35%) Severe hypertension - intermittent and related to agitation Non obstructive CAD Hypotension related to sedation P:   Try to avoid overcorrection of hemodynamic derangements NTG patch ordered 8/31 Wean Norepi to off for MAP > 60 mmHg  RENAL A: Hypokalemia, resolved P:   Monitor BMET intermittently Monitor I/Os Correct electrolytes as indicated  GASTROINTESTINAL A: No acute issues. P:   SUP:  enteral famotidine Cont TFs  HEMATOLOGIC A: Thrombocytopenia Mild anemia without acute blood loss P:   DVT px: SCDs Monitor CBC intermittently Transfuse per usual ICU guidelines  INFECTIOUS A: No overt evidence of acute infection P:   Micro and abx as above  ENDOCRINE A: Mild hyperglycemia No documented hx of DM P:   Monitor CBGs Consider SSI for glu > 180  NEUROLOGIC A: Acute Encephalopathy  Severe agitation P:     Dex gtt started 8/31 PRN midaz PRN fentanyl Daily WUA  TODAY'S SUMMARY:    I have personally obtained a history, examined the patient, evaluated laboratory and imaging results, formulated the assessment and plan and placed orders.  CRITICAL CARE: The patient is critically ill with multiple organ systems failure and requires high complexity decision making for assessment and support, frequent evaluation and titration of therapies, application of advanced monitoring technologies and extensive interpretation of multiple databases. Critical Care Time devoted to patient care services described in this note is 45 minutes.   Billy Fischer, MD ; [facility] [phone].  After 5:30 PM or weekends, call [phone]   05/10/2014, 2:01 PM

## 2014-05-10 NOTE — Progress Notes (Signed)
       Patient Name: [name] Date of Encounter: 05/10/2014    SUBJECTIVE: Intubated  TELEMETRY:  Normal sinus rhythm without significant ventricular ectopy Filed Vitals:   05/10/14 1100 05/10/14 1115 05/10/14 1130 05/10/14 1200  BP:    105/51  Pulse: 94 87 89   Temp: 99.3 F (37.4 C) 99.5 F (37.5 C) 99.5 F (37.5 C)   TempSrc:      Resp: 13 13 13    Height:      Weight:      SpO2: 89% 92% 96%     Intake/Output Summary (Last 24 hours) at 05/10/14 1333 Last data filed at 05/10/14 1100  Gross per 24 hour  Intake 2220.09 ml  Output    960 ml  Net 1260.09 ml   LABS: Basic Metabolic Panel:  Recent Labs  10/10/41 0358 05/10/14 0500  NA 143 142  K 3.3* 4.1  CL 103 107  CO2 25 27  GLUCOSE 131* 102*  BUN 18 12  CREATININE 1.07 0.70  CALCIUM 7.8* 8.1*  MG 2.7* 2.1  PHOS 3.3 2.2*   CBC:  Recent Labs  05/08/14 0105 05/09/14 0358 05/10/14 1000  WBC 8.0 10.5 7.6  NEUTROABS 2.6  --   --   HGB 14.7 11.8* 10.8*  HCT 45.9 35.6* 33.3*  MCV 90.4 85.6 87.2  PLT 112* 106* 93*   Cardiac Enzymes:  Recent Labs  05/08/14 0105  TROPONINI <0.30   BNP: No components found with this basename: POCBNP,  Hemoglobin A1C: No results found for this basename: HGBA1C,  in the last 72 hours Fasting Lipid Panel:  Recent Labs  05/08/14 0105  TRIG 71    Radiology/Studies:   Chest x-ray: 05/10/49  IMPRESSION:  Endotracheal tube tip 4.4 cm proximal to the carina.  Mild lung base opacities; atelectasis versus infiltrate.  Physical Exam: Blood pressure 105/51, pulse 89, temperature 99.5 F (37.5 C), temperature source Core (Comment), resp. rate 13, height 5\' 5"  (1.651 m), weight 141 lb 8.6 oz (64.2 kg), SpO2 96.00%. Weight change: -1 lb 15.7 oz (-0.9 kg)  Wt Readings from Last 3 Encounters:  05/10/14 141 lb 8.6 oz (64.2 kg)  05/10/14 141 lb 8.6 oz (64.2 kg)    Rhonchi present anteriorly No gallop or murmur No edema  ASSESSMENT:  1. Acute respiratory  failure with interstitial edema, possibly cardiogenic versus other. Overall LV function is normal. No acutely obstructed coronary lesions were documented a cath. This would therefore suggest acute diastolic heart failure 2. Initial severe hypertension, with transition to hypotension. Unable to find if any workup is been done for renal artery stenosis. 3. Coronary artery disease with diagonal and obtuse marginal occlusion. Maj. arteries are widely patent and unlikely sources of acute ischemia 4. No recurrence of ventricular tachycardia  Plan:  1. Overall fluid status is positive since admission. If this was truly pulmonary edema on the cardiogenic basis, diuresis will be needed at some point. Soft blood pressures have been a limiting factor. 2. Beta blocker therapy as tolerated once blood pressure is high enough to allow 3. Continue ventilatory support until lung injury heals 4. Will follow but have no specific recommendations at this time  Selinda Eon 05/10/2014, 1:33 PM

## 2014-05-10 NOTE — Progress Notes (Signed)
RT note: AM ABG done on ISTAT. Results not crossing over. Ph 7.42\ co2 45\ po2 69\ hCO3 29.8 sat 94%.

## 2014-05-10 NOTE — Clinical Documentation Improvement (Signed)
Presents with acute respiratory failure, acute pulmonary edema, malignant hypertension.   Patient is being treated with IV Rocephin and Zithromax  Please provide a diagnosis associated with the above treatment plan and document findings in next progress note and include in discharge summary.   Thank You, Shellee Milo ,RN Clinical Documentation Specialist:  [phone]  [facility]- Health Information Management

## 2014-05-10 NOTE — Progress Notes (Signed)
Fentanyl gtt turned off for 1.5 hours. Dr. Sung Amabile at bedside and placed patient on pressure support on the vent. Pt became very anxious and agitated. Pt began coughing uncontrollably and had copious amounts of blood tinged secretions. Pt SBP increased to 190-200's. Pt sats dropped to mid 80's. Weaning measures aborted at this time. Pt was placed back on previous vent settings. Dr. Sung Amabile remained at bedside and aware. Will continue to monitor pt closely.

## 2014-05-11 ENCOUNTER — Inpatient Hospital Stay (HOSPITAL_COMMUNITY): Payer: Medicare Other

## 2014-05-11 LAB — CULTURE, RESPIRATORY

## 2014-05-11 LAB — GLUCOSE, CAPILLARY
GLUCOSE-CAPILLARY: 123 mg/dL — AB (ref 70–99)
GLUCOSE-CAPILLARY: 95 mg/dL (ref 70–99)
Glucose-Capillary: 123 mg/dL — ABNORMAL HIGH (ref 70–99)
Glucose-Capillary: 128 mg/dL — ABNORMAL HIGH (ref 70–99)
Glucose-Capillary: 141 mg/dL — ABNORMAL HIGH (ref 70–99)

## 2014-05-11 LAB — CBC
HCT: 30.6 % — ABNORMAL LOW (ref 39.0–52.0)
HEMOGLOBIN: 9.8 g/dL — AB (ref 13.0–17.0)
MCH: 28.2 pg (ref 26.0–34.0)
MCHC: 32 g/dL (ref 30.0–36.0)
MCV: 87.9 fL (ref 78.0–100.0)
PLATELETS: 97 10*3/uL — AB (ref 150–400)
RBC: 3.48 MIL/uL — AB (ref 4.22–5.81)
RDW: 14.6 % (ref 11.5–15.5)
WBC: 6.1 10*3/uL (ref 4.0–10.5)

## 2014-05-11 LAB — COMPREHENSIVE METABOLIC PANEL
ALT: 13 U/L (ref 0–53)
AST: 20 U/L (ref 0–37)
Albumin: 2.7 g/dL — ABNORMAL LOW (ref 3.5–5.2)
Alkaline Phosphatase: 54 U/L (ref 39–117)
Anion gap: 10 (ref 5–15)
BILIRUBIN TOTAL: 0.7 mg/dL (ref 0.3–1.2)
BUN: 18 mg/dL (ref 6–23)
CALCIUM: 8.6 mg/dL (ref 8.4–10.5)
CHLORIDE: 106 meq/L (ref 96–112)
CO2: 27 meq/L (ref 19–32)
Creatinine, Ser: 0.73 mg/dL (ref 0.50–1.35)
GLUCOSE: 128 mg/dL — AB (ref 70–99)
Potassium: 4.1 mEq/L (ref 3.7–5.3)
SODIUM: 143 meq/L (ref 137–147)
Total Protein: 6 g/dL (ref 6.0–8.3)

## 2014-05-11 LAB — POCT I-STAT 3, ART BLOOD GAS (G3+)
ACID-BASE EXCESS: 5 mmol/L — AB (ref 0.0–2.0)
BICARBONATE: 29.8 meq/L — AB (ref 20.0–24.0)
O2 Saturation: 94 %
TCO2: 31 mmol/L (ref 0–100)
pCO2 arterial: 45.9 mmHg — ABNORMAL HIGH (ref 35.0–45.0)
pH, Arterial: 7.42 (ref 7.350–7.450)
pO2, Arterial: 69 mmHg — ABNORMAL LOW (ref 80.0–100.0)

## 2014-05-11 LAB — CULTURE, RESPIRATORY W GRAM STAIN

## 2014-05-11 MED ORDER — CLOPIDOGREL BISULFATE 75 MG PO TABS
75.0000 mg | ORAL_TABLET | Freq: Every day | ORAL | Status: DC
  Administered 2014-05-12 – 2014-05-18 (×7): 75 mg via ORAL
  Filled 2014-05-11 (×7): qty 1

## 2014-05-11 MED ORDER — ATORVASTATIN CALCIUM 10 MG PO TABS
10.0000 mg | ORAL_TABLET | Freq: Every day | ORAL | Status: DC
  Administered 2014-05-11 – 2014-05-18 (×8): 10 mg via ORAL
  Filled 2014-05-11 (×8): qty 1

## 2014-05-11 MED ORDER — ONDANSETRON HCL 4 MG/2ML IJ SOLN
4.0000 mg | Freq: Four times a day (QID) | INTRAMUSCULAR | Status: DC | PRN
  Administered 2014-05-11: 4 mg via INTRAVENOUS
  Filled 2014-05-11: qty 2

## 2014-05-11 MED ORDER — ACETAMINOPHEN 325 MG PO TABS
650.0000 mg | ORAL_TABLET | Freq: Four times a day (QID) | ORAL | Status: DC | PRN
  Administered 2014-05-11 – 2014-05-16 (×3): 650 mg via ORAL
  Filled 2014-05-11 (×3): qty 2

## 2014-05-11 MED ORDER — FENTANYL CITRATE 0.05 MG/ML IJ SOLN
12.5000 ug | INTRAMUSCULAR | Status: DC | PRN
  Administered 2014-05-11: 25 ug via INTRAVENOUS
  Administered 2014-05-12: 12.5 ug via INTRAVENOUS
  Filled 2014-05-11 (×2): qty 2

## 2014-05-11 MED ORDER — FUROSEMIDE 10 MG/ML IJ SOLN
40.0000 mg | Freq: Once | INTRAMUSCULAR | Status: AC
  Administered 2014-05-11: 40 mg via INTRAVENOUS
  Filled 2014-05-11: qty 4

## 2014-05-11 MED ORDER — ASPIRIN 81 MG PO CHEW
81.0000 mg | CHEWABLE_TABLET | Freq: Every day | ORAL | Status: DC
  Administered 2014-05-12 – 2014-05-18 (×7): 81 mg via ORAL
  Filled 2014-05-11 (×6): qty 1

## 2014-05-11 NOTE — Progress Notes (Signed)
Pt since being extubated was asking for his cell phones. I called his wife and she has both cell phones. I updated her on progress and she hopes that she will make it up here today. Pt has several clothes in bags and 2 wallets,some cash, cell phone case, lighter, and socks and shoes. Wife stated that she hopes to make it here today and she will pick that up. If not she and patient stated they would like to lock the important things up in security.

## 2014-05-11 NOTE — Care Management Note (Addendum)
  Page 2 of 2   05/18/2014     12:51:50 PM CARE MANAGEMENT NOTE 05/18/2014  Patient:  [name], [name]   Account Number:  [id number]  Date Initiated:  05/11/2014  Documentation initiated by:  Junius Creamer  Subjective/Objective Assessment:   adm w pul edema-vent     Action/Plan:   lives w wife, pcp dr v leschber   Anticipated DC Date:  05/16/2014   Anticipated DC Plan:  HOME W HOME HEALTH SERVICES      DC Planning Services  CM consult      [facility] Choice  HOME HEALTH   Choice offered to / List presented to:  C-1 Patient   DME arranged  Levan Hurst      DME agency  [facility]     [facility] arranged  HH-1 RN  HH-10 DISEASE MANAGEMENT  HH-2 PT      HH agency  [facility]   Status of service:  Completed, signed off Medicare Important Message given?  YES (If response is "NO", the following Medicare IM given date fields will be blank) Date Medicare IM given:  05/17/2014 Medicare IM given by:  Junius Creamer Date Additional Medicare IM given:  05/18/2014 Additional Medicare IM given by:  Darielle Hancher  Discharge Disposition:  HOME W HOME HEALTH SERVICES  Per UR Regulation:  Reviewed for med. necessity/level of care/duration of stay  If discussed at Long Length of Stay Meetings, dates discussed:   05/13/2014  05/18/2014    Comments:  Donato Schultz RN, BSN, MSHL, CCM  Nurse - Case Manager,  (Unit [phone]  05/18/2014 Patient c/o receiving more than one IM and states only one is necessary.  States he feels harassed and that he called the [employer]  1-800 # who instructed this is a MC issue and not r/t MCR.  CM provided education on reason for IM letter must be given 2 days prior to expected d/c which is difficult to assess.  CM instructed in appeals process if patient has issue with d/c.  CM advised patient he may file a c/o with MCR.  Instructed that [facility] follows MCR regulation d/t being a provider of MCR services.   CM reported patients complaint  too 3E Unit Director [facility]. PT RECS:  HH PT DME RECS:  RW (Patient already has RW in the home). [employer] while contemplating ICD. Disposition Plan:  home with HHS:  RN, PT [facility] notified)

## 2014-05-11 NOTE — Progress Notes (Signed)
       Patient Name: [name] Date of Encounter: 05/11/2014    SUBJECTIVE: Still intubated on rounds. He has subsequently been extubated at the time of this note. He is stable so far. He had no complaints of chest discomfort this morning is simply wanted to have the endotracheal tube taken out.  TELEMETRY:  Sinus rhythm with left bundle and occasional PVCs Filed Vitals:   05/11/14 1230 05/11/14 1245 05/11/14 1300 05/11/14 1315  BP: 171/48 149/40 149/36 151/114  Pulse: 87 93 85 90  Temp: 98.8 F (37.1 C) 99 F (37.2 C) 99 F (37.2 C) 99.1 F (37.3 C)  TempSrc:      Resp: 20 21 16 19   Height:      Weight:      SpO2: 96% 97% 96% 95%    Intake/Output Summary (Last 24 hours) at 05/11/14 1338 Last data filed at 05/11/14 1200  Gross per 24 hour  Intake 1494.3 ml  Output   1085 ml  Net  409.3 ml   LABS: Basic Metabolic Panel:  Recent Labs  97/74/14 0358 05/10/14 0500 05/11/14 0400  NA 143 142 143  K 3.3* 4.1 4.1  CL 103 107 106  CO2 25 27 27   GLUCOSE 131* 102* 128*  BUN 18 12 18   CREATININE 1.07 0.70 0.73  CALCIUM 7.8* 8.1* 8.6  MG 2.7* 2.1  --   PHOS 3.3 2.2*  --    CBC:  Recent Labs  05/10/14 1000 05/11/14 0400  WBC 7.6 6.1  HGB 10.8* 9.8*  HCT 33.3* 30.6*  MCV 87.2 87.9  PLT 93* 97*   BNP No results found for this basename: probnp    Radiology/Studies:  Chest x-ray 05/11/2014:  IMPRESSION:  Marked worsening of bibasilar airspace disease could be due to  atelectasis or pneumonia.    Physical Exam: Blood pressure 151/114, pulse 90, temperature 99.1 F (37.3 C), temperature source Core (Comment), resp. rate 19, height 5\' 5"  (1.651 m), weight 141 lb 8.6 oz (64.2 kg), SpO2 95.00%. Weight change:   Wt Readings from Last 3 Encounters:  05/10/14 141 lb 8.6 oz (64.2 kg)  05/10/14 141 lb 8.6 oz (64.2 kg)    Distant heart sounds  Rhonchi on auscultation of lungs. Patient is now extubated. No peripheral edema  ASSESSMENT:   1. Acute on  chronic combined systolic and diastolic heart failure, now tolerating extubation fairly well to this point.   2. Nonobstructive coronary disease  3. Long-standing hypertension  Plan:   Begin diuresis as tolerated.   Selinda Eon 05/11/2014, 1:38 PM

## 2014-05-11 NOTE — Progress Notes (Signed)
Aline d/c'd per md order. L radial "0" CDI.

## 2014-05-11 NOTE — Progress Notes (Signed)
PULMONARY  / CRITICAL CARE MEDICINE CONSULTATION   Name: [name] MRN: [id number] DOB: 1940/10/27    ADMISSION DATE:  05/08/2014  REQUESTING CLINICIAN: Excell Seltzer PRIMARY SERVICE: Cardiology  CHIEF COMPLAINT:  Respiratory Arrest  BRIEF PATIENT DESCRIPTION: 69 M with CAD presented to ED 8/29 with acute resp distress/failure requiring intubation and due to severe hypertension-induced pulmonary edema. Had brief VT in ED. Admitted by Cardiology and PCCM consulting  SIGNIFICANT EVENTS / STUDIES:  8/29 Cardiac cath: Coronary artery disease with nonobstructive LAD stenosis, severe diagonal stenosis, total occlusion of the first OM branch, and patency of the RCA stented segment. LVEF 35%. Severe segmental LV systolic dysfunction 8/31 Initially RASS -4 and hypopneic on SBT. Fentanyl infusion stopped. Pt became agitated but initially passed SBT. Then became severely hypertensive with increasing agitation and worsening hypoxemia. Placed back on full vent support and sedated again. Received hydralazine and furosemide. Subsequently was hypotensive with resumption of norepi @ low dose. Dexmedetomidine initiated  LINES / TUBES: ETT 8/29 >> 9/01 L radial art line 8/28 >>  R [place] CVL 8/29 >>    CULTURES: Urine 8/29 >> NEG Resp  8/29 >> NOF Blood 8/29 >> NEG   ANTIBIOTICS: Rocephin 8/29 >> 8/31 Zithromax 8/29 >> 8/31  SUBJECTIVE:  NAD. Passed SBT. Extubated and initially looks good  VITAL SIGNS: Temp:  [98.1 F (36.7 C)-101.1 F (38.4 C)] 99.1 F (37.3 C) (09/01 1315) Pulse Rate:  [57-93] 90 (09/01 1315) Resp:  [12-25] 19 (09/01 1315) BP: (116-171)/(36-114) 151/114 mmHg (09/01 1315) SpO2:  [94 %-100 %] 95 % (09/01 1315) Arterial Line BP: (111-187)/(43-116) 149/50 mmHg (09/01 1315) FiO2 (%):  [40 %-50 %] 40 % (09/01 0730)  HEMODYNAMICS: CVP:  [8 mmHg] 8 mmHg  VENTILATOR SETTINGS: Vent Mode:  [-] CPAP FiO2 (%):  [40 %-50 %] 40 % Set Rate:  [15 bmp] 15 bmp Vt Set:  [500 mL] 500  mL PEEP:  [5 cmH20] 5 cmH20 Pressure Support:  [5 cmH20] 5 cmH20 Plateau Pressure:  [15 cmH20-19 cmH20] 19 cmH20  INTAKE / OUTPUT: Intake/Output     08/31 0701 - 09/01 0700 09/01 0701 - 09/02 0700   I.V. (mL/kg) 282 (4.4) 30.4 (0.5)   NG/GT 1510 150   IV Piggyback     Total Intake(mL/kg) 1792 (27.9) 180.4 (2.8)   Urine (mL/kg/hr) 1595 (1) 225 (0.5)   Total Output 1595 225   Net +197 -44.6         PHYSICAL EXAMINATION: General: RASS 0. + F/C Neuro:  No focal deficits HEENT:  WNL Neck: JVP not well visualized Cardiovascular: reg, noM Lungs: clear Abdomen:  Soft, NT, ND and +BS. Ext: warm, no edema  LABS: I have reviewed all of today's lab results. Relevant abnormalities are discussed in the A/P section  CXR: probable R effusion/atx  ASSESSMENT / PLAN:  PULMONARY A: Acute hypoxic resp failure Pulm edema, improving P:   Monitor in ICU post extubation Supp O2 as needed to maintain SpO2 > 93% Cont nebulized BDs  CARDIOVASCULAR A: Cardiomyopathy (LVEF 35%) Severe hypertension - intermittent  Non obstructive CAD Hypotension related to sedation, resolved P:   Try to avoid overcorrection of hemodynamic derangements Cont NTG patch ordered 8/31 Cont PRN hydralazine - consider scheduled dose as BP permits  RENAL A: Hypokalemia, resolved P:   Monitor BMET intermittently Monitor I/Os Correct electrolytes as indicated  GASTROINTESTINAL A: No acute issues. P:   SUP: N/I post extubation NPO post extubation, advance diet as tolerated  HEMATOLOGIC A: Thrombocytopenia Mild  anemia without acute blood loss P:   DVT px: SCDs Monitor CBC intermittently Transfuse per usual guidelines  INFECTIOUS A: Pulm opacity without other signs of infection P:   Monitor off abx  ENDOCRINE A: Mild hyperglycemia No documented hx of DM P:   Monitor CBGs Consider SSI for glu > 180  NEUROLOGIC A: Acute Encephalopathy, much improved Severe agitation P:    Wean dex  gtt to off post extubation Low dose PRN fentanyl  TODAY'S SUMMARY:    I have personally obtained a history, examined the patient, evaluated laboratory and imaging results, formulated the assessment and plan and placed orders.  CRITICAL CARE: The patient is critically ill with multiple organ systems failure and requires high complexity decision making for assessment and support, frequent evaluation and titration of therapies, application of advanced monitoring technologies and extensive interpretation of multiple databases. Critical Care Time devoted to patient care services described in this note is 40 minutes.   Billy Fischer, MD ; [facility] [phone].  After 5:30 PM or weekends, call [phone]   05/11/2014, 2:14 PM

## 2014-05-11 NOTE — Procedures (Signed)
Extubation Procedure Note  Patient Details:   Name: [name] DOB: Aug 22, 1941 MRN: [id number]   Airway Documentation:     Evaluation  O2 sats: stable throughout Complications: No apparent complications Patient did tolerate procedure well. Bilateral Breath Sounds: Clear;Diminished Suctioning: Airway Yes  Patient extubated to 4LNC.  Positive cuff leak.  No evidence of stridor.  Sats currently 98%.  Vitals stable.  RT will continue to monitor.    Durwin Glaze 05/11/2014, 10:47 AM

## 2014-05-12 ENCOUNTER — Inpatient Hospital Stay (HOSPITAL_COMMUNITY): Payer: Medicare Other

## 2014-05-12 DIAGNOSIS — I5023 Acute on chronic systolic (congestive) heart failure: Secondary | ICD-10-CM

## 2014-05-12 DIAGNOSIS — I509 Heart failure, unspecified: Secondary | ICD-10-CM

## 2014-05-12 LAB — BASIC METABOLIC PANEL
ANION GAP: 10 (ref 5–15)
BUN: 13 mg/dL (ref 6–23)
CALCIUM: 8.5 mg/dL (ref 8.4–10.5)
CO2: 30 meq/L (ref 19–32)
Chloride: 102 mEq/L (ref 96–112)
Creatinine, Ser: 0.77 mg/dL (ref 0.50–1.35)
GFR calc Af Amer: >90 mL/min (ref >90)
GFR calc non Af Amer: 90 mL/min — ABNORMAL LOW (ref >90)
Glucose, Bld: 92 mg/dL (ref 70–99)
Potassium: 3.5 mEq/L — ABNORMAL LOW (ref 3.7–5.3)
Sodium: 142 mEq/L (ref 137–147)

## 2014-05-12 LAB — CBC
HEMATOCRIT: 30.7 % — AB (ref 39.0–52.0)
Hemoglobin: 9.9 g/dL — ABNORMAL LOW (ref 13.0–17.0)
MCH: 28.4 pg (ref 26.0–34.0)
MCHC: 32.2 g/dL (ref 30.0–36.0)
MCV: 88.2 fL (ref 78.0–100.0)
Platelets: 110 10*3/uL — ABNORMAL LOW (ref 150–400)
RBC: 3.48 MIL/uL — ABNORMAL LOW (ref 4.22–5.81)
RDW: 14.4 % (ref 11.5–15.5)
WBC: 6 10*3/uL (ref 4.0–10.5)

## 2014-05-12 LAB — GLUCOSE, CAPILLARY
GLUCOSE-CAPILLARY: 152 mg/dL — AB (ref 70–99)
Glucose-Capillary: 92 mg/dL (ref 70–99)
Glucose-Capillary: 92 mg/dL (ref 70–99)
Glucose-Capillary: 112 mg/dL — ABNORMAL HIGH (ref 70–99)
Glucose-Capillary: 92 mg/dL (ref 70–99)

## 2014-05-12 LAB — PRO B NATRIURETIC PEPTIDE: PRO B NATRI PEPTIDE: 3295 pg/mL — AB (ref 0–125)

## 2014-05-12 MED ORDER — LOSARTAN POTASSIUM 50 MG PO TABS
50.0000 mg | ORAL_TABLET | Freq: Every day | ORAL | Status: DC
  Administered 2014-05-12 – 2014-05-13 (×2): 50 mg via ORAL
  Filled 2014-05-12 (×4): qty 1

## 2014-05-12 MED ORDER — ENSURE COMPLETE PO LIQD
237.0000 mL | Freq: Two times a day (BID) | ORAL | Status: DC
  Administered 2014-05-12 – 2014-05-18 (×11): 237 mL via ORAL

## 2014-05-12 MED ORDER — CETYLPYRIDINIUM CHLORIDE 0.05 % MT LIQD
7.0000 mL | Freq: Two times a day (BID) | OROMUCOSAL | Status: DC
  Administered 2014-05-13 – 2014-05-18 (×10): 7 mL via OROMUCOSAL

## 2014-05-12 MED ORDER — CARVEDILOL 3.125 MG PO TABS
3.1250 mg | ORAL_TABLET | Freq: Two times a day (BID) | ORAL | Status: DC
  Administered 2014-05-12 (×2): 3.125 mg via ORAL
  Filled 2014-05-12 (×5): qty 1

## 2014-05-12 MED ORDER — LABETALOL HCL 5 MG/ML IV SOLN
INTRAVENOUS | Status: AC
  Filled 2014-05-12: qty 4

## 2014-05-12 MED ORDER — LABETALOL HCL 5 MG/ML IV SOLN
10.0000 mg | Freq: Once | INTRAVENOUS | Status: AC
  Administered 2014-05-12: 10 mg via INTRAVENOUS

## 2014-05-12 MED ORDER — FUROSEMIDE 10 MG/ML IJ SOLN
40.0000 mg | Freq: Two times a day (BID) | INTRAMUSCULAR | Status: DC
  Administered 2014-05-12 – 2014-05-18 (×13): 40 mg via INTRAVENOUS
  Filled 2014-05-12 (×17): qty 4

## 2014-05-12 MED ORDER — POTASSIUM CHLORIDE CRYS ER 20 MEQ PO TBCR
20.0000 meq | EXTENDED_RELEASE_TABLET | Freq: Two times a day (BID) | ORAL | Status: DC
  Administered 2014-05-12 – 2014-05-13 (×4): 20 meq via ORAL
  Filled 2014-05-12 (×6): qty 1

## 2014-05-12 MED ORDER — LOSARTAN POTASSIUM 25 MG PO TABS: 25.0000 mg | ORAL_TABLET | Freq: Every day | ORAL | Status: DC

## 2014-05-12 NOTE — Progress Notes (Addendum)
       Patient Name: [name] Date of Encounter: 05/12/2014    SUBJECTIVE: having difficulty breathing. Has had constipation. Was successfully extubated yesterday.  TELEMETRY:  Normal sinus rhythm with bundle branch block Filed Vitals:   05/12/14 0602 05/12/14 0700 05/12/14 0800 05/12/14 0840  BP: 191/80 188/59 211/66   Pulse: 97 109 102   Temp: 100.2 F (37.9 C) 99.1 F (37.3 C) 99.9 F (37.7 C)   TempSrc:      Resp: 23 25 21    Height:      Weight:      SpO2: 93% 98% 96% 96%    Intake/Output Summary (Last 24 hours) at 05/12/14 1000 Last data filed at 05/12/14 0900  Gross per 24 hour  Intake    240 ml  Output   2670 ml  Net  -2430 ml   LABS: Basic Metabolic Panel:  Recent Labs  53/97/67 0500 05/11/14 0400 05/12/14 0344  NA 142 143 142  K 4.1 4.1 3.5*  CL 107 106 102  CO2 27 27 30   GLUCOSE 102* 128* 92  BUN 12 18 13   CREATININE 0.70 0.73 0.77  CALCIUM 8.1* 8.6 8.5  MG 2.1  --   --   PHOS 2.2*  --   --    CBC:  Recent Labs  05/11/14 0400 05/12/14 0344  WBC 6.1 6.0  HGB 9.8* 9.9*  HCT 30.6* 30.7*  MCV 87.9 88.2  PLT 97* 110*   BNP    Component Value Date/Time   PROBNP 3295.0* 05/12/2014 0344   Radiology/Studies:  No new data  Physical Exam: Blood pressure 211/66, pulse 102, temperature 99.9 F (37.7 C), temperature source Core (Comment), resp. rate 21, height 5\' 5"  (1.651 m), weight 141 lb 8.6 oz (64.2 kg), SpO2 96.00%. Weight change:   Wt Readings from Last 3 Encounters:  05/10/14 141 lb 8.6 oz (64.2 kg)  05/10/14 141 lb 8.6 oz (64.2 kg)   Mild respiratory distress Loud S3 gallop. 3/6 systolic MR murmur Decreased breath sounds at the bases Awake and conversant  ASSESSMENT:  1. Acute on chronic systolic heart failure, improved and now extubated, with persistent dyspnea and tachycardia 2. History of significant hypertension 3. Left bundle branch block  Plan:  1. Diuresis and potassium repletion. Daily BMET 2. Institute very  low-dose beta blocker therapy 3. Will resume renin / angiotensin blockade if his blood pressure tolerates the above 2 measures 4. Consider resynchronization therapy in the future if we stabilize the patient on medical therapy  Signed, Lesleigh Noe 05/12/2014, 10:00 AM

## 2014-05-12 NOTE — Progress Notes (Signed)
NUTRITION FOLLOW-UP  INTERVENTION: Ensure Complete po BID, each supplement provides 350 kcal and 13 grams of protein  NUTRITION DIAGNOSIS: Inadequate oral intake now related to decreased appetite post extubation as evidenced by meal completion <50%; ongoing.     Goal: TF to meet >90% of estimated nutritional needs, not met.   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   Pt extubated 9/1 and started on diet this am.  Potassium low.  Per pt he did not eat much this am. Was not really that hungry. Pt is agreeable to supplements.   Height: Ht Readings from Last 1 Encounters:  05/08/14 _0  (1.651 m)    Weight: Wt Readings from Last 1 Encounters:  05/10/14 141 lb 8.6 oz (64.2 kg)     BMI:  Body mass index is 23.55 kg/(m^2).  Estimated Nutritional Needs: Kcal: 1600-1800 Protein: 75-90 grams Fluid: >1.6 L/day  Skin: intact  Diet Order: Cardiac   Intake/Output Summary (Last 24 hours) at 05/12/14 1023 Last data filed at 05/12/14 0900  Gross per 24 hour  Intake    240 ml  Output   2670 ml  Net  -2430 ml    Last BM: 9/2  Labs:   Recent Labs Lab 05/08/14 1530 05/09/14 0358 05/10/14 0500 05/11/14 0400 05/12/14 0344  NA 143 143 142 143 142  K 3.8 3.3* 4.1 4.1 3.5*  CL 105 103 107 106 102  CO2 _1 BUN _2 CREATININE 0.95 1.07 0.70 0.73 0.77  CALCIUM 7.7* 7.8* 8.1* 8.6 8.5  MG 1.5 2.7* 2.1  --   --   PHOS  --  3.3 2.2*  --   --   GLUCOSE 89 131* 102* 128* 92    CBG (last 3)   Recent Labs  05/11/14 1652 05/12/14 0309 05/12/14 0815  GLUCAP 123* 92 92    Scheduled Meds: . antiseptic oral rinse  7 mL Mouth Rinse QID  . aspirin  81 mg Oral Daily  . atorvastatin  10 mg Oral q1800  . carvedilol  3.125 mg Oral BID WC  . chlorhexidine  15 mL Mouth Rinse BID  . clopidogrel  75 mg Oral Daily  .  furosemide  40 mg Intravenous Q12H  . ipratropium-albuterol  3 mL Nebulization Q6H  . labetalol      . [START ON 05/13/2014] losartan  25 mg Oral Daily  . nitroGLYCERIN  0.3 mg Transdermal Daily  . potassium chloride  20 mEq Oral BID  . sodium chloride  10-40 mL Intracatheter Q12H    Continuous Infusions: . dexmedetomidine Stopped (05/11/14 1040)   [place], [place], CNSC [phone] Pager [phone] After Hours Pager

## 2014-05-12 NOTE — Progress Notes (Signed)
Per RN - no longer measuring CVP.

## 2014-05-12 NOTE — Progress Notes (Signed)
PULMONARY  / CRITICAL CARE MEDICINE CONSULTATION   Name: [name] MRN: [id number] DOB: May 23, 1941    ADMISSION DATE:  05/08/2014  REQUESTING CLINICIAN: Excell Seltzer PRIMARY SERVICE: Cardiology  CHIEF COMPLAINT:  Respiratory Arrest  BRIEF PATIENT DESCRIPTION: 68 M with CAD presented to ED 8/29 with acute resp distress/failure requiring intubation and due to severe hypertension-induced pulmonary edema. Had brief VT in ED. Admitted by Cardiology and PCCM consulting  SIGNIFICANT EVENTS / STUDIES:  8/29 Cardiac cath: Coronary artery disease with nonobstructive LAD stenosis, severe diagonal stenosis, total occlusion of the first OM branch, and patency of the RCA stented segment. LVEF 35%. Severe segmental LV systolic dysfunction 8/31 Initially RASS -4 and hypopneic on SBT. Fentanyl infusion stopped. Pt became agitated but initially passed SBT. Then became severely hypertensive with increasing agitation and worsening hypoxemia. Placed back on full vent support and sedated again. Received hydralazine and furosemide. Subsequently was hypotensive with resumption of norepi @ low dose. Dexmedetomidine initiated  LINES / TUBES: ETT 8/29 >> 9/01 L radial art line 8/28 >>  R [place] CVL 8/29 >>    CULTURES: Urine 8/29 >> NEG Resp  8/29 >> NOF Blood 8/29 >> NEG   ANTIBIOTICS: Rocephin 8/29 >> 8/31 Zithromax 8/29 >> 8/31  SUBJECTIVE:  No distress  VITAL SIGNS: Temp:  [98.1 F (36.7 C)-101.5 F (38.6 C)] 99.9 F (37.7 C) (09/02 0800) Pulse Rate:  [48-109] 102 (09/02 0800) Resp:  [16-25] 21 (09/02 0800) BP: (126-211)/(36-114) 211/66 mmHg (09/02 0800) SpO2:  [93 %-98 %] 96 % (09/02 0840) Arterial Line BP: (111-187)/(43-69) 117/51 mmHg (09/01 1600)  HEMODYNAMICS: CVP:  [4 mmHg] 4 mmHg  VENTILATOR SETTINGS:    INTAKE / OUTPUT: Intake/Output     09/01 0701 - 09/02 0700 09/02 0701 - 09/03 0700   P.O. 240    I.V. (mL/kg) 30.4 (0.5)    NG/GT 150    Total Intake(mL/kg) 420.4 (6.5)    Urine  (mL/kg/hr) 2325 (1.5) 120 (0.9)   Total Output 2325 120   Net -1904.6 -120        Stool Occurrence 1 x     PHYSICAL EXAMINATION: General: RASS 0. + F/C Neuro:  No focal deficits HEENT:  WNL Neck: JVP wnl Cardiovascular: s1 s2 rrr Lungs: clear Abdomen:  Soft, NT, ND and +BS. Ext: warm, no edema  LABS: I have reviewed all of today's lab results. Relevant abnormalities are discussed in the A/P section  CXR: probable R effusion/atx  ASSESSMENT / PLAN:  PULMONARY A: Acute hypoxic resp failure Pulm edema, improving P:   Monitor in ICU post extubation Supp O2 as needed to maintain SpO2 > 93% Cont nebulized BDs - seems dependent on them, will use xopenex  CARDIOVASCULAR A: Cardiomyopathy (LVEF 35%) Severe hypertension - intermittent  Non obstructive CAD Hypotension related to sedation, resolved P:   Try to avoid overcorrection of hemodynamic derangements Cont NTG patch ordered 8/31 Cont PRN hydralazine - consider scheduled dose as BP permits Per cards Need further after load control  RENAL A: Hypokalemia, resolved More room for diuresis P:   Monitor BMET am Needs further lasix  GASTROINTESTINAL A: No acute issues. P:   SUP: N/I post extubation diet  HEMATOLOGIC A: Thrombocytopenia Mild anemia without acute blood loss P:   DVT px: SCDs Monitor CBC intermittently Transfuse per usual guidelines  INFECTIOUS A: Pulm opacity without other signs of infection P:   Monitor off abx  ENDOCRINE A: Mild hyperglycemia No documented hx of DM P:   Monitor CBGs  Consider SSI for glu > 180  NEUROLOGIC A: Acute Encephalopathy, much improved Severe agitation P:    Low dose PRN fentanyl After BP/ HR improved then to ask PT  TODAY'S SUMMARY: needs further lasix, further HR/ BP afterload control, xopenex, will so, call if needed   I have personally obtained a history, examined the patient, evaluated laboratory and imaging results, formulated the  assessment and plan and placed orders.  Mcarthur Rossetti. Tyson Alias, MD, FACP Pgr: [phone] [facility]

## 2014-05-13 LAB — BASIC METABOLIC PANEL
Anion gap: 15 (ref 5–15)
BUN: 14 mg/dL (ref 6–23)
CHLORIDE: 97 meq/L (ref 96–112)
CO2: 31 meq/L (ref 19–32)
Calcium: 9.1 mg/dL (ref 8.4–10.5)
Creatinine, Ser: 0.73 mg/dL (ref 0.50–1.35)
GFR calc Af Amer: >90 mL/min (ref >90)
GFR calc non Af Amer: >90 mL/min (ref >90)
GLUCOSE: 110 mg/dL — AB (ref 70–99)
POTASSIUM: 3.5 meq/L — AB (ref 3.7–5.3)
SODIUM: 143 meq/L (ref 137–147)

## 2014-05-13 LAB — GLUCOSE, CAPILLARY
GLUCOSE-CAPILLARY: 124 mg/dL — AB (ref 70–99)
Glucose-Capillary: 108 mg/dL — ABNORMAL HIGH (ref 70–99)

## 2014-05-13 MED ORDER — ENOXAPARIN SODIUM 40 MG/0.4ML ~~LOC~~ SOLN
40.0000 mg | SUBCUTANEOUS | Status: DC
  Administered 2014-05-13 – 2014-05-17 (×5): 40 mg via SUBCUTANEOUS
  Filled 2014-05-13 (×7): qty 0.4

## 2014-05-13 MED ORDER — CARVEDILOL 6.25 MG PO TABS
6.2500 mg | ORAL_TABLET | Freq: Two times a day (BID) | ORAL | Status: DC
  Administered 2014-05-13 (×2): 6.25 mg via ORAL
  Filled 2014-05-13 (×5): qty 1

## 2014-05-13 NOTE — Progress Notes (Signed)
Pt arrived to floor via wheelchair. Pt placed on heart monitor, A/Ox4 in NAD, VSS.

## 2014-05-13 NOTE — Progress Notes (Signed)
       Patient Name: [name] Date of Encounter: 05/13/2014    SUBJECTIVE: Complaining of constipation. Has had clear foamy stools. Denies chest pain. Slept better last evening.  TELEMETRY:  Sinus tachycardia Filed Vitals:   05/13/14 0500 05/13/14 0600 05/13/14 0700 05/13/14 0813  BP: 125/33 138/67 152/59   Pulse: 101 104 104   Temp:      TempSrc:      Resp: 22 20 25    Height:      Weight:      SpO2: 91% 93% 96% 95%    Intake/Output Summary (Last 24 hours) at 05/13/14 0934 Last data filed at 05/13/14 0400  Gross per 24 hour  Intake    646 ml  Output   2800 ml  Net  -2154 ml   LABS: Basic Metabolic Panel:  Recent Labs  40/76/80 0344 05/13/14 0400  NA 142 143  K 3.5* 3.5*  CL 102 97  CO2 30 31  GLUCOSE 92 110*  BUN 13 14  CREATININE 0.77 0.73  CALCIUM 8.5 9.1   CBC:  Recent Labs  05/11/14 0400 05/12/14 0344  WBC 6.1 6.0  HGB 9.8* 9.9*  HCT 30.6* 30.7*  MCV 87.9 88.2  PLT 97* 110*     Radiology/Studies:  No new data  Physical Exam: Blood pressure 152/59, pulse 104, temperature 99 F (37.2 C), temperature source Oral, resp. rate 25, height 5\' 5"  (1.651 m), weight 141 lb 8.6 oz (64.2 kg), SpO2 95.00%. Weight change:   Wt Readings from Last 3 Encounters:  05/10/14 141 lb 8.6 oz (64.2 kg)  05/10/14 141 lb 8.6 oz (64.2 kg)   Elevated jugular veins to the angle of the jaw bilateral Apical systolic murmur compatible with mitral regurgitation Lung fields are clear anteriorly with basal rales No peripheral edema  ASSESSMENT:  1. Acute on chronic systolic heart failure, improving, and now nearly 4 L negative over the past 48 hours 2. Hypertension, still poorly controlled 3. Abdominal bloating and distention with abnormal stools. 4. Hypokalemia 5. Left bundle branch block  Plan:  1. Continue diuresis while tracking renal function 2. C. difficile toxin to rule out C. difficile colitis 3. Optimize therapy for control of blood pressure 4. We  will reinstitute angiotensin receptor blocker therapy today 5. Once stable consider resynchronization therapy 6. Likely transfer to floor today 7. Replete potassium  Signed, Lesleigh Noe 05/13/2014, 9:34 AM

## 2014-05-13 NOTE — Progress Notes (Signed)
Pt ambulatory in room with 1 assist. Pt has foley for "aggressive diuresis". RN educated on importance of removing foley to prevent infection. Pt refused for foley to be removed at this time. Requested that RN remove foley in AM.

## 2014-05-13 NOTE — Plan of Care (Signed)
Problem: Phase I Progression Outcomes Goal: Up in chair, BRP Outcome: Progressing Pt OOB with 1 assist, pt unsteady on feet, bed alarm in place Goal: Voiding-avoid urinary catheter unless indicated Outcome: Not Progressing Pt still continues with foley catheter, pt refused to remove at this time

## 2014-05-14 DIAGNOSIS — I4949 Other premature depolarization: Secondary | ICD-10-CM

## 2014-05-14 DIAGNOSIS — I10 Essential (primary) hypertension: Secondary | ICD-10-CM

## 2014-05-14 LAB — BASIC METABOLIC PANEL
Anion gap: 13 (ref 5–15)
BUN: 18 mg/dL (ref 6–23)
CO2: 31 meq/L (ref 19–32)
Calcium: 9.1 mg/dL (ref 8.4–10.5)
Chloride: 100 mEq/L (ref 96–112)
Creatinine, Ser: 0.81 mg/dL (ref 0.50–1.35)
GFR calc Af Amer: >90 mL/min (ref >90)
GFR, EST NON AFRICAN AMERICAN: 88 mL/min — AB (ref >90)
Glucose, Bld: 100 mg/dL — ABNORMAL HIGH (ref 70–99)
POTASSIUM: 3.2 meq/L — AB (ref 3.7–5.3)
SODIUM: 144 meq/L (ref 137–147)

## 2014-05-14 LAB — MAGNESIUM: Magnesium: 1.9 mg/dL (ref 1.5–2.5)

## 2014-05-14 LAB — CULTURE, BLOOD (ROUTINE X 2)
CULTURE: NO GROWTH
Culture: NO GROWTH

## 2014-05-14 LAB — GLUCOSE, CAPILLARY: Glucose-Capillary: 104 mg/dL — ABNORMAL HIGH (ref 70–99)

## 2014-05-14 LAB — CLOSTRIDIUM DIFFICILE BY PCR: Toxigenic C. Difficile by PCR: NEGATIVE

## 2014-05-14 MED ORDER — CARVEDILOL 6.25 MG PO TABS
9.3750 mg | ORAL_TABLET | Freq: Two times a day (BID) | ORAL | Status: DC
  Administered 2014-05-14 – 2014-05-15 (×3): 9.375 mg via ORAL
  Filled 2014-05-14 (×6): qty 1

## 2014-05-14 MED ORDER — POTASSIUM CHLORIDE CRYS ER 20 MEQ PO TBCR
40.0000 meq | EXTENDED_RELEASE_TABLET | Freq: Two times a day (BID) | ORAL | Status: DC
  Administered 2014-05-14 – 2014-05-18 (×9): 40 meq via ORAL
  Filled 2014-05-14 (×11): qty 2

## 2014-05-14 MED ORDER — LOSARTAN POTASSIUM 50 MG PO TABS
50.0000 mg | ORAL_TABLET | Freq: Two times a day (BID) | ORAL | Status: DC
  Administered 2014-05-14 – 2014-05-18 (×9): 50 mg via ORAL
  Filled 2014-05-14 (×10): qty 1

## 2014-05-14 NOTE — Progress Notes (Addendum)
Pt with 6 beats wide QRS, pt states tech came in to room to check VS and pt felt "something" in his chest, pt states he felt a "boop". Pt with no complaints of chest pain, no shortness of breath. VSS, HR in 80s SR with left BBB and frequent PVCs. Pt scheduled for BMET in AM. MD on call paged. New order for Magnesium level to be drawn in AM and to continue to monitor/notify for sustained Vtach/Wide QRS. Pt currently sleeping/resting comfortably in bed.  Addendum: Called by RN regarding arrhythmia and potassium level 3.2. Will increase supplement from 20 mEq twice a day to 40 mEq twice a day while on IV Lasix  Theodore Demark, PA-C 05/14/2014 6:50 AM Beeper [phone]

## 2014-05-14 NOTE — Progress Notes (Signed)
Subjective:  No recurrent chest pain  Objective:   Vital Signs in the last 24 hours: Temp:  [98 F (36.7 C)-99.1 F (37.3 C)] 98.5 F (36.9 C) (09/04 0553) Pulse Rate:  [78-107] 89 (09/04 0758) Resp:  [16-22] 18 (09/04 0758) BP: (148-176)/(62-115) 157/73 mmHg (09/04 0553) SpO2:  [91 %-100 %] 96 % (09/04 0758) Weight:  [136 lb 7.4 oz (61.9 kg)-136 lb 12.8 oz (62.052 kg)] 136 lb 7.4 oz (61.9 kg) (09/04 0553)  Intake/Output from previous day: 09/03 0701 - 09/04 0700 In: 740 [P.O.:740] Out: 1725 [Urine:1725]  I/O since admission: -4881  Medications: . antiseptic oral rinse  7 mL Mouth Rinse BID  . aspirin  81 mg Oral Daily  . atorvastatin  10 mg Oral q1800  . carvedilol  6.25 mg Oral BID WC  . clopidogrel  75 mg Oral Daily  . enoxaparin (LOVENOX) injection  40 mg Subcutaneous Q24H  . feeding supplement (ENSURE COMPLETE)  237 mL Oral BID BM  . furosemide  40 mg Intravenous Q12H  . ipratropium-albuterol  3 mL Nebulization Q6H  . losartan  50 mg Oral Daily  . nitroGLYCERIN  0.3 mg Transdermal Daily  . potassium chloride  40 mEq Oral BID  . sodium chloride  10-40 mL Intracatheter Q12H        Physical Exam:   General appearance: alert, cooperative and no distress Neck: JVD increased at 8 cm; no carotid bruit, supple, symmetrical, trachea midline and thyroid not enlarged, symmetric, no tenderness/mass/nodules Lungs: decreased BS at bases; no wheezing Heart: regular rate and rhythm and 1/6 sem Abdomen: soft, non-tender; bowel sounds normal; no masses,  no organomegaly Extremities: no edema, redness or tenderness in the calves or thighs Neurologic: Grossly normal   Rate: 90  Rhythm: normal sinus rhythm rare PVC;  6 beat episode earlier  Lab Results:   Recent Labs  05/13/14 0400 05/14/14 0340  NA 143 144  K 3.5* 3.2*  CL 97 100  CO2 31 31  GLUCOSE 110* 100*  BUN 14 18  CREATININE 0.73 0.81    No results found for this basename: TROPONINI, CK, MB,  in the  last 72 hours  Hepatic Function Panel No results found for this basename: PROT, ALBUMIN, AST, ALT, ALKPHOS, BILITOT, BILIDIR, IBILI,  in the last 72 hours No results found for this basename: INR,  in the last 72 hours BNP (last 3 results)  Recent Labs  05/12/14 0344  PROBNP 3295.0*    Lipid Panel     Component Value Date/Time   TRIG 71 05/08/2014 0105      Imaging:   ------------------------------------------------------------------- LV EF: 20% - 25%  ------------------------------------------------------------------- History: PMH: Acute pulmonary edema. Post cardiac cath.  ------------------------------------------------------------------- Echo Study Conclusions  - Left ventricle: The cavity size was normal. Wall thickness was increased in a pattern of severe LVH. Systolic function was severely reduced. The estimated ejection fraction was in the range of 20% to 25%. Severe global hypokinesis. There was an increased relative contribution of atrial contraction to ventricular filling. Doppler parameters are consistent with abnormal left ventricular relaxation (grade 1 diastolic dysfunction). Doppler parameters are consistent with high ventricular filling pressure. - Ventricular septum: Septal motion showed abnormal function and dyssynergy. These changes are consistent with a left bundle branch block. - Aortic valve: Mildly calcified annulus. Trileaflet; mildly thickened leaflets. - Mitral valve: Moderately thickened leaflets . - Right ventricle: Systolic function was mildly reduced. - Pulmonary arteries: PA peak pressure: 38 mm Hg (S). - Pericardium,  extracardiac: A small pericardial effusion was identified. No evidence of hemodynamic sequelae.   Cardiac Cath Final Conclusions:  1. Coronary artery disease with nonobstructive LAD stenosis, severe diagonal stenosis, total occlusion of the first OM branch, and patency of the RCA stented segment  2. Severe segmental  LV systolic dysfunction  3. Normal LVEDP after administration of IV nitroglycerin and IV beta blocker  4. Suspected respiratory failure from acute pulmonary edema/hypertensive emergency   Assessment/Plan:   Active Problems:   Acute pulmonary edema   Hypotension, unspecified   Acute respiratory failure   Altered mental status   HTN (hypertension), malignant   Acute on chronic systolic congestive heart failure, NYHA class 4   Cardiomyopathy, ischemic   LBBB (left bundle branch block)   Thrombocytopathia   Hypokalemia   Pulmonary edema   1. CAD: anatomy as noted, unchanged from previous studies; medical therapy. 1. Ischemic cardiomyopathy with acute on chronic systolic heart failure improving, I/O -4881 since admission  2. Hypertension/CHF;   Started on ARB on 9/2, will titrate today to 50 mg bid and follow renal function.  Consider adding aldosterone blockade with spironolactone but will not start today 3. Abdominal bloating and distention with abnormal stools.  4. Hypokalemia  5. Left bundle branch block 6. Ventricular ectopy;  Will titrate carvedilol to 9.375 mg bid today   Pt with EF 20 - 25 %; will need initial life-vest post discharge and if LV function does not improve with titrating medical therapy then ICD.      Lennette Bihari, MD, [facility] 05/14/2014, 10:14 AM

## 2014-05-15 ENCOUNTER — Encounter (HOSPITAL_COMMUNITY): Payer: Self-pay | Admitting: Internal Medicine

## 2014-05-15 LAB — BASIC METABOLIC PANEL
ANION GAP: 13 (ref 5–15)
BUN: 18 mg/dL (ref 6–23)
CALCIUM: 8.9 mg/dL (ref 8.4–10.5)
CO2: 31 meq/L (ref 19–32)
CREATININE: 0.87 mg/dL (ref 0.50–1.35)
Chloride: 97 mEq/L (ref 96–112)
GFR calc Af Amer: >90 mL/min (ref >90)
GFR calc non Af Amer: 85 mL/min — ABNORMAL LOW (ref >90)
Glucose, Bld: 159 mg/dL — ABNORMAL HIGH (ref 70–99)
Potassium: 3.9 mEq/L (ref 3.7–5.3)
Sodium: 141 mEq/L (ref 137–147)

## 2014-05-15 LAB — GLUCOSE, CAPILLARY
GLUCOSE-CAPILLARY: 105 mg/dL — AB (ref 70–99)
Glucose-Capillary: 97 mg/dL (ref 70–99)

## 2014-05-15 LAB — MAGNESIUM: Magnesium: 1.9 mg/dL (ref 1.5–2.5)

## 2014-05-15 NOTE — Evaluation (Signed)
Physical Therapy Evaluation Patient Details Name: [name] MRN: [id number] DOB: [birth year] Today's Date: 05/15/2014   History of Present Illness  [name] is a 73 yo male with h/o HTN, HLD, noncompliance, known LBBB, Systolic dysfunction with EF 40% and known CAD.  He developed severe chest pain and SOB in a parking lot, at hospital diagnosed with STEMI  and pulm edema.   PMHx:  HLD, COPD, chronic back pain.  Clinical Impression  Pt has been up walking without assistance in his room with no AD, which PT recommended he not do any more.  FWW was appropriate and pt/nursing agreed was safer.  Reminders will be needed from his nurse and will reinforce with PT visits.  Pt agreed to HHPT at discharge if still appropriate.    Follow Up Recommendations Home health PT;Supervision/Assistance - 24 hour    Equipment Recommendations  Rolling walker with 5" wheels    Recommendations for Other Services Other (comment) (HHPT)     Precautions / Restrictions Precautions Precautions: Fall Restrictions Weight Bearing Restrictions: No      Mobility  Bed Mobility                  Transfers Overall transfer level: Modified independent Equipment used: Rolling walker (2 wheeled);None             General transfer comment: reminders for hand placement were needed  Ambulation/Gait Ambulation/Gait assistance: Supervision;Min guard Ambulation Distance (Feet): 400 Feet Assistive device: Rolling walker (2 wheeled) Gait Pattern/deviations: Step-through pattern;Decreased dorsiflexion - left;Shuffle;Wide base of support;Drifts right/left Gait velocity: slow Gait velocity interpretation: Below normal speed for age/gender General Gait Details: nearly sliding L foot on ground with each step and listing R to assist with clearing floor.  Pt reports he never had any obvicous changes between [name]  Stairs            Wheelchair Mobility    Modified Rankin (Stroke Patients Only)        Balance Overall balance assessment: Needs assistance Sitting-balance support: Feet supported Sitting balance-Leahy Scale: Good   Postural control: Posterior lean Standing balance support: Bilateral upper extremity supported Standing balance-Leahy Scale: Poor Standing balance comment: Safer with RW and without tends to lean and catch his balance                             Pertinent Vitals/Pain Pain Assessment: No/denies pain    Home Living Family/patient expects to be discharged to:: Private residence Living Arrangements: Spouse/significant other Available Help at Discharge: Family;Available 24 hours/day Type of Home: House Home Access: Stairs to enter Entrance Stairs-Rails: Right Entrance Stairs-Number of Steps: 3 Home Layout: One level Home Equipment: Cane - quad;Cane - single point      Prior Function Level of Independence: Independent               Hand Dominance        Extremity/Trunk Assessment   Upper Extremity Assessment: Overall WFL for tasks assessed           Lower Extremity Assessment: Generalized weakness      Cervical / Trunk Assessment: Normal  Communication   Communication: No difficulties  Cognition Arousal/Alertness: Awake/alert Behavior During Therapy: WFL for tasks assessed/performed Overall Cognitive Status: Within Functional Limits for tasks assessed                      General Comments General comments (skin integrity, edema, etc.): Pt  is presenting with weakness on LLE mainly with no CVA history but recent STEMi    Exercises        Assessment/Plan    PT Assessment Patient needs continued PT services  PT Diagnosis Difficulty walking   PT Problem List Decreased strength;Decreased range of motion;Decreased activity tolerance;Decreased balance;Decreased mobility;Decreased coordination;Decreased knowledge of use of DME;Decreased safety awareness  PT Treatment Interventions DME instruction;Gait  training;Stair training;Functional mobility training;Therapeutic activities;Therapeutic exercise;Balance training;Neuromuscular re-education;Patient/family education   PT Goals (Current goals can be found in the Care Plan section) Acute Rehab PT Goals Patient Stated Goal: to walk alone and go home PT Goal Formulation: With patient Time For Goal Achievement: 05/22/14 Potential to Achieve Goals: Good    Frequency Min 3X/week   Barriers to discharge Inaccessible home environment;Decreased caregiver support      Co-evaluation               End of Session Equipment Utilized During Treatment: Gait belt;Other (comment) (FWW) Activity Tolerance: Patient tolerated treatment well Patient left: in chair;with call bell/phone within reach Nurse Communication: Mobility status;Precautions         Time: 1610-9604 PT Time Calculation (min): 23 min   Charges:   PT Evaluation $Initial PT Evaluation Tier I: 1 Procedure PT Treatments $Gait Training: 8-22 mins   PT G Codes:          Ivar Drape 06-13-14, 5:50 PM  Samul Dada, PT MS Acute Rehab Dept. Number: [phone]

## 2014-05-15 NOTE — Progress Notes (Signed)
Patient Name: [name]      SUBJECTIVE:     Admitted with acute resp failure and changing mental status hypoxia and hypercarbia and intubated  According to EMS he ahd VT 30 sec and then converted; also hypertensive urgency and with chronic LBBB   > no changes in disease  LHC>>CTO in OM, 50% LAD disease, high grade OM and Diag disease, and RCA stenosis s/p DES ( although i dont see prior records )   EF 35%  At cath  20-25% at echo (same day) with severe  LVH  Following extubation   Bb and ARB initiated  Diarrhea with C  Diff (-)  He is much better and is now ambulating in the room.   Past Medical History  Diagnosis Date  . Acute on chronic systolic congestive heart failure, NYHA class 4 05/08/2014  . Cardiomyopathy, ischemic 05/08/2014  . 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 LBBB (left bundle branch block) 05/08/2014    Scheduled Meds:  Scheduled Meds: . antiseptic oral rinse  7 mL Mouth Rinse BID  . aspirin  81 mg Oral Daily  . atorvastatin  10 mg Oral q1800  . carvedilol  9.375 mg Oral BID WC  . clopidogrel  75 mg Oral Daily  . enoxaparin (LOVENOX) injection  40 mg Subcutaneous Q24H  . feeding supplement (ENSURE COMPLETE)  237 mL Oral BID BM  . furosemide  40 mg Intravenous Q12H  . ipratropium-albuterol  3 mL Nebulization Q6H  . losartan  50 mg Oral BID  . nitroGLYCERIN  0.3 mg Transdermal Daily  . potassium chloride  40 mEq Oral BID  . sodium chloride  10-40 mL Intracatheter Q12H   Continuous Infusions:  acetaminophen, fentaNYL, ondansetron (ZOFRAN) IV, sodium chloride    PHYSICAL EXAM Filed Vitals:   05/14/14 1958 05/15/14 0204 05/15/14 0448 05/15/14 0911  BP: 134/84  129/68   Pulse: 88  77   Temp: 98 F (36.7 C)  98.1 F (36.7 C)   TempSrc: Axillary  Oral   Resp: 18  18   Height:      Weight:   136 lb 9.6 oz (61.961 kg)   SpO2: 100% 100% 100% 97%  Well developed and nourished in no acute distress HENT normal Neck supple with  JVP-flat Clear Regular rate and rhythm, no murmurs or gallops Abd-soft with active BS No Clubbing cyanosis edema Skin-warm and dry A & Oriented  Grossly normal sensory and motor function   TELEMETRY: Reviewed telemetry pt in nsr :    Intake/Output Summary (Last 24 hours) at 05/15/14 1203 Last data filed at 05/15/14 0852  Gross per 24 hour  Intake    720 ml  Output   1675 ml  Net   -955 ml    LABS: Basic Metabolic Panel:  Recent Labs Lab 05/09/14 0358 05/10/14 0500 05/11/14 0400 05/12/14 0344 05/13/14 0400 05/14/14 0340 05/15/14 0524  NA 143 142 143 142 143 144 141  K 3.3* 4.1 4.1 3.5* 3.5* 3.2* 3.9  CL 103 107 106 102 97 100 97  CO2 25 27 27 30 31 31 31   GLUCOSE 131* 102* 128* 92 110* 100* 159*  BUN 18 12 18 13 14 18 18   CREATININE 1.07 0.70 0.73 0.77 0.73 0.81 0.87  CALCIUM 7.8* 8.1* 8.6 8.5 9.1 9.1 8.9  MG 2.7* 2.1  --   --   --  1.9 1.9  PHOS 3.3 2.2*  --   --   --   --   --  Cardiac Enzymes: No results found for this basename: CKTOTAL, CKMB, CKMBINDEX, TROPONINI,  in the last 72 hours CBC:  Recent Labs Lab 05/09/14 0358 05/10/14 1000 05/11/14 0400 05/12/14 0344  WBC 10.5 7.6 6.1 6.0  HGB 11.8* 10.8* 9.8* 9.9*  HCT 35.6* 33.3* 30.6* 30.7*  MCV 85.6 87.2 87.9 88.2  PLT 106* 93* 97* 110*   PROTIME: No results found for this basename: LABPROT, INR,  in the last 72 hours Liver Function Tests: No results found for this basename: AST, ALT, ALKPHOS, BILITOT, PROT, ALBUMIN,  in the last 72 hours No results found for this basename: LIPASE, AMYLASE,  in the last 72 hours BNP: BNP (last 3 results)  Recent Labs  05/12/14 0344  PROBNP 3295.0*      ASSESSMENT AND PLAN:  Active Problems:   Acute pulmonary edema   Hypotension, unspecified   Acute respiratory failure   Altered mental status   HTN (hypertension), malignant   Acute on chronic systolic congestive heart failure, NYHA class 4   Cardiomyopathy, ischemic   LBBB (left bundle branch  block)   Thrombocytopathia   Hypokalemia   Pulmonary edema  The patient is vastly improved. We'll continue to progress his medical therapy. We'll consult cardiac rehabilitation.  His ejection fraction was 35% and as such, don't think a LifeVEst is necessary. There was discrepancy between the echo and the catheterization.  Signed, Sherryl Manges MD  05/15/2014

## 2014-05-16 LAB — BASIC METABOLIC PANEL
ANION GAP: 11 (ref 5–15)
BUN: 20 mg/dL (ref 6–23)
CHLORIDE: 98 meq/L (ref 96–112)
CO2: 31 meq/L (ref 19–32)
CREATININE: 0.9 mg/dL (ref 0.50–1.35)
Calcium: 8.9 mg/dL (ref 8.4–10.5)
GFR calc Af Amer: >90 mL/min (ref >90)
GFR calc non Af Amer: 84 mL/min — ABNORMAL LOW (ref >90)
GLUCOSE: 96 mg/dL (ref 70–99)
Potassium: 4.2 mEq/L (ref 3.7–5.3)
Sodium: 140 mEq/L (ref 137–147)

## 2014-05-16 LAB — GLUCOSE, CAPILLARY
Glucose-Capillary: 120 mg/dL — ABNORMAL HIGH (ref 70–99)
Glucose-Capillary: 89 mg/dL (ref 70–99)

## 2014-05-16 LAB — MAGNESIUM: MAGNESIUM: 2 mg/dL (ref 1.5–2.5)

## 2014-05-16 MED ORDER — CARVEDILOL 12.5 MG PO TABS
12.5000 mg | ORAL_TABLET | Freq: Two times a day (BID) | ORAL | Status: DC
  Administered 2014-05-16 – 2014-05-18 (×6): 12.5 mg via ORAL
  Filled 2014-05-16 (×7): qty 1

## 2014-05-16 MED ORDER — IPRATROPIUM-ALBUTEROL 0.5-2.5 (3) MG/3ML IN SOLN
3.0000 mL | RESPIRATORY_TRACT | Status: DC | PRN
  Administered 2014-05-17: 3 mL via RESPIRATORY_TRACT
  Filled 2014-05-16: qty 3

## 2014-05-16 NOTE — Progress Notes (Signed)
Patient alert and oriented x4 throughout shift, vital signs stable.  Patient medicated for PRN Tylenol for throat pain to good effect.  Consent form for EP procedure on Tuesday signed and in chart.  Wife at bedside this evening.  Patient ambulated in hall ~431ft with standby assist and rolling walker, denying SOB or pain.  Patient and wife deny any questions or concerns at this time.  Will continue to monitor.

## 2014-05-16 NOTE — Plan of Care (Signed)
Problem: Phase II Progression Outcomes Goal: Walk in hall or up in chair TID Outcome: Completed/Met Date Met:  05/16/14 Walked in hall with RN
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
## 2014-05-16 NOTE — Progress Notes (Signed)
Patient had 14 beat run of v-tach, asymptomatic.  Vital signs stable.  Patient sitting up in bed talking with family members.  Per MD, continue to monitor.

## 2014-05-16 NOTE — Progress Notes (Signed)
Patient had 21 beat run of V-tach, asymptomatic.  Vital signs stable.  Dr. Johney Frame notified.  Patient also stating he has had some difficulty swallowing solid foods recently.  Dr. Johney Frame gave verbal order for SLP evaluation.  Order placed, patient made aware.  Will continue to monitor.

## 2014-05-16 NOTE — Progress Notes (Signed)
The patient had a 21 beat of wide QRS complex tachycardia.  He stated that he felt his chest "tighten" at the time that this was happening, but denies this feeling now.  His VS are currently stable and are in Epic.  The MD on call was notified.  New orders were given to have a BMET and magnesium level drawn in the morning.

## 2014-05-17 LAB — GLUCOSE, CAPILLARY
GLUCOSE-CAPILLARY: 96 mg/dL (ref 70–99)
Glucose-Capillary: 92 mg/dL (ref 70–99)
Glucose-Capillary: 91 mg/dL (ref 70–99)
Glucose-Capillary: 96 mg/dL (ref 70–99)

## 2014-05-17 LAB — CBC
HCT: 36.2 % — ABNORMAL LOW (ref 39.0–52.0)
HEMOGLOBIN: 11.8 g/dL — AB (ref 13.0–17.0)
MCH: 28.3 pg (ref 26.0–34.0)
MCHC: 32.6 g/dL (ref 30.0–36.0)
MCV: 86.8 fL (ref 78.0–100.0)
PLATELETS: 230 10*3/uL (ref 150–400)
RBC: 4.17 MIL/uL — AB (ref 4.22–5.81)
RDW: 13.8 % (ref 11.5–15.5)
WBC: 5.8 10*3/uL (ref 4.0–10.5)

## 2014-05-17 LAB — BASIC METABOLIC PANEL
Anion gap: 11 (ref 5–15)
BUN: 23 mg/dL (ref 6–23)
CO2: 29 mEq/L (ref 19–32)
Calcium: 8.9 mg/dL (ref 8.4–10.5)
Chloride: 99 mEq/L (ref 96–112)
Creatinine, Ser: 0.95 mg/dL (ref 0.50–1.35)
GFR calc Af Amer: >90 mL/min (ref >90)
GFR calc non Af Amer: 82 mL/min — ABNORMAL LOW (ref >90)
GLUCOSE: 93 mg/dL (ref 70–99)
POTASSIUM: 4.5 meq/L (ref 3.7–5.3)
SODIUM: 139 meq/L (ref 137–147)

## 2014-05-17 LAB — MAGNESIUM: MAGNESIUM: 2.1 mg/dL (ref 1.5–2.5)

## 2014-05-17 MED ORDER — CHLORHEXIDINE GLUCONATE 4 % EX LIQD: 60.0000 mL | Freq: Once | CUTANEOUS | Status: DC

## 2014-05-17 MED ORDER — SODIUM CHLORIDE 0.9 % IJ SOLN
10.0000 mL | Freq: Two times a day (BID) | INTRAMUSCULAR | Status: DC
  Administered 2014-05-18 (×2): 10 mL

## 2014-05-17 MED ORDER — NAPHAZOLINE HCL 0.1 % OP SOLN
1.0000 [drp] | Freq: Four times a day (QID) | OPHTHALMIC | Status: DC | PRN
  Filled 2014-05-17: qty 15

## 2014-05-17 MED ORDER — SODIUM CHLORIDE 0.9 % IJ SOLN
10.0000 mL | INTRAMUSCULAR | Status: DC | PRN
  Administered 2014-05-17: 10 mL
  Administered 2014-05-17: 30 mL
  Administered 2014-05-18: 10 mL

## 2014-05-17 MED ORDER — SODIUM CHLORIDE 0.9 % IV SOLN
INTRAVENOUS | Status: DC
  Administered 2014-05-17: 22:00:00 via INTRAVENOUS

## 2014-05-17 MED ORDER — CHLORHEXIDINE GLUCONATE CLOTH 2 % EX PADS: 6.0000 | MEDICATED_PAD | Freq: Once | CUTANEOUS | Status: DC

## 2014-05-17 MED ORDER — MOMETASONE FURO-FORMOTEROL FUM 100-5 MCG/ACT IN AERO
2.0000 | INHALATION_SPRAY | Freq: Two times a day (BID) | RESPIRATORY_TRACT | Status: DC
  Administered 2014-05-17 – 2014-05-18 (×3): 2 via RESPIRATORY_TRACT
  Filled 2014-05-17: qty 8.8

## 2014-05-17 MED ORDER — CHLORHEXIDINE GLUCONATE CLOTH 2 % EX PADS
6.0000 | MEDICATED_PAD | Freq: Once | CUTANEOUS | Status: AC
  Administered 2014-05-17: 6 via TOPICAL

## 2014-05-17 MED ORDER — SPIRONOLACTONE 12.5 MG HALF TABLET
12.5000 mg | ORAL_TABLET | Freq: Every day | ORAL | Status: DC
  Administered 2014-05-17 – 2014-05-18 (×2): 12.5 mg via ORAL
  Filled 2014-05-17 (×3): qty 1

## 2014-05-17 NOTE — Progress Notes (Signed)
   Subjective:  No chest pain; mild shortness of breath.  Objective:   Vital Signs in the last 24 hours: Temp:  [98.5 F (36.9 C)-98.9 F (37.2 C)] 98.5 F (36.9 C) (09/07 0557) Pulse Rate:  [53-73] 53 (09/07 0557) Resp:  [17-20] 17 (09/07 0557) BP: (108-144)/(53-72) 144/63 mmHg (09/07 0557) SpO2:  [96 %-100 %] 96 % (09/07 0557) Weight:  [136 lb 14.5 oz (62.1 kg)] 136 lb 14.5 oz (62.1 kg) (09/07 0557)  Intake/Output from previous day: 09/06 0701 - 09/07 0700 In: 1680 [P.O.:1680] Out: 1475 [Urine:1475]  I/O since admission: -4825  Weight 136 today;  ?165 (probably error) on admission vs 145.  Medications: . antiseptic oral rinse  7 mL Mouth Rinse BID  . aspirin  81 mg Oral Daily  . atorvastatin  10 mg Oral q1800  . carvedilol  12.5 mg Oral BID WC  . clopidogrel  75 mg Oral Daily  . enoxaparin (LOVENOX) injection  40 mg Subcutaneous Q24H  . feeding supplement (ENSURE COMPLETE)  237 mL Oral BID BM  . furosemide  40 mg Intravenous Q12H  . losartan  50 mg Oral BID  . nitroGLYCERIN  0.3 mg Transdermal Daily  . potassium chloride  40 mEq Oral BID  . sodium chloride  10-40 mL Intracatheter Q12H  . sodium chloride  10-40 mL Intracatheter Q12H       Physical Exam:   General appearance: alert, cooperative and no distress Neck: no adenopathy, no JVD, supple, symmetrical, trachea midline and thyroid not enlarged, symmetric, no tenderness/mass/nodules Lungs: clear to auscultation bilaterally Heart: regular rate and rhythm and 1/6 sem Abdomen: soft, non-tender; bowel sounds normal; no masses,  no organomegaly Neurologic: Grossly normal   Rate: 78  Rhythm: normal sinus rhythm  Lab Results:   Recent Labs  05/16/14 0431 05/17/14 0437  NA 140 139  K 4.2 4.5  CL 98 99  CO2 31 29  GLUCOSE 96 93  BUN 20 23  CREATININE 0.90 0.95    No results found for this basename: TROPONINI, CK, MB,  in the last 72 hours  Hepatic Function Panel No results found for this  basename: PROT, ALBUMIN, AST, ALT, ALKPHOS, BILITOT, BILIDIR, IBILI,  in the last 72 hours No results found for this basename: INR,  in the last 72 hours  BNP (last 3 results)  Recent Labs  05/12/14 0344  PROBNP 3295.0*    Lipid Panel     Component Value Date/Time   TRIG 71 05/08/2014 0105      Imaging:  No results found.    Assessment/Plan:   Active Problems:   HTN (hypertension), malignant   Cardiomyopathy, ischemic   LBBB (left bundle branch block)   Thrombocytopathia   Pulmonary edema   VT (ventricular tachycardia) non sustained   Breathing better. Discordant data with EF 35 % vs 25%. EPS for risk stratification on Tuesday. Will add spironolactone at 12.5 mg daily initially.   Lennette Bihari, MD, [facility] 05/17/2014, 9:08 AM

## 2014-05-17 NOTE — Evaluation (Signed)
Clinical/Bedside Swallow Evaluation Patient Details  Name: [name] MRN: [id number] Date of Birth: Feb 17, 1941  Today's Date: 05/17/2014 Time: 0818-0829 SLP Time Calculation (min): 11 min  Past Medical History:  Past Medical History  Diagnosis Date  . Acute on chronic systolic congestive heart failure, NYHA class 4 05/08/2014  . Cardiomyopathy, ischemic 05/08/2014  . 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 LBBB (left bundle branch block) 05/08/2014   Past Surgical History: History reviewed. No pertinent past surgical history. HPI:  73 yo male with hPMH:  HTN, HLD, left bundle branch bloxk, systolic dysfunction,  CAD admitted after developing severe chest pain and SOB and collapsed in parking lot.  Intubated 8/29- 9/2.  CXR 8/31 mild lung base opacities; atelectasis versus infiltrate and repeat Improved aeration of the lung bases may reflect resolving atelectasis or infiltrate.  Pt. complained of odnophagia and stated "think it is from that tube in my throat but it is better now."   Assessment / Plan / Recommendation Clinical Impression  Pt. demonstrated functional oral and pharyngeal phases of swallow without evidence of dysfunction.  Oral prep, mastication and transit are WFL's.  SLP educated pt. on effects of mechanical ventilation in regards to the pharynx/larynx that likely led to pt's globus sensation.  Continue regular texture and thin liquids and no further ST.    Aspiration Risk  Mild    Diet Recommendation Regular;Thin liquid   Liquid Administration via: Cup;Straw Medication Administration: Whole meds with liquid Supervision: Patient able to self feed Compensations: Slow rate;Small sips/bites Postural Changes and/or Swallow Maneuvers: Seated upright 90 degrees    Other  Recommendations Oral Care Recommendations: Oral care BID   Follow Up Recommendations  None    Frequency and Duration        Pertinent Vitals/Pain WDL         Swallow Study          Oral/Motor/Sensory Function Overall Oral Motor/Sensory Function: Appears within functional limits for tasks assessed   Ice Chips Ice chips: Not tested   Thin Liquid Thin Liquid: Within functional limits Presentation: Cup;Straw    Nectar Thick Nectar Thick Liquid: Not tested   Honey Thick Honey Thick Liquid: Not tested   Puree Puree: Not tested   Solid   GO    Solid: Within functional limits       [name] 05/17/2014,8:50 AM Breck Coons Lonell Face.Ed [employer] [phone]

## 2014-05-17 NOTE — Progress Notes (Signed)
Patient alarmed 7 beats v-tach.  Patient asymptomatic.  Will continue to monitor.

## 2014-05-18 DIAGNOSIS — I472 Ventricular tachycardia: Secondary | ICD-10-CM

## 2014-05-18 DIAGNOSIS — R079 Chest pain, unspecified: Secondary | ICD-10-CM

## 2014-05-18 DIAGNOSIS — I4729 Other ventricular tachycardia: Secondary | ICD-10-CM

## 2014-05-18 DIAGNOSIS — I2510 Atherosclerotic heart disease of native coronary artery without angina pectoris: Secondary | ICD-10-CM

## 2014-05-18 DIAGNOSIS — R092 Respiratory arrest: Secondary | ICD-10-CM

## 2014-05-18 HISTORY — DX: Respiratory arrest: R09.2

## 2014-05-18 LAB — BASIC METABOLIC PANEL
Anion gap: 10 (ref 5–15)
BUN: 21 mg/dL (ref 6–23)
CALCIUM: 8.8 mg/dL (ref 8.4–10.5)
CO2: 29 meq/L (ref 19–32)
CREATININE: 0.97 mg/dL (ref 0.50–1.35)
Chloride: 100 mEq/L (ref 96–112)
GFR calc Af Amer: >90 mL/min (ref >90)
GFR, EST NON AFRICAN AMERICAN: 82 mL/min — AB (ref >90)
GLUCOSE: 80 mg/dL (ref 70–99)
Potassium: 4.6 mEq/L (ref 3.7–5.3)
SODIUM: 139 meq/L (ref 137–147)

## 2014-05-18 LAB — GLUCOSE, CAPILLARY: Glucose-Capillary: 93 mg/dL (ref 70–99)

## 2014-05-18 MED ORDER — CARVEDILOL 12.5 MG PO TABS: 12.5000 mg | ORAL_TABLET | Freq: Two times a day (BID) | ORAL | Status: DC

## 2014-05-18 MED ORDER — SPIRONOLACTONE 12.5 MG HALF TABLET: 12.5000 mg | ORAL_TABLET | Freq: Every day | ORAL | Status: DC

## 2014-05-18 MED ORDER — LOSARTAN POTASSIUM 50 MG PO TABS: 50.0000 mg | ORAL_TABLET | Freq: Two times a day (BID) | ORAL | Status: DC

## 2014-05-18 MED ORDER — FUROSEMIDE 40 MG PO TABS: 40.0000 mg | ORAL_TABLET | Freq: Two times a day (BID) | ORAL | Status: DC

## 2014-05-18 NOTE — Progress Notes (Addendum)
Pt had asymptomatic 9 beat run of V-tach.  Pt is stable in no signs of distress, lying comfortably in bed.

## 2014-05-18 NOTE — Progress Notes (Signed)
Patient had a 5 beat run of V-Tach. Patient is in bed sleeping at this present time. No signs of distress or discomfort noted. Will continue to monitor patient for further changes in conditions.

## 2014-05-18 NOTE — Progress Notes (Deleted)
Pt had asymptomatic 9-beat run of V-tach.  BP 144/53.  HR 53.  Pt stable.

## 2014-05-18 NOTE — Progress Notes (Signed)
Reported to PM nurse.  Pt resting in bed after PICC removal via PICC team.  Waiting to be discharged home.

## 2014-05-18 NOTE — Progress Notes (Signed)
Discharge instruction given to pt, verbalized understanding and no questions asked. Denies any chest pain, not in respiratory distress, denies nausea and vomiting, PICC line site is clean,dry and intact, no signs of bleeding noted.Pt was assisted by the Nurse Tech Katrina per wheelchair.

## 2014-05-18 NOTE — Discharge Instructions (Signed)
Acute Respiratory Failure °Respiratory failure is when your lungs are not working well and your breathing (respiratory) system fails. When respiratory failure occurs, it is difficult for your lungs to get enough oxygen, get rid of carbon dioxide, or both. Respiratory failure can be life threatening.  °Respiratory failure can be acute or chronic. Acute respiratory failure is sudden, severe, and requires emergency medical treatment. Chronic respiratory failure is less severe, happens over time, and requires ongoing treatment.  °WHAT ARE THE CAUSES OF ACUTE RESPIRATORY FAILURE?  °Any problem affecting the heart or lungs can cause acute respiratory failure. Some of these causes include the following: °· Chronic bronchitis and emphysema (COPD).   °· Blood clot going to a lung (pulmonary embolism).   °· Having water in the lungs caused by heart failure, lung injury, or infection (pulmonary edema).   °· Collapsed lung (pneumothorax).   °· Pneumonia.   °· Pulmonary fibrosis.   °· Obesity.   °· Asthma.   °· Heart failure.   °· Any type of trauma to the chest that can make breathing difficult.   °· Nerve or muscle diseases making chest movements difficult. °WHAT SYMPTOMS SHOULD YOU WATCH FOR?  °If you have any of these signs or symptoms, you should seek immediate medical care:  °· You have shortness of breath (dyspnea) with or without activity.   °· You have rapid, fast breathing (tachypnea).   °· You are wheezing. °· You are unable to say more than a few words without having to catch your breath. °· You find it very difficult to function normally. °· You have a fast heart rate.   °· You have a bluish color to your finger or toe nail beds.   °· You have confusion or drowsiness or both.   °HOW WILL MY ACUTE RESPIRATORY FAILURE BE TREATED?  °Treatment of acute respiratory failure depends on the cause of the respiratory failure. Usually, you will stay in the intensive care unit so your breathing can be watched closely. Treatment  can include the following: °· Oxygen. Oxygen can be delivered through the following: °¨ Nasal cannula. This is small tubing that goes in your nose to give you oxygen. °¨ Face mask. A face mask covers your nose and mouth to give you oxygen. °· Medicine. Different medicines can be given to help with breathing. These can include: °¨ Nebulizers. Nebulizers deliver medicines to open the air passages (bronchodilators). These medicines help to open or relax the airways in the lungs so you can breathe better. They can also help loosen mucus from your lungs. °¨ Diuretics. Diuretic medicines can help you breathe better by getting rid of extra water in your body. °¨ Steroids. Steroid medicines can help decrease swelling (inflammation) in your lungs. °¨ Antibiotics. °· Chest tube. If you have a collapsed lung (pneumothorax), a chest tube is placed to help reinflate the lung. °· Non-invasive positive pressure ventilation (NPPV). This is a tight-fitting mask that goes over your nose and mouth. The mask has tubing that is attached to a machine. The machine blows air into the tubing, which helps to keep the tiny air sacs (alveoli) in your lungs open. This machine allows you to breathe on your own. °· Ventilator. A ventilator is a breathing machine. When on a ventilator, a breathing tube is put into the lungs. A ventilator is used when you can no longer breathe well enough on your own. You may have low oxygen levels or high carbon dioxide (CO2) levels in your blood. When you are on a ventilator, sedation and pain medicines are given to make you sleep   so your lungs can heal. Document Released: 09/01/2013 Document Revised: 01/11/2014 Document Reviewed: 09/01/2013 [facility] Patient Information 2015 [place], [place]. This information is not intended to replace advice given to you by your health care provider. Make sure you discuss any questions you have with your health care provider.  Check your weight every single morning, if  increase by more than 3 lbs overnight or more than 5lbs in a single week, please give [place] a call. Keep low salt diet as high salt intake can worsen heart failure. Keep fluid intake to less than 2 L. If having increasing SOB, take 1 extra dose of morning lasix, if continue to have SOB, need to seek medical attention.

## 2014-05-18 NOTE — Progress Notes (Signed)
[id number] Observed pt up with PT so could write appropriate walking instructions. Pt had CHF booklet so I reviewed zones and reviewed 2000mg  sodium restriction and 2 L fluid restriction. Pt could recite when to call MD with weight gain and swelling. Work schedule not conducive to CRP 2 per pt as he works during day. Has viewed lifevest video. Gave walking instructions. Pt voiced understanding. Luetta Nutting RN BSN 05/18/2014 2:28 PM

## 2014-05-18 NOTE — Progress Notes (Signed)
Co-signed for Sabrina Lewis, RN/BSN for medication administration, assessments, Vital signs, I's & O's, progress notes, care plans, and patient education. Avon Molock M, RN/BSN  

## 2014-05-18 NOTE — Progress Notes (Signed)
Physical Therapy Treatment Patient Details Name: [name] MRN: [id number] DOB: 04-Jul-1941 Today's Date: 05/18/2014    History of Present Illness [name] is a 73 yo male with h/o HTN, HLD, noncompliance, known LBBB, Systolic dysfunction with EF 40% and known CAD.  He developed severe chest pain and SOB in a parking lot, at hospital diagnosed with STEMI  and pulm edema.   PMHx:  HLD, COPD, chronic back pain.    PT Comments    Pt progressing towards physical therapy goals. Was able to ambulate in the halls without AD and no LOB noted. One LOB during stair training, in which pt required assist to recover. Overall pt functionally improving, and states he has DME at home (belongs to his wife) that he could use if needed. Will continue to follow and progress as able per POC.   Follow Up Recommendations  Home health PT;Supervision - Intermittent     Equipment Recommendations  None recommended by PT    Recommendations for Other Services       Precautions / Restrictions Precautions Precautions: Fall Restrictions Weight Bearing Restrictions: No    Mobility  Bed Mobility Overal bed mobility: Independent             General bed mobility comments: No use of railings for support, no increased time or physical assist required.   Transfers Overall transfer level: Modified independent Equipment used: None             General transfer comment: Pt dmeonstrated proper hand placement on seated surface for safety.   Ambulation/Gait Ambulation/Gait assistance: Min guard Ambulation Distance (Feet): 400 Feet Assistive device: Rolling walker (2 wheeled);None Gait Pattern/deviations: WFL(Within Functional Limits)   Gait velocity interpretation: at or above normal speed for age/gender General Gait Details: Pt ambulating with RW initially, and therapist encouraged pt to try without the AD as he was doing well. Without the RW pt was steady and gait speed did not decrease. Recommended  that pt not use RW as he does not need it to improve his function.    Stairs Stairs: Yes Stairs assistance: Min assist Stair Management: No rails;Forwards;Alternating pattern Number of Stairs: 8 General stair comments: Pt ascended and descended 4 steps x2. Upon first attempt, posterior loss of balance while turning around to descend back down the steps. Assist required to recover. Upon second attempt no LOB noted and no physical assist required.   Wheelchair Mobility    Modified Rankin (Stroke Patients Only)       Balance Overall balance assessment: Needs assistance Sitting-balance support: Feet supported;No upper extremity supported Sitting balance-Leahy Scale: Good     Standing balance support: No upper extremity supported;During functional activity Standing balance-Leahy Scale: Fair                      Cognition Arousal/Alertness: Awake/alert Behavior During Therapy: WFL for tasks assessed/performed Overall Cognitive Status: Within Functional Limits for tasks assessed                      Exercises General Exercises - Lower Extremity Long Arc Quad: 10 reps;Both Hip ABduction/ADduction: 10 reps;Both;Standing Mini-Sqauts: 10 reps;Standing    General Comments        Pertinent Vitals/Pain Pain Assessment: No/denies pain    Home Living                      Prior Function  PT Goals (current goals can now be found in the care plan section) Acute Rehab PT Goals Patient Stated Goal: to walk alone and go home PT Goal Formulation: With patient Time For Goal Achievement: 05/22/14 Potential to Achieve Goals: Good Progress towards PT goals: Progressing toward goals    Frequency  Min 3X/week    PT Plan Current plan remains appropriate    Co-evaluation             End of Session Equipment Utilized During Treatment: Gait belt Activity Tolerance: Patient tolerated treatment well Patient left: Other (comment) (Sitting EOB  with RN and cardiac rehab present in room)     Time: 0102-7253 PT Time Calculation (min): 23 min  Charges:  $Gait Training: 8-22 mins $Therapeutic Exercise: 8-22 mins                    G Codes:      Ruthann Cancer 04-Jun-2014, 2:10 PM  Ruthann Cancer, PT, DPT Acute Rehabilitation Services Pager: [phone]

## 2014-05-18 NOTE — Discharge Summary (Signed)
Discharge Summary   Patient ID: [name],  MRN: [id number], DOB/AGE: September 21, 1940 73 y.o.  Admit date: 05/08/2014 Discharge date: 05/18/2014  Primary Care Provider: Rene Paci Primary Cardiologist: Dr. Swaziland Primary Electrophysiologist: Dr. Johney Frame  Discharge Diagnoses Principal Problem:   Respiratory arrest  - no change in cath 05/08/2014, felt to be due to respiratory arrest and pulm edema  Active Problems:   HTN (hypertension), malignant   Cardiomyopathy, ischemic   LBBB (left bundle branch block)   Thrombocytopathia   Pulmonary edema   VT (ventricular tachycardia) non sustained   CAD (coronary artery disease)   Allergies Allergies  Allergen Reactions  . Aspirin Other (See Comments)    GI upset    Procedures  Echocardiogram 05/08/2014  LV EF: 20% - 25%  ------------------------------------------------------------------- History: PMH: Acute pulmonary edema. Post cardiac cath.  ------------------------------------------------------------------- Study Conclusions  - Left ventricle: The cavity size was normal. Wall thickness was increased in a pattern of severe LVH. Systolic function was severely reduced. The estimated ejection fraction was in the range of 20% to 25%. Severe global hypokinesis. There was an increased relative contribution of atrial contraction to ventricular filling. Doppler parameters are consistent with abnormal left ventricular relaxation (grade 1 diastolic dysfunction). Doppler parameters are consistent with high ventricular filling pressure. - Ventricular septum: Septal motion showed abnormal function and dyssynergy. These changes are consistent with a left bundle branch block. - Aortic valve: Mildly calcified annulus. Trileaflet; mildly thickened leaflets. - Mitral valve: Moderately thickened leaflets . - Right ventricle: Systolic function was mildly reduced. - Pulmonary arteries: PA peak pressure: 38 mm Hg (S). - Pericardium,  extracardiac: A small pericardial effusion was identified. No evidence of hemodynamic sequelae.      Cardiac catheterization 05/08/2014  Procedural Findings:  Hemodynamics:  AO 108/61  LV 106/14  Coronary angiography:  Coronary dominance: right  Left mainstem: The left main is widely patent with no obstruction.  Left anterior descending (LAD): The LAD is a large caliber vessel wrapping around the left ventricular apex. The proximal LAD has a long area of mild stenosis estimated at approximately 40-50% at most. The first diagonal has a subtotal stenosis of 95-99%. It originates at acute angle from the LAD and is small in caliber.  Left circumflex (LCx): The left circumflex supplies a large intermediate branch with a 30-40% stenosis. The AV circumflex extends down and is patent. However, the first obtuse marginal is chronically occluded with left to left collaterals.  Right coronary artery (RCA): Dominant vessel. The proximal vessel has 30-40% stenosis. The mid vessel is stented and the stented segment is widely patent. The distal RCA is tortuous. The PDA and PLA branches are patent.  Left ventriculography: The basal and midinferior wall is akinetic. The other LV wall segments are diffusely hypokinetic. The estimated LVEF is 35%. There is mild mitral regurgitation.  Contrast: 75 cc Omnipaque  Radiation dose/Fluoro time: 3.6 minutes  Estimated Blood Loss: Minimal  Final Conclusions:  1. Coronary artery disease with nonobstructive LAD stenosis, severe diagonal stenosis, total occlusion of the first OM branch, and patency of the RCA stented segment  2. Severe segmental LV systolic dysfunction  3. Normal LVEDP after administration of IV nitroglycerin and IV beta blocker  4. Suspected respiratory failure from acute pulmonary edema/hypertensive emergency  Recommendations: The patient's coronary anatomy is unchanged from previous studies. He will be treated medically. His respiratory status is  tenuous at present and despite mechanical ventilation on 100% FiO2, he remains severely hypoxemic. CCM is present and managing  the patient's respiratory status. Appreciate their assistance in his care.     Hospital Course  The patient is a 73 year old African American male with past medical history of hypertension, HLD, medical noncompliance, chronic left bundle branch block, and history of systolic dysfunction with EF 40%, and a history of coronary artery disease who presented to [name] on 05/08/2014 with respiratory arrest. Prior to symptom onset, he developed severe chest pain or shortness breath in the parking lot and asked a bystander to call 911 before collapse.   He was intubated in the ED secondary to altered mental status and respiratory failure. He had ventricular tachycardia for about 30 seconds during intubation and self converted. Code STEMI was activated and the patient was taken to the Cath Lab emergently. Cardiac catheterization on 05/08/2014 showed coronary artery disease with nonobstructive LAD stenosis, severe diagonal stenosis, total occlusion of the first OM branch, and RCA stent patent, patient had severe segmental LV systolic dysfunction and normal LVEDP. The result was unchanged from the previous cardiac catheterization. Medical management was recommended. It was suspected the patient's respiratory failure was from acute pulmonary edema and hypertensive emergency. During cardiac cath, patient appeared to be severely hypoxic while 100% FiO2 on mechanical ventilation. Pulmonary critical care team was consulted and right subclavian central line was placed on 05/08/2014 to help with IV medication and to check CVP.   During initial admission, patient had episodes of hypotension. Echocardiogram was obtained on 8/29 which showed EF 20-25%, severe global hypokinesis, grade 1 diastolic dysfunction, septal dyssynchrony consistent with left bundle branch block, mildly reduced RV systolic  function, PA peak pressure 38, small pericardial effusion was identified with no hemodynamic sequelae. On the night of 8/29, patient was noted to have some fever and a questionable focal left lower lobe infiltrate on x-ray, and emperic antibiotics was started for possible pneumonia. During the mean time, patient was also diuresed given his significant cardiomyopathy and pulmonary edema. His blood pressure eventually recovered, however was noted to have wide swings when agitated. He was eventually extubated on 05/11/2014 and pulmonary critical care was signed off on the following day after deemed the patient stable.  Post extubation, patient was diuresed further. A low-dose beta blocker was added to his medical regimen. ARB was added later as his blood pressure allows. Given his significant cardiomyopathy with left bundle branch block, it was recommended for possible resynchronization therapy in the future. Patient was transferred out of the ICU on 9/3. Patient was seen by electrophysiology team on 05/18/2014, he is deemed a candidate for biventricular ICD placement. However patient does not wish to proceed at current time and want to think about it. We will discharge him on lifevest to protect him from secondary cardiac event. He will followup with Dr. Johney Frame in 4 weeks at which time he will decide on whether or not he wished to have biventricular ICD placed at that time. Patient is deemed stable for discharge from cardiology perspective after Lifevest placement.   Of note, patient is currently on IV Lasix, I will discharge him on 40 mg twice a day of PO Lasix and instruct the patient to seek medical attention if his weight increased by more than 3 pounds overnight or 5 pounds in a single week. I have also instructed him to avoid high salt diet or excessive fluid intake greater than 2 L. Should he experience any worsening dyspnea, he is instructed to take an additional dose of morning Lasix, if dyspnea continues, he  should seek  medical attention. Per Dr. Johney Frame, I will set the patient up to see Dr. Johney Frame in 4 weeks. Given his prolonged hospitalization and severe cardiomyopathy, I will contact to schedule her to schedule the patient to followup with transition of care within one to 2 weeks as well. He will need BMET in one week given added Lasix.  Of note, although aspirin is listed as the patient's allergy, patient has been taking aspirin throughout the entire hospitalization without significant other direction. Given his deconditioning, we will also get advanced home health care to help patient after discharge.  Discharge Vitals Blood pressure 125/60, pulse 57, temperature 98.3 F (36.8 C), temperature source Oral, resp. rate 18, height 5\' 9"  (1.753 m), weight 139 lb 8.8 oz (63.3 kg), SpO2 98.00%.  Filed Weights   05/16/14 0520 05/17/14 0557 05/18/14 0557  Weight: 136 lb 1.6 oz (61.735 kg) 136 lb 14.5 oz (62.1 kg) 139 lb 8.8 oz (63.3 kg)    Labs  CBC  Recent Labs  05/17/14 0437  WBC 5.8  HGB 11.8*  HCT 36.2*  MCV 86.8  PLT 230   Basic Metabolic Panel  Recent Labs  05/16/14 0431 05/17/14 0437 05/18/14 0540  NA 140 139 139  K 4.2 4.5 4.6  CL 98 99 100  CO2 31 29 29   GLUCOSE 96 93 80  BUN 20 23 21   CREATININE 0.90 0.95 0.97  CALCIUM 8.9 8.9 8.8  MG 2.0 2.1  --     Disposition  Pt is being discharged home today in good condition.  Follow-up Plans & Appointments      Follow-up Information   Follow up with [facility]. (Registered Nurse and Physical Therapy Services to start within 24-48 hours of discharge)    Contact information:   [address] [place] [address] [phone]       Follow up with [facility]. (BMET in 1 week)    Specialty:  Cardiology   Contact information:   [address], [address] [place] [address] [phone]      Follow up with Hillis Range, MD. (Follow up with Dr. Johney Frame in 4 weeks,  office will call to schedule)    Specialty:  Cardiology   Contact information:   [address] [place] [address] [phone]       Follow up with [facility]. (Office will call to set transition of care follow up in 1-2 weeks)    Specialty:  Cardiology   Contact information:   [address] [place] [address] [phone]      Discharge Medications    Medication List    STOP taking these medications       hydrochlorothiazide 25 MG tablet  Commonly known as:  HYDRODIURIL      TAKE these medications       acetaminophen 500 MG tablet  Commonly known as:  TYLENOL  Take 500 mg by mouth every 6 (six) hours as needed for mild pain, fever or headache.     albuterol 108 (90 BASE) MCG/ACT inhaler  Commonly known as:  PROVENTIL HFA;VENTOLIN HFA  Inhale 1-2 puffs into the lungs every 4 (four) hours as needed for wheezing or shortness of breath.     aspirin EC 81 MG tablet  Take 81 mg by mouth daily.     atorvastatin 10 MG tablet  Commonly known as:  LIPITOR  Take 10 mg by mouth  daily.     carvedilol 12.5 MG tablet  Commonly known as:  COREG  Take 1 tablet (12.5 mg total) by mouth 2 (two) times daily with a meal.     clopidogrel 75 MG tablet  Commonly known as:  PLAVIX  Take 75 mg by mouth daily.     Fluticasone-Salmeterol 250-50 MCG/DOSE Aepb  Commonly known as:  ADVAIR  Inhale 1 puff into the lungs 2 (two) times daily.     furosemide 40 MG tablet  Commonly known as:  LASIX  Take 1 tablet (40 mg total) by mouth 2 (two) times daily.     HYDROcodone-acetaminophen 5-325 MG per tablet  Commonly known as:  NORCO/VICODIN  Take 1 tablet by mouth at bedtime as needed for severe pain.     losartan 50 MG tablet  Commonly known as:  COZAAR  Take 1 tablet (50 mg total) by mouth 2 (two) times daily.     nitroGLYCERIN 0.4 MG SL tablet  Commonly known as:  NITROSTAT  Place 0.4 mg under the tongue every 5 (five)  minutes as needed for chest pain.     potassium chloride SA 20 MEQ tablet  Commonly known as:  K-DUR,KLOR-CON  Take 40 mEq by mouth 2 (two) times daily.     spironolactone 12.5 mg Tabs tablet  Commonly known as:  ALDACTONE  Take 0.5 tablets (12.5 mg total) by mouth daily.     tiotropium 18 MCG inhalation capsule  Commonly known as:  SPIRIVA  Place 18 mcg into inhaler and inhale daily.        Outstanding Labs/Studies  BMET in 1 week  Duration of Discharge Encounter   Greater than 30 minutes including physician time.  Ramond Dial PA-C Pager: [phone] 05/18/2014, 6:51 PM   Hillis Range MD

## 2014-05-18 NOTE — Progress Notes (Addendum)
SUBJECTIVE: The patient is doing well today.  At this time, he denies chest pain, shortness of breath, or any new concerns.  Admitted 05-08-14 with acute respiratory failure, catheterization demonstrated nonobstructive disease of the LAD, severe diagonal stenosis, total occlusion of the first OM and patent RCA stent with severe LF dysfunction and normal LVEDP - unchanged from prior cath.  Intubated for several days, extubated 05-11-14.  Echo this admission demonstrated EF 20-25%, grade 1 diastolic dysfunction, severe global hypokinesis. Most recent EF 2013 40%.    Recommendations 12-2013 by Dr Swaziland were to discontinue coreg and change lisinopril to losartan after acute bronchospasm.  However, the patient continued to take both Coreg 3.125mg  twice daily and Lisinopril until 8-28 when he started to have increased shortness of breath.   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  He also states that he has occasional palpitations without dizziness or pre-syncope.   I/O -5.8L since admission, weight down 26 pounds.   CURRENT MEDICATIONS: . antiseptic oral rinse  7 mL Mouth Rinse BID  . aspirin  81 mg Oral Daily  . atorvastatin  10 mg Oral q1800  . carvedilol  12.5 mg Oral BID WC  . Chlorhexidine Gluconate Cloth  6 each Topical Once  . clopidogrel  75 mg Oral Daily  . feeding supplement (ENSURE COMPLETE)  237 mL Oral BID BM  . furosemide  40 mg Intravenous Q12H  . losartan  50 mg Oral BID  . mometasone-formoterol  2 puff Inhalation BID  . nitroGLYCERIN  0.3 mg Transdermal Daily  . potassium chloride  40 mEq Oral BID  . sodium chloride  10-40 mL Intracatheter Q12H  . sodium chloride  10-40 mL Intracatheter Q12H  . spironolactone  12.5 mg Oral Daily   . sodium chloride 50 mL/hr at 05/17/14 2219    OBJECTIVE: Physical Exam: Filed Vitals:   05/17/14 1500 05/17/14 2003 05/17/14 2118 05/18/14 0557  BP: 130/59  132/59 120/52  Pulse: 60  62 58  Temp: 98.9 F (37.2 C)  99.9 F  (37.7 C) 98.7 F (37.1 C)  TempSrc: Oral  Oral Oral  Resp: 18  18 18   Height:      Weight:    139 lb 8.8 oz (63.3 kg)  SpO2: 100% 89% 95% 97%    Intake/Output Summary (Last 24 hours) at 05/18/14 3335 Last data filed at 05/18/14 0604  Gross per 24 hour  Intake   1030 ml  Output   1925 ml  Net   -895 ml    Telemetry reveals sinus bradycardia, runs of NSVT (multifocal)  GEN- The patient is well appearing, alert and oriented x 3 today.   Head- normocephalic, atraumatic Eyes-  Sclera clear, conjunctiva pink Ears- hearing intact Oropharynx- clear Neck- supple,R CVL in place Lungs- Clear to ausculation bilaterally, normal work of breathing Heart- Regular rate and rhythm  GI- soft, NT, ND, + BS Extremities- no clubbing, cyanosis, or edema Neuro- strength and sensation are intact  LABS: Basic Metabolic Panel:  Recent Labs  45/62/56 0431 05/17/14 0437 05/18/14 0540  NA 140 139 139  K 4.2 4.5 4.6  CL 98 99 100  CO2 31 29 29   GLUCOSE 96 93 80  BUN 20 23 21   CREATININE 0.90 0.95 0.97  CALCIUM 8.9 8.9 8.8  MG 2.0 2.1  --    CBC:  Recent Labs  05/17/14 0437  WBC 5.8  HGB 11.8*  HCT 36.2*  MCV 86.8  PLT 230  RADIOLOGY: Ct Head Wo Contrast 05/08/2014   CLINICAL DATA:  Seizure activity.  EXAM: CT HEAD WITHOUT CONTRAST  TECHNIQUE: Contiguous axial images were obtained from the base of the skull through the vertex without intravenous contrast.  COMPARISON:  None.  FINDINGS: Mild diffuse cerebral atrophy. Low-attenuation changes in the deep white matter consistent with small vessel ischemia. No mass effect or midline shift. No abnormal extra-axial fluid collections. Gray-white matter junctions are distinct. Basal cisterns are not effaced. No evidence of acute intracranial hemorrhage. No depressed skull fractures. Retention cysts in the left maxillary antrum. Mucosal thickening in the ethmoid air cells. Mastoid air cells are not opacified. Vascular calcifications.   IMPRESSION: No acute intracranial abnormality.  Mild chronic atrophy.   Electronically Signed   By: Burman Nieves M.D.   On: 05/08/2014 04:41   Dg Chest Port 1 View 05/12/2014   CLINICAL DATA:  Follow-up respiratory failure  EXAM: PORTABLE CHEST - 1 VIEW  COMPARISON:  05/11/2014, 05/09/2014  FINDINGS: Prominent cardiomediastinal contours. Improved aeration of the lung bases with mild residual. Persistent right hemidiaphragm elevation. Small effusions not excluded. Interstitial prominence. No pneumothorax. Right subclavian catheter tip projects over the proximal SVC. No interval osseous change.  IMPRESSION: Improved aeration of the lung bases may reflect resolving atelectasis or infiltrate.  Central vascular congestion and interstitial prominence ; interstitial edema or atypical/ viral infection.   Electronically Signed   By: Jearld Lesch M.D.   On: 05/12/2014 06:34   ASSESSMENT AND PLAN:  Active Problems:   HTN (hypertension), malignant   Cardiomyopathy, ischemic   LBBB (left bundle branch block)   Thrombocytopathia   Pulmonary edema   VT (ventricular tachycardia) non sustained  The patient has an ischemic CM (EF 25%), NYHA Class III CHF, and CAD.  He also has a LBBB.  He has chronically depressed EF.  Despite optimal medical therapy, his EF remains depressed.  He also has frequent NSVT.   At this time, he meets MADIT II/ SCD-HeFT criteria for ICD implantation for primary prevention of sudden death.  Given LBBB, he may also benefit from CRT.  I am not convinced that EPS would be helpful in this situation.  Risks, benefits, alternatives to BiV ICD implantation were discussed in detail with the patient today. The patient  understands that the risks include but are not limited to bleeding, infection, pneumothorax, perforation, tamponade, vascular damage, renal failure, MI, stroke, death, inappropriate shocks, and lead dislodgement.  At this time, he does not wish to proceed.  He would prefer to  continue medical therapy and "Think about it".  I have offered him a lifevest.  He will wear this while contemplating ICD.  He will return to see me in 4 weeks for further discussion.  IF he declines BIV ICD at that time, I would discontinue lifevest.  Will discharge to home today.

## 2014-05-18 NOTE — Progress Notes (Signed)
Ordered video of Life Vest for pt to view.

## 2014-05-19 ENCOUNTER — Other Ambulatory Visit: Payer: Self-pay | Admitting: Physician Assistant

## 2014-05-19 ENCOUNTER — Telehealth: Payer: Self-pay | Admitting: *Deleted

## 2014-05-19 ENCOUNTER — Telehealth: Payer: Self-pay | Admitting: Internal Medicine

## 2014-05-19 DIAGNOSIS — I5021 Acute systolic (congestive) heart failure: Secondary | ICD-10-CM

## 2014-05-19 NOTE — Telephone Encounter (Signed)
Transition Care Management Follow-up Telephone Call  How have you been since you were released from the hospital? Pt states he has been doing ok his breathing is a lot better   Do you understand why you were in the hospital? {YES, pt states he understood why he was admitted  Do you understand the discharge instrcutions? {YES, pt understood instructions Items Reviewed:  Medications reviewed: YES, we went over medications confirm that he has stop taking the HCTZ. Pt state he want take HCTZ every again med make him disoriented, dizzy  Allergies reviewed: {YES, added HCTZ to allergy lise  Dietary changes reviewed: {YES, pt states no changes with his diet       Referrals reviewed: {YES, pt stated advance has contacted him for home care  Functional Questionnaire:   Activities of Daily Living (ADLs):   He states they are independent in the following: Pt states he is independent have some physical weakness, but been walking around  States they require assistance with the following: No assistance needed   Any transportation issues/concerns?: No issues with transportation   Any patient concerns?NO, pt concerns from pt    Confirmed importance and date/time of follow-up visits scheduled: {YES, made appt for TCM 05/24/14 @ 1:00pm with Dr. Alwyn Ren due to md no availabity, and pt is aware   Confirmed with patient if condition begins to worsen call PCP or go to the ER.  Patient was given the Call-a-Nurse line [phone]: YES

## 2014-05-19 NOTE — Telephone Encounter (Signed)
TCM pt per [place] PA, 1-2 week fu appt with Tereso Newcomer 05-25-14 1210pm/mt

## 2014-05-19 NOTE — Telephone Encounter (Signed)
Called pt to get him set-up for TCM. No answer LMOM RTC...Raechel Chute

## 2014-05-19 NOTE — Telephone Encounter (Signed)
Patient contacted regarding discharge from [facility] on 05/18/14.  Patient understands to follow up with provider Tereso Newcomer, PA,c  on 05/25/14  at 12:10PM at [address]. Patient understands discharge instructions? yes Patient understands medications and regiment? yes Patient understands to bring all medications to this visit? yes

## 2014-05-24 ENCOUNTER — Ambulatory Visit (INDEPENDENT_AMBULATORY_CARE_PROVIDER_SITE_OTHER): Payer: Medicare Other | Admitting: Internal Medicine

## 2014-05-24 ENCOUNTER — Encounter: Payer: Self-pay | Admitting: Internal Medicine

## 2014-05-24 ENCOUNTER — Other Ambulatory Visit (INDEPENDENT_AMBULATORY_CARE_PROVIDER_SITE_OTHER): Payer: Medicare Other

## 2014-05-24 VITALS — BP 130/66 | HR 49 | Temp 98.4°F | Wt 144.1 lb

## 2014-05-24 DIAGNOSIS — I472 Ventricular tachycardia, unspecified: Secondary | ICD-10-CM

## 2014-05-24 DIAGNOSIS — R092 Respiratory arrest: Secondary | ICD-10-CM

## 2014-05-24 DIAGNOSIS — I2589 Other forms of chronic ischemic heart disease: Secondary | ICD-10-CM

## 2014-05-24 DIAGNOSIS — I255 Ischemic cardiomyopathy: Secondary | ICD-10-CM

## 2014-05-24 DIAGNOSIS — Z9189 Other specified personal risk factors, not elsewhere classified: Secondary | ICD-10-CM

## 2014-05-24 DIAGNOSIS — I4729 Other ventricular tachycardia: Secondary | ICD-10-CM

## 2014-05-24 DIAGNOSIS — Z87898 Personal history of other specified conditions: Secondary | ICD-10-CM

## 2014-05-24 LAB — BASIC METABOLIC PANEL
BUN: 12 mg/dL (ref 6–23)
CALCIUM: 9.4 mg/dL (ref 8.4–10.5)
CO2: 28 meq/L (ref 19–32)
CREATININE: 0.9 mg/dL (ref 0.4–1.5)
Chloride: 104 mEq/L (ref 96–112)
GFR: 105.71 mL/min (ref >60.00)
Glucose, Bld: 69 mg/dL — ABNORMAL LOW (ref 70–99)
Potassium: 3.7 mEq/L (ref 3.5–5.1)
Sodium: 142 mEq/L (ref 135–145)

## 2014-05-24 NOTE — Progress Notes (Signed)
   Subjective:    Patient ID: [name], male    DOB: 1941-07-28, 73 y.o.   MRN: [id number]  HPI  Patient is seen today for post hospital follow-up.  He was admitted 05-08-14 through 05-18-14 with respiratory arrest.  While admitted, he underwent repeat catheterization which demonstrated stable ischemic cardiomyopathy with no change in anatomy, EF 20%.  He was intubated for a short time.  He was diuresed and discharged on Lasix and Spironolactone instead of HCTZ.  LifeVest was arranged prior to discharge for cardiomyopathy and ventricular tachycardia (the patient declined ICD implant at time of hospitalization).   Since discharge, he reports occasional PND in the setting of nightmares.  His wife reports snoring without apnea.  He also has DOE for which he is taking Albuterol.   He denies other CV symptoms. He has not had palpitations or frank syncope.   He has follow-up scheduled with cardiology tomorrow.  Pulmonary follow-up not yet arranged.    Review of Systems  Constitutional: Positive for appetite change (decreased appetite). Negative for fever, chills and activity change.  HENT: Negative.   Respiratory: Positive for shortness of breath (see HPI). Negative for cough and wheezing.   Cardiovascular: Negative for chest pain, palpitations and leg swelling.  Gastrointestinal: Negative for nausea, vomiting, abdominal pain, diarrhea, constipation and abdominal distention.  Skin: Negative for rash and wound.  Neurological: Negative for dizziness, syncope, weakness, light-headedness and headaches.       Objective:   Physical Exam  Constitutional: He is oriented to person, place, and time. He appears well-developed and well-nourished. No distress.  HENT:  Head: Normocephalic and atraumatic.  Right Ear: External ear normal.  Left Ear: External ear normal.  Nose: Nose normal.  Mouth/Throat: Oropharynx is clear and moist. No oropharyngeal exudate.  Posterior oropharyngeal crowding  Eyes:  Conjunctivae and EOM are normal. Pupils are equal, round, and reactive to light. Right eye exhibits no discharge. Left eye exhibits no discharge.  Neck: Normal range of motion. Neck supple. No thyromegaly present.  Cardiovascular: Regular rhythm.   No extrasystoles are present. Bradycardia present.   Exam limited by lifevest  Pulmonary/Chest: Effort normal and breath sounds normal. He has no wheezes.  Abdominal: Soft. Bowel sounds are normal. He exhibits no distension. There is no tenderness.  Musculoskeletal: Normal range of motion.  Lymphadenopathy:    He has no cervical adenopathy.  Neurological: He is alert and oriented to person, place, and time.  Skin: Skin is warm and dry. He is not diaphoretic.  Psychiatric: He has a normal mood and affect. His behavior is normal.             Assessment & Plan:

## 2014-05-24 NOTE — Progress Notes (Signed)
   Subjective:    Patient ID: [name], male    DOB: 19-Sep-1940, 73 y.o.   MRN: [id number]  HPI   His complex hospital record 8/29-05/18/14 was reviewed. He sustained a respiratory arrest without documented change in vascular disease on catheterization 05/08/14. This revealed diffuse coronary disease with nonobstructive LAD stenosis, severe diagonal stenosis and total occlusion of the first OM . The right coronary artery which had been stented was patent. It was felt that the the arrest was related to acute pulmonary edema in the setting of hypertensive emergency.  Other diagnoses include ischemic cardiomyopathy with associated left bundle branch block.  Ejection fraction on echocardiogram was 20-25%.  He had nonsustained ventricular tachycardia.  At the time of discharge HCTZ was discontinued.  BMET recommended one week following discharge.  The day of discharge chemistries were normal except GFR was minimally decreased @  82. Specifically creatinine 0.97.  He did exhibit a mild anemia with a hematocrit of 36.2. This was normocytic, normochromic. Apparently he did quit smoking right before he went to the hospital    Review of Systems  He does have exertional dyspnea and has been using his albuterol twice a day.  Some nocturnal dyspnea but this in the context of apparent nightmares. His wife does describe some snoring without definite apnea.  Chest pain, palpitations, tachycardia, claudication or edema are absent.       Objective:   Physical Exam    Positive pertinent findings include: Pattern alopecia is present. He has severe dental disease with scattered caries and erosions to the gumline or below. He has multiple missing teeth. The oropharynx is crowded with poor visualization Breath sounds are decreased. His cardiac vest limits auscultation. There is a grade 1.5 systolic murmur at the right base. Slight clubbing of the nailbeds is noted Dorsalis pedis pulses are  decreased.     Appears healthy and well-nourished & in no acute distress No carotid bruits are present.No neck pain distention present at 10 - 15 degrees. Thyroid normal to palpation. No NVD @ 15 degrees Heart rhythm and rate are normal with no gallop  No increased work of breathing There is no evidence of aortic aneurysm or renal artery bruits Abdomen soft with no organomegaly or masses. No HJR No  cyanosis or edema present. Pedal pulses are equal No ischemic skin changes are present . Fingernails healthy  Alert and oriented. Strength, tone, DTRs reflexes normal          Assessment & Plan:  #1 status post respiratory arrest attributed to pulmonary edema in the context of malignant hypertension  #2 ischemic cardiomyopathy  #3 history of smoking  #4 history of nonsustained ventricular tachycardia  #4 hx snoring; nocturnal dyspnea;crowded oropharynx. R/O sleep apnea  Plan: BMET as requested  #2 referral to pulmonary. I asked him to have his wife accompany him.  Additionally Xopenex may be a better bronchodilator than the albuterol because of history of nonsustained V. tach.

## 2014-05-24 NOTE — Progress Notes (Signed)
Pre visit review using our clinic review tool, if applicable. No additional management support is needed unless otherwise documented below in the visit note. 

## 2014-05-24 NOTE — Patient Instructions (Addendum)
Sleep apnea may present as a disturbed sleep pattern, significant fatigue, heart rhythm disturbance,or edema ( swelling of the extremities). Please see Dr Kendrick Fries for sleep apnea evaluation .  Your next office appointment will be determined based upon review of your pending labs . Those instructions will be transmitted to you by mail  Followup as needed for your acute issue. Please report any significant change in your symptoms.

## 2014-05-25 ENCOUNTER — Other Ambulatory Visit: Payer: Medicare Other

## 2014-05-25 ENCOUNTER — Encounter: Payer: Self-pay | Admitting: Physician Assistant

## 2014-05-25 ENCOUNTER — Telehealth: Payer: Self-pay

## 2014-05-25 ENCOUNTER — Ambulatory Visit (INDEPENDENT_AMBULATORY_CARE_PROVIDER_SITE_OTHER): Payer: Medicare Other | Admitting: Physician Assistant

## 2014-05-25 VITALS — BP 140/58 | HR 48 | Ht 69.0 in | Wt 144.0 lb

## 2014-05-25 DIAGNOSIS — I2510 Atherosclerotic heart disease of native coronary artery without angina pectoris: Secondary | ICD-10-CM

## 2014-05-25 DIAGNOSIS — I10 Essential (primary) hypertension: Secondary | ICD-10-CM

## 2014-05-25 DIAGNOSIS — I5042 Chronic combined systolic (congestive) and diastolic (congestive) heart failure: Secondary | ICD-10-CM | POA: Insufficient documentation

## 2014-05-25 DIAGNOSIS — E785 Hyperlipidemia, unspecified: Secondary | ICD-10-CM

## 2014-05-25 DIAGNOSIS — E782 Mixed hyperlipidemia: Secondary | ICD-10-CM | POA: Insufficient documentation

## 2014-05-25 DIAGNOSIS — I472 Ventricular tachycardia, unspecified: Secondary | ICD-10-CM

## 2014-05-25 DIAGNOSIS — I428 Other cardiomyopathies: Secondary | ICD-10-CM

## 2014-05-25 DIAGNOSIS — I2589 Other forms of chronic ischemic heart disease: Secondary | ICD-10-CM

## 2014-05-25 DIAGNOSIS — R0602 Shortness of breath: Secondary | ICD-10-CM

## 2014-05-25 DIAGNOSIS — I5022 Chronic systolic (congestive) heart failure: Secondary | ICD-10-CM

## 2014-05-25 DIAGNOSIS — I4729 Other ventricular tachycardia: Secondary | ICD-10-CM

## 2014-05-25 DIAGNOSIS — I429 Cardiomyopathy, unspecified: Secondary | ICD-10-CM

## 2014-05-25 MED ORDER — ISOSORBIDE MONONITRATE ER 30 MG PO TB24: 30.0000 mg | ORAL_TABLET | Freq: Every day | ORAL | Status: DC

## 2014-05-25 MED ORDER — CARVEDILOL 12.5 MG PO TABS: ORAL_TABLET | ORAL | Status: DC

## 2014-05-25 NOTE — Telephone Encounter (Signed)
Patient called no answer.Left message on personal voice mail I received a message you need a 2 month follow up with Dr.Jordan.[email or web address].

## 2014-05-25 NOTE — Progress Notes (Signed)
Cardiology Office Note    Date:  05/25/2014   ID:  [name], DOB [birth year], MRN [id number]  PCP:  Gwendolyn Grant, MD  Cardiologist:  Dr. Peter Martinique   Electrophysiologist:  Dr. Thompson Grayer    History of Present Illness: [name] is a 73 y.o. 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  Last seen by Dr. Peter Martinique in 12/2013.  He was admitted 8/29-9/8 with VDRF in the setting of acute pulmonary edema/HTN emergency.  He was intubated by EMS in the field with reported VTach x 30 sec during intubation >> self converted.  Emergent LHC demonstrated stable anatomy.  He remained intubated for several days.  He was on antibiotics to cover for possible pneumonia.  He had some bouts of diarrhea and CDiff was neg.  Patient was seen by EP.  He was noted to have frequent NSVT on Tele.  Initially EPS was considered.  However, Dr. Rayann Heman saw him and felt that he met criteria for ICD implantation for primary prevention of SCD.  CRT-D was recommended given low EF, LBBB.  Patient declined and was DC'd on LifeVest.  He returns for FU.    Since DC, he has been ok.  He does note what sounds like PND at night.  He is also have some insomnia.  He has been having some occasional L sided chest pain.  This comes and goes and is not related to exertion.  He denies significant DOE.  He is NYHA 2-2b.  He denies orthopnea, LE edema, syncope or near syncope.  His LifeVest has not alarmed or delivered a shock.  His weights have increased 4-5 lbs at home since DC.    Studies:  - LHC (8/15):  prox LAD 40-50%, D1 95-99%, Int Br of CFX 30-40%, OM1 with CTO (L>L collats), prox RCA 30-40%, mid RCA stent ok, EF 35%  - Echo (8/15):  Severe LVH, EF 20-25%, severe global HK, Gr 1 DD, mild reduced RVF, PASP 38 mmHg, small eff  - Nuclear  (3/13):  Normal perfusion, EF 40%    Recent Labs/Images: 05/11/2014: ALT 13  05/12/2014: Pro B Natriuretic peptide (BNP) 3295.0*  05/17/2014: Hemoglobin 11.8*  05/24/2014: Creatinine 0.9; Potassium 3.7   Wt Readings from Last 3 Encounters:  05/25/14 144 lb (65.318 kg)  05/24/14 144 lb 2 oz (65.375 kg)  05/18/14 139 lb 8.8 oz (63.3 kg)     Past Medical History  Diagnosis Date  . Chronic systolic heart failure     Echo (8/15):  Severe LVH, EF 20-25%, severe global HK, Gr 1 DD, mild reduced RVF, PASP 38 mmHg, small eff  . 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 LBBB (left bundle branch block) 05/08/2014  . CAD (coronary artery disease)     a. prior PCI to RCA;  b. Nuclear (3/13):  Normal perfusion, EF 40%;  c. LHC (8/15):  prox LAD 40-50%, D1 95-99%, Int Br of CFX 30-40%, OM1 with CTO (L>L collats), prox RCA 30-40%, mid RCA stent ok, EF 35%   . NSVT (nonsustained ventricular tachycardia)     LifeVest applied 04/2014 admission  . HLD (hyperlipidemia)     Current Outpatient Prescriptions  Medication Sig Dispense Refill  .  acetaminophen (TYLENOL) 500 MG tablet Take 500 mg by mouth every 6 (six) hours as needed for mild pain, fever or headache.      . albuterol (PROVENTIL HFA;VENTOLIN HFA) 108 (90 BASE) MCG/ACT inhaler Inhale 1-2 puffs into the lungs every 4 (four) hours as needed for wheezing or shortness of breath.      [name] aspirin EC 81 MG tablet Take 81 mg by mouth daily.      [name] atorvastatin (LIPITOR) 10 MG tablet Take 10 mg by mouth daily.      . carvedilol (COREG) 12.5 MG tablet Take 1 tablet (12.5 mg total) by mouth 2 (two) times daily with a meal.  60 tablet  11  . clopidogrel (PLAVIX) 75 MG tablet Take 75 mg by mouth daily.      . Fluticasone-Salmeterol (ADVAIR) 250-50 MCG/DOSE AEPB Inhale 1 puff into the lungs 2 (two) times daily.      . furosemide (LASIX) 40 MG tablet Take 1 tablet (40 mg total) by mouth 2 (two)  times daily.  60 tablet  3  . HYDROcodone-acetaminophen (NORCO/VICODIN) 5-325 MG per tablet Take 1 tablet by mouth at bedtime as needed for severe pain.      [name] losartan (COZAAR) 50 MG tablet Take 1 tablet (50 mg total) by mouth 2 (two) times daily.  60 tablet  6  . nitroGLYCERIN (NITROSTAT) 0.4 MG SL tablet Place 0.4 mg under the tongue every 5 (five) minutes as needed for chest pain.      . potassium chloride SA (K-DUR,KLOR-CON) 20 MEQ tablet Take 40 mEq by mouth 2 (two) times daily.      [name] spironolactone (ALDACTONE) 12.5 mg TABS tablet Take 0.5 tablets (12.5 mg total) by mouth daily.  30 tablet  6  . tiotropium (SPIRIVA) 18 MCG inhalation capsule Place 18 mcg into inhaler and inhale daily.       No current facility-administered medications for this visit.     Allergies:   Aspirin   Social History:  The patient  reports that he has quit smoking. He does not have any smokeless tobacco history on file.   Family History:  The patient's family history is not on file.   ROS:  Please see the history of present illness.   No bleeding issues.  No fever, cough, wheezing.   All other systems reviewed and negative.   PHYSICAL EXAM: VS:  BP 140/58  Pulse 48  Ht _0  (1.753 m)  Wt 144 lb (65.318 kg)  BMI 21.26 kg/m2 Well nourished, well developed, in no acute distress HEENT: normal Neck: no JVD Cardiac:  normal S1, S2; RRR; no murmur Lungs:  clear to auscultation bilaterally, no wheezing, rhonchi or rales Abd: soft, nontender, no hepatomegaly Ext: no edema Skin: warm and dry Neuro:  CNs 2-12 intact, no focal abnormalities noted  EKG:  Sinus brady, HR 48, LBBB     ASSESSMENT AND PLAN:  1. Chronic systolic heart failure:  On exam, he appears stable.  However, he has had some symptoms that are concerning for PND and his weight is up.  I will increase Lasix to 80 in A and 40 in P with extra K+ 20 mEq daily for 2 days.  I have encouraged him to continue to watch his weights daily and we  discussed when he should take extra Lasix.  He knows to call if symptoms at night do not resolve or if he becomes more short of breath.  Check BMET and BNP in 1  week.   2. Coronary artery disease:  Anatomy is stable by recent LHC.  He has been having some chest discomfort.  It is not clearly anginal.  However, I will try him on Imdur 30 mg QD.  This will also help control his BP and further advance his chronic CHF treatment.  He does not take PDE-5 inhibitors.  Continue ASA, Plavix, statin, beta blocker.  3. Cardiomyopathy:  Probable mixed ischemic and non-ischemic.  Continue beta blocker, ARB, spironolactone.  Add nitrates as noted.  4. Bradycardia:  He is largely asymptomatic.  Dr. Doug Sou prior notes indicate that beta blocker dose has been reduced in the past due to slow HR.  His HR today is quite slow.  I will gently decrease his Coreg to 12.5 mg in A and 6.25 mg in the PM.   5. NSVT (nonsustained ventricular tachycardia):  Continue beta blocker.  Continue LifeVest.  No driving for now.  I discussed this with him today. 6. HLD (hyperlipidemia):  Continue statin.  7. HTN (hypertension), malignant:  Borderline control.  Add nitrates as noted.   Disposition:  FU with Dr. Thompson Grayer in October as planned.  Arrange FU with Dr. Peter Martinique in 2 mos.    Signed, Versie Starks, MHS 05/25/2014 12:26 PM    [address], [place], [place]  [address] Phone: [phone]; Fax: [phone]

## 2014-05-25 NOTE — Patient Instructions (Addendum)
INCREASE LASIX TO 80 MG IN THE AM AND 40 IN THE PM FOR 2 DAYS ONLY.... THEN GO BACK TO YOUR REGULAR DOSE OF LASIX  40 MG TWICE DAILY  TAKE EXTRA POTASSIUM 20 MEQ FOR THE 2 DAYS OF THE INCREASED LASIX; AFTER THE 2 DAYS GO BACK TO YOUR REGULAR DOSE OF POTASSIUM  START IMDUR 30 MG 1 TABLET DAILY; NEW RX SENT IN TODAY  DECREASE COREG TO 12.5 MG IN THE AM AND 6.25 MG IN THE PM  LAB WORK TO BE DONE IN 1 WEEK, BMET, BNP  KEEP YOUR FOLLOW UP WITH DR. ALLRED 06/2014  YOU WILL NEED TO FOLLOW UP WITH DR. Swaziland IN 2 MONTHS

## 2014-05-28 ENCOUNTER — Encounter: Payer: Self-pay | Admitting: Cardiology

## 2014-05-30 ENCOUNTER — Encounter (HOSPITAL_COMMUNITY): Payer: Self-pay | Admitting: Emergency Medicine

## 2014-05-30 ENCOUNTER — Inpatient Hospital Stay (HOSPITAL_COMMUNITY)
Admission: EM | Admit: 2014-05-30 | Discharge: 2014-06-01 | DRG: 309 | Disposition: A | Discharge status: Home / Self Care | Payer: Medicare Other | Attending: Internal Medicine | Admitting: Internal Medicine

## 2014-05-30 ENCOUNTER — Emergency Department (HOSPITAL_COMMUNITY): Payer: Medicare Other

## 2014-05-30 DIAGNOSIS — Z87891 Personal history of nicotine dependence: Secondary | ICD-10-CM

## 2014-05-30 DIAGNOSIS — Z7902 Long term (current) use of antithrombotics/antiplatelets: Secondary | ICD-10-CM

## 2014-05-30 DIAGNOSIS — I472 Ventricular tachycardia, unspecified: Principal | ICD-10-CM | POA: Diagnosis present

## 2014-05-30 DIAGNOSIS — I4729 Other ventricular tachycardia: Principal | ICD-10-CM

## 2014-05-30 DIAGNOSIS — I5022 Chronic systolic (congestive) heart failure: Secondary | ICD-10-CM

## 2014-05-30 DIAGNOSIS — I495 Sick sinus syndrome: Secondary | ICD-10-CM

## 2014-05-30 DIAGNOSIS — I519 Heart disease, unspecified: Secondary | ICD-10-CM

## 2014-05-30 DIAGNOSIS — E785 Hyperlipidemia, unspecified: Secondary | ICD-10-CM | POA: Diagnosis present

## 2014-05-30 DIAGNOSIS — I5042 Chronic combined systolic (congestive) and diastolic (congestive) heart failure: Secondary | ICD-10-CM | POA: Diagnosis present

## 2014-05-30 DIAGNOSIS — Z9861 Coronary angioplasty status: Secondary | ICD-10-CM

## 2014-05-30 DIAGNOSIS — N179 Acute kidney failure, unspecified: Secondary | ICD-10-CM

## 2014-05-30 DIAGNOSIS — Z91199 Patient's noncompliance with other medical treatment and regimen due to unspecified reason: Secondary | ICD-10-CM

## 2014-05-30 DIAGNOSIS — Z7982 Long term (current) use of aspirin: Secondary | ICD-10-CM

## 2014-05-30 DIAGNOSIS — Z79899 Other long term (current) drug therapy: Secondary | ICD-10-CM

## 2014-05-30 DIAGNOSIS — I428 Other cardiomyopathies: Secondary | ICD-10-CM | POA: Diagnosis present

## 2014-05-30 DIAGNOSIS — R001 Bradycardia, unspecified: Secondary | ICD-10-CM

## 2014-05-30 DIAGNOSIS — I447 Left bundle-branch block, unspecified: Secondary | ICD-10-CM

## 2014-05-30 DIAGNOSIS — Z720 Tobacco use: Secondary | ICD-10-CM | POA: Diagnosis present

## 2014-05-30 DIAGNOSIS — Z9119 Patient's noncompliance with other medical treatment and regimen: Secondary | ICD-10-CM

## 2014-05-30 DIAGNOSIS — I25119 Atherosclerotic heart disease of native coronary artery with unspecified angina pectoris: Secondary | ICD-10-CM

## 2014-05-30 DIAGNOSIS — I10 Essential (primary) hypertension: Secondary | ICD-10-CM

## 2014-05-30 DIAGNOSIS — M545 Low back pain, unspecified: Secondary | ICD-10-CM | POA: Diagnosis present

## 2014-05-30 DIAGNOSIS — R55 Syncope and collapse: Secondary | ICD-10-CM

## 2014-05-30 DIAGNOSIS — J449 Chronic obstructive pulmonary disease, unspecified: Secondary | ICD-10-CM | POA: Diagnosis present

## 2014-05-30 DIAGNOSIS — I509 Heart failure, unspecified: Secondary | ICD-10-CM | POA: Diagnosis present

## 2014-05-30 DIAGNOSIS — J4489 Other specified chronic obstructive pulmonary disease: Secondary | ICD-10-CM | POA: Diagnosis present

## 2014-05-30 DIAGNOSIS — G8929 Other chronic pain: Secondary | ICD-10-CM | POA: Diagnosis present

## 2014-05-30 DIAGNOSIS — E860 Dehydration: Secondary | ICD-10-CM | POA: Diagnosis present

## 2014-05-30 DIAGNOSIS — F17200 Nicotine dependence, unspecified, uncomplicated: Secondary | ICD-10-CM | POA: Diagnosis present

## 2014-05-30 DIAGNOSIS — I429 Cardiomyopathy, unspecified: Secondary | ICD-10-CM | POA: Diagnosis present

## 2014-05-30 DIAGNOSIS — I2510 Atherosclerotic heart disease of native coronary artery without angina pectoris: Secondary | ICD-10-CM

## 2014-05-30 DIAGNOSIS — T50905A Adverse effect of unspecified drugs, medicaments and biological substances, initial encounter: Secondary | ICD-10-CM

## 2014-05-30 HISTORY — DX: Ischemic cardiomyopathy: I25.5

## 2014-05-30 HISTORY — DX: Syncope and collapse: R55

## 2014-05-30 HISTORY — DX: Chronic combined systolic (congestive) and diastolic (congestive) heart failure: I50.42

## 2014-05-30 LAB — CBC
HEMATOCRIT: 33.5 % — AB (ref 39.0–52.0)
Hemoglobin: 10.8 g/dL — ABNORMAL LOW (ref 13.0–17.0)
MCH: 28.6 pg (ref 26.0–34.0)
MCHC: 32.2 g/dL (ref 30.0–36.0)
MCV: 88.9 fL (ref 78.0–100.0)
Platelets: 136 10*3/uL — ABNORMAL LOW (ref 150–400)
RBC: 3.77 MIL/uL — ABNORMAL LOW (ref 4.22–5.81)
RDW: 13.7 % (ref 11.5–15.5)
WBC: 4.6 10*3/uL (ref 4.0–10.5)

## 2014-05-30 LAB — COMPREHENSIVE METABOLIC PANEL
ALBUMIN: 3.2 g/dL — AB (ref 3.5–5.2)
ALT: 12 U/L (ref 0–53)
AST: 17 U/L (ref 0–37)
Alkaline Phosphatase: 57 U/L (ref 39–117)
Anion gap: 11 (ref 5–15)
BUN: 20 mg/dL (ref 6–23)
CALCIUM: 8.7 mg/dL (ref 8.4–10.5)
CO2: 27 mEq/L (ref 19–32)
CREATININE: 1.89 mg/dL — AB (ref 0.50–1.35)
Chloride: 109 mEq/L (ref 96–112)
GFR calc non Af Amer: 34 mL/min — ABNORMAL LOW (ref >90)
GFR, EST AFRICAN AMERICAN: 40 mL/min — AB (ref >90)
Glucose, Bld: 101 mg/dL — ABNORMAL HIGH (ref 70–99)
Potassium: 3.7 mEq/L (ref 3.7–5.3)
Sodium: 147 mEq/L (ref 137–147)
Total Bilirubin: 0.3 mg/dL (ref 0.3–1.2)
Total Protein: 6.4 g/dL (ref 6.0–8.3)

## 2014-05-30 LAB — URINALYSIS, ROUTINE W REFLEX MICROSCOPIC
Bilirubin Urine: NEGATIVE
Glucose, UA: NEGATIVE mg/dL
Hgb urine dipstick: NEGATIVE
Ketones, ur: NEGATIVE mg/dL
Leukocytes, UA: NEGATIVE
NITRITE: NEGATIVE
PROTEIN: NEGATIVE mg/dL
SPECIFIC GRAVITY, URINE: 1.01 (ref 1.005–1.030)
Urobilinogen, UA: 0.2 mg/dL (ref 0.0–1.0)
pH: 5 (ref 5.0–8.0)

## 2014-05-30 LAB — TROPONIN I

## 2014-05-30 MED ORDER — ASPIRIN EC 81 MG PO TBEC
81.0000 mg | DELAYED_RELEASE_TABLET | Freq: Every day | ORAL | Status: DC
  Administered 2014-05-31 – 2014-06-01 (×2): 81 mg via ORAL
  Filled 2014-05-30 (×2): qty 1

## 2014-05-30 MED ORDER — ISOSORBIDE MONONITRATE ER 30 MG PO TB24
30.0000 mg | ORAL_TABLET | Freq: Every day | ORAL | Status: DC
  Filled 2014-05-30: qty 1

## 2014-05-30 MED ORDER — MOMETASONE FURO-FORMOTEROL FUM 100-5 MCG/ACT IN AERO
2.0000 | INHALATION_SPRAY | Freq: Two times a day (BID) | RESPIRATORY_TRACT | Status: DC
  Administered 2014-05-30 – 2014-06-01 (×4): 2 via RESPIRATORY_TRACT
  Filled 2014-05-30: qty 8.8

## 2014-05-30 MED ORDER — CLOPIDOGREL BISULFATE 75 MG PO TABS
75.0000 mg | ORAL_TABLET | Freq: Every day | ORAL | Status: DC
  Administered 2014-05-31 – 2014-06-01 (×2): 75 mg via ORAL
  Filled 2014-05-30 (×2): qty 1

## 2014-05-30 MED ORDER — ALBUTEROL SULFATE (2.5 MG/3ML) 0.083% IN NEBU: 2.5000 mg | INHALATION_SOLUTION | Freq: Four times a day (QID) | RESPIRATORY_TRACT | Status: DC | PRN

## 2014-05-30 MED ORDER — SODIUM CHLORIDE 0.9 % IV BOLUS (SEPSIS)
500.0000 mL | Freq: Once | INTRAVENOUS | Status: AC
  Administered 2014-05-30: 500 mL via INTRAVENOUS

## 2014-05-30 MED ORDER — HEPARIN SODIUM (PORCINE) 5000 UNIT/ML IJ SOLN
5000.0000 [IU] | Freq: Three times a day (TID) | INTRAMUSCULAR | Status: DC
  Administered 2014-05-30 – 2014-06-01 (×5): 5000 [IU] via SUBCUTANEOUS
  Filled 2014-05-30 (×6): qty 1

## 2014-05-30 MED ORDER — NITROGLYCERIN 0.4 MG SL SUBL: 0.4000 mg | SUBLINGUAL_TABLET | SUBLINGUAL | Status: DC | PRN

## 2014-05-30 MED ORDER — SODIUM CHLORIDE 0.9 % IV SOLN
INTRAVENOUS | Status: DC
  Administered 2014-05-30: 50 mL via INTRAVENOUS

## 2014-05-30 MED ORDER — ACETAMINOPHEN 325 MG PO TABS
650.0000 mg | ORAL_TABLET | ORAL | Status: DC | PRN
  Administered 2014-06-01: 650 mg via ORAL
  Filled 2014-05-30: qty 2

## 2014-05-30 MED ORDER — ONDANSETRON HCL 4 MG/2ML IJ SOLN: 4.0000 mg | Freq: Four times a day (QID) | INTRAMUSCULAR | Status: DC | PRN

## 2014-05-30 MED ORDER — ATORVASTATIN CALCIUM 10 MG PO TABS
10.0000 mg | ORAL_TABLET | Freq: Every day | ORAL | Status: DC
  Administered 2014-05-30 – 2014-06-01 (×3): 10 mg via ORAL
  Filled 2014-05-30 (×3): qty 1

## 2014-05-30 MED ORDER — TIOTROPIUM BROMIDE MONOHYDRATE 18 MCG IN CAPS
18.0000 ug | ORAL_CAPSULE | Freq: Every day | RESPIRATORY_TRACT | Status: DC
  Administered 2014-05-31 – 2014-06-01 (×2): 18 ug via RESPIRATORY_TRACT
  Filled 2014-05-30: qty 5

## 2014-05-30 MED ORDER — ALBUTEROL SULFATE HFA 108 (90 BASE) MCG/ACT IN AERS: 1.0000 | INHALATION_SPRAY | Freq: Four times a day (QID) | RESPIRATORY_TRACT | Status: DC | PRN

## 2014-05-30 NOTE — ED Notes (Addendum)
Pt presents to department via [facility] from home for evaluation of syncopal episode x2 this morning. Had cardiac catherization last week post MI, was suggested to have pacemaker placed, pt declined. pt states he was constipated last night and took laxatives at home, multiple episodes of diarrhea this morning. Pt is alert and oriented x4.

## 2014-05-30 NOTE — Consult Note (Signed)
CARDIOLOGY CONSULT NOTE   Patient ID: [name] MRN: [id number], DOB/AGE: [birth year]   Admit date: 05/30/2014 Date of Consult: 05/30/2014   Primary Physician: Rene Paci, MD Primary Cardiologist: P. Swaziland, MD / Shela Commons. Allred, MD   Pt. Profile  73 y/o male with a h/o CAD s/p recent admission 2/2 VDRF who presented to the ED today following syncope x 2.  Problem List  Past Medical History  Diagnosis Date  . Dyslipidemia     a. on statin  . LBBB (left bundle branch block)   . CAD (coronary artery disease)     a. s/p stent RCA DES 9/09; b. 2014 Attempted PCI of OM1 @ [facility];  c. 04/2014 Cath: LAD 40-50p, D1 95-99 (chronic), LCX 30-40 inf branch, OM1 CTO, RCA 30-40p, RCA patent stent, EF 35%->Med Rx.  . HTN (hypertension)     a. Reports intolerance to hydralazine; b. no beta blockers 2/2 bradycardia;  c. failed on ACE and ARB.  [name] Noncompliance   . Chronic combined systolic and diastolic CHF (congestive heart failure)     a. EF about 40 to 45% per echo in April 2013;  b. 04/2014 Echo: EF 20-25%, sev LVH, sev glob HK, Gr 1 DD, mildly reduced RV fxn, PASP .  [name] LVH (left ventricular hypertrophy)   . COPD (chronic obstructive pulmonary disease) dx 06/2013    PFTs 07/08/13: mod obst with resp to bronchodilator, moderate decrease diffusion, airtrapping  . Chronic low back pain   . Mixed Ischemic/Non-ischemic Cardiomyopathy     a. 04/2014 Echo: EF 20-25%, sev glob HK.  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 NSVT (nonsustained ventricular tachycardia)     a. LifeVest applied 04/2014 admission->refused ICD placement.  [name] HLD (hyperlipidemia)     Past Surgical History  Procedure Laterality Date  . Cardiac catheterization      ejection fraction 50%     Allergies  Allergies  Allergen Reactions  . Aspirin Other (See Comments)    GI upset  . Influenza Vaccines Other (See Comments)    Pt declines  . Pneumococcal Vaccines Other (See Comments)    Pt declines     HPI   73 y/o male with the above complex problem list.  He has a h/o CAD s/p prior RCA stenting.  He was admitted to [facility] earlier this month in the setting of acute resp failure and VDRF requiring prolonged intubation.  He had documented NSVT but ruled out for MI.  He underwent cath revealing stable anatomy with patent RCA stent.  Echo showed EF of 20-25% with sev global HK - this was down from prior echo in 12/2011 (40-45%).  He was placed on coreg, arb, lasix, and spiro.  Coreg was titrated to 12.5mg  BID and though he had some bradycardia, he did not have anything documented below 50.  He continued to have runs of NSVT and was seen by EP with recommendation for  ICD implantation, however he deferred and instead, a lifevest was placed. He was seen in clinic on 9/15 and reported feeling relatively well but with reprot of 4-5 lb wt gain.  His lasix was increased to 80 am, 40 pm.  He was brady with rates in the 40's and his coreg dose was decreased to 12.5 am, 6.25 pm.  Of note, he was previously noted to be brady on 3.125 bid and coreg was d/c'd.  Since office visit, he has only been taking coreg daily - skipping the planned evening dose.  He says that 2 days ago, he started feeling constipated and last night took some laxatives.  He had 4 bowel mvmts throughout the night.  This AM, he got up after 10 to use the bathroom and after stepping into the bathroom, he felt lightheaded, then lost consciousness, and fell to the floor.  He regained consciousness once he was on the floor and stood and placed his hands on the sink.  He was still lightheaded and again lost consciousness and fell to the floor.  Again, he quickly regained consciousness.  At that point his wife called EMS.  He was found to be bradycardic with rates in the 30's and was transported to [facility].  Here, blood pressures have been stable however HR's continue in the high 30's.  He is asymptomatic at rest.  BUN/Creat has just returned elevated @ 20/1.89  (was nl on last admission).  Labs otw unremarkable.  He is currently w/o complaints.  Home Medications  Prior to Admission medications   Medication Sig Start Date End Date Taking? Authorizing Provider  albuterol (PROVENTIL HFA;VENTOLIN HFA) 108 (90 BASE) MCG/ACT inhaler Inhale 1 puff into the lungs every 6 (six) hours as needed for wheezing or shortness of breath. 12/24/13  Yes Peter M Swaziland, MD  aspirin EC 81 MG tablet Take 1 tablet (81 mg total) by mouth daily. 08/24/13  Yes Newt Lukes, MD  atorvastatin (LIPITOR) 10 MG tablet Take 1 tablet (10 mg total) by mouth daily. 06/18/13 06/18/14 Yes Peter M Swaziland, MD  carvedilol (COREG) 12.5 MG tablet Take 12.5 mg by mouth daily.    Yes Historical Provider, MD  clopidogrel (PLAVIX) 75 MG tablet Take 1 tablet (75 mg total) by mouth daily. 06/18/13  Yes Peter M Swaziland, MD  Fluticasone-Salmeterol (ADVAIR) 250-50 MCG/DOSE AEPB Inhale 1 puff into the lungs 2 (two) times daily.   Yes Historical Provider, MD  furosemide (LASIX) 40 MG tablet Take 1 tablet (40 mg total) by mouth 2 (two) times daily. 05/18/14  Yes Azalee Course, PA  isosorbide mononitrate (IMDUR) 30 MG 24 hr tablet Take 30 mg by mouth daily.   Yes Historical Provider, MD  losartan (COZAAR) 100 MG tablet Take 50 mg by mouth 2 (two) times daily.    Yes Historical Provider, MD  nitroGLYCERIN (NITROSTAT) 0.4 MG SL tablet Place 1 tablet (0.4 mg total) under the tongue every 5 (five) minutes as needed for chest pain. 09/05/13 12/18/14 Yes Elease Etienne, MD  potassium chloride SA (K-DUR,KLOR-CON) 20 MEQ tablet Take 20 mEq by mouth 2 (two) times daily.   Yes Historical Provider, MD  spironolactone (ALDACTONE) 12.5 mg TABS tablet Take 0.5 tablets (12.5 mg total) by mouth daily. 05/18/14  Yes Azalee Course, PA  tiotropium (SPIRIVA HANDIHALER) 18 MCG inhalation capsule Place 1 capsule (18 mcg total) into inhaler and inhale daily. 07/31/13  Yes Newt Lukes, MD    Family History Family History  Problem  Relation Age of Onset  . Heart attack Neg Hx   . Stroke Neg Hx     Social History History   Social History  . Marital Status: Married    Spouse Name: N/A    Number of Children: 3  . Years of Education: N/A   Occupational History  . security guard.    Social History Main Topics  . Smoking status: Former Smoker -- 0.50 packs/day for 56 years    Types: Cigarettes    Quit date: 09/10/2012  . Smokeless tobacco: Not on file  .  Alcohol Use: No  . Drug Use: No  . Sexual Activity: Not Currently   Other Topics Concern  . Not on file   Social History Narrative           Review of Systems  General:  No chills, fever, night sweats or weight changes.  Cardiovascular:  Syncope as outlined above.  No chest pain, dyspnea on exertion, edema, orthopnea, palpitations, paroxysmal nocturnal dyspnea. Dermatological: No rash, lesions/masses Respiratory: No cough, dyspnea Urologic: No hematuria, dysuria Abdominal:   +++constipation. No nausea, vomiting, diarrhea, bright red blood per rectum, melena, or hematemesis Neurologic:  No visual changes, wkns, changes in mental status. All other systems reviewed and are otherwise negative except as noted above.  Physical Exam  Blood pressure 118/51, pulse 38, temperature 98 F (36.7 C), temperature source Oral, resp. rate 14, SpO2 99.00%.  General: Pleasant, NAD Psych: Normal affect. Neuro: Alert and oriented X 3. Moves all extremities spontaneously. HEENT: Normal  Neck: Supple without bruits or JVD. Lungs:  Resp regular and unlabored, CTA. Heart: RRR no s3, s4, or murmurs. Abdomen: Soft, non-tender, non-distended, BS + x 4.  Extremities: No clubbing, cyanosis or edema. DP/PT/Radials 2+ and equal bilaterally. Skin: dry/cracking on feet.  Mld tenting of hand skin  Labs  Lab Results  Component Value Date   TROPONINI <0.30 05/30/2014    Lab Results  Component Value Date   WBC 4.6 05/30/2014   HGB 10.8* 05/30/2014   HCT 33.5* 05/30/2014    MCV 88.9 05/30/2014   PLT 136* 05/30/2014    Recent Labs Lab 05/24/14 1357  NA 142  K 3.7  CL 104  CO2 28  BUN 12  CREATININE 0.9  CALCIUM 9.4  GLUCOSE 69*   Radiology/Studies  CXR pending  ECG  Sinus brady, 38, 1st deg avb, lbbb.  ASSESSMENT AND PLAN  1.  Syncope:  Pt with two syncopal episodes this AM after diarrhea overnight.  Syncope is likely multifactorial in the setting of dehydration (BUN/Creat up to 20/1.89 with recent adjustment of home lasix dose on top of spiro and ARB and with recent loose BM) along with bradycardia with rates in the high 30's currently - though he is asymptomatic at rest..  He was previously on 3.125 mg of coreg BID and this was discontinued 2/2 bradycardia.  During last hospitalization, coreg was resumed and titrated to 12.5mg  bid.  On recent clinic visit, it was reduced to 12.5 am, 6.25 pm, though pt has only been taking 12.5 daily.  Will plan to admit, hold coreg, lasix, spiro, and acei.  Gently hydrate.  Follow tele.  When HR stabilizes, could consider resumption of low-dose metoprolol succinate vs discontinuation of bb altogether.  Will need to carefully add back diuretics.  2.  Chronic systolic CHF/Mixed ICM/NIMC:  Dry on exam with elevated BUN/Creat.  As above, hold diuretics, bb, and arb.  Follow.  3.  CAD:  No chest pain.  Recent cath revealing stable anatomy with patent RCA stent.  Cont ASA, plavix, and statin.  4.  Sinus Bradycardia:  Hold coreg.  Prior h/o brady on coreg 3.125.  Will have to reconsider our bb options prior to resuming.  5.  Acute renal failure:  In setting of diuretic/arb therapy.  Hold diuretics and ARB.  Gently hydrate today.  Follow.  6.  NSVT:  LifeVest in place.  Follow on tele with BB on hold.  7.  HTN:  Stable.  Follow off of meds.  8.  HL:  Cont  statin.  9.  COPD:  Cont inhalers.  Signed, Nicolasa Ducking, NP 05/30/2014, 12:57 PM  Patient seen and examined  Agree with findings of C Brion Aliment above  I have  amended to reflect my findings.  HOld b blocker for now  [employer].    Dietrich Pates

## 2014-05-30 NOTE — ED Notes (Signed)
Pt transported to xray 

## 2014-05-30 NOTE — ED Provider Notes (Addendum)
CSN: [id number]     Arrival date & time 05/30/14  1125 History   First MD Initiated Contact with Patient 05/30/14 1130     Chief Complaint  Patient presents with  . Loss of Consciousness  . Diarrhea     (Consider location/radiation/quality/duration/timing/severity/associated sxs/prior Treatment) Patient is a 73 y.o. male presenting with syncope and diarrhea. The history is provided by the patient.  Loss of Consciousness Associated symptoms: no chest pain, no confusion, no fever, no headaches, no palpitations, no shortness of breath, no vomiting and no weakness   Diarrhea Associated symptoms: no abdominal pain, no chills, no fever, no headaches and no vomiting   pt with hx cad, ischemic cardiomyopathy, copd, recent admit for resp failure, during which was noted to have several episode nsvt.  Was d/c'd to home on lifevest, declining pacemaker/defibrilattor.  Pt presents after 2 syncopal events this morning. Pt states had felt constipated x 2 days, had taken laxative, throughout night had 4-5 stools. This morning got up to go to bathroom, felt faint, lightheaded/dizzy, then warm/flushed/sweaty, then fainted. Denies injury w fall other than bumping knees. Pt subsequently got up, and fainted again.  Denies shock. Denies palpitations. Pt denies recent change in meds, although states he took all his normal meds this morning on an empty stomach. No recent blood loss, rectal bleeding or melena. No fever or chills.       Past Medical History  Diagnosis Date  . Dyslipidemia     on statin  . LBBB (left bundle branch block)   . CAD (coronary artery disease)     s/p stent RCA DES 9/09; s/p Myoview 2013 with reduced EF to 40%; no ischemia  . HTN (hypertension)     Reports intolerance to hydralazine; no beta blockers 2/2 bradycardia; failed on ACE and ARB  . Noncompliance   . LV dysfunction     EF about 40 to 45% per echo in April 2013  . LVH (left ventricular hypertrophy)   . COPD (chronic  obstructive pulmonary disease) dx 06/2013    PFTs 07/08/13: mod obst with resp to bronchodilator, moderate decrease diffusion, airtrapping  . Chronic low back pain   . Chronic systolic heart failure     Echo (8/15):  Severe LVH, EF 20-25%, severe global HK, Gr 1 DD, mild reduced RVF, PASP 38 mmHg, small eff  . 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 LBBB (left bundle branch block) 05/08/2014  . CAD (coronary artery disease)     a. prior PCI to RCA;  b. Nuclear (3/13):  Normal perfusion, EF 40%;  c. LHC (8/15):  prox LAD 40-50%, D1 95-99%, Int Br of CFX 30-40%, OM1 with CTO (L>L collats), prox RCA 30-40%, mid RCA stent ok, EF 35%   . NSVT (nonsustained ventricular tachycardia)     LifeVest applied 04/2014 admission  . HLD (hyperlipidemia)    Past Surgical History  Procedure Laterality Date  . Cardiac catheterization      ejection fraction 50%   Family History  Problem Relation Age of Onset  . Heart attack Neg Hx   . Stroke Neg Hx    History  Substance Use Topics  . Smoking status: Former Smoker -- 0.50 packs/day for 56 years    Types: Cigarettes    Quit date: 09/10/2012  . Smokeless tobacco: Not on file  . Alcohol Use: No    Review of Systems  Constitutional: Negative for fever and  chills.  HENT: Negative for sore throat.   Eyes: Negative for redness.  Respiratory: Negative for cough and shortness of breath.   Cardiovascular: Positive for syncope. Negative for chest pain, palpitations and leg swelling.  Gastrointestinal: Positive for diarrhea. Negative for vomiting and abdominal pain.  Endocrine: Negative for polyuria.  Genitourinary: Negative for dysuria and flank pain.  Musculoskeletal: Negative for back pain and neck pain.  Skin: Negative for rash and wound.  Neurological: Negative for weakness, numbness and headaches.  Hematological: Does not bruise/bleed easily.  Psychiatric/Behavioral: Negative for  confusion.      Allergies  Aspirin; Influenza vaccines; Pneumococcal vaccines; and Aspirin  Home Medications   Prior to Admission medications   Medication Sig Start Date End Date Taking? Authorizing Provider  acetaminophen (TYLENOL) 500 MG tablet Take 1 tablet (500 mg total) by mouth every 6 (six) hours as needed for mild pain, moderate pain or fever. 07/31/13   Newt Lukes, MD  acetaminophen (TYLENOL) 500 MG tablet Take 500 mg by mouth every 6 (six) hours as needed for mild pain, fever or headache.    Historical Provider, MD  ADVAIR DISKUS 250-50 MCG/DOSE AEPB INHALE 1 PUFF INTO THE LUNGS 2 (TWO) TIMES DAILY. 03/25/14   Newt Lukes, MD  albuterol (PROVENTIL HFA;VENTOLIN HFA) 108 (90 BASE) MCG/ACT inhaler Inhale 1 puff into the lungs every 6 (six) hours as needed for wheezing or shortness of breath. 12/24/13   Peter M [place], MD  albuterol (PROVENTIL HFA;VENTOLIN HFA) 108 (90 BASE) MCG/ACT inhaler Inhale 1-2 puffs into the lungs every 4 (four) hours as needed for wheezing or shortness of breath.    Historical Provider, MD  aspirin EC 81 MG tablet Take 1 tablet (81 mg total) by mouth daily. 08/24/13   Newt Lukes, MD  aspirin EC 81 MG tablet Take 81 mg by mouth daily.    Historical Provider, MD  atorvastatin (LIPITOR) 10 MG tablet Take 1 tablet (10 mg total) by mouth daily. 06/18/13 06/18/14  Peter M [place], MD  atorvastatin (LIPITOR) 10 MG tablet Take 10 mg by mouth daily.    Historical Provider, MD  carvedilol (COREG) 12.5 MG tablet TAKE 12.5 MG IN THE MORNING AND 6.25 MG IN THE PM =  (1/2 TAB IN THE PM) 05/25/14   Beatrice Lecher, PA-C  clopidogrel (PLAVIX) 75 MG tablet Take 1 tablet (75 mg total) by mouth daily. 06/18/13   Peter M [place], MD  clopidogrel (PLAVIX) 75 MG tablet Take 75 mg by mouth daily.    Historical Provider, MD  Fluticasone-Salmeterol (ADVAIR) 250-50 MCG/DOSE AEPB Inhale 1 puff into the lungs 2 (two) times daily.    Historical Provider, MD  furosemide  (LASIX) 40 MG tablet Take 1 tablet (40 mg total) by mouth 2 (two) times daily. 05/18/14   Azalee Course, PA  hydrochlorothiazide (HYDRODIURIL) 25 MG tablet Take 1 tablet (25 mg total) by mouth daily. 06/18/13 06/18/14  Peter M [place], MD  HYDROcodone-acetaminophen (NORCO/VICODIN) 5-325 MG per tablet Take 1 tablet by mouth at bedtime as needed for severe pain. 10/05/13   Newt Lukes, MD  HYDROcodone-acetaminophen (NORCO/VICODIN) 5-325 MG per tablet Take 1 tablet by mouth at bedtime as needed for severe pain.    Historical Provider, MD  isosorbide mononitrate (IMDUR) 30 MG 24 hr tablet Take 1 tablet (30 mg total) by mouth daily. 05/25/14   Scott T Weaver, PA-C  KLOR-CON M20 20 MEQ tablet TAKE 2 TABLETS TWICE DAILY. 12/24/13   Peter M [place], MD  losartan (COZAAR) 100 MG tablet Take 1 tablet (100 mg total) by mouth daily. 12/24/13   Peter M [place], MD  losartan (COZAAR) 100 MG tablet Take 100 mg by mouth daily.    Historical Provider, MD  nitroGLYCERIN (NITROSTAT) 0.4 MG SL tablet Place 1 tablet (0.4 mg total) under the tongue every 5 (five) minutes as needed for chest pain. 09/05/13 12/18/14  Elease Etienne, MD  nitroGLYCERIN (NITROSTAT) 0.4 MG SL tablet Place 0.4 mg under the tongue every 5 (five) minutes as needed for chest pain.    Historical Provider, MD  potassium chloride SA (K-DUR,KLOR-CON) 20 MEQ tablet Take 40 mEq by mouth 2 (two) times daily.    Historical Provider, MD  spironolactone (ALDACTONE) 12.5 mg TABS tablet Take 0.5 tablets (12.5 mg total) by mouth daily. 05/18/14   Azalee Course, PA  tiotropium (SPIRIVA HANDIHALER) 18 MCG inhalation capsule Place 1 capsule (18 mcg total) into inhaler and inhale daily. 07/31/13   Newt Lukes, MD  tiotropium (SPIRIVA) 18 MCG inhalation capsule Place 18 mcg into inhaler and inhale daily.    Historical Provider, MD   BP 118/51  Pulse 38  Temp(Src) 98 F (36.7 C) (Oral)  Resp 14  SpO2 99% Physical Exam  Nursing note and vitals reviewed. Constitutional:  He is oriented to person, place, and time. He appears well-developed and well-nourished.  Markedly bradycardic.   HENT:  Head: Atraumatic.  Eyes: Conjunctivae are normal. Pupils are equal, round, and reactive to light. No scleral icterus.  Neck: Normal range of motion. Neck supple. No tracheal deviation present. No thyromegaly present.  Cardiovascular: Regular rhythm, normal heart sounds and intact distal pulses.   bradycardic  Pulmonary/Chest: Effort normal. No accessory muscle usage. No respiratory distress. He exhibits no tenderness.  Abdominal: Soft. Bowel sounds are normal. He exhibits no distension and no mass. There is no tenderness. There is no rebound and no guarding.  No puls mass  Genitourinary:  No cva tenderness  Musculoskeletal: Normal range of motion. He exhibits no edema and no tenderness.  CTLS spine, non tender, aligned, no step off.   Neurological: He is alert and oriented to person, place, and time.  Skin: Skin is warm and dry. No rash noted.  Psychiatric: He has a normal mood and affect.    ED Course  Procedures (including critical care time) Labs Review  Results for orders placed during the hospital encounter of 05/30/14  CBC      Result Value Ref Range   WBC 4.6  4.0 - 10.5 K/uL   RBC 3.77 (*) 4.22 - 5.81 MIL/uL   Hemoglobin 10.8 (*) 13.0 - 17.0 g/dL   HCT 73.5 (*) 32.9 - 92.4 %   MCV 88.9  78.0 - 100.0 fL   MCH 28.6  26.0 - 34.0 pg   MCHC 32.2  30.0 - 36.0 g/dL   RDW 26.8  34.1 - 96.2 %   Platelets 136 (*) 150 - 400 K/uL  COMPREHENSIVE METABOLIC PANEL      Result Value Ref Range   Sodium 147  137 - 147 mEq/L   Potassium 3.7  3.7 - 5.3 mEq/L   Chloride 109  96 - 112 mEq/L   CO2 27  19 - 32 mEq/L   Glucose, Bld 101 (*) 70 - 99 mg/dL   BUN 20  6 - 23 mg/dL   Creatinine, Ser 2.29 (*) 0.50 - 1.35 mg/dL   Calcium 8.7  8.4 - 79.8 mg/dL   Total Protein 6.4  6.0 - 8.3 g/dL   Albumin 3.2 (*) 3.5 - 5.2 g/dL   AST 17  0 - 37 U/L   ALT 12  0 - 53 U/L    Alkaline Phosphatase 57  39 - 117 U/L   Total Bilirubin 0.3  0.3 - 1.2 mg/dL   GFR calc non Af Amer 34 (*) >90 mL/min   GFR calc Af Amer 40 (*) >90 mL/min   Anion gap 11  5 - 15  TROPONIN I      Result Value Ref Range   Troponin I <0.30  <0.30 ng/mL   Dg Chest 2 View  05/30/2014   CLINICAL DATA:  Multiple syncopal episodes today.  EXAM: CHEST  2 VIEW  COMPARISON:  09/03/2013.  FINDINGS: The cardiac silhouette remains borderline enlarged. The lungs remain clear and hyperexpanded with diffuse peribronchial thickening. Minimal thoracic spine degenerative changes. Diffuse osteopenia.  IMPRESSION: 1. No acute abnormality. 2. Stable borderline cardiomegaly. 3. Stable changes of COPD and chronic bronchitis.   Electronically Signed   By: Gordan Payment M.D.   On: 05/30/2014 13:24        EKG Interpretation   Date/Time:  Sunday May 30 2014 11:27:05 EDT Ventricular Rate:  38 PR Interval:  220 QRS Duration: 167 QT Interval:  529 QTC Calculation: 420 R Axis:   21 Text Interpretation:  Sinus bradycardia Left bundle branch block `no acute  change from prior except rate markedly slower Confirmed by Melonee Gerstel  MD,  Kimber Fritts ([id number]) on 05/30/2014 11:39:08 AM      MDM  Iv ns. Continuous pulse ox and monitor. o2 [place].    Ecg.  Cxr. Labs.  Reviewed nursing notes and prior charts for additional history.   Pt w marked bradycardia.  As bp ok, will hold on atropine, pacing or other intervention. Will hold bblocker.  Pt with AKI on labs, ivf/ns boluses.  Recheck remains bradycardic.  Cardiology consulted - will admit.  Will hold b blocker, ?eval for possible sick sinus/pacemaker, given recent nsvt and now marked bradycardia.  CRITICAL CARE  RE severe bradycardia, syncope/collapse, acute kidney injury Performed by: Suzi Roots Total critical care time: 35 Critical care time was exclusive of separately billable procedures and treating other patients. Critical care was necessary to treat or  prevent imminent or life-threatening deterioration. Critical care was time spent personally by me on the following activities: development of treatment plan with patient and/or surrogate as well as nursing, discussions with consultants, evaluation of patient's response to treatment, examination of patient, obtaining history from patient or surrogate, ordering and performing treatments and interventions, ordering and review of laboratory studies, ordering and review of radiographic studies, pulse oximetry and re-evaluation of patient's condition.     Suzi Roots, MD 05/30/14 1402  Chart stuck in inbasket, will sign again.     Suzi Roots, MD 06/01/14 [phone]

## 2014-05-30 NOTE — ED Notes (Signed)
Cardiology at the bedside, pt to be admitted for observation. Denies chest pain, bradycardia on monitor. Pt is alert and oriented x4. NAD.

## 2014-05-30 NOTE — ED Notes (Signed)
Pt states he had x2 syncopal episodes this morning. States he took laxatives last night for constipation and had several episodes of diarrhea this morning. Pt wearing LifeVest upon arrival to ED. Denies chest pain. Respirations unlabored. Lung sounds clear and equal bilaterally. Skin warm and dry. Bradycardia on monitor.

## 2014-05-30 NOTE — ED Notes (Signed)
Attempted to call report

## 2014-05-31 DIAGNOSIS — E785 Hyperlipidemia, unspecified: Secondary | ICD-10-CM | POA: Diagnosis present

## 2014-05-31 DIAGNOSIS — I4729 Other ventricular tachycardia: Secondary | ICD-10-CM | POA: Diagnosis present

## 2014-05-31 DIAGNOSIS — I209 Angina pectoris, unspecified: Secondary | ICD-10-CM

## 2014-05-31 DIAGNOSIS — I447 Left bundle-branch block, unspecified: Secondary | ICD-10-CM

## 2014-05-31 DIAGNOSIS — Z91199 Patient's noncompliance with other medical treatment and regimen due to unspecified reason: Secondary | ICD-10-CM | POA: Diagnosis not present

## 2014-05-31 DIAGNOSIS — N179 Acute kidney failure, unspecified: Secondary | ICD-10-CM | POA: Diagnosis present

## 2014-05-31 DIAGNOSIS — R001 Bradycardia, unspecified: Secondary | ICD-10-CM

## 2014-05-31 DIAGNOSIS — I2510 Atherosclerotic heart disease of native coronary artery without angina pectoris: Secondary | ICD-10-CM | POA: Diagnosis present

## 2014-05-31 DIAGNOSIS — F17200 Nicotine dependence, unspecified, uncomplicated: Secondary | ICD-10-CM | POA: Diagnosis present

## 2014-05-31 DIAGNOSIS — Z79899 Other long term (current) drug therapy: Secondary | ICD-10-CM | POA: Diagnosis not present

## 2014-05-31 DIAGNOSIS — M545 Low back pain, unspecified: Secondary | ICD-10-CM | POA: Diagnosis present

## 2014-05-31 DIAGNOSIS — I5022 Chronic systolic (congestive) heart failure: Secondary | ICD-10-CM

## 2014-05-31 DIAGNOSIS — R55 Syncope and collapse: Secondary | ICD-10-CM | POA: Diagnosis present

## 2014-05-31 DIAGNOSIS — I472 Ventricular tachycardia: Secondary | ICD-10-CM | POA: Diagnosis present

## 2014-05-31 DIAGNOSIS — Z9861 Coronary angioplasty status: Secondary | ICD-10-CM | POA: Diagnosis not present

## 2014-05-31 DIAGNOSIS — I5042 Chronic combined systolic (congestive) and diastolic (congestive) heart failure: Secondary | ICD-10-CM | POA: Diagnosis present

## 2014-05-31 DIAGNOSIS — Z7982 Long term (current) use of aspirin: Secondary | ICD-10-CM | POA: Diagnosis not present

## 2014-05-31 DIAGNOSIS — I10 Essential (primary) hypertension: Secondary | ICD-10-CM | POA: Diagnosis present

## 2014-05-31 DIAGNOSIS — E860 Dehydration: Secondary | ICD-10-CM | POA: Diagnosis present

## 2014-05-31 DIAGNOSIS — I509 Heart failure, unspecified: Secondary | ICD-10-CM | POA: Diagnosis present

## 2014-05-31 DIAGNOSIS — I428 Other cardiomyopathies: Secondary | ICD-10-CM | POA: Diagnosis present

## 2014-05-31 DIAGNOSIS — Z9119 Patient's noncompliance with other medical treatment and regimen: Secondary | ICD-10-CM | POA: Diagnosis not present

## 2014-05-31 DIAGNOSIS — J449 Chronic obstructive pulmonary disease, unspecified: Secondary | ICD-10-CM | POA: Diagnosis present

## 2014-05-31 DIAGNOSIS — Z87891 Personal history of nicotine dependence: Secondary | ICD-10-CM | POA: Diagnosis not present

## 2014-05-31 DIAGNOSIS — Z7902 Long term (current) use of antithrombotics/antiplatelets: Secondary | ICD-10-CM | POA: Diagnosis not present

## 2014-05-31 DIAGNOSIS — G8929 Other chronic pain: Secondary | ICD-10-CM | POA: Diagnosis present

## 2014-05-31 HISTORY — DX: Acute kidney failure, unspecified: N17.9

## 2014-05-31 HISTORY — DX: Bradycardia, unspecified: R00.1

## 2014-05-31 LAB — BASIC METABOLIC PANEL
Anion gap: 15 (ref 5–15)
BUN: 18 mg/dL (ref 6–23)
CO2: 22 mEq/L (ref 19–32)
Calcium: 8.6 mg/dL (ref 8.4–10.5)
Chloride: 105 mEq/L (ref 96–112)
Creatinine, Ser: 1.23 mg/dL (ref 0.50–1.35)
GFR, EST AFRICAN AMERICAN: 67 mL/min — AB (ref >90)
GFR, EST NON AFRICAN AMERICAN: 58 mL/min — AB (ref >90)
Glucose, Bld: 127 mg/dL — ABNORMAL HIGH (ref 70–99)
Potassium: 3.4 mEq/L — ABNORMAL LOW (ref 3.7–5.3)
Sodium: 142 mEq/L (ref 137–147)

## 2014-05-31 MED ORDER — POTASSIUM CHLORIDE CRYS ER 20 MEQ PO TBCR
40.0000 meq | EXTENDED_RELEASE_TABLET | Freq: Once | ORAL | Status: AC
  Administered 2014-06-01: 40 meq via ORAL
  Filled 2014-05-31: qty 2

## 2014-05-31 MED ORDER — LOSARTAN POTASSIUM 50 MG PO TABS
50.0000 mg | ORAL_TABLET | Freq: Every day | ORAL | Status: DC
  Filled 2014-05-31 (×2): qty 1

## 2014-05-31 MED ORDER — MAGNESIUM SULFATE IN D5W 10-5 MG/ML-% IV SOLN
1.0000 g | Freq: Once | INTRAVENOUS | Status: AC
  Administered 2014-06-01: 1 g via INTRAVENOUS
  Filled 2014-05-31: qty 100

## 2014-05-31 MED ORDER — ALUM & MAG HYDROXIDE-SIMETH 200-200-20 MG/5ML PO SUSP
30.0000 mL | ORAL | Status: DC | PRN
  Administered 2014-05-31: 30 mL via ORAL
  Filled 2014-05-31: qty 30

## 2014-05-31 MED ORDER — ISOSORBIDE MONONITRATE ER 60 MG PO TB24
60.0000 mg | ORAL_TABLET | Freq: Every day | ORAL | Status: DC
  Administered 2014-05-31 – 2014-06-01 (×2): 60 mg via ORAL
  Filled 2014-05-31 (×2): qty 1

## 2014-05-31 NOTE — Progress Notes (Signed)
UR completed 

## 2014-05-31 NOTE — Progress Notes (Signed)
Patient had about a 45 beat run of Vtach, heart rate then returned to the 30's-40's sustained.  Patient denied pain.  Patient stated he could feel his heart beating fast.  Patient refusing to wear his Life Vest, stating it doesn't do him any good.  Nurse provided education on the importance and purpose of Life Vest and patient continued to refuse to wear it.  Cardiology paged, Dr. Shirlee Latch returned page and was updated of all information in this note, orders received.

## 2014-05-31 NOTE — Progress Notes (Signed)
Pt has had small bursts of NSVT 5-9 bts.  Pt asymptomatic with life vest in place.  Will continue to monitor.

## 2014-05-31 NOTE — Progress Notes (Signed)
TELEMETRY: Reviewed telemetry pt in sinus brady with rate 45 bpm. Occ. PVCs and bigeminy: Filed Vitals:   05/30/14 1700 05/30/14 2059 05/30/14 2129 05/31/14 0528  BP:   155/68 168/72  Pulse:   72 50  Temp:   98.2 F (36.8 C) 98.4 F (36.9 C)  TempSrc:   Oral   Resp:   16 16  Height: 5' 8.5" (1.74 m)     Weight: 140 lb 4.8 oz (63.64 kg)     SpO2:  98% 98% 99%    Intake/Output Summary (Last 24 hours) at 05/31/14 0909 Last data filed at 05/31/14 0500  Gross per 24 hour  Intake 1902.5 ml  Output    750 ml  Net 1152.5 ml   Filed Weights   05/30/14 1700  Weight: 140 lb 4.8 oz (63.64 kg)    Subjective Feels well this am. No lightheadedness or dizziness. Denies SOB.  [name] aspirin EC  81 mg Oral Daily  . atorvastatin  10 mg Oral Daily  . clopidogrel  75 mg Oral Daily  . heparin  5,000 Units Subcutaneous 3 times per day  . isosorbide mononitrate  60 mg Oral Daily  . mometasone-formoterol  2 puff Inhalation BID  . tiotropium  18 mcg Inhalation Daily   . sodium chloride 50 mL (05/30/14 1545)    LABS: Basic Metabolic Panel:  Recent Labs  53/74/82 1159  NA 147  K 3.7  CL 109  CO2 27  GLUCOSE 101*  BUN 20  CREATININE 1.89*  CALCIUM 8.7   Liver Function Tests:  Recent Labs  05/30/14 1159  AST 17  ALT 12  ALKPHOS 57  BILITOT 0.3  PROT 6.4  ALBUMIN 3.2*   No results found for this basename: LIPASE, AMYLASE,  in the last 72 hours CBC:  Recent Labs  05/30/14 1159  WBC 4.6  HGB 10.8*  HCT 33.5*  MCV 88.9  PLT 136*   Cardiac Enzymes:  Recent Labs  05/30/14 1159  TROPONINI <0.30   BNP: No results found for this basename: PROBNP,  in the last 72 hours D-Dimer: No results found for this basename: DDIMER,  in the last 72 hours Hemoglobin A1C: No results found for this basename: HGBA1C,  in the last 72 hours Fasting Lipid Panel: No results found for this basename: CHOL, HDL, LDLCALC, TRIG, CHOLHDL, LDLDIRECT,  in the last 72 hours Thyroid Function  Tests: No results found for this basename: TSH, T4TOTAL, FREET3, T3FREE, THYROIDAB,  in the last 72 hours   Radiology/Studies:  Dg Chest 2 View  05/30/2014   CLINICAL DATA:  Multiple syncopal episodes today.  EXAM: CHEST  2 VIEW  COMPARISON:  09/03/2013.  FINDINGS: The cardiac silhouette remains borderline enlarged. The lungs remain clear and hyperexpanded with diffuse peribronchial thickening. Minimal thoracic spine degenerative changes. Diffuse osteopenia.  IMPRESSION: 1. No acute abnormality. 2. Stable borderline cardiomegaly. 3. Stable changes of COPD and chronic bronchitis.   Electronically Signed   By: Gordan Payment M.D.   On: 05/30/2014 13:24    PHYSICAL EXAM General: Well developed, well nourished, in no acute distress. Head: Normocephalic, atraumatic, sclera non-icteric, oropharynx is clear Neck: Negative for carotid bruits. JVD not elevated. No adenopathy Lungs: Clear bilaterally to auscultation without wheezes, rales, or rhonchi. Breathing is unlabored. Heart: RRR S1 S2 without murmurs, rubs, or gallops.  Abdomen: Soft, non-tender, non-distended with normoactive bowel sounds. No hepatomegaly. No rebound/guarding. No obvious abdominal masses. Msk:  Strength and tone appears normal for age. Extremities:  No clubbing, cyanosis or edema.  Distal pedal pulses are 2+ and equal bilaterally. Neuro: Alert and oriented X 3. Moves all extremities spontaneously. Psych:  Responds to questions appropriately with a normal affect.  ASSESSMENT AND PLAN: 1. Syncope. Multifactorial. Suspect dehydration with recent increased diuretic doses as well as diarrhea following laxative use. Patient also significantly bradycardic with initial HR in 30s. Now 45. Continue gentle hydration. Check orthostatic vitals. Advance activity slowly. Will need to DC beta blocker unless he has a pacemaker in place but he still does not want ICD. Holding diuretics and ARB. If creatinine improved will resume ARB.   2. Chronic  systolic CHF. Well compensated currently. No edema. Weight stable from last DC. Will resume diuretics at lower dose once renal function back to baseline.  3. CAD s/p remote stenting of RCA. 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 Will increase Imdur to 60 mg daily.  4. Sinus brady. Beta blocker on hold.   5. NSVT. Lifevest in place. Follow on telemetry.  6. Acute renal failure. Creatinine .9>>1.89. Diuretics on hold. ARB on hold. Awaiting BMET results.  7. HTN. Intolerant of hydralazine. Will resume ARB if renal function improves. Increased nitrates.   8. Hyperlipidemia.  9. COPD. Continue inhalers.   Present on Admission:  . Syncope . Tobacco abuse . NSVT (nonsustained ventricular tachycardia) . Mixed Ischemic/Non-Ischemic Cardiomyopathy . HTN (hypertension) . Chronic systolic heart failure . CAD (coronary artery disease) . Acute renal failure  Signed, Zehra Rucci [place], MDFACC 05/31/2014 9:09 AM

## 2014-06-01 ENCOUNTER — Other Ambulatory Visit: Payer: Medicare Other

## 2014-06-01 LAB — BASIC METABOLIC PANEL
Anion gap: 12 (ref 5–15)
BUN: 14 mg/dL (ref 6–23)
CO2: 24 mEq/L (ref 19–32)
CREATININE: 0.94 mg/dL (ref 0.50–1.35)
Calcium: 8.4 mg/dL (ref 8.4–10.5)
Chloride: 108 mEq/L (ref 96–112)
GFR calc Af Amer: >90 mL/min (ref >90)
GFR, EST NON AFRICAN AMERICAN: 83 mL/min — AB (ref >90)
Glucose, Bld: 91 mg/dL (ref 70–99)
Potassium: 4.1 mEq/L (ref 3.7–5.3)
Sodium: 144 mEq/L (ref 137–147)

## 2014-06-01 MED ORDER — LOSARTAN POTASSIUM 100 MG PO TABS: 100.0000 mg | ORAL_TABLET | Freq: Every day | ORAL | Status: DC

## 2014-06-01 MED ORDER — ISOSORBIDE MONONITRATE ER 60 MG PO TB24: 60.0000 mg | ORAL_TABLET | Freq: Every day | ORAL | Status: DC

## 2014-06-01 MED ORDER — FUROSEMIDE 40 MG PO TABS: 40.0000 mg | ORAL_TABLET | Freq: Every day | ORAL | Status: DC

## 2014-06-01 MED ORDER — LOSARTAN POTASSIUM 50 MG PO TABS
100.0000 mg | ORAL_TABLET | Freq: Every day | ORAL | Status: DC
  Administered 2014-06-01: 100 mg via ORAL
  Filled 2014-06-01: qty 2

## 2014-06-01 MED ORDER — POTASSIUM CHLORIDE CRYS ER 20 MEQ PO TBCR: 20.0000 meq | EXTENDED_RELEASE_TABLET | Freq: Every day | ORAL | Status: DC

## 2014-06-01 MED ORDER — POTASSIUM CHLORIDE CRYS ER 20 MEQ PO TBCR
20.0000 meq | EXTENDED_RELEASE_TABLET | Freq: Every day | ORAL | Status: DC
  Administered 2014-06-01: 20 meq via ORAL
  Filled 2014-06-01: qty 1

## 2014-06-01 MED ORDER — FUROSEMIDE 40 MG PO TABS
40.0000 mg | ORAL_TABLET | Freq: Every day | ORAL | Status: DC
  Administered 2014-06-01: 40 mg via ORAL
  Filled 2014-06-01: qty 1

## 2014-06-01 NOTE — Progress Notes (Signed)
Pt discharged to home per MD order. Pt received and reviewed all discharge instructions and medication information including follow-up appointments and prescription information. Pt verbalized understanding. Pt alert and oriented at discharge with no complaints of pain. Pt IV and telemetry box removed prior to discharge. Pt escorted to private vehicle via wheelchair by guest services volunteer. Philomene Haff C  

## 2014-06-01 NOTE — Discharge Summary (Signed)
Patient seen and examined and history reviewed. Agree with above findings and plan. See prior rounding note.   Peter [place], MDFACC 06/01/2014 2:06 PM

## 2014-06-01 NOTE — Discharge Summary (Signed)
Physician Discharge Summary      Patient ID: [name] MRN: [id number] DOB/AGE: [birth year] 73 y.o.  Admit date: 05/30/2014 Discharge date: 06/01/2014  Admission Diagnoses:  Syncope  Discharge Diagnoses:  Active Problems:   HTN (hypertension)   Tobacco abuse   CAD (coronary artery disease)   Chronic systolic heart failure   Mixed Ischemic/Non-Ischemic Cardiomyopathy   NSVT (nonsustained ventricular tachycardia)   Syncope   Acute Kidney Injury   Sinus bradycardia   Dehydration  Discharged Condition: stable  Hospital Course:   73 y/o male with a h/o CAD s/p prior RCA stenting. He was admitted to [facility] earlier this month in the setting of acute resp failure and VDRF requiring prolonged intubation. He had documented NSVT but ruled out for MI. He underwent cath revealing stable anatomy with patent RCA stent. Echo showed EF of 20-25% with sev global HK - this was down from prior echo in 12/2011 (40-45%). He was placed on coreg, arb, lasix, and spiro. Coreg was titrated to 12.5mg  BID and though he had some bradycardia, he did not have anything documented below 50. He continued to have runs of NSVT and was seen by EP with recommendation for ICD implantation, however he deferred and instead, a lifevest was placed. He was seen in clinic on 9/15 and reported feeling relatively well but with reprot of 4-5 lb wt gain. His lasix was increased to 80 am, 40 pm. He was brady with rates in the 40's and his coreg dose was decreased to 12.5 am, 6.25 pm. Of note, he was previously noted to be brady on 3.125 bid and coreg was d/c'd.   Since office visit, he has only been taking coreg daily - skipping the planned evening dose. He says that 2 days ago, he started feeling constipated and last night took some laxatives. He had 4 bowel mvmts throughout the night. This AM, he got up after 10 to use the bathroom and after stepping into the bathroom, he felt lightheaded, then lost consciousness, and fell to the  floor. He regained consciousness once he was on the floor and stood and placed his hands on the sink. He was still lightheaded and again lost consciousness and fell to the floor. Again, he quickly regained consciousness. At that point his wife called EMS. He was found to be bradycardic with rates in the 30's and was transported to [facility].  Blood pressures were stable, however HR's continue in the high 30's. He is asymptomatic at rest. BUN/Creat were elevated @ 20/1.89 (was nl on last admission). Labs otw unremarkable. He is currently w/o complaints.  He was admitted and coreg was discontinued.  ARB was held due to AKI  The thought was his syncope was multifactorial with bradycardia, dehydration-diarrhea/recent diuretic dose increase.  Lifevest continued, however, the patient still stated he does not want and ICD despite having multiple runs of NSVT 8-20 beats.   The importance of the vest was discussed.  Will need follow up echo in two months.  Orthostatic vitals were negative on the day of discharge.  SCr improved to WNL( 1.89>>1.23>>0.94).  We will continue to ARB as he is intolerant to hydralazine.  Lasix dose was decreased and Imdur was increased.  No BB unless PPM placed.  The patient was seen by Dr. Swaziland who felt he was stable for DC home.     Consults: None  Significant Diagnostic Studies:  CHEST 2 VIEW  COMPARISON: 09/03/2013.  FINDINGS:  The cardiac silhouette remains borderline enlarged. The  lungs remain  clear and hyperexpanded with diffuse peribronchial thickening.  Minimal thoracic spine degenerative changes. Diffuse osteopenia.  IMPRESSION:  1. No acute abnormality.  2. Stable borderline cardiomegaly.  3. Stable changes of COPD and chronic bronchitis.   Treatments: See above  Discharge Exam: Blood pressure 155/59, pulse 48, temperature 98.6 F (37 C), temperature source Oral, resp. rate 18, height 5' 8.5" (1.74 m), weight 145 lb 6.4 oz (65.953 kg), SpO2 98.00%.   Disposition:  06-Home-Health Care Svc      Discharge Instructions   Diet - low sodium heart healthy    Complete by:  As directed      Discharge instructions    Complete by:  As directed   Monitor weight daily.  Low sodium diet.  If you gain three pounds in 24 hours, or five pounds in a week, call the office.     Increase activity slowly    Complete by:  As directed             Medication List    STOP taking these medications       carvedilol 12.5 MG tablet  Commonly known as:  COREG     spironolactone 12.5 mg Tabs tablet  Commonly known as:  ALDACTONE      TAKE these medications       albuterol 108 (90 BASE) MCG/ACT inhaler  Commonly known as:  PROVENTIL HFA;VENTOLIN HFA  Inhale 1 puff into the lungs every 6 (six) hours as needed for wheezing or shortness of breath.     aspirin EC 81 MG tablet  Take 1 tablet (81 mg total) by mouth daily.     atorvastatin 10 MG tablet  Commonly known as:  LIPITOR  Take 1 tablet (10 mg total) by mouth daily.     clopidogrel 75 MG tablet  Commonly known as:  PLAVIX  Take 1 tablet (75 mg total) by mouth daily.     Fluticasone-Salmeterol 250-50 MCG/DOSE Aepb  Commonly known as:  ADVAIR  Inhale 1 puff into the lungs 2 (two) times daily.     furosemide 40 MG tablet  Commonly known as:  LASIX  Take 1 tablet (40 mg total) by mouth daily.     isosorbide mononitrate 60 MG 24 hr tablet  Commonly known as:  IMDUR  Take 1 tablet (60 mg total) by mouth daily.     losartan 100 MG tablet  Commonly known as:  COZAAR  Take 1 tablet (100 mg total) by mouth daily.     nitroGLYCERIN 0.4 MG SL tablet  Commonly known as:  NITROSTAT  Place 1 tablet (0.4 mg total) under the tongue every 5 (five) minutes as needed for chest pain.     potassium chloride SA 20 MEQ tablet  Commonly known as:  K-DUR,KLOR-CON  Take 1 tablet (20 mEq total) by mouth daily.     tiotropium 18 MCG inhalation capsule  Commonly known as:  SPIRIVA HANDIHALER  Place 1 capsule (18 mcg  total) into inhaler and inhale daily.       Follow-up Information   Follow up with [facility] R, NP On 06/14/2014. (9:30 AM)    Specialty:  Cardiology   Contact information:   [address] [place] [address] [phone]      Greater than 30 minutes was spent completing the patient's discharge.    SignedWilburt Finlay, PAC 06/01/2014, 9:16 AM

## 2014-06-01 NOTE — Progress Notes (Signed)
TELEMETRY: Reviewed telemetry pt in sinus brady with rate 50 bpm. Frequent PVCs and bigeminy. Multiple runs of NSVT- typically 8-9 beats. One run of 20 beats. Nurse notes 45 beat run last pm but I cannot find documentation of this.: Filed Vitals:   05/31/14 2332 06/01/14 0253 06/01/14 0509 06/01/14 0732  BP: 163/61  155/59   Pulse: 50  41 48  Temp:   98.6 F (37 C)   TempSrc:   Oral   Resp:   18 18  Height:      Weight:   145 lb 6.4 oz (65.953 kg)   SpO2: 98% 97% 98% 98%    Intake/Output Summary (Last 24 hours) at 06/01/14 0756 Last data filed at 06/01/14 0700  Gross per 24 hour  Intake   1560 ml  Output    350 ml  Net   1210 ml   Filed Weights   05/30/14 1700 06/01/14 0509  Weight: 140 lb 4.8 oz (63.64 kg) 145 lb 6.4 oz (65.953 kg)    Subjective Feels well this am. No lightheadedness or dizziness. Denies SOB. Is more concerned about "nightmares" that he has had for several years.   [name] aspirin EC  81 mg Oral Daily  . atorvastatin  10 mg Oral Daily  . clopidogrel  75 mg Oral Daily  . heparin  5,000 Units Subcutaneous 3 times per day  . isosorbide mononitrate  60 mg Oral Daily  . losartan  50 mg Oral Daily  . mometasone-formoterol  2 puff Inhalation BID  . tiotropium  18 mcg Inhalation Daily   . sodium chloride 50 mL (05/30/14 1545)    LABS: Basic Metabolic Panel:  Recent Labs  19/14/78 0837 06/01/14 0527  NA 142 144  K 3.4* 4.1  CL 105 108  CO2 22 24  GLUCOSE 127* 91  BUN 18 14  CREATININE 1.23 0.94  CALCIUM 8.6 8.4   Liver Function Tests:  Recent Labs  05/30/14 1159  AST 17  ALT 12  ALKPHOS 57  BILITOT 0.3  PROT 6.4  ALBUMIN 3.2*   No results found for this basename: LIPASE, AMYLASE,  in the last 72 hours CBC:  Recent Labs  05/30/14 1159  WBC 4.6  HGB 10.8*  HCT 33.5*  MCV 88.9  PLT 136*   Cardiac Enzymes:  Recent Labs  05/30/14 1159  TROPONINI <0.30   BNP: No results found for this basename: PROBNP,  in the last 72  hours D-Dimer: No results found for this basename: DDIMER,  in the last 72 hours Hemoglobin A1C: No results found for this basename: HGBA1C,  in the last 72 hours Fasting Lipid Panel: No results found for this basename: CHOL, HDL, LDLCALC, TRIG, CHOLHDL, LDLDIRECT,  in the last 72 hours Thyroid Function Tests: No results found for this basename: TSH, T4TOTAL, FREET3, T3FREE, THYROIDAB,  in the last 72 hours   Radiology/Studies:  Dg Chest 2 View  05/30/2014   CLINICAL DATA:  Multiple syncopal episodes today.  EXAM: CHEST  2 VIEW  COMPARISON:  09/03/2013.  FINDINGS: The cardiac silhouette remains borderline enlarged. The lungs remain clear and hyperexpanded with diffuse peribronchial thickening. Minimal thoracic spine degenerative changes. Diffuse osteopenia.  IMPRESSION: 1. No acute abnormality. 2. Stable borderline cardiomegaly. 3. Stable changes of COPD and chronic bronchitis.   Electronically Signed   By: Gordan Payment M.D.   On: 05/30/2014 13:24    PHYSICAL EXAM General: Well developed, well nourished, in no acute distress. Head: Normocephalic, atraumatic, sclera  non-icteric, oropharynx is clear Neck: Negative for carotid bruits. JVD not elevated. No adenopathy Lungs: Clear bilaterally to auscultation without wheezes, rales, or rhonchi. Breathing is unlabored. Heart: RRR S1 S2 without murmurs, rubs, or gallops.  Abdomen: Soft, non-tender, non-distended with normoactive bowel sounds. No hepatomegaly. No rebound/guarding. No obvious abdominal masses. Msk:  Strength and tone appears normal for age. Extremities: No clubbing, cyanosis or edema.  Distal pedal pulses are 2+ and equal bilaterally. Neuro: Alert and oriented X 3. Moves all extremities spontaneously. Psych:  Responds to questions appropriately with a normal affect.  ASSESSMENT AND PLAN: 1. Syncope. Multifactorial. Suspect dehydration with recent increased diuretic doses as well as diarrhea following laxative use. Patient also  significantly bradycardic with initial HR in 30s. Now 50. NSVT. He is not orthostatic. Dehydration now corrected and renal function back to normal. Will resume losartan 100 mg daily. Resume Lasix 40 mg daily with potassium. Continue to hold aldactone for now.  Will need to DC beta blocker unless he has a pacemaker in place.   2. Chronic systolic CHF. Well compensated currently. No edema. Weight up with hydration. Will resume diuretics at lower dose.   3. CAD s/p remote stenting of RCA. 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 Increased Imdur to 60 mg daily.  4. Sinus brady. Beta blocker on hold. Will not resume. I don't think he is a candidate for beta blocker unless he has a pacemaker in place.  5. NSVT. Lifevest in place. Explained to patient rationale for Lifevest. Would continue until EF can be reassessed in 2 months on medical therapy.   6. Acute renal failure. Creatinine .9>>1.89. Diuretics on hold. ARB on hold. Awaiting BMET results.  7. HTN. Intolerant of hydralazine. Will resume ARB if renal function improves. Increased nitrates.   8. Hyperlipidemia.  9. COPD. Continue inhalers.   Patient is stable for DC today. Needs a note to return to work next week. Will need close follow up as outpatient with BMET.   Present on Admission:  . Syncope . Tobacco abuse . NSVT (nonsustained ventricular tachycardia) . Mixed Ischemic/Non-Ischemic Cardiomyopathy . HTN (hypertension) . Chronic systolic heart failure . CAD (coronary artery disease) . Acute renal failure  Signed, Peter Swaziland, MDFACC 06/01/2014 7:56 AM

## 2014-06-04 NOTE — H&P (Signed)
**NOTE ORIGINALLY LISTED AS CONSULT ON 05/30/2014**     Patient Information    Patient Name Sex DOB SSN    Barlett, Verbon A Male 09/08/1941 [id number]     CARDIOLOGY HISTORY & PHYSICAL  Patient ID: Cung A Dibella MRN: [id number], DOB/AGE: 12/30/1940, AGE: 73   Admit date: 05/30/2014 Date of Consult: 05/30/2014  Primary Physician: Valerie Leschber, MD Primary Cardiologist: P. Jordan, MD / J. Allred, MD   Pt. Profile  73 y/o male with a h/o CAD s/p recent admission 2/2 VDRF who presented to the ED today following syncope x 2.  Problem List    Past Medical History   Diagnosis  Date   .  Dyslipidemia         a. on statin   .  LBBB (left bundle branch block)     .  CAD (coronary artery disease)         a. s/p stent RCA DES 9/09; b. 2014 Attempted PCI of OM1 @ [place] [facility];  c. 04/2014 Cath: LAD 40-50p, D1 95-99 (chronic), LCX 30-40 inf branch, OM1 CTO, RCA 30-40p, RCA patent stent, EF 35%->Med Rx.   .  HTN (hypertension)         a. Reports intolerance to hydralazine; b. no beta blockers 2/2 bradycardia;  c. failed on ACE and ARB.   .  Noncompliance     .  Chronic combined systolic and diastolic CHF (congestive heart failure)         a. EF about 40 to 45% per echo in April 2013;  b. 04/2014 Echo: EF 20-25%, sev LVH, sev glob HK, Gr 1 DD, mildly reduced RV fxn, PASP 38mmHg.   .  LVH (left ventricular hypertrophy)     .  COPD (chronic obstructive pulmonary disease)  dx 06/2013       PFTs 07/08/13: mod obst with resp to bronchodilator, moderate decrease diffusion, airtrapping   .  Chronic low back pain     .  Mixed Ischemic/Non-ischemic Cardiomyopathy         a. 04/2014 Echo: EF 20-25%, sev glob HK.   .  HTN (hypertension), malignant  05/08/2014   .  NSVT (nonsustained ventricular tachycardia)         a. LifeVest applied 04/2014 admission->refused ICD placement.   .  HLD (hyperlipidemia)         Past Surgical History   Procedure  Laterality  Date   .  Cardiac catheterization           ejection  fraction 50%    Allergies    Allergies   Allergen  Reactions   .  Aspirin  Other (See Comments)       GI upset   .  Influenza Vaccines  Other (See Comments)       Pt declines   .  Pneumococcal Vaccines  Other (See Comments)       Pt declines    HPI   73 y/o male with the above complex problem list.  He has a h/o CAD s/p prior RCA stenting.  He was admitted to [facility] earlier this month in the setting of acute resp failure and VDRF requiring prolonged intubation.  He had documented NSVT but ruled out for MI.  He underwent cath revealing stable anatomy with patent RCA stent. Echo showed EF of 20-25% with sev global HK - this was down from prior echo in 12/2011 (40-45%).  He was placed on coreg, arb, lasix, and spiro.  Coreg was titrated   to 12.5mg BID and though he had some bradycardia, he did not have anything documented below 50.  He continued to have runs of NSVT and was seen by EP with recommendation for  ICD implantation, however he deferred and instead, a lifevest was placed. He was seen in clinic on 9/15 and reported feeling relatively well but with reprot of 4-5 lb wt gain.  His lasix was increased to 80 am, 40 pm.  He was brady with rates in the 40's and his coreg dose was decreased to 12.5 am, 6.25 pm.  Of note, he was previously noted to be brady on 3.125 bid and coreg was d/c'd.  Since office visit, he has only been taking coreg daily - skipping the planned evening dose.  He says that 2 days ago, he started feeling constipated and last night took some laxatives.  He had 4 bowel mvmts throughout the night.  This AM, he got up after 10 to use the bathroom and after stepping into the bathroom, he felt lightheaded, then lost consciousness, and fell to the floor.  He regained consciousness once he was on the floor and stood and placed his hands on the sink.  He was still lightheaded and again lost consciousness and fell to the floor.  Again, he quickly regained consciousness.  At that point his wife  called EMS.  He was found to be bradycardic with rates in the 30's and was transported to [facility].  Here, blood pressures have been stable however HR's continue in the high 30's.  He is asymptomatic at rest.  BUN/Creat has just returned elevated @ 20/1.89 (was nl on last admission).  Labs otw unremarkable.  He is currently w/o complaints.  Home Medications    Prior to Admission medications    Medication  Sig  Start Date  End Date  Taking?  Authorizing Provider   albuterol (PROVENTIL HFA;VENTOLIN HFA) 108 (90 BASE) MCG/ACT inhaler  Inhale 1 puff into the lungs every 6 (six) hours as needed for wheezing or shortness of breath.  12/24/13    Yes  Peter M Jordan, MD   aspirin EC 81 MG tablet  Take 1 tablet (81 mg total) by mouth daily.  08/24/13    Yes  Valerie A Leschber, MD   atorvastatin (LIPITOR) 10 MG tablet  Take 1 tablet (10 mg total) by mouth daily.  06/18/13  06/18/14  Yes  Peter M Jordan, MD   carvedilol (COREG) 12.5 MG tablet  Take 12.5 mg by mouth daily.       Yes  Historical Provider, MD   clopidogrel (PLAVIX) 75 MG tablet  Take 1 tablet (75 mg total) by mouth daily.  06/18/13    Yes  Peter M Jordan, MD   Fluticasone-Salmeterol (ADVAIR) 250-50 MCG/DOSE AEPB  Inhale 1 puff into the lungs 2 (two) times daily.      Yes  Historical Provider, MD   furosemide (LASIX) 40 MG tablet  Take 1 tablet (40 mg total) by mouth 2 (two) times daily.  05/18/14    Yes  Hao Meng, PA   isosorbide mononitrate (IMDUR) 30 MG 24 hr tablet  Take 30 mg by mouth daily.      Yes  Historical Provider, MD   losartan (COZAAR) 100 MG tablet  Take 50 mg by mouth 2 (two) times daily.       Yes  Historical Provider, MD   nitroGLYCERIN (NITROSTAT) 0.4 MG SL tablet  Place 1 tablet (0.4 mg total) under the tongue   every 5 (five) minutes as needed for chest pain.  09/05/13  12/18/14  Yes  Anand D Hongalgi, MD   potassium chloride SA (K-DUR,KLOR-CON) 20 MEQ tablet  Take 20 mEq by mouth 2 (two) times daily.      Yes  Historical Provider, MD     spironolactone (ALDACTONE) 12.5 mg TABS tablet  Take 0.5 tablets (12.5 mg total) by mouth daily.  05/18/14    Yes  Hao Meng, PA   tiotropium (SPIRIVA HANDIHALER) 18 MCG inhalation capsule  Place 1 capsule (18 mcg total) into inhaler and inhale daily.  07/31/13    Yes  Valerie A Leschber, MD     Family History Family History   Problem  Relation  Age of Onset   .  Heart attack  Neg Hx     .  Stroke  Neg Hx       Social History History      Social History   .  Marital Status:  Married       Spouse Name:  N/A       Number of Children:  3   .  Years of Education:  N/A     Occupational History   .  security guard.        Social History Main Topics   .  Smoking status:  Former Smoker -- 0.50 packs/day for 56 years       Types:  Cigarettes       Quit date:  09/10/2012   .  Smokeless tobacco:  Not on file   .  Alcohol Use:  No   .  Drug Use:  No   .  Sexual Activity:  Not Currently      Other Topics  Concern   .  Not on file     Review of Systems  General:  No chills, fever, night sweats or weight changes.   Cardiovascular:  Syncope as outlined above.  No chest pain, dyspnea on exertion, edema, orthopnea, palpitations, paroxysmal nocturnal dyspnea. Dermatological: No rash, lesions/masses Respiratory: No cough, dyspnea Urologic: No hematuria, dysuria Abdominal:   +++constipation. No nausea, vomiting, diarrhea, bright red blood per rectum, melena, or hematemesis Neurologic:  No visual changes, wkns, changes in mental status. All other systems reviewed and are otherwise negative except as noted above.  Physical Exam  Blood pressure 118/51, pulse 38, temperature 98 F (36.7 C), temperature source Oral, resp. rate 14, SpO2 99.00%.  General: Pleasant, NAD Psych: Normal affect. Neuro: Alert and oriented X 3. Moves all extremities spontaneously. HEENT: Normal         Neck: Supple without bruits or JVD. Lungs:  Resp regular and unlabored, CTA. Heart: RRR no s3, s4, or  murmurs. Abdomen: Soft, non-tender, non-distended, BS + x 4.   Extremities: No clubbing, cyanosis or edema. DP/PT/Radials 2+ and equal bilaterally. Skin: dry/cracking on feet.  Mld tenting of hand skin  Labs    Lab Results   Component  Value  Date     TROPONINI  <0.30  05/30/2014     Lab Results   Component  Value  Date     WBC  4.6  05/30/2014     HGB  10.8*  05/30/2014     HCT  33.5*  05/30/2014     MCV  88.9  05/30/2014     PLT  136*  05/30/2014     Recent Labs Lab  05/24/14 1357   NA  142   K  3.7     CL  104   CO2  28   BUN  12   CREATININE  0.9   CALCIUM  9.4   GLUCOSE  69*    Radiology/Studies  CXR pending  ECG  Sinus brady, 38, 1st deg avb, lbbb.  ASSESSMENT AND PLAN  1.  Syncope:  Pt with two syncopal episodes this AM after diarrhea overnight.  Syncope is likely multifactorial in the setting of dehydration (BUN/Creat up to 20/1.89 with recent adjustment of home lasix dose on top of spiro and ARB and with recent loose BM) along with bradycardia with rates in the high 30's currently - though he is asymptomatic at rest..  He was previously on 3.125 mg of coreg BID and this was discontinued 2/2 bradycardia.  During last hospitalization, coreg was resumed and titrated to 12.5mg bid.  On recent clinic visit, it was reduced to 12.5 am, 6.25 pm, though pt has only been taking 12.5 daily.  Will plan to admit, hold coreg, lasix, spiro, and acei.  Gently hydrate.  Follow tele.  When HR stabilizes, could consider resumption of low-dose metoprolol succinate vs discontinuation of bb altogether.  Will need to carefully add back diuretics.  2.  Chronic systolic CHF/Mixed ICM/NIMC:  Dry on exam with elevated BUN/Creat.  As above, hold diuretics, bb, and arb.  Follow.  3.  CAD:  No chest pain.  Recent cath revealing stable anatomy with patent RCA stent. Cont ASA, plavix, and statin.  4.  Sinus Bradycardia:  Hold coreg.  Prior h/o brady on coreg 3.125.  Will have to reconsider our bb  options prior to resuming.  5.  Acute renal failure:  In setting of diuretic/arb therapy.  Hold diuretics and ARB.  Gently hydrate today.  Follow.  6.  NSVT:  LifeVest in place.  Follow on tele with BB on hold.  7.  HTN:  Stable.  Follow off of meds.  8.  HL:  Cont statin.  9.  COPD:  Cont inhalers.  Signed, Camarion Weier, NP 05/30/2014, 12:57 PM  Patient seen and examined  Agree with findings of C Kenli Waldo above  I have amended to reflect my findings.  HOld b blocker for now  Hydrate  Telemetry.    Paula Ross             

## 2014-06-04 NOTE — Consult Note (Deleted)
**NOTE ORIGINALLY LISTED AS CONSULT ON 05/30/2014**     Patient Information    Patient Name Sex DOB SSN    [name], [name] Male 06/20/1941 [name]     CARDIOLOGY HISTORY & PHYSICAL  Patient ID: [name] MRN: [id number], DOB/AGE: [birth year]   Admit date: 05/30/2014 Date of Consult: 05/30/2014  Primary Physician: Rene Paci, MD Primary Cardiologist: P. Swaziland, MD / Shela Commons. Allred, MD   Pt. Profile  73 y/o male with a h/o CAD s/p recent admission 2/2 VDRF who presented to the ED today following syncope x 2.  Problem List    Past Medical History   Diagnosis  Date   .  Dyslipidemia         a. on statin   .  LBBB (left bundle branch block)     .  CAD (coronary artery disease)         a. s/p stent RCA DES 9/09; b. 2014 Attempted PCI of OM1 @ [place];  c. 04/2014 Cath: LAD 40-50p, D1 95-99 (chronic), LCX 30-40 inf branch, OM1 CTO, RCA 30-40p, RCA patent stent, EF 35%->Med Rx.   .  HTN (hypertension)         a. Reports intolerance to hydralazine; b. no beta blockers 2/2 bradycardia;  c. failed on ACE and ARB.   [name]  Noncompliance     .  Chronic combined systolic and diastolic CHF (congestive heart failure)         a. EF about 40 to 45% per echo in April 2013;  b. 04/2014 Echo: EF 20-25%, sev LVH, sev glob HK, Gr 1 DD, mildly reduced RV fxn, PASP .   [name]  LVH (left ventricular hypertrophy)     .  COPD (chronic obstructive pulmonary disease)  dx 06/2013       PFTs 07/08/13: mod obst with resp to bronchodilator, moderate decrease diffusion, airtrapping   .  Chronic low back pain     .  Mixed Ischemic/Non-ischemic Cardiomyopathy         a. 04/2014 Echo: EF 20-25%, sev glob HK.   [name]  HTN (hypertension), malignant  05/08/2014   .  NSVT (nonsustained ventricular tachycardia)         a. LifeVest applied 04/2014 admission->refused ICD placement.   [name]  HLD (hyperlipidemia)         Past Surgical History   Procedure  Laterality  Date   .  Cardiac catheterization           ejection  fraction 50%    Allergies    Allergies   Allergen  Reactions   .  Aspirin  Other (See Comments)       GI upset   .  Influenza Vaccines  Other (See Comments)       Pt declines   .  Pneumococcal Vaccines  Other (See Comments)       Pt declines    HPI   73 y/o male with the above complex problem list.  He has a h/o CAD s/p prior RCA stenting.  He was admitted to [facility] earlier this month in the setting of acute resp failure and VDRF requiring prolonged intubation.  He had documented NSVT but ruled out for MI.  He underwent cath revealing stable anatomy with patent RCA stent. Echo showed EF of 20-25% with sev global HK - this was down from prior echo in 12/2011 (40-45%).  He was placed on coreg, arb, lasix, and spiro.  Coreg was titrated  to 12.5mg  BID and though he had some bradycardia, he did not have anything documented below 50.  He continued to have runs of NSVT and was seen by EP with recommendation for  ICD implantation, however he deferred and instead, a lifevest was placed. He was seen in clinic on 9/15 and reported feeling relatively well but with reprot of 4-5 lb wt gain.  His lasix was increased to 80 am, 40 pm.  He was brady with rates in the 40's and his coreg dose was decreased to 12.5 am, 6.25 pm.  Of note, he was previously noted to be brady on 3.125 bid and coreg was d/c'd.  Since office visit, he has only been taking coreg daily - skipping the planned evening dose.  He says that 2 days ago, he started feeling constipated and last night took some laxatives.  He had 4 bowel mvmts throughout the night.  This AM, he got up after 10 to use the bathroom and after stepping into the bathroom, he felt lightheaded, then lost consciousness, and fell to the floor.  He regained consciousness once he was on the floor and stood and placed his hands on the sink.  He was still lightheaded and again lost consciousness and fell to the floor.  Again, he quickly regained consciousness.  At that point his wife  called EMS.  He was found to be bradycardic with rates in the 30's and was transported to [facility].  Here, blood pressures have been stable however HR's continue in the high 30's.  He is asymptomatic at rest.  BUN/Creat has just returned elevated @ 20/1.89 (was nl on last admission).  Labs otw unremarkable.  He is currently w/o complaints.  Home Medications    Prior to Admission medications    Medication  Sig  Start Date  End Date  Taking?  Authorizing Provider   albuterol (PROVENTIL HFA;VENTOLIN HFA) 108 (90 BASE) MCG/ACT inhaler  Inhale 1 puff into the lungs every 6 (six) hours as needed for wheezing or shortness of breath.  12/24/13    Yes  Peter M Swaziland, MD   aspirin EC 81 MG tablet  Take 1 tablet (81 mg total) by mouth daily.  08/24/13    Yes  Newt Lukes, MD   atorvastatin (LIPITOR) 10 MG tablet  Take 1 tablet (10 mg total) by mouth daily.  06/18/13  06/18/14  Yes  Peter M Swaziland, MD   carvedilol (COREG) 12.5 MG tablet  Take 12.5 mg by mouth daily.       Yes  Historical Provider, MD   clopidogrel (PLAVIX) 75 MG tablet  Take 1 tablet (75 mg total) by mouth daily.  06/18/13    Yes  Peter M Swaziland, MD   Fluticasone-Salmeterol (ADVAIR) 250-50 MCG/DOSE AEPB  Inhale 1 puff into the lungs 2 (two) times daily.      Yes  Historical Provider, MD   furosemide (LASIX) 40 MG tablet  Take 1 tablet (40 mg total) by mouth 2 (two) times daily.  05/18/14    Yes  Azalee Course, PA   isosorbide mononitrate (IMDUR) 30 MG 24 hr tablet  Take 30 mg by mouth daily.      Yes  Historical Provider, MD   losartan (COZAAR) 100 MG tablet  Take 50 mg by mouth 2 (two) times daily.       Yes  Historical Provider, MD   nitroGLYCERIN (NITROSTAT) 0.4 MG SL tablet  Place 1 tablet (0.4 mg total) under the tongue  every 5 (five) minutes as needed for chest pain.  09/05/13  12/18/14  Yes  Elease Etienne, MD   potassium chloride SA (K-DUR,KLOR-CON) 20 MEQ tablet  Take 20 mEq by mouth 2 (two) times daily.      Yes  Historical Provider, MD     spironolactone (ALDACTONE) 12.5 mg TABS tablet  Take 0.5 tablets (12.5 mg total) by mouth daily.  05/18/14    Yes  Azalee Course, PA   tiotropium (SPIRIVA HANDIHALER) 18 MCG inhalation capsule  Place 1 capsule (18 mcg total) into inhaler and inhale daily.  07/31/13    Yes  Newt Lukes, MD     Family History Family History   Problem  Relation  Age of Onset   .  Heart attack  Neg Hx     .  Stroke  Neg Hx       Social History History      Social History   .  Marital Status:  Married       Spouse Name:  N/A       Number of Children:  3   .  Years of Education:  N/A     Occupational History   .  security guard.        Social History Main Topics   .  Smoking status:  Former Smoker -- 0.50 packs/day for 56 years       Types:  Cigarettes       Quit date:  09/10/2012   .  Smokeless tobacco:  Not on file   .  Alcohol Use:  No   .  Drug Use:  No   .  Sexual Activity:  Not Currently      Other Topics  Concern   .  Not on file     Review of Systems  General:  No chills, fever, night sweats or weight changes.   Cardiovascular:  Syncope as outlined above.  No chest pain, dyspnea on exertion, edema, orthopnea, palpitations, paroxysmal nocturnal dyspnea. Dermatological: No rash, lesions/masses Respiratory: No cough, dyspnea Urologic: No hematuria, dysuria Abdominal:   +++constipation. No nausea, vomiting, diarrhea, bright red blood per rectum, melena, or hematemesis Neurologic:  No visual changes, wkns, changes in mental status. All other systems reviewed and are otherwise negative except as noted above.  Physical Exam  Blood pressure 118/51, pulse 38, temperature 98 F (36.7 C), temperature source Oral, resp. rate 14, SpO2 99.00%.  General: Pleasant, NAD Psych: Normal affect. Neuro: Alert and oriented X 3. Moves all extremities spontaneously. HEENT: Normal         Neck: Supple without bruits or JVD. Lungs:  Resp regular and unlabored, CTA. Heart: RRR no s3, s4, or  murmurs. Abdomen: Soft, non-tender, non-distended, BS + x 4.   Extremities: No clubbing, cyanosis or edema. DP/PT/Radials 2+ and equal bilaterally. Skin: dry/cracking on feet.  Mld tenting of hand skin  Labs    Lab Results   Component  Value  Date     TROPONINI  <0.30  05/30/2014     Lab Results   Component  Value  Date     WBC  4.6  05/30/2014     HGB  10.8*  05/30/2014     HCT  33.5*  05/30/2014     MCV  88.9  05/30/2014     PLT  136*  05/30/2014     Recent Labs Lab  05/24/14 1357   NA  142   K  3.7  CL  104   CO2  28   BUN  12   CREATININE  0.9   CALCIUM  9.4   GLUCOSE  69*    Radiology/Studies  CXR pending  ECG  Sinus brady, 38, 1st deg avb, lbbb.  ASSESSMENT AND PLAN  1.  Syncope:  Pt with two syncopal episodes this AM after diarrhea overnight.  Syncope is likely multifactorial in the setting of dehydration (BUN/Creat up to 20/1.89 with recent adjustment of home lasix dose on top of spiro and ARB and with recent loose BM) along with bradycardia with rates in the high 30's currently - though he is asymptomatic at rest..  He was previously on 3.125 mg of coreg BID and this was discontinued 2/2 bradycardia.  During last hospitalization, coreg was resumed and titrated to 12.5mg  bid.  On recent clinic visit, it was reduced to 12.5 am, 6.25 pm, though pt has only been taking 12.5 daily.  Will plan to admit, hold coreg, lasix, spiro, and acei.  Gently hydrate.  Follow tele.  When HR stabilizes, could consider resumption of low-dose metoprolol succinate vs discontinuation of bb altogether.  Will need to carefully add back diuretics.  2.  Chronic systolic CHF/Mixed ICM/NIMC:  Dry on exam with elevated BUN/Creat.  As above, hold diuretics, bb, and arb.  Follow.  3.  CAD:  No chest pain.  Recent cath revealing stable anatomy with patent RCA stent. Cont ASA, plavix, and statin.  4.  Sinus Bradycardia:  Hold coreg.  Prior h/o brady on coreg 3.125.  Will have to reconsider our bb  options prior to resuming.  5.  Acute renal failure:  In setting of diuretic/arb therapy.  Hold diuretics and ARB.  Gently hydrate today.  Follow.  6.  NSVT:  LifeVest in place.  Follow on tele with BB on hold.  7.  HTN:  Stable.  Follow off of meds.  8.  HL:  Cont statin.  9.  COPD:  Cont inhalers.  Signed, Nicolasa Ducking, NP 05/30/2014, 12:57 PM  Patient seen and examined  Agree with findings of C Brion Aliment above  I have amended to reflect my findings.  HOld b blocker for now  [employer].    Dietrich Pates

## 2014-06-10 ENCOUNTER — Other Ambulatory Visit: Payer: Self-pay | Admitting: *Deleted

## 2014-06-10 MED ORDER — CLOPIDOGREL BISULFATE 75 MG PO TABS: 75.0000 mg | ORAL_TABLET | Freq: Every day | ORAL | Status: DC

## 2014-06-14 ENCOUNTER — Encounter: Payer: Self-pay | Admitting: Cardiology

## 2014-06-14 ENCOUNTER — Ambulatory Visit (INDEPENDENT_AMBULATORY_CARE_PROVIDER_SITE_OTHER): Payer: Medicare Other | Admitting: Cardiology

## 2014-06-14 VITALS — BP 150/78 | HR 71 | Ht 68.0 in | Wt 144.3 lb

## 2014-06-14 DIAGNOSIS — I2510 Atherosclerotic heart disease of native coronary artery without angina pectoris: Secondary | ICD-10-CM

## 2014-06-14 DIAGNOSIS — I10 Essential (primary) hypertension: Secondary | ICD-10-CM

## 2014-06-14 DIAGNOSIS — I5022 Chronic systolic (congestive) heart failure: Secondary | ICD-10-CM

## 2014-06-14 DIAGNOSIS — R0602 Shortness of breath: Secondary | ICD-10-CM

## 2014-06-14 DIAGNOSIS — I208 Other forms of angina pectoris: Secondary | ICD-10-CM

## 2014-06-14 DIAGNOSIS — Z720 Tobacco use: Secondary | ICD-10-CM

## 2014-06-14 DIAGNOSIS — I200 Unstable angina: Secondary | ICD-10-CM

## 2014-06-14 DIAGNOSIS — R001 Bradycardia, unspecified: Secondary | ICD-10-CM

## 2014-06-14 LAB — BASIC METABOLIC PANEL
BUN: 17 mg/dL (ref 6–23)
CALCIUM: 9.4 mg/dL (ref 8.4–10.5)
CO2: 28 mEq/L (ref 19–32)
CREATININE: 1.07 mg/dL (ref 0.50–1.35)
Chloride: 101 mEq/L (ref 96–112)
GLUCOSE: 93 mg/dL (ref 70–99)
Potassium: 3.8 mEq/L (ref 3.5–5.3)
SODIUM: 140 meq/L (ref 135–145)

## 2014-06-14 MED ORDER — ISOSORBIDE MONONITRATE ER 60 MG PO TB24: ORAL_TABLET | ORAL | Status: DC

## 2014-06-14 MED ORDER — CLOPIDOGREL BISULFATE 75 MG PO TABS
75.0000 mg | ORAL_TABLET | Freq: Every day | ORAL | Status: DC
Start: 2014-06-14 — End: 2014-08-11

## 2014-06-14 NOTE — Progress Notes (Signed)
06/15/2014   PCP: Rene Paci, MD   Chief Complaint  Patient presents with  . post hospital    patient complains of some SOB and chest tightness since leavung the hospital.    Primary Cardiologist: Dr. P. Swaziland   HPI: 73 year old Male with a h/o CAD s/p prior RCA stenting. He was admitted to [facility] earlier in August/Sept  in the setting of acute resp failure and VDRF requiring prolonged intubation. He had documented NSVT but ruled out for MI. He underwent cath revealing stable anatomy with patent RCA stent. Echo showed EF of 20-25% with sev global HK - this was down from prior echo in 12/2011 (40-45%). He was placed on coreg, arb, lasix, and spiro. Coreg was titrated to 12.5mg  BID and though he had some bradycardia, he did not have anything documented below 50. He continued to have runs of NSVT and was seen by EP with recommendation for ICD implantation, however he deferred and instead, a lifevest was placed. He was seen in clinic on 9/15 and reported feeling relatively well but with reprot of 4-5 lb wt gain. His lasix was increased to 80 am, 40 pm. He was brady with rates in the 40's and his coreg dose was decreased to 12.5 am, 6.25 pm. Of note, he was previously noted to be brady on 3.125 bid and coreg was d/c'd.   Since office visit, he had only been taking coreg daily - skipping the planned evening dose. Then 2 days prior to second admit, he started feeling constipated and last night took some laxatives. He had 4 bowel mvmts throughout the night, then he got up after 10 to use the bathroom and after stepping into the bathroom, he felt lightheaded, then lost consciousness, and fell to the floor. He regained consciousness once he was on the floor and stood and placed his hands on the sink. He was still lightheaded and again lost consciousness and fell to the floor. Again, he quickly regained consciousness. At that point his wife called EMS. He was found to be bradycardic with  rates in the 30's and was transported to [facility]. Blood pressures were stable, however HR's continue in the high 30's. He is asymptomatic at rest.  He was admitted and coreg was discontinued. ARB was held due to AKI The thought was his syncope was multifactorial with bradycardia, dehydration-diarrhea/recent diuretic dose increase. Lifevest continued, however, the patient still stated he does not want an ICD despite having multiple runs of NSVT 8-20 beats. The importance of the vest was discussed. Will need follow up echo in two months. Orthostatic vitals were negative on the day of discharge. SCr improved to WNL( 1.89>>1.23>>0.94). We will continue to ARB as he is intolerant to hydralazine. Lasix dose was decreased and Imdur was increased. No BB unless PPM placed.   Today he is back for post hospital follow up.  We discussed life vest and PPM/ICD and importance, he is worried that it will take him a long time to recover from placement, I tried to reassure him that he should do well.  But to wear life vest for now and we hope that his heart will develop improved pump.  He will need echo in Dec.   Today he complains of episodic chest pain.  Occurs with walking at work, 2-3 times per day and relief with either rest or NTG sl.  At night on rare occ. The chest tightness wakens him form sleep  and he takes 1 NTG and pain relieves.   + SOB, no nausea.  Also he has been taking lasix twice a day instead of once daily.  I have asked him to decrease to once daily.  He did resume tobacco but did not feel well smoking so has now stopped.  Positive reinforcement given.     Allergies  Allergen Reactions  . Aspirin Other (See Comments)    GI upset at high doses only.  . Influenza Vaccines Other (See Comments)    Pt declines  . Pneumococcal Vaccines Other (See Comments)    Pt declines    Current Outpatient Prescriptions  Medication Sig Dispense Refill  . albuterol (PROVENTIL HFA;VENTOLIN HFA) 108 (90 BASE) MCG/ACT  inhaler Inhale 1 puff into the lungs every 6 (six) hours as needed for wheezing or shortness of breath.  1 Inhaler  6  . aspirin EC 81 MG tablet Take 1 tablet (81 mg total) by mouth daily.  150 tablet  2  . atorvastatin (LIPITOR) 10 MG tablet Take 1 tablet (10 mg total) by mouth daily.  90 tablet  3  . clopidogrel (PLAVIX) 75 MG tablet Take 1 tablet (75 mg total) by mouth daily.  90 tablet  1  . Fluticasone-Salmeterol (ADVAIR) 250-50 MCG/DOSE AEPB Inhale 1 puff into the lungs 2 (two) times daily.      . furosemide (LASIX) 40 MG tablet Take 1 tablet (40 mg total) by mouth daily.  30 tablet  5  . isosorbide mononitrate (IMDUR) 60 MG 24 hr tablet Take 60 mg by mouth daily.      [name]. losartan (COZAAR) 100 MG tablet Take 1 tablet (100 mg total) by mouth daily.  30 tablet  5  . nitroGLYCERIN (NITROSTAT) 0.4 MG SL tablet Place 1 tablet (0.4 mg total) under the tongue every 5 (five) minutes as needed for chest pain.  30 tablet  0  . potassium chloride SA (K-DUR,KLOR-CON) 20 MEQ tablet Take 1 tablet (20 mEq total) by mouth daily.  30 tablet  5  . tiotropium (SPIRIVA HANDIHALER) 18 MCG inhalation capsule Place 1 capsule (18 mcg total) into inhaler and inhale daily.  30 capsule  11  . isosorbide mononitrate (IMDUR) 60 MG 24 hr tablet Take 1 & 1/2 tablet qam  45 tablet  11   No current facility-administered medications for this visit.    Past Medical History  Diagnosis Date  . Dyslipidemia     a. on statin  . LBBB (left bundle branch block)   . CAD (coronary artery disease)     a. s/p stent RCA DES 9/09; b. 2014 Attempted PCI of OM1 @ [place];  c. 04/2014 Cath: LAD 40-50p, D1 95-99 (chronic), LCX 30-40 inf branch, OM1 CTO, RCA 30-40p, RCA patent stent, EF 35%->Med Rx.  . HTN (hypertension)     a. Reports intolerance to hydralazine; b. no beta blockers 2/2 bradycardia;  c. failed on ACE and ARB.  [name]. Noncompliance   . Chronic combined systolic and diastolic CHF (congestive heart failure)     a. EF about  40 to 45% per echo in April 2013;  b. 04/2014 Echo: EF 20-25%, sev LVH, sev glob HK, Gr 1 DD, mildly reduced RV fxn, PASP 38mmHg.  [name]. LVH (left ventricular hypertrophy)   . COPD (chronic obstructive pulmonary disease) dx 06/2013    PFTs 07/08/13: mod obst with resp to bronchodilator, moderate decrease diffusion, airtrapping  . Chronic low back pain   . Mixed Ischemic/Non-ischemic Cardiomyopathy  a. 04/2014 Echo: EF 20-25%, sev glob HK.  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 NSVT (nonsustained ventricular tachycardia)     a. LifeVest applied 04/2014 admission->refused ICD placement.  [name] HLD (hyperlipidemia)     Past Surgical History  Procedure Laterality Date  . Cardiac catheterization      ejection fraction 50%    ZOX:[id number]:UJ colds or fevers, no weight changes Skin:no rashes or ulcers HEENT:no blurred vision, no congestion CV:see HPI PUL:see HPI GI:no diarrhea constipation or melena, no indigestion GU:no hematuria, no dysuria MS:no joint pain, no claudication Neuro:no syncope, no lightheadedness Endo:no diabetes, no thyroid disease  Wt Readings from Last 3 Encounters:  06/14/14 144 lb 4.8 oz (65.454 kg)  06/01/14 145 lb 6.4 oz (65.953 kg)  05/25/14 144 lb (65.318 kg)    PHYSICAL EXAM BP 150/78  Pulse 71  Ht 5\' 8"  (1.727 m)  Wt 144 lb 4.8 oz (65.454 kg)  BMI 21.95 kg/m2 General:Pleasant affect, NAD Skin:Warm and dry, brisk capillary refill HEENT:normocephalic, sclera clear, mucus membranes moist Neck:supple, + JVD, no bruits  Heart:S1S2 RRR without murmur, gallup, rub or click Lungs:with bibasilar crackles, no rhonchi or wheezes WJX:BJYN, non tender, + BS, do not palpate liver spleen or masses Ext:no lower ext edema, 2+ pedal pulses, 2+ radial pulses Neuro:alert and oriented X 3, MAE, follows commands, + facial symmetry  EKG:SR with LBBB, PVC, no acute changes otherwise.  ASSESSMENT AND PLAN Sinus bradycardia With syncope on BB.  No BB unless pt has PPM.   Currently awaiting need eval for ICD.  Pt not sure he wishes to have ICD/PPM we discussed.  I am not sure he understands exactly how it works.  He also thought it would take a long time to recover, reassured.  He is to see Dr. Johney Frame this month.  Continue life vest for now.  Chronic systolic heart failure Echo (8/15):  Severe LVH, EF 20-25%, severe global HK, Gr 1 DD, mild reduced RVF, PASP 38 mmHg, small eff Plan to repeat Echo in Dec.  Keep appt with Dr. P. Swaziland in Nov. Euvolemic today.  We'll check pro BNP and electrolytes decrease Lasix to once daily.  He'll contact us if he significance with increased shortness of breath or edema.  HTN (hypertension) Stable continue to monitor  Tobacco abuse Has again stopped smoking  Angina decubitus Continues with angina using nitroglycerin with relief.  I increased his isosorbide to 90 mg daily. He'll followup with Dr. Swaziland and Dr. Johney Frame.  CAD (coronary artery disease) Recent cath with patent stent in the RCA EF 20-25%. 1. Coronary artery disease with nonobstructive LAD stenosis, severe diagonal stenosis, total occlusion of the first OM branch, and patency of the RCA stented segment  2. Severe segmental LV systolic dysfunction  3. Normal LVEDP after administration of IV nitroglycerin and IV beta blocker

## 2014-06-14 NOTE — Patient Instructions (Signed)
Your physician has recommended you make the following change in your medication: ONLY TAKE THE FUROSEMIDE 40 MG ONCE A DAY. Increase the isosorbide to 1 & 1/2 tablet in the morning.   Your physician recommends that you return for lab work downstairs today.  If after these medication changes and you still become short of breath please call the office to notify, other wise keep planned appointment s already scheduled with Drs Johney Frame and Swaziland.

## 2014-06-15 ENCOUNTER — Encounter: Payer: Self-pay | Admitting: Cardiology

## 2014-06-15 DIAGNOSIS — I208 Other forms of angina pectoris: Secondary | ICD-10-CM | POA: Insufficient documentation

## 2014-06-15 DIAGNOSIS — I2089 Other forms of angina pectoris: Secondary | ICD-10-CM

## 2014-06-15 HISTORY — DX: Other forms of angina pectoris: I20.8

## 2014-06-15 HISTORY — DX: Other forms of angina pectoris: I20.89

## 2014-06-15 LAB — PRO B NATRIURETIC PEPTIDE: Pro B Natriuretic peptide (BNP): 356.5 pg/mL — ABNORMAL HIGH (ref <126)

## 2014-06-15 NOTE — Assessment & Plan Note (Signed)
Has again stopped smoking

## 2014-06-15 NOTE — Assessment & Plan Note (Signed)
With syncope on BB.  No BB unless pt has PPM.  Currently awaiting need eval for ICD.  Pt not sure he wishes to have ICD/PPM we discussed.  I am not sure he understands exactly how it works.  He also thought it would take a long time to recover, reassured.  He is to see Dr. Johney Frame this month.  Continue life vest for now.

## 2014-06-15 NOTE — Assessment & Plan Note (Signed)
>>  ASSESSMENT AND PLAN FOR CORONARY ARTERY DISEASE INVOLVING NATIVE CORONARY ARTERY OF NATIVE HEART WITHOUT ANGINA PECTORIS WRITTEN ON 06/12/2023  7:15 AM BY Lyzette Reinhardt, DO  >>ASSESSMENT AND PLAN FOR CAD (CORONARY ARTERY DISEASE) WRITTEN ON 06/15/2014 10:02 AM BY INGOLD, LAURA R, NP  Recent cath with patent stent in the RCA EF 20-25%. 1. Coronary artery disease with nonobstructive LAD stenosis, severe diagonal stenosis, total occlusion of the first OM branch, and patency of the RCA stented segment  2. Severe segmental LV systolic dysfunction  3. Normal LVEDP after administration of IV nitroglycerin and IV beta blocker

## 2014-06-15 NOTE — Assessment & Plan Note (Signed)
>>  ASSESSMENT AND PLAN FOR CAD (CORONARY ARTERY DISEASE) WRITTEN ON 06/15/2014 10:02 AM BY INGOLD, LAURA R, NP  Recent cath with patent stent in the RCA EF 20-25%. 1. Coronary artery disease with nonobstructive LAD stenosis, severe diagonal stenosis, total occlusion of the first OM branch, and patency of the RCA stented segment  2. Severe segmental LV systolic dysfunction  3. Normal LVEDP after administration of IV nitroglycerin and IV beta blocker

## 2014-06-15 NOTE — Assessment & Plan Note (Signed)
Stable--continue to monitor

## 2014-06-15 NOTE — Assessment & Plan Note (Signed)
Recent cath with patent stent in the RCA EF 20-25%. 1. Coronary artery disease with nonobstructive LAD stenosis, severe diagonal stenosis, total occlusion of the first OM branch, and patency of the RCA stented segment  2. Severe segmental LV systolic dysfunction  3. Normal LVEDP after administration of IV nitroglycerin and IV beta blocker

## 2014-06-15 NOTE — Assessment & Plan Note (Signed)
Echo (8/15):  Severe LVH, EF 20-25%, severe global HK, Gr 1 DD, mild reduced RVF, PASP 38 mmHg, small eff Plan to repeat Echo in Dec.  Keep appt with Dr. P. Swaziland in Nov. Euvolemic today.  We'll check pro BNP and electrolytes decrease Lasix to once daily.  He'll contact us if he significance with increased shortness of breath or edema.

## 2014-06-15 NOTE — Assessment & Plan Note (Signed)
Continues with angina using nitroglycerin with relief.  I increased his isosorbide to 90 mg daily. He'll followup with Dr. Swaziland and Dr. Johney Frame.

## 2014-06-20 ENCOUNTER — Encounter (HOSPITAL_COMMUNITY): Payer: Self-pay | Admitting: Emergency Medicine

## 2014-06-20 DIAGNOSIS — Z9861 Coronary angioplasty status: Secondary | ICD-10-CM | POA: Diagnosis not present

## 2014-06-20 DIAGNOSIS — J449 Chronic obstructive pulmonary disease, unspecified: Secondary | ICD-10-CM | POA: Diagnosis not present

## 2014-06-20 DIAGNOSIS — Z7902 Long term (current) use of antithrombotics/antiplatelets: Secondary | ICD-10-CM | POA: Insufficient documentation

## 2014-06-20 DIAGNOSIS — Z7982 Long term (current) use of aspirin: Secondary | ICD-10-CM | POA: Diagnosis not present

## 2014-06-20 DIAGNOSIS — I2510 Atherosclerotic heart disease of native coronary artery without angina pectoris: Secondary | ICD-10-CM | POA: Insufficient documentation

## 2014-06-20 DIAGNOSIS — R42 Dizziness and giddiness: Secondary | ICD-10-CM | POA: Insufficient documentation

## 2014-06-20 DIAGNOSIS — Z9119 Patient's noncompliance with other medical treatment and regimen: Secondary | ICD-10-CM | POA: Diagnosis not present

## 2014-06-20 DIAGNOSIS — G8929 Other chronic pain: Secondary | ICD-10-CM | POA: Insufficient documentation

## 2014-06-20 DIAGNOSIS — I5042 Chronic combined systolic (congestive) and diastolic (congestive) heart failure: Secondary | ICD-10-CM | POA: Diagnosis not present

## 2014-06-20 DIAGNOSIS — R001 Bradycardia, unspecified: Secondary | ICD-10-CM | POA: Diagnosis not present

## 2014-06-20 DIAGNOSIS — E785 Hyperlipidemia, unspecified: Secondary | ICD-10-CM | POA: Diagnosis not present

## 2014-06-20 DIAGNOSIS — Z79899 Other long term (current) drug therapy: Secondary | ICD-10-CM | POA: Diagnosis not present

## 2014-06-20 DIAGNOSIS — Z9889 Other specified postprocedural states: Secondary | ICD-10-CM | POA: Diagnosis not present

## 2014-06-20 DIAGNOSIS — Z7951 Long term (current) use of inhaled steroids: Secondary | ICD-10-CM | POA: Insufficient documentation

## 2014-06-20 DIAGNOSIS — I10 Essential (primary) hypertension: Secondary | ICD-10-CM | POA: Insufficient documentation

## 2014-06-20 DIAGNOSIS — Z87891 Personal history of nicotine dependence: Secondary | ICD-10-CM | POA: Diagnosis not present

## 2014-06-20 NOTE — ED Notes (Signed)
Patient presents stating he was working out in the yard yesterday  Today he started feeling funny  Stated he felt a hard area to the left upper arm and thinks it may be a blood clot

## 2014-06-21 ENCOUNTER — Ambulatory Visit (INDEPENDENT_AMBULATORY_CARE_PROVIDER_SITE_OTHER): Payer: Medicare Other | Admitting: Internal Medicine

## 2014-06-21 ENCOUNTER — Emergency Department (HOSPITAL_COMMUNITY): Payer: Medicare Other

## 2014-06-21 ENCOUNTER — Encounter: Payer: Self-pay | Admitting: Internal Medicine

## 2014-06-21 ENCOUNTER — Emergency Department (HOSPITAL_COMMUNITY)
Admission: EM | Admit: 2014-06-21 | Discharge: 2014-06-21 | Disposition: A | Discharge status: Home / Self Care | Payer: Medicare Other | Attending: Emergency Medicine | Admitting: Emergency Medicine

## 2014-06-21 VITALS — BP 132/74 | HR 60 | Ht 68.0 in | Wt 143.0 lb

## 2014-06-21 DIAGNOSIS — I10 Essential (primary) hypertension: Secondary | ICD-10-CM

## 2014-06-21 DIAGNOSIS — I2510 Atherosclerotic heart disease of native coronary artery without angina pectoris: Secondary | ICD-10-CM

## 2014-06-21 DIAGNOSIS — I472 Ventricular tachycardia: Secondary | ICD-10-CM

## 2014-06-21 DIAGNOSIS — I4729 Other ventricular tachycardia: Secondary | ICD-10-CM

## 2014-06-21 DIAGNOSIS — I5022 Chronic systolic (congestive) heart failure: Secondary | ICD-10-CM

## 2014-06-21 DIAGNOSIS — R42 Dizziness and giddiness: Secondary | ICD-10-CM

## 2014-06-21 DIAGNOSIS — I429 Cardiomyopathy, unspecified: Secondary | ICD-10-CM

## 2014-06-21 DIAGNOSIS — I447 Left bundle-branch block, unspecified: Secondary | ICD-10-CM

## 2014-06-21 DIAGNOSIS — I208 Other forms of angina pectoris: Secondary | ICD-10-CM

## 2014-06-21 DIAGNOSIS — R55 Syncope and collapse: Secondary | ICD-10-CM

## 2014-06-21 DIAGNOSIS — E785 Hyperlipidemia, unspecified: Secondary | ICD-10-CM

## 2014-06-21 DIAGNOSIS — R001 Bradycardia, unspecified: Secondary | ICD-10-CM

## 2014-06-21 LAB — TROPONIN I

## 2014-06-21 LAB — CBC WITH DIFFERENTIAL/PLATELET
BASOS ABS: 0 10*3/uL (ref 0.0–0.1)
Basophils Relative: 0 % (ref 0–1)
EOS ABS: 0 10*3/uL (ref 0.0–0.7)
Eosinophils Relative: 1 % (ref 0–5)
HEMATOCRIT: 36.9 % — AB (ref 39.0–52.0)
HEMOGLOBIN: 12 g/dL — AB (ref 13.0–17.0)
Lymphocytes Relative: 59 % — ABNORMAL HIGH (ref 12–46)
Lymphs Abs: 3.1 10*3/uL (ref 0.7–4.0)
MCH: 27.8 pg (ref 26.0–34.0)
MCHC: 32.5 g/dL (ref 30.0–36.0)
MCV: 85.6 fL (ref 78.0–100.0)
MONO ABS: 0.3 10*3/uL (ref 0.1–1.0)
MONOS PCT: 6 % (ref 3–12)
NEUTROS ABS: 1.8 10*3/uL (ref 1.7–7.7)
Neutrophils Relative %: 34 % — ABNORMAL LOW (ref 43–77)
Platelets: 161 10*3/uL (ref 150–400)
RBC: 4.31 MIL/uL (ref 4.22–5.81)
RDW: 13.2 % (ref 11.5–15.5)
WBC: 5.3 10*3/uL (ref 4.0–10.5)

## 2014-06-21 LAB — COMPREHENSIVE METABOLIC PANEL
ALBUMIN: 4.1 g/dL (ref 3.5–5.2)
ALT: 10 U/L (ref 0–53)
ANION GAP: 14 (ref 5–15)
AST: 20 U/L (ref 0–37)
Alkaline Phosphatase: 74 U/L (ref 39–117)
BUN: 14 mg/dL (ref 6–23)
CALCIUM: 9.6 mg/dL (ref 8.4–10.5)
CO2: 25 mEq/L (ref 19–32)
CREATININE: 1.13 mg/dL (ref 0.50–1.35)
Chloride: 101 mEq/L (ref 96–112)
GFR calc Af Amer: 74 mL/min — ABNORMAL LOW (ref >90)
GFR calc non Af Amer: 64 mL/min — ABNORMAL LOW (ref >90)
Glucose, Bld: 88 mg/dL (ref 70–99)
Potassium: 4.1 mEq/L (ref 3.7–5.3)
Sodium: 140 mEq/L (ref 137–147)
TOTAL PROTEIN: 7.9 g/dL (ref 6.0–8.3)
Total Bilirubin: 0.4 mg/dL (ref 0.3–1.2)

## 2014-06-21 LAB — PROTIME-INR
INR: 1.09 (ref 0.00–1.49)
Prothrombin Time: 14.1 seconds (ref 11.6–15.2)

## 2014-06-21 LAB — I-STAT TROPONIN, ED: TROPONIN I, POC: 0.04 ng/mL (ref 0.00–0.08)

## 2014-06-21 MED ORDER — POTASSIUM CHLORIDE CRYS ER 10 MEQ PO TBCR: 10.0000 meq | EXTENDED_RELEASE_TABLET | Freq: Every day | ORAL | Status: DC

## 2014-06-21 MED ORDER — SODIUM CHLORIDE 0.9 % IV BOLUS (SEPSIS)
1000.0000 mL | Freq: Once | INTRAVENOUS | Status: AC
  Administered 2014-06-21: 1000 mL via INTRAVENOUS

## 2014-06-21 MED ORDER — FUROSEMIDE 20 MG PO TABS: 20.0000 mg | ORAL_TABLET | Freq: Every day | ORAL | Status: DC

## 2014-06-21 MED ORDER — ACETAMINOPHEN 500 MG PO TABS
1000.0000 mg | ORAL_TABLET | Freq: Once | ORAL | Status: AC
  Administered 2014-06-21: 1000 mg via ORAL
  Filled 2014-06-21: qty 2

## 2014-06-21 NOTE — H&P (Signed)
Patient ID: [name] MRN: [id number], DOB/AGE: [id number]   Admit date: 06/21/2014   Primary Physician: Rene Paci, MD Primary Cardiologist: P. Swaziland, MD / Shela Commons. Allred, MD   Pt. Profile:  89M ICM (EF 20-25%), VT, LBBB, history of CRT-D refusal, bradycardia now off beta blockers who presents with an episode of ortho stasis.   Problem List  Past Medical History  Diagnosis Date  . Dyslipidemia     a. on statin  . LBBB (left bundle branch block)   . CAD (coronary artery disease)     a. s/p stent RCA DES 9/09; b. 2014 Attempted PCI of OM1 @ [facility];  c. 04/2014 Cath: LAD 40-50p, D1 95-99 (chronic), LCX 30-40 inf branch, OM1 CTO, RCA 30-40p, RCA patent stent, EF 35%->Med Rx.  . HTN (hypertension)     a. Reports intolerance to hydralazine; b. no beta blockers 2/2 bradycardia;  c. failed on ACE and ARB.  [name] Noncompliance   . Chronic combined systolic and diastolic CHF (congestive heart failure)     a. EF about 40 to 45% per echo in April 2013;  b. 04/2014 Echo: EF 20-25%, sev LVH, sev glob HK, Gr 1 DD, mildly reduced RV fxn, PASP .  [name] LVH (left ventricular hypertrophy)   . COPD (chronic obstructive pulmonary disease) dx 06/2013    PFTs 07/08/13: mod obst with resp to bronchodilator, moderate decrease diffusion, airtrapping  . Chronic low back pain   . Mixed Ischemic/Non-ischemic Cardiomyopathy     a. 04/2014 Echo: EF 20-25%, sev glob HK.  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 NSVT (nonsustained ventricular tachycardia)     a. LifeVest applied 04/2014 admission->refused ICD placement.  [name] HLD (hyperlipidemia)     Past Surgical History  Procedure Laterality Date  . Cardiac catheterization      ejection fraction 50%     Allergies  Allergies  Allergen Reactions  . Aspirin Other (See Comments)    GI upset at high doses only.  . Influenza Vaccines Other (See Comments)    Pt declines  . Pneumococcal Vaccines Other (See Comments)    Pt declines     HPI 89M ICM (EF 20-25%), VT, LBBB, history of CRT-D refusal, bradycardia now off beta blockers who presents with an episode of ortho stasis.  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 Once he would sit down, symptoms would resolve. He reported that this happened multiple times over about 2 hours. States it really only happened if he had been sitting down for a while. He has not had this happen before today. No syncope or presyncope. No chest pain. No changes in bowel, bladder, or eating habits.   States he went home and checked his BP and found it to be 179/131 with a HR of 45. In this setting he came to the ED. In the ED on tele he was in NSR with LBBB and occasional PVCs. No bradycardia or tachycardia.  He was given almost a litre of fluid when I saw him. He felt back to normal.   Of note, he has a history of bradycardia, VT, LBBB, and a CRT-D has been recommended. He has refused and is currently wearing a LifeVest which has never gone off. He has an appointment scheduled to see Dr. Johney Frame on 10/12 at 10:30am to discuss CRT-D implant. When I asked if he was interested in pacemaker defibrillator, he  still stated he was undecided.   Home Medications  Prior to Admission medications   Medication Sig Start Date End Date Taking? Authorizing Provider  albuterol (PROVENTIL HFA;VENTOLIN HFA) 108 (90 BASE) MCG/ACT inhaler Inhale 1 puff into the lungs every 6 (six) hours as needed for wheezing or shortness of breath. 12/24/13   Peter M Swaziland, MD  aspirin EC 81 MG tablet Take 1 tablet (81 mg total) by mouth daily. 08/24/13   Newt Lukes, MD  atorvastatin (LIPITOR) 10 MG tablet Take 1 tablet (10 mg total) by mouth daily. 06/18/13 06/18/14  Peter M Swaziland, MD  clopidogrel (PLAVIX) 75 MG tablet Take 1 tablet (75 mg total) by mouth daily. 06/14/14   Leone Brand, NP  Fluticasone-Salmeterol (ADVAIR) 250-50 MCG/DOSE AEPB  Inhale 1 puff into the lungs 2 (two) times daily.    Historical Provider, MD  furosemide (LASIX) 40 MG tablet Take 1 tablet (40 mg total) by mouth daily. 06/01/14   Dwana Melena, PA-C  isosorbide mononitrate (IMDUR) 60 MG 24 hr tablet Take 60 mg by mouth daily. 06/01/14   Dwana Melena, PA-C  isosorbide mononitrate (IMDUR) 60 MG 24 hr tablet Take 1 & 1/2 tablet qam 06/14/14   Leone Brand, NP  losartan (COZAAR) 100 MG tablet Take 1 tablet (100 mg total) by mouth daily. 06/01/14   Dwana Melena, PA-C  nitroGLYCERIN (NITROSTAT) 0.4 MG SL tablet Place 1 tablet (0.4 mg total) under the tongue every 5 (five) minutes as needed for chest pain. 09/05/13 12/18/14  Elease Etienne, MD  potassium chloride SA (K-DUR,KLOR-CON) 20 MEQ tablet Take 1 tablet (20 mEq total) by mouth daily. 06/01/14   Dwana Melena, PA-C  tiotropium (SPIRIVA HANDIHALER) 18 MCG inhalation capsule Place 1 capsule (18 mcg total) into inhaler and inhale daily. 07/31/13   Newt Lukes, MD    Family History  Family History  Problem Relation Age of Onset  . Heart attack Neg Hx   . Stroke Neg Hx     Social History  History   Social History  . Marital Status: Married    Spouse Name: N/A    Number of Children: 3  . Years of Education: N/A   Occupational History  . security guard.    Social History Main Topics  . Smoking status: Former Smoker -- 0.50 packs/day for 56 years    Types: Cigarettes    Quit date: 09/10/2012  . Smokeless tobacco: Not on file  . Alcohol Use: No  . Drug Use: No  . Sexual Activity: Not Currently   Other Topics Concern  . Not on file   Social History Narrative            Review of Systems General:  No chills, fever, night sweats or weight changes.  Cardiovascular:  No chest pain, dyspnea on exertion, edema, orthopnea, palpitations, paroxysmal nocturnal dyspnea. Dermatological: No rash, lesions/masses Respiratory: No cough, dyspnea Urologic: No hematuria, dysuria Abdominal:   No  nausea, vomiting, diarrhea, bright red blood per rectum, melena, or hematemesis Neurologic:  No visual changes, wkns, changes in mental status. All other systems reviewed and are otherwise negative except as noted above.  Physical Exam  Blood pressure 126/66, pulse 71, temperature 98.3 F (36.8 C), temperature source Oral, resp. rate 12, height 5\' 8"  (1.727 m), weight 65.318 kg (144 lb), SpO2 100.00%.   Orthostatics: lying 142/81, HR 69. Standing 142/81, HR 92 initially, then down to 74. Asymptomatic the whole time.  General:  Pleasant, NAD Psych: Normal affect. Neuro: Alert and oriented X 3. Moves all extremities spontaneously. HEENT: Normal  Neck: Supple without bruits or JVD. Lungs:  Resp regular and unlabored, CTA. Heart: RRR no s3, s4, or murmurs. Abdomen: Soft, non-tender, non-distended, BS + x 4.  Extremities: No clubbing, cyanosis or edema. DP/PT/Radials 2+ and equal bilaterally.  Labs  Troponin [facility](Point of Care Test)  Recent Labs  06/21/14 0004  TROPIPOC 0.04   No results found for this basename: CKTOTAL, CKMB, TROPONINI,  in the last 72 hours Lab Results  Component Value Date   WBC 5.3 06/20/2014   HGB 12.0* 06/20/2014   HCT 36.9* 06/20/2014   MCV 85.6 06/20/2014   PLT 161 06/20/2014    Recent Labs Lab 06/14/14 1037  NA 140  K 3.8  CL 101  CO2 28  BUN 17  CREATININE 1.07  CALCIUM 9.4  GLUCOSE 93   Lab Results  Component Value Date   CHOL 208* 06/18/2013   HDL 54.20 06/18/2013   LDLCALC 68 11/12/2011   TRIG 71 05/08/2014   Lab Results  Component Value Date   DDIMER  Value: 0.65        AT THE INHOUSE ESTABLISHED CUTOFF VALUE OF 0.48 ug/mL FEU, THIS ASSAY HAS BEEN DOCUMENTED IN THE LITERATURE TO HAVE* 05/23/2008     Radiology/Studies  Dg Chest 2 View  05/30/2014   CLINICAL DATA:  Multiple syncopal episodes today.  EXAM: CHEST  2 VIEW  COMPARISON:  09/03/2013.  FINDINGS: The cardiac silhouette remains borderline enlarged. The lungs remain clear and  hyperexpanded with diffuse peribronchial thickening. Minimal thoracic spine degenerative changes. Diffuse osteopenia.  IMPRESSION: 1. No acute abnormality. 2. Stable borderline cardiomegaly. 3. Stable changes of COPD and chronic bronchitis.   Electronically Signed   By: Gordan PaymentSteve  Reid M.D.   On: 05/30/2014 13:24   TTE 05/08/14 - Left ventricle: The cavity size was normal. Wall thickness was increased in a pattern of severe LVH. Systolic function was severely reduced. The estimated ejection fraction was in the range of 20% to 25%. Severe global hypokinesis. There was an increased relative contribution of atrial contraction to ventricular filling. Doppler parameters are consistent with abnormal left ventricular relaxation (grade 1 diastolic dysfunction). Doppler parameters are consistent with high ventricular filling pressure. - Ventricular septum: Septal motion showed abnormal function and dyssynergy. These changes are consistent with a left bundle branch block. - Aortic valve: Mildly calcified annulus. Trileaflet; mildly thickened leaflets. - Mitral valve: Moderately thickened leaflets . - Right ventricle: Systolic function was mildly reduced. - Pulmonary arteries: PA peak pressure: 38 mm Hg (S). - Pericardium, extracardiac: A small pericardial effusion was identified. No evidence of hemodynamic sequelae.  Cardiac cath (05/08/2014)  Procedural Findings:  Hemodynamics:  AO 108/61  LV 106/14  Coronary angiography:  Coronary dominance: right  Left mainstem: The left main is widely patent with no obstruction.  Left anterior descending (LAD): The LAD is a large caliber vessel wrapping around the left ventricular apex. The proximal LAD has a long area of mild stenosis estimated at approximately 40-50% at most. The first diagonal has a subtotal stenosis of 95-99%. It originates at acute angle from the LAD and is small in caliber.  Left circumflex (LCx): The left circumflex supplies a large  intermediate branch with a 30-40% stenosis. The AV circumflex extends down and is patent. However, the first obtuse marginal is chronically occluded with left to left collaterals.  Right coronary artery (RCA): Dominant vessel. The proximal vessel has  30-40% stenosis. The mid vessel is stented and the stented segment is widely patent. The distal RCA is tortuous. The PDA and PLA branches are patent.  Left ventriculography: The basal and midinferior wall is akinetic. The other LV wall segments are diffusely hypokinetic. The estimated LVEF is 35%. There is mild mitral regurgitation.  Contrast: 75 cc Omnipaque  Radiation dose/Fluoro time: 3.6 minutes  Estimated Blood Loss: Minimal  Final Conclusions:  1. Coronary artery disease with nonobstructive LAD stenosis, severe diagonal stenosis, total occlusion of the first OM branch, and patency of the RCA stented segment  2. Severe segmental LV systolic dysfunction  3. Normal LVEDP after administration of IV nitroglycerin and IV beta blocker  4. Suspected respiratory failure from acute pulmonary edema/hypertensive emergency  Recommendations: The patient's coronary anatomy is unchanged from previous studies. He will be treated medically. His respiratory status is tenuous at present and despite mechanical ventilation on 100% FiO2, he remains severely hypoxemic. CCM is present and managing the patient's respiratory status. Appreciate their assistance in his care.   ECG 06/20/14: NSR LBBB (QRSd ), PVC. Unchanged compared to 06/14/14.   ASSESSMENT AND PLAN  63M ICM (EF 20-25%), VT, LBBB, history of CRT-D refusal, bradycardia now off beta blockers who presents with an episode of ortho stasis. It is possible, but impossible to prove, that sustained bradycardia or some other rhythm disturbance (e.g. Bigeminy) could have predisposed towards the orthostatic episodes.  Tele in the did not demonstrate anything suspicious and the LifeVest did not have any alarms. Cr  is up slightly (1.1, up from baseline of 0.9) potentially consistent with hypovolemia. He feels well after having received IV fluids. He has an appointment with Dr. Johney Frame in a few hours to discuss CRT-D implantation.   1. OK to discharge from the ED 2. Please decrease lasix from 40mg  to 20mg  3. CRT-D discussion with Dr. Johney Frame today in clinic   Signed, Glori Luis, MD  06/21/2014, 12:45 AM

## 2014-06-21 NOTE — ED Notes (Signed)
Patient transported back from radiology and is back in his room

## 2014-06-21 NOTE — ED Provider Notes (Signed)
CSN: [id number]     Arrival date & time 06/20/14  2322 History   First MD Initiated Contact with Patient 06/21/14 0013     Chief Complaint  Patient presents with  . Dizziness    when standing up     (Consider location/radiation/quality/duration/timing/severity/associated sxs/prior Treatment) HPI ZAION HREHA is a 73 y.o. male with past medical history of coronary artery disease status post stent to RCA, nonsustained V. tach status post life vest coming in with dizziness. Patient states it has occurred for the past couple of days. His dizziness is presyncope and that he feels lightheaded. He denies any true syncope. It is worse when he stands or walks. Patient was recently admitted to the cardiology service, and refused an ICD and was discharged with a life vest external defibrillator. Patient states he's been compliant with all his medications and tells me he thinks he needs a defibrillator now. At home he took his heart rate and blood pressure, they were in the 40s with a systolic of 180s. He states when he sits down his symptoms go away. He denies any chest pain or shortness of breath. He's had no fever or recent infections. Patient has no further complaints.  10 Systems reviewed and are negative for acute change except as noted in the HPI.     Past Medical History  Diagnosis Date  . Dyslipidemia     a. on statin  . LBBB (left bundle branch block)   . CAD (coronary artery disease)     a. s/p stent RCA DES 9/09; b. 2014 Attempted PCI of OM1 @ [place];  c. 04/2014 Cath: LAD 40-50p, D1 95-99 (chronic), LCX 30-40 inf branch, OM1 CTO, RCA 30-40p, RCA patent stent, EF 35%->Med Rx.  . HTN (hypertension)     a. Reports intolerance to hydralazine; b. no beta blockers 2/2 bradycardia;  c. failed on ACE and ARB.  [name] Noncompliance   . Chronic combined systolic and diastolic CHF (congestive heart failure)     a. EF about 40 to 45% per echo in April 2013;  b. 04/2014 Echo: EF 20-25%, sev LVH, sev  glob HK, Gr 1 DD, mildly reduced RV fxn, PASP .  [name] LVH (left ventricular hypertrophy)   . COPD (chronic obstructive pulmonary disease) dx 06/2013    PFTs 07/08/13: mod obst with resp to bronchodilator, moderate decrease diffusion, airtrapping  . Chronic low back pain   . Mixed Ischemic/Non-ischemic Cardiomyopathy     a. 04/2014 Echo: EF 20-25%, sev glob HK.  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 NSVT (nonsustained ventricular tachycardia)     a. LifeVest applied 04/2014 admission->refused ICD placement.  [name] HLD (hyperlipidemia)    Past Surgical History  Procedure Laterality Date  . Cardiac catheterization      ejection fraction 50%   Family History  Problem Relation Age of Onset  . Heart attack Neg Hx   . Stroke Neg Hx    History  Substance Use Topics  . Smoking status: Former Smoker -- 0.50 packs/day for 56 years    Types: Cigarettes    Quit date: 09/10/2012  . Smokeless tobacco: Not on file  . Alcohol Use: No    Review of Systems    Allergies  Aspirin; Influenza vaccines; and Pneumococcal vaccines  Home Medications   Prior to Admission medications   Medication Sig Start Date End Date Taking? Authorizing Provider  albuterol (PROVENTIL HFA;VENTOLIN HFA) 108 (90 BASE) MCG/ACT inhaler Inhale 1 puff into the lungs every  6 (six) hours as needed for wheezing or shortness of breath. 12/24/13   Peter M Swaziland, MD  aspirin EC 81 MG tablet Take 1 tablet (81 mg total) by mouth daily. 08/24/13   Newt Lukes, MD  atorvastatin (LIPITOR) 10 MG tablet Take 1 tablet (10 mg total) by mouth daily. 06/18/13 06/18/14  Peter M Swaziland, MD  clopidogrel (PLAVIX) 75 MG tablet Take 1 tablet (75 mg total) by mouth daily. 06/14/14   Leone Brand, NP  Fluticasone-Salmeterol (ADVAIR) 250-50 MCG/DOSE AEPB Inhale 1 puff into the lungs 2 (two) times daily.    Historical Provider, MD  furosemide (LASIX) 40 MG tablet Take 1 tablet (40 mg total) by mouth daily. 06/01/14   Dwana Melena, PA-C   isosorbide mononitrate (IMDUR) 60 MG 24 hr tablet Take 60 mg by mouth daily. 06/01/14   Dwana Melena, PA-C  isosorbide mononitrate (IMDUR) 60 MG 24 hr tablet Take 1 & 1/2 tablet qam 06/14/14   Leone Brand, NP  losartan (COZAAR) 100 MG tablet Take 1 tablet (100 mg total) by mouth daily. 06/01/14   Dwana Melena, PA-C  nitroGLYCERIN (NITROSTAT) 0.4 MG SL tablet Place 1 tablet (0.4 mg total) under the tongue every 5 (five) minutes as needed for chest pain. 09/05/13 12/18/14  Elease Etienne, MD  potassium chloride SA (K-DUR,KLOR-CON) 20 MEQ tablet Take 1 tablet (20 mEq total) by mouth daily. 06/01/14   Dwana Melena, PA-C  tiotropium (SPIRIVA HANDIHALER) 18 MCG inhalation capsule Place 1 capsule (18 mcg total) into inhaler and inhale daily. 07/31/13   Newt Lukes, MD   BP 142/81  Pulse 53  Temp(Src) 98.3 F (36.8 C) (Oral)  Resp 20  Ht 5\' 8"  (1.727 m)  Wt 144 lb (65.318 kg)  BMI 21.90 kg/m2  SpO2 100% Physical Exam  Nursing note and vitals reviewed. Constitutional: He is oriented to person, place, and time. Vital signs are normal. He appears well-developed and well-nourished.  Non-toxic appearance. He does not appear ill. No distress.  HENT:  Head: Normocephalic and atraumatic.  Nose: Nose normal.  Mouth/Throat: Oropharynx is clear and moist. No oropharyngeal exudate.  Eyes: Conjunctivae and EOM are normal. Pupils are equal, round, and reactive to light. No scleral icterus.  Neck: Normal range of motion. Neck supple. No tracheal deviation, no edema, no erythema and normal range of motion present. No mass and no thyromegaly present.  Cardiovascular: S1 normal, S2 normal, intact distal pulses and normal pulses.  Exam reveals no gallop and no friction rub.   No murmur heard. Pulses:      Radial pulses are 2+ on the right side, and 2+ on the left side.       Dorsalis pedis pulses are 2+ on the right side, and 2+ on the left side.  Distant and irregular. Intermittent bradycardia.   Pulmonary/Chest: Effort normal and breath sounds normal. No respiratory distress. He has no wheezes. He has no rhonchi. He has no rales.  Abdominal: Soft. Normal appearance and bowel sounds are normal. He exhibits no distension, no ascites and no mass. There is no hepatosplenomegaly. There is no tenderness. There is no rebound, no guarding and no CVA tenderness.  Musculoskeletal: Normal range of motion. He exhibits no edema and no tenderness.  Lymphadenopathy:    He has no cervical adenopathy.  Neurological: He is alert and oriented to person, place, and time. He has normal strength. No cranial nerve deficit or sensory deficit. He exhibits normal muscle tone.  GCS eye subscore is 4. GCS verbal subscore is 5. GCS motor subscore is 6.  Normal 5 out of 5 strength x4 studies. Normal sensation x4 strength. Normal cerebellar testing.  Skin: Skin is warm, dry and intact. No petechiae and no rash noted. He is not diaphoretic. No erythema. No pallor.  Psychiatric: He has a normal mood and affect. His behavior is normal. Judgment normal.    ED Course  Procedures (including critical care time) Labs Review Labs Reviewed  CBC WITH DIFFERENTIAL - Abnormal; Notable for the following:    Hemoglobin 12.0 (*)    HCT 36.9 (*)    Neutrophils Relative % 34 (*)    Lymphocytes Relative 59 (*)    All other components within normal limits  COMPREHENSIVE METABOLIC PANEL - Abnormal; Notable for the following:    GFR calc non Af Amer 64 (*)    GFR calc Af Amer 74 (*)    All other components within normal limits  TROPONIN I  PROTIME-INR  I-STAT TROPOININ, ED    Imaging Review Dg Chest 2 View  06/21/2014   CLINICAL DATA:  Dizziness when standing. History of heart attack (04/2014). Initial encounter.  EXAM: CHEST  2 VIEW  COMPARISON:  05/30/2014; 09/03/2013  FINDINGS: Grossly unchanged borderline enlarged cardiac silhouette and mediastinal contours with atherosclerotic plaque within a mildly tortuous thoracic  aorta. The lungs appear hyperexpanded with flattening the bilateral main diaphragms. Grossly unchanged left basilar heterogeneous opacities favored to represent atelectasis or scar. There is minimal pleural parenchymal thickening about the bilateral major and the right minor fissures. No pleural effusion or pneumothorax. No evidence of edema. No acute osseus abnormalities.  IMPRESSION: Lung hyperexpansion and left basilar atelectasis without acute cardiopulmonary disease.   Electronically Signed   By: Simonne ComeJohn  Watts M.D.   On: 06/21/2014 01:08     EKG Interpretation None    MUSE not working: NSR, LBBB, frequent PVCs, no scarbossa criteria  MDM   Final diagnoses:  None    She presents emergency department complaining of dizziness, I believe this is likely related to his poor cardiac function. He denies his life vest ever firing.  Patient refused ICD and prior admission[facility]  Troponin is negative x2. EKG does not show signs of ischemia. Cardiology evaluated the patient at the bedside and suggest to decrease his Lasix. He has an appointment with electrophysiology tomorrow at 10:30 AM. He'll discuss possible ICD placement at that time. Patient's symptoms have improved after liter of IV fluids. His vital signs remain within normal limits and he is denying any dizziness. Patient safe for discharge.    Tomasita CrumbleAdeleke Lizzeth Meder, MD 06/21/14 [phone]

## 2014-06-21 NOTE — ED Notes (Signed)
Patient transported to X-ray 

## 2014-06-21 NOTE — ED Notes (Signed)
MD at bedside. 

## 2014-06-21 NOTE — Discharge Instructions (Signed)
Dizziness [name], [name] were seen today for dizziness. Her symptoms improved with IV fluids. Cardiology evaluated you and you need to decrease your Lasix to 20 mg per day.  Go to your appointment tomorrow at 10:30 AM for continued treatment. If any of his symptoms worsen come back to the emergency department. Thank you.  Dizziness means you feel unsteady or lightheaded. You might feel like you are going to pass out (faint). HOME CARE   Drink enough fluids to keep your pee (urine) clear or pale yellow.  Take your medicines exactly as told by your doctor. If you take blood pressure medicine, always stand up slowly from the lying or sitting position. Hold on to something to steady yourself.  If you need to stand in one place for a long time, move your legs often. Tighten and relax your leg muscles.  Have someone stay with you until you feel okay.  Do not drive or use heavy machinery if you feel dizzy.  Do not drink alcohol. GET HELP RIGHT AWAY IF:   You feel dizzy or lightheaded and it gets worse.  You feel sick to your stomach (nauseous), or you throw up (vomit).  You have trouble talking or walking.  You feel weak or have trouble using your arms, hands, or legs.  You cannot think clearly or have trouble forming sentences.  You have chest pain, belly (abdominal) pain, sweating, or you are short of breath.  Your vision changes.  You are bleeding.  You have problems from your medicine that seem to be getting worse. MAKE SURE YOU:   Understand these instructions.  Will watch your condition.  Will get help right away if you are not doing well or get worse. Document Released: 08/16/2011 Document Revised: 11/19/2011 Document Reviewed: 08/16/2011 [facility] Patient Information 2015 [place], [place]. This information is not intended to replace advice given to you by your health care provider. Make sure you discuss any questions you have with your health care provider.

## 2014-06-21 NOTE — ED Notes (Signed)
Pt did not experience any dizziness while sitting up for me or standing up.  Pt rhythm changes when I lay him flat.  No distress on pt part but i was able to observe this x3

## 2014-06-21 NOTE — Patient Instructions (Addendum)
Your physician recommends that you schedule a follow-up appointment as needed  Cardioverter Defibrillator Implantation An implantable cardioverter defibrillator (ICD) is a small, lightweight, battery-powered device that is placed (implanted) under the skin in the chest or abdomen. Your caregiver may prescribe an ICD if:  You have had an irregular heart rhythm (arrhythmia) that originated in the lower chambers of the heart (ventricles).  Your heart has been damaged by a disease (such as coronary artery disease) or heart condition (such as a heart attack). An ICD consists of a battery that lasts several years, a small computer called a pulse generator, and wires called leads that go into the heart. It is used to detect and correct two dangerous arrhythmias: a rapid heart rhythm (tachycardia) and an arrhythmia in which the ventricles contract in an uncoordinated way (fibrillation). When an ICD detects tachycardia, it sends an electrical signal to the heart that restores the heartbeat to normal (cardioversion). This signal is usually painless. If cardioversion does not work or if the ICD detects fibrillation, it delivers a small electrical shock to the heart (defibrillation) to restart the heart. The shock may feel like a strong jolt in the chest.ICDs may be programmed to correct other problems. Sometimes, ICDs are programmed to act as another type of implantable device called a pacemaker. Pacemakers are used to treat a slow heartbeat (bradycardia). LET YOUR CAREGIVER KNOW ABOUT:  Any allergies you have.  All medicines you are taking, including vitamins, herbs, eyedrops, and over-the-counter medicines and creams.  Previous problems you or members of your family have had with the use of anesthetics.  Any blood disorders you have had.  Other health problems you have. RISKS AND COMPLICATIONS Generally, the procedure to implant an ICD is safe. However, as with any surgical procedure, complications can  occur. Possible complications associated with implanting an ICD include:  Swelling, bleeding, or bruising at the site where the ICD was implanted.  Infection at the site where the ICD was implanted.  A reaction to medicine used during the procedure.  Nerve, heart, or blood vessel damage.  Blood clots. BEFORE THE PROCEDURE  You may need to have blood tests, heart tests, or a chest X-ray done before the day of the procedure.  Ask your caregiver about changing or stopping your regular medicines.  Make plans to have someone drive you home. You may need to stay in the hospital overnight after the procedure.  Stop smoking at least 24 hours before the procedure.  Take a bath or shower the night before the procedure. You may need to scrub your chest or abdomen with a special type of soap.  Do not eat or drink before your procedure for as long as directed by your caregiver. Ask if it is okay to take any needed medicine with a small sip of water. PROCEDURE  The procedure to implant an ICD in your chest or abdomen is usually done at a hospital in a room that has a large X-ray machine called a fluoroscope. The machine will be above you during the procedure. It will help your caregiver see your heart during the procedure. Implanting an ICD usually takes 1-3 hours. Before the procedure:   Small monitors will be put on your body. They will be used to check your heart, blood pressure, and oxygen level.  A needle will be put into a vein in your hand or arm. This is called an intravenous (IV) access tube. Fluids and medicine will flow directly into your body through the  IV tube.  Your chest or abdomen will be cleaned with a germ-killing (antiseptic) solution. The area may be shaved.  You may be given medicine to help you relax (sedative).  You will be given a medicine called a local anesthetic. This medicine will make the surgical site numb while the ICD is implanted. You will be sleepy but awake  during the procedure. After you are numb the procedure will begin. The caregiver will:  Make a small cut (incision). This will make a pocket deep under your skin that will hold the pulse generator.  Guide the leads through a large blood vessel into your heart and attach them to the heart muscles. Depending on the ICD, the leads may go into one ventricle or they may go to both ventricles and into an upper chamber of the heart (atrium).  Test the ICD.  Close the incision with stitches, glue, or staples. AFTER THE PROCEDURE  You may feel pain. Some pain is normal. It may last a few days.  You may stay in a recovery area until the local anesthetic has worn off. Your blood pressure and pulse will be checked often. You will be taken to a room where your heart will be monitored.  A chest X-ray will be taken. This is done to check that the cardioverter defibrillator is in the right place.  You may stay in the hospital overnight.  A slight bump may be seen over the skin where the ICD was placed. Sometimes, it is possible to feel the ICD under the skin. This is normal.  In the months and years afterward, your caregiver will check the device, the leads, and the battery every few months. Eventually, when the battery is low, the ICD will be replaced. Document Released: 05/19/2002 Document Revised: 06/17/2013 Document Reviewed: 09/15/2012 [facility] Patient Information 2015 [employer] [place]. This information is not intended to replace advice given to you by your health care provider. Make sure you discuss any questions you have with your health care provider.   Okay to send Lifevest back--in box that was originally mailed to you or you can call the 1-800 # to let them know  STAY ON THE DECREASED DOSE OF LASIX 20 MG DAILY DECREASE POTASSIUM TO 10 MEQ DAILY  LAB WORK IN 1 WEEK ; BMET  KEEP YOUR APPT WITH DR. SwazilandJORDAN IN 07/2014  MONITOR WEIGHT DAILY AND CALL IF YOUR WT IS UP 3 LB'S X 1 DAY OR YOU ARE  MORE SHORT OF BREATH OR SEE MORE SWELLING

## 2014-06-21 NOTE — Progress Notes (Signed)
Cardiology Office Note   Date:  06/21/2014   ID:  [name], DOB [birth year], MRN [id number]  PCP:  Rene PaciValerie Leschber, MD  Cardiologist:  Dr. Peter SwazilandJordan   Electrophysiologist:  Dr. Hillis RangeJames Kenley Troop    History of Present Illness: Vincent DemarkGlenn A Brewer is a 73 y.o. 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    Admitted 04/2014 with VDRF in the setting of acute pulmonary edema/HTN emergency.  He was intubated by EMS in the field with reported VTach x 30 sec during intubation >> self converted.  Emergent LHC demonstrated stable anatomy.  Treated with antibiotics to cover for possible pneumonia.  Seen by EP.  CRT-D was recommended given low EF, LBBB, NSVT.  Patient declined and was DC'd on LifeVest.    I saw him 05/25/14.  I adjusted his Lasix for volume excess.  I also placed him on nitrates given ongoing chest pain.    Admitted 9/20-9/22 with syncope felt to be related bradycardia and dehydration from recent increase in diuretics.  He was noted to have AKI.  Creatinine improved prior to DC (1.89 >> 0.94).  He had NSVT on tele.  LifeVest was continued.    He was seen in FU by Nada BoozerLaura Ingold, NP 10/5 and Lasix was decreased further.  He then presented to the ED this AM with postural dizziness.  He was seen by one of the Cardiology fellows.  His symptoms improved with IVFs (1L).  Lasix was decreased further to 20 mg QD and he was asked to FU with Dr. Hillis RangeJames Deunta Beneke today as planned.    Patient is a feeling better.  He is having less chest pain.  He has not had any further dizziness since he left the ED this AM.  He has not had any medications yet today.  He is NYHA 2b.  He denies orthopnea, PND, edema.  He denies recent syncope or ICD shocks.  He is still not interested in pursuing AICD implantation. He would like to take  the LifeVest off.     Studies:  - LHC (8/15):  prox LAD 40-50%, D1 95-99%, Int Br of CFX 30-40%, OM1 with CTO (L>L collats), prox RCA 30-40%, mid RCA stent ok, EF 35%  - Echo (8/15):  Severe LVH, EF 20-25%, severe global HK, Gr 1 DD, mild reduced RVF, PASP 38 mmHg, small eff  - Nuclear (3/13):  Normal perfusion, EF 40%    Recent Labs/Images: 06/14/2014: Pro B Natriuretic peptide (BNP) 356.50*  06/20/2014: ALT 10; Creatinine 1.13; Hemoglobin 12.0*; Potassium 4.1   Wt Readings from Last 3 Encounters:  06/21/14 143 lb (64.864 kg)  06/20/14 144 lb (65.318 kg)  06/14/14 144 lb 4.8 oz (65.454 kg)     Past Medical History  Diagnosis Date  . Dyslipidemia     a. on statin  . LBBB (left bundle branch block)   . CAD (coronary artery disease)     a. s/p stent RCA DES 9/09; b. 2014 Attempted PCI of OM1 @ [facility];  c. 04/2014 Cath: LAD 40-50p, D1 95-99 (chronic), LCX 30-40 inf branch, OM1 CTO, RCA 30-40p, RCA patent stent, EF 35%->Med Rx.  . HTN (hypertension)     a. Reports intolerance to hydralazine; b. no beta blockers 2/2 bradycardia;  c. failed on  ACE and ARB.  [name] Noncompliance   . Chronic combined systolic and diastolic CHF (congestive heart failure)     a. EF about 40 to 45% per echo in April 2013;  b. 04/2014 Echo: EF 20-25%, sev LVH, sev glob HK, Gr 1 DD, mildly reduced RV fxn, PASP .  [name] LVH (left ventricular hypertrophy)   . COPD (chronic obstructive pulmonary disease) dx 06/2013    PFTs 07/08/13: mod obst with resp to bronchodilator, moderate decrease diffusion, airtrapping  . Chronic low back pain   . Mixed Ischemic/Non-ischemic Cardiomyopathy     a. 04/2014 Echo: EF 20-25%, sev glob HK.  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 NSVT (nonsustained ventricular tachycardia)     a. LifeVest applied 04/2014 admission->refused ICD placement.  [name] HLD (hyperlipidemia)     Current Outpatient Prescriptions  Medication Sig Dispense Refill  . albuterol (PROVENTIL HFA;VENTOLIN  HFA) 108 (90 BASE) MCG/ACT inhaler Inhale 1 puff into the lungs every 6 (six) hours as needed for wheezing or shortness of breath.  1 Inhaler  6  . aspirin EC 81 MG tablet Take 1 tablet (81 mg total) by mouth daily.  150 tablet  2  . atorvastatin (LIPITOR) 10 MG tablet Take 1 tablet (10 mg total) by mouth daily.  90 tablet  3  . clopidogrel (PLAVIX) 75 MG tablet Take 1 tablet (75 mg total) by mouth daily.  90 tablet  1  . Fluticasone-Salmeterol (ADVAIR) 250-50 MCG/DOSE AEPB Inhale 1 puff into the lungs 2 (two) times daily.      . furosemide (LASIX) 20 MG tablet Take 1 tablet (20 mg total) by mouth daily.  10 tablet  0  . isosorbide mononitrate (IMDUR) 60 MG 24 hr tablet Take 60 mg by mouth daily.      [name] losartan (COZAAR) 100 MG tablet Take 1 tablet (100 mg total) by mouth daily.  30 tablet  5  . nitroGLYCERIN (NITROSTAT) 0.4 MG SL tablet Place 1 tablet (0.4 mg total) under the tongue every 5 (five) minutes as needed for chest pain.  30 tablet  0  . potassium chloride SA (K-DUR,KLOR-CON) 10 MEQ tablet Take 1 tablet (10 mEq total) by mouth daily.      [name] tiotropium (SPIRIVA HANDIHALER) 18 MCG inhalation capsule Place 1 capsule (18 mcg total) into inhaler and inhale daily.  30 capsule  11   No current facility-administered medications for this visit.     Allergies:   Aspirin; Influenza vaccines; and Pneumococcal vaccines   Social History:  The patient  reports that he quit smoking about 21 months ago. His smoking use included Cigarettes. He has a 28 pack-year smoking history. He does not have any smokeless tobacco history on file. He reports that he does not drink alcohol or use illicit drugs.   Family History:  The patient's family history is negative for Heart attack and Stroke.   ROS:  Please see the history of present illness.   He has had some sore throat.  All other systems reviewed and negative.   PHYSICAL EXAM: VS:  BP 132/74  Pulse 60  Ht 5\' 8"  (1.727 m)  Wt 143 lb (64.864 kg)  BMI  21.75 kg/m2 Well nourished, well developed, in no acute distress HEENT: normal Neck: no JVD Cardiac:  normal S1, S2; RRR; no murmur Lungs:  clear to auscultation bilaterally, no wheezing, rhonchi or rales Abd: soft, nontender, no hepatomegaly Ext: no edema Skin: warm and dry Neuro:  CNs 2-12 intact, no focal  abnormalities noted  EKG:   NSR, HR 60, LBBB    ASSESSMENT AND PLAN:  1. Chronic systolic heart failure:  Volume appears stable.  Continue current dose of Lasix at 20 QD.  Decrease K+ to 10 mEq QD.  Check BMET in 1 week.  2. Postural Dizziness:  He seems to be better after IVFs this AM  His Lasix was reduced as noted.  If this continues, consider decreasing the dose of his Losartan.   3. Coronary artery disease:  Chest pain seems to be improved on current dose of nitrates.  Continue ASA, plavix, statin. 4. Cardiomyopathy:  Probable mixed ischemic and non-ischemic.  He is unable to take a beta blocker due to low HR.  Continue nitrates, ARB.  If BP will allow, consider hydralazine, spironolactone over time.  With his recent symptoms of orthostasis, would hold off on this for now.  He is not interested in pursuing CRT-D implantation.  He would like to remove the LifeVest. He was also seen by Dr. Hillis Range today.  Risks and benefits of CRT-D implantation was discussed.  LifeVest was interrogated.  There were no arrhythmias noted.  Compliance was good in September and less than optimal in October.   5. Bradycardia:  Resolved off beta blocker.   6. NSVT (nonsustained ventricular tachycardia):  No episodes noted on interrogation of LifeVest.  7. HLD (hyperlipidemia):  Continue statin.  8. HTN (hypertension), malignant:  BP controlled.   Disposition:  FU with Dr. Peter Swaziland as planned.  He can see Dr. Hillis Range as needed.  He knows to call if he changes his mind on the CRT-D.      Signed, Brynda Rim, MHS 06/21/2014 10:57 PM    [facility] [address] [place], [place], [place]  [address] Phone: [phone]; Fax: [phone]     I have seen, examined the patient, and reviewed the above assessment and plan with Tereso Newcomer PAC.  Changes to above are made where necessary.  I had a long discussion with the patient today regarding risks, benefits, and alternatives to BiV ICD implant.  He remains certain that he is not interested in any surgical procedures at this time.  He is also clear that he would like to have his Lifevest removed.  He understands risks of sudden death. He has recently had postural dizziness related to overdiuresis.  Lifevest interrogation reveals no arrhythmias detected. He will continue to follow closely with Dr Swaziland for medical management of his cardiomyopathy.  I will see as needed going forward.  Co Sign: Hillis Range, MD 06/21/2014 10:57 PM

## 2014-06-28 ENCOUNTER — Other Ambulatory Visit (INDEPENDENT_AMBULATORY_CARE_PROVIDER_SITE_OTHER): Payer: Medicare Other | Admitting: *Deleted

## 2014-06-28 DIAGNOSIS — I10 Essential (primary) hypertension: Secondary | ICD-10-CM

## 2014-06-28 LAB — BASIC METABOLIC PANEL
BUN: 11 mg/dL (ref 6–23)
CALCIUM: 8.9 mg/dL (ref 8.4–10.5)
CO2: 27 mEq/L (ref 19–32)
CREATININE: 1 mg/dL (ref 0.4–1.5)
Chloride: 104 mEq/L (ref 96–112)
GFR: 92.64 mL/min (ref >60.00)
Glucose, Bld: 141 mg/dL — ABNORMAL HIGH (ref 70–99)
Potassium: 3.5 mEq/L (ref 3.5–5.1)
Sodium: 140 mEq/L (ref 135–145)

## 2014-06-29 ENCOUNTER — Encounter: Payer: Self-pay | Admitting: Internal Medicine

## 2014-06-29 ENCOUNTER — Telehealth: Payer: Self-pay | Admitting: *Deleted

## 2014-06-29 DIAGNOSIS — I10 Essential (primary) hypertension: Secondary | ICD-10-CM

## 2014-06-29 DIAGNOSIS — I5022 Chronic systolic (congestive) heart failure: Secondary | ICD-10-CM

## 2014-06-29 MED ORDER — POTASSIUM CHLORIDE CRYS ER 10 MEQ PO TBCR: 20.0000 meq | EXTENDED_RELEASE_TABLET | Freq: Every day | ORAL | Status: DC

## 2014-06-29 NOTE — Telephone Encounter (Signed)
pt notified about lab results and to increase K+ to 20 meq daily; bmet 11/6

## 2014-07-09 ENCOUNTER — Telehealth: Payer: Self-pay | Admitting: Internal Medicine

## 2014-07-09 DIAGNOSIS — I420 Dilated cardiomyopathy: Secondary | ICD-10-CM

## 2014-07-09 NOTE — Telephone Encounter (Signed)
New problem    Pt need to speak to nurse concerning having a ICD procedure that he want to have. Please call pt.

## 2014-07-12 NOTE — Telephone Encounter (Signed)
Spoke with patient and let him know 08/12/14 will work and he will have labs on 08/06/14 at 4:30

## 2014-07-12 NOTE — Telephone Encounter (Signed)
Follow up      Pt is frustrated that no one has called to schedule the ablation.  Please call soon so that he can arrange to be off work

## 2014-07-16 ENCOUNTER — Telehealth: Payer: Self-pay | Admitting: *Deleted

## 2014-07-16 ENCOUNTER — Other Ambulatory Visit (INDEPENDENT_AMBULATORY_CARE_PROVIDER_SITE_OTHER): Payer: Medicare Other | Admitting: *Deleted

## 2014-07-16 DIAGNOSIS — I5022 Chronic systolic (congestive) heart failure: Secondary | ICD-10-CM

## 2014-07-16 DIAGNOSIS — K047 Periapical abscess without sinus: Secondary | ICD-10-CM

## 2014-07-16 DIAGNOSIS — I10 Essential (primary) hypertension: Secondary | ICD-10-CM

## 2014-07-16 LAB — BASIC METABOLIC PANEL
BUN: 8 mg/dL (ref 6–23)
CALCIUM: 8.9 mg/dL (ref 8.4–10.5)
CO2: 24 mEq/L (ref 19–32)
Chloride: 107 mEq/L (ref 96–112)
Creatinine, Ser: 1 mg/dL (ref 0.4–1.5)
GFR: 93.68 mL/min (ref >60.00)
GLUCOSE: 125 mg/dL — AB (ref 70–99)
Potassium: 3.3 mEq/L — ABNORMAL LOW (ref 3.5–5.1)
Sodium: 141 mEq/L (ref 135–145)

## 2014-07-16 MED ORDER — POTASSIUM CHLORIDE CRYS ER 20 MEQ PO TBCR: 40.0000 meq | EXTENDED_RELEASE_TABLET | Freq: Every day | ORAL | Status: DC

## 2014-07-16 NOTE — Telephone Encounter (Signed)
pt gave permission to s/w wife about lab results and med change with K+ dose, BMET 11/12. Wife verbalized understanding to plan of care.

## 2014-07-21 ENCOUNTER — Encounter: Payer: Self-pay | Admitting: *Deleted

## 2014-07-21 ENCOUNTER — Other Ambulatory Visit: Payer: Self-pay | Admitting: *Deleted

## 2014-07-21 DIAGNOSIS — I420 Dilated cardiomyopathy: Secondary | ICD-10-CM

## 2014-07-22 ENCOUNTER — Other Ambulatory Visit (INDEPENDENT_AMBULATORY_CARE_PROVIDER_SITE_OTHER): Payer: Medicare Other

## 2014-07-22 ENCOUNTER — Other Ambulatory Visit: Payer: Self-pay

## 2014-07-22 DIAGNOSIS — I420 Dilated cardiomyopathy: Secondary | ICD-10-CM

## 2014-07-22 LAB — BASIC METABOLIC PANEL WITH GFR
BUN: 11 mg/dL (ref 6–23)
CO2: 19 meq/L (ref 19–32)
Calcium: 9.2 mg/dL (ref 8.4–10.5)
Chloride: 110 meq/L (ref 96–112)
Creatinine, Ser: 0.9 mg/dL (ref 0.4–1.5)
GFR: 103.04 mL/min
Glucose, Bld: 87 mg/dL (ref 70–99)
Potassium: 3.9 meq/L (ref 3.5–5.1)
Sodium: 142 meq/L (ref 135–145)

## 2014-07-22 LAB — CBC WITH DIFFERENTIAL/PLATELET
BASOS PCT: 0.4 % (ref 0.0–3.0)
Basophils Absolute: 0 10*3/uL (ref 0.0–0.1)
EOS ABS: 0.1 10*3/uL (ref 0.0–0.7)
Eosinophils Relative: 1.1 % (ref 0.0–5.0)
HCT: 36.6 % — ABNORMAL LOW (ref 39.0–52.0)
HEMOGLOBIN: 11.9 g/dL — AB (ref 13.0–17.0)
LYMPHS ABS: 2.6 10*3/uL (ref 0.7–4.0)
LYMPHS PCT: 54.1 % — AB (ref 12.0–46.0)
MCHC: 32.5 g/dL (ref 30.0–36.0)
MCV: 86.8 fl (ref 78.0–100.0)
MONO ABS: 0.3 10*3/uL (ref 0.1–1.0)
Monocytes Relative: 7.3 % (ref 3.0–12.0)
Neutro Abs: 1.8 10*3/uL (ref 1.4–7.7)
Neutrophils Relative %: 37.1 % — ABNORMAL LOW (ref 43.0–77.0)
Platelets: 170 10*3/uL (ref 150.0–400.0)
RBC: 4.21 Mil/uL — ABNORMAL LOW (ref 4.22–5.81)
RDW: 14.2 % (ref 11.5–15.5)
WBC: 4.8 10*3/uL (ref 4.0–10.5)

## 2014-07-30 ENCOUNTER — Ambulatory Visit (INDEPENDENT_AMBULATORY_CARE_PROVIDER_SITE_OTHER): Payer: Medicare Other | Admitting: Cardiology

## 2014-07-30 ENCOUNTER — Encounter: Payer: Self-pay | Admitting: Cardiology

## 2014-07-30 VITALS — BP 156/70 | HR 59 | Ht 68.0 in | Wt 145.5 lb

## 2014-07-30 DIAGNOSIS — I5022 Chronic systolic (congestive) heart failure: Secondary | ICD-10-CM

## 2014-07-30 DIAGNOSIS — I208 Other forms of angina pectoris: Secondary | ICD-10-CM

## 2014-07-30 DIAGNOSIS — I2510 Atherosclerotic heart disease of native coronary artery without angina pectoris: Secondary | ICD-10-CM

## 2014-07-30 DIAGNOSIS — I10 Essential (primary) hypertension: Secondary | ICD-10-CM

## 2014-07-30 DIAGNOSIS — I447 Left bundle-branch block, unspecified: Secondary | ICD-10-CM

## 2014-07-30 NOTE — Patient Instructions (Signed)
We will schedule you for an echocardiogram  I will call you with the results.

## 2014-07-30 NOTE — Progress Notes (Signed)
[name] Date of Birth: 05-09-1941 Medical Record #[id number]  History of Present Illness: [name] is seen back today for follow up of CHF. He has multiple medical issues which include HTN,  HLD and known CAD. He has a history of tobacco abuse. He has LV dysfunction with ejection fraction of 40-45% in April 2013.  Not able to take higher beta blocker dose due to his resting bradycardia.  He had a Myoview study in March of 2013 which showed normal perfusion. Ejection fraction of 40%. He was [facility] in December 2014 with chest pain. Cardiac cath showed patent RCA stent. There was a 50% mid LAD lesion. The first diagonal and first OM were occluded. Attempted PCI of the OM but unable to cross with wire. Review of old films here showed that was a chronic obstruction. He has a history of NSVT. He was admitted in late August with VDRF with acute pulmonary edema. Echo showed drop in EF to 20-25%. In September he was admitted with syncope associated with bradycardia and overdiuresis. He declined Lifevest. diruetics were reduced and coreg was held. On follow up today he reports he is feeling very well. 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 Denies SOB or CP. No recurrent dizziness. He does complain of cramps in his fingers. He has seen Dr. Johney Frame and is scheduled  for BiV ICD implant on Dec. 3. He now states he is not sure he wants this done because he feels so good.   Current Outpatient Prescriptions on File Prior to Visit  Medication Sig Dispense Refill  . albuterol (PROVENTIL HFA;VENTOLIN HFA) 108 (90 BASE) MCG/ACT inhaler Inhale 1 puff into the lungs every 6 (six) hours as needed for wheezing or shortness of breath. 1 Inhaler 6  . aspirin EC 81 MG tablet Take 1 tablet (81 mg total) by mouth daily. 150 tablet 2  . atorvastatin (LIPITOR) 10 MG tablet Take 1 tablet (10 mg total) by mouth daily. 90 tablet 3  . clopidogrel (PLAVIX) 75 MG  tablet Take 1 tablet (75 mg total) by mouth daily. 90 tablet 1  . Fluticasone-Salmeterol (ADVAIR) 250-50 MCG/DOSE AEPB Inhale 1 puff into the lungs 2 (two) times daily.    [name] losartan (COZAAR) 100 MG tablet Take 1 tablet (100 mg total) by mouth daily. 30 tablet 5  . nitroGLYCERIN (NITROSTAT) 0.4 MG SL tablet Place 1 tablet (0.4 mg total) under the tongue every 5 (five) minutes as needed for chest pain. 30 tablet 0  . potassium chloride (K-DUR,KLOR-CON) 20 MEQ tablet Take 2 tablets (40 mEq total) by mouth daily. 60 tablet 11  . tiotropium (SPIRIVA HANDIHALER) 18 MCG inhalation capsule Place 1 capsule (18 mcg total) into inhaler and inhale daily. 30 capsule 11   No current facility-administered medications on file prior to visit.    Allergies  Allergen Reactions  . Aspirin Other (See Comments)    GI upset at high doses only.  . Influenza Vaccines Other (See Comments)    Pt declines  . Pneumococcal Vaccines Other (See Comments)    Pt declines    Past Medical History  Diagnosis Date  . Dyslipidemia     a. on statin  . LBBB (left bundle branch block)   . CAD (coronary artery disease)     a. s/p stent RCA DES 9/09; b. 2014 Attempted PCI of OM1 @ [facility];  c. 04/2014 Cath: LAD 40-50p, D1 95-99 (chronic),  [phone] Health Medical GrouMayford KDurUlysesJohney F43rKoreaaDriscillKarilyn16<MEASMarland(2Z6109[facility] Medical CenSportsortho Surgery21(450Dorathy DafBrownsville8LC4RRHazeline H854-Child psycConley RollshoRiMarland Kitchenck71KoreaiCh629arPierre is normal.  HEENT is unremarkable. Normocephalic/atraumatic. PERRL. Sclera are nonicteric. Neck is supple. No masses. No JVD. Lungs are clear. Cardiac exam shows a regular rate and rhythm. Normal S1-2. No gallop or murmur. Abdomen is soft. Extremities are without edema. Gait and ROM are intact. No gross neurologic deficits noted.   LABORATORY DATA:   Lab Results  Component Value Date   WBC 4.8 07/22/2014   HGB 11.9* 07/22/2014   HCT 36.6* 07/22/2014   PLT 170.0 07/22/2014   GLUCOSE 87 07/22/2014   CHOL 208* 06/18/2013   TRIG 71 05/08/2014   HDL 54.20 06/18/2013   LDLDIRECT 132.9 06/18/2013   LDLCALC 68 11/12/2011   ALT 10 06/20/2014   AST 20 06/20/2014   NA 142 07/22/2014   K 3.9 07/22/2014   CL 110 07/22/2014   CREATININE 0.9 07/22/2014   BUN 11 07/22/2014   CO2 19 07/22/2014   TSH 3.01 06/18/2013   INR 1.09 06/20/2014     Assessment / Plan: 1. Chronic systolic CHF with mixed cardiomyopathy. Last EF 20-25%. Volume status is good. Continue current  lasix, lisinopril and losartan. Coreg on hold with history of bradycardia. Will repeat Echo now to see if EF improved. If it remains less than 35% I have recommended he proceed with BiV ICD for both improvement in CHF and reduction in risk of sudden death.   2. Coronary disease with remote stenting of the right coronary in 2009 with a drug-eluting stent. Chronic occlusion of OM. He is asymptomatic. Myoview study 2013 showed no ischemia.  3. Tobacco abuse. No longer smKentuckyoking  [phone]-[place]236-Nort450316-205-[facility] [place] [phone]-[facility] SuKentuckyrgical [phone]83ch621KentuckKentuckyy84tal [phone]328Knife4290ulGeddUlyses SoKentuckyuthwar34641-408443-054-[facility]0721 Knife7dh40Max90Ulyses SouMayford Knife2941-40Ulyses SouthwaJohney Frameh36Ul567-056GrenadKirke Corin NT>Marland KKentuckyitKentuc740[place]-888763 [phone]-[facility] [place]9524nson SurgerMayford Knifeenter0[place]8 26(80Ulyses So([phone] Corin2 0GrenadKirke CoKentuckyrinKentucky8<BAD(91843-614872-639-[place] Memor256-652GrenadKirke Corin<BADTEXKentuckyTTAG>[phone] 071-19ChristusKentucky St Mich234-831832-443-[facility]urgiKentuckycal Ctr [phone]-ProvKentuckyidence [phone]046l Center0208202- [place]2Mayford Knifent219[place]-Burnsvi236-089269 [address] [facility] [place], [phone]-[phone] Corind Kniferdia  on Lipitor.   5. COPD controlled on inhaler therapy  6. HTN controlled.  7. NSVT  8. Bradycardia resolved off beta blocker. Could consider again if he has pacer/ICD in place.

## 2014-08-02 ENCOUNTER — Ambulatory Visit (HOSPITAL_COMMUNITY)
Admission: RE | Admit: 2014-08-02 | Discharge: 2014-08-02 | Disposition: A | Discharge status: Home / Self Care | Payer: Medicare Other | Source: Ambulatory Visit | Attending: Cardiology | Admitting: Cardiology

## 2014-08-02 DIAGNOSIS — I509 Heart failure, unspecified: Secondary | ICD-10-CM | POA: Insufficient documentation

## 2014-08-02 DIAGNOSIS — Z87891 Personal history of nicotine dependence: Secondary | ICD-10-CM | POA: Diagnosis not present

## 2014-08-02 DIAGNOSIS — E785 Hyperlipidemia, unspecified: Secondary | ICD-10-CM | POA: Diagnosis not present

## 2014-08-02 DIAGNOSIS — I447 Left bundle-branch block, unspecified: Secondary | ICD-10-CM

## 2014-08-02 DIAGNOSIS — I2510 Atherosclerotic heart disease of native coronary artery without angina pectoris: Secondary | ICD-10-CM

## 2014-08-02 DIAGNOSIS — I5022 Chronic systolic (congestive) heart failure: Secondary | ICD-10-CM

## 2014-08-02 DIAGNOSIS — I10 Essential (primary) hypertension: Secondary | ICD-10-CM | POA: Diagnosis not present

## 2014-08-02 DIAGNOSIS — I517 Cardiomegaly: Secondary | ICD-10-CM

## 2014-08-02 NOTE — Progress Notes (Signed)
2D Echo Performed 08/02/2014    Keyandre Pileggi, RCS  

## 2014-08-02 NOTE — Progress Notes (Deleted)
2D Echo Performed 08/02/2014    Creig Landin, RCS  

## 2014-08-06 ENCOUNTER — Other Ambulatory Visit (INDEPENDENT_AMBULATORY_CARE_PROVIDER_SITE_OTHER): Payer: Medicare Other | Admitting: *Deleted

## 2014-08-06 ENCOUNTER — Other Ambulatory Visit: Payer: Self-pay

## 2014-08-06 DIAGNOSIS — I10 Essential (primary) hypertension: Secondary | ICD-10-CM

## 2014-08-06 DIAGNOSIS — I2510 Atherosclerotic heart disease of native coronary artery without angina pectoris: Secondary | ICD-10-CM

## 2014-08-06 DIAGNOSIS — I5022 Chronic systolic (congestive) heart failure: Secondary | ICD-10-CM

## 2014-08-06 DIAGNOSIS — I447 Left bundle-branch block, unspecified: Secondary | ICD-10-CM

## 2014-08-06 LAB — CBC WITH DIFFERENTIAL/PLATELET
BASOS ABS: 0 10*3/uL (ref 0.0–0.1)
BASOS PCT: 0 % (ref 0–1)
Eosinophils Absolute: 0 10*3/uL (ref 0.0–0.7)
Eosinophils Relative: 1 % (ref 0–5)
HEMATOCRIT: 33.6 % — AB (ref 39.0–52.0)
Hemoglobin: 11.2 g/dL — ABNORMAL LOW (ref 13.0–17.0)
Lymphocytes Relative: 57 % — ABNORMAL HIGH (ref 12–46)
Lymphs Abs: 2.7 10*3/uL (ref 0.7–4.0)
MCH: 28.4 pg (ref 26.0–34.0)
MCHC: 33.3 g/dL (ref 30.0–36.0)
MCV: 85.3 fL (ref 78.0–100.0)
MONO ABS: 0.3 10*3/uL (ref 0.1–1.0)
MPV: 9.7 fL (ref 9.4–12.4)
Monocytes Relative: 6 % (ref 3–12)
Neutro Abs: 1.7 10*3/uL (ref 1.7–7.7)
Neutrophils Relative %: 36 % — ABNORMAL LOW (ref 43–77)
PLATELETS: 171 10*3/uL (ref 150–400)
RBC: 3.94 MIL/uL — ABNORMAL LOW (ref 4.22–5.81)
RDW: 14.3 % (ref 11.5–15.5)
WBC: 4.8 10*3/uL (ref 4.0–10.5)

## 2014-08-06 LAB — BASIC METABOLIC PANEL
BUN: 10 mg/dL (ref 6–23)
CALCIUM: 9 mg/dL (ref 8.4–10.5)
CHLORIDE: 110 meq/L (ref 96–112)
CO2: 25 mEq/L (ref 19–32)
CREATININE: 0.93 mg/dL (ref 0.50–1.35)
Glucose, Bld: 79 mg/dL (ref 70–99)
Potassium: 3.7 mEq/L (ref 3.5–5.3)
Sodium: 141 mEq/L (ref 135–145)

## 2014-08-11 ENCOUNTER — Encounter (HOSPITAL_COMMUNITY): Payer: Self-pay | Admitting: Pharmacy Technician

## 2014-08-11 DIAGNOSIS — J449 Chronic obstructive pulmonary disease, unspecified: Secondary | ICD-10-CM | POA: Diagnosis not present

## 2014-08-11 DIAGNOSIS — I252 Old myocardial infarction: Secondary | ICD-10-CM | POA: Diagnosis not present

## 2014-08-11 DIAGNOSIS — I255 Ischemic cardiomyopathy: Secondary | ICD-10-CM | POA: Diagnosis not present

## 2014-08-11 DIAGNOSIS — Z7982 Long term (current) use of aspirin: Secondary | ICD-10-CM | POA: Diagnosis not present

## 2014-08-11 DIAGNOSIS — Z79899 Other long term (current) drug therapy: Secondary | ICD-10-CM | POA: Diagnosis not present

## 2014-08-11 DIAGNOSIS — E785 Hyperlipidemia, unspecified: Secondary | ICD-10-CM | POA: Diagnosis not present

## 2014-08-11 DIAGNOSIS — Z7902 Long term (current) use of antithrombotics/antiplatelets: Secondary | ICD-10-CM | POA: Diagnosis not present

## 2014-08-11 DIAGNOSIS — I447 Left bundle-branch block, unspecified: Secondary | ICD-10-CM | POA: Diagnosis not present

## 2014-08-11 DIAGNOSIS — I2510 Atherosclerotic heart disease of native coronary artery without angina pectoris: Secondary | ICD-10-CM | POA: Diagnosis not present

## 2014-08-11 DIAGNOSIS — I5042 Chronic combined systolic (congestive) and diastolic (congestive) heart failure: Secondary | ICD-10-CM | POA: Diagnosis not present

## 2014-08-11 DIAGNOSIS — I10 Essential (primary) hypertension: Secondary | ICD-10-CM | POA: Diagnosis not present

## 2014-08-11 DIAGNOSIS — Z87891 Personal history of nicotine dependence: Secondary | ICD-10-CM | POA: Diagnosis not present

## 2014-08-11 MED ORDER — CEFAZOLIN SODIUM-DEXTROSE 2-3 GM-% IV SOLR: 2.0000 g | INTRAVENOUS | Status: DC

## 2014-08-11 MED ORDER — SODIUM CHLORIDE 0.9 % IR SOLN
80.0000 mg | Status: DC
  Filled 2014-08-11: qty 2

## 2014-08-12 ENCOUNTER — Encounter (HOSPITAL_COMMUNITY)
Admission: RE | Disposition: A | Discharge status: Home / Self Care | Payer: Self-pay | Source: Ambulatory Visit | Attending: Internal Medicine

## 2014-08-12 ENCOUNTER — Encounter (HOSPITAL_COMMUNITY): Payer: Self-pay | Admitting: *Deleted

## 2014-08-12 ENCOUNTER — Ambulatory Visit (HOSPITAL_COMMUNITY)
Admission: RE | Admit: 2014-08-12 | Discharge: 2014-08-13 | Disposition: A | Discharge status: Home / Self Care | Payer: Medicare Other | Source: Ambulatory Visit | Attending: Internal Medicine | Admitting: Internal Medicine

## 2014-08-12 DIAGNOSIS — I420 Dilated cardiomyopathy: Secondary | ICD-10-CM

## 2014-08-12 DIAGNOSIS — J449 Chronic obstructive pulmonary disease, unspecified: Secondary | ICD-10-CM | POA: Insufficient documentation

## 2014-08-12 DIAGNOSIS — I447 Left bundle-branch block, unspecified: Secondary | ICD-10-CM | POA: Diagnosis not present

## 2014-08-12 DIAGNOSIS — Z95810 Presence of automatic (implantable) cardiac defibrillator: Secondary | ICD-10-CM

## 2014-08-12 DIAGNOSIS — I10 Essential (primary) hypertension: Secondary | ICD-10-CM | POA: Insufficient documentation

## 2014-08-12 DIAGNOSIS — I2510 Atherosclerotic heart disease of native coronary artery without angina pectoris: Secondary | ICD-10-CM | POA: Insufficient documentation

## 2014-08-12 DIAGNOSIS — Z7982 Long term (current) use of aspirin: Secondary | ICD-10-CM | POA: Insufficient documentation

## 2014-08-12 DIAGNOSIS — I252 Old myocardial infarction: Secondary | ICD-10-CM | POA: Insufficient documentation

## 2014-08-12 DIAGNOSIS — Z7902 Long term (current) use of antithrombotics/antiplatelets: Secondary | ICD-10-CM | POA: Insufficient documentation

## 2014-08-12 DIAGNOSIS — I255 Ischemic cardiomyopathy: Secondary | ICD-10-CM | POA: Diagnosis not present

## 2014-08-12 DIAGNOSIS — I5022 Chronic systolic (congestive) heart failure: Secondary | ICD-10-CM

## 2014-08-12 DIAGNOSIS — E785 Hyperlipidemia, unspecified: Secondary | ICD-10-CM | POA: Insufficient documentation

## 2014-08-12 DIAGNOSIS — Z87891 Personal history of nicotine dependence: Secondary | ICD-10-CM | POA: Insufficient documentation

## 2014-08-12 DIAGNOSIS — I5042 Chronic combined systolic (congestive) and diastolic (congestive) heart failure: Secondary | ICD-10-CM | POA: Diagnosis not present

## 2014-08-12 DIAGNOSIS — Z79899 Other long term (current) drug therapy: Secondary | ICD-10-CM | POA: Insufficient documentation

## 2014-08-12 HISTORY — PX: BI-VENTRICULAR IMPLANTABLE CARDIOVERTER DEFIBRILLATOR: SHX5459

## 2014-08-12 LAB — GLUCOSE, CAPILLARY: Glucose-Capillary: 92 mg/dL (ref 70–99)

## 2014-08-12 LAB — SURGICAL PCR SCREEN
MRSA, PCR: NEGATIVE
Staphylococcus aureus: NEGATIVE

## 2014-08-12 SURGERY — BI-VENTRICULAR IMPLANTABLE CARDIOVERTER DEFIBRILLATOR  (CRT-D)
Anesthesia: LOCAL

## 2014-08-12 MED ORDER — CEFAZOLIN SODIUM 1-5 GM-% IV SOLN
1.0000 g | Freq: Four times a day (QID) | INTRAVENOUS | Status: AC
  Administered 2014-08-12 – 2014-08-13 (×3): 1 g via INTRAVENOUS
  Filled 2014-08-12 (×4): qty 50

## 2014-08-12 MED ORDER — POTASSIUM CHLORIDE CRYS ER 20 MEQ PO TBCR
40.0000 meq | EXTENDED_RELEASE_TABLET | Freq: Every day | ORAL | Status: DC
  Administered 2014-08-12 – 2014-08-13 (×2): 40 meq via ORAL
  Filled 2014-08-12 (×2): qty 2

## 2014-08-12 MED ORDER — HEPARIN (PORCINE) IN NACL 2-0.9 UNIT/ML-% IJ SOLN
INTRAMUSCULAR | Status: AC
  Filled 2014-08-12: qty 500

## 2014-08-12 MED ORDER — CLOPIDOGREL BISULFATE 75 MG PO TABS
75.0000 mg | ORAL_TABLET | Freq: Every day | ORAL | Status: DC
  Administered 2014-08-13: 75 mg via ORAL
  Filled 2014-08-12 (×2): qty 1

## 2014-08-12 MED ORDER — MIDAZOLAM HCL 5 MG/5ML IJ SOLN
INTRAMUSCULAR | Status: AC
  Filled 2014-08-12: qty 5

## 2014-08-12 MED ORDER — ACETAMINOPHEN 325 MG PO TABS: 325.0000 mg | ORAL_TABLET | ORAL | Status: DC | PRN

## 2014-08-12 MED ORDER — LIDOCAINE HCL (PF) 1 % IJ SOLN
INTRAMUSCULAR | Status: AC
  Filled 2014-08-12: qty 60

## 2014-08-12 MED ORDER — SODIUM CHLORIDE 0.9 % IV SOLN
INTRAVENOUS | Status: DC
  Administered 2014-08-12: 07:00:00 via INTRAVENOUS

## 2014-08-12 MED ORDER — HYDROCODONE-ACETAMINOPHEN 5-325 MG PO TABS
1.0000 | ORAL_TABLET | ORAL | Status: DC | PRN
  Administered 2014-08-12: 2 via ORAL
  Filled 2014-08-12: qty 2

## 2014-08-12 MED ORDER — LOSARTAN POTASSIUM 50 MG PO TABS
100.0000 mg | ORAL_TABLET | Freq: Every day | ORAL | Status: DC
  Administered 2014-08-12 – 2014-08-13 (×2): 100 mg via ORAL
  Filled 2014-08-12 (×2): qty 2

## 2014-08-12 MED ORDER — FENTANYL CITRATE 0.05 MG/ML IJ SOLN
INTRAMUSCULAR | Status: AC
Start: 2014-08-12 — End: 2014-08-12
  Filled 2014-08-12: qty 2

## 2014-08-12 MED ORDER — CEFAZOLIN SODIUM-DEXTROSE 2-3 GM-% IV SOLR
INTRAVENOUS | Status: AC
  Filled 2014-08-12: qty 50

## 2014-08-12 MED ORDER — MOMETASONE FURO-FORMOTEROL FUM 100-5 MCG/ACT IN AERO
2.0000 | INHALATION_SPRAY | Freq: Two times a day (BID) | RESPIRATORY_TRACT | Status: DC
  Administered 2014-08-13: 2 via RESPIRATORY_TRACT
  Filled 2014-08-12: qty 8.8

## 2014-08-12 MED ORDER — SODIUM CHLORIDE 0.9 % IJ SOLN
3.0000 mL | Freq: Two times a day (BID) | INTRAMUSCULAR | Status: DC
  Administered 2014-08-12: 3 mL via INTRAVENOUS

## 2014-08-12 MED ORDER — CARVEDILOL 3.125 MG PO TABS
3.1250 mg | ORAL_TABLET | Freq: Two times a day (BID) | ORAL | Status: DC
  Administered 2014-08-13: 3.125 mg via ORAL
  Filled 2014-08-12 (×2): qty 1

## 2014-08-12 MED ORDER — SODIUM CHLORIDE 0.9 % IV SOLN: 250.0000 mL | INTRAVENOUS | Status: DC | PRN

## 2014-08-12 MED ORDER — TIOTROPIUM BROMIDE MONOHYDRATE 18 MCG IN CAPS
18.0000 ug | ORAL_CAPSULE | Freq: Every day | RESPIRATORY_TRACT | Status: DC
  Filled 2014-08-12: qty 5

## 2014-08-12 MED ORDER — ONDANSETRON HCL 4 MG/2ML IJ SOLN: 4.0000 mg | Freq: Four times a day (QID) | INTRAMUSCULAR | Status: DC | PRN

## 2014-08-12 MED ORDER — SODIUM CHLORIDE 0.9 % IJ SOLN: 3.0000 mL | INTRAMUSCULAR | Status: DC | PRN

## 2014-08-12 MED ORDER — MUPIROCIN 2 % EX OINT
1.0000 "application " | TOPICAL_OINTMENT | Freq: Once | CUTANEOUS | Status: AC
  Administered 2014-08-12: 1 via TOPICAL

## 2014-08-12 MED ORDER — MUPIROCIN 2 % EX OINT
TOPICAL_OINTMENT | CUTANEOUS | Status: AC
  Administered 2014-08-12: 1 via TOPICAL
  Filled 2014-08-12: qty 22

## 2014-08-12 MED ORDER — ALBUTEROL SULFATE (2.5 MG/3ML) 0.083% IN NEBU: 2.5000 mg | INHALATION_SOLUTION | Freq: Four times a day (QID) | RESPIRATORY_TRACT | Status: DC | PRN

## 2014-08-12 MED ORDER — CHLORHEXIDINE GLUCONATE 4 % EX LIQD: 60.0000 mL | Freq: Once | CUTANEOUS | Status: DC

## 2014-08-12 MED ORDER — ISOSORBIDE MONONITRATE ER 30 MG PO TB24
30.0000 mg | ORAL_TABLET | Freq: Every day | ORAL | Status: DC
  Administered 2014-08-12 – 2014-08-13 (×2): 30 mg via ORAL
  Filled 2014-08-12 (×2): qty 1

## 2014-08-12 MED ORDER — LISINOPRIL 2.5 MG PO TABS
2.5000 mg | ORAL_TABLET | Freq: Every day | ORAL | Status: DC
  Filled 2014-08-12: qty 1

## 2014-08-12 NOTE — Discharge Summary (Signed)
ELECTROPHYSIOLOGY PROCEDURE DISCHARGE SUMMARY    Patient ID: [name],  MRN: [id number], DOB/AGE: Jul 20, 1941 73 y.o.  Admit date: 08/12/2014 Discharge date: 08/13/2014  Primary Care Physician: Rene PaciValerie Leschber, MD Primary Cardiologist: [place] Electrophysiologist: Cecil Bixby  Primary Discharge Diagnosis:  Ischemic cardiomyopathy, congestive heart failure, and LBBB status post CRTD implant this admission  Secondary Discharge Diagnosis:  1.  CAD - s/p stent RCA DES 9/09; b. 2014 Attempted PCI of OM1 @ [facility]; c. 04/2014 Cath: LAD 40-50p, D1 95-99 (chronic), LCX 30-40 inf branch, OM1 CTO, RCA 30-40p, RCA patent stent, EF 35%->Med Rx. 2.  Dyslipidemia 3.  Hypertension 4.  COPD 5.  NSVT  Allergies  Allergen Reactions  . Acyclovir And Related   . Aspirin Other (See Comments)    GI upset at high doses only.     Procedures This Admission:  1.  Implantation of a MDT CRTD on 08-12-2014 by Dr Johney FrameAllred.  The patient received a MDT model number Ovidio KinViva Quad XT ICD with model number 5076 right atrial lead, 6935 right ventricular lead, and 4598 left ventricular lead.  DFT's were deferred at time of implant.  There were no immediate post procedure complications. 2.  CXR on 08-13-2014 demonstrated no pneumothorax status post device implantation.   Brief HPI: Vincent DemarkGlenn A Brewer is a 73 y.o. male was referred to electrophysiology in the outpatient setting for consideration of ICD implantation.  Past medical history includes ischemic cardiomyopathy, class III CHF, and LBBB.  The patient has persistent LV dysfunction despite guideline directed therapy.  Risks, benefits, and alternatives to ICD implantation were reviewed with the patient who wished to proceed.   Hospital Course:  The patient was admitted and underwent implantation of a MDT CRTD with details as outlined above.   He was monitored on telemetry overnight which demonstrated sinus rhythm with ventricular pacing and 1 run of NSVT at  440msec.  Left chest was without hematoma or ecchymosis.  The device was interrogated and found to be functioning normally.  CXR was obtained and demonstrated no pneumothorax status post device implantation.  Wound care, arm mobility, and restrictions were reviewed with the patient.  Dr Johney FrameAllred examined the patient and considered them stable for discharge to home.   The patient's discharge medications include an ARB (Losartan) and beta blocker (Carvedilol). Beta blocker prior to device implantation was prohibited due to bradycardia.   Discharge Vitals: Blood pressure 144/75, pulse 73, temperature 99 F (37.2 C), temperature source Oral, resp. rate 18, height 5' 8.5" (1.74 m), weight 141 lb 6.4 oz (64.139 kg), SpO2 100 %.   Physical Exam: Filed Vitals:   08/12/14 1755 08/12/14 2007 08/13/14 0433 08/13/14 1047  BP: 140/67 171/72 185/73 144/75  Pulse: 50 52 57 73  Temp: 98 F (36.7 C) 98 F (36.7 C) 99 F (37.2 C)   TempSrc: Oral Oral Oral   Resp:  20 18   Height:      Weight:   141 lb 6.4 oz (64.139 kg)   SpO2: 100% 100% 100%     GEN- The patient is well appearing, alert and oriented x 3 today.   Head- normocephalic, atraumatic Eyes-  Sclera clear, conjunctiva pink Ears- hearing intact Oropharynx- clear Neck- supple, Lungs- Clear to ausculation bilaterally, normal work of breathing Heart- Regular rate and rhythm, no murmurs, rubs or gallops, PMI not laterally displaced GI- soft, NT, ND, + BS Extremities- no clubbing, cyanosis, or edema  ICD pocket is without hematoma   Labs:  Lab Results  Component Value Date   WBC 4.8 08/06/2014   HGB 11.2* 08/06/2014   HCT 33.6* 08/06/2014   MCV 85.3 08/06/2014   PLT 171 08/06/2014     Recent Labs Lab 08/13/14 0326  NA 141  K 3.9  CL 104  CO2 24  BUN 9  CREATININE 0.77  CALCIUM 8.7  GLUCOSE 109*     Discharge Medications:    Medication List    STOP taking these medications        lisinopril 2.5 MG tablet  Commonly  known as:  PRINIVIL,ZESTRIL      TAKE these medications        albuterol 108 (90 BASE) MCG/ACT inhaler  Commonly known as:  PROVENTIL HFA;VENTOLIN HFA  Inhale 1 puff into the lungs every 6 (six) hours as needed for wheezing or shortness of breath.     aspirin EC 81 MG tablet  Take 1 tablet (81 mg total) by mouth daily.     atorvastatin 10 MG tablet  Commonly known as:  LIPITOR  Take 1 tablet (10 mg total) by mouth daily.     carvedilol 3.125 MG tablet  Commonly known as:  COREG  Take 1 tablet (3.125 mg total) by mouth 2 (two) times daily with a meal.     clopidogrel 75 MG tablet  Commonly known as:  PLAVIX  Take 75 mg by mouth daily.     Fluticasone-Salmeterol 250-50 MCG/DOSE Aepb  Commonly known as:  ADVAIR  Inhale 1 puff into the lungs 2 (two) times daily.     furosemide 40 MG tablet  Commonly known as:  LASIX  Take 20 mg by mouth daily.     isosorbide mononitrate 30 MG 24 hr tablet  Commonly known as:  IMDUR  Take 30 mg by mouth daily.     losartan 100 MG tablet  Commonly known as:  COZAAR  Take 1 tablet (100 mg total) by mouth daily.     nitroGLYCERIN 0.4 MG SL tablet  Commonly known as:  NITROSTAT  Place 1 tablet (0.4 mg total) under the tongue every 5 (five) minutes as needed for chest pain.     potassium chloride SA 20 MEQ tablet  Commonly known as:  K-DUR,KLOR-CON  Take 2 tablets (40 mEq total) by mouth daily.     tiotropium 18 MCG inhalation capsule  Commonly known as:  SPIRIVA HANDIHALER  Place 1 capsule (18 mcg total) into inhaler and inhale daily.        Disposition:  Discharge Instructions    Diet - low sodium heart healthy    Complete by:  As directed      Increase activity slowly    Complete by:  As directed           Follow-up Information    Follow up with [facility] On 08/25/2014.   Why:  Wound check and device check at 9:00 AM   Contact information:   [address] [place] [address]         Duration of Discharge Encounter: Greater than 30 minutes including physician time.  Signed,  Hillis Range MD

## 2014-08-12 NOTE — Op Note (Signed)
SURGEON: Vincent RangeJames Daksh Coates, MD   PREPROCEDURE DIAGNOSES:  1. Ischemic cardiomyopathy.  2. New York Heart Association class III, heart failure chronically.  3. Left bundle-branch block.   POSTPROCEDURE DIAGNOSES:  1. Ischemic cardiomyopathy.  2. New York Heart Association class III heart failure chronically.  3. Left bundle-branch block.   PROCEDURES:  1. Left upper extremity venography  2. Biventricular ICD implantation.   INTRODUCTION:  [name] is a 73 y.o. male with a ischemic CM (EF 30%), NYHA Class III CHF, and LBBB QRS morophology. At this time, he  meets MADIT II/ SCD-HeFT criteria for ICD implantation for primary prevention of sudden death. Given LBBB, the patient may also be expected to benefit from resynchronization therapy.  The patient has been treated with an optimal medical regimen but continues to have a depressed ejection fraction and NYHA Class III CHF symptoms. he therefore presents today for a biventricular ICD implantation.   DESCRIPTION OF PROCEDURE: Informed written consent was obtained and the patient was brought to the electrophysiology lab in the fasting state. The patient was adequately sedated with intravenous Versed, and fentanyl as outlined in the nursing report. The patient's left chest was prepped and draped in the usual sterile fashion by the EP lab staff. The skin overlying the left deltopectoral region was infiltrated with lidocaine for local analgesia. A 5-cm incision was made over the left deltopectoral region. A left subcutaneous defibrillator pocket was fashioned using a combination of sharp and blunt dissection. Electrocautery was used to assure hemostasis.    LUE venography:  A venogram of the left upper extremity was performed by hand injection of nonionic contrast.  This demonstrated a large left axillary vein and cephalic vein which emptied into a large left subclavian vein.  RA/RV Lead Placement:  The left axillary vein was cannulated with  fluoroscopic visualization. Through the left axillary vein, a Medtronic model [id number] (serial # [id number]) right atrial lead and a Medtronic model [id number] (serial number [id number]) right ventricular defibrillator lead were advanced with fluoroscopic visualization into the right atrial appendage and right ventricular apex positions respectively. Initial atrial lead P-waves measured 5.7  mV with an impedance of 720 ohms and a threshold of 1 volts at 0.5 milliseconds. The right ventricular lead R-wave measured 12 mV with impedance of 733 ohms and a threshold of 1 volts at 0.5 milliseconds.   LV Lead Placement:  A Medtronic MB-2 guide was advanced through the left axillary vein into the low lateral right atrium. A Bard curved Damato catheter was introduced through the MB-2 guide and used to cannulate the coronary sinus. Coronary sinus cannulation was confirmed with electrogram recording from the hexapolar catheter. A nonselective coronary sinus venogram was performed by hand injection of nonionic contrast. This demonstrated a moderate CS body.  There was a very early posterior branch which was large in size.  There was a more distal lateral vein which was also moderate in size.  A Whisper CSJ wire was introduced through the guide and advanced into the distal lateral branch. A Medtronic model 4598 - 88 (serial number [id number]) lead was advanced through the MB-2 into the lateral branch. This was approximately two-thirds from the base to the apex in a very lateral position. In this location, the left ventricular lead R-waves measured 6 mV with impedance of 500 ohms and a threshold of 0.6 volt at 0.5 milliseconds in the bipolar LV4-coil configuration with no diaphragmatic stimulation observed when pacing at 10 volts output. The MB-2 guide was therefore removed.  All three leads were secured to the pectoralis fascia using #2 silk suture over the suture sleeves. The pocket then irrigated with copious gentamicin  solution. The leads were then connected to a Medtronic model DTBA1Q1 Ovidio Kin XT CRT-D (serial Number [id number]) device. The defibrillator was placed into the pocket. The pocket was then closed in 2 layers with 2.0 Vicryl suture for the subcutaneous and subcuticular layers. Steri-Strips and a sterile dressing were then applied.  DFT testing was not performed today. The procedure was therefore considered completed. EBL<42ml. There were no early apparhent complications.   CONCLUSIONS:  1. Ischemic cardiomyopathy with Left bundle-branch block and chronic New York Heart Association class III heart failure.  2. Successful biventricular ICD implantation.  3. No early apparent complications.

## 2014-08-12 NOTE — H&P (Signed)
PCP: Rene Paci, MD Cardiologist: Dr. Peter [place]   History of Present Illness: [name] is a 73 y.o. male with a hx of CAD s/p prior PCI to the RCA and known occlusion of D1 and OM1 (unsuccesful attempt at PCI of OM1 at [facility] in 2014), DCM (likely mixed ischemic/non-ischemic) with EF 30% t, LBBB, HTN, HL, tobacco abuse. He presents today for ICD implantation.  Admitted 04/2014 with VDRF in the setting of acute pulmonary edema/HTN emergency. He was intubated by EMS in the field with reported VTach x 30 sec during intubation >> self converted. Emergent LHC demonstrated stable anatomy. Treated with antibiotics to cover for possible pneumonia.  CRT-D was recommended given low EF, LBBB, NSVT. Patient declined and was DC'd on LifeVest. I saw him in the office 10/15.  Though he initially declined BiV ICD, after discussions with Dr [place], he now wishes to proceed.  Studies: - LHC (8/15): prox LAD 40-50%, D1 95-99%, Int Br of CFX 30-40%, OM1 with CTO (L>L collats), prox RCA 30-40%, mid RCA stent ok, EF 35% - Echo (8/15): Severe LVH, EF 20-25%, severe global HK, Gr 1 DD, mild reduced RVF, PASP 38 mmHg, small eff - Nuclear (3/13): Normal perfusion, EF 40%      Past Medical History  Diagnosis Date  . Dyslipidemia     a. on statin  . LBBB (left bundle branch block)   . CAD (coronary artery disease)     a. s/p stent RCA DES 9/09; b. 2014 Attempted PCI of OM1 @ [place]; c. 04/2014 Cath: LAD 40-50p, D1 95-99 (chronic), LCX 30-40 inf branch, OM1 CTO, RCA 30-40p, RCA patent stent, EF 35%->Med Rx.  . HTN (hypertension)     a. Reports intolerance to hydralazine; b. no beta blockers 2/2 bradycardia; c. failed on ACE and ARB.  [name] Noncompliance   . Chronic combined systolic and diastolic CHF (congestive heart failure)     a. EF about 40 to 45% per echo in April 2013; b. 04/2014 Echo: EF 20-25%, sev LVH, sev glob HK, Gr 1 DD, mildly  reduced RV fxn, PASP .  [name] LVH (left ventricular hypertrophy)   . COPD (chronic obstructive pulmonary disease) dx 06/2013    PFTs 07/08/13: mod obst with resp to bronchodilator, moderate decrease diffusion, airtrapping  . Chronic low back pain   . Mixed Ischemic/Non-ischemic Cardiomyopathy     a. 04/2014 Echo: EF 20-25%, sev glob HK.  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 NSVT (nonsustained ventricular tachycardia)     a. LifeVest applied 04/2014 admission->refused ICD placement.  [name] HLD (hyperlipidemia)     Current Outpatient Prescriptions  Medication Sig Dispense Refill  . albuterol (PROVENTIL HFA;VENTOLIN HFA) 108 (90 BASE) MCG/ACT inhaler Inhale 1 puff into the lungs every 6 (six) hours as needed for wheezing or shortness of breath. 1 Inhaler 6  . aspirin EC 81 MG tablet Take 1 tablet (81 mg total) by mouth daily. 150 tablet 2  . atorvastatin (LIPITOR) 10 MG tablet Take 1 tablet (10 mg total) by mouth daily. 90 tablet 3  . clopidogrel (PLAVIX) 75 MG tablet Take 1 tablet (75 mg total) by mouth daily. 90 tablet 1  . Fluticasone-Salmeterol (ADVAIR) 250-50 MCG/DOSE AEPB Inhale 1 puff into the lungs 2 (two) times daily.    . furosemide (LASIX) 20 MG tablet Take 1 tablet (20 mg total) by mouth daily. 10 tablet 0  . isosorbide mononitrate (IMDUR) 60 MG 24 hr tablet Take 60 mg by mouth daily.    [name]  losartan (COZAAR) 100 MG tablet Take 1 tablet (100 mg total) by mouth daily. 30 tablet 5  . nitroGLYCERIN (NITROSTAT) 0.4 MG SL tablet Place 1 tablet (0.4 mg total) under the tongue every 5 (five) minutes as needed for chest pain. 30 tablet 0  . potassium chloride SA (K-DUR,KLOR-CON) 10 MEQ tablet Take 1 tablet (10 mEq total) by mouth daily.    [name]. tiotropium (SPIRIVA HANDIHALER) 18 MCG inhalation capsule Place 1 capsule (18 mcg total) into inhaler and inhale daily. 30 capsule 11    No current facility-administered medications for this visit.     Allergies: Aspirin; Influenza vaccines; and Pneumococcal vaccines   Social History: The patient  reports that he quit smoking about 21 months ago. His smoking use included Cigarettes. He has a 28 pack-year smoking history. He does not have any smokeless tobacco history on file. He reports that he does not drink alcohol or use illicit drugs.   Family History: The patient's family history is negative for Heart attack and Stroke.   ROS: Please see the history of present illness. He has had some sore throat. All other systems reviewed and negative.   PHYSICAL EXAM: Filed Vitals:   08/12/14 0542  BP: 184/84  Pulse: 70  Temp: 98.3 F (36.8 C)  Resp: 18    Well nourished, well developed, in no acute distress  HEENT: normal  Neck: no JVD  Cardiac: normal S1, S2; RRR; no murmur  Lungs: clear to auscultation bilaterally, no wheezing, rhonchi or rales  Abd: soft, nontender, no hepatomegaly  Ext: no edema  Skin: warm and dry  Neuro: CNs 2-12 intact, no focal abnormalities noted  EKG: NSR, first degree AV block,LBBB   ASSESSMENT AND PLAN: The patient has an ischemic CM (EF 30%), NYHA Class III CHF, and CAD. He has LBBB with QRS > 150 msec. At this time, he meets MADIT II/ SCD-HeFT criteria for ICD implantation for primary prevention of sudden death.  Given LBBB, he is also a candidate for CRT.  Risks, benefits, alternatives to BiVICD implantation were discussed in detail with the patient today. The patient  understands that the risks include but are not limited to bleeding, infection, pneumothorax, perforation, tamponade, vascular damage, renal failure, MI, stroke, death, inappropriate shocks, and lead dislodgement and wishes to proceed at this time.   ICD Criteria  Current LVEF:30% ;Obtained < 1 month ago.  NYHA Functional Classification: Class III  Heart Failure History:  Yes, Duration of heart  failure since onset is > 9 months  Non-Ischemic Dilated Cardiomyopathy History:  No.  Atrial Fibrillation/Atrial Flutter:  No.  Ventricular Tachycardia History:  Yes, No hemodynamic instability. VT Type:  NSVT.  Cardiac Arrest History:  No  History of Syndromes with Risk of Sudden Death:  No.  Previous ICD:  No.  Electrophysiology Study: No.  Prior MI: Yes, Most recent MI timeframe is > 40 days.  PPM: No.  OSA:  No  Patient Life Expectancy of >=1 year: Yes.  Anticoagulation Therapy:  Patient is NOT on anticoagulation therapy.   Beta Blocker Therapy:  No, Reason not on Beta Blocker therapy: bradycardia  Ace Inhibitor/ARB Therapy:  Yes.

## 2014-08-13 ENCOUNTER — Ambulatory Visit (HOSPITAL_COMMUNITY): Payer: Medicare Other

## 2014-08-13 DIAGNOSIS — I255 Ischemic cardiomyopathy: Secondary | ICD-10-CM | POA: Diagnosis not present

## 2014-08-13 LAB — BASIC METABOLIC PANEL
Anion gap: 13 (ref 5–15)
BUN: 9 mg/dL (ref 6–23)
CHLORIDE: 104 meq/L (ref 96–112)
CO2: 24 mEq/L (ref 19–32)
Calcium: 8.7 mg/dL (ref 8.4–10.5)
Creatinine, Ser: 0.77 mg/dL (ref 0.50–1.35)
GFR, EST NON AFRICAN AMERICAN: 90 mL/min — AB (ref >90)
Glucose, Bld: 109 mg/dL — ABNORMAL HIGH (ref 70–99)
Potassium: 3.9 mEq/L (ref 3.7–5.3)
SODIUM: 141 meq/L (ref 137–147)

## 2014-08-13 MED ORDER — CARVEDILOL 3.125 MG PO TABS: 3.1250 mg | ORAL_TABLET | Freq: Two times a day (BID) | ORAL | Status: DC

## 2014-08-13 NOTE — Discharge Instructions (Signed)
° °  Supplemental Discharge Instructions for  Pacemaker/Defibrillator Patients  Activity No heavy lifting or vigorous activity with your left/right arm for 6 to 8 weeks.  Do not raise your left/right arm above your head for one week.  Gradually raise your affected arm as drawn below.                       12/07                   12/08                    12/09                    12/10            NO DRIVING until cleared by M.D. WOUND CARE - Keep the wound area clean and dry.  Do not get this area wet for one week. No showers for one week; you may shower on      12/11        . - The tape/steri-strips on your wound will fall off; do not pull them off.  No bandage is needed on the site.  DO  NOT apply any creams, oils, or ointments to the wound area. - If you notice any drainage or discharge from the wound, any swelling or bruising at the site, or you develop a fever > 101? F after you are discharged home, call the office at once.  Special Instructions - You are still able to use cellular telephones; use the ear opposite the side where you have your pacemaker/defibrillator.  Avoid carrying your cellular phone near your device. - When traveling through airports, show security personnel your identification card to avoid being screened in the metal detectors.  Ask the security personnel to use the hand wand. - Avoid arc welding equipment, MRI testing (magnetic resonance imaging), TENS units (transcutaneous nerve stimulators).  Call the office for questions about other devices. - Avoid electrical appliances that are in poor condition or are not properly grounded. - Microwave ovens are safe to be near or to operate.  Additional information for defibrillator patients should your device go off: - If your device goes off ONCE and you feel fine afterward, notify the device clinic nurses. - If your device goes off ONCE and you do not feel well afterward, call 911. - If your device goes off TWICE, call  911. - If your device goes off THREE times in one day, call 911.  DO NOT DRIVE YOURSELF OR A FAMILY MEMBER WITH A DEFIBRILLATOR TO THE HOSPITAL--CALL 911.

## 2014-08-16 ENCOUNTER — Encounter: Payer: Self-pay | Admitting: *Deleted

## 2014-08-18 ENCOUNTER — Other Ambulatory Visit: Payer: Self-pay | Admitting: Internal Medicine

## 2014-08-19 ENCOUNTER — Encounter (HOSPITAL_COMMUNITY): Payer: Self-pay | Admitting: Cardiovascular Disease

## 2014-08-25 ENCOUNTER — Encounter: Payer: Self-pay | Admitting: Internal Medicine

## 2014-08-25 ENCOUNTER — Ambulatory Visit (INDEPENDENT_AMBULATORY_CARE_PROVIDER_SITE_OTHER): Payer: Medicare Other | Admitting: Internal Medicine

## 2014-08-25 ENCOUNTER — Ambulatory Visit (INDEPENDENT_AMBULATORY_CARE_PROVIDER_SITE_OTHER): Payer: Medicare Other | Admitting: *Deleted

## 2014-08-25 VITALS — Ht 68.0 in | Wt 143.4 lb

## 2014-08-25 DIAGNOSIS — I5022 Chronic systolic (congestive) heart failure: Secondary | ICD-10-CM

## 2014-08-25 DIAGNOSIS — I429 Cardiomyopathy, unspecified: Secondary | ICD-10-CM

## 2014-08-25 LAB — MDC_IDC_ENUM_SESS_TYPE_INCLINIC
Battery Voltage: 3.1 V
Brady Statistic AP VP Percent: 46.17 %
Brady Statistic AP VS Percent: 0.78 %
Brady Statistic RA Percent Paced: 46.95 %
HIGH POWER IMPEDANCE MEASURED VALUE: 171 Ohm
HIGH POWER IMPEDANCE MEASURED VALUE: 51 Ohm
Lead Channel Impedance Value: 380 Ohm
Lead Channel Pacing Threshold Amplitude: 0.5 V
Lead Channel Pacing Threshold Amplitude: 0.875 V
Lead Channel Pacing Threshold Pulse Width: 0.4 ms
Lead Channel Pacing Threshold Pulse Width: 0.4 ms
Lead Channel Pacing Threshold Pulse Width: 0.4 ms
Lead Channel Sensing Intrinsic Amplitude: 1.25 mV
Lead Channel Sensing Intrinsic Amplitude: 1.625 mV
Lead Channel Sensing Intrinsic Amplitude: 21.625 mV
Lead Channel Sensing Intrinsic Amplitude: 25.875 mV
Lead Channel Setting Pacing Amplitude: 2 V
Lead Channel Setting Pacing Amplitude: 3.5 V
Lead Channel Setting Pacing Pulse Width: 0.4 ms
Lead Channel Setting Sensing Sensitivity: 0.3 mV
MDC IDC MSMT BATTERY REMAINING LONGEVITY: 93 mo
MDC IDC MSMT LEADCHNL RA IMPEDANCE VALUE: 380 Ohm
MDC IDC MSMT LEADCHNL RV PACING THRESHOLD AMPLITUDE: 0.75 V
MDC IDC SESS DTM: 20151216095214
MDC IDC SET LEADCHNL LV PACING PULSEWIDTH: 0.4 ms
MDC IDC SET LEADCHNL RV PACING AMPLITUDE: 3.5 V
MDC IDC SET ZONE DETECTION INTERVAL: 300 ms
MDC IDC SET ZONE DETECTION INTERVAL: 350 ms
MDC IDC STAT BRADY AS VP PERCENT: 52.26 %
MDC IDC STAT BRADY AS VS PERCENT: 0.79 %
MDC IDC STAT BRADY RV PERCENT PACED: 97.78 %
Zone Setting Detection Interval: 350 ms
Zone Setting Detection Interval: 360 ms

## 2014-08-25 NOTE — Progress Notes (Deleted)
Wound check appointment. Steri-strips removed. Wound without redness but does have considerable edema.  Incision edges approximated, wound well healed. Normal device function. Thresholds, sensing, and impedances consistent with implant measurements. Device programmed at 3.5V for extra safety margin until 3 month visit. Histogram distribution appropriate for patient and level of activity. No mode switches or ventricular arrhythmias noted. Patient educated about wound care, arm mobility, lifting restrictions, shock plan. ROV in 3 months with implanting physician.  He will also return later today for Dr. Johney Frame to evaluate his site.

## 2014-08-25 NOTE — Progress Notes (Signed)
Wound check appointment. Steri-strips removed. Wound without redness but does have considerable edema.  Incision edges approximated, wound well healed. Normal device function. Thresholds, sensing, and impedances consistent with implant measurements.  Device programmed DDDR today.   Device programmed at 3.5V for extra safety margin until 3 month visit. Histogram distribution appropriate for patient and level of activity. No mode switches.  1 NSVT episode. Patient educated about wound care, arm mobility, lifting restrictions, shock plan. ROV in 3 months with implanting physician.  He will also return later today for Dr. Johney Frame to evaluate his site.

## 2014-08-25 NOTE — Patient Instructions (Signed)
Your physician recommends that you schedule a follow-up appointment as scheduled  

## 2014-08-25 NOTE — Progress Notes (Signed)
Pt presents to have evaluation of pacemaker pocket hematoma. He has a small to moderate pocket hematoma without evidence of active bleeding. No drainage/ discharge and incision is healed.  No warmth/ redness/ pain.  Supportive care advised No indication for evacuation or other intervention at this time  Return as scheduled in 3 months

## 2014-08-31 ENCOUNTER — Other Ambulatory Visit: Payer: Self-pay | Admitting: Internal Medicine

## 2014-09-06 ENCOUNTER — Telehealth: Payer: Self-pay | Admitting: Cardiology

## 2014-09-06 NOTE — Telephone Encounter (Signed)
Pt called stated that he heard a noise from his device on Sunday 12-27 and again today. Walked pt through transmission. Informed him that transmission was received a device tech will review and we will call him back later. Pt verbalized understanding.

## 2014-09-08 NOTE — Telephone Encounter (Signed)
Device tech spoke w/ pt on 09-06-14 about transmission.

## 2014-09-12 ENCOUNTER — Encounter (HOSPITAL_COMMUNITY): Payer: Self-pay | Admitting: *Deleted

## 2014-11-04 ENCOUNTER — Ambulatory Visit: Payer: Self-pay | Admitting: Cardiology

## 2014-11-15 ENCOUNTER — Encounter: Payer: Self-pay | Admitting: Internal Medicine

## 2014-11-16 ENCOUNTER — Encounter: Payer: Self-pay | Admitting: Cardiology

## 2014-11-17 ENCOUNTER — Encounter: Payer: Self-pay | Admitting: Internal Medicine

## 2014-11-22 ENCOUNTER — Encounter: Payer: Self-pay | Admitting: Cardiology

## 2014-11-22 ENCOUNTER — Ambulatory Visit (INDEPENDENT_AMBULATORY_CARE_PROVIDER_SITE_OTHER): Payer: Medicare Other | Admitting: Cardiology

## 2014-11-22 VITALS — BP 152/84 | HR 82 | Ht 68.5 in | Wt 149.4 lb

## 2014-11-22 DIAGNOSIS — I2510 Atherosclerotic heart disease of native coronary artery without angina pectoris: Secondary | ICD-10-CM

## 2014-11-22 DIAGNOSIS — I10 Essential (primary) hypertension: Secondary | ICD-10-CM

## 2014-11-22 DIAGNOSIS — I5022 Chronic systolic (congestive) heart failure: Secondary | ICD-10-CM

## 2014-11-22 NOTE — Progress Notes (Signed)
[name] Date of Birth: 02-Jul-1941 Medical Record #[id number]  History of Present Illness: [name] is seen back today for follow up of CHF. He has multiple medical issues which include HTN,  HLD and known CAD. He has a history of tobacco abuse. He has LV dysfunction with ejection fraction of 40-45% in April 2013.  Not able to take higher beta blocker dose due to his resting bradycardia.  He had a Myoview study in March of 2013 which showed normal perfusion. Ejection fraction of 40%. He was admitted to [facility] in December 2014 with chest pain. Cardiac cath showed patent RCA stent. There was a 50% mid LAD lesion. The first diagonal and first OM were occluded. Attempted PCI of the OM but unable to cross with wire. Review of old films here showed that this was a chronic obstruction. He has a history of NSVT. He was admitted in late August with VDRF with acute pulmonary edema. Echo showed drop in EF to 20-25%. In September he was admitted with syncope associated with bradycardia and overdiuresis. Diruetics were reduced and coreg was held. Repeat Echo in November showed EF of 30%. He did undergo ICD/BiV implant on December 3 by Dr. Johney Frame.  On follow up today he complains of a lot of HA. He doesn't think his BP medication is working well. He is quite confused about his medication. He was upset with the pharmacy for not filling his lisinopril but we had stopped this a year ago and switched him to losartan. He also has several different inhalers listed but states his insurance will not pay for them and he got a letter requesting he switch to another brand. He did not bring this letter. He does not have swelling or SOB today. He admits he is having a lot of domestic and financial stress.   Current Outpatient Prescriptions on File Prior to Visit  Medication Sig Dispense Refill  . ADVAIR DISKUS 250-50 MCG/DOSE AEPB INHALE 1 PUFF INTO THE LUNGS 2 (TWO) TIMES DAILY. 60 each 3  . albuterol  (PROVENTIL HFA;VENTOLIN HFA) 108 (90 BASE) MCG/ACT inhaler Inhale 1 puff into the lungs every 6 (six) hours as needed for wheezing or shortness of breath. 1 Inhaler 6  . aspirin EC 81 MG tablet Take 1 tablet (81 mg total) by mouth daily. 150 tablet 2  . carvedilol (COREG) 3.125 MG tablet Take 1 tablet (3.125 mg total) by mouth 2 (two) times daily with a meal. 60 tablet 11  . clopidogrel (PLAVIX) 75 MG tablet Take 75 mg by mouth daily.    . Fluticasone-Salmeterol (ADVAIR) 250-50 MCG/DOSE AEPB Inhale 1 puff into the lungs 2 (two) times daily.    . furosemide (LASIX) 40 MG tablet Take 20 mg by mouth daily.     . isosorbide mononitrate (IMDUR) 30 MG 24 hr tablet Take 30 mg by mouth daily.   11  . losartan (COZAAR) 100 MG tablet Take 1 tablet (100 mg total) by mouth daily. 30 tablet 5  . nitroGLYCERIN (NITROSTAT) 0.4 MG SL tablet Place 1 tablet (0.4 mg total) under the tongue every 5 (five) minutes as needed for chest pain. (Patient taking differently: Place 0.4 mg under the tongue every 5 (five) minutes as needed for chest pain (MAX 3 TABLETS). ) 30 tablet 0  . potassium chloride (K-DUR,KLOR-CON) 20 MEQ tablet Take 2 tablets (40 mEq total) by mouth daily. 60 tablet 11   No current facility-administered medications on file prior to visit.  Allergies  Allergen Reactions  . Acyclovir And Related   . Aspirin Other (See Comments)    GI upset at high doses only.    Past Medical History  Diagnosis Date  . Dyslipidemia     a. on statin  . LBBB (left bundle branch block)   . CAD (coronary artery disease)     a. s/p stent RCA DES 9/09; b. 2014 Attempted PCI of OM1 @ [place];  c. 04/2014 Cath: LAD 40-50p, D1 95-99 (chronic), LCX 30-40 inf branch, OM1 CTO, RCA 30-40p, RCA patent stent, EF 35%->Med Rx.  . HTN (hypertension)     a. Reports intolerance to hydralazine; b. no beta blockers 2/2 bradycardia;  c. failed on ACE and ARB.  [name] Noncompliance   . Chronic combined systolic and diastolic CHF  (congestive heart failure)     a. EF about 40 to 45% per echo in April 2013;  b. 04/2014 Echo: EF 20-25%, sev LVH, sev glob HK, Gr 1 DD, mildly reduced RV fxn, PASP .  [name] LVH (left ventricular hypertrophy)   . COPD (chronic obstructive pulmonary disease) dx 06/2013    PFTs 07/08/13: mod obst with resp to bronchodilator, moderate decrease diffusion, airtrapping  . Chronic low back pain   . Mixed Ischemic/Non-ischemic Cardiomyopathy     a. 04/2014 Echo: EF 20-25%, sev glob HK.  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 NSVT (nonsustained ventricular tachycardia)   . HLD (hyperlipidemia)     Past Surgical History  Procedure Laterality Date  . Cardiac catheterization      ejection fraction 50%  . Left heart catheterization with coronary angiogram N/A 05/08/2014    Procedure: LEFT HEART CATHETERIZATION WITH CORONARY ANGIOGRAM;  Surgeon: Micheline Chapman, MD;  Location: [facility] CATH LAB;  Service: Cardiovascular;  Laterality: N/A;  . Bi-ventricular implantable cardioverter defibrillator N/A 08/12/2014    MDT Ovidio Kin XT CRTD implanted by Dr Johney Frame    History  Smoking status  . Former Smoker -- 0.50 packs/day for 56 years  . Types: Cigarettes  . Quit date: 09/10/2012  Smokeless tobacco  . Not on file    History  Alcohol Use No    Family History  Problem Relation Age of Onset  . Heart attack Neg Hx   . Stroke Neg Hx     Review of Systems: As noted in history of present illness  All other systems were reviewed and are negative.  Physical Exam: BP 152/84 mmHg  Pulse 82  Ht 5' 8.5" (1.74 m)  Wt 149 lb 6.4 oz (67.767 kg)  BMI 22.38 kg/m2 Patient is pleasant and in no acute distress. Skin is warm and dry. Color is normal.  HEENT is unremarkable. Normocephalic/atraumatic. PERRL. Sclera are nonicteric. Neck is supple. No masses. No JVD. Lungs are clear. Cardiac exam shows a regular rate and rhythm. Normal S1-2. No gallop or murmur. ICD site is healing well. Abdomen is soft.  Extremities are without edema. Gait and ROM are intact. No gross neurologic deficits noted.   LABORATORY DATA:   Lab Results  Component Value Date   WBC 4.8 08/06/2014   HGB 11.2* 08/06/2014   HCT 33.6* 08/06/2014   PLT 171 08/06/2014   GLUCOSE 109* 08/13/2014   CHOL 208* 06/18/2013   TRIG 71 05/08/2014   HDL 54.20 06/18/2013   LDLDIRECT 132.9 06/18/2013   LDLCALC 68 11/12/2011   ALT 10 06/20/2014   AST 20 06/20/2014   NA 141 08/13/2014   K 3.9 08/13/2014  CL 104 08/13/2014   CREATININE 0.77 08/13/2014   BUN 9 08/13/2014   CO2 24 08/13/2014   TSH 3.01 06/18/2013   INR 1.09 06/20/2014     Assessment / Plan: 1. Chronic systolic CHF with mixed cardiomyopathy. Last EF 30%. Volume status is good. Continue current lasix,  losartan. Coreg dose increased to 6.25 mg bid now that we don't have to worry about bradycardia with BiV pacing.  Anticipate repeating an Echo at some point but for now we need to get his medications straight. I explained that he is no longer on lisinopril.   2. Coronary disease with remote stenting of the right coronary in 2009 with a drug-eluting stent. Chronic occlusion of OM. He is asymptomatic. Myoview study 2013 showed no ischemia.  3. Tobacco abuse. No longer smoking  4. Hyperlipidemia  on Lipitor.   5. COPD controlled on inhaler therapy- requested a copy of his insurance letter so we can hopefully prescribe an inhaler that is covered.   6. HTN poorly controlled. We will see how he responds to higher carvedilol dose. May need to increase further or add hydralazine.   7. NSVT  8. S/p ICD/BiV pacemaker.  I will follow up in 2 months.

## 2014-11-22 NOTE — Patient Instructions (Signed)
We will increase carvedilol to 6.25 mg twice a day.  Continue your other cardiac meds.   Let us know which inhalers your plan will cover and we will work with you on this.  Continue to monitor your blood pressure and let us know if it is running high.  I will see you in 2 months.

## 2014-11-24 ENCOUNTER — Other Ambulatory Visit: Payer: Self-pay

## 2014-11-26 ENCOUNTER — Telehealth: Payer: Self-pay

## 2014-11-26 MED ORDER — FLUTICASONE-SALMETEROL 250-50 MCG/DOSE IN AEPB: 1.0000 | INHALATION_SPRAY | Freq: Two times a day (BID) | RESPIRATORY_TRACT | Status: DC

## 2014-11-26 MED ORDER — UMECLIDINIUM BROMIDE 62.5 MCG/INH IN AEPB: 1.0000 | INHALATION_SPRAY | Freq: Every day | RESPIRATORY_TRACT | Status: DC

## 2014-11-26 MED ORDER — ALBUTEROL SULFATE HFA 108 (90 BASE) MCG/ACT IN AERS: 1.0000 | INHALATION_SPRAY | Freq: Four times a day (QID) | RESPIRATORY_TRACT | Status: DC | PRN

## 2014-11-26 MED ORDER — CARVEDILOL 6.25 MG PO TABS: 6.2500 mg | ORAL_TABLET | Freq: Two times a day (BID) | ORAL | Status: DC

## 2014-11-26 NOTE — Telephone Encounter (Signed)
Not able to leave message about flu vaccine, phone number incorrect

## 2014-11-26 NOTE — Telephone Encounter (Signed)
Spoke to patient received your [employer] prescription coverage for inhalers.Inhaler prescriptions sent to pharmacy.Patient stated he would like a [employer] parking pass.Advised Dr.Jordan out of office will have him sign next week and mail to you.

## 2014-12-06 ENCOUNTER — Telehealth: Payer: Self-pay

## 2014-12-06 MED ORDER — METOPROLOL TARTRATE 25 MG PO TABS: 25.0000 mg | ORAL_TABLET | Freq: Two times a day (BID) | ORAL | Status: DC

## 2014-12-06 NOTE — Telephone Encounter (Signed)
Returned call to patient spoke to Dr.Jordan he advised to stop carvedilol and start metoprolol 25 mg twice a day.Advised to see PCP about changing inhalers.Stated he scheduled appointment with Dr.Hopper 12/07/14.

## 2014-12-06 NOTE — Telephone Encounter (Signed)
Received a call from patient he stated he has been wheezing and sob this past weekend.Stated ventolin and incruse inhalers not helping.Advised he needs to see PCP.Dr.Jordan out of office this morning.I will let him know.

## 2014-12-07 ENCOUNTER — Encounter: Payer: Self-pay | Admitting: Internal Medicine

## 2014-12-07 ENCOUNTER — Ambulatory Visit (INDEPENDENT_AMBULATORY_CARE_PROVIDER_SITE_OTHER): Payer: Medicare Other | Admitting: Internal Medicine

## 2014-12-07 VITALS — BP 178/88 | HR 64 | Temp 98.3°F | Resp 17 | Ht 69.0 in | Wt 147.0 lb

## 2014-12-07 DIAGNOSIS — J449 Chronic obstructive pulmonary disease, unspecified: Secondary | ICD-10-CM | POA: Diagnosis not present

## 2014-12-07 DIAGNOSIS — Z789 Other specified health status: Secondary | ICD-10-CM | POA: Diagnosis not present

## 2014-12-07 DIAGNOSIS — I5022 Chronic systolic (congestive) heart failure: Secondary | ICD-10-CM

## 2014-12-07 DIAGNOSIS — I2510 Atherosclerotic heart disease of native coronary artery without angina pectoris: Secondary | ICD-10-CM | POA: Diagnosis not present

## 2014-12-07 DIAGNOSIS — Z9189 Other specified personal risk factors, not elsewhere classified: Secondary | ICD-10-CM

## 2014-12-07 MED ORDER — PREDNISONE 20 MG PO TABS: 20.0000 mg | ORAL_TABLET | Freq: Two times a day (BID) | ORAL | Status: DC

## 2014-12-07 NOTE — Progress Notes (Signed)
Pre visit review using our clinic review tool, if applicable. No additional management support is needed unless otherwise documented below in the visit note. 

## 2014-12-07 NOTE — Patient Instructions (Signed)
Albuterol is a rescue inhaler which should be used as infrequently as possible; it should never be used more than 1-2 puffs every 4 hours.    If it is required more than 2-3 times per week; the asthma is not well controlled. Symbicort , Dulera , Advair or other maintenance agent should be considered to control smooth muscle spasm and airway inflammation  and to prevent adverse effects from excess albuterol use.Those adverse effects can include health or life threatening heart rhythm irregularities.   Please take all your inhalers to your appointment with the Pulmonologist. Use Dulera 2 puffs every 12 hours until seen.

## 2014-12-07 NOTE — Progress Notes (Signed)
   Subjective:    Patient ID: [name], male    DOB: 09-28-1940, 74 y.o.   MRN: [id number]  HPI  He describes wheezing or shortness of breath for last 2 weeks. He also has paroxysmal nocturnal dyspnea every 2-6 hours. He has been sleeping with 2 pillows. He has severe exertional dyspnea after 3-4 minutes of ambulation.   He's been using albuterol 2 puffs 2-3 times per day in addition to concomitant use of Advair and Dulera.   He has malignant hypertension but has not taken his blood pressure medication today. He had been on carvedilol 6.25 mg twice a day but this was switched to Lopressor yesterday. He has not picked up the Lopressor.    He was hospitalized last September with a" heart attack"; he was in respiratory failure. He has not seen his Pulmonologist since March of last year.  The EF has been 20-25 % on ECHO.  The acute episodes of shortness of breath have been associated with some chest pain. He is not having chest pain at this time. He also has palpitations associated with wheezing. He is is lightheaded headache with these dyspneic episodes as well.   Review of Systems  He denies fever, chills, sweats.  His cough is nonproductive.    At this time he is not having palpitations, chest pain, claudication, or edema.  Frontal headache, facial pain , nasal purulence, dental pain, sore throat , otic pain or otic discharge denied.      Objective:   Physical Exam   Pertinent or positive findings include: His  head is shaven; he has a mustache.   Dense arcus senilis is present. He has extremely poor dentition with multiple caries with erosions to and below the gumline.  Breath sounds are generally decreased yet he has diffuse high-pitched wheezing in all lung fields.  There is splitting of the second heart sound.  Clubbing of the nailbeds is present. Decreased pedal pulses except L PTP.  General appearance :Thin but adequately nourished; in no distress despite history as  noted. Eyes: No conjunctival inflammation or scleral icterus is present. Oral exam:  Lips and gums are healthy appearing.There is no oropharyngeal erythema or exudate noted.  Heart:  Normal rate and regular rhythm. S1 normal without gallop, murmur, click, rub or other extra sounds  Abdomen: bowel sounds normal, soft and non-tender without masses, organomegaly or hernias noted.  No guarding or rebound.  Vascular : all pulses equal ; no bruits present. Skin:Warm & dry.  Intact without suspicious lesions or rashes ; no tenting  Lymphatic: No lymphadenopathy is noted about the head, neck, axilla Neuro: Strength, tone  normal.        Assessment & Plan:   #1 COPD with asthma component. Ongoing monitor by Pulmonary/CC is necessary to prevent adverse drug:drug effects as well as to manage his advanced lung disease  #2 malignant hypertension; as noted he has not taken his blood pressure pills today   #3 excess albuterol use and duplication of maintenance bronchodilator therapy.  Plan: See orders and after visit summary.

## 2014-12-18 ENCOUNTER — Other Ambulatory Visit: Payer: Self-pay | Admitting: Internal Medicine

## 2014-12-20 ENCOUNTER — Telehealth: Payer: Self-pay | Admitting: Internal Medicine

## 2014-12-20 ENCOUNTER — Other Ambulatory Visit: Payer: Self-pay | Admitting: Internal Medicine

## 2014-12-20 DIAGNOSIS — J449 Chronic obstructive pulmonary disease, unspecified: Secondary | ICD-10-CM

## 2014-12-20 MED ORDER — PREDNISONE 20 MG PO TABS: 20.0000 mg | ORAL_TABLET | Freq: Two times a day (BID) | ORAL | Status: DC

## 2014-12-20 NOTE — Telephone Encounter (Signed)
No;he needs F/U by Pulmonary ASAP

## 2014-12-20 NOTE — Telephone Encounter (Signed)
Patient is requesting a refill of predniSONE (DELTASONE) 20 MG tablet [[id number]] sent to [facility] on [address]

## 2014-12-20 NOTE — Telephone Encounter (Signed)
Phone call to patient. Advised he will be hearing from [facility] regarding appointment to pulmonary. Per Dr Alwyn Ren #14 bid of prednisone has [facility] [address]

## 2014-12-20 NOTE — Telephone Encounter (Signed)
Dr Alwyn Ren is this to be refilled?

## 2014-12-24 NOTE — Telephone Encounter (Signed)
Throat pain & difficulty swallowing warrants ER

## 2014-12-24 NOTE — Telephone Encounter (Signed)
Left patient a detailed message

## 2014-12-24 NOTE — Telephone Encounter (Signed)
Patient is now having sharp pains in his throat and can't swallow right. Should I schedule him to back in with [place]?

## 2015-01-10 ENCOUNTER — Encounter: Payer: Self-pay | Admitting: Internal Medicine

## 2015-01-12 ENCOUNTER — Other Ambulatory Visit: Payer: Self-pay | Admitting: Cardiology

## 2015-01-24 ENCOUNTER — Encounter: Payer: Self-pay | Admitting: Cardiology

## 2015-01-24 ENCOUNTER — Ambulatory Visit (INDEPENDENT_AMBULATORY_CARE_PROVIDER_SITE_OTHER): Payer: Medicare Other | Admitting: Cardiology

## 2015-01-24 VITALS — BP 170/60 | HR 70 | Ht 68.0 in | Wt 151.6 lb

## 2015-01-24 DIAGNOSIS — J449 Chronic obstructive pulmonary disease, unspecified: Secondary | ICD-10-CM

## 2015-01-24 DIAGNOSIS — I2510 Atherosclerotic heart disease of native coronary artery without angina pectoris: Secondary | ICD-10-CM

## 2015-01-24 DIAGNOSIS — I447 Left bundle-branch block, unspecified: Secondary | ICD-10-CM

## 2015-01-24 DIAGNOSIS — I5022 Chronic systolic (congestive) heart failure: Secondary | ICD-10-CM

## 2015-01-24 NOTE — Patient Instructions (Signed)
Continue your current cardiac therapy  Keep your follow up with Dr. Kendrick Fries  I will see you in 6 months.

## 2015-01-24 NOTE — Progress Notes (Signed)
[name] Date of Birth: [birth year] Medical Record #[id number]  History of Present Illness: [name] is seen back today for follow up of CHF. He has multiple medical issues which include HTN,  HLD and known CAD. He has a history of tobacco abuse. He has LV dysfunction with ejection fraction of 40-45% in April 2013.  Not able to take higher beta blocker dose due to his resting bradycardia.  He had a Myoview study in March of 2013 which showed normal perfusion. Ejection fraction of 40%. He was admitted to [facility] in December 2014 with chest pain. Cardiac cath showed patent RCA stent. There was a 50% mid LAD lesion. The first diagonal and first OM were occluded. Attempted PCI of the OM but unable to cross with wire. Review of old films here showed that this was a chronic obstruction. He has a history of NSVT. He was admitted in late August with VDRF with acute pulmonary edema. Echo showed drop in EF to 20-25%. In September he was admitted with syncope associated with bradycardia and overdiuresis. Diruetics were reduced and coreg was held. Repeat Echo in November showed EF of 30%. He did undergo ICD/BiV implant on December 3 by Dr. Johney Frame.  On follow up today he is having a lot of trouble with his breathing with a lot of wheezing.  He still has multiple inhalers listed and it is unclear how he is taking them this includes Dulera, Advair, and Flovent. He has been given 2 courses of prednisone without improvement. He is scheduled to see pulmonary soon. No chest pain. No increase in edema.  Current Outpatient Prescriptions on File Prior to Visit  Medication Sig Dispense Refill  . albuterol (PROVENTIL HFA;VENTOLIN HFA) 108 (90 BASE) MCG/ACT inhaler Inhale 1 puff into the lungs every 6 (six) hours as needed for wheezing or shortness of breath. 1 Inhaler 6  . aspirin EC 81 MG tablet Take 1 tablet (81 mg total) by mouth daily. 150 tablet 2  . clopidogrel (PLAVIX) 75 MG tablet TAKE 1 TABLET BY  MOUTH EVERY DAY 90 tablet 0  . fluticasone (FLOVENT HFA) 110 MCG/ACT inhaler Inhale 2 puffs into the lungs as needed.    . Fluticasone-Salmeterol (ADVAIR) 250-50 MCG/DOSE AEPB Inhale 1 puff into the lungs 2 (two) times daily. 60 each 6  . furosemide (LASIX) 40 MG tablet Take 20 mg by mouth daily.     [name] losartan (COZAAR) 100 MG tablet Take 1 tablet (100 mg total) by mouth daily. 30 tablet 5  . metoprolol tartrate (LOPRESSOR) 25 MG tablet Take 1 tablet (25 mg total) by mouth 2 (two) times daily. 60 tablet 6  . mometasone-formoterol (DULERA) 100-5 MCG/ACT AERO Inhale 2 puffs into the lungs as needed for wheezing.    . potassium chloride (K-DUR,KLOR-CON) 20 MEQ tablet Take 2 tablets (40 mEq total) by mouth daily. 60 tablet 11  . predniSONE (DELTASONE) 20 MG tablet Take 1 tablet (20 mg total) by mouth 2 (two) times daily. 14 tablet 0  . Umeclidinium Bromide (INCRUSE ELLIPTA) 62.5 MCG/INH AEPB Inhale 1 puff into the lungs daily. Take at same time each day 30 each 6  . nitroGLYCERIN (NITROSTAT) 0.4 MG SL tablet Place 1 tablet (0.4 mg total) under the tongue every 5 (five) minutes as needed for chest pain. (Patient taking differently: Place 0.4 mg under the tongue every 5 (five) minutes as needed for chest pain (MAX 3 TABLETS). ) 30 tablet 0   No current facility-administered medications on file  prior to visit.    Allergies  Allergen Reactions  . Acyclovir And Related   . Aspirin Other (See Comments)    GI upset at high doses only.    Past Medical History  Diagnosis Date  . Dyslipidemia     a. on statin  . LBBB (left bundle branch block)   . CAD (coronary artery disease)     a. s/p stent RCA DES 9/09; b. 2014 Attempted PCI of OM1 @ [place];  c. 04/2014 Cath: LAD 40-50p, D1 95-99 (chronic), LCX 30-40 inf branch, OM1 CTO, RCA 30-40p, RCA patent stent, EF 35%->Med Rx.  . HTN (hypertension)     a. Reports intolerance to hydralazine; b. no beta blockers 2/2 bradycardia;  c. failed on ACE and ARB.   [name] Noncompliance   . Chronic combined systolic and diastolic CHF (congestive heart failure)     a. EF about 40 to 45% per echo in April 2013;  b. 04/2014 Echo: EF 20-25%, sev LVH, sev glob HK, Gr 1 DD, mildly reduced RV fxn, PASP .  [name] LVH (left ventricular hypertrophy)   . COPD (chronic obstructive pulmonary disease) dx 06/2013    PFTs 07/08/13: mod obst with resp to bronchodilator, moderate decrease diffusion, airtrapping  . Chronic low back pain   . Mixed Ischemic/Non-ischemic Cardiomyopathy     a. 04/2014 Echo: EF 20-25%, sev glob HK.  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 NSVT (nonsustained ventricular tachycardia)   . HLD (hyperlipidemia)     Past Surgical History  Procedure Laterality Date  . Cardiac catheterization      ejection fraction 50%  . Left heart catheterization with coronary angiogram N/A 05/08/2014    Procedure: LEFT HEART CATHETERIZATION WITH CORONARY ANGIOGRAM;  Surgeon: Micheline Chapman, MD;  Location: [facility] CATH LAB;  Service: Cardiovascular;  Laterality: N/A;  . Bi-ventricular implantable cardioverter defibrillator N/A 08/12/2014    MDT Ovidio Kin XT CRTD implanted by Dr Johney Frame    History  Smoking status  . Former Smoker -- 0.50 packs/day for 56 years  . Types: Cigarettes  . Quit date: 09/10/2012  Smokeless tobacco  . Not on file    History  Alcohol Use No    Family History  Problem Relation Age of Onset  . Heart attack Neg Hx   . Stroke Neg Hx     Review of Systems: As noted in history of present illness  All other systems were reviewed and are negative.  Physical Exam: BP 170/60 mmHg  Pulse 70  Ht 5\' 8"  (1.727 m)  Wt 151 lb 9.6 oz (68.765 kg)  BMI 23.06 kg/m2 Patient is pleasant and in no acute distress. Skin is warm and dry. Color is normal.  HEENT is unremarkable. Normocephalic/atraumatic. PERRL. Sclera are nonicteric. Neck is supple. No masses. No JVD. Lungs reveal diffuse inspiratory and expiratory wheezes.  Cardiac exam shows a  regular rate and rhythm. Normal S1-2. No gallop or murmur. ICD site is healing well. Abdomen is soft. Extremities are without edema. Gait and ROM are intact. No gross neurologic deficits noted.   LABORATORY DATA:   Lab Results  Component Value Date   WBC 4.8 08/06/2014   HGB 11.2* 08/06/2014   HCT 33.6* 08/06/2014   PLT 171 08/06/2014   GLUCOSE 109* 08/13/2014   CHOL 208* 06/18/2013   TRIG 71 05/08/2014   HDL 54.20 06/18/2013   LDLDIRECT 132.9 06/18/2013   LDLCALC 68 11/12/2011   ALT 10 06/20/2014   AST 20 06/20/2014  NA 141 08/13/2014   K 3.9 08/13/2014   CL 104 08/13/2014   CREATININE 0.77 08/13/2014   BUN 9 08/13/2014   CO2 24 08/13/2014   TSH 3.01 06/18/2013   INR 1.09 06/20/2014     Assessment / Plan: 1. Chronic systolic CHF with mixed cardiomyopathy. Last EF 30%. Volume status looks good. Continue current lasix,  Losartan, lopressor.  Anticipate repeating an Echo at some point but for now we need to get his medications straight.   2. Coronary disease with remote stenting of the right coronary in 2009 with a drug-eluting stent. Chronic occlusion of OM. He is asymptomatic. Myoview study 2013 showed no ischemia.  3. Tobacco abuse. No longer smoking  4. Hyperlipidemia  on Lipitor.   5. COPD with asthma. Symptoms are not controlled. Needs to follow up with pulmonary. Need to simplify inhaler therapy as it appears he is on duplicate inhalers.   6. HTN exacerbated by recent steroids. Will continue Rx for now.   7. NSVT  8. S/p ICD/BiV pacemaker.

## 2015-01-26 ENCOUNTER — Ambulatory Visit (INDEPENDENT_AMBULATORY_CARE_PROVIDER_SITE_OTHER)
Admission: RE | Admit: 2015-01-26 | Discharge: 2015-01-26 | Disposition: A | Discharge status: Home / Self Care | Payer: Medicare Other | Source: Ambulatory Visit | Attending: Internal Medicine | Admitting: Internal Medicine

## 2015-01-26 ENCOUNTER — Encounter: Payer: Self-pay | Admitting: Internal Medicine

## 2015-01-26 ENCOUNTER — Encounter: Payer: Self-pay | Admitting: *Deleted

## 2015-01-26 ENCOUNTER — Other Ambulatory Visit (INDEPENDENT_AMBULATORY_CARE_PROVIDER_SITE_OTHER): Payer: Medicare Other

## 2015-01-26 ENCOUNTER — Ambulatory Visit (INDEPENDENT_AMBULATORY_CARE_PROVIDER_SITE_OTHER): Payer: Medicare Other | Admitting: Internal Medicine

## 2015-01-26 VITALS — BP 146/92 | HR 61 | Ht 68.5 in | Wt 151.8 lb

## 2015-01-26 DIAGNOSIS — J449 Chronic obstructive pulmonary disease, unspecified: Secondary | ICD-10-CM | POA: Diagnosis not present

## 2015-01-26 DIAGNOSIS — R0600 Dyspnea, unspecified: Secondary | ICD-10-CM

## 2015-01-26 DIAGNOSIS — I10 Essential (primary) hypertension: Secondary | ICD-10-CM | POA: Diagnosis not present

## 2015-01-26 DIAGNOSIS — I2510 Atherosclerotic heart disease of native coronary artery without angina pectoris: Secondary | ICD-10-CM | POA: Diagnosis not present

## 2015-01-26 LAB — BASIC METABOLIC PANEL
BUN: 10 mg/dL (ref 6–23)
CO2: 28 mEq/L (ref 19–32)
Calcium: 9.6 mg/dL (ref 8.4–10.5)
Chloride: 109 mEq/L (ref 96–112)
Creatinine, Ser: 0.79 mg/dL (ref 0.40–1.50)
GFR: 124.2 mL/min (ref >60.00)
GLUCOSE: 87 mg/dL (ref 70–99)
Potassium: 4.6 mEq/L (ref 3.5–5.1)
Sodium: 143 mEq/L (ref 135–145)

## 2015-01-26 LAB — CBC WITH DIFFERENTIAL/PLATELET
BASOS PCT: 0.6 % (ref 0.0–3.0)
Basophils Absolute: 0 10*3/uL (ref 0.0–0.1)
EOS PCT: 1.5 % (ref 0.0–5.0)
Eosinophils Absolute: 0.1 10*3/uL (ref 0.0–0.7)
HCT: 40.3 % (ref 39.0–52.0)
Hemoglobin: 13.3 g/dL (ref 13.0–17.0)
LYMPHS PCT: 55.1 % — AB (ref 12.0–46.0)
Lymphs Abs: 2.4 10*3/uL (ref 0.7–4.0)
MCHC: 33 g/dL (ref 30.0–36.0)
MCV: 84.1 fl (ref 78.0–100.0)
MONOS PCT: 9.3 % (ref 3.0–12.0)
Monocytes Absolute: 0.4 10*3/uL (ref 0.1–1.0)
NEUTROS PCT: 33.5 % — AB (ref 43.0–77.0)
Neutro Abs: 1.5 10*3/uL (ref 1.4–7.7)
PLATELETS: 140 10*3/uL — AB (ref 150.0–400.0)
RBC: 4.8 Mil/uL (ref 4.22–5.81)
RDW: 14.9 % (ref 11.5–15.5)
WBC: 4.4 10*3/uL (ref 4.0–10.5)

## 2015-01-26 LAB — TSH: TSH: 1.89 u[IU]/mL (ref 0.35–4.50)

## 2015-01-26 LAB — BRAIN NATRIURETIC PEPTIDE: PRO B NATRI PEPTIDE: 218 pg/mL — AB (ref 0.0–100.0)

## 2015-01-26 MED ORDER — OMEPRAZOLE MAGNESIUM 20 MG PO TBEC: 20.0000 mg | DELAYED_RELEASE_TABLET | Freq: Every day | ORAL | Status: DC

## 2015-01-26 MED ORDER — FAMOTIDINE 20 MG PO TABS: ORAL_TABLET | ORAL | Status: DC

## 2015-01-26 NOTE — Progress Notes (Signed)
Quick Note:  Called multiple times and msg states "number or code you have dialed is incorrect ______ 

## 2015-01-26 NOTE — Patient Instructions (Addendum)
Please remember to go to the lab and x-ray department downstairs for your tests - we will call you with the results when they are available.    Stop all your inhalers except for:  Plan A = symbicort 160 Take 2 puffs first thing in am and then another 2 puffs about 12 hours later.   Plan B = Backup  - Only use your albuterol (ventolin) as a rescue medication to be used if you can't catch your breath by resting or doing a relaxed purse lip breathing pattern.  - The less you use it, the better it will work when you need it. - Ok to use up to 2 puffs  every 4 hours if you must but call for immediate appointment if use goes up over your usual need - Don't leave home without it !!  (think of it like the spare tire for your car)   Try prilosec 20mg   Take 30-60 min before first meal of the day and Pepcid 20 mg one bedtime until cough is completely gone for at least a week without the need for cough suppression  Keep your appointment with Dr Kendrick Fries but bring all active medications with you

## 2015-01-26 NOTE — Progress Notes (Signed)
Quick Note:  Called multiple times and msg states "number or code you have dialed is incorrect ______

## 2015-01-26 NOTE — Progress Notes (Signed)
Subjective:    Patient ID: [name], male    DOB: 01/04/1941, 74 y.o.   MRN: [id number]  HPI  Mr. Flanigan is here to see me today for COPD.  He has been having shortness of breath for some time with exertion and he feel that the inhalers are not working too well. He continues to take Advair but he feels that it isn't working well.  He notices dyspnea after walking 15-20 mintues at work.  He typically has to use albuterol at this point which helps.  He works as a Electrical engineer.  He thinks that this has increased in severity since then.    He has never had lung function test. His childhood was normal without respiratory illnesses.  He has never been admitted primarily for a respiratory problem, though the chart notes that he was admitted for influenza in 08/2013.    He has previously smoked as much as a pack per day.  He states that he decreased his smoking down to 1/3 pack per day before his hospitalization in 08/2013.  Stress at work made him smoke more.  He currently smokes just a few cigarettes per day.  However he has been trying hard to quit lately.  He quit at one point in the 1960's using nicorette gum.  Unfortunately he started smoking again in 1976.   He has coronary artery disease, CHF, and history of NSVT.   He does not cough much.   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 aecopd on advair   Chief Complaint  Patient presents with  . Follow-up    Pt last seen by Dr Kendrick Fries March 2015. He c/o increased SOB and wheezing for the past 2 wks. He is using ventolin 5-6 x per day.   on advair twice daily incruse was started since breathing deteriorated x  3 weeks and no better > sob x 50 ft  No obvious day to day or daytime variabilty or assoc acute cough or cp or chest tightness,   overt sinus or hb symptoms. No unusual exp hx or  h/o childhood pna/ asthma or knowledge of premature birth.  Sleeping ok without nocturnal  or early am exacerbation  of respiratory  c/o's or need for noct saba. Also denies any obvious fluctuation of symptoms with weather or environmental changes or other aggravating or alleviating factors except as outlined above   Current Medications, Allergies, Complete Past Medical History, Past Surgical History, Family History, and Social History were reviewed in [employer] record.  ROS  The following are not active complaints unless bolded sore throat, dysphagia, dental problems, itching, sneezing,  nasal congestion or excess/ purulent secretions, ear ache,   fever, chills, sweats, unintended wt loss, pleuritic or exertional cp, hemoptysis,  orthopnea pnd or leg swelling, presyncope, palpitations, heartburn, abdominal pain, anorexia, nausea, vomiting, diarrhea  or change in bowel or urinary habits, change in stools or urine, dysuria,hematuria,  rash, arthralgias, visual complaints, headache, numbness weakness or ataxia or problems with walking or coordination,  change in mood/affect or memory.           Objective:   Physical Exam    amb bm nad/ unusual affect/ very slow responses / unsure of meds/ names/ how taken   Wt Readings from Last 3 Encounters:  01/26/15  151 lb 12.8 oz (68.856 kg)  01/24/15 151 lb 9.6 oz (68.765 kg)  12/07/14 147 lb (66.679 kg)    Vital signs reviewed   Gen: well appearing, no acute distress HEENT: NCAT, PERRL, EOMi, OP clear, neck supple without masses PULM:  Distant bs with trace wheeze bilaterally after using saba w/in one hour of ov  CV: RRR, no mgr, no JVD AB: BS+, soft, nontender, no hsm Ext: warm, no edema, no clubbing, no cyanosis Derm: no rash or skin breakdown Neuro: A&Ox4, CN II-XII intact, strength 5/5 in all 4 extremities   CXR PA and Lateral:   01/26/2015 :     I personally reviewed images and agree with radiology impression as  follows:    No radiographic evidence of acute cardiopulmonary disease with a background of chronic changes. Emphysema. Atherosclerosis.     Labs ordered/ reviewed:    Lab 01/26/15 1143  NA 143  K 4.6  CL 109  CO2 28  BUN 10  CREATININE 0.79  GLUCOSE 87    Lab 01/26/15 1143  HGB 13.3  HCT 40.3  WBC 4.4  PLT 140.0*     Lab Results  Component Value Date   TSH 1.89 01/26/2015     Lab Results  Component Value Date   PROBNP 218.0* 01/26/2015     Assessment & Plan:

## 2015-01-27 ENCOUNTER — Telehealth: Payer: Self-pay | Admitting: Internal Medicine

## 2015-01-28 NOTE — Telephone Encounter (Signed)
Unable to get back in touch with patient as he does not have a working number on file and per Cordelia Pen would not leave a good contact number to reach him back.  Will await call back

## 2015-01-29 ENCOUNTER — Encounter: Payer: Self-pay | Admitting: Internal Medicine

## 2015-01-29 NOTE — Assessment & Plan Note (Signed)
Labs reviewed, discussed under ddx of copd flare

## 2015-01-29 NOTE — Assessment & Plan Note (Signed)
07/08/13 PFT: FEV1 1.74L (66% pred, 30% change with BD), mod obst with resp to bronchodilator, moderate decrease diffusion, air-trapping 11/2013 Simple spiro>> clear obstruction, FEV1 1.30 L (47% pred)  Flare of sob/ wheezing on max dpi's with excess saba use and prev documented saba response.  DDX of  difficult airways management all start with A and  include Adherence, Ace Inhibitors, Acid Reflux, Active Sinus Disease, Alpha 1 Antitripsin deficiency, Anxiety masquerading as Airways dz,  ABPA,  allergy(esp in young), Aspiration (esp in elderly), Adverse effects of DPI,  Active smokers, plus two Bs  = Bronchiectasis and Beta blocker use..and one C= CHF  Adherence is always the initial "prime suspect" and is a multilayered concern that requires a "trust but verify" approach in every patient - starting with knowing how to use medications, especially inhalers, correctly, keeping up with refills and understanding the fundamental difference between maintenance and prns vs those medications only taken for a very short course and then stopped and not refilled.  -The proper method of use, as well as anticipated side effects, of a metered-dose inhaler are discussed and demonstrated to the patient. Improved effectiveness after extensive coaching during this visit to a level of approximately  75% try symbicort 160 2bid - seems very shaky on details of care > rec bring all active meds with him to f/u ov  ? Acid (or non-acid) GERD > always difficult to exclude as up to 75% of pts in some series report no assoc GI/ Heartburn symptoms> rec max (24h)  acid suppression and diet restrictions/ reviewed and instructions given in writing.  ? Adverse effects of dpi's > try off all dpi's  ? ACEi effects > supposed to be on generic cozar instead but noted For reasons that may related to vascular permability and nitric oxide pathways but not elevated  bradykinin levels (as seen with  ACEi use) losartan in the generic form has  been reported now from mulitple sources  to cause a similar pattern of non-specific  upper airway symptoms as seen with acei.   This has not been reported with exposure to the other ARB's to date, so it seems reasonable for now to try consider  generic diovan or avapro if ARB needed or use an alternative class altogether.  See:  Dewayne Hatch Allergy Asthma Immunol  2008: 101: p [phone]    ? CHF > have not excluded with bnp in intermediate range > see hbp   I had an extended discussion with the patient reviewing all relevant studies completed to date and  lasting 15 to 20 minutes of a 25 minute visit on the following ongoing concerns:  Each maintenance medication was reviewed in detail including most importantly the difference between maintenance and as needed and under what circumstances the prns are to be used.  Please see instructions for details which were reviewed in writing and the patient given a copy.

## 2015-01-29 NOTE — Assessment & Plan Note (Signed)
Not Adequate control on present rx, reviewed > not clear he's following complex rx   Note if increase bb needed > Strongly prefer in this setting: Bystolic, the most beta -1  selective Beta blocker available in sample form, with bisoprolol the most selective generic choice  on the market.

## 2015-01-31 ENCOUNTER — Encounter: Payer: Self-pay | Admitting: Internal Medicine

## 2015-01-31 ENCOUNTER — Ambulatory Visit (INDEPENDENT_AMBULATORY_CARE_PROVIDER_SITE_OTHER): Payer: Medicare Other | Admitting: Internal Medicine

## 2015-01-31 VITALS — BP 190/90 | HR 84 | Ht 68.5 in | Wt 154.4 lb

## 2015-01-31 DIAGNOSIS — I5022 Chronic systolic (congestive) heart failure: Secondary | ICD-10-CM

## 2015-01-31 DIAGNOSIS — I4729 Other ventricular tachycardia: Secondary | ICD-10-CM

## 2015-01-31 DIAGNOSIS — I255 Ischemic cardiomyopathy: Secondary | ICD-10-CM

## 2015-01-31 DIAGNOSIS — I472 Ventricular tachycardia: Secondary | ICD-10-CM | POA: Diagnosis not present

## 2015-01-31 DIAGNOSIS — I10 Essential (primary) hypertension: Secondary | ICD-10-CM

## 2015-01-31 LAB — CUP PACEART INCLINIC DEVICE CHECK
Battery Voltage: 3.02 V
Brady Statistic AP VP Percent: 68.83 %
Brady Statistic AP VS Percent: 1 %
Brady Statistic AS VS Percent: 0.54 %
Brady Statistic RA Percent Paced: 69.84 %
Brady Statistic RV Percent Paced: 97.38 %
Date Time Interrogation Session: 20160523154908
HIGH POWER IMPEDANCE MEASURED VALUE: 59 Ohm
HighPow Impedance: 171 Ohm
Lead Channel Impedance Value: 456 Ohm
Lead Channel Pacing Threshold Amplitude: 0.75 V
Lead Channel Pacing Threshold Amplitude: 0.75 V
Lead Channel Pacing Threshold Pulse Width: 0.4 ms
Lead Channel Pacing Threshold Pulse Width: 0.4 ms
Lead Channel Setting Pacing Amplitude: 2 V
Lead Channel Setting Pacing Amplitude: 2 V
Lead Channel Setting Pacing Pulse Width: 0.4 ms
Lead Channel Setting Pacing Pulse Width: 0.4 ms
Lead Channel Setting Sensing Sensitivity: 0.3 mV
MDC IDC MSMT BATTERY REMAINING LONGEVITY: 92 mo
MDC IDC MSMT LEADCHNL LV PACING THRESHOLD AMPLITUDE: 1 V
MDC IDC MSMT LEADCHNL RA SENSING INTR AMPL: 2.125 mV
MDC IDC MSMT LEADCHNL RV IMPEDANCE VALUE: 399 Ohm
MDC IDC MSMT LEADCHNL RV PACING THRESHOLD PULSEWIDTH: 0.4 ms
MDC IDC MSMT LEADCHNL RV SENSING INTR AMPL: 29.125 mV
MDC IDC SET LEADCHNL RA PACING AMPLITUDE: 1.5 V
MDC IDC SET ZONE DETECTION INTERVAL: 350 ms
MDC IDC STAT BRADY AS VP PERCENT: 29.62 %
Zone Setting Detection Interval: 300 ms
Zone Setting Detection Interval: 350 ms
Zone Setting Detection Interval: 360 ms

## 2015-01-31 MED ORDER — AMLODIPINE BESYLATE 5 MG PO TABS: 5.0000 mg | ORAL_TABLET | Freq: Every day | ORAL | Status: DC

## 2015-01-31 MED ORDER — METOPROLOL TARTRATE 25 MG PO TABS: 12.5000 mg | ORAL_TABLET | Freq: Two times a day (BID) | ORAL | Status: DC

## 2015-01-31 NOTE — Patient Instructions (Signed)
Medication Instructions:  Your physician has recommended you make the following change in your medication:  1) Start Amlodipine 5 mg daily 2) Decrease Metoprolol to 1/2 tablet (12.5mg ) twice daily   Labwork: None ordered  Testing/Procedures: None ordered  Follow-Up: Your physician wants you to follow-up in: 12 months with Gypsy Balsam, NP You will receive a reminder letter in the mail two months in advance. If you don't receive a letter, please call our office to schedule the follow-up appointment.  Remote monitoring is used to monitor your Pacemaker or ICD from home. This monitoring reduces the number of office visits required to check your device to one time per year. It allows [place] to keep an eye on the functioning of your device to ensure it is working properly. You are scheduled for a device check from home on 05/02/15. You may send your transmission at any time that day. If you have a wireless device, the transmission will be sent automatically. After your physician reviews your transmission, you will receive a postcard with your next transmission date.      Any Other Special Instructions Will Be Listed Below (If Applicable).

## 2015-01-31 NOTE — Telephone Encounter (Signed)
ATC numbers listed, unable to leave VM. Will attempt to call back if pt does not call us.

## 2015-01-31 NOTE — Progress Notes (Signed)
PCP: Rene Paci, MD Primary Cardiologist:  Dr [place]  Holland [name] is Vincent 74 y.o. male who presents today for routine electrophysiology followup.  Since last being seen in our clinic, the patient reports doing reasonably well.   He has chronic dyspnea/ respiratory failure which is not improved.  He does not have CHF on exam.  Today, he denies symptoms of palpitations, chest pain,  lower extremity edema, dizziness, presyncope, syncope, or ICD shocks.  The patient is otherwise without complaint today.   Past Medical History  Diagnosis Date  . Dyslipidemia     Vincent. on statin  . LBBB (left bundle branch block)   . CAD (coronary artery disease)     Vincent. s/p stent RCA DES 9/09; b. 2014 Attempted PCI of OM1 @ [place];  c. 04/2014 Cath: LAD 40-50p, D1 95-99 (chronic), LCX 30-40 inf branch, OM1 CTO, RCA 30-40p, RCA patent stent, EF 35%->Med Rx.  . HTN (hypertension)     Vincent. Reports intolerance to hydralazine; b. no beta blockers 2/2 bradycardia;  c. failed on ACE and ARB.  [name] Noncompliance   . Chronic combined systolic and diastolic CHF (congestive heart failure)     Vincent. EF about 40 to 45% per echo in April 2013;  b. 04/2014 Echo: EF 20-25%, sev LVH, sev glob HK, Gr 1 DD, mildly reduced RV fxn, PASP .  [name] LVH (left ventricular hypertrophy)   . COPD (chronic obstructive pulmonary disease) dx 06/2013    PFTs 07/08/13: mod obst with resp to bronchodilator, moderate decrease diffusion, airtrapping  . Chronic low back pain   . Mixed Ischemic/Non-ischemic Cardiomyopathy     Vincent. 04/2014 Echo: EF 20-25%, sev glob HK.  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 NSVT (nonsustained ventricular tachycardia)   . HLD (hyperlipidemia)    Past Surgical History  Procedure Laterality Date  . Cardiac catheterization      ejection fraction 50%  . Left heart catheterization with coronary angiogram N/Vincent 05/08/2014    Procedure: LEFT HEART CATHETERIZATION WITH CORONARY ANGIOGRAM;  Surgeon: Micheline Chapman, MD;   Location: [facility] CATH LAB;  Service: Cardiovascular;  Laterality: N/Vincent;  . Bi-ventricular implantable cardioverter defibrillator N/Vincent 08/12/2014    MDT Ovidio Kin XT CRTD implanted by Dr Johney Frame    ROS- all systems are reviewed and negative except as per HPI above  Current Outpatient Prescriptions  Medication Sig Dispense Refill  . albuterol (PROVENTIL HFA;VENTOLIN HFA) 108 (90 BASE) MCG/ACT inhaler Inhale 1 puff into the lungs every 6 (six) hours as needed for wheezing or shortness of breath. 1 Inhaler 6  . aspirin EC 81 MG tablet Take 1 tablet (81 mg total) by mouth daily. 150 tablet 2  . clopidogrel (PLAVIX) 75 MG tablet TAKE 1 TABLET BY MOUTH EVERY DAY 90 tablet 0  . furosemide (LASIX) 40 MG tablet Take 20 mg by mouth daily.     . isosorbide mononitrate (IMDUR) 60 MG 24 hr tablet Take 1 tablet by mouth daily.    [name] losartan (COZAAR) 100 MG tablet Take 1 tablet (100 mg total) by mouth daily. 30 tablet 5  . metoprolol tartrate (LOPRESSOR) 25 MG tablet Take 0.5 tablets (12.5 mg total) by mouth 2 (two) times daily. 60 tablet 6  . potassium chloride (K-DUR,KLOR-CON) 20 MEQ tablet Take 2 tablets (40 mEq total) by mouth daily. 60 tablet 11  . amLODipine (NORVASC) 5 MG tablet Take 1 tablet (5 mg total) by mouth daily. 90 tablet 3  . famotidine (PEPCID) 20  MG tablet One at bedtime (Patient not taking: Reported on 01/31/2015) 30 tablet 2  . nitroGLYCERIN (NITROSTAT) 0.4 MG SL tablet Place 1 tablet (0.4 mg total) under the tongue every 5 (five) minutes as needed for chest pain. (Patient taking differently: Place 0.4 mg under the tongue every 5 (five) minutes as needed for chest pain (MAX 3 TABLETS). ) 30 tablet 0  . omeprazole (PRILOSEC OTC) 20 MG tablet Take 1 tablet (20 mg total) by mouth daily. (Patient not taking: Reported on 01/31/2015) 30 tablet 2   No current facility-administered medications for this visit.    Physical Exam: Filed Vitals:   01/31/15 1145  BP: 190/90  Pulse: 84  Height: 5' 8.5"  (1.74 m)  Weight: 70.035 kg (154 lb 6.4 oz)    GEN- The patient is well appearing, alert and oriented x 3 today.   Head- normocephalic, atraumatic Eyes-  Sclera clear, conjunctiva pink Ears- hearing intact Oropharynx- clear Lungs- diffuse exipratory wheezes with Vincent prolonged expiratory phase, normal work of breathing Chest- ICD pocket is well healed Heart- Regular rate and rhythm, no murmurs, rubs or gallops, PMI not laterally displaced GI- soft, NT, ND, + BS Extremities- no clubbing, cyanosis, or edema  ICD interrogation- reviewed in detail today,  See PACEART report ekg today reveals AV paced rhythm  Assessment and Plan:  1.  Chronic systolic dysfunction/CAD euvolemic today Stable on an appropriate medical regimen Normal BiV ICD function See Pace Art report No changes today  2. HTN Above goal Add norvasc  daily  3. Chronic respiratory failure Reduce metoprolol to 12.5mg  BID  4. NSVT Will need to observe closely on reduced metoprolol  carelink Return to see EP NP in 1 year Follow-up with Dr Sw[place] as scheduled  Pulm workup is ongoing

## 2015-02-01 ENCOUNTER — Ambulatory Visit: Payer: Self-pay | Admitting: Pulmonary Disease

## 2015-02-01 NOTE — Telephone Encounter (Signed)
ATC number listed. Was told I had the wrong number.  Will await call back from pt.

## 2015-02-03 NOTE — Telephone Encounter (Signed)
We ar unable to reach patient due to not having a working phone; we will have to wait for patient to call back.

## 2015-02-04 NOTE — Telephone Encounter (Signed)
Will sign off on message as pt has not called back and he does not have any working numbers.

## 2015-02-14 ENCOUNTER — Other Ambulatory Visit: Payer: Self-pay | Admitting: *Deleted

## 2015-02-14 MED ORDER — LOSARTAN POTASSIUM 100 MG PO TABS: 100.0000 mg | ORAL_TABLET | Freq: Every day | ORAL | Status: DC

## 2015-02-22 ENCOUNTER — Encounter: Payer: Self-pay | Admitting: Pulmonary Disease

## 2015-02-22 ENCOUNTER — Ambulatory Visit (INDEPENDENT_AMBULATORY_CARE_PROVIDER_SITE_OTHER): Payer: Medicare Other | Admitting: Pulmonary Disease

## 2015-02-22 VITALS — BP 130/68 | HR 62 | Ht 68.0 in | Wt 150.0 lb

## 2015-02-22 DIAGNOSIS — J449 Chronic obstructive pulmonary disease, unspecified: Secondary | ICD-10-CM

## 2015-02-22 DIAGNOSIS — I255 Ischemic cardiomyopathy: Secondary | ICD-10-CM | POA: Diagnosis not present

## 2015-02-22 MED ORDER — BUDESONIDE-FORMOTEROL FUMARATE 160-4.5 MCG/ACT IN AERO: 2.0000 | INHALATION_SPRAY | Freq: Two times a day (BID) | RESPIRATORY_TRACT | Status: DC

## 2015-02-22 NOTE — Addendum Note (Signed)
Addended by: Velvet Bathe on: 02/22/2015 04:58 PM   Modules accepted: Orders

## 2015-02-22 NOTE — Assessment & Plan Note (Signed)
He has improved significantly with Symbicort. Unfortunately he still taking it with Advair, I explained to him that this is not safe nor appropriate. Because he is doing well I think we should continue with Symbicort alone at this time. I reviewed the images from his chest x-ray performed at the last visit which show showed emphysema but no other findings. He did not have significant abnormalities and his lab work.  Plan: Stop Advair Stop Incruse Continue Symbicort twice a day Follow-up 6 months

## 2015-02-22 NOTE — Patient Instructions (Signed)
Stop Incruse Stop Advair Use Symbicort 2 puffs twice a day We will see you back in 6 months or sooner if needed

## 2015-02-22 NOTE — Progress Notes (Signed)
Subjective:    Patient ID: [name], male    DOB: 09-20-1940, 74 y.o.   MRN: [id number]  Synopsis: COPD 07/08/13 PFT: FEV1 1.74L (66% pred, 30% change with BD), mod obst with resp to bronchodilator, moderate decrease diffusion, air-trapping 11/2013 Simple spiro>> clear obstruction, FEV1 1.30 L (47% pred) - trial of symbicort 160 2bid 01/26/15   HPI Chief Complaint  Patient presents with  . Follow-up    Pt states breathing has improved since using the sample of symbicort. Pt c/o occasional SOB with activity, chest tightness, prod cough with green thick mucus. Denies any chest congestion.   [name] Says that the symbicort has really helped with his dyspnea.  He last saw Dr Sherene Sires and was prescribed symbicort.  He since has been taking Symbicort and Advair and he says that "they are really perfect".  The incruse didn't help at all. Since then his breathing has improved.  He has not had an exacerbation of his COPD since that visit.  He continues to take his lopressor and his amlodpine.   He continues to smoke some once per month.  He is upset about the way he was treated on the last visit.   Past Medical History  Diagnosis Date  . Dyslipidemia     a. on statin  . LBBB (left bundle branch block)   . CAD (coronary artery disease)     a. s/p stent RCA DES 9/09; b. 2014 Attempted PCI of OM1 @ [place];  c. 04/2014 Cath: LAD 40-50p, D1 95-99 (chronic), LCX 30-40 inf branch, OM1 CTO, RCA 30-40p, RCA patent stent, EF 35%->Med Rx.  . HTN (hypertension)     a. Reports intolerance to hydralazine; b. no beta blockers 2/2 bradycardia;  c. failed on ACE and ARB.  [name] Noncompliance   . Chronic combined systolic and diastolic CHF (congestive heart failure)     a. EF about 40 to 45% per echo in April 2013;  b. 04/2014 Echo: EF 20-25%, sev LVH, sev glob HK, Gr 1 DD, mildly reduced RV fxn, PASP .  [name] LVH (left ventricular hypertrophy)   . COPD (chronic obstructive pulmonary disease) dx 06/2013      PFTs 07/08/13: mod obst with resp to bronchodilator, moderate decrease diffusion, airtrapping  . Chronic low back pain   . Mixed Ischemic/Non-ischemic Cardiomyopathy     a. 04/2014 Echo: EF 20-25%, sev glob HK.  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 NSVT (nonsustained ventricular tachycardia)   . HLD (hyperlipidemia)       Review of Systems     Objective:   Physical Exam Filed Vitals:   02/22/15 1506 02/22/15 1508  BP: 130/68   Pulse:  62  Height:   (1.727 m)  Weight:  150 lb (68.04 kg)  SpO2:  98%  RA  Gen: well appearing HENT: OP clear, TM's clear, neck supple PULM: Crackles bilaterally, bases worse, normal air movment CV: RRR, no mgr, trace edema GI: BS+, soft, nontender Derm: no cyanosis or rash Psyche: normal mood and affect         Assessment & Plan:  COPD with asthma He has improved significantly with Symbicort. Unfortunately he still taking it with Advair, I explained to him that this is not safe nor appropriate. Because he is doing well I think we should continue with Symbicort alone at this time. I reviewed the images from his chest x-ray performed at the last visit which show showed emphysema but no other findings.  He did not have significant abnormalities and his lab work.  Plan: Stop Advair Stop Incruse Continue Symbicort twice a day Follow-up 6 months    Current outpatient prescriptions:  .  albuterol (PROVENTIL HFA;VENTOLIN HFA) 108 (90 BASE) MCG/ACT inhaler, Inhale 1 puff into the lungs every 6 (six) hours as needed for wheezing or shortness of breath., Disp: 1 Inhaler, Rfl: 6 .  amLODipine (NORVASC) 5 MG tablet, Take 1 tablet (5 mg total) by mouth daily., Disp: 90 tablet, Rfl: 3 .  budesonide-formoterol (SYMBICORT) 160-4.5 MCG/ACT inhaler, Inhale 2 puffs into the lungs 2 (two) times daily., Disp: , Rfl:  .  clopidogrel (PLAVIX) 75 MG tablet, TAKE 1 TABLET BY MOUTH EVERY DAY, Disp: 90 tablet, Rfl: 0 .  furosemide (LASIX) 40 MG  tablet, Take 20 mg by mouth daily. , Disp: , Rfl:  .  isosorbide mononitrate (IMDUR) 60 MG 24 hr tablet, Take 1 tablet by mouth daily., Disp: , Rfl:  .  losartan (COZAAR) 100 MG tablet, Take 1 tablet (100 mg total) by mouth daily., Disp: 30 tablet, Rfl: 5 .  metoprolol tartrate (LOPRESSOR) 25 MG tablet, Take 0.5 tablets (12.5 mg total) by mouth 2 (two) times daily., Disp: 60 tablet, Rfl: 6 .  nitroGLYCERIN (NITROSTAT) 0.4 MG SL tablet, Place 1 tablet (0.4 mg total) under the tongue every 5 (five) minutes as needed for chest pain. (Patient taking differently: Place 0.4 mg under the tongue every 5 (five) minutes as needed for chest pain (MAX 3 TABLETS). ), Disp: 30 tablet, Rfl: 0 .  potassium chloride (K-DUR,KLOR-CON) 20 MEQ tablet, Take 2 tablets (40 mEq total) by mouth daily., Disp: 60 tablet, Rfl: 11

## 2015-04-13 ENCOUNTER — Other Ambulatory Visit: Payer: Self-pay | Admitting: Cardiology

## 2015-05-02 ENCOUNTER — Telehealth: Payer: Self-pay | Admitting: Internal Medicine

## 2015-05-02 ENCOUNTER — Ambulatory Visit (INDEPENDENT_AMBULATORY_CARE_PROVIDER_SITE_OTHER): Payer: Medicare Other | Admitting: *Deleted

## 2015-05-02 ENCOUNTER — Encounter: Payer: Self-pay | Admitting: Internal Medicine

## 2015-05-02 DIAGNOSIS — I255 Ischemic cardiomyopathy: Secondary | ICD-10-CM

## 2015-05-02 NOTE — Telephone Encounter (Signed)
LMOM about normal device transmission. Advised to call back if he has further questions.

## 2015-05-02 NOTE — Telephone Encounter (Signed)
Pt calling back about remote transmission. Notified of normal transmission, battery longevity and routine follow up. Pt verbalizes understanding.

## 2015-05-02 NOTE — Telephone Encounter (Signed)
New message     Pt did ICD remote today and pt wanting to know if anything showed up on report Please call to discuss

## 2015-05-03 NOTE — Progress Notes (Signed)
Remote ICD transmission.   

## 2015-05-06 LAB — CUP PACEART REMOTE DEVICE CHECK
Battery Voltage: 3.01 V
Brady Statistic AP VP Percent: 70.63 %
Brady Statistic AP VS Percent: 1.1 %
Brady Statistic AS VP Percent: 27.4 %
Brady Statistic AS VS Percent: 0.87 %
Brady Statistic RV Percent Paced: 97.01 %
HIGH POWER IMPEDANCE MEASURED VALUE: 63 Ohm
Lead Channel Impedance Value: 323 Ohm
Lead Channel Impedance Value: 342 Ohm
Lead Channel Impedance Value: 342 Ohm
Lead Channel Impedance Value: 342 Ohm
Lead Channel Impedance Value: 456 Ohm
Lead Channel Impedance Value: 570 Ohm
Lead Channel Impedance Value: 570 Ohm
Lead Channel Impedance Value: 570 Ohm
Lead Channel Pacing Threshold Amplitude: 0.75 V
Lead Channel Pacing Threshold Amplitude: 0.75 V
Lead Channel Pacing Threshold Amplitude: 1 V
Lead Channel Pacing Threshold Pulse Width: 0.4 ms
Lead Channel Pacing Threshold Pulse Width: 0.4 ms
Lead Channel Pacing Threshold Pulse Width: 0.4 ms
Lead Channel Sensing Intrinsic Amplitude: 1.5 mV
Lead Channel Setting Pacing Amplitude: 2 V
Lead Channel Setting Pacing Amplitude: 2 V
Lead Channel Setting Pacing Pulse Width: 0.4 ms
Lead Channel Setting Pacing Pulse Width: 0.4 ms
MDC IDC MSMT BATTERY REMAINING LONGEVITY: 89 mo
MDC IDC MSMT LEADCHNL LV IMPEDANCE VALUE: 323 Ohm
MDC IDC MSMT LEADCHNL LV IMPEDANCE VALUE: 399 Ohm
MDC IDC MSMT LEADCHNL LV IMPEDANCE VALUE: 570 Ohm
MDC IDC MSMT LEADCHNL LV IMPEDANCE VALUE: 589 Ohm
MDC IDC MSMT LEADCHNL RA SENSING INTR AMPL: 1.5 mV
MDC IDC MSMT LEADCHNL RV IMPEDANCE VALUE: 399 Ohm
MDC IDC MSMT LEADCHNL RV SENSING INTR AMPL: 23.25 mV
MDC IDC MSMT LEADCHNL RV SENSING INTR AMPL: 23.25 mV
MDC IDC SESS DTM: 20160822082205
MDC IDC SET LEADCHNL RA PACING AMPLITUDE: 1.5 V
MDC IDC SET LEADCHNL RV SENSING SENSITIVITY: 0.3 mV
MDC IDC STAT BRADY RA PERCENT PACED: 71.73 %
Zone Setting Detection Interval: 300 ms
Zone Setting Detection Interval: 350 ms
Zone Setting Detection Interval: 350 ms
Zone Setting Detection Interval: 360 ms

## 2015-05-13 ENCOUNTER — Encounter: Payer: Self-pay | Admitting: Cardiology

## 2015-05-27 ENCOUNTER — Encounter: Payer: Self-pay | Admitting: Cardiology

## 2015-07-13 ENCOUNTER — Other Ambulatory Visit: Payer: Self-pay

## 2015-07-13 ENCOUNTER — Other Ambulatory Visit: Payer: Self-pay | Admitting: Cardiology

## 2015-07-13 MED ORDER — CLOPIDOGREL BISULFATE 75 MG PO TABS: 75.0000 mg | ORAL_TABLET | Freq: Every day | ORAL | Status: DC

## 2015-08-03 ENCOUNTER — Ambulatory Visit (INDEPENDENT_AMBULATORY_CARE_PROVIDER_SITE_OTHER): Payer: Medicare Other | Admitting: *Deleted

## 2015-08-03 DIAGNOSIS — I4729 Other ventricular tachycardia: Secondary | ICD-10-CM

## 2015-08-03 DIAGNOSIS — I472 Ventricular tachycardia: Secondary | ICD-10-CM | POA: Diagnosis not present

## 2015-08-03 NOTE — Progress Notes (Signed)
Remote ICD transmission.   

## 2015-08-11 ENCOUNTER — Other Ambulatory Visit: Payer: Self-pay | Admitting: Physician Assistant

## 2015-08-12 ENCOUNTER — Other Ambulatory Visit: Payer: Self-pay | Admitting: Cardiology

## 2015-08-15 ENCOUNTER — Other Ambulatory Visit: Payer: Self-pay | Admitting: Cardiology

## 2015-08-15 LAB — CUP PACEART REMOTE DEVICE CHECK
Battery Remaining Longevity: 85 mo
Brady Statistic AP VP Percent: 70.7 %
Brady Statistic AP VS Percent: 0.97 %
Brady Statistic AS VP Percent: 27.28 %
Brady Statistic AS VS Percent: 1.04 %
Brady Statistic RA Percent Paced: 71.68 %
Brady Statistic RV Percent Paced: 96.48 %
Date Time Interrogation Session: 20161123062304
HighPow Impedance: 70 Ohm
Implantable Lead Implant Date: 20151203
Implantable Lead Implant Date: 20151203
Implantable Lead Location: 753858
Implantable Lead Location: 753860
Implantable Lead Model: 4598
Implantable Lead Model: 5076
Lead Channel Impedance Value: 323 Ohm
Lead Channel Impedance Value: 342 Ohm
Lead Channel Impedance Value: 342 Ohm
Lead Channel Impedance Value: 342 Ohm
Lead Channel Impedance Value: 399 Ohm
Lead Channel Impedance Value: 589 Ohm
Lead Channel Pacing Threshold Amplitude: 0.375 V
Lead Channel Pacing Threshold Amplitude: 0.875 V
Lead Channel Pacing Threshold Amplitude: 1 V
Lead Channel Pacing Threshold Pulse Width: 0.4 ms
Lead Channel Sensing Intrinsic Amplitude: 1.5 mV
Lead Channel Sensing Intrinsic Amplitude: 1.5 mV
Lead Channel Sensing Intrinsic Amplitude: 26.25 mV
Lead Channel Sensing Intrinsic Amplitude: 26.25 mV
Lead Channel Setting Pacing Amplitude: 1.5 V
Lead Channel Setting Pacing Amplitude: 2 V
Lead Channel Setting Pacing Amplitude: 2 V
Lead Channel Setting Pacing Pulse Width: 0.4 ms
MDC IDC LEAD IMPLANT DT: 20151203
MDC IDC LEAD LOCATION: 753859
MDC IDC LEAD MODEL: 6935
MDC IDC MSMT BATTERY VOLTAGE: 3 V
MDC IDC MSMT LEADCHNL LV IMPEDANCE VALUE: 342 Ohm
MDC IDC MSMT LEADCHNL LV IMPEDANCE VALUE: 399 Ohm
MDC IDC MSMT LEADCHNL LV IMPEDANCE VALUE: 570 Ohm
MDC IDC MSMT LEADCHNL LV IMPEDANCE VALUE: 570 Ohm
MDC IDC MSMT LEADCHNL LV IMPEDANCE VALUE: 570 Ohm
MDC IDC MSMT LEADCHNL LV IMPEDANCE VALUE: 589 Ohm
MDC IDC MSMT LEADCHNL LV PACING THRESHOLD PULSEWIDTH: 0.4 ms
MDC IDC MSMT LEADCHNL RA IMPEDANCE VALUE: 399 Ohm
MDC IDC MSMT LEADCHNL RV PACING THRESHOLD PULSEWIDTH: 0.4 ms
MDC IDC SET LEADCHNL LV PACING PULSEWIDTH: 0.4 ms
MDC IDC SET LEADCHNL RV SENSING SENSITIVITY: 0.3 mV

## 2015-08-15 MED ORDER — ISOSORBIDE MONONITRATE ER 60 MG PO TB24: 60.0000 mg | ORAL_TABLET | Freq: Every day | ORAL | Status: DC

## 2015-08-16 ENCOUNTER — Encounter: Payer: Self-pay | Admitting: Cardiology

## 2015-08-30 ENCOUNTER — Encounter: Payer: Self-pay | Admitting: Cardiology

## 2015-09-10 ENCOUNTER — Other Ambulatory Visit: Payer: Self-pay | Admitting: Cardiology

## 2015-09-10 NOTE — Telephone Encounter (Signed)
Rx(s) sent to pharmacy electronically.  

## 2015-09-13 ENCOUNTER — Other Ambulatory Visit: Payer: Self-pay | Admitting: Cardiology

## 2015-09-13 MED ORDER — CLOPIDOGREL BISULFATE 75 MG PO TABS: 75.0000 mg | ORAL_TABLET | Freq: Every day | ORAL | Status: DC

## 2015-09-13 NOTE — Telephone Encounter (Signed)
Please call,pt says he needs to talk to somebody.

## 2015-09-13 NOTE — Telephone Encounter (Signed)
Rx request sent to pharmacy.  

## 2015-09-13 NOTE — Telephone Encounter (Signed)
Returned call to patient plavix refill sent to pharmacy.Appointment scheduled with Dr.Jordan 11/15/15 at 10:45 am.

## 2015-09-13 NOTE — Telephone Encounter (Signed)
°*  STAT* If patient is at the pharmacy, call can be transferred to refill team.   1. Which medications need to be refilled? (please list name of each medication and dose if known) Generic Plavix  2. Which pharmacy/location (including street and city if local pharmacy) is medication to be sent to?[id number]  3. Do they need a 30 day or 90 day supply? 90 and refills

## 2015-10-03 ENCOUNTER — Other Ambulatory Visit: Payer: Self-pay | Admitting: *Deleted

## 2015-10-03 MED ORDER — ISOSORBIDE MONONITRATE ER 60 MG PO TB24: 60.0000 mg | ORAL_TABLET | Freq: Every day | ORAL | Status: DC

## 2015-10-09 ENCOUNTER — Other Ambulatory Visit: Payer: Self-pay | Admitting: Cardiology

## 2015-10-10 NOTE — Telephone Encounter (Signed)
Rx refill sent to pharmacy. 

## 2015-11-02 ENCOUNTER — Ambulatory Visit (INDEPENDENT_AMBULATORY_CARE_PROVIDER_SITE_OTHER): Payer: Medicare Other | Admitting: *Deleted

## 2015-11-02 DIAGNOSIS — I472 Ventricular tachycardia: Secondary | ICD-10-CM | POA: Diagnosis not present

## 2015-11-02 DIAGNOSIS — I4729 Other ventricular tachycardia: Secondary | ICD-10-CM

## 2015-11-02 NOTE — Progress Notes (Signed)
Remote ICD transmission.   

## 2015-11-15 ENCOUNTER — Ambulatory Visit (INDEPENDENT_AMBULATORY_CARE_PROVIDER_SITE_OTHER): Payer: Medicare Other | Admitting: Cardiology

## 2015-11-15 ENCOUNTER — Encounter: Payer: Self-pay | Admitting: Cardiology

## 2015-11-15 VITALS — BP 160/90 | HR 66 | Ht 68.0 in | Wt 153.2 lb

## 2015-11-15 DIAGNOSIS — J449 Chronic obstructive pulmonary disease, unspecified: Secondary | ICD-10-CM

## 2015-11-15 DIAGNOSIS — I5022 Chronic systolic (congestive) heart failure: Secondary | ICD-10-CM

## 2015-11-15 DIAGNOSIS — I10 Essential (primary) hypertension: Secondary | ICD-10-CM

## 2015-11-15 DIAGNOSIS — J45909 Unspecified asthma, uncomplicated: Secondary | ICD-10-CM

## 2015-11-15 DIAGNOSIS — I4729 Other ventricular tachycardia: Secondary | ICD-10-CM

## 2015-11-15 DIAGNOSIS — I2510 Atherosclerotic heart disease of native coronary artery without angina pectoris: Secondary | ICD-10-CM | POA: Diagnosis not present

## 2015-11-15 DIAGNOSIS — I472 Ventricular tachycardia: Secondary | ICD-10-CM

## 2015-11-15 MED ORDER — ISOSORBIDE MONONITRATE ER 60 MG PO TB24: 60.0000 mg | ORAL_TABLET | Freq: Every day | ORAL | Status: DC

## 2015-11-15 MED ORDER — CLOPIDOGREL BISULFATE 75 MG PO TABS: 75.0000 mg | ORAL_TABLET | Freq: Every day | ORAL | Status: DC

## 2015-11-15 NOTE — Patient Instructions (Signed)
Continue your current therapy  We will check lab work today  I will see you in 6 months   

## 2015-11-16 NOTE — Progress Notes (Signed)
[name] Date of Birth: 06-Aug-1941 Medical Record #[id number]  History of Present Illness: [name] is seen back today for follow up of CHF. He has multiple medical issues which include HTN,  HLD and known CAD. He has a history of tobacco abuse. He has LV dysfunction with ejection fraction of 40-45% in April 2013.  Not able to take higher beta blocker dose due to his resting bradycardia.  He had a Myoview study in March of 2013 which showed normal perfusion. Ejection fraction of 40%. He was admitted to [facility] in December 2014 with chest pain. Cardiac cath showed patent RCA stent. There was a 50% mid LAD lesion. The first diagonal and first OM were occluded. Attempted PCI of the OM but unable to cross with wire. Review of old films here showed that this was a chronic obstruction. He has a history of NSVT. He was admitted in  August 2015 with VDRF with acute pulmonary edema. Echo showed drop in EF to 20-25%. In September 2015 he was admitted with syncope associated with bradycardia and overdiuresis. Diruetics were reduced and coreg was held. Repeat Echo in November 2015 showed EF of 30%. He did undergo ICD/BiV implant  by Dr. Johney Frame.  On follow up today he is doing well overall. He has a mild cold with cough and sneezing. No fever.   No chest pain. No increase in edema. Last ICD check in November 2016 showed some NSVT.   Current Outpatient Prescriptions on File Prior to Visit  Medication Sig Dispense Refill  . albuterol (PROVENTIL HFA;VENTOLIN HFA) 108 (90 BASE) MCG/ACT inhaler Inhale 1 puff into the lungs every 6 (six) hours as needed for wheezing or shortness of breath. 1 Inhaler 6  . amLODipine (NORVASC) 5 MG tablet Take 1 tablet (5 mg total) by mouth daily. 90 tablet 3  . budesonide-formoterol (SYMBICORT) 160-4.5 MCG/ACT inhaler Inhale 2 puffs into the lungs 2 (two) times daily. 1 Inhaler 11  . KLOR-CON M20 20 MEQ tablet TAKE 2 TABLETS (40 MEQ TOTAL) BY MOUTH DAILY. 60 tablet  11  . losartan (COZAAR) 100 MG tablet Take 1 tablet (100 mg total) by mouth daily. 30 tablet 5  . metoprolol tartrate (LOPRESSOR) 25 MG tablet Take 0.5 tablets (12.5 mg total) by mouth 2 (two) times daily. 60 tablet 6  . nitroGLYCERIN (NITROSTAT) 0.4 MG SL tablet Place 1 tablet (0.4 mg total) under the tongue every 5 (five) minutes as needed for chest pain. (Patient taking differently: Place 0.4 mg under the tongue every 5 (five) minutes as needed for chest pain (MAX 3 TABLETS). ) 30 tablet 0   No current facility-administered medications on file prior to visit.    Allergies  Allergen Reactions  . Acyclovir And Related     unknown  . Aspirin Other (See Comments)    GI upset at high doses only.    Past Medical History  Diagnosis Date  . Dyslipidemia     a. on statin  . LBBB (left bundle branch block)   . CAD (coronary artery disease)     a. s/p stent RCA DES 9/09; b. 2014 Attempted PCI of OM1 @ [place];  c. 04/2014 Cath: LAD 40-50p, D1 95-99 (chronic), LCX 30-40 inf branch, OM1 CTO, RCA 30-40p, RCA patent stent, EF 35%->Med Rx.  . HTN (hypertension)     a. Reports intolerance to hydralazine; b. no beta blockers 2/2 bradycardia;  c. failed on ACE and ARB.  [name] Noncompliance   .  Chronic combined systolic and diastolic CHF (congestive heart failure) (HCC)     a. EF about 40 to 45% per echo in April 2013;  b. 04/2014 Echo: EF 20-25%, sev LVH, sev glob HK, Gr 1 DD, mildly reduced RV fxn, PASP .  [name] LVH (left ventricular hypertrophy)   . COPD (chronic obstructive pulmonary disease) (HCC) dx 06/2013    PFTs 07/08/13: mod obst with resp to bronchodilator, moderate decrease diffusion, airtrapping  . Chronic low back pain   . Mixed Ischemic/Non-ischemic Cardiomyopathy     a. 04/2014 Echo: EF 20-25%, sev glob HK.  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 NSVT (nonsustained ventricular tachycardia) (HCC)   . HLD (hyperlipidemia)     Past Surgical History  Procedure Laterality Date   . Cardiac catheterization      ejection fraction 50%  . Left heart catheterization with coronary angiogram N/A 05/08/2014    Procedure: LEFT HEART CATHETERIZATION WITH CORONARY ANGIOGRAM;  Surgeon: Micheline Chapman, MD;  Location: [facility] CATH LAB;  Service: Cardiovascular;  Laterality: N/A;  . Bi-ventricular implantable cardioverter defibrillator N/A 08/12/2014    MDT Ovidio Kin XT CRTD implanted by Dr Johney Frame    History  Smoking status  . Former Smoker -- 0.50 packs/day for 56 years  . Types: Cigarettes  . Quit date: 09/10/2012  Smokeless tobacco  . Not on file    History  Alcohol Use No    Family History  Problem Relation Age of Onset  . Heart attack Neg Hx   . Stroke Neg Hx     Review of Systems: As noted in history of present illness  All other systems were reviewed and are negative.  Physical Exam: BP 160/90 mmHg  Pulse 66  Ht 5\' 8"  (1.727 m)  Wt 69.491 kg (153 lb 3.2 oz)  BMI 23.30 kg/m2 Patient is pleasant and in no acute distress. Skin is warm and dry. Color is normal.  HEENT is unremarkable. Normocephalic/atraumatic. PERRL. Sclera are nonicteric. Neck is supple. No masses. No JVD. Lungs reveal diffuse inspiratory and expiratory wheezes.  Cardiac exam shows a regular rate and rhythm. Normal S1-2. No gallop or murmur. ICD site is healing well. Abdomen is soft. Extremities are without edema. Gait and ROM are intact. No gross neurologic deficits noted.   LABORATORY DATA:   Lab Results  Component Value Date   WBC 4.4 01/26/2015   HGB 13.3 01/26/2015   HCT 40.3 01/26/2015   PLT 140.0* 01/26/2015   GLUCOSE 87 01/26/2015   CHOL 208* 06/18/2013   TRIG 71 05/08/2014   HDL 54.20 06/18/2013   LDLDIRECT 132.9 06/18/2013   LDLCALC 68 11/12/2011   ALT 10 06/20/2014   AST 20 06/20/2014   NA 143 01/26/2015   K 4.6 01/26/2015   CL 109 01/26/2015   CREATININE 0.79 01/26/2015   BUN 10 01/26/2015   CO2 28 01/26/2015   TSH 1.89 01/26/2015   INR 1.09 06/20/2014      Assessment / Plan: 1. Chronic systolic CHF with mixed cardiomyopathy. Last EF 30% November 2015. Volume status looks good. Continue current lasix,  Losartan, lopressor.   2. Coronary disease with remote stenting of the right coronary in 2009 with a drug-eluting stent. Chronic occlusion of OM. He is asymptomatic. Myoview study 2013 showed no ischemia.  3. Tobacco abuse. States his is no longer smoking  4. Hyperlipidemia  on Lipitor.   5. COPD with asthma.  6. HTN   7. NSVT  8. S/p ICD/BiV pacemaker.  Will check fasting labs today including chemistries, lipids, and CBC. Follow up in 6 months.

## 2015-11-29 LAB — CBC WITH DIFFERENTIAL/PLATELET
BASOS PCT: 0 % (ref 0–1)
Basophils Absolute: 0 10*3/uL (ref 0.0–0.1)
EOS PCT: 0 % (ref 0–5)
Eosinophils Absolute: 0 10*3/uL (ref 0.0–0.7)
HCT: 40.7 % (ref 39.0–52.0)
Hemoglobin: 13.3 g/dL (ref 13.0–17.0)
Lymphocytes Relative: 53 % — ABNORMAL HIGH (ref 12–46)
Lymphs Abs: 2.8 10*3/uL (ref 0.7–4.0)
MCH: 28.1 pg (ref 26.0–34.0)
MCHC: 32.7 g/dL (ref 30.0–36.0)
MCV: 86 fL (ref 78.0–100.0)
MONO ABS: 0.4 10*3/uL (ref 0.1–1.0)
MPV: 10 fL (ref 8.6–12.4)
Monocytes Relative: 7 % (ref 3–12)
Neutro Abs: 2.1 10*3/uL (ref 1.7–7.7)
Neutrophils Relative %: 40 % — ABNORMAL LOW (ref 43–77)
PLATELETS: 154 10*3/uL (ref 150–400)
RBC: 4.73 MIL/uL (ref 4.22–5.81)
RDW: 14.1 % (ref 11.5–15.5)
WBC: 5.3 10*3/uL (ref 4.0–10.5)

## 2015-11-29 LAB — CUP PACEART REMOTE DEVICE CHECK
Battery Remaining Longevity: 81 mo
Brady Statistic AP VP Percent: 66.46 %
Brady Statistic AP VS Percent: 0.97 %
Brady Statistic RV Percent Paced: 96.41 %
Date Time Interrogation Session: 20170222083624
HighPow Impedance: 65 Ohm
Implantable Lead Implant Date: 20151203
Implantable Lead Implant Date: 20151203
Implantable Lead Implant Date: 20151203
Implantable Lead Model: 4598
Implantable Lead Model: 6935
Lead Channel Impedance Value: 342 Ohm
Lead Channel Impedance Value: 342 Ohm
Lead Channel Impedance Value: 342 Ohm
Lead Channel Impedance Value: 342 Ohm
Lead Channel Impedance Value: 380 Ohm
Lead Channel Impedance Value: 399 Ohm
Lead Channel Pacing Threshold Amplitude: 0.875 V
Lead Channel Sensing Intrinsic Amplitude: 1.375 mV
Lead Channel Sensing Intrinsic Amplitude: 25.25 mV
Lead Channel Setting Pacing Amplitude: 1.5 V
Lead Channel Setting Pacing Amplitude: 2 V
Lead Channel Setting Pacing Pulse Width: 0.4 ms
MDC IDC LEAD LOCATION: 753858
MDC IDC LEAD LOCATION: 753859
MDC IDC LEAD LOCATION: 753860
MDC IDC MSMT BATTERY VOLTAGE: 2.99 V
MDC IDC MSMT LEADCHNL LV IMPEDANCE VALUE: 342 Ohm
MDC IDC MSMT LEADCHNL LV IMPEDANCE VALUE: 570 Ohm
MDC IDC MSMT LEADCHNL LV IMPEDANCE VALUE: 570 Ohm
MDC IDC MSMT LEADCHNL LV IMPEDANCE VALUE: 570 Ohm
MDC IDC MSMT LEADCHNL LV IMPEDANCE VALUE: 589 Ohm
MDC IDC MSMT LEADCHNL LV IMPEDANCE VALUE: 589 Ohm
MDC IDC MSMT LEADCHNL LV PACING THRESHOLD AMPLITUDE: 1 V
MDC IDC MSMT LEADCHNL LV PACING THRESHOLD PULSEWIDTH: 0.4 ms
MDC IDC MSMT LEADCHNL RA PACING THRESHOLD AMPLITUDE: 0.5 V
MDC IDC MSMT LEADCHNL RA PACING THRESHOLD PULSEWIDTH: 0.4 ms
MDC IDC MSMT LEADCHNL RA SENSING INTR AMPL: 1.375 mV
MDC IDC MSMT LEADCHNL RV IMPEDANCE VALUE: 323 Ohm
MDC IDC MSMT LEADCHNL RV PACING THRESHOLD PULSEWIDTH: 0.4 ms
MDC IDC MSMT LEADCHNL RV SENSING INTR AMPL: 25.25 mV
MDC IDC SET LEADCHNL LV PACING PULSEWIDTH: 0.4 ms
MDC IDC SET LEADCHNL RV PACING AMPLITUDE: 2 V
MDC IDC SET LEADCHNL RV SENSING SENSITIVITY: 0.3 mV
MDC IDC STAT BRADY AS VP PERCENT: 31.81 %
MDC IDC STAT BRADY AS VS PERCENT: 0.77 %
MDC IDC STAT BRADY RA PERCENT PACED: 67.43 %

## 2015-11-30 ENCOUNTER — Encounter: Payer: Self-pay | Admitting: Cardiology

## 2015-11-30 ENCOUNTER — Telehealth: Payer: Self-pay | Admitting: Internal Medicine

## 2015-11-30 LAB — BASIC METABOLIC PANEL
BUN: 9 mg/dL (ref 7–25)
CO2: 30 mmol/L (ref 20–31)
Calcium: 9 mg/dL (ref 8.6–10.3)
Chloride: 102 mmol/L (ref 98–110)
Creat: 0.73 mg/dL (ref 0.70–1.18)
Glucose, Bld: 90 mg/dL (ref 65–99)
POTASSIUM: 4.2 mmol/L (ref 3.5–5.3)
SODIUM: 143 mmol/L (ref 135–146)

## 2015-11-30 LAB — LIPID PANEL
CHOL/HDL RATIO: 2.9 ratio (ref <=5.0)
CHOLESTEROL: 202 mg/dL — AB (ref 125–200)
HDL: 70 mg/dL (ref >=40)
LDL Cholesterol: 115 mg/dL (ref <130)
TRIGLYCERIDES: 86 mg/dL (ref <150)
VLDL: 17 mg/dL (ref <30)

## 2015-11-30 LAB — HEPATIC FUNCTION PANEL
ALBUMIN: 4.3 g/dL (ref 3.6–5.1)
ALT: 11 U/L (ref 9–46)
AST: 18 U/L (ref 10–35)
Alkaline Phosphatase: 77 U/L (ref 40–115)
BILIRUBIN INDIRECT: 0.5 mg/dL (ref 0.2–1.2)
Bilirubin, Direct: 0.1 mg/dL (ref <=0.2)
TOTAL PROTEIN: 6.9 g/dL (ref 6.1–8.1)
Total Bilirubin: 0.6 mg/dL (ref 0.2–1.2)

## 2015-11-30 NOTE — Telephone Encounter (Signed)
Called Dr. Elvis Coil nurse, Elnita Maxwell, who advised that she will call patient to clarify his medication list and give further instructions later today.  LMOVM for patient advising that he will receive a call from [place] with further recommendations.

## 2015-11-30 NOTE — Telephone Encounter (Signed)
New Message ° °4. Are you calling to see if we received your device transmission? Yes  ° °

## 2015-11-30 NOTE — Telephone Encounter (Signed)
Spoke w/ pt and informed him that his remote transmission on 11-06-15. Device tech RN gave pt the results to that transmission.

## 2015-11-30 NOTE — Telephone Encounter (Signed)
Reviewed remote transmission results with patient.  He states that he occasionally has dizzy spells that last a few seconds and that he has attempted to call in the past to see if his transmission showed anything, but he states he cannot get through to speak to a person.  Gave patient device clinic phone number and advised that he call us directly next time he has symptoms such as dizziness or shortness of breath.  Patient verbalizes understanding.  He denies increased shortness of breath or dizziness since his appointment with Dr. [place] on 11/15/15.  Patient also wanted to know his lab results.  Advised patient that his CBC and BMP were normal, but that his cholesterol was elevated.  Reviewed patient's active medication list and noted that he is not currently prescribed/taking atorvastatin or furosemide.  Patient states he is unsure if he is supposed to be taking these medications.  Advised patient that I will call Dr. Elvis Coil nurse and call him back.  He requested that I leave a message if he is unable to answer the phone.

## 2015-12-06 ENCOUNTER — Telehealth: Payer: Self-pay | Admitting: Cardiology

## 2015-12-06 NOTE — Telephone Encounter (Signed)
Follow Up: ° ° ° °Returning your call from yesterday. °

## 2015-12-07 ENCOUNTER — Other Ambulatory Visit: Payer: Self-pay

## 2015-12-07 DIAGNOSIS — I10 Essential (primary) hypertension: Secondary | ICD-10-CM

## 2015-12-07 DIAGNOSIS — E785 Hyperlipidemia, unspecified: Secondary | ICD-10-CM

## 2015-12-07 MED ORDER — ATORVASTATIN CALCIUM 20 MG PO TABS: 20.0000 mg | ORAL_TABLET | Freq: Every day | ORAL | Status: DC

## 2015-12-07 NOTE — Telephone Encounter (Signed)
Returned call to patient lab results given. 

## 2015-12-08 NOTE — Telephone Encounter (Signed)
Patient called complaining of cramping in hands,toes,thighs.Stated Dr.Jordan mentioned at last visit might be losartan causing.I spoke to Dr.Jordan he advised losartan would not cause cramping.Bmet done 11/29/15 revealed normal potassium.Advised to continue all medications.Advised to call PCP if continues to have cramping.

## 2015-12-16 ENCOUNTER — Encounter: Payer: Self-pay | Admitting: Cardiology

## 2016-01-30 NOTE — Progress Notes (Addendum)
Electrophysiology Office Note Date: 01/31/2016  ID:  [name], [name] 29-Nov-1940, MRN [id number]  PCP: Rene Paci, MD Primary Cardiologist: [place] Electrophysiologist: Allred  CC: Routine ICD follow-up  [name] is a 75 y.o. male seen today for Dr Johney Frame.  He presents today for routine electrophysiology followup.  Since last being seen in our clinic, the patient reports that he has had recurrent syncope. Last episode was Saturday and occurred after walking.  He also has mouth pain and is pending evaluation at the dental clinic.  He has had recurrent chest pain with exertion that precede syncopal spells. He denies palpitations, dyspnea, PND, orthopnea, nausea, vomiting, edema, weight gain, or early satiety.  He has not had ICD shocks.   Device History: MDT CRTD implanted 2015 for ICM, chronic systolic heart failure History of appropriate therapy: No History of AAD therapy: No   Past Medical History  Diagnosis Date  . Dyslipidemia     a. on statin  . LBBB (left bundle branch block)   . CAD (coronary artery disease)     a. s/p stent RCA DES 9/09; b. 2014 Attempted PCI of OM1 @ [place];  c. 04/2014 Cath: LAD 40-50p, D1 95-99 (chronic), LCX 30-40 inf branch, OM1 CTO, RCA 30-40p, RCA patent stent, EF 35%->Med Rx.  . HTN (hypertension)     a. Reports intolerance to hydralazine; b. no beta blockers 2/2 bradycardia;  c. failed on ACE and ARB.  [name] Noncompliance   . Chronic combined systolic and diastolic CHF (congestive heart failure) (HCC)     a. EF about 40 to 45% per echo in April 2013;  b. 04/2014 Echo: EF 20-25%, sev LVH, sev glob HK, Gr 1 DD, mildly reduced RV fxn, PASP .  [name] LVH (left ventricular hypertrophy)   . COPD (chronic obstructive pulmonary disease) (HCC) dx 06/2013    PFTs 07/08/13: mod obst with resp to bronchodilator, moderate decrease diffusion, airtrapping  . Chronic low back pain   . Mixed Ischemic/Non-ischemic Cardiomyopathy     a. 04/2014 Echo:  EF 20-25%, sev glob HK.  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 NSVT (nonsustained ventricular tachycardia) (HCC)   . HLD (hyperlipidemia)    Past Surgical History  Procedure Laterality Date  . Cardiac catheterization      ejection fraction 50%  . Left heart catheterization with coronary angiogram N/A 05/08/2014    Procedure: LEFT HEART CATHETERIZATION WITH CORONARY ANGIOGRAM;  Surgeon: Micheline Chapman, MD;  Location: [facility] CATH LAB;  Service: Cardiovascular;  Laterality: N/A;  . Bi-ventricular implantable cardioverter defibrillator N/A 08/12/2014    MDT Ovidio Kin XT CRTD implanted by Dr Johney Frame    Current Outpatient Prescriptions  Medication Sig Dispense Refill  . albuterol (PROVENTIL HFA;VENTOLIN HFA) 108 (90 BASE) MCG/ACT inhaler Inhale 1 puff into the lungs every 6 (six) hours as needed for wheezing or shortness of breath. 1 Inhaler 6  . amLODipine (NORVASC) 5 MG tablet Take 1 tablet (5 mg total) by mouth daily. 90 tablet 3  . atorvastatin (LIPITOR) 20 MG tablet Take 1 tablet (20 mg total) by mouth daily. 30 tablet 6  . budesonide-formoterol (SYMBICORT) 160-4.5 MCG/ACT inhaler Inhale 2 puffs into the lungs 2 (two) times daily. 1 Inhaler 11  . clopidogrel (PLAVIX) 75 MG tablet Take 1 tablet (75 mg total) by mouth daily. 90 tablet 3  . isosorbide mononitrate (IMDUR) 60 MG 24 hr tablet Take 1 tablet (60 mg total) by mouth daily. 90 tablet 3  .  KLOR-CON M20 20 MEQ tablet TAKE 2 TABLETS (40 MEQ TOTAL) BY MOUTH DAILY. 60 tablet 11  . losartan (COZAAR) 100 MG tablet Take 1 tablet (100 mg total) by mouth daily. 30 tablet 5  . metoprolol tartrate (LOPRESSOR) 25 MG tablet Take 0.5 tablets (12.5 mg total) by mouth 2 (two) times daily. 60 tablet 6  . nitroGLYCERIN (NITROSTAT) 0.4 MG SL tablet Place 1 tablet (0.4 mg total) under the tongue every 5 (five) minutes as needed for chest pain. (Patient taking differently: Place 0.4 mg under the tongue every 5 (five) minutes as needed for chest pain (MAX  3 TABLETS). ) 30 tablet 0   No current facility-administered medications for this visit.    Allergies:   Acyclovir and related and Aspirin   Social History: Social History   Social History  . Marital Status: Married    Spouse Name: N/A  . Number of Children: 3  . Years of Education: N/A   Occupational History  . security guard.    Social History Main Topics  . Smoking status: Former Smoker -- 0.50 packs/day for 56 years    Types: Cigarettes    Quit date: 09/10/2012  . Smokeless tobacco: Not on file  . Alcohol Use: No  . Drug Use: No  . Sexual Activity: Not Currently   Other Topics Concern  . Not on file   Social History Narrative           Family History: Family History  Problem Relation Age of Onset  . Heart attack Neg Hx   . Stroke Neg Hx     Review of Systems: All other systems reviewed and are otherwise negative except as noted above.   Physical Exam: VS:  BP 180/84 mmHg  Pulse 68  Ht 5\' 8"  (1.727 m)  Wt 152 lb (68.947 kg)  BMI 23.12 kg/m2 , BMI Body mass index is 23.12 kg/(m^2).  GEN- The patient is elderly appearing, alert and oriented x 3 today.   HEENT: normocephalic, atraumatic; sclera clear, conjunctiva pink; hearing intact; oropharynx clear; neck supple, poor dentition Lungs- Clear to ausculation bilaterally, normal work of breathing.  No wheezes, rales, rhonchi Heart- Regular rate and rhythm, no murmurs, rubs or gallops  GI- soft, non-tender, non-distended, bowel sounds present  Extremities- no clubbing, cyanosis, or edema; DP/PT/radial pulses 2+ bilaterally MS- no significant deformity or atrophy Skin- warm and dry, no rash or lesion; ICD pocket well healed Psych- euthymic mood, full affect Neuro- strength and sensation are intact  ICD interrogation- reviewed in detail today,  See PACEART report  EKG:  EKG is ordered today. EKG today shows AV pacing   Recent Labs: 11/29/2015: ALT 11; BUN 9; Creat 0.73; Hemoglobin 13.3; Platelets 154;  Potassium 4.2; Sodium 143   Wt Readings from Last 3 Encounters:  01/31/16 152 lb (68.947 kg)  11/15/15 153 lb 3.2 oz (69.491 kg)  02/22/15 150 lb (68.04 kg)     Other studies Reviewed: Additional studies/ records that were reviewed today include: Dr Johney Frame and Dr Elvis Coil office notes  Assessment and Plan:  1.  Chronic systolic dysfunction euvolemic today Normal ICD function See Pace Art report No changes today Will update echo post CRT implant   2.  HTN BP elevated today Increase Metoprolol as below  3.  COPD Overdue for pulmonary follow up, will arrange O2 sats 98% today on room air   4.  Paroxysmal atrial fibrillation/atrial tachycardia New diagnosis today Some FFRW oversensing on atrial EGM but there are  clear episodes where atrial rate is <274msec.  Predominant arrhythmia is atrial tachycardia  V rates are not controlled Increase Metoprolol to  bid today (will need follow up with pulmonary) - pt advised to monitor for increased shortness of breath CHADS2VASC is at least 5 - longest episode of AF is 20 minutes. Will not anticoagulate for now with ongoing work up for syncope/chest pain (see below) Will need to discuss with Dr [place] dual therapy with OAC/Plavix prior to starting  5.  Chest pain The patient has had recurrent chest pain He has a known chronically occluded OM with last cath 2014 at [facility] Discussed with Dr [place] today - will plan myoview and echo at this time  6.  Syncope He has had previous syncope related to bradycardia and overdiuresis Bradycardia is no longer an issue with CRTD in place He does not appear dry on exam Will update labs today No driving His syncopal spells have been preceded by chest pain Echo and myoview as above He does have AF with RVR (V rates >150bpm) but none documented during syncopal spells Increase metoprolol as above  7.  Mouth pain He has been complaining of upper gum pain and has poor dentition  I have  recommended he be seen by dentist ASAP for treatment  Pt has been advised to go to the ER with worsening symptoms (chest pain, syncope, shortness of breath)   Current medicines are reviewed at length with the patient today.   The patient does not have concerns regarding his medicines.  The following changes were made today:  Increase Metoprolol to  bid   Labs/ tests ordered today include: echo, myoview, CBC, BMET, BNP, TSH   Disposition:   Follow up with [facility] transmissions, Dr Johney Frame 1 year, Dr [place] 2 weeks    Signed, Gypsy Balsam, NP 01/31/2016 9:21 AM  [facility] [address] [place] [address] [phone] (office) [phone] (fax)

## 2016-01-31 ENCOUNTER — Encounter: Payer: Self-pay | Admitting: Nurse Practitioner

## 2016-01-31 ENCOUNTER — Encounter: Payer: Self-pay | Admitting: Internal Medicine

## 2016-01-31 ENCOUNTER — Ambulatory Visit (INDEPENDENT_AMBULATORY_CARE_PROVIDER_SITE_OTHER): Payer: Medicare Other | Admitting: Nurse Practitioner

## 2016-01-31 VITALS — BP 180/84 | HR 68 | Ht 68.0 in | Wt 152.0 lb

## 2016-01-31 DIAGNOSIS — I472 Ventricular tachycardia: Secondary | ICD-10-CM

## 2016-01-31 DIAGNOSIS — R079 Chest pain, unspecified: Secondary | ICD-10-CM

## 2016-01-31 DIAGNOSIS — I10 Essential (primary) hypertension: Secondary | ICD-10-CM | POA: Diagnosis not present

## 2016-01-31 DIAGNOSIS — I4729 Other ventricular tachycardia: Secondary | ICD-10-CM

## 2016-01-31 DIAGNOSIS — I5022 Chronic systolic (congestive) heart failure: Secondary | ICD-10-CM | POA: Diagnosis not present

## 2016-01-31 DIAGNOSIS — I2510 Atherosclerotic heart disease of native coronary artery without angina pectoris: Secondary | ICD-10-CM

## 2016-01-31 DIAGNOSIS — R0602 Shortness of breath: Secondary | ICD-10-CM

## 2016-01-31 DIAGNOSIS — J449 Chronic obstructive pulmonary disease, unspecified: Secondary | ICD-10-CM | POA: Diagnosis not present

## 2016-01-31 DIAGNOSIS — I480 Paroxysmal atrial fibrillation: Secondary | ICD-10-CM

## 2016-01-31 DIAGNOSIS — R55 Syncope and collapse: Secondary | ICD-10-CM

## 2016-01-31 LAB — CBC
HCT: 38.7 % (ref 38.5–50.0)
HEMOGLOBIN: 12.6 g/dL — AB (ref 13.2–17.1)
MCH: 28.3 pg (ref 27.0–33.0)
MCHC: 32.6 g/dL (ref 32.0–36.0)
MCV: 86.8 fL (ref 80.0–100.0)
MPV: 9.2 fL (ref 7.5–12.5)
Platelets: 195 10*3/uL (ref 140–400)
RBC: 4.46 MIL/uL (ref 4.20–5.80)
RDW: 14 % (ref 11.0–15.0)
WBC: 6.8 10*3/uL (ref 3.8–10.8)

## 2016-01-31 LAB — BRAIN NATRIURETIC PEPTIDE: BRAIN NATRIURETIC PEPTIDE: 119.9 pg/mL — AB (ref <100)

## 2016-01-31 LAB — TSH: TSH: 2.67 mIU/L (ref 0.40–4.50)

## 2016-01-31 LAB — BASIC METABOLIC PANEL
BUN: 8 mg/dL (ref 7–25)
CHLORIDE: 108 mmol/L (ref 98–110)
CO2: 26 mmol/L (ref 20–31)
Calcium: 9.2 mg/dL (ref 8.6–10.3)
Creat: 0.79 mg/dL (ref 0.70–1.18)
GLUCOSE: 96 mg/dL (ref 65–99)
POTASSIUM: 3.9 mmol/L (ref 3.5–5.3)
SODIUM: 142 mmol/L (ref 135–146)

## 2016-01-31 LAB — MAGNESIUM: MAGNESIUM: 1.8 mg/dL (ref 1.5–2.5)

## 2016-01-31 MED ORDER — METOPROLOL TARTRATE 25 MG PO TABS: 25.0000 mg | ORAL_TABLET | Freq: Every day | ORAL | Status: DC

## 2016-01-31 MED ORDER — METOPROLOL TARTRATE 25 MG PO TABS: 25.0000 mg | ORAL_TABLET | Freq: Two times a day (BID) | ORAL | Status: DC

## 2016-01-31 MED ORDER — NITROGLYCERIN 0.4 MG SL SUBL: 0.4000 mg | SUBLINGUAL_TABLET | SUBLINGUAL | Status: DC | PRN

## 2016-01-31 NOTE — Patient Instructions (Addendum)
Medication Instructions:   START TAKING METOPROLOL 25 MG TWICE A  DAY    If you need a refill on your cardiac medications before your next appointment, please call your pharmacy.  Labwork: CBC BMET  BNP  TSH AND MAG TODAY    Testing/Procedures:  PLEASE SCHEDULED ON THE SAME DAY BOTH TEST IF POSSIBLE   Your physician has requested that you have an echocardiogram. Echocardiography is a painless test that uses sound waves to create images of your heart. It provides your doctor with information about the size and shape of your heart and how well your heart's chambers and valves are working. This procedure takes approximately one hour. There are no restrictions for this procedure.  Your physician has requested that you have a lexiscan myoview. For further information please visit [email or web address] Please follow instruction sheet, as given.   Follow-Up:   IN 2 WEEKS WITH DR [place] OR APP ON A DAY DR Sw[place] IN THE OFFICE    Remote monitoring is used to monitor your Pacemaker of ICD from home. This monitoring reduces the number of office visits required to check your device to one time per year. It allows [place] to keep an eye on the functioning of your device to ensure it is working properly. You are scheduled for a device check from home on .05/03/16..You may send your transmission at any time that day. If you have a wireless device, the transmission will be sent automatically. After your physician reviews your transmission, you will receive a postcard with your next transmission date.     Any Other Special Instructions Will Be Listed Below (If Applicable).  NO DRIVING AND GO  TO EMERGENCY ROOM IF YOUR SYMPTOMS RETURN   AND MAKE SURE YOU GO TO THE DENTIST

## 2016-02-02 ENCOUNTER — Encounter (HOSPITAL_COMMUNITY): Payer: Self-pay | Admitting: Emergency Medicine

## 2016-02-02 DIAGNOSIS — E785 Hyperlipidemia, unspecified: Secondary | ICD-10-CM | POA: Insufficient documentation

## 2016-02-02 DIAGNOSIS — I5042 Chronic combined systolic (congestive) and diastolic (congestive) heart failure: Secondary | ICD-10-CM | POA: Diagnosis not present

## 2016-02-02 DIAGNOSIS — Z792 Long term (current) use of antibiotics: Secondary | ICD-10-CM | POA: Insufficient documentation

## 2016-02-02 DIAGNOSIS — Z7951 Long term (current) use of inhaled steroids: Secondary | ICD-10-CM | POA: Diagnosis not present

## 2016-02-02 DIAGNOSIS — G8929 Other chronic pain: Secondary | ICD-10-CM | POA: Insufficient documentation

## 2016-02-02 DIAGNOSIS — I2510 Atherosclerotic heart disease of native coronary artery without angina pectoris: Secondary | ICD-10-CM | POA: Diagnosis not present

## 2016-02-02 DIAGNOSIS — K047 Periapical abscess without sinus: Secondary | ICD-10-CM | POA: Diagnosis not present

## 2016-02-02 DIAGNOSIS — Z9889 Other specified postprocedural states: Secondary | ICD-10-CM | POA: Insufficient documentation

## 2016-02-02 DIAGNOSIS — Z79899 Other long term (current) drug therapy: Secondary | ICD-10-CM | POA: Insufficient documentation

## 2016-02-02 DIAGNOSIS — I10 Essential (primary) hypertension: Secondary | ICD-10-CM | POA: Insufficient documentation

## 2016-02-02 DIAGNOSIS — Z9119 Patient's noncompliance with other medical treatment and regimen: Secondary | ICD-10-CM | POA: Diagnosis not present

## 2016-02-02 DIAGNOSIS — Z87891 Personal history of nicotine dependence: Secondary | ICD-10-CM | POA: Insufficient documentation

## 2016-02-02 DIAGNOSIS — K0889 Other specified disorders of teeth and supporting structures: Secondary | ICD-10-CM | POA: Diagnosis present

## 2016-02-02 DIAGNOSIS — Z7902 Long term (current) use of antithrombotics/antiplatelets: Secondary | ICD-10-CM | POA: Insufficient documentation

## 2016-02-02 DIAGNOSIS — J449 Chronic obstructive pulmonary disease, unspecified: Secondary | ICD-10-CM | POA: Insufficient documentation

## 2016-02-02 DIAGNOSIS — I4891 Unspecified atrial fibrillation: Secondary | ICD-10-CM | POA: Diagnosis not present

## 2016-02-02 NOTE — ED Notes (Signed)
Pt. reports upper front dental pain with swelling onset this week unrelieved by OTC pain medication .

## 2016-02-03 ENCOUNTER — Observation Stay (HOSPITAL_COMMUNITY)
Admission: EM | Admit: 2016-02-03 | Discharge: 2016-02-04 | Disposition: A | Discharge status: Home / Self Care | Payer: Medicare Other | Attending: Internal Medicine | Admitting: Internal Medicine

## 2016-02-03 ENCOUNTER — Encounter (HOSPITAL_COMMUNITY): Payer: Self-pay | Admitting: *Deleted

## 2016-02-03 ENCOUNTER — Emergency Department (HOSPITAL_COMMUNITY)
Admission: EM | Admit: 2016-02-03 | Discharge: 2016-02-03 | Disposition: A | Discharge status: Home / Self Care | Payer: Medicaid Other | Attending: Emergency Medicine | Admitting: Emergency Medicine

## 2016-02-03 DIAGNOSIS — E785 Hyperlipidemia, unspecified: Secondary | ICD-10-CM | POA: Diagnosis not present

## 2016-02-03 DIAGNOSIS — E782 Mixed hyperlipidemia: Secondary | ICD-10-CM | POA: Diagnosis present

## 2016-02-03 DIAGNOSIS — Z87891 Personal history of nicotine dependence: Secondary | ICD-10-CM | POA: Insufficient documentation

## 2016-02-03 DIAGNOSIS — Z79899 Other long term (current) drug therapy: Secondary | ICD-10-CM | POA: Insufficient documentation

## 2016-02-03 DIAGNOSIS — J449 Chronic obstructive pulmonary disease, unspecified: Secondary | ICD-10-CM | POA: Insufficient documentation

## 2016-02-03 DIAGNOSIS — I5042 Chronic combined systolic (congestive) and diastolic (congestive) heart failure: Secondary | ICD-10-CM | POA: Insufficient documentation

## 2016-02-03 DIAGNOSIS — I480 Paroxysmal atrial fibrillation: Secondary | ICD-10-CM | POA: Diagnosis not present

## 2016-02-03 DIAGNOSIS — I2510 Atherosclerotic heart disease of native coronary artery without angina pectoris: Secondary | ICD-10-CM | POA: Diagnosis not present

## 2016-02-03 DIAGNOSIS — K047 Periapical abscess without sinus: Secondary | ICD-10-CM

## 2016-02-03 DIAGNOSIS — K029 Dental caries, unspecified: Secondary | ICD-10-CM | POA: Insufficient documentation

## 2016-02-03 DIAGNOSIS — L0201 Cutaneous abscess of face: Secondary | ICD-10-CM | POA: Diagnosis not present

## 2016-02-03 DIAGNOSIS — I10 Essential (primary) hypertension: Secondary | ICD-10-CM | POA: Diagnosis present

## 2016-02-03 DIAGNOSIS — I110 Hypertensive heart disease with heart failure: Secondary | ICD-10-CM | POA: Insufficient documentation

## 2016-02-03 DIAGNOSIS — J4489 Other specified chronic obstructive pulmonary disease: Secondary | ICD-10-CM | POA: Diagnosis present

## 2016-02-03 LAB — CBC WITH DIFFERENTIAL/PLATELET
Basophils Absolute: 0 10*3/uL (ref 0.0–0.1)
Basophils Relative: 0 %
EOS ABS: 0 10*3/uL (ref 0.0–0.7)
EOS PCT: 0 %
HCT: 38.3 % — ABNORMAL LOW (ref 39.0–52.0)
Hemoglobin: 12.3 g/dL — ABNORMAL LOW (ref 13.0–17.0)
LYMPHS ABS: 1.5 10*3/uL (ref 0.7–4.0)
Lymphocytes Relative: 16 %
MCH: 28 pg (ref 26.0–34.0)
MCHC: 32.1 g/dL (ref 30.0–36.0)
MCV: 87 fL (ref 78.0–100.0)
MONO ABS: 1.1 10*3/uL — AB (ref 0.1–1.0)
MONOS PCT: 11 %
Neutro Abs: 7 10*3/uL (ref 1.7–7.7)
Neutrophils Relative %: 73 %
PLATELETS: 229 10*3/uL (ref 150–400)
RBC: 4.4 MIL/uL (ref 4.22–5.81)
RDW: 14 % (ref 11.5–15.5)
WBC: 9.7 10*3/uL (ref 4.0–10.5)

## 2016-02-03 LAB — I-STAT CHEM 8, ED
BUN: 11 mg/dL (ref 6–20)
CALCIUM ION: 1.16 mmol/L (ref 1.13–1.30)
CHLORIDE: 105 mmol/L (ref 101–111)
CREATININE: 0.8 mg/dL (ref 0.61–1.24)
GLUCOSE: 99 mg/dL (ref 65–99)
HCT: 41 % (ref 39.0–52.0)
Hemoglobin: 13.9 g/dL (ref 13.0–17.0)
Potassium: 4 mmol/L (ref 3.5–5.1)
Sodium: 142 mmol/L (ref 135–145)
TCO2: 26 mmol/L (ref 0–100)

## 2016-02-03 MED ORDER — HYDROCODONE-ACETAMINOPHEN 5-325 MG PO TABS: 1.0000 | ORAL_TABLET | Freq: Four times a day (QID) | ORAL | Status: DC | PRN

## 2016-02-03 MED ORDER — PENICILLIN V POTASSIUM 500 MG PO TABS: 500.0000 mg | ORAL_TABLET | Freq: Four times a day (QID) | ORAL | Status: AC

## 2016-02-03 MED ORDER — PENICILLIN V POTASSIUM 250 MG PO TABS
500.0000 mg | ORAL_TABLET | Freq: Once | ORAL | Status: AC
  Administered 2016-02-03: 500 mg via ORAL
  Filled 2016-02-03: qty 2

## 2016-02-03 MED ORDER — FENTANYL CITRATE (PF) 100 MCG/2ML IJ SOLN
50.0000 ug | Freq: Once | INTRAMUSCULAR | Status: AC
  Administered 2016-02-03: 50 ug via INTRAVENOUS
  Filled 2016-02-03: qty 2

## 2016-02-03 NOTE — ED Provider Notes (Signed)
CSN: [id number]     Arrival date & time 02/03/16  2116 History  By signing my name below, I, Linna Darner, attest that this documentation has been prepared under the direction and in the presence of non-physician practitioner, Melburn Hake, PA-C. Electronically Signed: Linna Darner, Scribe. 02/03/2016. 9:55 PM.   Chief Complaint  Patient presents with  . Dental Pain    The history is provided by the patient. No language interpreter was used.     HPI Comments: MELESIO ETUE is a 75 y.o. male with PMH of CAD, HTN, CHF, CPOD, afib and defibrillator who presents to the Emergency Department complaining of sudden onset, constant, worsening, throbbing, severe, upper right dental pain and abscess for the last three days. Pt reports that he initially experienced pain but has also experienced worsening right facial swelling since onset. Pt endorses intermittent fever and associated diaphoresis since onset as well. Pt was seen here last night for the same reason and was prescribed penicillin; he has taken 4 pills total. Pt was also prescribed pain medication which he reports taking without relief. Pt has not experienced similar symptoms in the past. Pt has rinsed the area with peroxide with no relief. He has no known allergies to medication. He notes he is on Plavix for his heart issues since 2015. He denies drainage from his abscess, trouble swallowing, difficulty breathing or any other associated symptoms. Denies having dentist.   Past Medical History  Diagnosis Date  . Dyslipidemia     a. on statin  . LBBB (left bundle branch block)   . CAD (coronary artery disease)     a. s/p stent RCA DES 9/09; b. 2014 Attempted PCI of OM1 @ [place];  c. 04/2014 Cath: LAD 40-50p, D1 95-99 (chronic), LCX 30-40 inf branch, OM1 CTO, RCA 30-40p, RCA patent stent, EF 35%->Med Rx.  . HTN (hypertension)     a. Reports intolerance to hydralazine; b. no beta blockers 2/2 bradycardia;  c. failed on ACE and ARB.  [name]  Noncompliance   . Chronic combined systolic and diastolic CHF (congestive heart failure) (HCC)     a. EF about 40 to 45% per echo in April 2013;  b. 04/2014 Echo: EF 20-25%, sev LVH, sev glob HK, Gr 1 DD, mildly reduced RV fxn, PASP .  [name] LVH (left ventricular hypertrophy)   . COPD (chronic obstructive pulmonary disease) (HCC) dx 06/2013    PFTs 07/08/13: mod obst with resp to bronchodilator, moderate decrease diffusion, airtrapping  . Chronic low back pain   . Mixed Ischemic/Non-ischemic Cardiomyopathy     a. 04/2014 Echo: EF 20-25%, sev glob HK.  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 NSVT (nonsustained ventricular tachycardia) (HCC)   . HLD (hyperlipidemia)   . Paroxysmal atrial fibrillation (HCC)     a. identified on device interrogation 01/2016   Past Surgical History  Procedure Laterality Date  . Cardiac catheterization      ejection fraction 50%  . Left heart catheterization with coronary angiogram N/A 05/08/2014    Procedure: LEFT HEART CATHETERIZATION WITH CORONARY ANGIOGRAM;  Surgeon: Micheline Chapman, MD;  Location: [facility] CATH LAB;  Service: Cardiovascular;  Laterality: N/A;  . Bi-ventricular implantable cardioverter defibrillator N/A 08/12/2014    MDT Ovidio Kin XT CRTD implanted by Dr Johney Frame   Family History  Problem Relation Age of Onset  . Heart attack Neg Hx   . Stroke Neg Hx    Social History  Substance Use Topics  . Smoking status: Former  Smoker -- 0.50 packs/day for 56 years    Types: Cigarettes    Quit date: 09/10/2012  . Smokeless tobacco: None  . Alcohol Use: No    Review of Systems  Constitutional: Positive for fever and diaphoresis.  HENT: Positive for dental problem (right upper) and facial swelling (right). Negative for trouble swallowing.        Negative for dental drainage.    Allergies  Acyclovir and related and Aspirin  Home Medications   Prior to Admission medications   Medication Sig Start Date End Date Taking? Authorizing Provider   acetaminophen (TYLENOL) 650 MG CR tablet Take 1,300 mg by mouth every 8 (eight) hours as needed for pain.    Historical Provider, MD  albuterol (PROVENTIL HFA;VENTOLIN HFA) 108 (90 BASE) MCG/ACT inhaler Inhale 1 puff into the lungs every 6 (six) hours as needed for wheezing or shortness of breath. 11/26/14   Peter M [place], MD  amLODipine (NORVASC) 5 MG tablet Take 1 tablet (5 mg total) by mouth daily. 01/31/15   Hillis Range, MD  atorvastatin (LIPITOR) 20 MG tablet Take 1 tablet (20 mg total) by mouth daily. 12/07/15   Peter M [place], MD  budesonide-formoterol [facility]) 160-4.5 MCG/ACT inhaler Inhale 2 puffs into the lungs 2 (two) times daily. 02/22/15   Lupita Leash, MD  clopidogrel (PLAVIX) 75 MG tablet Take 1 tablet (75 mg total) by mouth daily. 11/15/15   Peter M [place], MD  HYDROcodone-acetaminophen (NORCO/VICODIN) 5-325 MG tablet Take 1 tablet by mouth every 6 (six) hours as needed for moderate pain or severe pain. 02/03/16   Lavera Guise, MD  isosorbide mononitrate (IMDUR) 60 MG 24 hr tablet Take 1 tablet (60 mg total) by mouth daily. 11/15/15   Peter M [place], MD  KLOR-CON M20 20 MEQ tablet TAKE 2 TABLETS (40 MEQ TOTAL) BY MOUTH DAILY. 08/11/15   Beatrice Lecher, PA-C  losartan (COZAAR) 100 MG tablet Take 1 tablet (100 mg total) by mouth daily. 09/10/15   Peter M [place], MD  metoprolol tartrate (LOPRESSOR) 25 MG tablet Take 1 tablet (25 mg total) by mouth 2 (two) times daily. 01/31/16   Amber Caryl Bis, NP  nitroGLYCERIN (NITROSTAT) 0.4 MG SL tablet Place 1 tablet (0.4 mg total) under the tongue every 5 (five) minutes as needed for chest pain. 01/31/16 07/19/17  Amber Caryl Bis, NP  penicillin v potassium (VEETID) 500 MG tablet Take 1 tablet (500 mg total) by mouth 4 (four) times daily. 02/03/16 02/10/16  Lavera Guise, MD   BP 170/82 mmHg  Pulse 78  Temp(Src) 99.4 F (37.4 C) (Oral)  Resp 18  Wt 68.947 kg  SpO2 98% Physical Exam  Constitutional: He is oriented to person, place, and time. He  appears well-developed and well-nourished.  HENT:  Head: Normocephalic and atraumatic.  Mouth/Throat: Uvula is midline, oropharynx is clear and moist and mucous membranes are normal. No trismus in the jaw. Abnormal dentition. Dental abscesses and dental caries present. No uvula swelling. No oropharyngeal exudate, posterior oropharyngeal edema, posterior oropharyngeal erythema or tonsillar abscesses.  Poor dentition noted throughout with multiple decaying and extracted teeth bilaterally. Partially adentulous in upper teeth. Mid-upper gum line with erythema, swelling, and fluctuance. No drainage. Mild upper lip swelling extending into right maxillary region, tender to palpation.  Eyes: Conjunctivae and EOM are normal. Right eye exhibits no discharge. Left eye exhibits no discharge. No scleral icterus.  Neck: Normal range of motion. Neck supple.  No neck swelling.  Cardiovascular: Normal rate.  Pulmonary/Chest: Effort normal and breath sounds normal.  Lymphadenopathy:    He has no cervical adenopathy.  Neurological: He is alert and oriented to person, place, and time.  Nursing note and vitals reviewed.   ED Course  Procedures (including critical care time)  DIAGNOSTIC STUDIES: Oxygen Saturation is 95% on RA, adequate by my interpretation.    COORDINATION OF CARE: 9:55 PM Discussed treatment plan with pt at bedside and pt agreed to plan.  Labs Review Labs Reviewed  CBC WITH DIFFERENTIAL/PLATELET - Abnormal; Notable for the following:    Hemoglobin 12.3 (*)    HCT 38.3 (*)    Monocytes Absolute 1.1 (*)    All other components within normal limits  I-STAT CHEM 8, ED    Imaging Review Ct Maxillofacial W/cm  02/04/2016  CLINICAL DATA:  Acute onset of upper dental pain and swelling. Assess for abscess. Initial encounter. EXAM: CT MAXILLOFACIAL WITH CONTRAST TECHNIQUE: Multidetector CT imaging of the maxillofacial structures was performed with intravenous contrast. Multiplanar CT image  reconstructions were also generated. A small metallic BB was placed on the right temple in order to reliably differentiate right from left. CONTRAST:  75mL ISOVUE-300 IOPAMIDOL (ISOVUE-300) INJECTION 61% COMPARISON:  CT of the head performed 05/23/2008 FINDINGS: Prominent soft tissue swelling is noted overlying the right mandible and overlying the central maxilla, with a 1.9 x 1.6 x 0.8 cm soft tissue abscess overlying the right central maxilla. There is no evidence of fracture or dislocation. The maxilla and mandible appear intact. The nasal bone is unremarkable in appearance. Numerous large maxillary and mandibular dental caries are noted. Dental caries involve all of the visualized maxillary teeth. A periapical abscess is noted at the root of the right second mandibular premolar, and lucency is noted about the root of the left lateral mandibular incisor. A periapical abscess is also suggested at the root of the left central maxillary incisor, with cortical breakthrough, and mild loosening is noted about the roots of the remaining bilateral maxillary premolars. The orbits are intact bilaterally. Mucosal thickening is noted at the right maxillary sinus, and a small mucus retention cyst or polyp is noted at the left maxillary sinus. The remaining visualized paranasal sinuses and mastoid air cells are well-aerated. Soft tissue swelling is noted inferior to the right orbit. The parapharyngeal fat planes are preserved. The nasopharynx, oropharynx and hypopharynx are unremarkable in appearance. The visualized portions of the valleculae and piriform sinuses are grossly unremarkable. Minimal calcification is seen at the carotid bifurcations bilaterally. The parotid and submandibular glands are within normal limits. No cervical lymphadenopathy is seen. The visualized portions of the brain are unremarkable, aside from mild cerebellar atrophy. IMPRESSION: 1. 1.9 x 1.6 x 0.8 cm soft tissue abscess overlying the right central  maxilla. 2. Prominent soft tissue swelling overlying the central maxilla and right mandible, and inferior to the right orbit. 3. Periapical abscess at the root of the left central maxillary incisor, with cortical breakthrough. This may correspond to the soft tissue abscess. 4. Periapical abscess at the root of the right second mandibular premolar, and lucency about the root of the left lateral mandibular incisor. 5. Numerous large maxillary and mandibular dental caries noted; dental caries involve all of the visualized maxillary teeth. Mild loosening noted about the roots of the remaining bilateral maxillary premolars. 6. Mucosal thickening at the right maxillary sinus, and small mucus retention cyst or polyp at the left maxillary sinus. 7. Minimal calcification at the carotid bifurcations bilaterally. 8. Mild cerebellar atrophy noted.  Electronically Signed   By: Roanna Raider M.D.   On: 02/04/2016 01:05   I have personally reviewed and evaluated these images and lab results as part of my medical decision-making.   EKG Interpretation None      MDM   Final diagnoses:  Dental abscess  Facial abscess    Pt presents with worsening dental pain and facial swelling with intermittent fevers. Patient reports he was seen in the ED yesterday for similar symptoms and sent home with penicillin and pain meds. Patient reports worsening of symptoms after starting antibiotics. Temp 99.4. BP 189/75. Exam revealed poor dentition noted throughout with multiple decaying and extracted teeth. Mid upper gumline with erythema, swelling and fluctuance. Swelling and fluctuance noted to right maxilla region. Airway intact. Patient able to tolerate secretions. Due to worsening swelling will order CT maxillofacial to rule out facial abscess. Patient given pain meds. Labs unremarkable. CT revealed soft tissue abscess overlying right central maxilla, periapical abscess at the root of left central maxillary incisor and right second  mandibular premolar, numerous maxillary and mandibular dental caries noted. Consult at pharmacy regarding IV clindamycin versus Unasyn, advised that either antibiotic would be appropriate coverage for facial abscess. Patient given IV Unasyn in the ED. Discussed results and plan for admission for IV antibiotics with patient. Consult hospitalist. Dr. Katrinka Blazing agrees to admission, orders place for MedSurg bed.  I personally performed the services described in this documentation, which was scribed in my presence. The recorded information has been reviewed and is accurate.   Satira Sark Dunnigan, [place] 02/04/16 0201  Derwood Kaplan, MD 02/04/16 [phone]

## 2016-02-03 NOTE — ED Notes (Signed)
Pt reports 4 days of upper front dental pain/abscess. Pt seen here for the same yesterday. Given abx for the same, states increased swelling to the right face. Has intermittent fevers since yesterday.

## 2016-02-03 NOTE — ED Provider Notes (Signed)
CSN: [id number]     Arrival date & time 02/02/16  2317 History  By signing my name below, I, [facility], attest that this documentation has been prepared under the direction and in the presence of Lavera Guise, MD. Electronically Signed: Randell Patient, ED Scribe. 02/03/2016. 1:50 AM.   Chief Complaint  Patient presents with  . Dental Pain   The history is provided by the patient. No language interpreter was used.   HPI Comments: MATEUS MCFARLING is a 75 y.o. male who presents to the Emergency Department complaining of constant, gradually worsening, moderate frontal upper right dental pain onset 3 days ago. Pt states that he has many broken teeth and most recently broke one six months ago and was asymptomatic until three days when his pain began. He reports a fever 100.78F and right-sided upper lip swelling. He has taken Tylenol. He has been unable to eat solid foods secondary to pain with chewing or take his prescribed medications secondary to pain. Denies having a dentist currently. Denies difficulty swallowing, nausea, vomiting, and any other symptoms currently.  Past Medical History  Diagnosis Date  . Dyslipidemia     a. on statin  . LBBB (left bundle branch block)   . CAD (coronary artery disease)     a. s/p stent RCA DES 9/09; b. 2014 Attempted PCI of OM1 @ [place];  c. 04/2014 Cath: LAD 40-50p, D1 95-99 (chronic), LCX 30-40 inf branch, OM1 CTO, RCA 30-40p, RCA patent stent, EF 35%->Med Rx.  . HTN (hypertension)     a. Reports intolerance to hydralazine; b. no beta blockers 2/2 bradycardia;  c. failed on ACE and ARB.  [name] Noncompliance   . Chronic combined systolic and diastolic CHF (congestive heart failure) (HCC)     a. EF about 40 to 45% per echo in April 2013;  b. 04/2014 Echo: EF 20-25%, sev LVH, sev glob HK, Gr 1 DD, mildly reduced RV fxn, PASP .  [name] LVH (left ventricular hypertrophy)   . COPD (chronic obstructive pulmonary disease) (HCC) dx 06/2013    PFTs  07/08/13: mod obst with resp to bronchodilator, moderate decrease diffusion, airtrapping  . Chronic low back pain   . Mixed Ischemic/Non-ischemic Cardiomyopathy     a. 04/2014 Echo: EF 20-25%, sev glob HK.  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 NSVT (nonsustained ventricular tachycardia) (HCC)   . HLD (hyperlipidemia)   . Paroxysmal atrial fibrillation (HCC)     a. identified on device interrogation 01/2016   Past Surgical History  Procedure Laterality Date  . Cardiac catheterization      ejection fraction 50%  . Left heart catheterization with coronary angiogram N/A 05/08/2014    Procedure: LEFT HEART CATHETERIZATION WITH CORONARY ANGIOGRAM;  Surgeon: Micheline Chapman, MD;  Location: [facility] CATH LAB;  Service: Cardiovascular;  Laterality: N/A;  . Bi-ventricular implantable cardioverter defibrillator N/A 08/12/2014    MDT Ovidio Kin XT CRTD implanted by Dr Johney Frame   Family History  Problem Relation Age of Onset  . Heart attack Neg Hx   . Stroke Neg Hx    Social History  Substance Use Topics  . Smoking status: Former Smoker -- 0.50 packs/day for 56 years    Types: Cigarettes    Quit date: 09/10/2012  . Smokeless tobacco: None  . Alcohol Use: No    Review of Systems  Constitutional: Positive for fever.  HENT: Positive for dental problem and facial swelling. Negative for trouble swallowing.   Gastrointestinal: Negative for nausea  and vomiting.  All other systems reviewed and are negative.   Allergies  Acyclovir and related and Aspirin  Home Medications   Prior to Admission medications   Medication Sig Start Date End Date Taking? Authorizing Provider  acetaminophen (TYLENOL) 650 MG CR tablet Take 1,300 mg by mouth every 8 (eight) hours as needed for pain.   Yes Historical Provider, MD  albuterol (PROVENTIL HFA;VENTOLIN HFA) 108 (90 BASE) MCG/ACT inhaler Inhale 1 puff into the lungs every 6 (six) hours as needed for wheezing or shortness of breath. 11/26/14  Yes Peter M [place],  MD  amLODipine (NORVASC) 5 MG tablet Take 1 tablet (5 mg total) by mouth daily. 01/31/15  Yes Hillis Range, MD  atorvastatin (LIPITOR) 20 MG tablet Take 1 tablet (20 mg total) by mouth daily. 12/07/15  Yes Peter M [place], MD  budesonide-formoterol [facility]) 160-4.5 MCG/ACT inhaler Inhale 2 puffs into the lungs 2 (two) times daily. 02/22/15  Yes Lupita Leash, MD  clopidogrel (PLAVIX) 75 MG tablet Take 1 tablet (75 mg total) by mouth daily. 11/15/15  Yes Peter M [place], MD  isosorbide mononitrate (IMDUR) 60 MG 24 hr tablet Take 1 tablet (60 mg total) by mouth daily. 11/15/15  Yes Peter M [place], MD  KLOR-CON M20 20 MEQ tablet TAKE 2 TABLETS (40 MEQ TOTAL) BY MOUTH DAILY. 08/11/15  Yes Scott T Alben Spittle, PA-C  losartan (COZAAR) 100 MG tablet Take 1 tablet (100 mg total) by mouth daily. 09/10/15  Yes Peter M [place], MD  metoprolol tartrate (LOPRESSOR) 25 MG tablet Take 1 tablet (25 mg total) by mouth 2 (two) times daily. 01/31/16  Yes Amber Caryl Bis, NP  nitroGLYCERIN (NITROSTAT) 0.4 MG SL tablet Place 1 tablet (0.4 mg total) under the tongue every 5 (five) minutes as needed for chest pain. 01/31/16 07/19/17 Yes Amber Caryl Bis, NP  HYDROcodone-acetaminophen (NORCO/VICODIN) 5-325 MG tablet Take 1 tablet by mouth every 6 (six) hours as needed for moderate pain or severe pain. 02/03/16   Lavera Guise, MD  penicillin v potassium (VEETID) 500 MG tablet Take 1 tablet (500 mg total) by mouth 4 (four) times daily. 02/03/16 02/10/16  Lavera Guise, MD   BP 194/79 mmHg  Pulse 53  Temp(Src) 99.1 F (37.3 C) (Oral)  Resp 16  Ht  (1.727 m)  Wt 150 lb (68.04 kg)  BMI 22.81 kg/m2  SpO2 99% Physical Exam Physical Exam  Nursing note and vitals reviewed. Constitutional: Well developed, well nourished, non-toxic, and in no acute distress Head: Normocephalic and atraumatic.  Mouth/Throat: Oropharynx is clear and moist. Poor dentition. Near adentulous in upper teeth with few fractured teeth. Upper gum line with erythema  and mild swelling over the left upper gingival line around a few fractured teeth without fluctuance. No trismus. Mild upper lip swelling. Neck: Normal range of motion. Neck supple.  Cardiovascular: Normal rate and regular rhythm.   Pulmonary/Chest: Effort normal and breath sounds normal.  Abdominal: Soft. There is no tenderness. There is no rebound and no guarding.  Musculoskeletal: Normal range of motion.  Neurological: Alert, no facial droop, fluent speech, moves all extremities symmetrically Skin: Skin is warm and dry.  Psychiatric: Cooperative  ED Course  Procedures (including critical care time)  DIAGNOSTIC STUDIES: Oxygen Saturation is 99% on RA, normal by my interpretation.    COORDINATION OF CARE: 1:42 AM Will order Veetid. Will prescribe Veetid and pain medication. Discussed treatment plan with pt at bedside and pt agreed to plan.  MDM  Final diagnoses:  Dental abscess    75 year old male who presents with dental pain. He is afebrile and hemodynamically stable here. Poor dentition overall with near adentulous upper teeth and remaining teeth all fractured. With gingival erythema and swelling over right upper teeth. C/f dental abscess. Given dental resources. Will treat with course of penicicllin. Strict return and follow-up instructions reviewed. He expressed understanding of all discharge instructions and felt comfortable with the plan of care.   I personally performed the services described in this documentation, which was scribed in my presence. The recorded information has been reviewed and is accurate.    Lavera Guise, MD 02/03/16 [phone]

## 2016-02-03 NOTE — Discharge Instructions (Signed)
Please take antibiotics as prescribed. Please call a dentist of first thing on Monday to set up close appointment regarding further ongoing dental care. Please return without fail for worsening symptoms, including persistent fever despite antibiotics, worsening swelling, difficulty breathing, difficulty swallowing, or any other symptoms concerning to you.  Dental Abscess A dental abscess is a collection of pus in or around a tooth. CAUSES This condition is caused by a bacterial infection around the root of the tooth that involves the inner part of the tooth (pulp). It may result from:  Severe tooth decay.  Trauma to the tooth that allows bacteria to enter into the pulp, such as a broken or chipped tooth.  Severe gum disease around a tooth. SYMPTOMS Symptoms of this condition include:  Severe pain in and around the infected tooth.  Swelling and redness around the infected tooth, in the mouth, or in the face.  Tenderness.  Pus drainage.  Bad breath.  Bitter taste in the mouth.  Difficulty swallowing.  Difficulty opening the mouth.  Nausea.  Vomiting.  Chills.  Swollen neck glands.  Fever. DIAGNOSIS This condition is diagnosed with examination of the infected tooth. During the exam, your dentist may tap on the infected tooth. Your dentist will also ask about your medical and dental history and may order X-rays. TREATMENT This condition is treated by eliminating the infection. This may be done with:  Antibiotic medicine.  A root canal. This may be performed to save the tooth.  Pulling (extracting) the tooth. This may also involve draining the abscess. This is done if the tooth cannot be saved. HOME CARE INSTRUCTIONS  Take medicines only as directed by your dentist.  If you were prescribed antibiotic medicine, finish all of it even if you start to feel better.  Rinse your mouth (gargle) often with salt water to relieve pain or swelling.  Do not drive or operate  heavy machinery while taking pain medicine.  Do not apply heat to the outside of your mouth.  Keep all follow-up visits as directed by your dentist. This is important. SEEK MEDICAL CARE IF:  Your pain is worse and is not helped by medicine. SEEK IMMEDIATE MEDICAL CARE IF:  You have a fever or chills.  Your symptoms suddenly get worse.  You have a very bad headache.  You have problems breathing or swallowing.  You have trouble opening your mouth.  You have swelling in your neck or around your eye.   This information is not intended to replace advice given to you by your health care provider. Make sure you discuss any questions you have with your health care provider.   Document Released: 08/27/2005 Document Revised: 01/11/2015 Document Reviewed: 08/24/2014 Elsevier Interactive Patient Education 2016 [employer]. [employer] Guide Dental [employer] is a great source of information about community services available.  Access by dialing 2-1-1 from anywhere in [place], or by website -  PooledIncome.pl.   Other Local Resources (Updated 09/2015)  Dental  Care   Services    Phone Number and Address  Cost  [facility] For children 46 - 63 years of age:   Cleaning  Tooth brushing/flossing instruction  Sealants, fillings, crowns  Extractions  Emergency treatment  [phone] [address] [place], [place] [address] Charges based on family income.  Medicaid and some insurance plans accepted.     [facility], fillings, crowns  Extractions  Emergency  treatment [phone] [address] [place], [place]  Pregnant women 58 years of age or older with a Medicaid card  [facility] [place]  Cleaning  Sealants, fillings, crowns  Extractions  Emergency treatment [phone] [address] [place], [place] Pregnant women 48 years of age or older with a Medicaid card  [facility] For children 28 - 34 years of age:   Cleaning  Tooth brushing/flossing instruction  Sealants, fillings, crowns  Extractions  Emergency treatment Limited orthodontic services for patients with Medicaid [phone] [address] [place], [place] [address] Medicaid and [facility] Health Choice cover for children up to age 51 and pregnant women.  Parents of children up to age 69 without Medicaid pay a reduced fee at time of service.  [facility] [employer] For children 45 - 67 years of age:   Cleaning  Tooth brushing/flossing instruction  Sealants, fillings, crowns  Extractions  Emergency treatment Limited orthodontic services for patients with Medicaid [phone] [address] [place], [place].  Medicaid and [place] Health Choice cover for children up to age 35 and pregnant women.  Parents of children up to age 3 without Medicaid pay a reduced fee.  [facility]  Sealants, fillings, crowns  Extractions  Hours: Tuesdays and Thursdays, 4:15 - 8 pm [phone] [address] [place], [place] [address] Services free of charge to [facility] residents ages 18-64 who do not have health insurance, Medicare, [place], or [place] benefits and fall within federal poverty guidelines  [employer]    Provides dental care in addition to primary medical care, nutritional counseling, and pharmacy:  Nurse, mental health, fillings, crowns  Extractions                  [phone] [facility], [address] [place], [place]  [phone] [facility], [address] [place], [place]  [phone] [facility] [place], [place]  [phone] [facility], [address] [place], [place]  [phone] [facility] [address] [place], [place] Accepts [place], [place], most insurance.  Also provides services available to all with fees adjusted based on ability to pay.    [facility]  Cleaning  Tooth brushing/flossing instruction  Sealants, fillings, crowns  Extractions  Emergency treatment Hours: Tuesdays, Thursdays, and Fridays from 8 am to 5 pm by appointment only. [phone] [address] [place], [place] [address] [facility] residents with Medicaid (depending on eligibility) and children with [facility] Health Choice - call for more information.  [facility]  Extractions only  Hours: 2nd and 4th Thursday of each month from 6:30 am - 9 am.   [phone]. [phone] [place], [place] [address] Ages 39 and older only.  Patients are seen on a first come, first served basis.  [employer]  [employer]  Extractions  Orthodontics  Endodontics  Implants/Crowns/Bridges  Complete and partial dentures [phone] [place],  Patients must complete an application for services.  There is often a waiting list.

## 2016-02-03 NOTE — ED Notes (Signed)
Pt called out requesting information about how long CT scan will be. Pt informed that CT will come get patient asap.

## 2016-02-04 ENCOUNTER — Emergency Department (HOSPITAL_COMMUNITY): Payer: Medicare Other

## 2016-02-04 DIAGNOSIS — L0201 Cutaneous abscess of face: Secondary | ICD-10-CM | POA: Diagnosis not present

## 2016-02-04 DIAGNOSIS — J45909 Unspecified asthma, uncomplicated: Secondary | ICD-10-CM

## 2016-02-04 DIAGNOSIS — E785 Hyperlipidemia, unspecified: Secondary | ICD-10-CM | POA: Diagnosis not present

## 2016-02-04 DIAGNOSIS — K047 Periapical abscess without sinus: Secondary | ICD-10-CM | POA: Diagnosis present

## 2016-02-04 DIAGNOSIS — J449 Chronic obstructive pulmonary disease, unspecified: Secondary | ICD-10-CM | POA: Diagnosis not present

## 2016-02-04 DIAGNOSIS — I10 Essential (primary) hypertension: Secondary | ICD-10-CM

## 2016-02-04 LAB — CBC
HCT: 34.3 % — ABNORMAL LOW (ref 39.0–52.0)
Hemoglobin: 10.7 g/dL — ABNORMAL LOW (ref 13.0–17.0)
MCH: 27.7 pg (ref 26.0–34.0)
MCHC: 31.2 g/dL (ref 30.0–36.0)
MCV: 88.9 fL (ref 78.0–100.0)
PLATELETS: 218 10*3/uL (ref 150–400)
RBC: 3.86 MIL/uL — ABNORMAL LOW (ref 4.22–5.81)
RDW: 14 % (ref 11.5–15.5)
WBC: 9.4 10*3/uL (ref 4.0–10.5)

## 2016-02-04 MED ORDER — METOPROLOL TARTRATE 25 MG PO TABS
25.0000 mg | ORAL_TABLET | Freq: Two times a day (BID) | ORAL | Status: DC
  Administered 2016-02-04 (×2): 25 mg via ORAL
  Filled 2016-02-04 (×2): qty 1

## 2016-02-04 MED ORDER — HYDROCODONE-ACETAMINOPHEN 5-325 MG PO TABS
1.0000 | ORAL_TABLET | Freq: Four times a day (QID) | ORAL | Status: DC | PRN
  Administered 2016-02-04 (×2): 2 via ORAL
  Filled 2016-02-04 (×2): qty 2

## 2016-02-04 MED ORDER — AMPICILLIN-SULBACTAM SODIUM 3 (2-1) G IJ SOLR
3.0000 g | Freq: Four times a day (QID) | INTRAMUSCULAR | Status: DC
  Administered 2016-02-04: 3 g via INTRAVENOUS
  Filled 2016-02-04 (×3): qty 3

## 2016-02-04 MED ORDER — ALBUTEROL SULFATE (2.5 MG/3ML) 0.083% IN NEBU: 2.5000 mg | INHALATION_SOLUTION | Freq: Four times a day (QID) | RESPIRATORY_TRACT | Status: DC | PRN

## 2016-02-04 MED ORDER — POTASSIUM CHLORIDE CRYS ER 20 MEQ PO TBCR
40.0000 meq | EXTENDED_RELEASE_TABLET | Freq: Every day | ORAL | Status: DC
  Administered 2016-02-04: 40 meq via ORAL
  Filled 2016-02-04: qty 2

## 2016-02-04 MED ORDER — ONDANSETRON HCL 4 MG PO TABS: 4.0000 mg | ORAL_TABLET | Freq: Four times a day (QID) | ORAL | Status: DC | PRN

## 2016-02-04 MED ORDER — SODIUM CHLORIDE 0.9 % IV SOLN
1.5000 g | Freq: Once | INTRAVENOUS | Status: AC
  Administered 2016-02-04: 1.5 g via INTRAVENOUS
  Filled 2016-02-04: qty 1.5

## 2016-02-04 MED ORDER — IOPAMIDOL (ISOVUE-300) INJECTION 61%
INTRAVENOUS | Status: AC
  Administered 2016-02-04: 75 mL
  Filled 2016-02-04: qty 75

## 2016-02-04 MED ORDER — AMLODIPINE BESYLATE 5 MG PO TABS
5.0000 mg | ORAL_TABLET | Freq: Every day | ORAL | Status: DC
  Administered 2016-02-04: 5 mg via ORAL
  Filled 2016-02-04: qty 1

## 2016-02-04 MED ORDER — ACETAMINOPHEN 325 MG PO TABS: 650.0000 mg | ORAL_TABLET | Freq: Four times a day (QID) | ORAL | Status: DC | PRN

## 2016-02-04 MED ORDER — ACETAMINOPHEN 650 MG RE SUPP: 650.0000 mg | Freq: Four times a day (QID) | RECTAL | Status: DC | PRN

## 2016-02-04 MED ORDER — ENOXAPARIN SODIUM 40 MG/0.4ML ~~LOC~~ SOLN
40.0000 mg | SUBCUTANEOUS | Status: DC
  Filled 2016-02-04: qty 0.4

## 2016-02-04 MED ORDER — IBUPROFEN 200 MG PO TABS: 200.0000 mg | ORAL_TABLET | Freq: Four times a day (QID) | ORAL | Status: DC | PRN

## 2016-02-04 MED ORDER — HYDROCODONE-ACETAMINOPHEN 5-325 MG PO TABS: 1.0000 | ORAL_TABLET | Freq: Four times a day (QID) | ORAL | Status: DC | PRN

## 2016-02-04 MED ORDER — ENSURE ENLIVE PO LIQD
237.0000 mL | Freq: Two times a day (BID) | ORAL | Status: DC
  Administered 2016-02-04: 237 mL via ORAL

## 2016-02-04 MED ORDER — LOSARTAN POTASSIUM 50 MG PO TABS
100.0000 mg | ORAL_TABLET | Freq: Every day | ORAL | Status: DC
  Administered 2016-02-04: 100 mg via ORAL
  Filled 2016-02-04: qty 2

## 2016-02-04 MED ORDER — ATORVASTATIN CALCIUM 20 MG PO TABS
20.0000 mg | ORAL_TABLET | Freq: Every day | ORAL | Status: DC
  Administered 2016-02-04: 20 mg via ORAL
  Filled 2016-02-04: qty 1

## 2016-02-04 MED ORDER — NITROGLYCERIN 0.4 MG SL SUBL: 0.4000 mg | SUBLINGUAL_TABLET | SUBLINGUAL | Status: DC | PRN

## 2016-02-04 MED ORDER — FENTANYL CITRATE (PF) 100 MCG/2ML IJ SOLN
50.0000 ug | Freq: Once | INTRAMUSCULAR | Status: AC
  Administered 2016-02-04: 50 ug via INTRAVENOUS
  Filled 2016-02-04: qty 2

## 2016-02-04 MED ORDER — AMOXICILLIN-POT CLAVULANATE 875-125 MG PO TABS: 1.0000 | ORAL_TABLET | Freq: Two times a day (BID) | ORAL | Status: DC

## 2016-02-04 MED ORDER — CLOPIDOGREL BISULFATE 75 MG PO TABS
75.0000 mg | ORAL_TABLET | Freq: Every day | ORAL | Status: DC
  Administered 2016-02-04: 75 mg via ORAL
  Filled 2016-02-04: qty 1

## 2016-02-04 MED ORDER — SODIUM CHLORIDE 0.9% FLUSH
3.0000 mL | Freq: Two times a day (BID) | INTRAVENOUS | Status: DC
  Administered 2016-02-04: 3 mL via INTRAVENOUS

## 2016-02-04 MED ORDER — ISOSORBIDE MONONITRATE ER 60 MG PO TB24
60.0000 mg | ORAL_TABLET | Freq: Every day | ORAL | Status: DC
  Administered 2016-02-04: 60 mg via ORAL
  Filled 2016-02-04: qty 1

## 2016-02-04 MED ORDER — ONDANSETRON HCL 4 MG/2ML IJ SOLN: 4.0000 mg | Freq: Four times a day (QID) | INTRAMUSCULAR | Status: DC | PRN

## 2016-02-04 MED ORDER — MAGIC MOUTHWASH W/LIDOCAINE
10.0000 mL | Freq: Three times a day (TID) | ORAL | Status: DC | PRN
  Filled 2016-02-04: qty 10

## 2016-02-04 MED ORDER — MOMETASONE FURO-FORMOTEROL FUM 200-5 MCG/ACT IN AERO
2.0000 | INHALATION_SPRAY | Freq: Two times a day (BID) | RESPIRATORY_TRACT | Status: DC
  Administered 2016-02-04: 2 via RESPIRATORY_TRACT
  Filled 2016-02-04: qty 8.8

## 2016-02-04 NOTE — Discharge Summary (Signed)
Physician Discharge Summary  [name] MRN: [id number] DOB/AGE: 73-Mar-1942 73 y.o.  PCP: Rene Paci, MD   Admit date: 02/03/2016 Discharge date: 02/04/2016  Discharge Diagnoses:     Active Problems:   Facial abscess   Dental abscess Coronary artery disease Chronic systolic diastolic heart failure    Follow-up recommendations Follow-up with PCP in 3-5 days , including all  additional recommended appointments as below Follow-up CBC, CMP in 3-5 days Patient needs outpatient dental follow-up      Current Discharge Medication List    START taking these medications   Details  amoxicillin-clavulanate (AUGMENTIN) 875-125 MG tablet Take 1 tablet by mouth 2 (two) times daily. Qty: 20 tablet, Refills: 0      CONTINUE these medications which have CHANGED   Details  HYDROcodone-acetaminophen (NORCO/VICODIN) 5-325 MG tablet Take 1 tablet by mouth every 6 (six) hours as needed for moderate pain or severe pain. Qty: 15 tablet, Refills: 0      CONTINUE these medications which have NOT CHANGED   Details  acetaminophen (TYLENOL) 650 MG CR tablet Take 1,300 mg by mouth every 8 (eight) hours as needed for pain.    albuterol (PROVENTIL HFA;VENTOLIN HFA) 108 (90 BASE) MCG/ACT inhaler Inhale 1 puff into the lungs every 6 (six) hours as needed for wheezing or shortness of breath. Qty: 1 Inhaler, Refills: 6    amLODipine (NORVASC) 5 MG tablet Take 1 tablet (5 mg total) by mouth daily. Qty: 90 tablet, Refills: 3    atorvastatin (LIPITOR) 20 MG tablet Take 1 tablet (20 mg total) by mouth daily. Qty: 30 tablet, Refills: 6    budesonide-formoterol (SYMBICORT) 160-4.5 MCG/ACT inhaler Inhale 2 puffs into the lungs 2 (two) times daily. Qty: 1 Inhaler, Refills: 11    clopidogrel (PLAVIX) 75 MG tablet Take 1 tablet (75 mg total) by mouth daily. Qty: 90 tablet, Refills: 3    isosorbide mononitrate (IMDUR) 60 MG 24 hr tablet Take 1 tablet (60 mg total) by mouth daily. Qty: 90  tablet, Refills: 3    KLOR-CON M20 20 MEQ tablet TAKE 2 TABLETS (40 MEQ TOTAL) BY MOUTH DAILY. Qty: 60 tablet, Refills: 11    losartan (COZAAR) 100 MG tablet Take 1 tablet (100 mg total) by mouth daily. Qty: 30 tablet, Refills: 5    metoprolol tartrate (LOPRESSOR) 25 MG tablet Take 1 tablet (25 mg total) by mouth 2 (two) times daily. Qty: 60 tablet, Refills: 3    nitroGLYCERIN (NITROSTAT) 0.4 MG SL tablet Place 1 tablet (0.4 mg total) under the tongue every 5 (five) minutes as needed for chest pain. Qty: 25 tablet, Refills: 3    penicillin v potassium (VEETID) 500 MG tablet Take 1 tablet (500 mg total) by mouth 4 (four) times daily. Qty: 40 tablet, Refills: 0         Discharge Condition: Stable Discharge Instructions Get Medicines reviewed and adjusted: Please take all your medications with you for your next visit with your Primary MD  Please request your Primary MD to go over all hospital tests and procedure/radiological results at the follow up, please ask your Primary MD to get all Hospital records sent to his/her office.  If you experience worsening of your admission symptoms, develop shortness of breath, life threatening emergency, suicidal or homicidal thoughts you must seek medical attention immediately by calling 911 or calling your MD immediately if symptoms less severe.  You must read complete instructions/literature along with all the possible adverse reactions/side effects for all the Medicines you  take and that have been prescribed to you. Take any new Medicines after you have completely understood and accpet all the possible adverse reactions/side effects.   Do not drive when taking Pain medications.   Do not take more than prescribed Pain, Sleep and Anxiety Medications  Special Instructions: If you have smoked or chewed Tobacco in the last 2 yrs please stop smoking, stop any regular Alcohol and or any Recreational drug use.  Wear Seat belts while  driving.  Please note  You were cared for by a hospitalist during your hospital stay. Once you are discharged, your primary care physician will handle any further medical issues. Please note that NO REFILLS for any discharge medications will be authorized once you are discharged, as it is imperative that you return to your primary care physician (or establish a relationship with a primary care physician if you do not have one) for your aftercare needs so that they can reassess your need for medications and monitor your lab values.  Discharge Instructions    Diet - low sodium heart healthy    Complete by:  As directed      Increase activity slowly    Complete by:  As directed             Allergies  Allergen Reactions  . Acyclovir And Related     unknown  . Aspirin Other (See Comments)    GI upset at high doses only.      Disposition: 01-Home or Self Care   Consults:  None     Significant Diagnostic Studies:  Ct Maxillofacial W/cm  03-05-2016  CLINICAL DATA:  Acute onset of upper dental pain and swelling. Assess for abscess. Initial encounter. EXAM: CT MAXILLOFACIAL WITH CONTRAST TECHNIQUE: Multidetector CT imaging of the maxillofacial structures was performed with intravenous contrast. Multiplanar CT image reconstructions were also generated. A small metallic BB was placed on the right temple in order to reliably differentiate right from left. CONTRAST:  75mL ISOVUE-300 IOPAMIDOL (ISOVUE-300) INJECTION 61% COMPARISON:  CT of the head performed 05/23/2008 FINDINGS: Prominent soft tissue swelling is noted overlying the right mandible and overlying the central maxilla, with a 1.9 x 1.6 x 0.8 cm soft tissue abscess overlying the right central maxilla. There is no evidence of fracture or dislocation. The maxilla and mandible appear intact. The nasal bone is unremarkable in appearance. Numerous large maxillary and mandibular dental caries are noted. Dental caries involve all of the  visualized maxillary teeth. A periapical abscess is noted at the root of the right second mandibular premolar, and lucency is noted about the root of the left lateral mandibular incisor. A periapical abscess is also suggested at the root of the left central maxillary incisor, with cortical breakthrough, and mild loosening is noted about the roots of the remaining bilateral maxillary premolars. The orbits are intact bilaterally. Mucosal thickening is noted at the right maxillary sinus, and a small mucus retention cyst or polyp is noted at the left maxillary sinus. The remaining visualized paranasal sinuses and mastoid air cells are well-aerated. Soft tissue swelling is noted inferior to the right orbit. The parapharyngeal fat planes are preserved. The nasopharynx, oropharynx and hypopharynx are unremarkable in appearance. The visualized portions of the valleculae and piriform sinuses are grossly unremarkable. Minimal calcification is seen at the carotid bifurcations bilaterally. The parotid and submandibular glands are within normal limits. No cervical lymphadenopathy is seen. The visualized portions of the brain are unremarkable, aside from mild cerebellar atrophy. IMPRESSION: 1. 1.9  x 1.6 x 0.8 cm soft tissue abscess overlying the right central maxilla. 2. Prominent soft tissue swelling overlying the central maxilla and right mandible, and inferior to the right orbit. 3. Periapical abscess at the root of the left central maxillary incisor, with cortical breakthrough. This may correspond to the soft tissue abscess. 4. Periapical abscess at the root of the right second mandibular premolar, and lucency about the root of the left lateral mandibular incisor. 5. Numerous large maxillary and mandibular dental caries noted; dental caries involve all of the visualized maxillary teeth. Mild loosening noted about the roots of the remaining bilateral maxillary premolars. 6. Mucosal thickening at the right maxillary sinus, and  small mucus retention cyst or polyp at the left maxillary sinus. 7. Minimal calcification at the carotid bifurcations bilaterally. 8. Mild cerebellar atrophy noted. Electronically Signed   By: Roanna Raider M.D.   On: 02/04/2016 01:05        Filed Weights   02/03/16 2120  Weight: 68.947 kg (152 lb)     Microbiology: No results found for this or any previous visit (from the past 240 hour(s)).     Blood Culture    Component Value Date/Time   SDES BLOOD RIGHT HAND 05/08/2014 1632   SPECREQUEST BOTTLES DRAWN AEROBIC ONLY 8CC 05/08/2014 1632   CULT  05/08/2014 1632    NO GROWTH 5 DAYS Performed at [facility]   REPTSTATUS 05/14/2014 FINAL 05/08/2014 1632      Labs: Results for orders placed or performed during the hospital encounter of 02/03/16 (from the past 48 hour(s))  CBC with Differential     Status: Abnormal   Collection Time: 02/03/16 10:20 PM  Result Value Ref Range   WBC 9.7 4.0 - 10.5 K/uL   RBC 4.40 4.22 - 5.81 MIL/uL   Hemoglobin 12.3 (L) 13.0 - 17.0 g/dL   HCT 16.1 (L) 09.6 - 04.5 %   MCV 87.0 78.0 - 100.0 fL   MCH 28.0 26.0 - 34.0 pg   MCHC 32.1 30.0 - 36.0 g/dL   RDW 40.9 81.1 - 91.4 %   Platelets 229 150 - 400 K/uL   Neutrophils Relative % 73 %   Neutro Abs 7.0 1.7 - 7.7 K/uL   Lymphocytes Relative 16 %   Lymphs Abs 1.5 0.7 - 4.0 K/uL   Monocytes Relative 11 %   Monocytes Absolute 1.1 (H) 0.1 - 1.0 K/uL   Eosinophils Relative 0 %   Eosinophils Absolute 0.0 0.0 - 0.7 K/uL   Basophils Relative 0 %   Basophils Absolute 0.0 0.0 - 0.1 K/uL  I-stat Chem 8, ED     Status: None   Collection Time: 02/03/16 10:26 PM  Result Value Ref Range   Sodium 142 135 - 145 mmol/L   Potassium 4.0 3.5 - 5.1 mmol/L   Chloride 105 101 - 111 mmol/L   BUN 11 6 - 20 mg/dL   Creatinine, Ser 7.82 0.61 - 1.24 mg/dL   Glucose, Bld 99 65 - 99 mg/dL   Calcium, Ion 9.56 2.13 - 1.30 mmol/L   TCO2 26 0 - 100 mmol/L   Hemoglobin 13.9 13.0 - 17.0 g/dL   HCT 08.6 57.8 -  46.9 %  CBC     Status: Abnormal   Collection Time: 02/04/16  5:03 AM  Result Value Ref Range   WBC 9.4 4.0 - 10.5 K/uL   RBC 3.86 (L) 4.22 - 5.81 MIL/uL   Hemoglobin 10.7 (L) 13.0 - 17.0 g/dL  Comment: DELTA CHECK NOTED REPEATED TO VERIFY    HCT 34.3 (L) 39.0 - 52.0 %   MCV 88.9 78.0 - 100.0 fL   MCH 27.7 26.0 - 34.0 pg   MCHC 31.2 30.0 - 36.0 g/dL   RDW 16.1 09.6 - 04.5 %   Platelets 218 150 - 400 K/uL     Lipid Panel     Component Value Date/Time   CHOL 202* 11/29/2015 0908   TRIG 86 11/29/2015 0908   HDL 70 11/29/2015 0908   CHOLHDL 2.9 11/29/2015 0908   VLDL 17 11/29/2015 0908   LDLCALC 115 11/29/2015 0908   LDLDIRECT 132.9 06/18/2013 1038     No results found for: HGBA1C   Lab Results  Component Value Date   LDLCALC 115 11/29/2015   CREATININE 0.80 02/03/2016     HPI :\ 75 y.o. 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 Symptoms initially started 4 -5 days ago. Patient reports having pain in the frontal part of his teeth. He has not been to a dentist since some time. Notes having multiple broken and deteriorated teeth. Previously evaluated in the emergency department 2 days ago and was suspected to have dental abscess prescribed penicillin V and hydrocodone and discharged home. Patient notes that he was taking antibiotics as prescribed along with the pain medication, buth had continued swelling of his face. Associated symptoms include decreased oral intake secondary to pain. Patient only reports being able to tolerate soups. He return to the ED because symptoms just are not getting better and he wants someone to fix his infection and control of pain. Denies any nausea, vomiting, fever, chills, or worsening shortness of breath.  ED Course: upon admission to the emergency department patient is evaluated and seen to be  afebrile , blood pressure as high as 189/75, and all other vital signs within normal limits. A maxillofacial CT scan Revealed multiple soft tissue abscesses withdental caries. Patient will start him on Unasyn in the ED. Discuss with patient that we can try and treat the infection with antibiotics and control pain, but normally do not readily have access to a dentist as an inpatient.thereafter explained thatto resolve problem completely he must be seen by a dentist and this is only temporary treatment.  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 rate 17, weight 68.947 kg (152 lb), SpO2 93 %.      Follow-up Information    Follow up with Rene Paci, MD. Schedule an appointment as soon as possible for a visit in 3 days.   Specialty:  Internal Medicine   Why:  Hospital follow-up   Contact information:   [address] [place] [address] [phone]  SignedRicharda Overlie 02/04/2016, 12:14 PM        Time spent >45 mins

## 2016-02-04 NOTE — Progress Notes (Signed)
Patient seen and examined  75 y.o. 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 A maxillofacial CT scan Revealed multiple soft tissue abscesses withdental caries. Patient will start him on Unasyn in the ED.  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

## 2016-02-04 NOTE — Progress Notes (Signed)
Nutrition Brief Note  Patient identified on the Malnutrition Screening Tool (MST) Report  Wt Readings from Last 15 Encounters:  02/03/16 152 lb (68.947 kg)  02/02/16 150 lb (68.04 kg)  01/31/16 152 lb (68.947 kg)  11/15/15 153 lb 3.2 oz (69.491 kg)  02/22/15 150 lb (68.04 kg)  01/31/15 154 lb 6.4 oz (70.035 kg)  01/26/15 151 lb 12.8 oz (68.856 kg)  01/24/15 151 lb 9.6 oz (68.765 kg)  12/07/14 147 lb (66.679 kg)  11/22/14 149 lb 6.4 oz (67.767 kg)  08/25/14 143 lb 6.4 oz (65.046 kg)  08/13/14 141 lb 6.4 oz (64.139 kg)  07/30/14 145 lb 8 oz (65.998 kg)  06/21/14 143 lb (64.864 kg)  06/20/14 144 lb (65.318 kg)    Body mass index is 23.12 kg/(m^2). Patient meets criteria for weight to height based on current BMI.   Pt was not very interested about talking about nutrition. He was frustrated/upset he has not yet been discharged. He says he can eat fine now and wants to go home and has debated leaving. Acted as active Engineer, mining until physician arrived.   Current diet order is Regular, patient is consuming approximately 75% of meals at this time. Expect imminent discharge.   No nutrition interventions warranted at this time. If nutrition issues arise, please consult RD.   Christophe Louis RD, LDN Clinical Nutrition Pager: [phone] 02/04/2016 12:10 PM

## 2016-02-04 NOTE — Progress Notes (Signed)
IV taken out. Doctor's note given to pt. Pt. D/c'd successfully.

## 2016-02-04 NOTE — Progress Notes (Signed)
Discharge papers gone over with pt. Prescription given to pt. NO questions/complaints. Pt. Needs doctor's note: MD made aware.

## 2016-02-04 NOTE — H&P (Addendum)
History and Physical    CHAU SAWIN HQP:[id number] DOB: [birth year] DOA: 02/03/2016  Referring MD/NP/PA:  PCP: Rene Paci, MD  Patient coming from: Home  Chief Complaint: dental pain and facial swelling  HPI: [name] is a 75 y.o. 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  Symptoms initially started 4 -5 days ago. Patient reports having pain in the frontal part of his teeth. He has not been to a dentist since some time. Notes having multiple broken and deteriorated teeth. Previously evaluated in the emergency department 2 days ago and was suspected to have dental abscess prescribed  penicillin V and hydrocodone and discharged home. Patient notes that he was taking antibiotics as prescribed along with the pain medication, buth had continued swelling of his face.  Associated symptoms include decreased oral intake secondary to pain. Patient only reports being able to tolerate soups. He return to the ED because symptoms just are not getting better and he wants someone to fix his infection and  control of pain. Denies any nausea, vomiting, fever, chills, or worsening shortness of breath.  ED Course: upon admission to the emergency department patient is evaluated and seen to be afebrile , blood pressure as high as 189/75, and all other vital signs within normal limits. A maxillofacial CT scan  Revealed multiple  soft tissue abscesses withdental caries. Patient will start him on Unasyn in the ED. Discuss with patient that  we can try and treat the infection with antibiotics and control pain, but normally do not readily have access to a dentist as an inpatient.thereafter explained thatto resolve problem completely he must be seen by a dentist and this is only temporary treatment.  Review of Systems: As per HPI otherwise 10 point review of systems  negative.   Past Medical History  Diagnosis Date  . Dyslipidemia     a. on statin  . LBBB (left bundle branch block)   . CAD (coronary artery disease)     a. s/p stent RCA DES 9/09; b. 2014 Attempted PCI of OM1 @ [place];  c. 04/2014 Cath: LAD 40-50p, D1 95-99 (chronic), LCX 30-40 inf branch, OM1 CTO, RCA 30-40p, RCA patent stent, EF 35%->Med Rx.  . HTN (hypertension)     a. Reports intolerance to hydralazine; b. no beta blockers 2/2 bradycardia;  c. failed on ACE and ARB.  [name] Noncompliance   . Chronic combined systolic and diastolic CHF (congestive heart failure) (HCC)     a. EF about 40 to 45% per echo in April 2013;  b. 04/2014 Echo: EF 20-25%, sev LVH, sev glob HK, Gr 1 DD, mildly reduced RV fxn, PASP .  [name] LVH (left ventricular hypertrophy)   . COPD (chronic obstructive pulmonary disease) (HCC) dx 06/2013    PFTs 07/08/13: mod obst with resp to bronchodilator, moderate decrease diffusion, airtrapping  . Chronic low back pain   . Mixed Ischemic/Non-ischemic Cardiomyopathy     a. 04/2014 Echo: EF 20-25%, sev glob HK.  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 NSVT (nonsustained ventricular tachycardia) (HCC)   . HLD (hyperlipidemia)   . Paroxysmal atrial fibrillation (HCC)     a. identified on device interrogation 01/2016    Past Surgical History  Procedure Laterality Date  . Cardiac catheterization      ejection fraction 50%  . Left heart catheterization with coronary angiogram N/A  05/08/2014    Procedure: LEFT HEART CATHETERIZATION WITH CORONARY ANGIOGRAM;  Surgeon: Micheline Chapman, MD;  Location: [facility] CATH LAB;  Service: Cardiovascular;  Laterality: N/A;  . Bi-ventricular implantable cardioverter defibrillator N/A 08/12/2014    MDT Ovidio Kin XT CRTD implanted by Dr Johney Frame     reports that he quit smoking about 3 years ago. His smoking use included Cigarettes. He has a 28 pack-year smoking history. He does not have any smokeless tobacco history on file. He reports that he  does not drink alcohol or use illicit drugs.  Allergies  Allergen Reactions  . Acyclovir And Related     unknown  . Aspirin Other (See Comments)    GI upset at high doses only.    Family History  Problem Relation Age of Onset  . Heart attack Neg Hx   . Stroke Neg Hx     Prior to Admission medications   Medication Sig Start Date End Date Taking? Authorizing Provider  acetaminophen (TYLENOL) 650 MG CR tablet Take 1,300 mg by mouth every 8 (eight) hours as needed for pain.    Historical Provider, MD  albuterol (PROVENTIL HFA;VENTOLIN HFA) 108 (90 BASE) MCG/ACT inhaler Inhale 1 puff into the lungs every 6 (six) hours as needed for wheezing or shortness of breath. 11/26/14   Peter M [place], MD  amLODipine (NORVASC) 5 MG tablet Take 1 tablet (5 mg total) by mouth daily. 01/31/15   Hillis Range, MD  atorvastatin (LIPITOR) 20 MG tablet Take 1 tablet (20 mg total) by mouth daily. 12/07/15   Peter M [place], MD  budesonide-formoterol [facility]) 160-4.5 MCG/ACT inhaler Inhale 2 puffs into the lungs 2 (two) times daily. 02/22/15   Lupita Leash, MD  clopidogrel (PLAVIX) 75 MG tablet Take 1 tablet (75 mg total) by mouth daily. 11/15/15   Peter M [place], MD  HYDROcodone-acetaminophen (NORCO/VICODIN) 5-325 MG tablet Take 1 tablet by mouth every 6 (six) hours as needed for moderate pain or severe pain. 02/03/16   Lavera Guise, MD  isosorbide mononitrate (IMDUR) 60 MG 24 hr tablet Take 1 tablet (60 mg total) by mouth daily. 11/15/15   Peter M [place], MD  KLOR-CON M20 20 MEQ tablet TAKE 2 TABLETS (40 MEQ TOTAL) BY MOUTH DAILY. 08/11/15   Beatrice Lecher, PA-C  losartan (COZAAR) 100 MG tablet Take 1 tablet (100 mg total) by mouth daily. 09/10/15   Peter M [place], MD  metoprolol tartrate (LOPRESSOR) 25 MG tablet Take 1 tablet (25 mg total) by mouth 2 (two) times daily. 01/31/16   Amber Caryl Bis, NP  nitroGLYCERIN (NITROSTAT) 0.4 MG SL tablet Place 1 tablet (0.4 mg total) under the tongue every 5 (five) minutes as  needed for chest pain. 01/31/16 07/19/17  Amber Caryl Bis, NP  penicillin v potassium (VEETID) 500 MG tablet Take 1 tablet (500 mg total) by mouth 4 (four) times daily. 02/03/16 02/10/16  Lavera Guise, MD    Physical Exam: Filed Vitals:   02/03/16 2120 02/04/16 0128 02/04/16 0211  BP: 189/75 170/82 168/68  Pulse: 88 78 80  Temp: 99.4 F (37.4 C)  99 F (37.2 C)  TempSrc: Oral  Oral  Resp: Weight: 68.947 kg (152 lb)    SpO2: 95% 98% 96%      Constitutional: NAD, calm, comfortable Filed Vitals:   02/03/16 2120 02/04/16 0128 02/04/16 0211  BP: 189/75 170/82 168/68  Pulse: 88 78 80  Temp: 99.4 F (37.4 C)  99 F (37.2  C)  TempSrc: Oral  Oral  Resp: 20 18 16   Weight: 68.947 kg (152 lb)    SpO2: 95% 98% 96%   Eyes: PERRL, lids and conjunctivae normal ENMT: Mucous membranes are moist Poor oral dentition with multiple dental caries. Swelling and erythema of the upper palate patient mostly adentulous, but there is note of some fractured teeth noted. Neck: normal, supple, no masses, no thyromegaly Respiratory: Regular respiratory effort, Mild wheeze present, no crackles. Normal respiratory effort. No accessory muscle use.  Cardiovascular: Regular rate and rhythm, no murmurs / rubs / gallops. No extremity edema. 2+ pedal pulses. No carotid bruits.  Abdomen: no tenderness, no masses palpated. No hepatosplenomegaly. Bowel sounds positive.  Musculoskeletal: no clubbing / cyanosis. No joint deformity upper and lower extremities. Good ROM, no contractures. Normal muscle tone.  Skin: no rashes, lesions, ulcers. No induration Neurologic: CN 2-12 grossly intact. Sensation intact, DTR normal. Strength 5/5 in all 4.  Psychiatric: Normal judgment and insight. Alert and oriented x 3. Normal mood.     Labs on Admission: I have personally reviewed following labs and imaging studies  CBC:  Recent Labs Lab 01/31/16 0956 02/03/16 2220 02/03/16 2226  WBC 6.8 9.7  --   NEUTROABS  --   7.0  --   HGB 12.6* 12.3* 13.9  HCT 38.7 38.3* 41.0  MCV 86.8 87.0  --   PLT 195 229  --    Basic Metabolic Panel:  Recent Labs Lab 01/31/16 0956 02/03/16 2226  NA 142 142  K 3.9 4.0  CL 108 105  CO2 26  --   GLUCOSE 96 99  BUN 8 11  CREATININE 0.79 0.80  CALCIUM 9.2  --   MG 1.8  --    GFR: Estimated Creatinine Clearance: 80.8 mL/min (by C-G formula based on Cr of 0.8). Liver Function Tests: No results for input(s): AST, ALT, ALKPHOS, BILITOT, PROT, ALBUMIN in the last 168 hours. No results for input(s): LIPASE, AMYLASE in the last 168 hours. No results for input(s): AMMONIA in the last 168 hours. Coagulation Profile: No results for input(s): INR, PROTIME in the last 168 hours. Cardiac Enzymes: No results for input(s): CKTOTAL, CKMB, CKMBINDEX, TROPONINI in the last 168 hours. BNP (last 3 results) No results for input(s): PROBNP in the last 8760 hours. HbA1C: No results for input(s): HGBA1C in the last 72 hours. CBG: No results for input(s): GLUCAP in the last 168 hours. Lipid Profile: No results for input(s): CHOL, HDL, LDLCALC, TRIG, CHOLHDL, LDLDIRECT in the last 72 hours. Thyroid Function Tests: No results for input(s): TSH, T4TOTAL, FREET4, T3FREE, THYROIDAB in the last 72 hours. Anemia Panel: No results for input(s): VITAMINB12, FOLATE, FERRITIN, TIBC, IRON, RETICCTPCT in the last 72 hours. Urine analysis:    Component Value Date/Time   COLORURINE YELLOW 05/30/2014 1343   APPEARANCEUR CLEAR 05/30/2014 1343   LABSPEC 1.010 05/30/2014 1343   PHURINE 5.0 05/30/2014 1343   GLUCOSEU NEGATIVE 05/30/2014 1343   GLUCOSEU NEGATIVE 06/18/2013 1038   HGBUR NEGATIVE 05/30/2014 1343   BILIRUBINUR NEGATIVE 05/30/2014 1343   KETONESUR NEGATIVE 05/30/2014 1343   PROTEINUR NEGATIVE 05/30/2014 1343   UROBILINOGEN 0.2 05/30/2014 1343   NITRITE NEGATIVE 05/30/2014 1343   LEUKOCYTESUR NEGATIVE 05/30/2014 1343   Sepsis Labs: No results found for this or any previous  visit (from the past 240 hour(s)).   Radiological Exams on Admission: Ct Maxillofacial W/cm  02/04/2016  CLINICAL DATA:  Acute onset of upper dental pain and swelling. Assess for abscess. Initial  encounter. EXAM: CT MAXILLOFACIAL WITH CONTRAST TECHNIQUE: Multidetector CT imaging of the maxillofacial structures was performed with intravenous contrast. Multiplanar CT image reconstructions were also generated. A small metallic BB was placed on the right temple in order to reliably differentiate right from left. CONTRAST:  75mL ISOVUE-300 IOPAMIDOL (ISOVUE-300) INJECTION 61% COMPARISON:  CT of the head performed 05/23/2008 FINDINGS: Prominent soft tissue swelling is noted overlying the right mandible and overlying the central maxilla, with a 1.9 x 1.6 x 0.8 cm soft tissue abscess overlying the right central maxilla. There is no evidence of fracture or dislocation. The maxilla and mandible appear intact. The nasal bone is unremarkable in appearance. Numerous large maxillary and mandibular dental caries are noted. Dental caries involve all of the visualized maxillary teeth. A periapical abscess is noted at the root of the right second mandibular premolar, and lucency is noted about the root of the left lateral mandibular incisor. A periapical abscess is also suggested at the root of the left central maxillary incisor, with cortical breakthrough, and mild loosening is noted about the roots of the remaining bilateral maxillary premolars. The orbits are intact bilaterally. Mucosal thickening is noted at the right maxillary sinus, and a small mucus retention cyst or polyp is noted at the left maxillary sinus. The remaining visualized paranasal sinuses and mastoid air cells are well-aerated. Soft tissue swelling is noted inferior to the right orbit. The parapharyngeal fat planes are preserved. The nasopharynx, oropharynx and hypopharynx are unremarkable in appearance. The visualized portions of the valleculae and piriform  sinuses are grossly unremarkable. Minimal calcification is seen at the carotid bifurcations bilaterally. The parotid and submandibular glands are within normal limits. No cervical lymphadenopathy is seen. The visualized portions of the brain are unremarkable, aside from mild cerebellar atrophy. IMPRESSION: 1. 1.9 x 1.6 x 0.8 cm soft tissue abscess overlying the right central maxilla. 2. Prominent soft tissue swelling overlying the central maxilla and right mandible, and inferior to the right orbit. 3. Periapical abscess at the root of the left central maxillary incisor, with cortical breakthrough. This may correspond to the soft tissue abscess. 4. Periapical abscess at the root of the right second mandibular premolar, and lucency about the root of the left lateral mandibular incisor. 5. Numerous large maxillary and mandibular dental caries noted; dental caries involve all of the visualized maxillary teeth. Mild loosening noted about the roots of the remaining bilateral maxillary premolars. 6. Mucosal thickening at the right maxillary sinus, and small mucus retention cyst or polyp at the left maxillary sinus. 7. Minimal calcification at the carotid bifurcations bilaterally. 8. Mild cerebellar atrophy noted. Electronically Signed   By: Roanna Raider M.D.   On: 02/04/2016 01:05     Assessment/Plan Dental abscess with dental pain: Acute. Patient reports worsening dental pain and facial swelling despite taking once day of antibiotics and pain medication. Discuss need of patient to be seen by dentist as soon as possible to correct problem. - Admit to a MedSurg bed - Antibiotics of IV Unasyn per pharmacy - Hydrocodone prn pain - Magic mouthwash with lidocaine prn   Essential hypertension - Continue Cozaar, amlodipine  CAD s/p PCI - Continue Plavix  COPD:  - continue home regimen of breathing treatments   Hyperlipidemia  - Continue atorvastatin    DVT prophylaxis: Lovenox Code Status: Full Family  Communication: None Disposition Plan: possible discharge in a.m. Consults called: none Admission status: observation MedSurg  Clydie Braun MD [facility] Pager [phone]  If 7PM-7AM, please  contact night-coverage [email or web address] Password [facility]  02/04/2016, 2:33 AM

## 2016-02-05 ENCOUNTER — Other Ambulatory Visit: Payer: Self-pay | Admitting: Internal Medicine

## 2016-02-08 ENCOUNTER — Telehealth: Payer: Self-pay | Admitting: *Deleted

## 2016-02-08 NOTE — Telephone Encounter (Signed)
-----   Message from Marily Lente, NP sent at 01/31/2016  7:01 PM EDT ----- Please notify patient of stable labs

## 2016-02-15 ENCOUNTER — Telehealth (HOSPITAL_COMMUNITY): Payer: Self-pay | Admitting: *Deleted

## 2016-02-15 NOTE — Telephone Encounter (Signed)
Patient given detailed instructions per Myocardial Perfusion Study Information Sheet for the test on 02/20/16. Patient notified to arrive 15 minutes early and that it is imperative to arrive on time for appointment to keep from having the test rescheduled.  If you need to cancel or reschedule your appointment, please call the office within 24 hours of your appointment. Failure to do so may result in a cancellation of your appointment, and a $50 no show fee. Patient verbalized understanding. Jassica Zazueta J Cheyeanne Roadcap, RN  

## 2016-02-20 ENCOUNTER — Other Ambulatory Visit: Payer: Self-pay

## 2016-02-20 ENCOUNTER — Ambulatory Visit (HOSPITAL_BASED_OUTPATIENT_CLINIC_OR_DEPARTMENT_OTHER): Payer: Medicare Other

## 2016-02-20 ENCOUNTER — Ambulatory Visit (HOSPITAL_COMMUNITY): Payer: Medicare Other | Attending: Cardiovascular Disease

## 2016-02-20 DIAGNOSIS — I4891 Unspecified atrial fibrillation: Secondary | ICD-10-CM | POA: Diagnosis present

## 2016-02-20 DIAGNOSIS — E785 Hyperlipidemia, unspecified: Secondary | ICD-10-CM | POA: Diagnosis not present

## 2016-02-20 DIAGNOSIS — R079 Chest pain, unspecified: Secondary | ICD-10-CM | POA: Diagnosis not present

## 2016-02-20 DIAGNOSIS — I110 Hypertensive heart disease with heart failure: Secondary | ICD-10-CM | POA: Diagnosis not present

## 2016-02-20 DIAGNOSIS — I5022 Chronic systolic (congestive) heart failure: Secondary | ICD-10-CM

## 2016-02-20 DIAGNOSIS — R0602 Shortness of breath: Secondary | ICD-10-CM

## 2016-02-20 DIAGNOSIS — R55 Syncope and collapse: Secondary | ICD-10-CM

## 2016-02-20 DIAGNOSIS — I447 Left bundle-branch block, unspecified: Secondary | ICD-10-CM | POA: Diagnosis not present

## 2016-02-20 DIAGNOSIS — I2510 Atherosclerotic heart disease of native coronary artery without angina pectoris: Secondary | ICD-10-CM | POA: Insufficient documentation

## 2016-02-20 DIAGNOSIS — R9439 Abnormal result of other cardiovascular function study: Secondary | ICD-10-CM | POA: Diagnosis not present

## 2016-02-20 DIAGNOSIS — I340 Nonrheumatic mitral (valve) insufficiency: Secondary | ICD-10-CM | POA: Insufficient documentation

## 2016-02-20 LAB — MYOCARDIAL PERFUSION IMAGING
CHL CUP NUCLEAR SDS: 0
CHL CUP NUCLEAR SRS: 9
CSEPPHR: 78 {beats}/min
LV sys vol: 87 mL
LVDIAVOL: 143 mL (ref 62–150)
NUC STRESS TID: 0.98
RATE: 0.15
Rest HR: 74 {beats}/min
SSS: 9

## 2016-02-20 MED ORDER — REGADENOSON 0.4 MG/5ML IV SOLN
0.4000 mg | Freq: Once | INTRAVENOUS | Status: AC
  Administered 2016-02-20: 0.4 mg via INTRAVENOUS

## 2016-02-20 MED ORDER — TECHNETIUM TC 99M TETROFOSMIN IV KIT
31.7000 | PACK | Freq: Once | INTRAVENOUS | Status: AC | PRN
  Administered 2016-02-20: 31.7 via INTRAVENOUS
  Filled 2016-02-20: qty 32

## 2016-02-20 MED ORDER — TECHNETIUM TC 99M TETROFOSMIN IV KIT
10.9000 | PACK | Freq: Once | INTRAVENOUS | Status: AC | PRN
  Administered 2016-02-20: 10.9 via INTRAVENOUS
  Filled 2016-02-20: qty 11

## 2016-02-23 ENCOUNTER — Other Ambulatory Visit: Payer: Self-pay | Admitting: Internal Medicine

## 2016-02-28 ENCOUNTER — Encounter: Payer: Self-pay | Admitting: *Deleted

## 2016-02-28 ENCOUNTER — Encounter: Payer: Self-pay | Admitting: Physician Assistant

## 2016-02-28 NOTE — Progress Notes (Signed)
    Cardiology Office Note   Date:  02/28/2016   ID:  Vincent, Brewer [birth year], MRN [id number]  PCP:  Rene Paci, MD  Cardiologist:  Dr [place], Dr Dillard Cannon, Bjorn Loser, PA-C   No chief complaint on file.   History of Present Illness: [name] is a 75 y.o. male with a history of DES RCA 2009, D1 99% (med rx), CTO OM, EF 35% 2015 cath in [place], LBBB, dyslipidemia, HTN, S-D-CHF, COPD, NSVT, PAF, syncope, s/p MDT ICD. Not on anticoag, CHADS2VASC=5 (DM, HTN, CHF, CAD, age x1), plan was to discuss w/ Dr Sw[place]iland prior to starting.  D/c 05/27 after admit for CHF. MV (ordered after o.v w/ A.Seiler 05/23) done 06/12 was intermediate risk.  This encounter was created in error - please disregard.

## 2016-04-06 ENCOUNTER — Other Ambulatory Visit: Payer: Self-pay | Admitting: Pulmonary Disease

## 2016-04-09 ENCOUNTER — Encounter: Payer: Self-pay | Admitting: Physician Assistant

## 2016-04-09 ENCOUNTER — Telehealth: Payer: Self-pay | Admitting: Internal Medicine

## 2016-04-09 ENCOUNTER — Ambulatory Visit (INDEPENDENT_AMBULATORY_CARE_PROVIDER_SITE_OTHER): Payer: Medicare Other | Admitting: Physician Assistant

## 2016-04-09 VITALS — BP 164/76 | HR 64 | Ht 68.0 in | Wt 151.0 lb

## 2016-04-09 DIAGNOSIS — I10 Essential (primary) hypertension: Secondary | ICD-10-CM | POA: Diagnosis not present

## 2016-04-09 DIAGNOSIS — I5022 Chronic systolic (congestive) heart failure: Secondary | ICD-10-CM | POA: Diagnosis not present

## 2016-04-09 DIAGNOSIS — I25118 Atherosclerotic heart disease of native coronary artery with other forms of angina pectoris: Secondary | ICD-10-CM

## 2016-04-09 DIAGNOSIS — I2510 Atherosclerotic heart disease of native coronary artery without angina pectoris: Secondary | ICD-10-CM

## 2016-04-09 NOTE — Patient Instructions (Addendum)
Medications  No Change   Lab work  Your physician recommends that you return for lab work today at [facility]. (Appointment needed)    Follow-up  Send Implantable device transmission in 3 months and follow up with Dr. Johney Frame in 1 year.  Your physician recommends that you schedule a follow-up appointment in: 1 month with Noralyn Pick, PA-C or Dr. Swaziland.  If you need a refill on your cardiac medications before your next appointment, please call your pharmacy.

## 2016-04-09 NOTE — Progress Notes (Addendum)
Cardiology Office Note   Date:  04/09/2016   ID:  [name], DOB 12/13/1940, MRN [id number]  PCP:  Rene Paci, MD  Cardiologist:  Dr [place]aziland  Barrett, Rhonda, PA-C   Chief Complaint  Patient presents with  . Follow-up    F/U ater stress test/echo  . Shortness of Breath  . Dizziness    History of Present Illness: [name] is a 75 y.o. 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  [name] presents for f/u after the above testing.   He denies chest pain or SOB at rest. He can walk about 200 feet without stopping to rest. He has no problems with LE edema or orthopnea. However, he has occasional PND. He rarely gets chest pain. He has recently been having some congestion and has been taking Tylenol for this.   He has cramping in his hands and in his feet and calves. He works nights and the cramping happens at the end of the day.   He is not on a diuretic, but states he is urinating regularly and does not eat excessive salt.    Past Medical History:  Diagnosis Date  . CAD (coronary artery disease)    a. s/p stent RCA DES 9/09; b. 2014 Attempted PCI of OM1 @ [place];  c. 04/2014 Cath: LAD 40-50p, D1 95-99 (chronic), LCX 30-40 inf branch, OM1 CTO, RCA 30-40p, RCA patent stent, EF 35%->Med Rx.  . Chronic combined systolic and diastolic CHF (congestive heart failure) (HCC)    a. EF about 40 to 45% per echo in April 2013;  b. 04/2014 Echo: EF 20-25%, sev LVH, sev glob HK, Gr 1 DD, mildly reduced RV fxn, PASP .  [name] Chronic low back pain   . COPD (chronic obstructive pulmonary disease) (HCC) dx 06/2013   PFTs 07/08/13: mod obst with resp to bronchodilator, moderate decrease diffusion, airtrapping  . Dyslipidemia    a. on statin  . HLD (hyperlipidemia)   . HTN (hypertension)    a. Reports intolerance to hydralazine; b. no  beta blockers 2/2 bradycardia;  c. failed on ACE and ARB.  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 LBBB (left bundle branch block)   . LVH (left ventricular hypertrophy)   . Mixed Ischemic/Non-ischemic Cardiomyopathy    a. 04/2014 Echo: EF 20-25%, sev glob HK.  [name] Noncompliance   . NSVT (nonsustained ventricular tachycardia) (HCC)   . Paroxysmal atrial fibrillation (HCC)    a. identified on device interrogation 01/2016    Past Surgical History:  Procedure Laterality Date  . BI-VENTRICULAR IMPLANTABLE CARDIOVERTER DEFIBRILLATOR N/A 08/12/2014   MDT Ovidio Kin XT CRTD implanted by Dr Johney Frame  . CARDIAC CATHETERIZATION     ejection fraction 50%  . LEFT HEART CATHETERIZATION WITH CORONARY ANGIOGRAM N/A 05/08/2014   Procedure: LEFT HEART CATHETERIZATION WITH CORONARY ANGIOGRAM;  Surgeon: Micheline Chapman, MD;  Location: [facility] CATH LAB;  Service: Cardiovascular;  Laterality: N/A;    Current Outpatient Prescriptions  Medication Sig Dispense Refill  . acetaminophen (TYLENOL) 650 MG CR tablet Take 1,300 mg by mouth every 8 (eight) hours as needed for pain.    [name] albuterol (PROVENTIL HFA;VENTOLIN HFA) 108 (90 BASE) MCG/ACT inhaler Inhale 1 puff into the lungs every 6 (six) hours as needed for wheezing or  shortness of breath. 1 Inhaler 6  . amLODipine (NORVASC) 5 MG tablet TAKE 1 TABLET (5 MG TOTAL) BY MOUTH DAILY. 90 tablet 3  . amoxicillin-clavulanate (AUGMENTIN) 875-125 MG tablet Take 1 tablet by mouth 2 (two) times daily. 20 tablet 0  . atorvastatin (LIPITOR) 20 MG tablet Take 1 tablet (20 mg total) by mouth daily. 30 tablet 6  . budesonide-formoterol (SYMBICORT) 160-4.5 MCG/ACT inhaler Inhale 2 puffs into the lungs 2 (two) times daily. 1 Inhaler 11  . clopidogrel (PLAVIX) 75 MG tablet Take 1 tablet (75 mg total) by mouth daily. 90 tablet 3  . HYDROcodone-acetaminophen (NORCO/VICODIN) 5-325 MG tablet Take 1 tablet by mouth every 6 (six) hours as needed for moderate pain or severe pain. 15 tablet 0    . ibuprofen (ADVIL) 200 MG tablet Take 1 tablet (200 mg total) by mouth every 6 (six) hours as needed. 30 tablet 0  . isosorbide mononitrate (IMDUR) 60 MG 24 hr tablet Take 1 tablet (60 mg total) by mouth daily. 90 tablet 3  . KLOR-CON M20 20 MEQ tablet TAKE 2 TABLETS (40 MEQ TOTAL) BY MOUTH DAILY. 60 tablet 11  . losartan (COZAAR) 100 MG tablet Take 1 tablet (100 mg total) by mouth daily. 30 tablet 5  . metoprolol tartrate (LOPRESSOR) 25 MG tablet Take 1 tablet (25 mg total) by mouth 2 (two) times daily. 60 tablet 3  . nitroGLYCERIN (NITROSTAT) 0.4 MG SL tablet Place 1 tablet (0.4 mg total) under the tongue every 5 (five) minutes as needed for chest pain. 25 tablet 3   No current facility-administered medications for this visit.     Allergies:   Acyclovir and related and Aspirin    Social History:  The patient  reports that he quit smoking about 3 years ago. His smoking use included Cigarettes. He has a 28.00 pack-year smoking history. He has never used smokeless tobacco. He reports that he does not drink alcohol or use drugs.   Family History:  The patient's family history is not on file.    ROS:  Please see the history of present illness. All other systems are reviewed and negative.    PHYSICAL EXAM: VS:  BP (!) 164/76   Pulse 64   Ht 5\' 8"  (1.727 m)   Wt 151 lb (68.5 kg)   BMI 22.96 kg/m  , BMI Body mass index is 22.96 kg/m. GEN: Well nourished, well developed, male in no acute distress  HEENT: normal for age  Neck: JVD at 9 cm, no carotid bruit, no masses Cardiac: RRR; soft murmur, no rubs, or gallops Respiratory:  clear to auscultation bilaterally, normal work of breathing GI: soft, nontender, nondistended, + BS MS: no deformity or atrophy; no edema; distal pulses are 2+ in all 4 extremities   Skin: warm and dry, no rash Neuro:  Strength and sensation are intact Psych: euthymic mood, full affect   EKG:  EKG is not ordered today.  ECHO: 02/20/2016 - Left ventricle:  There was moderate concentric hypertrophy.   Systolic function was mildly reduced. The estimated ejection   fraction was in the range of 45% to 50%. Mild diffuse hypokinesis   with no identifiable regional variations. Abnormal relaxation   with increased filling pressures. - Ventricular septum: Septal motion showed paradox. These changes   are consistent with right ventricular pacing. - Mitral valve: There was mild regurgitation.  MV 02/2016  Nuclear stress EF: 39%. Diffuse hypokinesis with asynchronous contraction consistent with pacemaker  There was no ST segment  deviation noted during stress.  Defect 1: There is a small defect of moderate severity present in the mid inferolateral location. Question possible focal lateral infarction.  Findings consistent with prior myocardial infarction. No areas of ischemia identified.  This is an intermediate risk study.     Recent Labs: 11/29/2015: ALT 11 01/31/2016: Brain Natriuretic Peptide 119.9; Magnesium 1.8; TSH 2.67 02/03/2016: BUN 11; Creatinine, Ser 0.80; Potassium 4.0; Sodium 142 02/04/2016: Hemoglobin 10.7; Platelets 218    Lipid Panel    Component Value Date/Time   CHOL 202 (H) 11/29/2015 0908   TRIG 86 11/29/2015 0908   HDL 70 11/29/2015 0908   CHOLHDL 2.9 11/29/2015 0908   VLDL 17 11/29/2015 0908   LDLCALC 115 11/29/2015 0908   LDLDIRECT 132.9 06/18/2013 1038     Wt Readings from Last 3 Encounters:  04/09/16 151 lb (68.5 kg)  02/20/16 152 lb (68.9 kg)  02/03/16 152 lb (68.9 kg)     Other studies Reviewed: Additional studies/ records that were reviewed today include: Office notes, hospital records and testing.  ASSESSMENT AND PLAN:  1.  CAD: He is on good medical therapy with Plavix, Imdur, BB and prn SL NTG. No med changes at this time. Continue to treat sx prn.  2. Chronic systolic CHF: He has some JVD, and describes occasional orthopnea, but his weight is stable. We will check a BNP  3. Cramping: he has cramps  and is on potassium, but his last K+ was 4.0 in May 2017. We will check a BMET and a Mg today.   4. HTN: His BP is elevated today, but he states it is not normally that high. Continue to follow.   5. LVD: His EF is improved from previous. He has a MDT device, we will make sure it is arranged for him to do remote transmissions and f/u with Dr Johney Frame.   Current medicines are reviewed at length with the patient today.  The patient does not have concerns regarding medicines.  The following changes have been made:  no change  Labs/ tests ordered today include:   Orders Placed This Encounter  Procedures  . BASIC METABOLIC PANEL WITH GFR  . Magnesium  . Brain natriuretic peptide     Disposition:   FU with Dr Sw[place]iland  Signed, Theodore Demark, PA-C  04/09/2016 12:43 PM    [facility] Phone: [phone]; Fax: [phone]  This note was written with the assistance of speech recognition software. Please excuse any transcriptional errors.

## 2016-04-09 NOTE — Telephone Encounter (Signed)
Error

## 2016-04-10 ENCOUNTER — Telehealth: Payer: Self-pay | Admitting: Pulmonary Disease

## 2016-04-10 MED ORDER — BUDESONIDE-FORMOTEROL FUMARATE 160-4.5 MCG/ACT IN AERO: 2.0000 | INHALATION_SPRAY | Freq: Two times a day (BID) | RESPIRATORY_TRACT | 1 refills | Status: DC

## 2016-04-10 NOTE — Telephone Encounter (Signed)
Spoke with the pt I advised will refill symbicort, but he needs to scheduled and keep appt with BQ He verbalized understanding and scheduled ov for 05-15-16  I have refilled symbicort to last until then  Nothing further needed

## 2016-04-16 ENCOUNTER — Other Ambulatory Visit: Payer: Medicare Other

## 2016-04-17 LAB — BASIC METABOLIC PANEL WITH GFR
BUN: 9 mg/dL (ref 7–25)
CALCIUM: 9.3 mg/dL (ref 8.6–10.3)
CO2: 28 mmol/L (ref 20–31)
CREATININE: 0.83 mg/dL (ref 0.70–1.18)
Chloride: 106 mmol/L (ref 98–110)
GFR, Est Non African American: 88 mL/min (ref >=60)
GLUCOSE: 89 mg/dL (ref 65–99)
Potassium: 4.4 mmol/L (ref 3.5–5.3)
Sodium: 143 mmol/L (ref 135–146)

## 2016-04-17 LAB — MAGNESIUM: MAGNESIUM: 1.7 mg/dL (ref 1.5–2.5)

## 2016-04-17 LAB — BRAIN NATRIURETIC PEPTIDE: Brain Natriuretic Peptide: 95.4 pg/mL (ref <100)

## 2016-04-20 ENCOUNTER — Telehealth: Payer: Self-pay | Admitting: Internal Medicine

## 2016-04-20 NOTE — Telephone Encounter (Signed)
Called patient with lab results as written below. Patient verbalized understanding.  Notes Recorded by Ellsworth Lennox, PA on 04/17/2016 at 12:11 PM EDT Patient's BNP and Magnesium within normal limits. Electrolytes and kidney function stable. K+ WNL so this would not be account for his cramping. No significant abnormalities.

## 2016-04-20 NOTE — Telephone Encounter (Signed)
Pt returning this office call.  Pt would like a call back please with his lab results.

## 2016-05-01 ENCOUNTER — Encounter: Payer: Self-pay | Admitting: *Deleted

## 2016-05-15 ENCOUNTER — Encounter: Payer: Self-pay | Admitting: Pulmonary Disease

## 2016-05-15 ENCOUNTER — Encounter: Payer: Self-pay | Admitting: Physician Assistant

## 2016-05-15 ENCOUNTER — Ambulatory Visit (INDEPENDENT_AMBULATORY_CARE_PROVIDER_SITE_OTHER): Payer: Medicare Other | Admitting: Pulmonary Disease

## 2016-05-15 ENCOUNTER — Ambulatory Visit (INDEPENDENT_AMBULATORY_CARE_PROVIDER_SITE_OTHER)
Admission: RE | Admit: 2016-05-15 | Discharge: 2016-05-15 | Disposition: A | Discharge status: Home / Self Care | Payer: Medicare Other | Source: Ambulatory Visit | Attending: Pulmonary Disease | Admitting: Pulmonary Disease

## 2016-05-15 ENCOUNTER — Ambulatory Visit (INDEPENDENT_AMBULATORY_CARE_PROVIDER_SITE_OTHER): Payer: Medicare Other | Admitting: Physician Assistant

## 2016-05-15 VITALS — BP 124/76 | HR 64 | Ht 68.5 in | Wt 152.4 lb

## 2016-05-15 VITALS — BP 140/88 | HR 63 | Ht 68.5 in | Wt 151.6 lb

## 2016-05-15 DIAGNOSIS — R0602 Shortness of breath: Secondary | ICD-10-CM | POA: Diagnosis not present

## 2016-05-15 DIAGNOSIS — R0600 Dyspnea, unspecified: Secondary | ICD-10-CM

## 2016-05-15 DIAGNOSIS — I2510 Atherosclerotic heart disease of native coronary artery without angina pectoris: Secondary | ICD-10-CM

## 2016-05-15 DIAGNOSIS — I10 Essential (primary) hypertension: Secondary | ICD-10-CM

## 2016-05-15 DIAGNOSIS — R0609 Other forms of dyspnea: Secondary | ICD-10-CM

## 2016-05-15 MED ORDER — LOSARTAN POTASSIUM 100 MG PO TABS: 100.0000 mg | ORAL_TABLET | Freq: Every day | ORAL | 3 refills | Status: DC

## 2016-05-15 MED ORDER — ALBUTEROL SULFATE HFA 108 (90 BASE) MCG/ACT IN AERS: 1.0000 | INHALATION_SPRAY | Freq: Four times a day (QID) | RESPIRATORY_TRACT | 5 refills | Status: DC | PRN

## 2016-05-15 MED ORDER — CLOTRIMAZOLE 10 MG MT TROC: 10.0000 mg | Freq: Every day | OROMUCOSAL | 0 refills | Status: DC

## 2016-05-15 MED ORDER — BUDESONIDE-FORMOTEROL FUMARATE 160-4.5 MCG/ACT IN AERO: 2.0000 | INHALATION_SPRAY | Freq: Two times a day (BID) | RESPIRATORY_TRACT | 5 refills | Status: DC

## 2016-05-15 NOTE — Assessment & Plan Note (Signed)
Questionable progression of COPD Plan: Congratulations on quitting smoking. We will check a CXR today. We will call you with the results. We will send in a prescription for your Symbicort. Continue Symbicort 2 puffs twice daily. Remember to rinse your mouth after use. Mycelex lozenges 5 times a day x 7 days for thrush We will renew your prescription for your ventolin inhaler today also This can be used as needed for shortness of breath up to every 6 hours. We will order a home sleep study today.  We will order Pulmonary Function Tests.today. Follow up appointment with Dr. Kendrick Fries after sleep study and PFT's. Please contact office for sooner follow up if symptoms do not improve or worsen or seek emergency care

## 2016-05-15 NOTE — Progress Notes (Signed)
History of Present Illness [name] is a 75 y.o. male with COPD, CHF, and HTN  followed by Dr. Kendrick Fries.  Synopsis: COPD 07/08/13 PFT: FEV1 1.74L (66% pred, 30% change with BD), mod obst with resp to bronchodilator, moderate decrease diffusion, air-trapping 11/2013 Simple spiro>> clear obstruction, FEV1 1.30 L (47% pred) - trial of symbicort 160 2bid 01/26/15   05/15/2016 Follow up for COPD:  Pt. Presents today for follow up /  Symbicort  RX renewal.He says he continues to do well on his Symbicort.He states  his dyspnea is well controlled, but has noted he is having to use his rescue inhaler more at work.He states he is using his rescue inhaler twice daily. He states he has night mares and awakens gasping for air at night per his wife.[name] He is having a cardiopulmonary exercise test on 9/18 stress  arranged by his Dr. Chilton Si Office..We discussed the option of also having a home sleep study to evaluate him for sleep apnea. He states he has daytime sleepiness. He denies chest pain, fever. Orthopnea or hemoptysis. He does have a dry cough from time to time, but no productive cough. He uses a non-sedating anti histamine and nasal spray as needed.   Past medical hx Past Medical History:  Diagnosis Date  . CAD (coronary artery disease)    a. s/p stent RCA DES 9/09; b. 2014 Attempted PCI of OM1 @ [place];  c. 04/2014 Cath: LAD 40-50p, D1 95-99 (chronic), LCX 30-40 inf branch, OM1 CTO, RCA 30-40p, RCA patent stent, EF 35%->Med Rx.  . Chronic combined systolic and diastolic CHF (congestive heart failure) (HCC)    a. EF about 40 to 45% per echo in April 2013;  b. 04/2014 Echo: EF 20-25%, sev LVH, sev glob HK, Gr 1 DD, mildly reduced RV fxn, PASP .  [name] Chronic low back pain   . COPD (chronic obstructive pulmonary disease) (HCC) dx 06/2013   PFTs 07/08/13: mod obst with resp to bronchodilator, moderate decrease diffusion, airtrapping  . Dyslipidemia    a. on statin  . HLD (hyperlipidemia)     . HTN (hypertension)    a. Reports intolerance to hydralazine; b. no beta blockers 2/2 bradycardia;  c. failed on ACE and ARB.  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 LBBB (left bundle branch block)   . LVH (left ventricular hypertrophy)   . Mixed Ischemic/Non-ischemic Cardiomyopathy    a. 04/2014 Echo: EF 20-25%, sev glob HK.  [name] Noncompliance   . NSVT (nonsustained ventricular tachycardia) (HCC)   . Paroxysmal atrial fibrillation (HCC)    a. identified on device interrogation 01/2016     Past surgical hx, Family hx, Social hx all reviewed.  Current Outpatient Prescriptions on File Prior to Visit  Medication Sig  . albuterol (PROVENTIL HFA;VENTOLIN HFA) 108 (90 BASE) MCG/ACT inhaler Inhale 1 puff into the lungs every 6 (six) hours as needed for wheezing or shortness of breath.  [name] amLODipine (NORVASC) 5 MG tablet TAKE 1 TABLET (5 MG TOTAL) BY MOUTH DAILY.  [name] atorvastatin (LIPITOR) 20 MG tablet Take 1 tablet (20 mg total) by mouth daily.  . budesonide-formoterol (SYMBICORT) 160-4.5 MCG/ACT inhaler Inhale 2 puffs into the lungs 2 (two) times daily.  . clopidogrel (PLAVIX) 75 MG tablet Take 1 tablet (75 mg total) by mouth daily.  . isosorbide mononitrate (IMDUR) 60 MG 24 hr tablet Take 1 tablet (60 mg total) by mouth daily.  [name] KLOR-CON M20 20 MEQ tablet TAKE 2 TABLETS (40 MEQ  TOTAL) BY MOUTH DAILY.  [name]. losartan (COZAAR) 100 MG tablet Take 1 tablet (100 mg total) by mouth daily.  . metoprolol tartrate (LOPRESSOR) 25 MG tablet Take 1 tablet (25 mg total) by mouth 2 (two) times daily.  . nitroGLYCERIN (NITROSTAT) 0.4 MG SL tablet Place 1 tablet (0.4 mg total) under the tongue every 5 (five) minutes as needed for chest pain.   No current facility-administered medications on file prior to visit.      Allergies  Allergen Reactions  . Acyclovir And Related     unknown  . Aspirin Other (See Comments)    GI upset at high doses only.    Review Of Systems:  Constitutional:   No  weight  loss, night sweats,  Fevers, chills, fatigue, or  lassitude.  HEENT:   No headaches,  Difficulty swallowing,  Tooth/dental problems, or  Sore throat,                No sneezing, itching, ear ache, +nasal congestion,+ post nasal drip,   CV:  No chest pain,  Orthopnea, PND, swelling in lower extremities, anasarca, dizziness, palpitations, syncope.   GI  No heartburn, indigestion, abdominal pain, nausea, vomiting, diarrhea, change in bowel habits, loss of appetite, bloody stools.   Resp: + shortness of breath with exertion less at rest.  No excess mucus, no productive cough,  + non-productive cough,( rare)  No coughing up of blood.  No change in color of mucus.  + wheezing.  No chest wall deformity  Skin: no rash or lesions.  GU: no dysuria, change in color of urine, no urgency or frequency.  No flank pain, no hematuria   MS:  No joint pain or swelling.  No decreased range of motion.  No back pain.  Psych:  No change in mood or affect. No depression or anxiety.  No memory loss.   Vital Signs BP 124/76 (BP Location: Right Arm, Patient Position: Sitting, Cuff Size: Normal)   Pulse 64   Ht 5' 8.5" (1.74 m)   Wt 152 lb 6.4 oz (69.1 kg)   SpO2 99%   BMI 22.84 kg/m    Physical Exam:  General- No distress,  A&Ox3, thin male ENT: No sinus tenderness, TM clear, pale nasal mucosa, no oral exudate,no post nasal drip, no LAN, + oral thrush Cardiac: S1, S2, regular rate and rhythm, no murmur Chest: + wheeze/ no rales/ dullness; no accessory muscle use, no nasal flaring, no sternal retractions Abd.: Soft Non-tender Ext: No clubbing cyanosis, edema Neuro:  normal strength Skin: No rashes, warm and dry Psych: normal mood and behavior   Assessment/Plan  COPD with asthma (HCC) Questionable progression of COPD Plan: Congratulations on quitting smoking. We will check a CXR today. We will call you with the results. We will send in a prescription for your Symbicort. Continue Symbicort 2  puffs twice daily. Remember to rinse your mouth after use. Mycelex lozenges 5 times a day x 7 days for thrush We will renew your prescription for your ventolin inhaler today also This can be used as needed for shortness of breath up to every 6 hours. We will order a home sleep study today.  We will order Pulmonary Function Tests.today. Follow up appointment with Dr. Kendrick FriesMcQuaid after sleep study and PFT's. Please contact office for sooner follow up if symptoms do not improve or worsen or seek emergency care       Bevelyn NgoSarah F Lexxie Winberg, NP 05/15/2016  2:47 PM

## 2016-05-15 NOTE — Patient Instructions (Signed)
Medications:  Increase Isosorbide to 90 mg daily.   Procedures/Testing:  Your physician has recommended that you have a cardiopulmonary stress test (CPX). CPX testing is a non-invasive measurement of heart and lung function. It replaces a traditional treadmill stress test. This type of test provides a tremendous amount of information that relates not only to your present condition but also for future outcomes. This test combines measurements of you ventilation, respiratory gas exchange in the lungs, electrocardiogram (EKG), blood pressure and physical response before, during, and following an exercise protocol.   Follow-Up:  Your physician recommends that you schedule a follow-up appointment: 1st available with Dr. Swaziland post testing (preferred); or with Theodore Demark on day Dr. Swaziland is in office if needed.  If you need a refill on your cardiac medications before your next appointment, please call your pharmacy.

## 2016-05-15 NOTE — Progress Notes (Signed)
Cardiology Office Note   Date:  05/15/2016   ID:  [name], DOB 1940-09-30, MRN [id number]  PCP:  Rene Paci, MD  Cardiologist:  Dr [place]  Vincent Kitzmiller, PA-C   Chief Complaint  Patient presents with  . Shortness of Breath    f/u    History of Present Illness: [name] is a 75 y.o. 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 Wt & BNP stable, BMET & Mg ok.  [name] presents for Follow-up and further evaluation of the shortness of breath.  He still has significant problems with dyspnea on exertion. He works as a Electrical engineer and will frequently get short of breath when on his rounds. He has to stop and catch his breath, he also uses an inhaler. He says that he does wheeze at times. He feels that the Symbicort which she uses twice a day and the Ventolin which is as needed help him great deal.  He has not had lower extremity edema, and denies orthopnea. However, he describes PND and states that he has been waking up short of breath and sweating more frequently, 3 times last week. The symptoms seem worse if he is lying on his right side.  He has not had any chest pain, and feels that he can exert himself as usual without any difficulty. He has not had any syncope.   Past Medical History:  Diagnosis Date  . CAD (coronary artery disease)    a. s/p stent RCA DES 9/09; b. 2014 Attempted PCI of OM1 @ [place];  c. 04/2014 Cath: LAD 40-50p, D1 95-99 (chronic), LCX 30-40 inf branch, OM1 CTO, RCA 30-40p, RCA patent stent, EF 35%->Med Rx.  . Chronic combined systolic and diastolic CHF (congestive heart failure) (HCC)    a. EF about 40 to 45% per echo in April 2013;  b. 04/2014 Echo: EF 20-25%, sev LVH, sev glob HK, Gr 1 DD, mildly reduced RV fxn, PASP .  [name] Chronic low back pain    . COPD (chronic obstructive pulmonary disease) (HCC) dx 06/2013   PFTs 07/08/13: mod obst with resp to bronchodilator, moderate decrease diffusion, airtrapping  . Dyslipidemia    a. on statin  . HLD (hyperlipidemia)   . HTN (hypertension)    a. Reports intolerance to hydralazine; b. no beta blockers 2/2 bradycardia;  c. failed on ACE and ARB.  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 LBBB (left bundle branch block)   . LVH (left ventricular hypertrophy)   . Mixed Ischemic/Non-ischemic Cardiomyopathy    a. 04/2014 Echo: EF 20-25%, sev glob HK.  [name] Noncompliance   . NSVT (nonsustained ventricular tachycardia) (HCC)   . Paroxysmal atrial fibrillation (HCC)    a. identified on device interrogation 01/2016    Past Surgical History:  Procedure Laterality Date  . BI-VENTRICULAR IMPLANTABLE CARDIOVERTER DEFIBRILLATOR N/A 08/12/2014   MDT [name] CRTD implanted by Dr Johney Frame  . CARDIAC CATHETERIZATION     ejection fraction 50%  . LEFT HEART CATHETERIZATION WITH CORONARY ANGIOGRAM N/A 05/08/2014   Procedure: LEFT HEART CATHETERIZATION WITH CORONARY ANGIOGRAM;  Surgeon: Micheline Chapman, MD;  Location: [facility] CATH LAB;  Service: Cardiovascular;  Laterality: N/A;    Current Outpatient  Prescriptions  Medication Sig Dispense Refill  . albuterol (PROVENTIL HFA;VENTOLIN HFA) 108 (90 BASE) MCG/ACT inhaler Inhale 1 puff into the lungs every 6 (six) hours as needed for wheezing or shortness of breath. 1 Inhaler 6  . amLODipine (NORVASC) 5 MG tablet TAKE 1 TABLET (5 MG TOTAL) BY MOUTH DAILY. 90 tablet 3  . atorvastatin (LIPITOR) 20 MG tablet Take 1 tablet (20 mg total) by mouth daily. 30 tablet 6  . budesonide-formoterol (SYMBICORT) 160-4.5 MCG/ACT inhaler Inhale 2 puffs into the lungs 2 (two) times daily. 1 Inhaler 1  . clopidogrel (PLAVIX) 75 MG tablet Take 1 tablet (75 mg total) by mouth daily. 90 tablet 3  . isosorbide mononitrate (IMDUR) 60 MG 24 hr tablet Take 1 tablet (60 mg total) by mouth  daily. 90 tablet 3  . KLOR-CON M20 20 MEQ tablet TAKE 2 TABLETS (40 MEQ TOTAL) BY MOUTH DAILY. 60 tablet 11  . losartan (COZAAR) 100 MG tablet Take 1 tablet (100 mg total) by mouth daily. 30 tablet 5  . metoprolol tartrate (LOPRESSOR) 25 MG tablet Take 1 tablet (25 mg total) by mouth 2 (two) times daily. 60 tablet 3  . nitroGLYCERIN (NITROSTAT) 0.4 MG SL tablet Place 1 tablet (0.4 mg total) under the tongue every 5 (five) minutes as needed for chest pain. 25 tablet 3   No current facility-administered medications for this visit.     Allergies:   Acyclovir and related and Aspirin    Social History:  The patient  reports that he quit smoking about 3 years ago. His smoking use included Cigarettes. He has a 28.00 pack-year smoking history. He has never used smokeless tobacco. He reports that he does not drink alcohol or use drugs.   Family History:  The patient's family history is not on file.    ROS:  Please see the history of present illness. All other systems are reviewed and negative.    PHYSICAL EXAM: VS:  BP 140/88   Pulse 63   Ht 5' 8.5" (1.74 m)   Wt 151 lb 9.6 oz (68.8 kg)   BMI 22.72 kg/m  , BMI Body mass index is 22.72 kg/m. GEN: Well nourished, well developed, male in no acute distress  HEENT: normal for age  Neck: JVD 8 cm, no carotid bruit, no masses Cardiac: RRR; soft murmur, no rubs, or gallops Respiratory:  clear to auscultation bilaterally, normal work of breathing GI: soft, nontender, nondistended, + BS MS: no deformity or atrophy; no edema; distal pulses are 2+ in all 4 extremities   Skin: warm and dry, no rash Neuro:  Strength and sensation are intact Psych: euthymic mood, full affect   EKG:  EKG is ordered today. The ekg ordered today demonstrates atrial and ventricular pacing, QRS duration 160 ms   Recent Labs: 11/29/2015: ALT 11 01/31/2016: TSH 2.67 02/04/2016: Hemoglobin 10.7; Platelets 218 04/17/2016: Brain Natriuretic Peptide 95.4; BUN 9; Creat 0.83;  Magnesium 1.7; Potassium 4.4; Sodium 143    Lipid Panel    Component Value Date/Time   CHOL 202 (H) 11/29/2015 0908   TRIG 86 11/29/2015 0908   HDL 70 11/29/2015 0908   CHOLHDL 2.9 11/29/2015 0908   VLDL 17 11/29/2015 0908   LDLCALC 115 11/29/2015 0908   LDLDIRECT 132.9 06/18/2013 1038     Wt Readings from Last 3 Encounters:  05/15/16 152 lb 6.4 oz (69.1 kg)  05/15/16 151 lb 9.6 oz (68.8 kg)  04/09/16 151 lb (68.5 kg)     Other studies Reviewed:  Additional studies/ records that were reviewed today include: Office notes, hospital records and testing.  ASSESSMENT AND PLAN:  1.  Dyspnea on exertion: He does not have any overt symptoms of heart failure at this time. His volume status is stable. He is not wheezing. It is difficult to sort out whether the primary cause of his symptoms is respiratory or cardiac. The PND could be from hypoxia, or from heart failure.  We will get a CPX test. I will also send a message to Amber asking if it would be helpful to optimize his Biv ICD.   2. Hypertension: His blood pressures on the high side of normal, but it is lower than the 2 previous visits. Continue to follow this.  3. CAD: He is on good medical therapy with Lipitor, Plavix, Imdur, Cozaar, Lopressor.  **The patient wishes to have his labs done in the [facility] lab in the future, not the lab in the [facility]  Current medicines are reviewed at length with the patient today.  The patient does not have concerns regarding medicines.  The following changes have been made:  no change  Labs/ tests ordered today include:   Orders Placed This Encounter  Procedures  . Cardiopulmonary exercise test  . EKG 12-Lead     Disposition:   FU with Dr. [place]  Signed, Theodore Demark, PA-C  05/15/2016 2:42 PM    [facility] Phone: [phone]; Fax: [phone]  This note was written with the assistance of speech recognition software. Please excuse  any transcriptional errors.

## 2016-05-15 NOTE — Progress Notes (Signed)
Attending:  I have seen and examined the patient with severe gross and I agree with the findings from her note  More shortness of breath recently, has significant dyspnea when walking up to 45 minutes at a time at work. He does exercise quite a bit with work. Using rescue inhaler several times per day smoked cigarettes as recently as 4-6 months ago during a stressful period.  On exam: Wheezing/rhonchi left greater than right Regular rate and rhythm no murmurs gallops rubs  Tobacco abuse: Counseled to stay off cigarettes altogether Worsening dyspnea: Likely due to COPD, I'm concerned about him. Will order pulmonary mention testing and chest x-ray continue Symbicort COPD: Continue Symbicort Nighttime terrors, insomnia, gasping for air at night: Polysomnogram  Heber [place], MD [facility] Pager: [phone] Cell: [phone] After 3pm or if no response, call [phone]

## 2016-05-15 NOTE — Patient Instructions (Addendum)
It is nice to meet you today.  Congratulations on quitting smoking. We will check a CXR today. We will call you with the results. We will send in a prescription for your Symbicort. Continue Symbicort 2 puffs twice daily. Remember to rinse your mouth after use. Mycelex lozenges 5 times a day x 7 days for thrush We will renew your prescription for your ventolin inhaler today also This can be used as needed for shortness of breath up to every 6 hours. We will order a home sleep study today.  We will order Pulmonary Function Tests.today. Follow up appointment with Dr. Kendrick Fries after sleep study and PFT's. Please contact office for sooner follow up if symptoms do not improve or worsen or seek emergency care

## 2016-05-17 ENCOUNTER — Telehealth: Payer: Self-pay | Admitting: Pulmonary Disease

## 2016-05-18 NOTE — Telephone Encounter (Signed)
Patient notified.  No questions or concerns at this time. Nothing further needed.   

## 2016-05-18 NOTE — Telephone Encounter (Signed)
Notes Recorded by Velvet Bathe, CMA on 05/16/2016 at 5:34 PM EDT lmtcb X1 for pt to relay results/recs. ------ Notes Recorded by Lupita Leash, MD on 05/15/2016 at 4:18 PM EDT A, Please let the patient know this was OK Thanks, B ------------------------------------------------------------------------------------------------------------- Pt is aware of his results by voicemail per his request. Nothing further was needed.

## 2016-05-21 ENCOUNTER — Telehealth (HOSPITAL_COMMUNITY): Payer: Self-pay | Admitting: Vascular Surgery

## 2016-05-21 NOTE — Telephone Encounter (Signed)
Left pt message to reschedule CPX, NO MD

## 2016-05-28 ENCOUNTER — Encounter (HOSPITAL_COMMUNITY): Payer: Self-pay

## 2016-05-29 ENCOUNTER — Encounter (HOSPITAL_COMMUNITY): Payer: Self-pay

## 2016-06-11 ENCOUNTER — Encounter (HOSPITAL_COMMUNITY): Payer: Self-pay

## 2016-06-11 ENCOUNTER — Ambulatory Visit (HOSPITAL_COMMUNITY): Payer: Medicare Other

## 2016-06-11 DIAGNOSIS — G4733 Obstructive sleep apnea (adult) (pediatric): Secondary | ICD-10-CM | POA: Diagnosis not present

## 2016-06-14 DIAGNOSIS — G4733 Obstructive sleep apnea (adult) (pediatric): Secondary | ICD-10-CM | POA: Diagnosis not present

## 2016-06-19 ENCOUNTER — Other Ambulatory Visit: Payer: Self-pay | Admitting: *Deleted

## 2016-06-19 DIAGNOSIS — R0600 Dyspnea, unspecified: Secondary | ICD-10-CM

## 2016-06-22 ENCOUNTER — Telehealth: Payer: Self-pay

## 2016-06-22 NOTE — Telephone Encounter (Signed)
-----   Message from Lupita Leash, MD sent at 06/18/2016 10:19 AM EDT ----- Thanks!  Serafina Royals recommends in lab polysomnogram.  Can you please arrange?  Thanks B ----- Message ----- From: Louann Sjogren, MD Sent: 06/13/2016   2:12 PM To: Lupita Leash, MD  Hello Kipp Brood.   Pt had a HST which showed the AHI was 3.  Sleep study hours was adequate.  Pt has CHF systolic.  If you are really concerned about OSA, he can probably have a lab study if insurance will cover. Otherwise, try to figure out other causes for hypersomnia -- meds, depression, insufficient sleep, etc.  Eddie Candle

## 2016-06-22 NOTE — Telephone Encounter (Signed)
lmtcb X1 for pt.  Will order npsg after speaking to patient.

## 2016-06-25 ENCOUNTER — Ambulatory Visit (HOSPITAL_COMMUNITY): Payer: Medicare Other | Attending: Physician Assistant

## 2016-07-03 ENCOUNTER — Ambulatory Visit (HOSPITAL_COMMUNITY): Payer: Medicare Other | Attending: Pulmonary Disease

## 2016-07-03 ENCOUNTER — Ambulatory Visit (INDEPENDENT_AMBULATORY_CARE_PROVIDER_SITE_OTHER): Payer: Medicare Other | Admitting: Pulmonary Disease

## 2016-07-03 ENCOUNTER — Encounter: Payer: Self-pay | Admitting: Pulmonary Disease

## 2016-07-03 DIAGNOSIS — Z720 Tobacco use: Secondary | ICD-10-CM

## 2016-07-03 DIAGNOSIS — I10 Essential (primary) hypertension: Secondary | ICD-10-CM

## 2016-07-03 DIAGNOSIS — G4734 Idiopathic sleep related nonobstructive alveolar hypoventilation: Secondary | ICD-10-CM

## 2016-07-03 DIAGNOSIS — J449 Chronic obstructive pulmonary disease, unspecified: Secondary | ICD-10-CM

## 2016-07-03 DIAGNOSIS — I25118 Atherosclerotic heart disease of native coronary artery with other forms of angina pectoris: Secondary | ICD-10-CM

## 2016-07-03 MED ORDER — AMLODIPINE BESYLATE 5 MG PO TABS: 10.0000 mg | ORAL_TABLET | Freq: Every day | ORAL | 3 refills | Status: DC

## 2016-07-03 NOTE — Assessment & Plan Note (Signed)
His sleep study did not show sleep apnea but he did have significant nocturnal hypoxemia which is common with patients with COPD. The use of nocturnal oxygen has been shown to her Pap slow the progression of pulmonary hypertension this is debatable. I suspected we start him on home oxygen while sleeping he will feel better during the daytime however.  Plan: Start 2 L of oxygen daily at bedtime

## 2016-07-03 NOTE — Addendum Note (Signed)
Addended by: Velvet Bathe on: 07/03/2016 05:01 PM   Modules accepted: Orders

## 2016-07-03 NOTE — Progress Notes (Signed)
Subjective:    Patient ID: [name], male    DOB: 06-19-1941, 75 y.o.   MRN: [id number]  Synopsis: COPD 07/08/13 PFT: FEV1 1.74L (66% pred, 30% change with BD), mod obst with resp to bronchodilator, moderate decrease diffusion, air-trapping 11/2013 Simple spiro>> clear obstruction, FEV1 1.30 L (47% pred) - trial of symbicort 160 2bid 01/26/15   HPI Chief Complaint  Patient presents with  . Follow-up    review pft and sleep study.     [name] says that the Symbicort is really helping with his dyspnea.  He says that he started taking it again and it helped.  He went for his stress test today and he had to stop due to hypertension.  He has been checking his BP at home and it has been between 139-147 systolic.  Whenever he exerts himself he will feel like he is about to pass out.  He will feel light headed and visual changes that last for about 5-6 seconds.  He says drinking water helps.    He is still walking long distances at work, he walks for 25 minutes at a time.  He will feel some claudication in his legs when he walks.    He has not had the lung function test yet.  He refuses the flu shot.    Past Medical History:  Diagnosis Date  . CAD (coronary artery disease)    a. s/p stent RCA DES 9/09; b. 2014 Attempted PCI of OM1 @ [place];  c. 04/2014 Cath: LAD 40-50p, D1 95-99 (chronic), LCX 30-40 inf branch, OM1 CTO, RCA 30-40p, RCA patent stent, EF 35%->Med Rx.  . Chronic combined systolic and diastolic CHF (congestive heart failure) (HCC)    a. EF about 40 to 45% per echo in April 2013;  b. 04/2014 Echo: EF 20-25%, sev LVH, sev glob HK, Gr 1 DD, mildly reduced RV fxn, PASP 38mmHg.  [name]. Chronic low back pain   . COPD (chronic obstructive pulmonary disease) (HCC) dx 06/2013   PFTs 07/08/13: mod obst with resp to bronchodilator, moderate decrease diffusion, airtrapping  . Dyslipidemia    a. on statin  . HLD (hyperlipidemia)   . HTN (hypertension)    a. Reports intolerance to  hydralazine; b. no beta blockers 2/2 bradycardia;  c. failed on ACE and ARB.  [name]. 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 LBBB (left bundle branch block)   . LVH (left ventricular hypertrophy)   . Mixed Ischemic/Non-ischemic Cardiomyopathy    a. 04/2014 Echo: EF 20-25%, sev glob HK.  [name]. Noncompliance   . NSVT (nonsustained ventricular tachycardia) (HCC)   . Paroxysmal atrial fibrillation (HCC)    a. identified on device interrogation 01/2016      Review of Systems  Constitutional: Positive for fatigue. Negative for chills and fever.  HENT: Negative for postnasal drip, rhinorrhea and sinus pressure.   Respiratory: Positive for shortness of breath. Negative for cough and wheezing.   Cardiovascular: Negative for chest pain, palpitations and leg swelling.       Objective:   Physical Exam Vitals:   07/03/16 1624  BP: 132/70  Pulse: 69  SpO2: 99%  Weight: 152 lb 12.8 oz (69.3 kg)  Height: 5' 8.5" (1.74 m)  RA  Gen: well appearing HENT: OP clear, TM's clear, neck supple PULM: Crackles bilaterally in bases, normal air movment CV: RRR, no mgr, no JVD, no leg edema GI: BS+, soft, nontender Derm: no cyanosis or rash Psyche: normal mood and affect  October 2017 home sleep study showed an AHI of only 2.9, O2 saturation nadir 79% September 2017 chest x-ray showed hyperinflation     Assessment & Plan:  Nocturnal hypoxemia His sleep study did not show sleep apnea but he did have significant nocturnal hypoxemia which is common with patients with COPD. The use of nocturnal oxygen has been shown to her Pap slow the progression of pulmonary hypertension this is debatable. I suspected we start him on home oxygen while sleeping he will feel better during the daytime however.  Plan: Start 2 L of oxygen daily at bedtime  Essential hypertension He has a significant cardiac history but he's been noting multiple elevated blood pressure readings at home. He denies leg swelling. Today at his  stress test is blood pressure was as high as 180 systolic.  Plan: Increase amlodipine to 10 mg daily, advised to watch out for syncope, leg swelling Continue other medicines, I would prefer not to elevate the metoprolol if possible.  Tobacco abuse He has been abstaining from cigarettes which is helped his breathing significantly. He was congratulated on this today. We'll refer to the low-dose lung cancer screening program.  COPD with asthma (HCC) Significant improvement with stopping smoking and starting Symbicort routinely.  Plan: Simple spirometry today Continue Symbicort twice a day He refuses flu shot    Current Outpatient Prescriptions:  .  albuterol (PROVENTIL HFA;VENTOLIN HFA) 108 (90 Base) MCG/ACT inhaler, Inhale 1 puff into the lungs every 6 (six) hours as needed for wheezing or shortness of breath., Disp: 1 Inhaler, Rfl: 5 .  amLODipine (NORVASC) 5 MG tablet, TAKE 1 TABLET (5 MG TOTAL) BY MOUTH DAILY., Disp: 90 tablet, Rfl: 3 .  atorvastatin (LIPITOR) 20 MG tablet, Take 1 tablet (20 mg total) by mouth daily., Disp: 30 tablet, Rfl: 6 .  budesonide-formoterol (SYMBICORT) 160-4.5 MCG/ACT inhaler, Inhale 2 puffs into the lungs 2 (two) times daily., Disp: 1 Inhaler, Rfl: 5 .  clopidogrel (PLAVIX) 75 MG tablet, Take 1 tablet (75 mg total) by mouth daily., Disp: 90 tablet, Rfl: 3 .  isosorbide mononitrate (IMDUR) 60 MG 24 hr tablet, Take 1 tablet (60 mg total) by mouth daily., Disp: 90 tablet, Rfl: 3 .  KLOR-CON M20 20 MEQ tablet, TAKE 2 TABLETS (40 MEQ TOTAL) BY MOUTH DAILY., Disp: 60 tablet, Rfl: 11 .  losartan (COZAAR) 100 MG tablet, Take 1 tablet (100 mg total) by mouth daily., Disp: 90 tablet, Rfl: 3 .  metoprolol tartrate (LOPRESSOR) 25 MG tablet, Take 1 tablet (25 mg total) by mouth 2 (two) times daily., Disp: 60 tablet, Rfl: 3 .  nitroGLYCERIN (NITROSTAT) 0.4 MG SL tablet, Place 1 tablet (0.4 mg total) under the tongue every 5 (five) minutes as needed for chest pain., Disp: 25  tablet, Rfl: 3

## 2016-07-03 NOTE — Addendum Note (Signed)
Addended by: Velvet Bathe on: 07/03/2016 05:07 PM   Modules accepted: Orders

## 2016-07-03 NOTE — Assessment & Plan Note (Signed)
Significant improvement with stopping smoking and starting Symbicort routinely.  Plan: Simple spirometry today Continue Symbicort twice a day He refuses flu shot

## 2016-07-03 NOTE — Patient Instructions (Signed)
Take amlodipine 10 mg daily Keep taking Symbicort twice a day no matter how you feel Stay away from cigarettes Follow-up with Dr. Johney Frame regarding your cardiac disease I will see you back in 4 months or sooner if needed

## 2016-07-03 NOTE — Assessment & Plan Note (Signed)
He has been abstaining from cigarettes which is helped his breathing significantly. He was congratulated on this today. We'll refer to the low-dose lung cancer screening program.

## 2016-07-03 NOTE — Assessment & Plan Note (Signed)
He has a significant cardiac history but he's been noting multiple elevated blood pressure readings at home. He denies leg swelling. Today at his stress test is blood pressure was as high as 180 systolic.  Plan: Increase amlodipine to 10 mg daily, advised to watch out for syncope, leg swelling Continue other medicines, I would prefer not to elevate the metoprolol if possible.

## 2016-07-05 ENCOUNTER — Telehealth: Payer: Self-pay | Admitting: Pulmonary Disease

## 2016-07-05 ENCOUNTER — Other Ambulatory Visit (HOSPITAL_COMMUNITY): Payer: Self-pay | Admitting: *Deleted

## 2016-07-05 ENCOUNTER — Other Ambulatory Visit: Payer: Self-pay

## 2016-07-05 DIAGNOSIS — R0600 Dyspnea, unspecified: Secondary | ICD-10-CM | POA: Diagnosis not present

## 2016-07-05 DIAGNOSIS — G4734 Idiopathic sleep related nonobstructive alveolar hypoventilation: Secondary | ICD-10-CM

## 2016-07-05 DIAGNOSIS — R0609 Other forms of dyspnea: Principal | ICD-10-CM

## 2016-07-05 DIAGNOSIS — J449 Chronic obstructive pulmonary disease, unspecified: Secondary | ICD-10-CM

## 2016-07-05 NOTE — Progress Notes (Unsigned)
New ordered needed since last CPX was cancelled.

## 2016-07-06 NOTE — Telephone Encounter (Signed)
BQ mandy from [facility] called back and stated that the pt will have to have cpap titration or be qualified in the office.  Please advise. thanks

## 2016-07-06 NOTE — Telephone Encounter (Signed)
Spoke with Angelica Chessman at [facility] [facility]. She states that they can't provide pt oxygen based off the of sleep study. Pt will need to have a CPAP titration study in order to have insurance pay for oxygen.  BQ - please advise. Thanks.

## 2016-07-06 NOTE — Telephone Encounter (Signed)
lmtcb x1 for [name] with [facility]. Need to know if an ONO will suffice.

## 2016-07-06 NOTE — Telephone Encounter (Signed)
Mandy [phone]) with [facility] called back.Marland KitchenMarland KitchenAngelica Chessman states a ONO will not suffice; either have to be a CPAP titration or qualify him in the office.Charm Rings

## 2016-07-06 NOTE — Telephone Encounter (Signed)
That doesn't seem right.  Can we ask around about that?  I would think the sleep study should more than suffice.  Maybe another company [employer]) will provide.  If not then he would need an ONO on RA.

## 2016-07-09 ENCOUNTER — Telehealth: Payer: Self-pay | Admitting: Cardiology

## 2016-07-09 ENCOUNTER — Other Ambulatory Visit: Payer: Self-pay | Admitting: Pulmonary Disease

## 2016-07-09 ENCOUNTER — Other Ambulatory Visit: Payer: Self-pay

## 2016-07-09 DIAGNOSIS — R0600 Dyspnea, unspecified: Secondary | ICD-10-CM

## 2016-07-09 DIAGNOSIS — G4734 Idiopathic sleep related nonobstructive alveolar hypoventilation: Secondary | ICD-10-CM

## 2016-07-09 MED ORDER — METOPROLOL TARTRATE 50 MG PO TABS: 50.0000 mg | ORAL_TABLET | Freq: Two times a day (BID) | ORAL | 6 refills | Status: DC

## 2016-07-09 NOTE — Telephone Encounter (Signed)
Order placed for the ONO on room air. Nothing further is needed.

## 2016-07-09 NOTE — Telephone Encounter (Signed)
No answer. Left message to call back.   

## 2016-07-09 NOTE — Telephone Encounter (Signed)
[name] is calling to get his results PFT test . Please call

## 2016-07-09 NOTE — Telephone Encounter (Signed)
He doesn't have sleep apnea so we can't do a CPAP titration. Can we just order an ONO on room air?  Use someone other than [facility]

## 2016-07-09 NOTE — Telephone Encounter (Signed)
This was disucced in last office visit.  Will close encounter.

## 2016-07-10 NOTE — Telephone Encounter (Signed)
Notes Recorded by Charna Elizabeth, LPN on 38/25/0539 at 6:10 PM EDT Patient called results given.Advised to increase Metoprolol to 50 mg twice a day ------

## 2016-07-12 ENCOUNTER — Encounter: Payer: Self-pay | Admitting: Pulmonary Disease

## 2016-07-17 ENCOUNTER — Ambulatory Visit (INDEPENDENT_AMBULATORY_CARE_PROVIDER_SITE_OTHER): Payer: Medicare Other | Admitting: Pulmonary Disease

## 2016-07-17 DIAGNOSIS — R0600 Dyspnea, unspecified: Secondary | ICD-10-CM

## 2016-07-17 DIAGNOSIS — J449 Chronic obstructive pulmonary disease, unspecified: Secondary | ICD-10-CM

## 2016-07-17 LAB — PULMONARY FUNCTION TEST
DL/VA % pred: 93 %
DL/VA: 4.16 ml/min/mmHg/L
DLCO UNC % PRED: 58 %
DLCO UNC: 16.93 ml/min/mmHg
DLCO cor % pred: 61 %
DLCO cor: 17.79 ml/min/mmHg
FEF 25-75 PRE: 0.71 L/s
FEF 25-75 Post: 1.1 L/sec
FEF2575-%Change-Post: 54 %
FEF2575-%PRED-POST: 51 %
FEF2575-%Pred-Pre: 33 %
FEV1-%Change-Post: 15 %
FEV1-%PRED-POST: 64 %
FEV1-%PRED-PRE: 55 %
FEV1-POST: 1.63 L
FEV1-PRE: 1.42 L
FEV1FVC-%Change-Post: 4 %
FEV1FVC-%PRED-PRE: 81 %
FEV6-%CHANGE-POST: 10 %
FEV6-%PRED-POST: 76 %
FEV6-%Pred-Pre: 69 %
FEV6-PRE: 2.25 L
FEV6-Post: 2.48 L
FEV6FVC-%CHANGE-POST: 0 %
FEV6FVC-%PRED-PRE: 103 %
FEV6FVC-%Pred-Post: 102 %
FVC-%Change-Post: 10 %
FVC-%Pred-Post: 74 %
FVC-%Pred-Pre: 67 %
FVC-Post: 2.54 L
FVC-Pre: 2.29 L
POST FEV1/FVC RATIO: 64 %
Post FEV6/FVC ratio: 98 %
Pre FEV1/FVC ratio: 62 %
Pre FEV6/FVC Ratio: 98 %
RV % PRED: 112 %
RV: 2.66 L
TLC % pred: 78 %
TLC: 5.1 L

## 2016-07-19 ENCOUNTER — Telehealth: Payer: Self-pay

## 2016-07-19 DIAGNOSIS — G4734 Idiopathic sleep related nonobstructive alveolar hypoventilation: Secondary | ICD-10-CM

## 2016-07-19 DIAGNOSIS — J449 Chronic obstructive pulmonary disease, unspecified: Secondary | ICD-10-CM

## 2016-07-19 NOTE — Telephone Encounter (Signed)
lmtcb X1 for pt to relay results/recs.  Will order 02 after speaking to pt.

## 2016-07-19 NOTE — Telephone Encounter (Signed)
-----   Message from Lupita Leash, MD sent at 07/18/2016  3:14 PM EST ----- A, His ONO showed that his O2 dropped below 88% for about 30 minutes. He should be on 2L O2 qHS. Please let him know and order. Thanks B

## 2016-07-23 NOTE — Telephone Encounter (Signed)
Pt returned phone call, contact # [phone].Charm Rings

## 2016-07-23 NOTE — Telephone Encounter (Signed)
Called and spoke with pt and he is aware of results per BQ.  Nothing further is needed and order has been placed for the oxygen to be set up.

## 2016-08-01 ENCOUNTER — Telehealth: Payer: Self-pay | Admitting: Pulmonary Disease

## 2016-08-01 NOTE — Telephone Encounter (Signed)
CY  In Dr. Kendrick Fries absence please advise any recommendations for pt.'s nasal congestion when using O2 at night. Pt. Uses 2L nightly and c/o congestions and dryness every morning. Uses Symbicort BID as directed by BQ. Pt. States that if congestion does not become under control he states he will quit the oxygen. Can we send an order to DME for a humidifier bottle for his concentrator to see if this will help.   Please Advise

## 2016-08-01 NOTE — Telephone Encounter (Signed)
Yes- please order humidifier for home O2 concentrator  Can also use otc Nasal saline gel as needd

## 2016-08-01 NOTE — Telephone Encounter (Signed)
Called and spoke to pt. Informed him of the recs per BQ. Pt states he does not agree with this and would like to come in for a visit. Appt scheduled with BQ on 08/06/16. Pt verbalized understanding and denied any further questions or concerns at this time.

## 2016-08-06 ENCOUNTER — Encounter: Payer: Self-pay | Admitting: Pulmonary Disease

## 2016-08-06 ENCOUNTER — Ambulatory Visit (INDEPENDENT_AMBULATORY_CARE_PROVIDER_SITE_OTHER): Payer: Medicare Other | Admitting: Pulmonary Disease

## 2016-08-06 DIAGNOSIS — I25118 Atherosclerotic heart disease of native coronary artery with other forms of angina pectoris: Secondary | ICD-10-CM

## 2016-08-06 DIAGNOSIS — G4734 Idiopathic sleep related nonobstructive alveolar hypoventilation: Secondary | ICD-10-CM | POA: Diagnosis not present

## 2016-08-06 NOTE — Patient Instructions (Signed)
We will ask the home oxygen supplier to remove the oxygen from your home Follow up as previously scheduled

## 2016-08-06 NOTE — Assessment & Plan Note (Signed)
[name] came in today to discuss his nocturnal oxygen use today.  He is convinced that his symptoms are worse because of using nocturnal oxygen. Today I explained to him how low his oxygen was on his 2 overnight oxygen tests however he is still convinced that this supplemental oxygen is making him worse. I also explained that his symptoms of sinus congestion could be controlled with humidified oxygen or nasal saline is not interested in using either of these.  Plan: Discontinue oxygen at this time Monitor for symptoms of headache, confusion, fatigue in the mornings  Greater than 50% of this 28 minute visit spent face-to-face

## 2016-08-06 NOTE — Addendum Note (Signed)
Addended by: Velvet Bathe on: 08/06/2016 09:46 AM   Modules accepted: Orders

## 2016-08-06 NOTE — Progress Notes (Signed)
Subjective:    Patient ID: [name], male    DOB: 08-19-1941, 75 y.o.   MRN: [id number]  Synopsis: COPD 07/08/13 PFT: FEV1 1.74L (66% pred, 30% change with BD), mod obst with resp to bronchodilator, moderate decrease diffusion, air-trapping 11/2013 Simple spiro>> clear obstruction, FEV1 1.30 L (47% pred) - trial of symbicort 160 2bid 01/26/15  October through November 2017 he was diagnosed with nocturnal hypoxemia but stopped using supplemental oxygen because of sinus congestion and refused further use.  HPI Chief Complaint  Patient presents with  . Follow-up    pt wants to discuss d/c'ing 02- c/o nasal congstion, lightheadedness after wearing 02.    [name] had to work on Thanksgiving. He has been using the oxygen we prescribed qHS.  He says that he has been noting increased sinus congestion and difficulty breathing due to this.  He felt lightheaded from this and was dizzy while using it as well.  He called over to our office and Dr. Maple Hudson recommended using saline gel.  He has been using the symbicort regularly without difficulty.  He has also been using ventolin prn as well for dyspnea.  In general his breathing is unchanged compared to prior.   Past Medical History:  Diagnosis Date  . CAD (coronary artery disease)    a. s/p stent RCA DES 9/09; b. 2014 Attempted PCI of OM1 @ [facility];  c. 04/2014 Cath: LAD 40-50p, D1 95-99 (chronic), LCX 30-40 inf branch, OM1 CTO, RCA 30-40p, RCA patent stent, EF 35%->Med Rx.  . Chronic combined systolic and diastolic CHF (congestive heart failure) (HCC)    a. EF about 40 to 45% per echo in April 2013;  b. 04/2014 Echo: EF 20-25%, sev LVH, sev glob HK, Gr 1 DD, mildly reduced RV fxn, PASP .  [name] Chronic low back pain   . COPD (chronic obstructive pulmonary disease) (HCC) dx 06/2013   PFTs 07/08/13: mod obst with resp to bronchodilator, moderate decrease diffusion, airtrapping  . Dyslipidemia    a. on statin  . HLD (hyperlipidemia)   . HTN  (hypertension)    a. Reports intolerance to hydralazine; b. no beta blockers 2/2 bradycardia;  c. failed on ACE and ARB.  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 LBBB (left bundle branch block)   . LVH (left ventricular hypertrophy)   . Mixed Ischemic/Non-ischemic Cardiomyopathy    a. 04/2014 Echo: EF 20-25%, sev glob HK.  [name] Noncompliance   . NSVT (nonsustained ventricular tachycardia) (HCC)   . Paroxysmal atrial fibrillation (HCC)    a. identified on device interrogation 01/2016      Review of Systems  Constitutional: Positive for fatigue. Negative for chills and fever.  HENT: Negative for postnasal drip, rhinorrhea and sinus pressure.   Respiratory: Positive for shortness of breath. Negative for cough and wheezing.   Cardiovascular: Negative for chest pain, palpitations and leg swelling.       Objective:   Physical Exam Vitals:   08/06/16 0918  BP: 132/78  Pulse: 62  SpO2: 98%  Weight: 152 lb (68.9 kg)  Height: 5' 8.5" (1.74 m)  RA  Gen: well appearing HENT: OP clear, TM's clear, neck supple PULM: Crackles bilaterally in bases, normal air movment CV: RRR, no mgr, no JVD, no leg edema GI: BS+, soft, nontender Derm: no cyanosis or rash Psyche: normal mood and affect   October 2017 home sleep study showed an AHI of only 2.9, O2 saturation nadir 79% September 2017 chest x-ray showed hyperinflation  02/2016 echo> LV EF 45-50%     Assessment & Plan:  Nocturnal hypoxemia [name] came in today to discuss his nocturnal oxygen use today.  He is convinced that his symptoms are worse because of using nocturnal oxygen. Today I explained to him how low his oxygen was on his 2 overnight oxygen tests however he is still convinced that this supplemental oxygen is making him worse. I also explained that his symptoms of sinus congestion could be controlled with humidified oxygen or nasal saline is not interested in using either of these.  Plan: Discontinue oxygen at this  time Monitor for symptoms of headache, confusion, fatigue in the mornings  Greater than 50% of this 28 minute visit spent face-to-face    Current Outpatient Prescriptions:  .  albuterol (PROVENTIL HFA;VENTOLIN HFA) 108 (90 Base) MCG/ACT inhaler, Inhale 1 puff into the lungs every 6 (six) hours as needed for wheezing or shortness of breath., Disp: 1 Inhaler, Rfl: 5 .  amLODipine (NORVASC) 5 MG tablet, Take 2 tablets (10 mg total) by mouth daily., Disp: 90 tablet, Rfl: 3 .  atorvastatin (LIPITOR) 20 MG tablet, Take 1 tablet (20 mg total) by mouth daily., Disp: 30 tablet, Rfl: 6 .  budesonide-formoterol (SYMBICORT) 160-4.5 MCG/ACT inhaler, Inhale 2 puffs into the lungs 2 (two) times daily., Disp: 1 Inhaler, Rfl: 5 .  clopidogrel (PLAVIX) 75 MG tablet, Take 1 tablet (75 mg total) by mouth daily., Disp: 90 tablet, Rfl: 3 .  isosorbide mononitrate (IMDUR) 60 MG 24 hr tablet, Take 1 tablet (60 mg total) by mouth daily., Disp: 90 tablet, Rfl: 3 .  KLOR-CON M20 20 MEQ tablet, TAKE 2 TABLETS (40 MEQ TOTAL) BY MOUTH DAILY., Disp: 60 tablet, Rfl: 11 .  losartan (COZAAR) 100 MG tablet, Take 1 tablet (100 mg total) by mouth daily., Disp: 90 tablet, Rfl: 3 .  metoprolol (LOPRESSOR) 50 MG tablet, Take 1 tablet (50 mg total) by mouth 2 (two) times daily., Disp: 60 tablet, Rfl: 6 .  nitroGLYCERIN (NITROSTAT) 0.4 MG SL tablet, Place 1 tablet (0.4 mg total) under the tongue every 5 (five) minutes as needed for chest pain., Disp: 25 tablet, Rfl: 3

## 2016-08-08 ENCOUNTER — Telehealth: Payer: Self-pay | Admitting: Pulmonary Disease

## 2016-08-08 NOTE — Telephone Encounter (Signed)
LMTCB

## 2016-08-13 NOTE — Telephone Encounter (Signed)
Called and spoke with Misty Stanley and she stated that the pt called and stated that they should be receiving an order to d/c the humidifier.  Pt stated to Misty Stanley that he felt the humidifier was causing him to feel more dizzy.  Misty Stanley advised the pt that this could be related to sinus issues.  He stated that he will try the humidifier a little bit longer to see if it will help.  Nothing further is needed.

## 2016-08-23 NOTE — Progress Notes (Signed)
[name] Date of Birth: Aug 12, 1941 Medical Record #[id number]  History of Present Illness: [name] is seen back today for follow up of CAD and CHF. He has multiple medical issues which include HTN,  HLD and known CAD. He has a history of tobacco abuse. He has LV dysfunction with ejection fraction of 40-45% in April 2013.  Not able to take higher beta blocker dose due to his resting bradycardia.  He had a Myoview study in March of 2013 which showed normal perfusion. Ejection fraction of 40%. He was admitted to [facility] in December 2014 with chest pain. Cardiac cath showed patent RCA stent. There was a 50% mid LAD lesion. The first diagonal and first OM were occluded. Attempted PCI of the OM but unable to cross with wire. Review of old films here showed that this was a chronic obstruction. He has a history of NSVT. He was admitted in  August 2015 with VDRF with acute pulmonary edema. Echo showed drop in EF to 20-25%. In September 2015 he was admitted with syncope associated with bradycardia and overdiuresis. Diruetics were reduced and coreg was held. Repeat Echo in November 2015 showed EF of 30%. He did undergo ICD/BiV implant  by Dr. Johney Frame.  This summer he complained of dyspnea on exertion. No clinical evidence of CHF. Echo showed EF 45-50%. Global HK. Mild MR. Myoview study showed a fixed inferolateral infarct without ischemia. EF 39%. He ultimately underwent CPX. This was limited by submaximal exercise and severe hypertensive response. There was mild COPD. Exercise training and improved BP control recommended. Toprol was increased.   He was seen by Dr. Kendrick Fries. Started on oxygen at night for hypoxemia at night. States this is very uncomfortable to use even with humidifier. States his breathing is much better with use of Symbicort. BP at home still elevated.   Current Outpatient Prescriptions on File Prior to Visit  Medication Sig Dispense Refill  . albuterol (PROVENTIL  HFA;VENTOLIN HFA) 108 (90 Base) MCG/ACT inhaler Inhale 1 puff into the lungs every 6 (six) hours as needed for wheezing or shortness of breath. 1 Inhaler 5  . atorvastatin (LIPITOR) 20 MG tablet Take 1 tablet (20 mg total) by mouth daily. 30 tablet 6  . budesonide-formoterol (SYMBICORT) 160-4.5 MCG/ACT inhaler Inhale 2 puffs into the lungs 2 (two) times daily. 1 Inhaler 5  . clopidogrel (PLAVIX) 75 MG tablet Take 1 tablet (75 mg total) by mouth daily. 90 tablet 3  . isosorbide mononitrate (IMDUR) 60 MG 24 hr tablet Take 1 tablet (60 mg total) by mouth daily. 90 tablet 3  . KLOR-CON M20 20 MEQ tablet TAKE 2 TABLETS (40 MEQ TOTAL) BY MOUTH DAILY. 60 tablet 11  . losartan (COZAAR) 100 MG tablet Take 1 tablet (100 mg total) by mouth daily. 90 tablet 3  . metoprolol (LOPRESSOR) 50 MG tablet Take 1 tablet (50 mg total) by mouth 2 (two) times daily. 60 tablet 6  . nitroGLYCERIN (NITROSTAT) 0.4 MG SL tablet Place 1 tablet (0.4 mg total) under the tongue every 5 (five) minutes as needed for chest pain. 25 tablet 3   No current facility-administered medications on file prior to visit.     Allergies  Allergen Reactions  . Acyclovir And Related     unknown  . Aspirin Other (See Comments)    GI upset at high doses only.    Past Medical History:  Diagnosis Date  . CAD (coronary artery disease)    a. s/p stent RCA  DES 9/09; b. 2014 Attempted PCI of OM1 @ [place];  c. 04/2014 Cath: LAD 40-50p, D1 95-99 (chronic), LCX 30-40 inf branch, OM1 CTO, RCA 30-40p, RCA patent stent, EF 35%->Med Rx.  . Chronic combined systolic and diastolic CHF (congestive heart failure) (HCC)    a. EF about 40 to 45% per echo in April 2013;  b. 04/2014 Echo: EF 20-25%, sev LVH, sev glob HK, Gr 1 DD, mildly reduced RV fxn, PASP .  [name] Chronic low back pain   . COPD (chronic obstructive pulmonary disease) (HCC) dx 06/2013   PFTs 07/08/13: mod obst with resp to bronchodilator, moderate decrease diffusion, airtrapping  .  Dyslipidemia    a. on statin  . HLD (hyperlipidemia)   . HTN (hypertension)    a. Reports intolerance to hydralazine; b. no beta blockers 2/2 bradycardia;  c. failed on ACE and ARB.  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 LBBB (left bundle branch block)   . LVH (left ventricular hypertrophy)   . Mixed Ischemic/Non-ischemic Cardiomyopathy    a. 04/2014 Echo: EF 20-25%, sev glob HK.  [name] Noncompliance   . NSVT (nonsustained ventricular tachycardia) (HCC)   . Paroxysmal atrial fibrillation (HCC)    a. identified on device interrogation 01/2016    Past Surgical History:  Procedure Laterality Date  . BI-VENTRICULAR IMPLANTABLE CARDIOVERTER DEFIBRILLATOR N/A 08/12/2014   MDT Ovidio Kin XT CRTD implanted by Dr Johney Frame  . CARDIAC CATHETERIZATION     ejection fraction 50%  . LEFT HEART CATHETERIZATION WITH CORONARY ANGIOGRAM N/A 05/08/2014   Procedure: LEFT HEART CATHETERIZATION WITH CORONARY ANGIOGRAM;  Surgeon: Micheline Chapman, MD;  Location: [facility] CATH LAB;  Service: Cardiovascular;  Laterality: N/A;    History  Smoking Status  . Former Smoker  . Packs/day: 0.50  . Years: 56.00  . Types: Cigarettes  . Quit date: 09/10/2012  Smokeless Tobacco  . Never Used    History  Alcohol Use No    Family History  Problem Relation Age of Onset  . Heart attack Neg Hx   . Stroke Neg Hx     Review of Systems: As noted in history of present illness  All other systems were reviewed and are negative.  Physical Exam: BP (!) 160/85   Pulse 75   Ht 5' 8.5" (1.74 m)   Wt 150 lb 12.8 oz (68.4 kg)   BMI 22.60 kg/m  Patient is pleasant and in no acute distress. Skin is warm and dry. Color is normal.  HEENT is unremarkable. Normocephalic/atraumatic. PERRL. Sclera are nonicteric. Neck is supple. No masses. No JVD. Lungs reveal diffuse inspiratory and expiratory wheezes.  Cardiac exam shows a regular rate and rhythm. Normal S1-2. No gallop or murmur. ICD site is healing well. Abdomen is soft.  Extremities are without edema. Gait and ROM are intact. No gross neurologic deficits noted.   LABORATORY DATA:   Lab Results  Component Value Date   WBC 9.4 02/04/2016   HGB 10.7 (L) 02/04/2016   HCT 34.3 (L) 02/04/2016   PLT 218 02/04/2016   GLUCOSE 89 04/17/2016   CHOL 202 (H) 11/29/2015   TRIG 86 11/29/2015   HDL 70 11/29/2015   LDLDIRECT 132.9 06/18/2013   LDLCALC 115 11/29/2015   ALT 11 11/29/2015   AST 18 11/29/2015   NA 143 04/17/2016   K 4.4 04/17/2016   CL 106 04/17/2016   CREATININE 0.83 04/17/2016   BUN 9 04/17/2016   CO2 28 04/17/2016   TSH 2.67 01/31/2016  INR 1.09 06/20/2014   Echo: 02/20/16:Study Conclusions  - Left ventricle: There was moderate concentric hypertrophy.   Systolic function was mildly reduced. The estimated ejection   fraction was in the range of 45% to 50%. Mild diffuse hypokinesis   with no identifiable regional variations. Abnormal relaxation   with increased filling pressures. - Ventricular septum: Septal motion showed paradox. These changes   are consistent with right ventricular pacing. - Mitral valve: There was mild regurgitation  Myoview 02/20/16: Study Highlights    Nuclear stress EF: 39%. Diffuse hypokinesis with asynchronous contraction consistent with pacemaker  There was no ST segment deviation noted during stress.  Defect 1: There is a small defect of moderate severity present in the mid inferolateral location. Question possible focal lateral infarction.  Findings consistent with prior myocardial infarction. No areas of ischemia identified.  This is an intermediate risk study.   Donato SchultzMark Skains, MD   CPX: 07/05/16: Narrative   Referred for: Exertional Dyspnea   Procedure: This patient underwent staged symptom-limited exercise treadmill testing using a modified Naughton protocol with expired gas analysis metabolic evaluation during exercise.  Demographics  Age: 34 Ht. (in.) 68.5 Wt. (lb) 151 BMI: 22.6   Predicted  Peak VO2: 26.6  Gender: Male Ht (cm) 174 Wt. (kg) 68.5    Results  Pre-Exercise PFTs   FVC 2.70 (81%)     FEV1 1.64 (64%)      FEV1/FVC 61 (79%)      MVV 70 (58%)      Exercise Time:  9:06  Speed (mph): 2.0    Grade (%): 10.5    RPE: 17  Reason stopped: Test was ended due to hypertensive response achieving termination criteria prior to maximal exercise.  Additional symptoms: bilateral hip pain (5/10) dyspnea (3/10)  Resting HR: 65 Peak HR: 127  (86% age predicted max HR)  BP rest: 180/66 BP peak: 246/94  Peak VO2: 17.5 (66% predicted peak VO2)  VE/VCO2 slope: 33.4  OUES: 1.94  Peak RER: 0.98  Ventilatory Threshold: 16.5 (62% predicted or measured peak VO2)  Peak RR 34  Peak Ventilation: 44.3  VE/MVV: 63%  PETCO2 at peak: 31  O2pulse: 9  (75% predicted O2pulse)   Interpretation  Notes: Patient gave a very good effort. Pulse-oximetry remained 96% or above for the duration of exercise.   ECG: Resting ECG AV paced rhythm. HR response appropriate. PVCs occasional. There were no sustained arrhythmias and no ST-T changes. BP hypertensive at rest with increasingly significant hypertensive response to incremental exercise.  PFT: Pre-exercise spirometry suggests possible mild restrictive/obstructive patterns. MVV below normal.   CPX: Exercise testing with gas exchange demonstrates a moderately reduced peak VO2 of 17.5 ml/kg/min (66% of the age/gender/weight matched sedentary norms). The RER of 0.98 indicates a submaximal effort. The VE/VCO2 slope is mildly elevated and indicates increased dead space ventiltion. The oxygen uptake efficiency slope (OUES) is moderately reduced. The VO2 at the ventilatory threshold was normal at 62% of the predicted peak VO2. At peak exercise, the ventilation reached 63% of the measured MVV indicating ventilatory reserve remained. The O2pulse (a surrogate for stroke volume) increased with initial exercise but  remained flat at 9 ml/beeat (75% predicted).   Conclusion: The interpretation of this test is limited due to submaximal effort during the exercise. Based on available data, exercise testing with gas exchange demonstrates moderate functional impairment. Pre-exercise spirometry suggests mild obstructive pattern. With significantly hypertensive response to exercise, flat O2 pulse and elevated VE/VCO2 slope patient is most likely  limited due to diastolic dysfunction and possible early pulmonary vascular disease.    Test, report and preliminary impression by: Lesia Hausen, MS, ACSM-RCEP 07/05/2016 9:46 AM  Preliminary CPX Results, Finalized results will be forwarded when completed by interpreting physician.  Agree with above. This is submax test with mild functional limitation that is multifactorial in nature including COPD, deconditioning and severe hypertensive response with mild circulatory limitation. Would consider exercise training program and tighter BP control.   Bensimhon, Daniel,MD 10:24 PM      Assessment / Plan: 1. Chronic systolic and diastolic CHF with mixed cardiomyopathy. Last EF about 40%. Volume status looks good. Continue current   Losartan, lopressor. Not on a diuretic due to history of syncope.  2. Coronary disease with remote stenting of the right coronary in 2009 with a drug-eluting stent. Chronic occlusion of OM. He is asymptomatic. Myoview study 2017 showed no ischemia.  3. Tobacco abuse. States his is no longer smoking. Brething is much better with addition of Symbicort suggesting his dyspnea more related to some COPD.  4. Hyperlipidemia  on Lipitor.   5. COPD with asthma.  6. HTN still suboptimal control. Will increase amlodipine to 10 mg in am and 5 mg in pm.  7. NSVT  8. S/p ICD/BiV pacemaker.    Follow up in 6 months.

## 2016-08-24 ENCOUNTER — Encounter: Payer: Self-pay | Admitting: Cardiology

## 2016-08-24 ENCOUNTER — Ambulatory Visit (INDEPENDENT_AMBULATORY_CARE_PROVIDER_SITE_OTHER): Payer: Medicare Other | Admitting: Cardiology

## 2016-08-24 VITALS — BP 160/85 | HR 75 | Ht 68.5 in | Wt 150.8 lb

## 2016-08-24 DIAGNOSIS — I472 Ventricular tachycardia: Secondary | ICD-10-CM

## 2016-08-24 DIAGNOSIS — I10 Essential (primary) hypertension: Secondary | ICD-10-CM

## 2016-08-24 DIAGNOSIS — I2510 Atherosclerotic heart disease of native coronary artery without angina pectoris: Secondary | ICD-10-CM | POA: Diagnosis not present

## 2016-08-24 DIAGNOSIS — I5042 Chronic combined systolic (congestive) and diastolic (congestive) heart failure: Secondary | ICD-10-CM | POA: Diagnosis not present

## 2016-08-24 DIAGNOSIS — I25118 Atherosclerotic heart disease of native coronary artery with other forms of angina pectoris: Secondary | ICD-10-CM

## 2016-08-24 DIAGNOSIS — I4729 Other ventricular tachycardia: Secondary | ICD-10-CM

## 2016-08-24 DIAGNOSIS — I110 Hypertensive heart disease with heart failure: Secondary | ICD-10-CM

## 2016-08-24 DIAGNOSIS — I119 Hypertensive heart disease without heart failure: Secondary | ICD-10-CM | POA: Insufficient documentation

## 2016-08-24 MED ORDER — AMLODIPINE BESYLATE 5 MG PO TABS: 15.0000 mg | ORAL_TABLET | Freq: Every day | ORAL | 3 refills | Status: DC

## 2016-08-24 NOTE — Patient Instructions (Signed)
Increase the amlodipine to 10 mg in the morning and 5 mg in the evening  Other medications will stay the same.  You need to walk daily to build up your conditioning  I will see you in 6 months.

## 2016-09-13 ENCOUNTER — Telehealth: Payer: Self-pay | Admitting: Pulmonary Disease

## 2016-09-13 DIAGNOSIS — J449 Chronic obstructive pulmonary disease, unspecified: Secondary | ICD-10-CM

## 2016-09-13 NOTE — Telephone Encounter (Signed)
Will hold message until 09/14/2016 per pt request.

## 2016-09-14 NOTE — Telephone Encounter (Signed)
Spoke with pt, who would like to d/c his O2 qhs, due to oxygen causing nasal congestion. Pt states he has tried adding humidifier with no improvement. Pt would like to continue symbicort, as he feels this is helping.  BQ please advise. Thanks.

## 2016-09-14 NOTE — Telephone Encounter (Signed)
lmtcb X1 

## 2016-09-14 NOTE — Telephone Encounter (Signed)
OK to dc

## 2016-09-17 NOTE — Telephone Encounter (Signed)
Called and spoke to pt. Informed him of the recs per BQ. Order placed to d/c O2. Pt verbalized understanding and denied any further questions or concerns at this time.

## 2016-09-30 ENCOUNTER — Other Ambulatory Visit: Payer: Self-pay | Admitting: Physician Assistant

## 2016-10-01 NOTE — Telephone Encounter (Signed)
Rx(s) sent to pharmacy electronically.  

## 2016-10-08 ENCOUNTER — Ambulatory Visit (INDEPENDENT_AMBULATORY_CARE_PROVIDER_SITE_OTHER): Payer: Medicare Other | Admitting: *Deleted

## 2016-10-08 DIAGNOSIS — I255 Ischemic cardiomyopathy: Secondary | ICD-10-CM | POA: Diagnosis not present

## 2016-10-08 NOTE — Progress Notes (Signed)
Remote ICD transmission.   

## 2016-10-09 ENCOUNTER — Encounter: Payer: Self-pay | Admitting: Cardiology

## 2016-10-11 LAB — CUP PACEART REMOTE DEVICE CHECK
Battery Remaining Longevity: 61 mo
Brady Statistic AS VP Percent: 34.47 %
Brady Statistic RA Percent Paced: 62.79 %
Date Time Interrogation Session: 20180127174719
HighPow Impedance: 60 Ohm
Implantable Lead Implant Date: 20151203
Implantable Lead Location: 753858
Implantable Lead Model: 4598
Implantable Lead Model: 6935
Implantable Pulse Generator Implant Date: 20151203
Lead Channel Impedance Value: 304 Ohm
Lead Channel Impedance Value: 323 Ohm
Lead Channel Impedance Value: 342 Ohm
Lead Channel Impedance Value: 513 Ohm
Lead Channel Impedance Value: 532 Ohm
Lead Channel Impedance Value: 532 Ohm
Lead Channel Pacing Threshold Amplitude: 1.125 V
Lead Channel Pacing Threshold Pulse Width: 0.4 ms
Lead Channel Pacing Threshold Pulse Width: 0.4 ms
Lead Channel Sensing Intrinsic Amplitude: 30.5 mV
Lead Channel Setting Pacing Pulse Width: 0.4 ms
MDC IDC LEAD IMPLANT DT: 20151203
MDC IDC LEAD IMPLANT DT: 20151203
MDC IDC LEAD LOCATION: 753859
MDC IDC LEAD LOCATION: 753860
MDC IDC MSMT BATTERY VOLTAGE: 2.98 V
MDC IDC MSMT LEADCHNL LV IMPEDANCE VALUE: 304 Ohm
MDC IDC MSMT LEADCHNL LV IMPEDANCE VALUE: 323 Ohm
MDC IDC MSMT LEADCHNL LV IMPEDANCE VALUE: 323 Ohm
MDC IDC MSMT LEADCHNL LV IMPEDANCE VALUE: 513 Ohm
MDC IDC MSMT LEADCHNL LV IMPEDANCE VALUE: 513 Ohm
MDC IDC MSMT LEADCHNL LV PACING THRESHOLD AMPLITUDE: 1 V
MDC IDC MSMT LEADCHNL LV PACING THRESHOLD PULSEWIDTH: 0.4 ms
MDC IDC MSMT LEADCHNL RA PACING THRESHOLD AMPLITUDE: 0.375 V
MDC IDC MSMT LEADCHNL RA SENSING INTR AMPL: 0.875 mV
MDC IDC MSMT LEADCHNL RA SENSING INTR AMPL: 0.875 mV
MDC IDC MSMT LEADCHNL RV IMPEDANCE VALUE: 266 Ohm
MDC IDC MSMT LEADCHNL RV IMPEDANCE VALUE: 323 Ohm
MDC IDC MSMT LEADCHNL RV SENSING INTR AMPL: 30.5 mV
MDC IDC SET LEADCHNL LV PACING AMPLITUDE: 2 V
MDC IDC SET LEADCHNL RA PACING AMPLITUDE: 1.5 V
MDC IDC SET LEADCHNL RV PACING AMPLITUDE: 2.25 V
MDC IDC SET LEADCHNL RV PACING PULSEWIDTH: 0.4 ms
MDC IDC SET LEADCHNL RV SENSING SENSITIVITY: 0.3 mV
MDC IDC STAT BRADY AP VP PERCENT: 63.72 %
MDC IDC STAT BRADY AP VS PERCENT: 0.72 %
MDC IDC STAT BRADY AS VS PERCENT: 1.09 %
MDC IDC STAT BRADY RV PERCENT PACED: 96.17 %

## 2016-10-23 ENCOUNTER — Encounter: Payer: Self-pay | Admitting: Cardiology

## 2016-10-25 ENCOUNTER — Telehealth: Payer: Self-pay | Admitting: Pulmonary Disease

## 2016-10-25 NOTE — Telephone Encounter (Signed)
Spoke with pt. And he stated he has not heard anything from [employer] about picking it up and he states he has now received a bill. Due to it being after 5 will contact them in the am

## 2016-10-26 NOTE — Telephone Encounter (Signed)
lmom to make the pt aware that [employer] will be contacting him to pick up the oxygen.

## 2016-11-05 ENCOUNTER — Ambulatory Visit: Payer: Self-pay | Admitting: Pulmonary Disease

## 2016-12-05 ENCOUNTER — Ambulatory Visit: Payer: Self-pay | Admitting: Pulmonary Disease

## 2016-12-20 ENCOUNTER — Other Ambulatory Visit: Payer: Self-pay | Admitting: Cardiology

## 2016-12-21 NOTE — Telephone Encounter (Signed)
REFILL 

## 2017-01-05 ENCOUNTER — Other Ambulatory Visit: Payer: Self-pay | Admitting: Pulmonary Disease

## 2017-01-07 ENCOUNTER — Telehealth: Payer: Self-pay | Admitting: Cardiology

## 2017-01-07 ENCOUNTER — Ambulatory Visit (INDEPENDENT_AMBULATORY_CARE_PROVIDER_SITE_OTHER): Payer: Medicare Other | Admitting: *Deleted

## 2017-01-07 DIAGNOSIS — I255 Ischemic cardiomyopathy: Secondary | ICD-10-CM

## 2017-01-07 NOTE — Telephone Encounter (Signed)
LMOVM reminding pt to send remote transmission.   

## 2017-01-08 ENCOUNTER — Encounter: Payer: Self-pay | Admitting: Cardiology

## 2017-01-08 LAB — CUP PACEART REMOTE DEVICE CHECK
Battery Remaining Longevity: 55 mo
Battery Voltage: 2.97 V
Brady Statistic AP VS Percent: 0.99 %
Brady Statistic AS VS Percent: 3.01 %
HighPow Impedance: 64 Ohm
Implantable Lead Implant Date: 20151203
Implantable Lead Implant Date: 20151203
Implantable Lead Location: 753858
Implantable Lead Model: 4598
Implantable Lead Model: 5076
Implantable Lead Model: 6935
Implantable Pulse Generator Implant Date: 20151203
Lead Channel Impedance Value: 323 Ohm
Lead Channel Impedance Value: 323 Ohm
Lead Channel Impedance Value: 342 Ohm
Lead Channel Impedance Value: 342 Ohm
Lead Channel Impedance Value: 380 Ohm
Lead Channel Impedance Value: 532 Ohm
Lead Channel Impedance Value: 570 Ohm
Lead Channel Pacing Threshold Amplitude: 1.25 V
Lead Channel Pacing Threshold Pulse Width: 0.4 ms
Lead Channel Pacing Threshold Pulse Width: 0.4 ms
Lead Channel Sensing Intrinsic Amplitude: 0.875 mV
Lead Channel Sensing Intrinsic Amplitude: 20.375 mV
Lead Channel Sensing Intrinsic Amplitude: 20.375 mV
Lead Channel Setting Pacing Amplitude: 2.25 V
Lead Channel Setting Pacing Pulse Width: 0.4 ms
Lead Channel Setting Pacing Pulse Width: 0.4 ms
MDC IDC LEAD IMPLANT DT: 20151203
MDC IDC LEAD LOCATION: 753859
MDC IDC LEAD LOCATION: 753860
MDC IDC MSMT LEADCHNL LV IMPEDANCE VALUE: 323 Ohm
MDC IDC MSMT LEADCHNL LV IMPEDANCE VALUE: 323 Ohm
MDC IDC MSMT LEADCHNL LV IMPEDANCE VALUE: 532 Ohm
MDC IDC MSMT LEADCHNL LV IMPEDANCE VALUE: 570 Ohm
MDC IDC MSMT LEADCHNL LV IMPEDANCE VALUE: 570 Ohm
MDC IDC MSMT LEADCHNL LV PACING THRESHOLD AMPLITUDE: 1.125 V
MDC IDC MSMT LEADCHNL RA PACING THRESHOLD AMPLITUDE: 0.375 V
MDC IDC MSMT LEADCHNL RA PACING THRESHOLD PULSEWIDTH: 0.4 ms
MDC IDC MSMT LEADCHNL RA SENSING INTR AMPL: 0.875 mV
MDC IDC MSMT LEADCHNL RV IMPEDANCE VALUE: 304 Ohm
MDC IDC SESS DTM: 20180430203315
MDC IDC SET LEADCHNL RA PACING AMPLITUDE: 1.5 V
MDC IDC SET LEADCHNL RV PACING AMPLITUDE: 2.5 V
MDC IDC SET LEADCHNL RV SENSING SENSITIVITY: 0.3 mV
MDC IDC STAT BRADY AP VP PERCENT: 73.23 %
MDC IDC STAT BRADY AS VP PERCENT: 22.77 %
MDC IDC STAT BRADY RA PERCENT PACED: 70.02 %
MDC IDC STAT BRADY RV PERCENT PACED: 92.74 %

## 2017-01-08 NOTE — Progress Notes (Signed)
Remote ICD transmission.   

## 2017-01-17 ENCOUNTER — Encounter: Payer: Self-pay | Admitting: Pulmonary Disease

## 2017-01-17 ENCOUNTER — Ambulatory Visit (INDEPENDENT_AMBULATORY_CARE_PROVIDER_SITE_OTHER): Payer: Medicare Other | Admitting: Pulmonary Disease

## 2017-01-17 VITALS — BP 150/84 | HR 75 | Ht 68.5 in | Wt 147.0 lb

## 2017-01-17 DIAGNOSIS — I255 Ischemic cardiomyopathy: Secondary | ICD-10-CM | POA: Diagnosis not present

## 2017-01-17 DIAGNOSIS — J441 Chronic obstructive pulmonary disease with (acute) exacerbation: Secondary | ICD-10-CM | POA: Insufficient documentation

## 2017-01-17 DIAGNOSIS — J301 Allergic rhinitis due to pollen: Secondary | ICD-10-CM

## 2017-01-17 DIAGNOSIS — J309 Allergic rhinitis, unspecified: Secondary | ICD-10-CM | POA: Insufficient documentation

## 2017-01-17 DIAGNOSIS — R0600 Dyspnea, unspecified: Secondary | ICD-10-CM | POA: Diagnosis not present

## 2017-01-17 DIAGNOSIS — J449 Chronic obstructive pulmonary disease, unspecified: Secondary | ICD-10-CM

## 2017-01-17 HISTORY — DX: Chronic obstructive pulmonary disease with (acute) exacerbation: J44.1

## 2017-01-17 MED ORDER — ALBUTEROL SULFATE (2.5 MG/3ML) 0.083% IN NEBU: 2.5000 mg | INHALATION_SOLUTION | Freq: Four times a day (QID) | RESPIRATORY_TRACT | 11 refills | Status: DC | PRN

## 2017-01-17 MED ORDER — HYDROCOD POLST-CPM POLST ER 10-8 MG/5ML PO SUER: 5.0000 mL | Freq: Two times a day (BID) | ORAL | 0 refills | Status: DC | PRN

## 2017-01-17 MED ORDER — PREDNISONE 20 MG PO TABS: 20.0000 mg | ORAL_TABLET | Freq: Every day | ORAL | 0 refills | Status: DC

## 2017-01-17 NOTE — Progress Notes (Signed)
Subjective:    Patient ID: [name], male    DOB: 1941/01/29, 76 y.o.   MRN: [id number]  Synopsis: COPD October through November 2017 he was diagnosed with nocturnal hypoxemia but stopped using supplemental oxygen because of sinus congestion and refused further use.  HPI Chief Complaint  Patient presents with  . Follow-up    pt c/o sinus congestion, pnd, prod cough with yellow mucus X1 week.  Pt also notes loss of appetite, indigestion, unintentional weight loss.    [name] says he has been bothered by the following for the last several days: > stuffy nose > congestion in his sinuses > cough > itchy eyes > scratchy throat  He has been feeling chest congestion as well.   This started last Friday, prior to that he was well.  No sick contacts.  He says taht there is a lot of dust flying around him at work.    He denies fever or chills.  He has been a bit more dyspneic with this.  He continues to take Symbicort regularly.    Past Medical History:  Diagnosis Date  . CAD (coronary artery disease)    a. s/p stent RCA DES 9/09; b. 2014 Attempted PCI of OM1 @ [facility];  c. 04/2014 Cath: LAD 40-50p, D1 95-99 (chronic), LCX 30-40 inf branch, OM1 CTO, RCA 30-40p, RCA patent stent, EF 35%->Med Rx.  . Chronic combined systolic and diastolic CHF (congestive heart failure) (HCC)    a. EF about 40 to 45% per echo in April 2013;  b. 04/2014 Echo: EF 20-25%, sev LVH, sev glob HK, Gr 1 DD, mildly reduced RV fxn, PASP .  [name] Chronic low back pain   . COPD (chronic obstructive pulmonary disease) (HCC) dx 06/2013   PFTs 07/08/13: mod obst with resp to bronchodilator, moderate decrease diffusion, airtrapping  . Dyslipidemia    a. on statin  . HLD (hyperlipidemia)   . HTN (hypertension)    a. Reports intolerance to hydralazine; b. no beta blockers 2/2 bradycardia;  c. failed on ACE and ARB.  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 LBBB (left bundle branch block)   . LVH (left  ventricular hypertrophy)   . Mixed Ischemic/Non-ischemic Cardiomyopathy    a. 04/2014 Echo: EF 20-25%, sev glob HK.  [name] Noncompliance   . NSVT (nonsustained ventricular tachycardia) (HCC)   . Paroxysmal atrial fibrillation (HCC)    a. identified on device interrogation 01/2016      Review of Systems  Constitutional: Positive for fatigue. Negative for chills and fever.  HENT: Negative for postnasal drip, rhinorrhea and sinus pressure.   Respiratory: Positive for shortness of breath. Negative for cough and wheezing.   Cardiovascular: Negative for chest pain, palpitations and leg swelling.       Objective:   Physical Exam Vitals:   01/17/17 1555  BP: (!) 150/84  Pulse: 75  SpO2: 98%  Weight: 147 lb (66.7 kg)  Height: 5' 8.5" (1.74 m)  RA  Gen: mildly ill appearing HENT: OP clear, TM's clear, neck supple PULM: Wheezing bilaterally B, normal percussion CV: RRR, no mgr, trace edema GI: BS+, soft, nontender Derm: no cyanosis or rash Psyche: normal mood and affect   07/08/13 PFT: FEV1 1.74L (66% pred, 30% change with BD), mod obst with resp to bronchodilator, moderate decrease diffusion, air-trapping 11/2013 Simple spiro>> clear obstruction, FEV1 1.30 L (47% pred) October 2017 home sleep study showed an AHI of only 2.9, O2 saturation nadir 79% September 2017 chest  x-ray showed hyperinflation 02/2016 echo> LV EF 45-50%     Assessment & Plan:  COPD with acute exacerbation (HCC) Unfortunately [name] is having a severe episode of allergic rhinitis leading to an acute exacerbation of his COPD.  I doubt pneumonia or infectious causes it sounds as if this all started with heavy dust exposure at work.  Plan: Chest x-ray Prednisone 5 days Albuterol nebulized 2-3 times a day as needed for wheezing and shortness of breath Continue Symbicort Call us if no improvement  Allergic rhinitis Use Neil Med rinses with distilled water at least twice per day using the instructions on the  package. 1/2 hour after using the [facility] Med rinse, use Nasacort two puffs in each nostril once per day.  Remember that the Nasacort can take 1-2 weeks to work after regular use. Use generic zyrtec (cetirizine) every day.  If this doesn't help, then stop taking it and use chlorpheniramine-phenylephrine combination tablets.      Current Outpatient Prescriptions:  .  albuterol (PROVENTIL HFA;VENTOLIN HFA) 108 (90 Base) MCG/ACT inhaler, Inhale 1 puff into the lungs every 6 (six) hours as needed for wheezing or shortness of breath., Disp: 1 Inhaler, Rfl: 5 .  amLODipine (NORVASC) 5 MG tablet, Take 3 tablets (15 mg total) by mouth daily. Take 2 tablets in the morning and 1 tablet in the evening, Disp: 90 tablet, Rfl: 3 .  atorvastatin (LIPITOR) 20 MG tablet, TAKE 1 TABLET EVERY DAY, Disp: 30 tablet, Rfl: 2 .  clopidogrel (PLAVIX) 75 MG tablet, Take 1 tablet (75 mg total) by mouth daily., Disp: 90 tablet, Rfl: 3 .  isosorbide mononitrate (IMDUR) 60 MG 24 hr tablet, Take 1 tablet (60 mg total) by mouth daily., Disp: 90 tablet, Rfl: 3 .  KLOR-CON M20 20 MEQ tablet, TAKE 2 TABLETS (40 MEQ TOTAL) BY MOUTH DAILY., Disp: 60 tablet, Rfl: 10 .  losartan (COZAAR) 100 MG tablet, Take 1 tablet (100 mg total) by mouth daily., Disp: 90 tablet, Rfl: 3 .  nitroGLYCERIN (NITROSTAT) 0.4 MG SL tablet, Place 1 tablet (0.4 mg total) under the tongue every 5 (five) minutes as needed for chest pain., Disp: 25 tablet, Rfl: 3 .  SYMBICORT 160-4.5 MCG/ACT inhaler, INHALE 2 PUFFS INTO THE LUNGS 2 (TWO) TIMES DAILY., Disp: 10.2 g, Rfl: 5 .  albuterol (PROVENTIL) (2.5 MG/3ML) 0.083% nebulizer solution, Take 3 mLs (2.5 mg total) by nebulization every 6 (six) hours as needed for wheezing or shortness of breath., Disp: 360 mL, Rfl: 11 .  chlorpheniramine-HYDROcodone (TUSSIONEX PENNKINETIC ER) 10-8 MG/5ML SUER, Take 5 mLs by mouth every 12 (twelve) hours as needed for cough., Disp: 140 mL, Rfl: 0 .  metoprolol (LOPRESSOR) 50 MG tablet,  Take 1 tablet (50 mg total) by mouth 2 (two) times daily., Disp: 60 tablet, Rfl: 6 .  predniSONE (DELTASONE) 20 MG tablet, Take 1 tablet (20 mg total) by mouth daily with breakfast., Disp: 5 tablet, Rfl: 0

## 2017-01-17 NOTE — Patient Instructions (Addendum)
For your sinuses: Use Neil Med rinses with distilled water at least twice per day using the instructions on the package. 1/2 hour after using the [facility] Med rinse, use Nasacort two puffs in each nostril once per day.  Remember that the Nasacort can take 1-2 weeks to work after regular use. Use generic zyrtec (cetirizine) every day.  If this doesn't help, then stop taking it and use chlorpheniramine-phenylephrine combination tablets.  For your cough: Take prednisone 20mg  daily 5 days Take tussionex as needed for cough, do not take this medicine and drive Use the albuterol 2-3 times a day as needed for the cough and wheeze  We will see you back in 3 months or sooner if needed

## 2017-01-17 NOTE — Assessment & Plan Note (Signed)
Use Neil Med rinses with distilled water at least twice per day using the instructions on the package. 1/2 hour after using the Neil Med rinse, use Nasacort two puffs in each nostril once per day.  Remember that the Nasacort can take 1-2 weeks to work after regular use. Use generic zyrtec (cetirizine) every day.  If this doesn't help, then stop taking it and use chlorpheniramine-phenylephrine combination tablets.   

## 2017-01-17 NOTE — Assessment & Plan Note (Signed)
Unfortunately [name] is having a severe episode of allergic rhinitis leading to an acute exacerbation of his COPD.  I doubt pneumonia or infectious causes it sounds as if this all started with heavy dust exposure at work.  Plan: Chest x-ray Prednisone 5 days Albuterol nebulized 2-3 times a day as needed for wheezing and shortness of breath Continue Symbicort Call us if no improvement

## 2017-01-18 ENCOUNTER — Telehealth: Payer: Self-pay

## 2017-01-18 MED ORDER — ALBUTEROL SULFATE (2.5 MG/3ML) 0.083% IN NEBU: 2.5000 mg | INHALATION_SOLUTION | Freq: Four times a day (QID) | RESPIRATORY_TRACT | 11 refills | Status: DC | PRN

## 2017-01-18 NOTE — Telephone Encounter (Signed)
PA sent for albuterol nebs. Pharmacist said to resend with the diagnosis code. Resent Rx. R06.00

## 2017-01-22 ENCOUNTER — Encounter: Payer: Self-pay | Admitting: Cardiology

## 2017-01-22 NOTE — Progress Notes (Signed)
Letter  

## 2017-01-25 NOTE — Telephone Encounter (Signed)
[facility] still need a PA on Albuterol nebs [phone] id# [id number]

## 2017-01-25 NOTE — Telephone Encounter (Signed)
Called pharmacy and spoke with Dorene Grebe because according to previous note in this same message PA was done. Proved Natalie with the Dx code again and she states she thinks the problem is they do not have pt's insurance card. She states she will reach out to pt for this. She states they do not need anything further nothing further is needed.

## 2017-02-04 ENCOUNTER — Other Ambulatory Visit: Payer: Self-pay | Admitting: Cardiology

## 2017-02-06 DIAGNOSIS — R42 Dizziness and giddiness: Secondary | ICD-10-CM | POA: Insufficient documentation

## 2017-02-06 DIAGNOSIS — I10 Essential (primary) hypertension: Secondary | ICD-10-CM | POA: Insufficient documentation

## 2017-02-06 DIAGNOSIS — I2510 Atherosclerotic heart disease of native coronary artery without angina pectoris: Secondary | ICD-10-CM | POA: Insufficient documentation

## 2017-02-06 DIAGNOSIS — R55 Syncope and collapse: Secondary | ICD-10-CM | POA: Insufficient documentation

## 2017-02-17 NOTE — Progress Notes (Signed)
Cardiology Office Note    Date:  02/19/2017   ID:  [name], DOB 09/24/1940, MRN [id number]  PCP:  Patient, No Pcp Per  Cardiologist: Dr. Swaziland  Chief Complaint  Patient presents with  . Follow-up    pt states have left shoulder pain, states he doesn't know if its a pinch nerve     History of Present Illness:    [name] is a 76 y.o. male with past medical history of CAD (s/p DES to RCA in 2009, patent by cath in 2015 with known chronic 99% occlusion of D1, NST in 2017 showing no ischemia), chronic combined systolic and diastolic CHF (EF 40-98% by echo in 2015, at 50-55% by echo in 01/2017), ischemic cardiomyopathy (s/p ICD placement in 2015), HTN, HLD, NSVT and COPD who presents to the office today for hospital follow-up.   He was examined by Dr. Swaziland in 08/2016 and reported improvement in his respiratory status since being placed on nocturnal O2. He was recently admitted to [facility] for evaluation of near syncope. This was thought to be related to orthostasis and hypotension. A Head CT was obtained and showed no acute abnormalities. An echocardiogram was performed which showed an improved EF of 50-55% with no significant valve abnormalities. Per the patient's report, his ICD was interrogated and showed no significant abnormalities but I cannot mind mention of this report in Care Everywhere.  In talking with the patient today, he reports overall doing well since his recent hospitalization. Reports blood pressure had been soft at 90/60 at the time of his arrival to the ED. He reports he had been taking 20 mg of Amlodipine in addition to Losartan and Lopresor as instructed by his PCP and believes this contributed to his symptoms.  He denies any repeat episodes of dizziness, lightheadedness, or presyncope. No recent chest discomfort, dyspnea on exertion, orthopnea, PND, or lower extremity edema. He has been checking his blood pressure regularly at home and notes systolic  readings in the 120's to 130's. His main concern today is left shoulder pain with a numbness and tingling that goes down to his elbow. He notices this more when lifting his arm above his head and requests a steroid injection be administered today. He has been experiencing this pain for several months with no acute worsening of his symptoms.    Past Medical History:  Diagnosis Date  . CAD (coronary artery disease)    a. s/p stent RCA DES 9/09; b. 2014 Attempted PCI of OM1 @ [place] [facility];  c. 04/2014 Cath: LAD 40-50p, D1 95-99 (chronic), LCX 30-40 inf branch, OM1 CTO, RCA 30-40p, RCA patent stent, EF 35%->Med Rx.  . Chronic combined systolic and diastolic CHF (congestive heart failure) (HCC)    a. EF about 40 to 45% per echo in April 2013;  b. 04/2014 Echo: EF 20-25%, sev LVH, sev glob HK, Gr 1 DD, mildly reduced RV fxn, PASP . c. 01/2017: EF improved to 50-55%.   . Chronic low back pain   . COPD (chronic obstructive pulmonary disease) (HCC) dx 06/2013   PFTs 07/08/13: mod obst with resp to bronchodilator, moderate decrease diffusion, airtrapping  . Dyslipidemia    a. on statin  . HLD (hyperlipidemia)   . HTN (hypertension)    a. Reports intolerance to hydralazine; b. no beta blockers 2/2 bradycardia;  c. failed on ACE and ARB.  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 LBBB (left bundle branch block)   . LVH (left  ventricular hypertrophy)   . Mixed Ischemic/Non-ischemic Cardiomyopathy    a. 04/2014 Echo: EF 20-25%, sev glob HK.  [name] Noncompliance   . NSVT (nonsustained ventricular tachycardia) (HCC)   . Paroxysmal atrial fibrillation (HCC)    a. identified on device interrogation 01/2016    Past Surgical History:  Procedure Laterality Date  . BI-VENTRICULAR IMPLANTABLE CARDIOVERTER DEFIBRILLATOR N/A 08/12/2014   MDT Ovidio Kin XT CRTD implanted by Dr Johney Frame  . CARDIAC CATHETERIZATION     ejection fraction 50%  . LEFT HEART CATHETERIZATION WITH CORONARY ANGIOGRAM N/A 05/08/2014    Procedure: LEFT HEART CATHETERIZATION WITH CORONARY ANGIOGRAM;  Surgeon: Micheline Chapman, MD;  Location[facility] CATH LAB;  Service: Cardiovascular;  Laterality: N/A;    Current Medications: Outpatient Medications Prior to Visit  Medication Sig Dispense Refill  . albuterol (PROVENTIL HFA;VENTOLIN HFA) 108 (90 Base) MCG/ACT inhaler Inhale 1 puff into the lungs every 6 (six) hours as needed for wheezing or shortness of breath. 1 Inhaler 5  . albuterol (PROVENTIL) (2.5 MG/3ML) 0.083% nebulizer solution Take 3 mLs (2.5 mg total) by nebulization every 6 (six) hours as needed for wheezing or shortness of breath. 360 mL 11  . amLODipine (NORVASC) 5 MG tablet Take 3 tablets (15 mg total) by mouth daily. Take 2 tablets in the morning and 1 tablet in the evening 90 tablet 3  . atorvastatin (LIPITOR) 20 MG tablet TAKE 1 TABLET EVERY DAY 30 tablet 2  . clopidogrel (PLAVIX) 75 MG tablet TAKE 1 TABLET (75 MG TOTAL) BY MOUTH DAILY. 30 tablet 0  . isosorbide mononitrate (IMDUR) 60 MG 24 hr tablet Take 1 tablet (60 mg total) by mouth daily. 90 tablet 3  . KLOR-CON M20 20 MEQ tablet TAKE 2 TABLETS (40 MEQ TOTAL) BY MOUTH DAILY. 60 tablet 10  . losartan (COZAAR) 100 MG tablet Take 1 tablet (100 mg total) by mouth daily. 90 tablet 3  . nitroGLYCERIN (NITROSTAT) 0.4 MG SL tablet Place 1 tablet (0.4 mg total) under the tongue every 5 (five) minutes as needed for chest pain. 25 tablet 3  . SYMBICORT 160-4.5 MCG/ACT inhaler INHALE 2 PUFFS INTO THE LUNGS 2 (TWO) TIMES DAILY. 10.2 g 5  . chlorpheniramine-HYDROcodone (TUSSIONEX PENNKINETIC ER) 10-8 MG/5ML SUER Take 5 mLs by mouth every 12 (twelve) hours as needed for cough. 140 mL 0  . metoprolol (LOPRESSOR) 50 MG tablet Take 1 tablet (50 mg total) by mouth 2 (two) times daily. 60 tablet 6  . predniSONE (DELTASONE) 20 MG tablet Take 1 tablet (20 mg total) by mouth daily with breakfast. 5 tablet 0   No facility-administered medications prior to visit.      Allergies:    Acyclovir and related and Aspirin   Social History   Social History  . Marital status: Married    Spouse name: N/A  . Number of children: 3  . Years of education: N/A   Occupational History  . security guard.    Social History Main Topics  . Smoking status: Former Smoker    Packs/day: 0.50    Years: 56.00    Types: Cigarettes    Quit date: 09/10/2012  . Smokeless tobacco: Never Used  . Alcohol use No  . Drug use: No  . Sexual activity: Not Currently   Other Topics Concern  . None   Social History Narrative            Family History:  The patient's family history is not on file.   Review of Systems:  Please see the history of present illness.     General:  No chills, fever, night sweats or weight changes.  Cardiovascular:  No chest pain, dyspnea on exertion, edema, orthopnea, palpitations, paroxysmal nocturnal dyspnea. Dermatological: No rash, lesions/masses Respiratory: No cough, dyspnea Urologic: No hematuria, dysuria MSK: Positive for left shoulder pain.  Abdominal:   No nausea, vomiting, diarrhea, bright red blood per rectum, melena, or hematemesis Neurologic:  No visual changes, wkns, changes in mental status. All other systems reviewed and are otherwise negative except as noted above.   Physical Exam:    VS:  BP (!) 159/83   Pulse 64   Ht 5\' 8"  (1.727 m)   Wt 151 lb 3.2 oz (68.6 kg)   SpO2 96%   BMI 22.99 kg/m    General: Well developed, well nourished,male appearing in no acute distress. Head: Normocephalic, atraumatic, sclera non-icteric, no xanthomas, nares are without discharge.  Neck: No carotid bruits. JVD not elevated.  Lungs: Respirations regular and unlabored, without wheezes or rales.  Heart: Regular rate and rhythm with no ectopic beats noted. No S3 or S4.  No murmur, no rubs, or gallops appreciated. Abdomen: Soft, non-tender, non-distended with normoactive bowel sounds. No hepatomegaly. No rebound/guarding. No obvious abdominal  masses. Msk:  Strength and tone appear normal for age. No joint deformities or effusions. Extremities: No clubbing or cyanosis. No lower extremity edema.  Distal pedal pulses are 2+ bilaterally. Neuro: Alert and oriented X 3. Moves all extremities spontaneously. No focal deficits noted. Psych:  Responds to questions appropriately with a normal affect. Skin: No rashes or lesions noted  Wt Readings from Last 3 Encounters:  02/19/17 151 lb 3.2 oz (68.6 kg)  01/17/17 147 lb (66.7 kg)  08/24/16 150 lb 12.8 oz (68.4 kg)     Studies/Labs Reviewed:   EKG:  EKG is not ordered today.   Recent Labs: 04/17/2016: Brain Natriuretic Peptide 95.4; BUN 9; Creat 0.83; Magnesium 1.7; Potassium 4.4; Sodium 143   Lipid Panel    Component Value Date/Time   CHOL 202 (H) 11/29/2015 0908   TRIG 86 11/29/2015 0908   HDL 70 11/29/2015 0908   CHOLHDL 2.9 11/29/2015 0908   VLDL 17 11/29/2015 0908   LDLCALC 115 11/29/2015 0908   LDLDIRECT 132.9 06/18/2013 1038    Additional studies/ records that were reviewed today include:   Echocardiogram: 02/07/2017 Findings Mitral Valve Mild mitral annular calcification. Trace to mild mitral regurgitation. Aortic Valve The aortic valve leaflets were not well visualized. The aortic valve appears to be trileaflet. There is mild aortic sclerosis noted, with no evidence of stenosis. Tricuspid Valve Tricuspid valve is structurally normal. No significant tricuspid regurgitation . Pulmonic Valve The pulmonic valve was not well visualized No Doppler evidence of pulmonic stenosis or insufficiency. Left Atrium Normal size left atrium. Left atrial volume index of 25 ml per meters squared BSA. Left Ventricle There is moderate left ventricular hypertrophy. Normal left ventricular systolic function with no appreciable segmental abnormality. Ejection fraction is visually estimated at 50-55% Right Atrium Normal right atrium. Right Ventricle Normal right ventricular  size and function. Pericardial Effusion No evidence of pericardial effusion. Pleural Effusion No evidence of pleural effusion. Miscellaneous The IVC is normal The aorta is within normal limits.  Assessment:    1. Coronary artery disease involving native coronary artery of native heart without angina pectoris   2. Chronic combined systolic and diastolic CHF (congestive heart failure) (HCC)   3. Ischemic cardiomyopathy   4. Essential hypertension  5. Chronic left shoulder pain      Plan:   In order of problems listed above:  1. CAD - s/p DES to RCA in 2009, patent by cath in 2015 with known chronic 99% occlusion of D1.  - recent NST in 2017 showed no evidence of ischemia. He denies any recent anginal symptoms. - continue Plavix, statin, Imdur, and BB therapy.   2. Chronic combined systolic and diastolic CHF - EF 20-25% by echo in 2015, improved to 50-55% by echo in 01/2017. - he does not appear volume overloaded by physical examination.  - continue Lopressor 50mg  BID and Losartan 100mg  daily.   3. Ischemic cardiomyopathy - s/p ICD placement in 2015 - device interrogation in 12/2016 showed normal device function with stable Optivol. 5 episodes of NSVT noted lasting from 5-19 beats,   4. HTN - recently hospitalized for presyncope, though to be secondary to orthostasis and hypotension. Reports his blood pressure had been soft at 90/60 at the time of his arrival to the ED as he had been taking 20 mg of Amlodipine in addition to Losartan and Lopresor as instructed by his PCP and believes this contributed to his symptoms. - He has been checking his blood pressure regularly at home and notes systolic readings in the 120's to 130's.  - I informed him the max dose of Amlodipine is 10mg  daily and he should only take this amount. Continue Lopressor 50mg  BID and Losartan 100mg  daily. He will check his BP readings regularly at home and call back if SBP remains elevated > 140 or DBP >  90.  5. Chronic Left Shoulder Pain - this has been occurring for several months and is worse with lifting his arm over his head.  - I have recommended he reestablish care with a PCP in regards to further evaluation of this and to see if a Orthopedic referral is warranted.    Medication Adjustments/Labs and Tests Ordered: Current medicines are reviewed at length with the patient today.  Concerns regarding medicines are outlined above.  Medication changes, Labs and Tests ordered today are listed in the Patient Instructions below. Patient Instructions  Medication Instructions:  CONTINUE AMLODIPINE  ### NOT TO EXCEED 10MG  DAILY### If you need a refill on your cardiac medications before your next appointment, please call your pharmacy.  Follow-Up: Your physician wants you to follow-up in: 6 MONTHS WITH DR Swaziland You will receive a reminder letter in the mail two months in advance. If you don't receive a letter, please call our office OCTOBER to schedule the DECEMBER follow-up appointment.  Thank you for choosing [facility] at [employer], [place], [place]  02/19/2017 8:09 PM    [facility] [address] [place], [address] [place], [place]  [address] Phone: [phone]; Fax: [phone]  [address], [address] [place], [place] [address] Phone: [phone]

## 2017-02-19 ENCOUNTER — Ambulatory Visit (INDEPENDENT_AMBULATORY_CARE_PROVIDER_SITE_OTHER): Payer: Medicare Other | Admitting: Student

## 2017-02-19 ENCOUNTER — Encounter: Payer: Self-pay | Admitting: Student

## 2017-02-19 VITALS — BP 159/83 | HR 64 | Ht 68.0 in | Wt 151.2 lb

## 2017-02-19 DIAGNOSIS — I5042 Chronic combined systolic (congestive) and diastolic (congestive) heart failure: Secondary | ICD-10-CM | POA: Diagnosis not present

## 2017-02-19 DIAGNOSIS — I10 Essential (primary) hypertension: Secondary | ICD-10-CM | POA: Diagnosis not present

## 2017-02-19 DIAGNOSIS — G8929 Other chronic pain: Secondary | ICD-10-CM | POA: Diagnosis not present

## 2017-02-19 DIAGNOSIS — I2510 Atherosclerotic heart disease of native coronary artery without angina pectoris: Secondary | ICD-10-CM

## 2017-02-19 DIAGNOSIS — I255 Ischemic cardiomyopathy: Secondary | ICD-10-CM

## 2017-02-19 DIAGNOSIS — M25512 Pain in left shoulder: Secondary | ICD-10-CM

## 2017-02-19 NOTE — Patient Instructions (Signed)
Medication Instructions:  CONTINUE AMLODIPINE  ### NOT TO EXCEED 10MG  DAILY### If you need a refill on your cardiac medications before your next appointment, please call your pharmacy.  Follow-Up: Your physician wants you to follow-up in: 6 MONTHS WITH DR Swaziland You will receive a reminder letter in the mail two months in advance. If you don't receive a letter, please call our office OCTOBER to schedule the DECEMBER follow-up appointment.  Thank you for choosing [facility] at [facility]!!

## 2017-03-04 ENCOUNTER — Other Ambulatory Visit: Payer: Self-pay | Admitting: Cardiology

## 2017-03-04 NOTE — Telephone Encounter (Signed)
REFILL 

## 2017-03-29 ENCOUNTER — Encounter: Payer: Self-pay | Admitting: Family Medicine

## 2017-04-07 IMAGING — CT CT MAXILLOFACIAL W/ CM
3 series · 15 of 47 positions shown, 18 images · IV contrast (iopamidol)
Comparison: CT of the head performed 05/23/2008

CLINICAL DATA: Acute onset of upper dental pain and swelling.
Assess for abscess. Initial encounter.

EXAM:
CT MAXILLOFACIAL WITH CONTRAST
TECHNIQUE: Multidetector CT imaging of the maxillofacial structures was
performed with intravenous contrast. Multiplanar CT image
reconstructions were also generated. A small metallic BB was placed
on the right temple in order to reliably differentiate right from
left.
CONTRAST:  75mL XH4WUO-HUU IOPAMIDOL (XH4WUO-HUU) INJECTION 61%

[Series 2: facialbone 2.0 st · axial · 0.35mm/px · z∈[+938,+1086]mm · 9 of 86 slices shown, 12 images]
[im 6/86  brain]
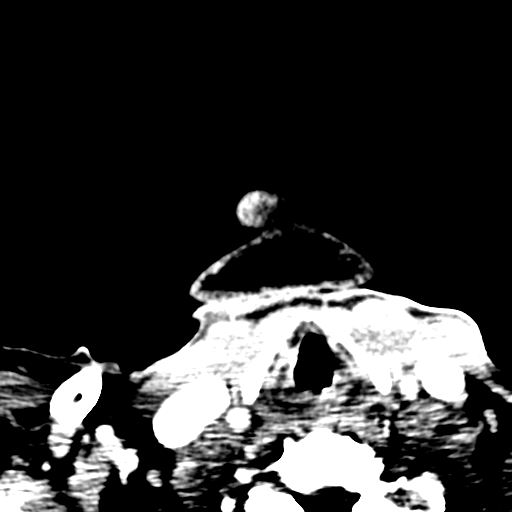
[im 6/86  bone]
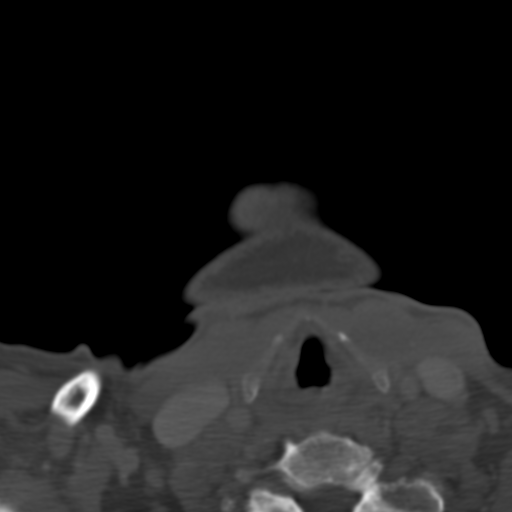
[im 15/86  bone]
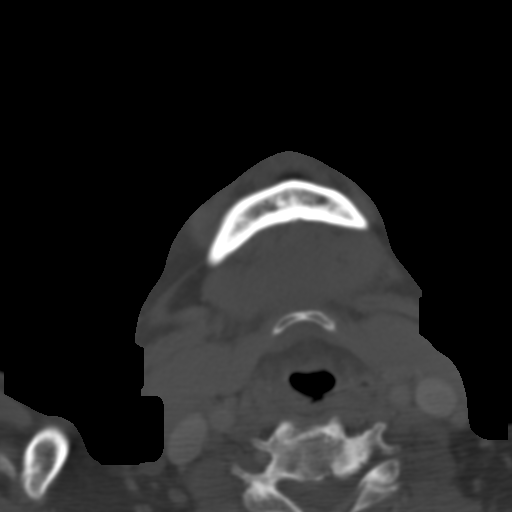
[im 24/86  bone]
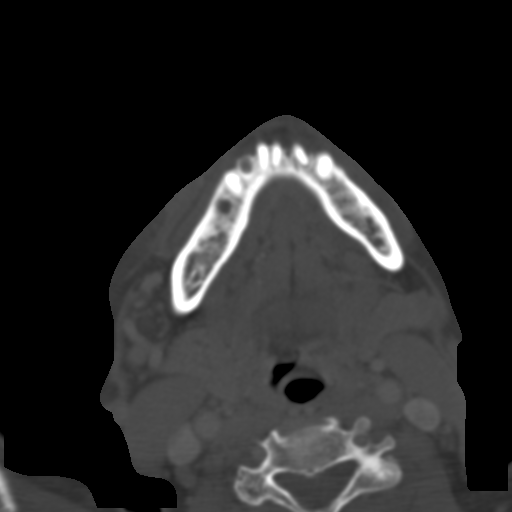
[im 33/86  bone]
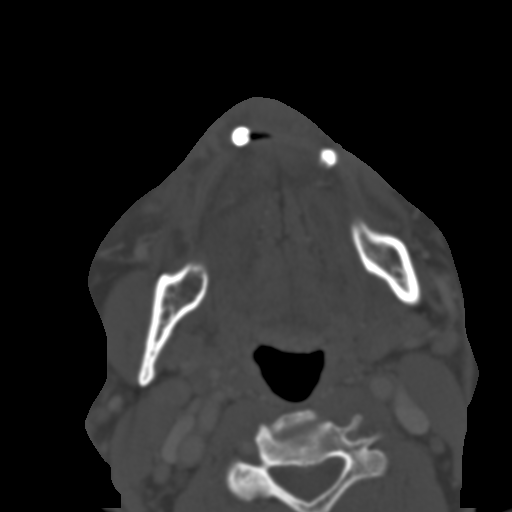
[im 44/86  brain]
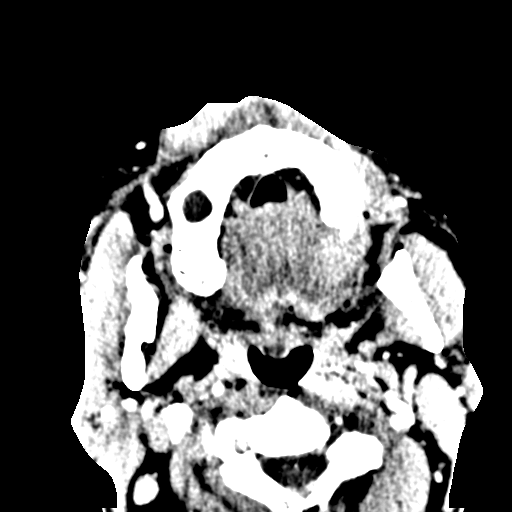
[im 44/86  bone]
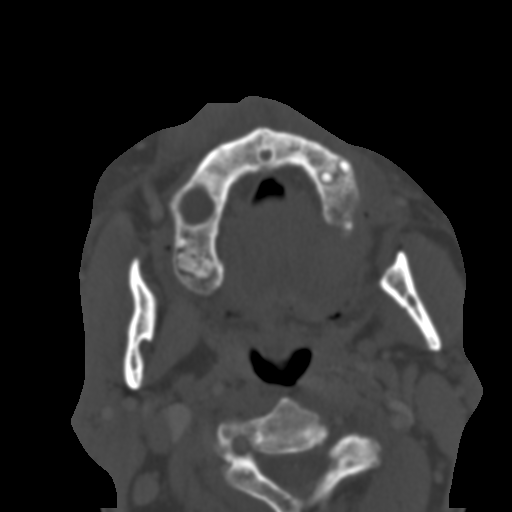
[im 53/86  bone]
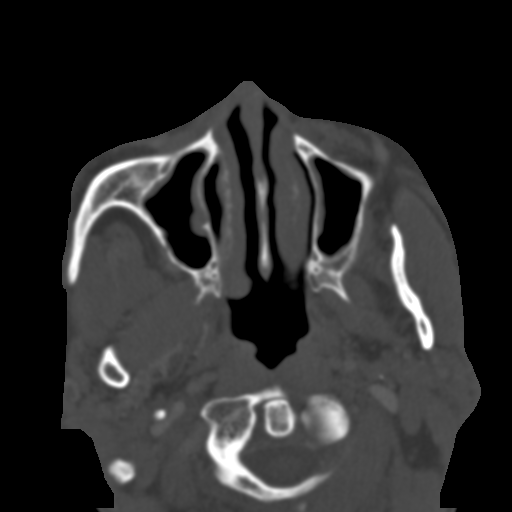
[im 62/86  bone]
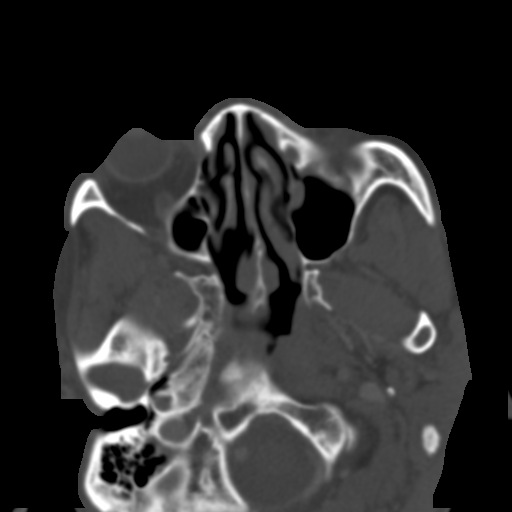
[im 71/86  bone]
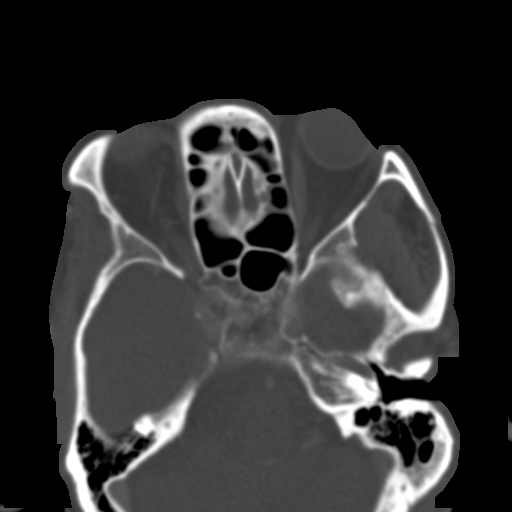
[im 80/86  brain]
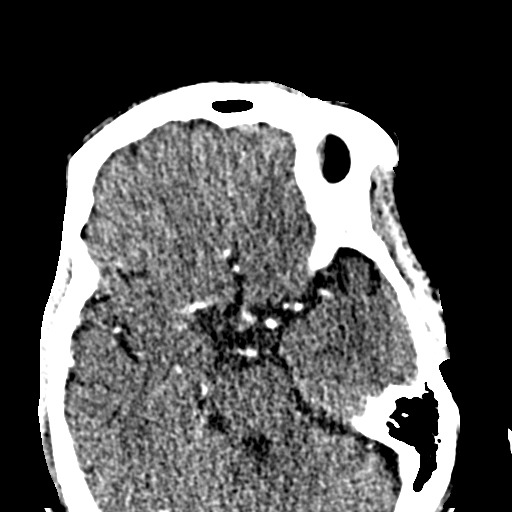
[im 80/86  bone]
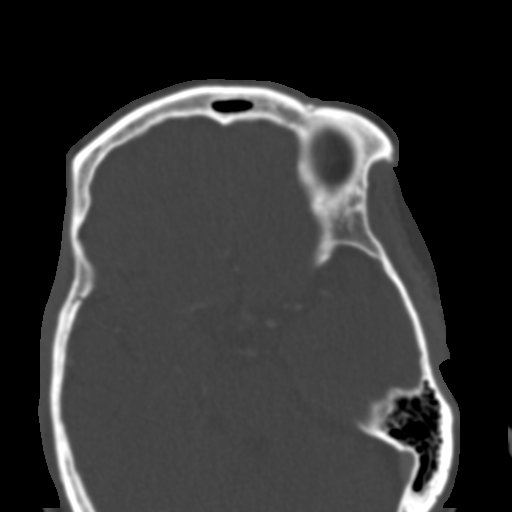

[Series 6: facialbone 2.0 cor st · coronal · 0.36mm/px · 3 of 83 slices shown]
[im 28/83  bone]
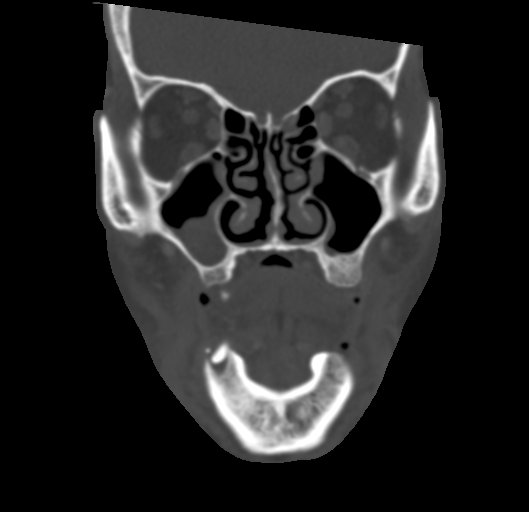
[im 37/83  bone]
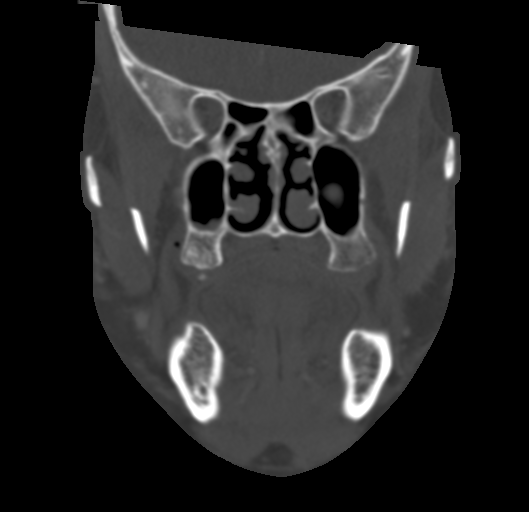
[im 46/83  bone]
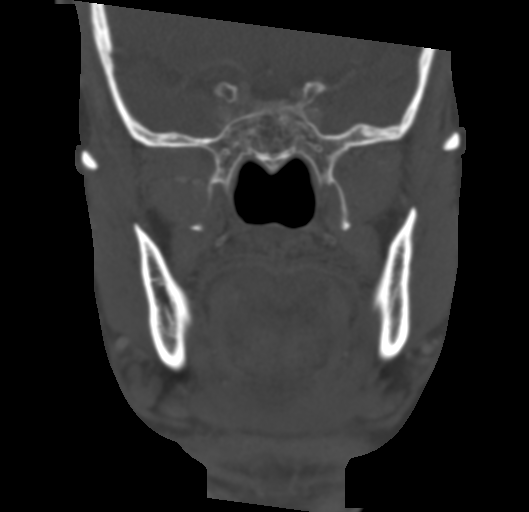

[Series 7: facialbone 2.0 sag st · sagittal · 0.36mm/px · 3 of 83 slices shown]
[im 29/83  bone]
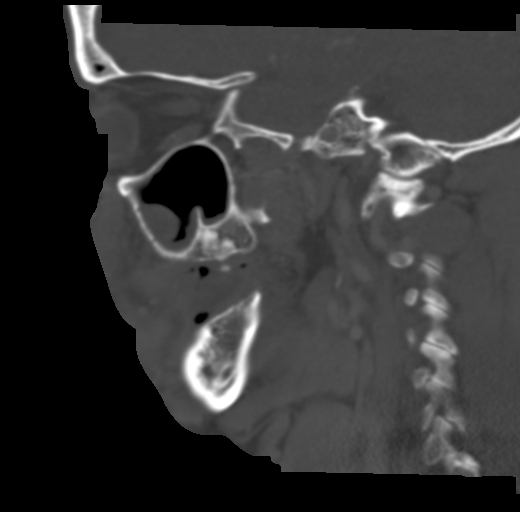
[im 42/83  bone]
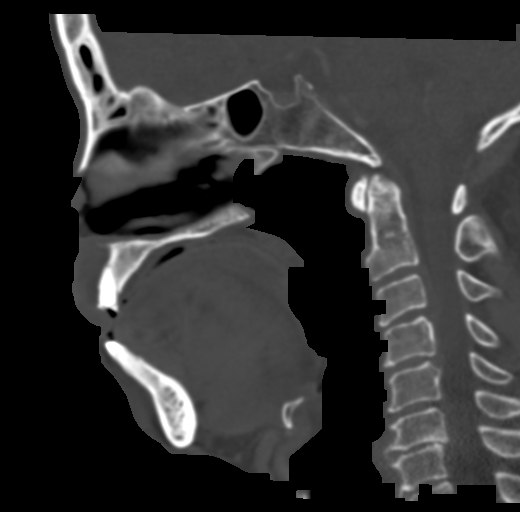
[im 55/83  bone]
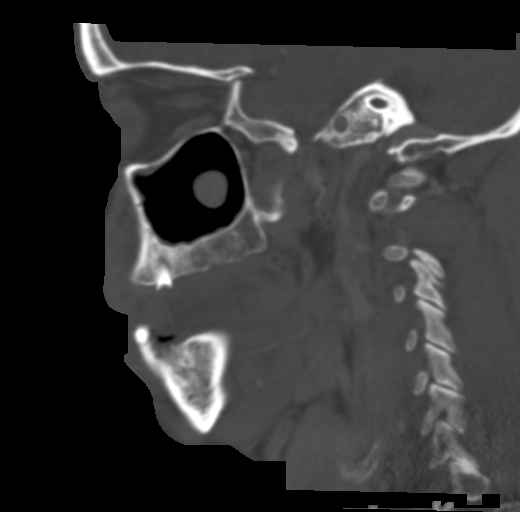

[15 of 47 positions shown; findings below may reference images not displayed]

FINDINGS: Prominent soft tissue swelling is noted overlying the right mandible
and overlying the central maxilla, with a 1.9 x 1.6 x 0.8 cm soft
tissue abscess overlying the right central maxilla.

There is no evidence of fracture or dislocation. The maxilla and
mandible appear intact. The nasal bone is unremarkable in
appearance.

Numerous large maxillary and mandibular dental caries are noted.
Dental caries involve all of the visualized maxillary teeth. A
periapical abscess is noted at the root of the right second
mandibular premolar, and lucency is noted about the root of the left
lateral mandibular incisor. A periapical abscess is also suggested
at the root of the left central maxillary incisor, with cortical
breakthrough, and mild loosening is noted about the roots of the
remaining bilateral maxillary premolars.

The orbits are intact bilaterally. Mucosal thickening is noted at
the right maxillary sinus, and a small mucus retention cyst or polyp
is noted at the left maxillary sinus. The remaining visualized
paranasal sinuses and mastoid air cells are well-aerated.

Soft tissue swelling is noted inferior to the right orbit. The
parapharyngeal fat planes are preserved. The nasopharynx, oropharynx
and hypopharynx are unremarkable in appearance. The visualized
portions of the valleculae and piriform sinuses are grossly
unremarkable.

Minimal calcification is seen at the carotid bifurcations
bilaterally.

The parotid and submandibular glands are within normal limits. No
cervical lymphadenopathy is seen. The visualized portions of the
brain are unremarkable, aside from mild cerebellar atrophy.
IMPRESSION: 1. 1.9 x 1.6 x 0.8 cm soft tissue abscess overlying the right
central maxilla.
2. Prominent soft tissue swelling overlying the central maxilla and
right mandible, and inferior to the right orbit.
3. Periapical abscess at the root of the left central maxillary
incisor, with cortical breakthrough. This may correspond to the soft
tissue abscess.
4. Periapical abscess at the root of the right second mandibular
premolar, and lucency about the root of the left lateral mandibular
incisor.
5. Numerous large maxillary and mandibular dental caries noted;
dental caries involve all of the visualized maxillary teeth. Mild
loosening noted about the roots of the remaining bilateral maxillary
premolars.
6. Mucosal thickening at the right maxillary sinus, and small mucus
retention cyst or polyp at the left maxillary sinus.
7. Minimal calcification at the carotid bifurcations bilaterally.
8. Mild cerebellar atrophy noted.

## 2017-04-08 ENCOUNTER — Telehealth: Payer: Self-pay | Admitting: Cardiology

## 2017-04-08 ENCOUNTER — Ambulatory Visit (INDEPENDENT_AMBULATORY_CARE_PROVIDER_SITE_OTHER): Payer: Medicare Other | Admitting: *Deleted

## 2017-04-08 DIAGNOSIS — I472 Ventricular tachycardia: Secondary | ICD-10-CM

## 2017-04-08 DIAGNOSIS — I4729 Other ventricular tachycardia: Secondary | ICD-10-CM

## 2017-04-08 DIAGNOSIS — I255 Ischemic cardiomyopathy: Secondary | ICD-10-CM

## 2017-04-08 NOTE — Telephone Encounter (Signed)
LMOVM reminding pt to send remote transmission.   

## 2017-04-09 NOTE — Progress Notes (Signed)
Remote ICD transmission.   

## 2017-04-10 ENCOUNTER — Telehealth: Payer: Self-pay | Admitting: Internal Medicine

## 2017-04-10 ENCOUNTER — Encounter: Payer: Self-pay | Admitting: Internal Medicine

## 2017-04-10 NOTE — Telephone Encounter (Signed)
Pt returned your call please give him a call back.

## 2017-04-10 NOTE — Telephone Encounter (Signed)
Attempted to return call to patient. Patient did not answer. Left message for patient to call back.

## 2017-04-10 NOTE — Telephone Encounter (Signed)
Left message for patient to call back  

## 2017-04-10 NOTE — Telephone Encounter (Signed)
Pt called to rs appt from today, he is asking for results, I asked his 3 times what results, but I could not understand him-pls call [phone]

## 2017-04-11 LAB — CUP PACEART REMOTE DEVICE CHECK
Brady Statistic AP VP Percent: 57.38 %
Brady Statistic AP VS Percent: 0.84 %
Brady Statistic AS VP Percent: 40.08 %
Brady Statistic AS VS Percent: 1.7 %
Brady Statistic RV Percent Paced: 95.16 %
Date Time Interrogation Session: 20180730191357
HIGH POWER IMPEDANCE MEASURED VALUE: 67 Ohm
Implantable Lead Implant Date: 20151203
Implantable Lead Location: 753859
Implantable Lead Location: 753860
Implantable Lead Model: 4598
Implantable Lead Model: 5076
Lead Channel Impedance Value: 304 Ohm
Lead Channel Impedance Value: 323 Ohm
Lead Channel Impedance Value: 323 Ohm
Lead Channel Impedance Value: 323 Ohm
Lead Channel Impedance Value: 342 Ohm
Lead Channel Impedance Value: 532 Ohm
Lead Channel Impedance Value: 532 Ohm
Lead Channel Impedance Value: 570 Ohm
Lead Channel Pacing Threshold Amplitude: 0.5 V
Lead Channel Pacing Threshold Amplitude: 1.125 V
Lead Channel Pacing Threshold Amplitude: 1.375 V
Lead Channel Pacing Threshold Pulse Width: 0.4 ms
Lead Channel Pacing Threshold Pulse Width: 0.4 ms
Lead Channel Pacing Threshold Pulse Width: 0.4 ms
Lead Channel Sensing Intrinsic Amplitude: 0.625 mV
Lead Channel Setting Pacing Amplitude: 1.5 V
Lead Channel Setting Pacing Amplitude: 2.25 V
Lead Channel Setting Pacing Amplitude: 2.75 V
Lead Channel Setting Pacing Pulse Width: 0.4 ms
Lead Channel Setting Sensing Sensitivity: 0.3 mV
MDC IDC LEAD IMPLANT DT: 20151203
MDC IDC LEAD IMPLANT DT: 20151203
MDC IDC LEAD LOCATION: 753858
MDC IDC MSMT BATTERY REMAINING LONGEVITY: 45 mo
MDC IDC MSMT BATTERY VOLTAGE: 2.97 V
MDC IDC MSMT LEADCHNL LV IMPEDANCE VALUE: 323 Ohm
MDC IDC MSMT LEADCHNL LV IMPEDANCE VALUE: 380 Ohm
MDC IDC MSMT LEADCHNL LV IMPEDANCE VALUE: 532 Ohm
MDC IDC MSMT LEADCHNL LV IMPEDANCE VALUE: 570 Ohm
MDC IDC MSMT LEADCHNL RA SENSING INTR AMPL: 0.625 mV
MDC IDC MSMT LEADCHNL RV IMPEDANCE VALUE: 342 Ohm
MDC IDC MSMT LEADCHNL RV SENSING INTR AMPL: 23.5 mV
MDC IDC MSMT LEADCHNL RV SENSING INTR AMPL: 23.5 mV
MDC IDC PG IMPLANT DT: 20151203
MDC IDC SET LEADCHNL RV PACING PULSEWIDTH: 0.4 ms
MDC IDC STAT BRADY RA PERCENT PACED: 56.6 %

## 2017-04-12 ENCOUNTER — Encounter: Payer: Self-pay | Admitting: Cardiology

## 2017-04-15 NOTE — Telephone Encounter (Addendum)
Pt has upcoming appt and also wanted device results which are normal   Pt  Aware of findings ./cy  Lm to call back ./cy

## 2017-05-06 ENCOUNTER — Encounter: Payer: Self-pay | Admitting: Cardiology

## 2017-05-07 ENCOUNTER — Ambulatory Visit: Payer: Self-pay | Admitting: Pulmonary Disease

## 2017-05-29 ENCOUNTER — Other Ambulatory Visit: Payer: Self-pay | Admitting: Cardiology

## 2017-06-05 ENCOUNTER — Encounter: Payer: Self-pay | Admitting: Internal Medicine

## 2017-06-10 ENCOUNTER — Encounter: Payer: Self-pay | Admitting: Internal Medicine

## 2017-06-10 ENCOUNTER — Ambulatory Visit (INDEPENDENT_AMBULATORY_CARE_PROVIDER_SITE_OTHER): Payer: Self-pay | Admitting: Internal Medicine

## 2017-06-10 VITALS — BP 148/72 | HR 83 | Ht 68.0 in | Wt 151.0 lb

## 2017-06-10 DIAGNOSIS — I255 Ischemic cardiomyopathy: Secondary | ICD-10-CM

## 2017-06-10 DIAGNOSIS — I4729 Other ventricular tachycardia: Secondary | ICD-10-CM

## 2017-06-10 DIAGNOSIS — I5042 Chronic combined systolic (congestive) and diastolic (congestive) heart failure: Secondary | ICD-10-CM

## 2017-06-10 DIAGNOSIS — I472 Ventricular tachycardia: Secondary | ICD-10-CM

## 2017-06-10 NOTE — Progress Notes (Signed)
PCP: Patient, No Pcp Per Primary Cardiologist:  Dr Swaziland Primary EP: Dr Johney Frame  [name] is a 76 y.o. male who presents today for routine electrophysiology followup.  Since last being seen in our clinic, the patient reports doing very well.  He snores and does feel tired at times.  He has not had a sleep study.  Chronic SOB has improved.  Today, he denies symptoms of palpitations, chest pain, lower extremity edema, dizziness, presyncope, syncope, or ICD shocks.  The patient is otherwise without complaint today.   Past Medical History:  Diagnosis Date  . CAD (coronary artery disease)    a. s/p stent RCA DES 9/09; b. 2014 Attempted PCI of OM1 @ [place];  c. 04/2014 Cath: LAD 40-50p, D1 95-99 (chronic), LCX 30-40 inf branch, OM1 CTO, RCA 30-40p, RCA patent stent, EF 35%->Med Rx.  . Chronic combined systolic and diastolic CHF (congestive heart failure) (HCC)    a. EF about 40 to 45% per echo in April 2013;  b. 04/2014 Echo: EF 20-25%, sev LVH, sev glob HK, Gr 1 DD, mildly reduced RV fxn, PASP . c. 01/2017: EF improved to 50-55%.   . Chronic low back pain   . COPD (chronic obstructive pulmonary disease) (HCC) dx 06/2013   PFTs 07/08/13: mod obst with resp to bronchodilator, moderate decrease diffusion, airtrapping  . Dyslipidemia    a. on statin  . HLD (hyperlipidemia)   . HTN (hypertension)    a. Reports intolerance to hydralazine; b. no beta blockers 2/2 bradycardia;  c. failed on ACE and ARB.  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 LBBB (left bundle branch block)   . LVH (left ventricular hypertrophy)   . Mixed Ischemic/Non-ischemic Cardiomyopathy    a. 04/2014 Echo: EF 20-25%, sev glob HK.  [name] Noncompliance   . NSVT (nonsustained ventricular tachycardia) (HCC)   . Paroxysmal atrial fibrillation (HCC)    a. identified on device interrogation 01/2016   Past Surgical History:  Procedure Laterality Date  . BI-VENTRICULAR IMPLANTABLE CARDIOVERTER DEFIBRILLATOR N/A  08/12/2014   MDT Ovidio Kin XT CRTD implanted by Dr Johney Frame  . CARDIAC CATHETERIZATION     ejection fraction 50%  . LEFT HEART CATHETERIZATION WITH CORONARY ANGIOGRAM N/A 05/08/2014   Procedure: LEFT HEART CATHETERIZATION WITH CORONARY ANGIOGRAM;  Surgeon: Micheline Chapman, MD;  Location: [facility] CATH LAB;  Service: Cardiovascular;  Laterality: N/A;    ROS- all systems are reviewed and negative except as per HPI above  Current Outpatient Prescriptions  Medication Sig Dispense Refill  . albuterol (PROVENTIL HFA;VENTOLIN HFA) 108 (90 Base) MCG/ACT inhaler Inhale 1 puff into the lungs every 6 (six) hours as needed for wheezing or shortness of breath. 1 Inhaler 5  . albuterol (PROVENTIL) (2.5 MG/3ML) 0.083% nebulizer solution Take 3 mLs (2.5 mg total) by nebulization every 6 (six) hours as needed for wheezing or shortness of breath. 360 mL 11  . amLODipine (NORVASC) 5 MG tablet Take 5 mg by mouth 2 (two) times daily.    [name] atorvastatin (LIPITOR) 20 MG tablet TAKE 1 TABLET EVERY DAY 30 tablet 2  . clopidogrel (PLAVIX) 75 MG tablet TAKE 1 TABLET BY MOUTH EVERY DAY 30 tablet 6  . isosorbide mononitrate (IMDUR) 60 MG 24 hr tablet TAKE 1 TABLET (60 MG TOTAL) BY MOUTH DAILY. 90 tablet 2  . KLOR-CON M20 20 MEQ tablet TAKE 2 TABLETS (40 MEQ TOTAL) BY MOUTH DAILY. 60 tablet 10  . losartan (COZAAR) 100 MG tablet Take 1 tablet (  100 mg total) by mouth daily. 90 tablet 3  . metoprolol tartrate (LOPRESSOR) 50 MG tablet Take 50 mg by mouth 2 (two) times daily.    . nitroGLYCERIN (NITROSTAT) 0.4 MG SL tablet Place 1 tablet (0.4 mg total) under the tongue every 5 (five) minutes as needed for chest pain. 25 tablet 3  . SYMBICORT 160-4.5 MCG/ACT inhaler INHALE 2 PUFFS INTO THE LUNGS 2 (TWO) TIMES DAILY. 10.2 g 5   No current facility-administered medications for this visit.     Physical Exam: Vitals:   06/10/17 1608  BP: (!) 148/72  Pulse: 83  SpO2: 98%  Weight: 151 lb (68.5 kg)  Height:  (1.727 m)    GEN-  The patient is well appearing, alert and oriented x 3 today.   Head- normocephalic, atraumatic Eyes-  Sclera clear, conjunctiva pink Ears- hearing intact Oropharynx- clear Lungs- Clear to ausculation bilaterally, normal work of breathing Chest- ICD pocket is well healed Heart- Regular rate and rhythm, no murmurs, rubs or gallops, PMI not laterally displaced GI- soft, NT, ND, + BS Extremities- + clubbing, no cyanosis, or edema  ICD interrogation- reviewed in detail today,  See PACEART report  ekg tracing ordered today is personally reviewed and shows BiV paced rhythm 83 bpm  Assessment and Plan:  1.  Chronic systolic dysfunction/ ischemic CM/ CAD euvolemic today EF has improved to 45-50% with CRT (echo 02/20/16 reviewed with patient today) Stable on an appropriate medical regimen Normal ICD function See [name] report No changes today Enroll in [facility] device clinic with [name]  2. HTN Stable No change required today  3. COPD Chronically SOB Followed by Pulmonary  4. Paroxysmal atrial fibrillation/ atrial tach Diagnosed on visit last year. chads2vasc score is 5, however afib burden is very low.  On review, he has only had far field sensing since his last visit  5. Nonsustained VT Asymptomatic Will follow closely  carelink Return to see EP NP every 1 year Follow-up with Dr Swaziland as scheduled  Hillis Range MD, [facility] 06/10/2017 4:21 PM

## 2017-06-10 NOTE — Patient Instructions (Addendum)
Medication Instructions:  Your physician recommends that you continue on your current medications as directed. Please refer to the Current Medication list given to you today.   Labwork: None ordered   Testing/Procedures: None ordered   Follow-Up:  Your physician recommends that you schedule a follow-up appointment with Dr Elba Barman a sleep study   Your physician wants you to follow-up in: 12 months with Gypsy Balsam, NP You will receive a reminder letter in the mail two months in advance. If you don't receive a letter, please call our office to schedule the follow-up appointment.  Remote monitoring is used to monitor your ICD from home. This monitoring reduces the number of office visits required to check your device to one time per year. It allows [place] to keep an eye on the functioning of your device to ensure it is working properly. You are scheduled for a device check from home on 07/08/17. You may send your transmission at any time that day. If you have a wireless device, the transmission will be sent automatically. After your physician reviews your transmission, you will receive a postcard with your next transmission date.    [name], RN will call you to enroll to [facility] clinic

## 2017-06-11 LAB — CUP PACEART INCLINIC DEVICE CHECK
Battery Remaining Longevity: 42 mo
Brady Statistic AP VS Percent: 0.72 %
Brady Statistic AS VS Percent: 1.76 %
HighPow Impedance: 70 Ohm
Implantable Lead Implant Date: 20151203
Implantable Lead Location: 753859
Implantable Lead Location: 753860
Implantable Lead Model: 5076
Implantable Lead Model: 6935
Implantable Pulse Generator Implant Date: 20151203
Lead Channel Impedance Value: 323 Ohm
Lead Channel Impedance Value: 380 Ohm
Lead Channel Impedance Value: 380 Ohm
Lead Channel Impedance Value: 532 Ohm
Lead Channel Impedance Value: 532 Ohm
Lead Channel Impedance Value: 570 Ohm
Lead Channel Pacing Threshold Amplitude: 0.5 V
Lead Channel Pacing Threshold Amplitude: 1.25 V
Lead Channel Sensing Intrinsic Amplitude: 1.375 mV
Lead Channel Sensing Intrinsic Amplitude: 1.625 mV
Lead Channel Sensing Intrinsic Amplitude: 30 mV
Lead Channel Setting Pacing Amplitude: 2.25 V
Lead Channel Setting Sensing Sensitivity: 0.3 mV
MDC IDC LEAD IMPLANT DT: 20151203
MDC IDC LEAD IMPLANT DT: 20151203
MDC IDC LEAD LOCATION: 753858
MDC IDC MSMT BATTERY VOLTAGE: 2.96 V
MDC IDC MSMT LEADCHNL LV IMPEDANCE VALUE: 304 Ohm
MDC IDC MSMT LEADCHNL LV IMPEDANCE VALUE: 323 Ohm
MDC IDC MSMT LEADCHNL LV IMPEDANCE VALUE: 323 Ohm
MDC IDC MSMT LEADCHNL LV IMPEDANCE VALUE: 532 Ohm
MDC IDC MSMT LEADCHNL LV IMPEDANCE VALUE: 532 Ohm
MDC IDC MSMT LEADCHNL LV PACING THRESHOLD PULSEWIDTH: 0.4 ms
MDC IDC MSMT LEADCHNL RA IMPEDANCE VALUE: 342 Ohm
MDC IDC MSMT LEADCHNL RA PACING THRESHOLD PULSEWIDTH: 0.4 ms
MDC IDC MSMT LEADCHNL RV IMPEDANCE VALUE: 323 Ohm
MDC IDC MSMT LEADCHNL RV PACING THRESHOLD AMPLITUDE: 1.375 V
MDC IDC MSMT LEADCHNL RV PACING THRESHOLD PULSEWIDTH: 0.4 ms
MDC IDC MSMT LEADCHNL RV SENSING INTR AMPL: 26.5 mV
MDC IDC SESS DTM: 20181001202543
MDC IDC SET LEADCHNL LV PACING PULSEWIDTH: 0.4 ms
MDC IDC SET LEADCHNL RA PACING AMPLITUDE: 1.5 V
MDC IDC SET LEADCHNL RV PACING AMPLITUDE: 2.75 V
MDC IDC SET LEADCHNL RV PACING PULSEWIDTH: 0.4 ms
MDC IDC STAT BRADY AP VP PERCENT: 53.34 %
MDC IDC STAT BRADY AS VP PERCENT: 44.17 %
MDC IDC STAT BRADY RA PERCENT PACED: 52.67 %
MDC IDC STAT BRADY RV PERCENT PACED: 95.38 %

## 2017-06-26 ENCOUNTER — Other Ambulatory Visit: Payer: Self-pay | Admitting: Cardiology

## 2017-07-08 ENCOUNTER — Ambulatory Visit (INDEPENDENT_AMBULATORY_CARE_PROVIDER_SITE_OTHER): Payer: Self-pay | Admitting: *Deleted

## 2017-07-08 DIAGNOSIS — I255 Ischemic cardiomyopathy: Secondary | ICD-10-CM

## 2017-07-09 ENCOUNTER — Telehealth: Payer: Self-pay

## 2017-07-09 LAB — CUP PACEART REMOTE DEVICE CHECK
Battery Voltage: 2.96 V
Brady Statistic AP VP Percent: 42.63 %
Brady Statistic AS VP Percent: 55.34 %
Brady Statistic RV Percent Paced: 94.67 %
Date Time Interrogation Session: 20181029083824
HIGH POWER IMPEDANCE MEASURED VALUE: 64 Ohm
Implantable Lead Implant Date: 20151203
Implantable Lead Location: 753859
Implantable Lead Model: 4598
Lead Channel Impedance Value: 266 Ohm
Lead Channel Impedance Value: 323 Ohm
Lead Channel Impedance Value: 532 Ohm
Lead Channel Impedance Value: 532 Ohm
Lead Channel Impedance Value: 532 Ohm
Lead Channel Pacing Threshold Amplitude: 0.5 V
Lead Channel Pacing Threshold Amplitude: 1.125 V
Lead Channel Pacing Threshold Pulse Width: 0.4 ms
Lead Channel Pacing Threshold Pulse Width: 0.4 ms
Lead Channel Setting Pacing Amplitude: 1.5 V
Lead Channel Setting Pacing Amplitude: 2.75 V
Lead Channel Setting Pacing Pulse Width: 0.4 ms
Lead Channel Setting Sensing Sensitivity: 0.3 mV
MDC IDC LEAD IMPLANT DT: 20151203
MDC IDC LEAD IMPLANT DT: 20151203
MDC IDC LEAD LOCATION: 753858
MDC IDC LEAD LOCATION: 753860
MDC IDC MSMT BATTERY REMAINING LONGEVITY: 40 mo
MDC IDC MSMT LEADCHNL LV IMPEDANCE VALUE: 323 Ohm
MDC IDC MSMT LEADCHNL LV IMPEDANCE VALUE: 323 Ohm
MDC IDC MSMT LEADCHNL LV IMPEDANCE VALUE: 342 Ohm
MDC IDC MSMT LEADCHNL LV IMPEDANCE VALUE: 342 Ohm
MDC IDC MSMT LEADCHNL LV IMPEDANCE VALUE: 513 Ohm
MDC IDC MSMT LEADCHNL LV IMPEDANCE VALUE: 532 Ohm
MDC IDC MSMT LEADCHNL RA IMPEDANCE VALUE: 342 Ohm
MDC IDC MSMT LEADCHNL RA SENSING INTR AMPL: 0.625 mV
MDC IDC MSMT LEADCHNL RA SENSING INTR AMPL: 0.625 mV
MDC IDC MSMT LEADCHNL RV IMPEDANCE VALUE: 342 Ohm
MDC IDC MSMT LEADCHNL RV PACING THRESHOLD AMPLITUDE: 1.375 V
MDC IDC MSMT LEADCHNL RV PACING THRESHOLD PULSEWIDTH: 0.4 ms
MDC IDC MSMT LEADCHNL RV SENSING INTR AMPL: 22.5 mV
MDC IDC MSMT LEADCHNL RV SENSING INTR AMPL: 22.5 mV
MDC IDC PG IMPLANT DT: 20151203
MDC IDC SET LEADCHNL LV PACING AMPLITUDE: 2.25 V
MDC IDC SET LEADCHNL LV PACING PULSEWIDTH: 0.4 ms
MDC IDC STAT BRADY AP VS PERCENT: 0.63 %
MDC IDC STAT BRADY AS VS PERCENT: 1.4 %
MDC IDC STAT BRADY RA PERCENT PACED: 42.13 %

## 2017-07-09 NOTE — Telephone Encounter (Signed)
Patient referred to [facility] by Dr Johney Frame at last office visit.  Attempted ICM intro call to patient and left detailed message, per DPR, reason for call.  Provided ICM direct number and will attempt another call at a later date.  1st Remote Transmission scheduled for 08/09/2017 if patient is agreeable.

## 2017-07-09 NOTE — Progress Notes (Signed)
Remote ICD transmission.   

## 2017-07-16 ENCOUNTER — Encounter: Payer: Self-pay | Admitting: Cardiology

## 2017-08-10 ENCOUNTER — Other Ambulatory Visit: Payer: Self-pay | Admitting: Pulmonary Disease

## 2017-10-07 ENCOUNTER — Ambulatory Visit (INDEPENDENT_AMBULATORY_CARE_PROVIDER_SITE_OTHER): Payer: Self-pay | Admitting: *Deleted

## 2017-10-07 DIAGNOSIS — I255 Ischemic cardiomyopathy: Secondary | ICD-10-CM

## 2017-10-07 NOTE — Progress Notes (Signed)
Remote ICD transmission.   

## 2017-10-09 LAB — CUP PACEART REMOTE DEVICE CHECK
Brady Statistic AP VP Percent: 41.95 %
Brady Statistic AP VS Percent: 0.65 %
Brady Statistic AS VP Percent: 56.22 %
Brady Statistic AS VS Percent: 1.18 %
Date Time Interrogation Session: 20190128123824
HighPow Impedance: 71 Ohm
Implantable Lead Implant Date: 20151203
Implantable Lead Implant Date: 20151203
Implantable Lead Location: 753859
Implantable Lead Location: 753860
Implantable Lead Model: 5076
Implantable Pulse Generator Implant Date: 20151203
Lead Channel Impedance Value: 304 Ohm
Lead Channel Impedance Value: 323 Ohm
Lead Channel Impedance Value: 323 Ohm
Lead Channel Impedance Value: 342 Ohm
Lead Channel Impedance Value: 342 Ohm
Lead Channel Impedance Value: 532 Ohm
Lead Channel Impedance Value: 532 Ohm
Lead Channel Impedance Value: 570 Ohm
Lead Channel Pacing Threshold Amplitude: 0.5 V
Lead Channel Pacing Threshold Amplitude: 1 V
Lead Channel Pacing Threshold Amplitude: 1.125 V
Lead Channel Pacing Threshold Pulse Width: 0.4 ms
Lead Channel Pacing Threshold Pulse Width: 0.4 ms
Lead Channel Pacing Threshold Pulse Width: 0.4 ms
Lead Channel Sensing Intrinsic Amplitude: 1.125 mV
Lead Channel Sensing Intrinsic Amplitude: 1.125 mV
Lead Channel Setting Pacing Amplitude: 1.5 V
Lead Channel Setting Pacing Amplitude: 2.25 V
Lead Channel Setting Sensing Sensitivity: 0.3 mV
MDC IDC LEAD IMPLANT DT: 20151203
MDC IDC LEAD LOCATION: 753858
MDC IDC MSMT BATTERY REMAINING LONGEVITY: 42 mo
MDC IDC MSMT BATTERY VOLTAGE: 2.96 V
MDC IDC MSMT LEADCHNL LV IMPEDANCE VALUE: 380 Ohm
MDC IDC MSMT LEADCHNL LV IMPEDANCE VALUE: 532 Ohm
MDC IDC MSMT LEADCHNL LV IMPEDANCE VALUE: 570 Ohm
MDC IDC MSMT LEADCHNL RV IMPEDANCE VALUE: 266 Ohm
MDC IDC MSMT LEADCHNL RV IMPEDANCE VALUE: 323 Ohm
MDC IDC MSMT LEADCHNL RV SENSING INTR AMPL: 24.75 mV
MDC IDC MSMT LEADCHNL RV SENSING INTR AMPL: 24.75 mV
MDC IDC SET LEADCHNL LV PACING AMPLITUDE: 2 V
MDC IDC SET LEADCHNL LV PACING PULSEWIDTH: 0.4 ms
MDC IDC SET LEADCHNL RV PACING PULSEWIDTH: 0.4 ms
MDC IDC STAT BRADY RA PERCENT PACED: 42.06 %
MDC IDC STAT BRADY RV PERCENT PACED: 96.75 %

## 2017-10-10 ENCOUNTER — Encounter: Payer: Self-pay | Admitting: Cardiology

## 2017-10-10 ENCOUNTER — Telehealth: Payer: Self-pay

## 2017-10-10 NOTE — Telephone Encounter (Signed)
Patient referred to [facility] by Dr Johney Frame at last office visit.  2nd attempted ICM intro call to patient and left detailed message, per DPR, reason for call.  Provided ICM direct number for return call.

## 2017-10-10 NOTE — Telephone Encounter (Signed)
Follow up     Patient returning call from [place]. Please call

## 2017-10-14 NOTE — Telephone Encounter (Signed)
Late entry for 10-10-2017.  Attempted return call to patient and no answer.  Unable to contact patient for ICM enrollment.

## 2017-11-29 ENCOUNTER — Other Ambulatory Visit: Payer: Self-pay | Admitting: Cardiology

## 2017-11-29 NOTE — Telephone Encounter (Signed)
Rx has been sent to the pharmacy electronically. ° °

## 2017-12-13 ENCOUNTER — Other Ambulatory Visit: Payer: Self-pay

## 2017-12-13 ENCOUNTER — Emergency Department (HOSPITAL_COMMUNITY)
Admission: EM | Admit: 2017-12-13 | Discharge: 2017-12-14 | Disposition: A | Discharge status: Home / Self Care | Payer: Medicare Other | Attending: Emergency Medicine | Admitting: Emergency Medicine

## 2017-12-13 ENCOUNTER — Emergency Department (HOSPITAL_COMMUNITY): Payer: Self-pay

## 2017-12-13 ENCOUNTER — Encounter (HOSPITAL_COMMUNITY): Payer: Self-pay

## 2017-12-13 DIAGNOSIS — Z79899 Other long term (current) drug therapy: Secondary | ICD-10-CM | POA: Insufficient documentation

## 2017-12-13 DIAGNOSIS — Y999 Unspecified external cause status: Secondary | ICD-10-CM | POA: Insufficient documentation

## 2017-12-13 DIAGNOSIS — Y929 Unspecified place or not applicable: Secondary | ICD-10-CM | POA: Insufficient documentation

## 2017-12-13 DIAGNOSIS — S80811A Abrasion, right lower leg, initial encounter: Secondary | ICD-10-CM | POA: Insufficient documentation

## 2017-12-13 DIAGNOSIS — J449 Chronic obstructive pulmonary disease, unspecified: Secondary | ICD-10-CM | POA: Insufficient documentation

## 2017-12-13 DIAGNOSIS — I10 Essential (primary) hypertension: Secondary | ICD-10-CM | POA: Insufficient documentation

## 2017-12-13 DIAGNOSIS — Z87891 Personal history of nicotine dependence: Secondary | ICD-10-CM | POA: Insufficient documentation

## 2017-12-13 DIAGNOSIS — I5043 Acute on chronic combined systolic (congestive) and diastolic (congestive) heart failure: Secondary | ICD-10-CM | POA: Insufficient documentation

## 2017-12-13 DIAGNOSIS — M79671 Pain in right foot: Secondary | ICD-10-CM

## 2017-12-13 DIAGNOSIS — Z23 Encounter for immunization: Secondary | ICD-10-CM | POA: Insufficient documentation

## 2017-12-13 DIAGNOSIS — Y939 Activity, unspecified: Secondary | ICD-10-CM | POA: Insufficient documentation

## 2017-12-13 DIAGNOSIS — W208XXA Other cause of strike by thrown, projected or falling object, initial encounter: Secondary | ICD-10-CM | POA: Insufficient documentation

## 2017-12-13 DIAGNOSIS — I2510 Atherosclerotic heart disease of native coronary artery without angina pectoris: Secondary | ICD-10-CM | POA: Insufficient documentation

## 2017-12-13 MED ORDER — TETANUS-DIPHTH-ACELL PERTUSSIS 5-2.5-18.5 LF-MCG/0.5 IM SUSP
0.5000 mL | Freq: Once | INTRAMUSCULAR | Status: AC
  Administered 2017-12-13: 0.5 mL via INTRAMUSCULAR
  Filled 2017-12-13: qty 0.5

## 2017-12-13 MED ORDER — HYDROCODONE-ACETAMINOPHEN 5-325 MG PO TABS
1.0000 | ORAL_TABLET | Freq: Once | ORAL | Status: AC
  Administered 2017-12-13: 1 via ORAL
  Filled 2017-12-13: qty 1

## 2017-12-13 NOTE — ED Notes (Signed)
Bed: WA17 Expected date:  Expected time:  Means of arrival:  Comments: EMS 

## 2017-12-13 NOTE — ED Notes (Signed)
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

## 2017-12-13 NOTE — ED Triage Notes (Signed)
Patient states he walked to the store and developed severe right foot pain. Took 2 tylenol arthritis strength with no resolution or decrease in pain.

## 2017-12-14 MED ORDER — TRAMADOL HCL 50 MG PO TABS: 50.0000 mg | ORAL_TABLET | Freq: Four times a day (QID) | ORAL | 0 refills | Status: DC | PRN

## 2017-12-14 NOTE — ED Provider Notes (Signed)
[facility]-EMERGENCY DEPT Provider Note   CSN: [id number] Arrival date & time: 12/13/17  2100     History   Chief Complaint Chief Complaint  Patient presents with  . Right foot pain    HPI [name] is a 77 y.o. male.  HPI 77 year old male past medical history significant for CAD, hypertension, hyperlipidemia, chronic low back pain that presents to the emergency department today for evaluation of right foot pain.  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  Patient states that he initially felt okay and was walking around.  States that he went to the store when he got home he had increased pain to the right foot.  Patient also noted an abrasion to the right lower leg from the steel plate hitting it.  Patient has been able to ambulate on the foot but endorses severe pain.  Pain is worse with ambulation, range of motion and palpation.  He did take a Tylenol prior to arrival.  Unknown last tetanus shot.  Patient denies any other pain at this time.  Denies any associated paresthesias or weakness. Unknown last tdap.  Past Medical History:  Diagnosis Date  . CAD (coronary artery disease)    a. s/p stent RCA DES 9/09; b. 2014 Attempted PCI of OM1 @ [place];  c. 04/2014 Cath: LAD 40-50p, D1 95-99 (chronic), LCX 30-40 inf branch, OM1 CTO, RCA 30-40p, RCA patent stent, EF 35%->Med Rx.  . Chronic combined systolic and diastolic CHF (congestive heart failure) (HCC)    a. EF about 40 to 45% per echo in April 2013;  b. 04/2014 Echo: EF 20-25%, sev LVH, sev glob HK, Gr 1 DD, mildly reduced RV fxn, PASP . c. 01/2017: EF improved to 50-55%.   . Chronic low back pain   . COPD (chronic obstructive pulmonary disease) (HCC) dx 06/2013   PFTs 07/08/13: mod obst with resp to bronchodilator, moderate decrease diffusion, airtrapping  .  Dyslipidemia    a. on statin  . HLD (hyperlipidemia)   . HTN (hypertension)    a. Reports intolerance to hydralazine; b. no beta blockers 2/2 bradycardia;  c. failed on ACE and ARB.  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 LBBB (left bundle branch block)   . LVH (left ventricular hypertrophy)   . Mixed Ischemic/Non-ischemic Cardiomyopathy    a. 04/2014 Echo: EF 20-25%, sev glob HK.  [name] Noncompliance   . NSVT (nonsustained ventricular tachycardia) (HCC)   . Paroxysmal atrial fibrillation (HCC)    a. identified on device interrogation 01/2016    Patient Active Problem List   Diagnosis Date Noted  . COPD with acute exacerbation (HCC) 01/17/2017  . Allergic rhinitis 01/17/2017  . 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  . Nocturnal hypoxemia 07/03/2016  . Facial abscess 02/04/2016  . Dental abscess   . 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/12/2014  . Angina decubitus (HCC) 06/15/2014  . Acute renal failure (HCC) 05/31/2014  . Sinus bradycardia 05/31/2014  . Syncope 05/30/2014  . Chronic combined systolic and diastolic CHF (congestive heart failure) (HCC)   . Mixed Ischemic/Non-Ischemic Cardiomyopathy   . NSVT (nonsustained ventricular tachycardia) (HCC)   . HLD (hyperlipidemia)   . Respiratory arrest (HCC) 05/18/2014  . CAD (coronary artery disease) 05/18/2014  . Pulmonary edema 05/10/2014  . HTN (hypertension) 05/08/2014  . LBBB (left bundle branch  block) 05/08/2014  . 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  . Low back pain 10/05/2013  . NSVT (nonsustained ventricular tachycardia) (HCC) 09/04/2013  . Pneumonia 09/02/2013  . Flu-like symptoms 09/02/2013  . Anemia 09/02/2013  . COPD with asthma (HCC)   . Fatigue 06/18/2013  . Nocturia 06/18/2013  . Dyspnea 06/18/2013  . LV dysfunction 12/13/2011  . CAD (coronary artery disease) 10/10/2011  . Tobacco abuse 10/10/2011  . Dyslipidemia   . Essential hypertension   . Noncompliance     Past Surgical History:  Procedure Laterality Date    . BI-VENTRICULAR IMPLANTABLE CARDIOVERTER DEFIBRILLATOR N/A 08/12/2014   MDT Ovidio Kin XT CRTD implanted by Dr Johney Frame  . CARDIAC CATHETERIZATION     ejection fraction 50%  . LEFT HEART CATHETERIZATION WITH CORONARY ANGIOGRAM N/A 05/08/2014   Procedure: LEFT HEART CATHETERIZATION WITH CORONARY ANGIOGRAM;  Surgeon: Micheline Chapman, MD;  Location: [facility] CATH LAB;  Service: Cardiovascular;  Laterality: N/A;        Home Medications    Prior to Admission medications   Medication Sig Start Date End Date Taking? Authorizing Provider  albuterol (PROVENTIL HFA;VENTOLIN HFA) 108 (90 Base) MCG/ACT inhaler Inhale 1 puff into the lungs every 6 (six) hours as needed for wheezing or shortness of breath. 05/15/16  Yes Lupita Leash, MD  albuterol (PROVENTIL) (2.5 MG/3ML) 0.083% nebulizer solution Take 3 mLs (2.5 mg total) by nebulization every 6 (six) hours as needed for wheezing or shortness of breath. 01/18/17  Yes Nyoka Cowden, MD  amLODipine (NORVASC) 5 MG tablet TAKE 3 TABLETS BY MOUTH EVERY DAY TAKE 2 TABS IN THE AM AND 1 TAB IN THE EVENING Patient taking differently: Take 10 mg by mouth every morning 06/26/17  Yes [place], Peter M, MD  clopidogrel (PLAVIX) 75 MG tablet TAKE 1 TABLET BY MOUTH EVERY DAY 11/29/17  Yes [place], Peter M, MD  isosorbide mononitrate (IMDUR) 60 MG 24 hr tablet TAKE 1 TABLET (60 MG TOTAL) BY MOUTH DAILY. 05/30/17  Yes [place], Peter M, MD  losartan (COZAAR) 100 MG tablet Take 1 tablet (100 mg total) by mouth daily. Patient taking differently: Take 100 mg by mouth every other day.  05/15/16  Yes Barrett, Joline Salt, PA-C  nitroGLYCERIN (NITROSTAT) 0.4 MG SL tablet Place 1 tablet (0.4 mg total) under the tongue every 5 (five) minutes as needed for chest pain. 01/31/16 12/13/17 Yes Marily Lente, NP  SYMBICORT 160-4.5 MCG/ACT inhaler TAKE 2 PUFFS BY MOUTH TWICE A DAY 08/12/17  Yes Lupita Leash, MD  atorvastatin (LIPITOR) 20 MG tablet TAKE 1 TABLET EVERY DAY Patient not taking:  Reported on 12/13/2017 12/21/16   [place], Peter M, MD  KLOR-CON M20 20 MEQ tablet TAKE 2 TABLETS (40 MEQ TOTAL) BY MOUTH DAILY. Patient not taking: Reported on 12/13/2017 10/01/16   [place], Peter M, MD  traMADol (ULTRAM) 50 MG tablet Take 1 tablet (50 mg total) by mouth every 6 (six) hours as needed. 12/14/17   Rise Mu, PA-C    Family History Family History  Problem Relation Age of Onset  . Heart attack Neg Hx   . Stroke Neg Hx     Social History Social History   Tobacco Use  . Smoking status: Former Smoker    Packs/day: 0.50    Years: 56.00    Pack years: 28.00    Types: Cigarettes    Last attempt to quit: 09/10/2012    Years since quitting: 5.2  . Smokeless tobacco: Never Used  Substance Use Topics  .  Alcohol use: No  . Drug use: No     Allergies   Acyclovir and related and Aspirin   Review of Systems Review of Systems  All other systems reviewed and are negative.    Physical Exam Updated Vital Signs BP (!) 150/80 (BP Location: Right Arm)   Pulse 70   Temp 98.7 F (37.1 C) (Oral)   Resp 18   Ht 5' 8.5" (1.74 m)   Wt 72.6 kg (160 lb)   SpO2 96%   BMI 23.97 kg/m   Physical Exam  Constitutional: He appears well-developed and well-nourished. No distress.  HENT:  Head: Normocephalic and atraumatic.  Eyes: Right eye exhibits no discharge. Left eye exhibits no discharge. No scleral icterus.  Neck: Normal range of motion.  Cardiovascular: Intact distal pulses.  Pulmonary/Chest: No respiratory distress.  Musculoskeletal:       Right foot: There is tenderness, bony tenderness and swelling. There is normal capillary refill, no crepitus, no deformity and no laceration.       Feet:  Mild edema noted.  Limited range of motion secondary to pain.  Brisk cap refill.  DP pulses are 2+ bilaterally with Doppler.  Patient able to move toes without any difficulties.  Full range of motion of ankles without difficulties.  Patient does have a small abrasion to the right  shin with bleeding controlled.  No open laceration.  No pain to palpation over the lower tib-fib.  Neurological: He is alert.  Skin: Skin is warm and dry. Capillary refill takes less than 2 seconds. No pallor.  Psychiatric: His behavior is normal. Judgment and thought content normal.  Nursing note and vitals reviewed.    ED Treatments / Results  Labs (all labs ordered are listed, but only abnormal results are displayed) Labs Reviewed - No data to display  EKG None  Radiology Dg Foot Complete Right  Result Date: 12/13/2017 CLINICAL DATA:  Patient dropped something heavy on his right foot this morning and presents with right foot pain. EXAM: RIGHT FOOT COMPLETE - 3+ VIEW COMPARISON:  None. FINDINGS: There is no evidence of fracture or dislocation. There is no evidence of arthropathy or other focal bone abnormality. Vascular calcifications are noted anterior to the ankle and distal leg. Soft tissues are unremarkable. IMPRESSION: No acute fracture nor joint dislocation is identified. Should symptoms persist without improvement, follow-up radiographs in 7-10 days may help reveal a radiographically occult fracture. Electronically Signed   By: Tollie Eth M.D.   On: 12/13/2017 22:01    Procedures Procedures (including critical care time)  Medications Ordered in ED Medications  HYDROcodone-acetaminophen (NORCO/VICODIN) 5-325 MG per tablet 1 tablet (1 tablet Oral Given 12/13/17 2310)  Tdap (BOOSTRIX) injection 0.5 mL (0.5 mLs Intramuscular Given 12/13/17 2331)     Initial Impression / Assessment and Plan / ED Course  I have reviewed the triage vital signs and the nursing notes.  Pertinent labs & imaging results that were available during my care of the patient were reviewed by me and considered in my medical decision making (see chart for details).     Tdap up dated. Patient X-Ray negative for obvious fracture or dislocation.  Patient is neurovascularly intact.  Pain managed in ED. Pt  advised to follow up with orthopedics if symptoms persist for possibility of missed fracture diagnosis. Patient provided cam walker while in ED, conservative therapy recommended and discussed.   Pt is hemodynamically stable, in NAD, & able to ambulate in the ED. Evaluation does not show pathology  that would require ongoing emergent intervention or inpatient treatment. I explained the diagnosis to the patient. Pain has been managed & has no complaints prior to dc. Pt is comfortable with above plan and is stable for discharge at this time. All questions were answered prior to disposition. Strict return precautions for f/u to the ED were discussed. Encouraged follow up with PCP.'  Seen by my attending Dr. Erma Heritage who is agreeable with the above plan.   Final Clinical Impressions(s) / ED Diagnoses   Final diagnoses:  Right foot pain    ED Discharge Orders        Ordered    traMADol (ULTRAM) 50 MG tablet  Every 6 hours PRN     12/14/17 0014       Rise Mu, PA-C 12/14/17 6438    Shaune Pollack, MD 12/14/17 1018

## 2017-12-14 NOTE — Discharge Instructions (Addendum)
Your x-ray did not show any fractures.  Have given you a walking boot and use crutches.  Weight-bear as tolerated. Please rest, ice, compress and elevated the affected body part to help with swelling and pain.  Would recommend Tylenol Motrin for pain have given you short course of tramadol which is a narcotic and can make you drowsy so do not drive with this.  Have given you follow-up with orthopedic doctor if symptoms not improving need to be followed up next week for repeat x-rays if symptoms persist.

## 2017-12-26 ENCOUNTER — Other Ambulatory Visit: Payer: Self-pay | Admitting: Pulmonary Disease

## 2018-01-06 ENCOUNTER — Ambulatory Visit (INDEPENDENT_AMBULATORY_CARE_PROVIDER_SITE_OTHER): Payer: Medicare Other | Admitting: *Deleted

## 2018-01-06 ENCOUNTER — Telehealth: Payer: Self-pay | Admitting: Cardiology

## 2018-01-06 DIAGNOSIS — I255 Ischemic cardiomyopathy: Secondary | ICD-10-CM | POA: Diagnosis not present

## 2018-01-06 NOTE — Telephone Encounter (Signed)
Spoke with pt and reminded pt of remote transmission that is due today. Pt verbalized understanding.   

## 2018-01-07 ENCOUNTER — Encounter: Payer: Self-pay | Admitting: Cardiology

## 2018-01-07 NOTE — Progress Notes (Signed)
Remote ICD transmission.   

## 2018-01-08 LAB — CUP PACEART REMOTE DEVICE CHECK
Battery Remaining Longevity: 35 mo
Battery Voltage: 2.94 V
Brady Statistic AP VP Percent: 39 %
Brady Statistic AS VS Percent: 1.97 %
Brady Statistic RV Percent Paced: 94.9 %
HIGH POWER IMPEDANCE MEASURED VALUE: 69 Ohm
Implantable Lead Implant Date: 20151203
Implantable Lead Implant Date: 20151203
Implantable Lead Location: 753858
Implantable Lead Location: 753860
Implantable Lead Model: 4598
Implantable Lead Model: 5076
Implantable Lead Model: 6935
Lead Channel Impedance Value: 323 Ohm
Lead Channel Impedance Value: 342 Ohm
Lead Channel Impedance Value: 342 Ohm
Lead Channel Impedance Value: 342 Ohm
Lead Channel Impedance Value: 570 Ohm
Lead Channel Impedance Value: 570 Ohm
Lead Channel Impedance Value: 570 Ohm
Lead Channel Pacing Threshold Amplitude: 1.125 V
Lead Channel Pacing Threshold Amplitude: 1.25 V
Lead Channel Pacing Threshold Pulse Width: 0.4 ms
Lead Channel Sensing Intrinsic Amplitude: 0.75 mV
Lead Channel Sensing Intrinsic Amplitude: 0.75 mV
Lead Channel Sensing Intrinsic Amplitude: 17.125 mV
Lead Channel Setting Pacing Amplitude: 2.25 V
Lead Channel Setting Pacing Amplitude: 2.5 V
MDC IDC LEAD IMPLANT DT: 20151203
MDC IDC LEAD LOCATION: 753859
MDC IDC MSMT LEADCHNL LV IMPEDANCE VALUE: 323 Ohm
MDC IDC MSMT LEADCHNL LV IMPEDANCE VALUE: 342 Ohm
MDC IDC MSMT LEADCHNL LV IMPEDANCE VALUE: 342 Ohm
MDC IDC MSMT LEADCHNL LV IMPEDANCE VALUE: 570 Ohm
MDC IDC MSMT LEADCHNL LV IMPEDANCE VALUE: 570 Ohm
MDC IDC MSMT LEADCHNL LV PACING THRESHOLD PULSEWIDTH: 0.4 ms
MDC IDC MSMT LEADCHNL RA PACING THRESHOLD AMPLITUDE: 0.5 V
MDC IDC MSMT LEADCHNL RA PACING THRESHOLD PULSEWIDTH: 0.4 ms
MDC IDC MSMT LEADCHNL RV IMPEDANCE VALUE: 380 Ohm
MDC IDC MSMT LEADCHNL RV SENSING INTR AMPL: 17.125 mV
MDC IDC PG IMPLANT DT: 20151203
MDC IDC SESS DTM: 20190429210943
MDC IDC SET LEADCHNL LV PACING PULSEWIDTH: 0.4 ms
MDC IDC SET LEADCHNL RA PACING AMPLITUDE: 1.5 V
MDC IDC SET LEADCHNL RV PACING PULSEWIDTH: 0.4 ms
MDC IDC SET LEADCHNL RV SENSING SENSITIVITY: 0.3 mV
MDC IDC STAT BRADY AP VS PERCENT: 0.75 %
MDC IDC STAT BRADY AS VP PERCENT: 58.28 %
MDC IDC STAT BRADY RA PERCENT PACED: 38.76 %

## 2018-01-26 ENCOUNTER — Other Ambulatory Visit: Payer: Self-pay | Admitting: Pulmonary Disease

## 2018-02-04 ENCOUNTER — Telehealth: Payer: Self-pay | Admitting: Pulmonary Disease

## 2018-02-04 MED ORDER — BUDESONIDE-FORMOTEROL FUMARATE 160-4.5 MCG/ACT IN AERO: 2.0000 | INHALATION_SPRAY | Freq: Two times a day (BID) | RESPIRATORY_TRACT | 0 refills | Status: DC

## 2018-02-04 NOTE — Telephone Encounter (Signed)
lmtcb for pt. 1 sample of Symbicort has been left up front for pt pickup- needs to keep 6/4 OV for any future refills/samples.

## 2018-02-11 ENCOUNTER — Ambulatory Visit (INDEPENDENT_AMBULATORY_CARE_PROVIDER_SITE_OTHER): Payer: Self-pay | Admitting: Pulmonary Disease

## 2018-02-11 ENCOUNTER — Other Ambulatory Visit (INDEPENDENT_AMBULATORY_CARE_PROVIDER_SITE_OTHER): Payer: Self-pay

## 2018-02-11 ENCOUNTER — Encounter: Payer: Self-pay | Admitting: Pulmonary Disease

## 2018-02-11 VITALS — BP 102/62 | HR 57 | Ht 68.5 in | Wt 160.0 lb

## 2018-02-11 DIAGNOSIS — J449 Chronic obstructive pulmonary disease, unspecified: Secondary | ICD-10-CM

## 2018-02-11 DIAGNOSIS — R5382 Chronic fatigue, unspecified: Secondary | ICD-10-CM

## 2018-02-11 DIAGNOSIS — G4733 Obstructive sleep apnea (adult) (pediatric): Secondary | ICD-10-CM

## 2018-02-11 DIAGNOSIS — Z720 Tobacco use: Secondary | ICD-10-CM

## 2018-02-11 DIAGNOSIS — J301 Allergic rhinitis due to pollen: Secondary | ICD-10-CM

## 2018-02-11 DIAGNOSIS — J4489 Other specified chronic obstructive pulmonary disease: Secondary | ICD-10-CM

## 2018-02-11 MED ORDER — ALBUTEROL SULFATE HFA 108 (90 BASE) MCG/ACT IN AERS: 1.0000 | INHALATION_SPRAY | Freq: Four times a day (QID) | RESPIRATORY_TRACT | 5 refills | Status: DC | PRN

## 2018-02-11 MED ORDER — BUDESONIDE-FORMOTEROL FUMARATE 160-4.5 MCG/ACT IN AERO: INHALATION_SPRAY | RESPIRATORY_TRACT | 6 refills | Status: DC

## 2018-02-11 MED ORDER — ALBUTEROL SULFATE (2.5 MG/3ML) 0.083% IN NEBU: 2.5000 mg | INHALATION_SOLUTION | Freq: Four times a day (QID) | RESPIRATORY_TRACT | 11 refills | Status: DC | PRN

## 2018-02-11 NOTE — Patient Instructions (Addendum)
Labs today as ordered Referral for primary care patient willing to establish with primary care at [facility] Will order home sleep study Patient needs to establish appointment with primary care  Patient to follow-up in 3 months to see Dr. Kendrick Fries       Please contact the office if your symptoms worsen or you have concerns that you are not improving.   Thank you for choosing [facility] for your healthcare, and for allowing [place] to partner with you on your healthcare journey. I am thankful to be able to provide care to you today.   Elisha Headland FNP-C

## 2018-02-11 NOTE — Assessment & Plan Note (Signed)
Home sleep study ordered today We will do routine lab work today Referral for primary care placed

## 2018-02-11 NOTE — Assessment & Plan Note (Signed)
Refilled Symbicort today Refilled rescue inhaler Follow-up with Dr. Kendrick Fries in 3 months

## 2018-02-11 NOTE — Progress Notes (Signed)
@Patient  ID: [name], male    DOB: 1941/04/28, 77 y.o.   MRN: [id number]  Chief Complaint  Patient presents with  . Follow-up    Doing okay but feels that the ventolin made his breathing worse. feels like he is snoring , feeling more tired.wife says he is breathing at night.    Referring provider: No ref. provider found  HPI: Synopsis: COPD October through November 2017 he was diagnosed with nocturnal hypoxemia but stopped using supplemental oxygen because of sinus congestion and refused further use.   Recent [facility] Encounters:  01/17/2017 office visit-[place] flare as well as COPD exacerbation plan is listed below  Chest x-ray Prednisone 5 days Albuterol nebulized 2-3 times a day as needed for wheezing and shortness of breath Continue Symbicort Call us if no improvement  Allergic rhinitis Use Neil Med rinses with distilled water at least twice per day using the instructions on the package. 1/2 hour after using the [facility] Med rinse, use Nasacort two puffs in each nostril once per day.  Remember that the Nasacort can take 1-2 weeks to work after regular use. Use generic zyrtec (cetirizine) every day.  If this doesn't help, then stop taking it and use chlorpheniramine-phenylephrine combination tablets.    Tests:  07/17/2016- PFT- moderate airflow obstruction, mild restriction, reduced diffusion capacity with  06/11/16 - home sleep study shows AHI of 2.9 an hour with the lowest SaO2 of 79% with an average of 93%   Imaging:  05/15/2016-chest x-ray-hyperexpansion with areas of scarring bilaterally 02/04/2016-CT maxillofacial with contrast- prominent soft tissue swelling, mucosal thickening of the right maxillary sinus and small mucous retention cyst or polyp to the left maxillary sinus, numerous large maxillary mandibular dental caries noted  Cardiac:  02/20/2016-echocardiogram-LV ejection fraction 45 to 50%, mild regurgitation in the mitral valve  Labs:    Micro:   Chart Review:  12/13/2017-ED admission-right foot pain    02/11/18  OV    Patient presents today for scheduled office visit.  Patient was told he had a follow-up with our office in order to get more refills of his Symbicort.  Patient reporting no concerns at this point in time with his medications.  Patient is stating that his wife continues to wake him up due to his snoring, sometimes he stops snoring in the middle of his sleep and his wife feels like he stop breathing.  He does have daytime sleepiness as well as fatigue.  Patient also reports that sometimes he cat naps after work.  Patient admits that in the past other medical providers have talked with him about him potentially needing a sleep study.  Patient also reporting having occasional leg cramps as well as cramping in his hands.     Allergies  Allergen Reactions  . Acyclovir And Related     unknown  . Aspirin Other (See Comments)    GI upset at high doses only.    Immunization History  Administered Date(s) Administered  . Influenza,inj,Quad PF,6+ Mos 06/10/2014  . Pneumococcal Conjugate-13 11/12/2013  . Tdap 12/13/2017    Past Medical History:  Diagnosis Date  . CAD (coronary artery disease)    a. s/p stent RCA DES 9/09; b. 2014 Attempted PCI of OM1 @ [place];  c. 04/2014 Cath: LAD 40-50p, D1 95-99 (chronic), LCX 30-40 inf branch, OM1 CTO, RCA 30-40p, RCA patent stent, EF 35%->Med Rx.  . Chronic combined systolic and diastolic CHF (congestive heart failure) (HCC)    a. EF about 40  to 45% per echo in April 2013;  b. 04/2014 Echo: EF 20-25%, sev LVH, sev glob HK, Gr 1 DD, mildly reduced RV fxn, PASP . c. 01/2017: EF improved to 50-55%.   . Chronic low back pain   . COPD (chronic obstructive pulmonary disease) (HCC) dx 06/2013   PFTs 07/08/13: mod obst with resp to bronchodilator, moderate decrease diffusion, airtrapping  . Dyslipidemia    a. on statin  . HLD (hyperlipidemia)   . HTN (hypertension)     a. Reports intolerance to hydralazine; b. no beta blockers 2/2 bradycardia;  c. failed on ACE and ARB.  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 LBBB (left bundle branch block)   . LVH (left ventricular hypertrophy)   . Mixed Ischemic/Non-ischemic Cardiomyopathy    a. 04/2014 Echo: EF 20-25%, sev glob HK.  [name] Noncompliance   . NSVT (nonsustained ventricular tachycardia) (HCC)   . Paroxysmal atrial fibrillation (HCC)    a. identified on device interrogation 01/2016    Tobacco History: Social History   Tobacco Use  Smoking Status Former Smoker  . Packs/day: 0.50  . Years: 56.00  . Pack years: 28.00  . Types: Cigarettes  . Last attempt to quit: 09/10/2012  . Years since quitting: 5.4  Smokeless Tobacco Never Used   Counseling given: Yes Continue not smoking.  Outpatient Encounter Medications as of 02/11/2018  Medication Sig  . amLODipine (NORVASC) 5 MG tablet TAKE 3 TABLETS BY MOUTH EVERY DAY TAKE 2 TABS IN THE AM AND 1 TAB IN THE EVENING (Patient taking differently: Take 10 mg by mouth every morning)  . atorvastatin (LIPITOR) 20 MG tablet TAKE 1 TABLET EVERY DAY  . clopidogrel (PLAVIX) 75 MG tablet TAKE 1 TABLET BY MOUTH EVERY DAY  . isosorbide mononitrate (IMDUR) 60 MG 24 hr tablet TAKE 1 TABLET (60 MG TOTAL) BY MOUTH DAILY.  [name] KLOR-CON M20 20 MEQ tablet TAKE 2 TABLETS (40 MEQ TOTAL) BY MOUTH DAILY.  [name] losartan (COZAAR) 100 MG tablet Take 1 tablet (100 mg total) by mouth daily. (Patient taking differently: Take 100 mg by mouth every other day. )  . SYMBICORT 160-4.5 MCG/ACT inhaler TAKE 2 PUFFS BY MOUTH TWICE A DAY  . traMADol (ULTRAM) 50 MG tablet Take 1 tablet (50 mg total) by mouth every 6 (six) hours as needed.  [name] albuterol (PROVENTIL HFA;VENTOLIN HFA) 108 (90 Base) MCG/ACT inhaler Inhale 1 puff into the lungs every 6 (six) hours as needed for wheezing or shortness of breath. (Patient not taking: Reported on 02/11/2018)  . albuterol (PROVENTIL) (2.5 MG/3ML) 0.083% nebulizer  solution Take 3 mLs (2.5 mg total) by nebulization every 6 (six) hours as needed for wheezing or shortness of breath. (Patient not taking: Reported on 02/11/2018)  . nitroGLYCERIN (NITROSTAT) 0.4 MG SL tablet Place 1 tablet (0.4 mg total) under the tongue every 5 (five) minutes as needed for chest pain.  . [DISCONTINUED] budesonide-formoterol (SYMBICORT) 160-4.5 MCG/ACT inhaler Inhale 2 puffs into the lungs 2 (two) times daily.   No facility-administered encounter medications on file as of 02/11/2018.    Forgets to take symbicort 1-2x a week.   Review of Systems  Constitutional:  +daytime fatigue No  weight loss, night sweats,  fevers, chills HEENT:   No headaches,  Difficulty swallowing,  Tooth/dental problems, or  Sore throat, No sneezing, itching, ear ache, nasal congestion, post nasal drip  CV: No chest pain,  orthopnea, PND, swelling in lower extremities, anasarca, dizziness, palpitations, syncope  GI: No heartburn, indigestion, abdominal  pain, nausea, vomiting, diarrhea, change in bowel habits, loss of appetite, bloody stools Resp: +sob with exertion, No shortness of breath with rest.  No excess mucus, no productive cough,  No non-productive cough,  No coughing up of blood.  No change in color of mucus.  No wheezing.  No chest wall deformity Skin: no rash, lesions, no skin changes. GU: no dysuria, change in color of urine, no urgency or frequency.  No flank pain, no hematuria  MS: +cramping in hands and legs occasionally No joint pain or swelling.  No decreased range of motion.  No back pain. Psych:  No change in mood or affect. No depression or anxiety.  No memory loss.  Sleep ROS:  Admits: nightmares, snoring, thinks he has restless legs, wife wakes him up when he appears to stop breathing  He denies sleep walking, sleep talking, bruxism.  There is no history of restless legs.  He denies sleep hallucinations, sleep paralysis, or cataplexy.    Physical Exam  BP 102/62 (BP Location:  Left Arm, Cuff Size: Normal)   Pulse (!) 57   Ht 5' 8.5" (1.74 m)   Wt 160 lb (72.6 kg)   SpO2 97%   BMI 23.97 kg/m   GEN: A/Ox3; pleasant , NAD, well nourished    HEENT:  [place]/AT,  EACs-clear, TMs-wnl, NOSE-clear, THROAT- +post nasal drip, no lesions   NECK:  Supple w/ fair ROM; no JVD; no thyromegaly or nodules palpated; no lymphadenopathy.    RESP:  +good air movement in lobes, slight expiratory wheeze  no accessory muscle use, no dullness to percussion  CARD:  RRR, no m/r/g, no peripheral edema, pulses intact, no cyanosis or clubbing.  GI:   Soft & nt; nml bowel sounds; no organomegaly or masses detected.   Musco: Warm bil, no deformities or joint swelling noted.   Neuro: alert, no focal deficits noted.    Skin: Warm, no lesions or rashes    Lab Results:  CBC    Component Value Date/Time   WBC 9.4 02/04/2016 0503   RBC 3.86 (L) 02/04/2016 0503   HGB 10.7 (L) 02/04/2016 0503   HCT 34.3 (L) 02/04/2016 0503   PLT 218 02/04/2016 0503   MCV 88.9 02/04/2016 0503   MCH 27.7 02/04/2016 0503   MCHC 31.2 02/04/2016 0503   RDW 14.0 02/04/2016 0503   LYMPHSABS 1.5 02/03/2016 2220   MONOABS 1.1 (H) 02/03/2016 2220   EOSABS 0.0 02/03/2016 2220   BASOSABS 0.0 02/03/2016 2220    BMET    Component Value Date/Time   NA 143 04/17/2016 0330   K 4.4 04/17/2016 0330   CL 106 04/17/2016 0330   CO2 28 04/17/2016 0330   GLUCOSE 89 04/17/2016 0330   BUN 9 04/17/2016 0330   CREATININE 0.83 04/17/2016 0330   CALCIUM 9.3 04/17/2016 0330   GFRNONAA 88 04/17/2016 0330   GFRAA >89 04/17/2016 0330    BNP    Component Value Date/Time   BNP 95.4 04/17/2016 1055    ProBNP    Component Value Date/Time   PROBNP 218.0 (H) 01/26/2015 1143    Imaging: No results found.   Assessment & Plan:   We will refill Symbicort today.  We will also order home sleep study, as this is much more convenient for patient due to work schedule and life.  Explained to patient that we may have to  do a in lab sleep study due to history of CHF.  Patient is agreeable to in lab study  if possible.  Discussed case with Dr. Kendrick Fries.  Patient is to follow-up with Dr. Kendrick Fries in 3 months.   Allergic rhinitis Restart daily antihistamine >>> Can use generic Zyrtec Nasal rinses as needed Can use Flonase 1 spray each nostril daily as needed for allergy symptoms  Fatigue Home sleep study ordered today We will do routine lab work today Referral for primary care placed   Tobacco abuse In remission currently not smoking or using tobacco   COPD with asthma (HCC) Refilled Symbicort today Refilled rescue inhaler Follow-up with Dr. Kendrick Fries in 3 months       Coral Ceo, NP 02/11/2018

## 2018-02-11 NOTE — Assessment & Plan Note (Signed)
Restart daily antihistamine >>> Can use generic Zyrtec Nasal rinses as needed Can use Flonase 1 spray each nostril daily as needed for allergy symptoms

## 2018-02-11 NOTE — Assessment & Plan Note (Signed)
In remission currently not smoking or using tobacco

## 2018-02-12 LAB — COMPREHENSIVE METABOLIC PANEL
ALBUMIN: 4.4 g/dL (ref 3.5–5.2)
ALK PHOS: 91 U/L (ref 39–117)
ALT: 9 U/L (ref 0–53)
AST: 14 U/L (ref 0–37)
BILIRUBIN TOTAL: 0.3 mg/dL (ref 0.2–1.2)
BUN: 10 mg/dL (ref 6–23)
CHLORIDE: 104 meq/L (ref 96–112)
CO2: 28 mEq/L (ref 19–32)
CREATININE: 0.87 mg/dL (ref 0.40–1.50)
Calcium: 9.3 mg/dL (ref 8.4–10.5)
GFR: 110.18 mL/min (ref >60.00)
Glucose, Bld: 100 mg/dL — ABNORMAL HIGH (ref 70–99)
Potassium: 3.8 mEq/L (ref 3.5–5.1)
SODIUM: 140 meq/L (ref 135–145)
TOTAL PROTEIN: 7.3 g/dL (ref 6.0–8.3)

## 2018-02-12 NOTE — Progress Notes (Signed)
Reviewed, agree 

## 2018-02-13 ENCOUNTER — Telehealth: Payer: Self-pay | Admitting: Pulmonary Disease

## 2018-02-13 NOTE — Telephone Encounter (Signed)
Patient returned call.  Results and recommendations given.  Patient stated understanding. Nothing further at this time.

## 2018-02-13 NOTE — Telephone Encounter (Signed)
Called and left message for Patient to call back. 

## 2018-03-08 DIAGNOSIS — G4733 Obstructive sleep apnea (adult) (pediatric): Secondary | ICD-10-CM

## 2018-03-10 ENCOUNTER — Other Ambulatory Visit: Payer: Self-pay | Admitting: *Deleted

## 2018-03-10 DIAGNOSIS — G4733 Obstructive sleep apnea (adult) (pediatric): Secondary | ICD-10-CM

## 2018-03-16 DIAGNOSIS — G4733 Obstructive sleep apnea (adult) (pediatric): Secondary | ICD-10-CM | POA: Diagnosis not present

## 2018-03-21 ENCOUNTER — Ambulatory Visit: Payer: Self-pay | Admitting: Nurse Practitioner

## 2018-03-31 ENCOUNTER — Other Ambulatory Visit: Payer: Self-pay | Admitting: Cardiology

## 2018-04-07 ENCOUNTER — Ambulatory Visit (INDEPENDENT_AMBULATORY_CARE_PROVIDER_SITE_OTHER): Payer: Medicare Other | Admitting: *Deleted

## 2018-04-07 DIAGNOSIS — I255 Ischemic cardiomyopathy: Secondary | ICD-10-CM

## 2018-04-07 NOTE — Progress Notes (Signed)
Remote ICD transmission.   

## 2018-04-08 ENCOUNTER — Encounter: Payer: Self-pay | Admitting: Cardiology

## 2018-04-11 LAB — CUP PACEART REMOTE DEVICE CHECK
Battery Remaining Longevity: 32 mo
Battery Voltage: 2.95 V
Brady Statistic AP VP Percent: 41.22 %
Brady Statistic AP VS Percent: 1.01 %
Brady Statistic AS VS Percent: 2.49 %
Brady Statistic RA Percent Paced: 40.45 %
Brady Statistic RV Percent Paced: 92.31 %
HighPow Impedance: 66 Ohm
Implantable Lead Implant Date: 20151203
Implantable Lead Location: 753858
Implantable Lead Location: 753859
Implantable Lead Location: 753860
Implantable Lead Model: 5076
Lead Channel Impedance Value: 304 Ohm
Lead Channel Impedance Value: 304 Ohm
Lead Channel Impedance Value: 342 Ohm
Lead Channel Impedance Value: 342 Ohm
Lead Channel Impedance Value: 342 Ohm
Lead Channel Impedance Value: 513 Ohm
Lead Channel Impedance Value: 513 Ohm
Lead Channel Impedance Value: 513 Ohm
Lead Channel Impedance Value: 532 Ohm
Lead Channel Impedance Value: 532 Ohm
Lead Channel Pacing Threshold Amplitude: 0.5 V
Lead Channel Pacing Threshold Amplitude: 1 V
Lead Channel Pacing Threshold Pulse Width: 0.4 ms
Lead Channel Pacing Threshold Pulse Width: 0.4 ms
Lead Channel Sensing Intrinsic Amplitude: 0.5 mV
Lead Channel Sensing Intrinsic Amplitude: 0.5 mV
Lead Channel Sensing Intrinsic Amplitude: 15.125 mV
Lead Channel Setting Pacing Amplitude: 2.25 V
Lead Channel Setting Sensing Sensitivity: 0.3 mV
MDC IDC LEAD IMPLANT DT: 20151203
MDC IDC LEAD IMPLANT DT: 20151203
MDC IDC MSMT LEADCHNL LV IMPEDANCE VALUE: 304 Ohm
MDC IDC MSMT LEADCHNL LV IMPEDANCE VALUE: 323 Ohm
MDC IDC MSMT LEADCHNL RA IMPEDANCE VALUE: 342 Ohm
MDC IDC MSMT LEADCHNL RV PACING THRESHOLD AMPLITUDE: 1.125 V
MDC IDC MSMT LEADCHNL RV PACING THRESHOLD PULSEWIDTH: 0.4 ms
MDC IDC MSMT LEADCHNL RV SENSING INTR AMPL: 15.125 mV
MDC IDC PG IMPLANT DT: 20151203
MDC IDC SESS DTM: 20190729071604
MDC IDC SET LEADCHNL LV PACING AMPLITUDE: 2 V
MDC IDC SET LEADCHNL LV PACING PULSEWIDTH: 0.4 ms
MDC IDC SET LEADCHNL RA PACING AMPLITUDE: 1.5 V
MDC IDC SET LEADCHNL RV PACING PULSEWIDTH: 0.4 ms
MDC IDC STAT BRADY AS VP PERCENT: 55.28 %

## 2018-04-14 ENCOUNTER — Telehealth: Payer: Self-pay

## 2018-04-14 NOTE — Telephone Encounter (Signed)
-----   Message from Hillis Range, MD sent at 04/12/2018 11:19 AM EDT ----- Remote device check reviewed.   Device report notable for:   Arrhythmia noted with appropriate therapy.  Will continue to follow and see as scheduled in October. Please make sure he knows of driving restriction with treated arrhythmia

## 2018-04-14 NOTE — Telephone Encounter (Signed)
LVM for pt to call back regarding episodes treated with ATP direct number to device clinic given.

## 2018-04-14 NOTE — Telephone Encounter (Signed)
LVM for pt to call device clinic back regarding ATP episodes from 6/19. Will attempt call after 3:00pm

## 2018-04-15 NOTE — Telephone Encounter (Signed)
Spoke with pt regarding ATP episodes from 6/19 pt couldn't recall any symptoms pt voiced understanding of 6 month driving restrictions from date of therapies. Pt also asked about results from sleep apnea test informed him that I would send a message to his pulmonary MD to have someone call and discuss the results with him.

## 2018-04-15 NOTE — Telephone Encounter (Signed)
Pt returning [place] phone call.

## 2018-04-17 ENCOUNTER — Telehealth: Payer: Self-pay | Admitting: *Deleted

## 2018-04-17 NOTE — Telephone Encounter (Signed)
-----   Message from Dorathy Daft, RN sent at 04/15/2018  9:59 AM EDT ----- I spoke this pt regarding therapies from his ICD he stated that he has not heard the results from his sleep apnea test could someone call him with these?  Thanks

## 2018-04-17 NOTE — Telephone Encounter (Signed)
Home sleep test done 6.29.19 Arlys John please advise on results, thank you

## 2018-04-17 NOTE — Progress Notes (Signed)
@Patient  ID: [name], male    DOB: 03/29/1941, 77 y.o.   MRN: [id number]  Chief Complaint  Patient presents with  . Follow-up    Sleep study results    Referring provider: No ref. provider found  HPI: 77 year old male patient followed in our office for COPD, prev history of nocturnal hypoxemia (pt declined nocturnal oxygen, recently diagnosed obstructive sleep apnea (sleep study June/2019-AHI 7-hour).  Patient of Dr. Kendrick Fries  Former Smoker. Quit in 2014. 28 pack years.  Recent [facility] Encounters:   02/11/18 -OV - BM Patient presents today for scheduled office visit.  Follow-up to get more refills of Symbicort.  Patient is also reporting some sleep symptoms.  Wife says that he snores and also stops breathing in the middle of his sleeping.  He does have some daytime sleepiness.  Patient also reported having some leg cramps and cramping in his hands.  Patient admits that providers in the past have discussed him getting a sleep study.  He had declined previously. Plan: Home sleep study, start antihistamine    Tests:  07/17/2016- PFT- moderate airflow obstruction, mild restriction, reduced diffusion capacity with  06/11/16 - home sleep study shows AHI of 2.9 an hour with the lowest SaO2 of 79% with an average of 93%  03/08/2018-Home sleep study-AHI 7/HR, SaO2 low 81%  Imaging:  05/15/2016-chest x-ray-hyperexpansion with areas of scarring bilaterally 02/04/2016-CT maxillofacial with contrast- prominent soft tissue swelling, mucosal thickening of the right maxillary sinus and small mucous retention cyst or polyp to the left maxillary sinus, numerous large maxillary mandibular dental caries noted  Cardiac:  02/20/2016-echocardiogram-LV ejection fraction 45 to 50%, mild regurgitation in the mitral valve  Labs:   Micro:   Chart Review:  12/13/2017-ED admission-right foot pain       04/18/18 OV   Patient presenting to office today to discuss recent home sleep study  results.  Home sleep study found to have AHI of 7/HR, as well as an SaO2 low of 81%.  Patient was initially offered a trial of CPAP therapy.  Patient wanted to present to our office today to discuss options for treatment.  Patient is hypertensive in office visit today.  Patient has not been established with primary care like I had instructed him to do at last office visit.  Patient has multiple questions regarding sleep apnea whether or not he should be treated for this.  Patient is reporting daytime sleepiness and fatigue as well as moments when he sleeping where he wakes up gasping for air.       Allergies  Allergen Reactions  . Acyclovir And Related     unknown  . Aspirin Other (See Comments)    GI upset at high doses only.    Immunization History  Administered Date(s) Administered  . Influenza,inj,Quad PF,6+ Mos 06/10/2014  . Pneumococcal Conjugate-13 11/12/2013  . Tdap 12/13/2017    Past Medical History:  Diagnosis Date  . CAD (coronary artery disease)    a. s/p stent RCA DES 9/09; b. 2014 Attempted PCI of OM1 @ [place] [facility];  c. 04/2014 Cath: LAD 40-50p, D1 95-99 (chronic), LCX 30-40 inf branch, OM1 CTO, RCA 30-40p, RCA patent stent, EF 35%->Med Rx.  . Chronic combined systolic and diastolic CHF (congestive heart failure) (HCC)    a. EF about 40 to 45% per echo in April 2013;  b. 04/2014 Echo: EF 20-25%, sev LVH, sev glob HK, Gr 1 DD, mildly reduced RV fxn, PASP . c. 01/2017: EF improved to  50-55%.   . Chronic low back pain   . COPD (chronic obstructive pulmonary disease) (HCC) dx 06/2013   PFTs 07/08/13: mod obst with resp to bronchodilator, moderate decrease diffusion, airtrapping  . Dyslipidemia    a. on statin  . HLD (hyperlipidemia)   . HTN (hypertension)    a. Reports intolerance to hydralazine; b. no beta blockers 2/2 bradycardia;  c. failed on ACE and ARB.  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 LBBB (left bundle branch block)   . LVH (left ventricular  hypertrophy)   . Mixed Ischemic/Non-ischemic Cardiomyopathy    a. 04/2014 Echo: EF 20-25%, sev glob HK.  [name] Noncompliance   . NSVT (nonsustained ventricular tachycardia) (HCC)   . Paroxysmal atrial fibrillation (HCC)    a. identified on device interrogation 01/2016    Tobacco History: Social History   Tobacco Use  Smoking Status Former Smoker  . Packs/day: 0.50  . Years: 56.00  . Pack years: 28.00  . Types: Cigarettes  . Last attempt to quit: 09/10/2012  . Years since quitting: 5.6  Smokeless Tobacco Never Used   Counseling given: Yes Continue not smoking  Outpatient Encounter Medications as of 04/18/2018  Medication Sig  . albuterol (PROVENTIL HFA;VENTOLIN HFA) 108 (90 Base) MCG/ACT inhaler Inhale 1 puff into the lungs every 6 (six) hours as needed for wheezing or shortness of breath.  [name] albuterol (PROVENTIL) (2.5 MG/3ML) 0.083% nebulizer solution Take 3 mLs (2.5 mg total) by nebulization every 6 (six) hours as needed for wheezing or shortness of breath.  [name] amLODipine (NORVASC) 5 MG tablet TAKE 3 TABLETS BY MOUTH EVERY DAY TAKE 2 TABS IN THE AM AND 1 TAB IN THE EVENING  . atorvastatin (LIPITOR) 20 MG tablet TAKE 1 TABLET EVERY DAY  . budesonide-formoterol (SYMBICORT) 160-4.5 MCG/ACT inhaler TAKE 2 PUFFS BY MOUTH TWICE A DAY  . clopidogrel (PLAVIX) 75 MG tablet TAKE 1 TABLET BY MOUTH EVERY DAY  . isosorbide mononitrate (IMDUR) 60 MG 24 hr tablet TAKE 1 TABLET (60 MG TOTAL) BY MOUTH DAILY.  [name] KLOR-CON M20 20 MEQ tablet TAKE 2 TABLETS (40 MEQ TOTAL) BY MOUTH DAILY.  [name] losartan (COZAAR) 100 MG tablet Take 1 tablet (100 mg total) by mouth daily. (Patient taking differently: Take 100 mg by mouth every other day. )  . traMADol (ULTRAM) 50 MG tablet Take 1 tablet (50 mg total) by mouth every 6 (six) hours as needed.  . nitroGLYCERIN (NITROSTAT) 0.4 MG SL tablet Place 1 tablet (0.4 mg total) under the tongue every 5 (five) minutes as needed for chest pain.   No facility-administered encounter  medications on file as of 04/18/2018.      Review of Systems  Review of Systems  Constitutional: Positive for fatigue. Negative for activity change, chills, fever and unexpected weight change.  HENT: Negative for sinus pressure, sneezing and sore throat.        Dry mouth  Respiratory: Negative for cough, shortness of breath and wheezing.   Cardiovascular: Negative for chest pain and palpitations.  Gastrointestinal: Negative for constipation, diarrhea, nausea and vomiting.  Genitourinary: Negative for hematuria and urgency.  Musculoskeletal: Negative for arthralgias.  Skin: Negative for color change.  Neurological: Negative for dizziness, seizures and headaches.  Psychiatric/Behavioral: Positive for sleep disturbance. Negative for dysphoric mood. The patient is not nervous/anxious.   All other systems reviewed and are negative.      Physical Exam  BP (!) 158/90   Pulse 85   Ht 5\' 8"  (1.727 m)  Wt 161 lb 12.8 oz (73.4 kg)   SpO2 98%   BMI 24.60 kg/m   >>>158/82>>> recheck blood pressure  Wt Readings from Last 5 Encounters:  04/18/18 161 lb 12.8 oz (73.4 kg)  02/11/18 160 lb (72.6 kg)  12/13/17 160 lb (72.6 kg)  06/10/17 151 lb (68.5 kg)  02/19/17 151 lb 3.2 oz (68.6 kg)     Physical Exam  Constitutional: He is oriented to person, place, and time and well-developed, well-nourished, and in no distress. No distress.  HENT:  Head: Normocephalic and atraumatic.  Right Ear: Hearing, tympanic membrane, external ear and ear canal normal.  Left Ear: Hearing, tympanic membrane, external ear and ear canal normal.  Mouth/Throat: Uvula is midline and oropharynx is clear and moist. No oropharyngeal exudate.  Eyes: Pupils are equal, round, and reactive to light.  Neck: Normal range of motion. Neck supple. No JVD present.  Cardiovascular: Normal rate, regular rhythm and normal heart sounds.  Pulmonary/Chest: Effort normal and breath sounds normal. No accessory muscle usage. No  respiratory distress. He has no decreased breath sounds. He has no wheezes. He has no rhonchi.  Abdominal: Soft. Bowel sounds are normal. There is no tenderness.  Musculoskeletal: Normal range of motion. He exhibits no edema.  Lymphadenopathy:    He has no cervical adenopathy.  Neurological: He is alert and oriented to person, place, and time. Gait normal.  Skin: Skin is warm and dry. He is not diaphoretic. No erythema.  Psychiatric: Mood, memory, affect and judgment normal.  Nursing note and vitals reviewed.    Lab Results:  CBC    Component Value Date/Time   WBC 9.4 02/04/2016 0503   RBC 3.86 (L) 02/04/2016 0503   HGB 10.7 (L) 02/04/2016 0503   HCT 34.3 (L) 02/04/2016 0503   PLT 218 02/04/2016 0503   MCV 88.9 02/04/2016 0503   MCH 27.7 02/04/2016 0503   MCHC 31.2 02/04/2016 0503   RDW 14.0 02/04/2016 0503   LYMPHSABS 1.5 02/03/2016 2220   MONOABS 1.1 (H) 02/03/2016 2220   EOSABS 0.0 02/03/2016 2220   BASOSABS 0.0 02/03/2016 2220    BMET    Component Value Date/Time   NA 140 02/11/2018 1728   K 3.8 02/11/2018 1728   CL 104 02/11/2018 1728   CO2 28 02/11/2018 1728   GLUCOSE 100 (H) 02/11/2018 1728   BUN 10 02/11/2018 1728   CREATININE 0.87 02/11/2018 1728   CREATININE 0.83 04/17/2016 0330   CALCIUM 9.3 02/11/2018 1728   GFRNONAA 88 04/17/2016 0330   GFRAA >89 04/17/2016 0330    BNP    Component Value Date/Time   BNP 95.4 04/17/2016 1055    ProBNP    Component Value Date/Time   PROBNP 218.0 (H) 01/26/2015 1143    Imaging: No results found.   Assessment & Plan:   Pleasant 77 year old patient will get started on CPAP therapy.  Discussed with patient he will need to follow-up with [place] in about 8 weeks in order to show 30-day compliance to CPAP use.  At that appointment we can vaccine for flu as well as Pneumovax as it appears the patient has not received that in the past.  I suspect that patient will end up needing oxygen at night.  Due to his previous  history of nocturnal hypoxemia.  Will readdress with patient once he is compliant with CPAP use.  We will continue patient on Symbicort 160 at this time  Encourage patient to follow-up with cardiology regarding his blood pressure as  well as to establish with primary care.  Referral for primary care provided.  Healthcare maintenance Flu shot at next office visit Pneumovax at next office visit  Tobacco abuse Continue not smoking  OSA (obstructive sleep apnea) Will order CPAP for patient to start on >>>5-15 APAP  Keep up the hard work using your device.   Remember:  . Do not drive or operate heavy machinery if tired or drowsy.  . Please notify the supply company and office if you are unable to use your device regularly due to missing supplies or machine being broken.  . Work on maintaining a healthy weight and following your recommended nutrition plan  . Maintain proper daily exercise and movement  . Maintaining proper use of your device can also help improve management of other chronic illnesses such as: Blood pressure, blood sugars, and weight management.   CPAP Cleaning:  Clean weekly, with Dawn soap, and bottle brush.  Set up to air dry.    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

## 2018-04-17 NOTE — Telephone Encounter (Signed)
Spoke with pt. He is aware of results. Pt would prefer to come in for an appointment to go over his results. OV has been scheduled with Arlys John on 04/18/18 at 4:30pm. Nothing further was needed.

## 2018-04-17 NOTE — Telephone Encounter (Signed)
Home sleep study- 03/08/2018- AHI 7.0 / hr, SaO2 low 81%  Mild OSA.  Patient can start CPAP 5-15 auto titrating.  Or can present to our office for a visit to further discuss.  Elisha Headland FNP

## 2018-04-18 ENCOUNTER — Encounter: Payer: Self-pay | Admitting: Pulmonary Disease

## 2018-04-18 ENCOUNTER — Ambulatory Visit (INDEPENDENT_AMBULATORY_CARE_PROVIDER_SITE_OTHER): Payer: Medicare Other | Admitting: Pulmonary Disease

## 2018-04-18 VITALS — BP 158/90 | HR 85 | Ht 68.0 in | Wt 161.8 lb

## 2018-04-18 DIAGNOSIS — G4734 Idiopathic sleep related nonobstructive alveolar hypoventilation: Secondary | ICD-10-CM | POA: Diagnosis not present

## 2018-04-18 DIAGNOSIS — J449 Chronic obstructive pulmonary disease, unspecified: Secondary | ICD-10-CM

## 2018-04-18 DIAGNOSIS — G4733 Obstructive sleep apnea (adult) (pediatric): Secondary | ICD-10-CM | POA: Insufficient documentation

## 2018-04-18 DIAGNOSIS — Z0000 Encounter for general adult medical examination without abnormal findings: Secondary | ICD-10-CM

## 2018-04-18 DIAGNOSIS — Z720 Tobacco use: Secondary | ICD-10-CM | POA: Diagnosis not present

## 2018-04-18 HISTORY — DX: Obstructive sleep apnea (adult) (pediatric): G47.33

## 2018-04-18 NOTE — Assessment & Plan Note (Signed)
-   Continue Symbicort  

## 2018-04-18 NOTE — Assessment & Plan Note (Signed)
Will order CPAP for patient to start on >>>5-15 APAP  Keep up the hard work using your device.   Remember:  . Do not drive or operate heavy machinery if tired or drowsy.  . Please notify the supply company and office if you are unable to use your device regularly due to missing supplies or machine being broken.  . Work on maintaining a healthy weight and following your recommended nutrition plan  . Maintain proper daily exercise and movement  . Maintaining proper use of your device can also help improve management of other chronic illnesses such as: Blood pressure, blood sugars, and weight management.   CPAP Cleaning:  Clean weekly, with Dawn soap, and bottle brush.  Set up to air dry.

## 2018-04-18 NOTE — Assessment & Plan Note (Signed)
We will get started on CPAP  Suspect patient will need over now and oxygen with CPAP based off of nocturnal hypoxemia

## 2018-04-18 NOTE — Assessment & Plan Note (Signed)
Continue not smoking 

## 2018-04-18 NOTE — Assessment & Plan Note (Signed)
Flu shot at next office visit Pneumovax at next office visit

## 2018-04-18 NOTE — Patient Instructions (Addendum)
Referral for primary care   Needs appt with cardiology - Dr. Johney Frame >>>do not wait till October/2019 >>>for blood pressure     Follow up 8 weeks for compliance of CPAP use   Continue Symbicort 160  >>> 2 puffs in the morning right when you wake up, rinse out your mouth after use, 12 hours later 2 puffs, rinse after use >>> Take this daily, no matter what >>> This is not a rescue inhaler      Please contact the office if your symptoms worsen or you have concerns that you are not improving.   Thank you for choosing [facility] for your healthcare, and for allowing [place] to partner with you on your healthcare journey. I am thankful to be able to provide care to you today.   Elisha Headland FNP-C   CPAP and BiPAP Information CPAP and BiPAP are methods of helping a person breathe with the use of air pressure. CPAP stands for "continuous positive airway pressure." BiPAP stands for "bi-level positive airway pressure." In both methods, air is blown through your nose or mouth and into your air passages to help you breathe well. CPAP and BiPAP use different amounts of pressure to blow air. With CPAP, the amount of pressure stays the same while you breathe in and out. With BiPAP, the amount of pressure is increased when you breathe in (inhale) so that you can take larger breaths. Your health care provider will recommend whether CPAP or BiPAP would be more helpful for you. Why are CPAP and BiPAP treatments used? CPAP or BiPAP can be helpful if you have:  Sleep apnea.  Chronic obstructive pulmonary disease (COPD).  Heart failure.  Medical conditions that weaken the muscles of the chest including muscular dystrophy, or neurological diseases such as amyotrophic lateral sclerosis (ALS).  Other problems that cause breathing to be weak, abnormal, or difficult.  CPAP is most commonly used for obstructive sleep apnea (OSA) to keep the airways from collapsing when the muscles relax during  sleep. How is CPAP or BiPAP administered? Both CPAP and BiPAP are provided by a small machine with a flexible plastic tube that attaches to a plastic mask. You wear the mask. Air is blown through the mask into your nose or mouth. The amount of pressure that is used to blow the air can be adjusted on the machine. Your health care provider will determine the pressure setting that should be used based on your individual needs. When should CPAP or BiPAP be used? In most cases, the mask only needs to be worn during sleep. Generally, the mask needs to be worn throughout the night and during any daytime naps. People with certain medical conditions may also need to wear the mask at other times when they are awake. Follow instructions from your health care provider about when to use the machine. What are some tips for using the mask?  Because the mask needs to be snug, some people feel trapped or closed-in (claustrophobic) when first using the mask. If you feel this way, you may need to get used to the mask. One way to do this is by holding the mask loosely over your nose or mouth and then gradually applying the mask more snugly. You can also gradually increase the amount of time that you use the mask.  Masks are available in various types and sizes. Some fit over your mouth and nose while others fit over just your nose. If your mask does not fit well,  talk with your health care provider about getting a different one.  If you are using a mask that fits over your nose and you tend to breathe through your mouth, a chin strap may be applied to help keep your mouth closed.  The CPAP and BiPAP machines have alarms that may sound if the mask comes off or develops a leak.  If you have trouble with the mask, it is very important that you talk with your health care provider about finding a way to make the mask easier to tolerate. Do not stop using the mask. Stopping the use of the mask could have a negative impact on  your health. What are some tips for using the machine?  Place your CPAP or BiPAP machine on a secure table or stand near an electrical outlet.  Know where the on/off switch is located on the machine.  Follow instructions from your health care provider about how to set the pressure on your machine and when you should use it.  Do not eat or drink while the CPAP or BiPAP machine is on. Food or fluids could get pushed into your lungs by the pressure of the CPAP or BiPAP.  Do not smoke. Tobacco smoke residue can damage the machine.  For home use, CPAP and BiPAP machines can be rented or purchased through home health care companies. Many different brands of machines are available. Renting a machine before purchasing may help you find out which particular machine works well for you.  Keep the CPAP or BiPAP machine and attachments clean. Ask your health care provider for specific instructions. Get help right away if:  You have redness or open areas around your nose or mouth where the mask fits.  You have trouble using the CPAP or BiPAP machine.  You cannot tolerate wearing the CPAP or BiPAP mask.  You have pain, discomfort, and bloating in your abdomen. Summary  CPAP and BiPAP are methods of helping a person breathe with the use of air pressure.  Both CPAP and BiPAP are provided by a small machine with a flexible plastic tube that attaches to a plastic mask.  If you have trouble with the mask, it is very important that you talk with your health care provider about finding a way to make the mask easier to tolerate. This information is not intended to replace advice given to you by your health care provider. Make sure you discuss any questions you have with your health care provider. Document Released: 05/25/2004 Document Revised: 07/16/2016 Document Reviewed: 07/16/2016 Elsevier Interactive Patient Education  2017 [employer].

## 2018-04-22 ENCOUNTER — Telehealth: Payer: Self-pay | Admitting: Cardiology

## 2018-04-22 NOTE — Telephone Encounter (Signed)
Patient left a voicemail on ICD support group line. Called pt back. LMOVM for pt to return call to my direct line.

## 2018-04-25 ENCOUNTER — Encounter: Payer: Self-pay | Admitting: General Practice

## 2018-04-27 ENCOUNTER — Other Ambulatory Visit: Payer: Self-pay | Admitting: Cardiology

## 2018-04-28 NOTE — Telephone Encounter (Signed)
Rx sent to pharmacy   

## 2018-04-29 NOTE — Progress Notes (Signed)
Reviewed, agree 

## 2018-04-30 ENCOUNTER — Telehealth: Payer: Self-pay | Admitting: Pulmonary Disease

## 2018-04-30 DIAGNOSIS — G4733 Obstructive sleep apnea (adult) (pediatric): Secondary | ICD-10-CM

## 2018-04-30 NOTE — Telephone Encounter (Signed)
Spoke with pt. States that he wants to have his DME changed. Pt is currently with [facility] but he is not pleased with their customer service. Order has been placed to have pt's DME changed for his CPAP. Nothing further was needed.

## 2018-06-13 ENCOUNTER — Other Ambulatory Visit: Payer: Self-pay | Admitting: Cardiology

## 2018-06-16 ENCOUNTER — Ambulatory Visit (INDEPENDENT_AMBULATORY_CARE_PROVIDER_SITE_OTHER): Payer: Self-pay | Admitting: Pulmonary Disease

## 2018-06-16 ENCOUNTER — Encounter: Payer: Self-pay | Admitting: Pulmonary Disease

## 2018-06-16 VITALS — BP 130/64 | HR 80 | Ht 68.5 in | Wt 162.0 lb

## 2018-06-16 DIAGNOSIS — G4733 Obstructive sleep apnea (adult) (pediatric): Secondary | ICD-10-CM

## 2018-06-16 DIAGNOSIS — Z0000 Encounter for general adult medical examination without abnormal findings: Secondary | ICD-10-CM

## 2018-06-16 DIAGNOSIS — J449 Chronic obstructive pulmonary disease, unspecified: Secondary | ICD-10-CM

## 2018-06-16 MED ORDER — BUDESONIDE-FORMOTEROL FUMARATE 160-4.5 MCG/ACT IN AERO: 2.0000 | INHALATION_SPRAY | Freq: Two times a day (BID) | RESPIRATORY_TRACT | 0 refills | Status: DC

## 2018-06-16 NOTE — Assessment & Plan Note (Signed)
Refused flu vaccine today  Patient to contact PCP to contact him today so he can be scheduled for primary care visit and establish with PCP

## 2018-06-16 NOTE — Assessment & Plan Note (Signed)
Biotene mouthwash  >>> could help with dry mouth   Will add humidified air to CPAP   We recommend that you continue using your CPAP daily >>>Keep up the hard work using your device >>> Goal should be wearing this for the entire night that you are sleeping, at least 4 to 6 hours  Remember:  . Do not drive or operate heavy machinery if tired or drowsy.  . Please notify the supply company and office if you are unable to use your device regularly due to missing supplies or machine being broken.  . Work on maintaining a healthy weight and following your recommended nutrition plan  . Maintain proper daily exercise and movement  . Maintaining proper use of your device can also help improve management of other chronic illnesses such as: Blood pressure, blood sugars, and weight management.   BiPAP/ CPAP Cleaning:  >>>Clean weekly, with Dawn soap, and bottle brush.  Set up to air dry.  Follow-up in 3 months

## 2018-06-16 NOTE — Patient Instructions (Addendum)
Needs PCP set up   Biotene mouthwash  >>> could help with dry mouth   Will add humidified air to CPAP   We recommend that you continue using your CPAP daily >>>Keep up the hard work using your device >>> Goal should be wearing this for the entire night that you are sleeping, at least 4 to 6 hours  Remember:  . Do not drive or operate heavy machinery if tired or drowsy.  . Please notify the supply company and office if you are unable to use your device regularly due to missing supplies or machine being broken.  . Work on maintaining a healthy weight and following your recommended nutrition plan  . Maintain proper daily exercise and movement  . Maintaining proper use of your device can also help improve management of other chronic illnesses such as: Blood pressure, blood sugars, and weight management.   BiPAP/ CPAP Cleaning:  >>>Clean weekly, with Dawn soap, and bottle brush.  Set up to air dry.   Continue Symbicort 160 >>> 2 puffs in the morning right when you wake up, rinse out your mouth after use, 12 hours later 2 puffs, rinse after use >>> Take this daily, no matter what >>> This is not a rescue inhaler    Follow-up in 3 months   It is flu season:   >>>Remember to be washing your hands regularly, using hand sanitizer, be careful to use around herself with has contact with people who are sick will increase her chances of getting sick yourself. >>> Best ways to protect herself from the flu: Receive the yearly flu vaccine, practice good hand hygiene washing with soap and also using hand sanitizer when available, eat a nutritious meals, get adequate rest, hydrate appropriately   Please contact the office if your symptoms worsen or you have concerns that you are not improving.   Thank you for choosing [facility] for your healthcare, and for allowing [place] to partner with you on your healthcare journey. I am thankful to be able to provide care to you today.   Elisha Headland  FNP-C

## 2018-06-16 NOTE — Assessment & Plan Note (Signed)
Continue Symbicort 160 >>> 2 puffs in the morning right when you wake up, rinse out your mouth after use, 12 hours later 2 puffs, rinse after use >>> Take this daily, no matter what >>> This is not a rescue inhaler   Follow-up in 3 months

## 2018-06-16 NOTE — Progress Notes (Signed)
@Patient  ID: [name], male    DOB: [birth year], 77 y.o.   MRN: [id number]  Chief Complaint  Patient presents with  . Follow-up    OSA / CPAP start    Referring provider: No ref. provider found  HPI:  77 year old male patient followed in our office for COPD, prev history of nocturnal hypoxemia (pt declined nocturnal oxygen, recently diagnosed obstructive sleep apnea (sleep study June/2019-AHI 7-hour).  PMH:  Smoker/ Smoking History: Former Smoker. Quit in 2014. 28 pack years. Maintenance:   Pt of: Dr. Kendrick Fries    06/16/2018  - Visit   77 year old male patient presenting today for follow-up appointment after patient started CPAP therapy.  Patient reports that he thinks this is going well.  Patient feels that his sleep is improved since starting CPAP therapy.  Patient does report that he has had increased dry mouth since starting CPAP therapy.  Patient also reports frustration as he has to wake up and turn off the device at 4 hours after using it.  We will clarify this with the patient today.  His patient is not supposed to be turning off the CPAP as he supposed to wear it for the entirety of his sleep.  With a goal of at least 4 to 6 hours nightly.  CPAP compliance report confirms this 27 and last 30 days used, 17 of those days greater than 4 hours, average usage 3 hours and 38 minutes, APAP settings 5-15, AHI 0.7.  Patient reports that he is been adherent to his Symbicort 160.  Patient has not needed to use a rescue inhaler recently.  Patient reports no complaints with his breathing at this time.  Patient still has not been, establish with a primary care.  Patient reports that a primary care office called in today to get him scheduled to be seen off of [employer].  Patient reports that he will contact them so he can schedule appointment.    Chart Review:   Tests:   06/11/16 - home sleep study shows AHI of 2.9 an hour with the lowest SaO2 of 79% with an average of 93%    03/08/2018-Home sleep study-AHI 7/HR, SaO2 low 81%  Imaging:  05/15/2016-chest x-ray-hyperexpansion with areas of scarring bilaterally 02/04/2016-CT maxillofacial with contrast- prominent soft tissue swelling, mucosal thickening of the right maxillary sinus and small mucous retention cyst or polyp to the left maxillary sinus, numerous large maxillary mandibular dental caries noted  Cardiac:  02/20/2016-echocardiogram-LV ejection fraction 45 to 50%, mild regurgitation in the mitral valve  FENO:  No results found for: NITRICOXIDE  PFT: PFT Results Latest Ref Rng & Units 07/17/2016  FVC-Pre L 2.29  FVC-Predicted Pre % 67  FVC-Post L 2.54  FVC-Predicted Post % 74  Pre FEV1/FVC % % 62  Post FEV1/FCV % % 64  FEV1-Pre L 1.42  FEV1-Predicted Pre % 55  DLCO UNC% % 58  DLCO COR %Predicted % 93    Imaging: No results found.     Specialty Problems      Pulmonary Problems   Dyspnea   COPD with asthma (HCC)    07/08/13 PFT: FEV1 1.74L (66% pred, 30% change with BD), mod obst with resp to bronchodilator, moderate decrease diffusion, air-trapping 11/2013 Simple spiro>> clear obstruction, FEV1 1.30 L (47% pred) - trial of symbicort 160 2bid 01/26/15        Pneumonia   Pulmonary edema   Respiratory arrest (HCC)   Nocturnal hypoxemia    06/2016 O2 saturation nadir 79%  on RA 11/2/107 O2 saturation on RA, below 88% 29.6 minutes, longest was 7.9 minutes November 2017 refused further oxygen based on symptoms of sinus congestion, refused treatment for the same      Allergic rhinitis   COPD with acute exacerbation (HCC)   OSA (obstructive sleep apnea)    06/11/16 - home sleep study shows AHI of 2.9 an hour with the lowest SaO2 of 79% with an average of 93%  03/08/2018-Home sleep study-AHI 7/HR, SaO2 low 81%          Allergies  Allergen Reactions  . Acyclovir And Related     unknown  . Aspirin Other (See Comments)    GI upset at high doses only.    Immunization History   Administered Date(s) Administered  . Influenza,inj,Quad PF,6+ Mos 06/10/2014  . Pneumococcal Conjugate-13 11/12/2013  . Tdap 12/13/2017   >>> Patient refused flu vaccine today patient would not like Pneumovax 23 today  Past Medical History:  Diagnosis Date  . CAD (coronary artery disease)    a. s/p stent RCA DES 9/09; b. 2014 Attempted PCI of OM1 @ [facility];  c. 04/2014 Cath: LAD 40-50p, D1 95-99 (chronic), LCX 30-40 inf branch, OM1 CTO, RCA 30-40p, RCA patent stent, EF 35%->Med Rx.  . Chronic combined systolic and diastolic CHF (congestive heart failure) (HCC)    a. EF about 40 to 45% per echo in April 2013;  b. 04/2014 Echo: EF 20-25%, sev LVH, sev glob HK, Gr 1 DD, mildly reduced RV fxn, PASP . c. 01/2017: EF improved to 50-55%.   . Chronic low back pain   . COPD (chronic obstructive pulmonary disease) (HCC) dx 06/2013   PFTs 07/08/13: mod obst with resp to bronchodilator, moderate decrease diffusion, airtrapping  . Dyslipidemia    a. on statin  . HLD (hyperlipidemia)   . HTN (hypertension)    a. Reports intolerance to hydralazine; b. no beta blockers 2/2 bradycardia;  c. failed on ACE and ARB.  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 LBBB (left bundle branch block)   . LVH (left ventricular hypertrophy)   . Mixed Ischemic/Non-ischemic Cardiomyopathy    a. 04/2014 Echo: EF 20-25%, sev glob HK.  [name] Noncompliance   . NSVT (nonsustained ventricular tachycardia) (HCC)   . Paroxysmal atrial fibrillation (HCC)    a. identified on device interrogation 01/2016    Tobacco History: Social History   Tobacco Use  Smoking Status Former Smoker  . Packs/day: 0.50  . Years: 56.00  . Pack years: 28.00  . Types: Cigarettes  . Last attempt to quit: 09/10/2012  . Years since quitting: 5.7  Smokeless Tobacco Never Used  Tobacco Comment   tried 1 month ago (05/2018) but didn't like it    Counseling given: Yes Comment: tried 1 month ago (05/2018) but didn't like it   Continue to  not smoke  Outpatient Encounter Medications as of 06/16/2018  Medication Sig  . albuterol (PROVENTIL HFA;VENTOLIN HFA) 108 (90 Base) MCG/ACT inhaler Inhale 1 puff into the lungs every 6 (six) hours as needed for wheezing or shortness of breath.  [name] amLODipine (NORVASC) 5 MG tablet Take three tablets by mouth every day take two tabs in the am and one tab in the evening. Please call for further refills  . amLODipine (NORVASC) 5 MG tablet TAKE 3 TABLETS BY MOUTH EVERY DAY TAKE 2 TABS IN THE AM AND 1 TAB IN THE EVENING  . budesonide-formoterol (SYMBICORT) 160-4.5 MCG/ACT inhaler TAKE 2 PUFFS BY MOUTH TWICE  A DAY  . clopidogrel (PLAVIX) 75 MG tablet TAKE 1 TABLET BY MOUTH EVERY DAY  . albuterol (PROVENTIL) (2.5 MG/3ML) 0.083% nebulizer solution Take 3 mLs (2.5 mg total) by nebulization every 6 (six) hours as needed for wheezing or shortness of breath. (Patient not taking: Reported on 06/16/2018)  . atorvastatin (LIPITOR) 20 MG tablet TAKE 1 TABLET EVERY DAY (Patient not taking: Reported on 06/16/2018)  . budesonide-formoterol (SYMBICORT) 160-4.5 MCG/ACT inhaler Inhale 2 puffs into the lungs 2 (two) times daily.  . isosorbide mononitrate (IMDUR) 60 MG 24 hr tablet TAKE 1 TABLET (60 MG TOTAL) BY MOUTH DAILY. (Patient not taking: Reported on 06/16/2018)  . KLOR-CON M20 20 MEQ tablet TAKE 2 TABLETS (40 MEQ TOTAL) BY MOUTH DAILY. (Patient not taking: Reported on 06/16/2018)  . losartan (COZAAR) 100 MG tablet Take 1 tablet (100 mg total) by mouth daily. (Patient not taking: Reported on 06/16/2018)  . nitroGLYCERIN (NITROSTAT) 0.4 MG SL tablet Place 1 tablet (0.4 mg total) under the tongue every 5 (five) minutes as needed for chest pain.  . traMADol (ULTRAM) 50 MG tablet Take 1 tablet (50 mg total) by mouth every 6 (six) hours as needed. (Patient not taking: Reported on 06/16/2018)   No facility-administered encounter medications on file as of 06/16/2018.      Review of Systems  Review of Systems    Constitutional: Positive for fatigue (Chronic but feels that daytime fatigue has improved with CPAP use). Negative for activity change, chills, fever and unexpected weight change.  HENT: Negative for postnasal drip, rhinorrhea, sinus pressure, sinus pain, sneezing and sore throat.        Dry mouth with CPAP use  Eyes: Negative.   Respiratory: Negative for cough, shortness of breath and wheezing.   Cardiovascular: Negative for chest pain and palpitations.  Gastrointestinal: Negative for constipation, diarrhea, nausea and vomiting.       Denies indigestion or heartburn  Endocrine: Negative.   Musculoskeletal: Negative.   Skin: Negative.   Neurological: Negative for dizziness and headaches (Denies morning headache).  Psychiatric/Behavioral: Negative.  Negative for dysphoric mood. The patient is not nervous/anxious.   All other systems reviewed and are negative.    Physical Exam  BP 130/64   Pulse 80   Ht 5' 8.5" (1.74 m)   Wt 162 lb (73.5 kg)   SpO2 100%   BMI 24.27 kg/m   Wt Readings from Last 5 Encounters:  06/16/18 162 lb (73.5 kg)  04/18/18 161 lb 12.8 oz (73.4 kg)  02/11/18 160 lb (72.6 kg)  12/13/17 160 lb (72.6 kg)  06/10/17 151 lb (68.5 kg)     Physical Exam  Constitutional: He is oriented to person, place, and time and well-developed, well-nourished, and in no distress. No distress.  HENT:  Head: Normocephalic and atraumatic.  Right Ear: Hearing, tympanic membrane, external ear and ear canal normal.  Left Ear: Hearing, tympanic membrane, external ear and ear canal normal.  Nose: Nose normal. Right sinus exhibits no maxillary sinus tenderness and no frontal sinus tenderness. Left sinus exhibits no maxillary sinus tenderness and no frontal sinus tenderness.  Mouth/Throat: Uvula is midline and oropharynx is clear and moist. No oropharyngeal exudate.  Eyes: Pupils are equal, round, and reactive to light.  Neck: Normal range of motion. Neck supple. No JVD present.   Cardiovascular: Normal rate, regular rhythm and normal heart sounds.  Pulmonary/Chest: Effort normal and breath sounds normal. No accessory muscle usage. No respiratory distress. He has no decreased breath sounds. He has  no wheezes. He has no rhonchi.  Musculoskeletal: Normal range of motion. He exhibits no edema.  Lymphadenopathy:    He has no cervical adenopathy.  Neurological: He is alert and oriented to person, place, and time. Gait normal.  Skin: Skin is warm and dry. He is not diaphoretic. No erythema.  Psychiatric: Mood, memory, affect and judgment normal.  Nursing note and vitals reviewed.     Lab Results:  CBC    Component Value Date/Time   WBC 9.4 02/04/2016 0503   RBC 3.86 (L) 02/04/2016 0503   HGB 10.7 (L) 02/04/2016 0503   HCT 34.3 (L) 02/04/2016 0503   PLT 218 02/04/2016 0503   MCV 88.9 02/04/2016 0503   MCH 27.7 02/04/2016 0503   MCHC 31.2 02/04/2016 0503   RDW 14.0 02/04/2016 0503   LYMPHSABS 1.5 02/03/2016 2220   MONOABS 1.1 (H) 02/03/2016 2220   EOSABS 0.0 02/03/2016 2220   BASOSABS 0.0 02/03/2016 2220    BMET    Component Value Date/Time   NA 140 02/11/2018 1728   K 3.8 02/11/2018 1728   CL 104 02/11/2018 1728   CO2 28 02/11/2018 1728   GLUCOSE 100 (H) 02/11/2018 1728   BUN 10 02/11/2018 1728   CREATININE 0.87 02/11/2018 1728   CREATININE 0.83 04/17/2016 0330   CALCIUM 9.3 02/11/2018 1728   GFRNONAA 88 04/17/2016 0330   GFRAA >89 04/17/2016 0330    BNP    Component Value Date/Time   BNP 95.4 04/17/2016 1055    ProBNP    Component Value Date/Time   PROBNP 218.0 (H) 01/26/2015 1143      Assessment & Plan:   Pleasant 77 year old patient seen for follow-up visit today.  Patient is doing well with CPAP therapy.  I discussed with the patient and cleared at the confusion that he is to wear the CPAP for the entirety of the night notches for 4 hours.  Patient agrees says that he will start doing that.  We will send order for DME to add  humidified air.  Patient can also use Biotene mouthwash to help with dry mouth.  Patient to continue Symbicort as prescribed.  Patient refused flu vaccine today.  Patient to establish with primary care office I contacted him today so that he can have a primary care provider.  Follow-up in 3 months  COPD with asthma (HCC) Continue Symbicort 160 >>> 2 puffs in the morning right when you wake up, rinse out your mouth after use, 12 hours later 2 puffs, rinse after use >>> Take this daily, no matter what >>> This is not a rescue inhaler   Follow-up in 3 months  OSA (obstructive sleep apnea) Biotene mouthwash  >>> could help with dry mouth   Will add humidified air to CPAP   We recommend that you continue using your CPAP daily >>>Keep up the hard work using your device >>> Goal should be wearing this for the entire night that you are sleeping, at least 4 to 6 hours  Remember:  . Do not drive or operate heavy machinery if tired or drowsy.  . Please notify the supply company and office if you are unable to use your device regularly due to missing supplies or machine being broken.  . Work on maintaining a healthy weight and following your recommended nutrition plan  . Maintain proper daily exercise and movement  . Maintaining proper use of your device can also help improve management of other chronic illnesses such as: Blood pressure, blood sugars,  and weight management.   BiPAP/ CPAP Cleaning:  >>>Clean weekly, with Dawn soap, and bottle brush.  Set up to air dry.  Follow-up in 3 months  Healthcare maintenance Refused flu vaccine today  Patient to contact PCP to contact him today so he can be scheduled for primary care visit and establish with PCP     Coral Ceo, NP 06/16/2018

## 2018-06-19 NOTE — Progress Notes (Signed)
Electrophysiology Office Note Date: 06/20/2018  ID:  [name], DOB December 06, 1940, MRN [id number]  PCP: Patient, No Pcp Per Primary Cardiologist: [place] Electrophysiologist: Allred  CC: Routine ICD follow-up  [name] is a 77 y.o. male seen today for Dr Johney Frame.  He presents today for routine electrophysiology followup.  Since last being seen in our clinic, the patient reports doing relatively well.  He has had problems with some increased shortness of breath and fatigue. He is no longer taking Metoprolol but does not know when he stopped this medication.  He smokes "when people get on his nerves".  He also has occasional dizzy spells that last for seconds and then stop.  He denies chest pain, PND, orthopnea, nausea, vomiting, syncope, edema, weight gain, or early satiety.  He has not had ICD shocks.   Device History: MDT CRTD implanted 2015 for ICM, chronic systolic heart failure History of appropriate therapy: No History of AAD therapy: No   Past Medical History:  Diagnosis Date  . CAD (coronary artery disease)    a. s/p stent RCA DES 9/09; b. 2014 Attempted PCI of OM1 @ [facility];  c. 04/2014 Cath: LAD 40-50p, D1 95-99 (chronic), LCX 30-40 inf branch, OM1 CTO, RCA 30-40p, RCA patent stent, EF 35%->Med Rx.  . Chronic combined systolic and diastolic CHF (congestive heart failure) (HCC)    a. EF about 40 to 45% per echo in April 2013;  b. 04/2014 Echo: EF 20-25%, sev LVH, sev glob HK, Gr 1 DD, mildly reduced RV fxn, PASP . c. 01/2017: EF improved to 50-55%.   . Chronic low back pain   . COPD (chronic obstructive pulmonary disease) (HCC) dx 06/2013   PFTs 07/08/13: mod obst with resp to bronchodilator, moderate decrease diffusion, airtrapping  . Dyslipidemia    a. on statin  . HLD (hyperlipidemia)   . HTN (hypertension)    a. Reports intolerance to hydralazine; b. no beta blockers 2/2 bradycardia;  c. failed on ACE and ARB.  [name] HTN (hypertension), malignant 05/08/2014   . LBBB (left bundle branch block)   . LVH (left ventricular hypertrophy)   . Mixed Ischemic/Non-ischemic Cardiomyopathy    a. 04/2014 Echo: EF 20-25%, sev glob HK.  [name] Noncompliance   . NSVT (nonsustained ventricular tachycardia) (HCC)   . Paroxysmal atrial fibrillation (HCC)    a. identified on device interrogation 01/2016   Past Surgical History:  Procedure Laterality Date  . BI-VENTRICULAR IMPLANTABLE CARDIOVERTER DEFIBRILLATOR N/A 08/12/2014   MDT Ovidio Kin XT CRTD implanted by Dr Johney Frame  . CARDIAC CATHETERIZATION     ejection fraction 50%  . LEFT HEART CATHETERIZATION WITH CORONARY ANGIOGRAM N/A 05/08/2014   Procedure: LEFT HEART CATHETERIZATION WITH CORONARY ANGIOGRAM;  Surgeon: Micheline Chapman, MD;  [facility] LAB;  Service: Cardiovascular;  Laterality: N/A;    Current Outpatient Medications  Medication Sig Dispense Refill  . albuterol (PROVENTIL HFA;VENTOLIN HFA) 108 (90 Base) MCG/ACT inhaler Inhale 1 puff into the lungs every 6 (six) hours as needed for wheezing or shortness of breath. 1 Inhaler 5  . albuterol (PROVENTIL) (2.5 MG/3ML) 0.083% nebulizer solution Take 3 mLs (2.5 mg total) by nebulization every 6 (six) hours as needed for wheezing or shortness of breath. 360 mL 11  . amLODipine (NORVASC) 5 MG tablet Take three tablets by mouth every day take two tabs in the am and one tab in the evening. Please call for further refills 90 tablet 0  . budesonide-formoterol (SYMBICORT) 160-4.5 MCG/ACT  inhaler TAKE 2 PUFFS BY MOUTH TWICE A DAY 1 Inhaler 6  . clopidogrel (PLAVIX) 75 MG tablet TAKE 1 TABLET BY MOUTH EVERY DAY 30 tablet 6  . isosorbide mononitrate (IMDUR) 60 MG 24 hr tablet TAKE 1 TABLET (60 MG TOTAL) BY MOUTH DAILY. 90 tablet 2  . losartan (COZAAR) 100 MG tablet Take 1 tablet (100 mg total) by mouth daily. 90 tablet 3  . nitroGLYCERIN (NITROSTAT) 0.4 MG SL tablet Place 1 tablet (0.4 mg total) under the tongue every 5 (five) minutes as needed for chest pain. 25 tablet  3  . potassium chloride SA (K-DUR,KLOR-CON) 20 MEQ tablet Take 40 mEq by mouth daily.    . traMADol (ULTRAM) 50 MG tablet Take 1 tablet (50 mg total) by mouth every 6 (six) hours as needed. 10 tablet 0  . atorvastatin (LIPITOR) 20 MG tablet Take 1 tablet (20 mg total) by mouth daily. 90 tablet 2  . metoprolol tartrate (LOPRESSOR) 50 MG tablet Take 1 tablet (50 mg total) by mouth 2 (two) times daily. 180 tablet 3   No current facility-administered medications for this visit.     Allergies:   Acyclovir and related and Aspirin   Social History: Social History   Socioeconomic History  . Marital status: Married    Spouse name: Not on file  . Number of children: 3  . Years of education: Not on file  . Highest education level: Not on file  Occupational History  . Occupation: Electrical engineer.  Social Needs  . Financial resource strain: Not on file  . Food insecurity:    Worry: Not on file    Inability: Not on file  . Transportation needs:    Medical: Not on file    Non-medical: Not on file  Tobacco Use  . Smoking status: Former Smoker    Packs/day: 0.50    Years: 56.00    Pack years: 28.00    Types: Cigarettes    Last attempt to quit: 09/10/2012    Years since quitting: 5.7  . Smokeless tobacco: Never Used  . Tobacco comment: tried 1 month ago (05/2018) but didn't like it   Substance and Sexual Activity  . Alcohol use: No  . Drug use: No  . Sexual activity: Not Currently  Lifestyle  . Physical activity:    Days per week: Not on file    Minutes per session: Not on file  . Stress: Not on file  Relationships  . Social connections:    Talks on phone: Not on file    Gets together: Not on file    Attends religious service: Not on file    Active member of club or organization: Not on file    Attends meetings of clubs or organizations: Not on file    Relationship status: Not on file  . Intimate partner violence:    Fear of current or ex partner: Not on file    Emotionally  abused: Not on file    Physically abused: Not on file    Forced sexual activity: Not on file  Other Topics Concern  . Not on file  Social History Narrative           Family History: Family History  Problem Relation Age of Onset  . Heart attack Neg Hx   . Stroke Neg Hx     Review of Systems: All other systems reviewed and are otherwise negative except as noted above.   Physical Exam: VS:  BP (!) 152/80  Pulse 64   Ht 5' 8.5" (1.74 m)   Wt 158 lb (71.7 kg)   BMI 23.67 kg/m  , BMI Body mass index is 23.67 kg/m.  GEN- The patient is well appearing, alert and oriented x 3 today.   HEENT: normocephalic, atraumatic; sclera clear, conjunctiva pink; hearing intact; oropharynx clear; neck supple  Lungs- Clear to ausculation bilaterally, normal work of breathing.  No wheezes, rales, rhonchi Heart- Regular rate and rhythm (paced) GI- soft, non-tender, non-distended, bowel sounds present Extremities- no clubbing, cyanosis, or edema  MS- no significant deformity or atrophy Skin- warm and dry, no rash or lesion; ICD pocket well healed Psych- euthymic mood, full affect Neuro- strength and sensation are intact  ICD interrogation- reviewed in detail today,  See PACEART report  EKG:  EKG is not ordered today.  Recent Labs: 02/11/2018: ALT 9; BUN 10; Creatinine, Ser 0.87; Potassium 3.8; Sodium 140   Wt Readings from Last 3 Encounters:  06/20/18 158 lb (71.7 kg)  06/16/18 162 lb (73.5 kg)  04/18/18 161 lb 12.8 oz (73.4 kg)     Other studies Reviewed: Additional studies/ records that were reviewed today include: Dr Jenel Lucks office notes   Assessment and Plan:  1.  Chronic systolic dysfunction euvolemic today Normal ICD function See Pace Art report No changes today  2.  HTN BP elevated today He has not taken Losartan  3.  COPD Followed by pulmonary Shortness of breath at baseline  4.  Paroxysmal atrial fibrillation/atrial tachycardia AF burden by device interrogation  <1% Mostly FFRW since last year Will follow Will need to consider OAC for AF episodes >24 hours  5.  OSA Complaint with CPAP  6.  VT 3 episodes of VT treated with ATP since last office visit.  All episodes in VF zone and terminated with ATP No programming changes made today Restart Metoprolol 50mg  twice daily Update echo - if EF depressed from previous, consider L/RHC to evaluate further. Activity level also decreased on device interrogation BMET, CBC, Mg today    Current medicines are reviewed at length with the patient today.   The patient does not have concerns regarding his medicines.  The following changes were made today:  Start Metoprolol 50mg  twice daily  Labs/ tests ordered today include:  Orders Placed This Encounter  Procedures  . Basic Metabolic Panel (BMET)  . CBC  . Magnesium  . CUP PACEART INCLINIC DEVICE CHECK  . ECHOCARDIOGRAM COMPLETE     Disposition:   Follow up with Dr [place] 2-3 weeks to follow up on echo results, me in 3 months, [facility]      Signed, Gypsy Balsam, NP 06/20/2018 8:48 AM  [facility] [address] [place] [address] [phone] (office) [phone] (fax)

## 2018-06-20 ENCOUNTER — Ambulatory Visit (INDEPENDENT_AMBULATORY_CARE_PROVIDER_SITE_OTHER): Payer: Self-pay | Admitting: Nurse Practitioner

## 2018-06-20 VITALS — BP 152/80 | HR 64 | Ht 68.5 in | Wt 158.0 lb

## 2018-06-20 DIAGNOSIS — I472 Ventricular tachycardia, unspecified: Secondary | ICD-10-CM

## 2018-06-20 DIAGNOSIS — I480 Paroxysmal atrial fibrillation: Secondary | ICD-10-CM

## 2018-06-20 DIAGNOSIS — G4733 Obstructive sleep apnea (adult) (pediatric): Secondary | ICD-10-CM

## 2018-06-20 DIAGNOSIS — I5042 Chronic combined systolic (congestive) and diastolic (congestive) heart failure: Secondary | ICD-10-CM

## 2018-06-20 DIAGNOSIS — I10 Essential (primary) hypertension: Secondary | ICD-10-CM

## 2018-06-20 DIAGNOSIS — I255 Ischemic cardiomyopathy: Secondary | ICD-10-CM

## 2018-06-20 LAB — BASIC METABOLIC PANEL
BUN / CREAT RATIO: 9 — AB (ref 10–24)
BUN: 8 mg/dL (ref 8–27)
CO2: 24 mmol/L (ref 20–29)
CREATININE: 0.91 mg/dL (ref 0.76–1.27)
Calcium: 9.5 mg/dL (ref 8.6–10.2)
Chloride: 103 mmol/L (ref 96–106)
GFR calc Af Amer: 96 mL/min/{1.73_m2} (ref >59)
GFR calc non Af Amer: 83 mL/min/{1.73_m2} (ref >59)
Glucose: 87 mg/dL (ref 65–99)
POTASSIUM: 3.9 mmol/L (ref 3.5–5.2)
SODIUM: 143 mmol/L (ref 134–144)

## 2018-06-20 LAB — CBC
Hematocrit: 38.9 % (ref 37.5–51.0)
Hemoglobin: 13 g/dL (ref 13.0–17.7)
MCH: 28.2 pg (ref 26.6–33.0)
MCHC: 33.4 g/dL (ref 31.5–35.7)
MCV: 84 fL (ref 79–97)
PLATELETS: 203 10*3/uL (ref 150–450)
RBC: 4.61 x10E6/uL (ref 4.14–5.80)
RDW: 14.2 % (ref 12.3–15.4)
WBC: 5.9 10*3/uL (ref 3.4–10.8)

## 2018-06-20 LAB — CUP PACEART INCLINIC DEVICE CHECK
Implantable Lead Implant Date: 20151203
Implantable Lead Implant Date: 20151203
Implantable Lead Location: 753860
Implantable Lead Model: 4598
Implantable Lead Model: 5076
Implantable Lead Model: 6935
MDC IDC LEAD IMPLANT DT: 20151203
MDC IDC LEAD LOCATION: 753858
MDC IDC LEAD LOCATION: 753859
MDC IDC PG IMPLANT DT: 20151203
MDC IDC SESS DTM: 20191011080723

## 2018-06-20 LAB — MAGNESIUM: Magnesium: 2.1 mg/dL (ref 1.6–2.3)

## 2018-06-20 MED ORDER — AMLODIPINE BESYLATE 5 MG PO TABS: ORAL_TABLET | ORAL | 0 refills | Status: DC

## 2018-06-20 MED ORDER — METOPROLOL TARTRATE 50 MG PO TABS: 50.0000 mg | ORAL_TABLET | Freq: Two times a day (BID) | ORAL | 3 refills | Status: DC

## 2018-06-20 MED ORDER — ATORVASTATIN CALCIUM 20 MG PO TABS: 20.0000 mg | ORAL_TABLET | Freq: Every day | ORAL | 2 refills | Status: DC

## 2018-06-20 NOTE — Progress Notes (Signed)
Reviewed, agree 

## 2018-06-20 NOTE — Patient Instructions (Addendum)
Medication Instructions: Miralax for Constipation  Metoprolol Tartrate 50 mg twice per day    -- If you need a refill on your cardiac medications before your next appointment, please call your pharmacy. --  Labwork:TODAY BMET CBC MAGNESIUM  Testing/Procedures: Please schedule  Your physician has requested that you have an echocardiogram. Echocardiography is a painless test that uses sound waves to create images of your heart. It provides your doctor with information about the size and shape of your heart and how well your heart's chambers and valves are working. This procedure takes approximately one hour. There are no restrictions for this procedure.    Follow-Up: Remote monitoring is used to monitor your ICD from home. This monitoring reduces the number of office visits required to check your device to one time per year. It allows [place] to keep an eye on the functioning of your device to ensure it is working properly. You are scheduled for a device check from home on 07/07/18. You may send your transmission at any time that day. If you have a wireless device, the transmission will be sent automatically. After your physician reviews your transmission, you will receive a postcard with your next transmission date.   Your physician wants you to follow-up in: 2 weeks with Dr [place]   3 months with Gypsy Balsam   Thank you for choosing [facility]!!    Any Other Special Instructions Will Be Listed Below (If Applicable).

## 2018-06-23 ENCOUNTER — Telehealth: Payer: Self-pay

## 2018-06-23 NOTE — Telephone Encounter (Signed)
Notes recorded by Sigurd Sos, RN on 06/23/2018 at 8:03 AM EDT [id number] 10/14

## 2018-06-23 NOTE — Telephone Encounter (Signed)
-----   Message from Amber K Seiler, NP sent at 06/22/2018  8:06 PM EDT ----- Please notify patient of stable labs. Thanks! 

## 2018-06-24 ENCOUNTER — Telehealth: Payer: Self-pay

## 2018-06-24 NOTE — Telephone Encounter (Signed)
Informed patient of lab results. He verbalized understanding.

## 2018-07-04 ENCOUNTER — Other Ambulatory Visit (HOSPITAL_COMMUNITY): Payer: Self-pay

## 2018-07-07 ENCOUNTER — Ambulatory Visit (INDEPENDENT_AMBULATORY_CARE_PROVIDER_SITE_OTHER): Payer: Self-pay | Admitting: *Deleted

## 2018-07-07 DIAGNOSIS — I472 Ventricular tachycardia, unspecified: Secondary | ICD-10-CM

## 2018-07-07 DIAGNOSIS — I5042 Chronic combined systolic (congestive) and diastolic (congestive) heart failure: Secondary | ICD-10-CM

## 2018-07-08 NOTE — Progress Notes (Signed)
Remote ICD transmission.   

## 2018-07-15 ENCOUNTER — Ambulatory Visit: Payer: Self-pay | Admitting: Cardiology

## 2018-07-15 ENCOUNTER — Encounter: Payer: Self-pay | Admitting: Nurse Practitioner

## 2018-07-21 ENCOUNTER — Telehealth: Payer: Self-pay

## 2018-07-21 NOTE — Telephone Encounter (Signed)
New message    Just an FYI. We have made several attempts to contact this patient including sending a letter to schedule or reschedule their echocardiogram. We will be removing the patient from the echo WQ.   Thank you 

## 2018-07-21 NOTE — Telephone Encounter (Signed)
Can you send this to the pts Primary Cardiologist to make them aware.

## 2018-07-22 ENCOUNTER — Other Ambulatory Visit: Payer: Self-pay | Admitting: Cardiology

## 2018-08-04 ENCOUNTER — Other Ambulatory Visit: Payer: Self-pay | Admitting: Nurse Practitioner

## 2018-08-04 ENCOUNTER — Ambulatory Visit (HOSPITAL_COMMUNITY): Payer: Self-pay | Attending: Internal Medicine

## 2018-08-04 DIAGNOSIS — I472 Ventricular tachycardia, unspecified: Secondary | ICD-10-CM

## 2018-09-01 LAB — CUP PACEART REMOTE DEVICE CHECK
Battery Voltage: 2.95 V
Brady Statistic AS VP Percent: 27.01 %
Brady Statistic RA Percent Paced: 69.11 %
Date Time Interrogation Session: 20191028052504
HIGH POWER IMPEDANCE MEASURED VALUE: 70 Ohm
Implantable Lead Implant Date: 20151203
Implantable Lead Implant Date: 20151203
Implantable Lead Location: 753858
Implantable Lead Model: 6935
Lead Channel Impedance Value: 304 Ohm
Lead Channel Impedance Value: 323 Ohm
Lead Channel Impedance Value: 380 Ohm
Lead Channel Impedance Value: 513 Ohm
Lead Channel Impedance Value: 532 Ohm
Lead Channel Pacing Threshold Amplitude: 1 V
Lead Channel Pacing Threshold Pulse Width: 0.4 ms
Lead Channel Sensing Intrinsic Amplitude: 19.75 mV
Lead Channel Setting Pacing Amplitude: 2.25 V
Lead Channel Setting Pacing Pulse Width: 0.4 ms
Lead Channel Setting Pacing Pulse Width: 0.4 ms
MDC IDC LEAD IMPLANT DT: 20151203
MDC IDC LEAD LOCATION: 753859
MDC IDC LEAD LOCATION: 753860
MDC IDC MSMT BATTERY REMAINING LONGEVITY: 28 mo
MDC IDC MSMT LEADCHNL LV IMPEDANCE VALUE: 323 Ohm
MDC IDC MSMT LEADCHNL LV IMPEDANCE VALUE: 323 Ohm
MDC IDC MSMT LEADCHNL LV IMPEDANCE VALUE: 342 Ohm
MDC IDC MSMT LEADCHNL LV IMPEDANCE VALUE: 532 Ohm
MDC IDC MSMT LEADCHNL LV IMPEDANCE VALUE: 532 Ohm
MDC IDC MSMT LEADCHNL LV IMPEDANCE VALUE: 532 Ohm
MDC IDC MSMT LEADCHNL LV PACING THRESHOLD AMPLITUDE: 1.125 V
MDC IDC MSMT LEADCHNL LV PACING THRESHOLD PULSEWIDTH: 0.4 ms
MDC IDC MSMT LEADCHNL RA PACING THRESHOLD AMPLITUDE: 0.625 V
MDC IDC MSMT LEADCHNL RA PACING THRESHOLD PULSEWIDTH: 0.4 ms
MDC IDC MSMT LEADCHNL RA SENSING INTR AMPL: 0.75 mV
MDC IDC MSMT LEADCHNL RA SENSING INTR AMPL: 0.75 mV
MDC IDC MSMT LEADCHNL RV IMPEDANCE VALUE: 323 Ohm
MDC IDC MSMT LEADCHNL RV IMPEDANCE VALUE: 380 Ohm
MDC IDC MSMT LEADCHNL RV SENSING INTR AMPL: 19.75 mV
MDC IDC PG IMPLANT DT: 20151203
MDC IDC SET LEADCHNL RA PACING AMPLITUDE: 1.5 V
MDC IDC SET LEADCHNL RV PACING AMPLITUDE: 2.25 V
MDC IDC SET LEADCHNL RV SENSING SENSITIVITY: 0.3 mV
MDC IDC STAT BRADY AP VP PERCENT: 71.76 %
MDC IDC STAT BRADY AP VS PERCENT: 0.39 %
MDC IDC STAT BRADY AS VS PERCENT: 0.84 %
MDC IDC STAT BRADY RV PERCENT PACED: 95.71 %

## 2018-09-09 ENCOUNTER — Telehealth: Payer: Self-pay

## 2018-09-09 NOTE — Telephone Encounter (Signed)
-----   Message from Hillis Range, MD sent at 09/02/2018  4:45 PM EST ----- Results reviewed.  Boneta Lucks, please inform pt of result. Moderate LVH is now severe.  I will forward to Dr Swaziland who is listed as primary cardiologist also.

## 2018-09-14 NOTE — Progress Notes (Signed)
 @Patient  ID: Vincent DemarkGlenn A Brewer, male    DOB: [birth year], 78 y.o.   MRN: [id number]  Chief Complaint  Patient presents with  . Follow-up    COPD and OSA    Referring provider: No ref. provider found  HPI:  78 year old male patient followed in our office for COPD, prev history of nocturnal hypoxemia (pt declined nocturnal oxygen, recently diagnosed obstructive sleep apnea (sleep study June/2019-AHI 7-hour).  PMH: Ischemic cardiomyopathy, hypertension, CAD, CHF, allergic rhinitis Smoker/ Smoking History: Former Smoker. Quit in 2014. 28 pack years. Maintenance:   Pt of: Dr. Kendrick FriesMcQuaid   09/15/2018  - Visit   78 year old male former smoker presenting today for follow-up visit.  Patient managed in our office for COPD, nocturnal hypoxia and obstructive sleep apnea.    Patient was started on CPAP a few months ago.  Patient CPAP compliance report showing 9 of the 30 days use, all 9 of those days less than 4 hours, average usage 2 hours and 11 minutes, APAP settings 5-15, AHI 1.6  Patient reports he lost his job in December/2019.  Patient admits that this is affected his mood and that he has been depressed and eating more.  Patient reports compliance using his CPAP at least 3-4 times a week.  Patient does not have PCP set up.  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  MMRC - Breathlessness Score 1 - I get short of breath when hurrying on level ground or walking up a slight hill    Tests:   06/11/16 - home sleep study shows AHI of 2.9 an hour with the lowest SaO2 of 79% with an average of 93%  03/08/2018-Home sleep study-AHI 7/HR, SaO2 low 81%  Imaging:  05/15/2016-chest x-ray-hyperexpansion with areas of scarring bilaterally 02/04/2016-CT maxillofacial with contrast- prominent soft tissue swelling, mucosal thickening of the right maxillary sinus and small mucous retention cyst or polyp to  the left maxillary sinus, numerous large maxillary mandibular dental caries noted  Cardiac:  02/20/2016-echocardiogram-LV ejection fraction 45 to 50%, mild regurgitation in the mitral valve  FENO:  No results found for: NITRICOXIDE  PFT: PFT Results Latest Ref Rng & Units 07/17/2016  FVC-Pre L 2.29  FVC-Predicted Pre % 67  FVC-Post L 2.54  FVC-Predicted Post % 74  Pre FEV1/FVC % % 62  Post FEV1/FCV % % 64  FEV1-Pre L 1.42  FEV1-Predicted Pre % 55  FEV1-Post L 1.63  DLCO UNC% % 58  DLCO COR %Predicted % 93  TLC L 5.10  TLC % Predicted % 78  RV % Predicted % 112    Imaging: No results found.    Specialty Problems      Pulmonary Problems   Dyspnea   COPD with asthma (HCC)    07/08/13 PFT: FEV1 1.74L (66% pred, 30% change with BD), mod obst with resp to bronchodilator, moderate decrease diffusion, air-trapping 11/2013 Simple spiro>> clear obstruction, FEV1 1.30 L (47% pred) - trial of symbicort 160 2bid 01/26/15        Pneumonia   Pulmonary edema   Respiratory arrest (HCC)   Nocturnal hypoxemia    06/2016 O2 saturation nadir 79% on RA 11/2/107 O2 saturation on RA, below 88% 29.6 minutes, longest was 7.9 minutes November 2017 refused further oxygen based on symptoms of sinus congestion, refused treatment for the same      Allergic rhinitis   COPD with acute exacerbation (  HCC)   OSA (obstructive sleep apnea)    06/11/16 - home sleep study shows AHI of 2.9 an hour with the lowest SaO2 of 79% with an average of 93%  03/08/2018-Home sleep study-AHI 7/HR, SaO2 low 81%          Allergies  Allergen Reactions  . Acyclovir And Related     unknown  . Aspirin Other (See Comments)    GI upset at high doses only.    Immunization History  Administered Date(s) Administered  . Influenza,inj,Quad PF,6+ Mos 06/10/2014  . Pneumococcal Conjugate-13 11/12/2013  . Tdap 12/13/2017    Past Medical History:  Diagnosis Date  . CAD (coronary artery disease)    a. s/p stent  RCA DES 9/09; b. 2014 Attempted PCI of OM1 @ [facility];  c. 04/2014 Cath: LAD 40-50p, D1 95-99 (chronic), LCX 30-40 inf branch, OM1 CTO, RCA 30-40p, RCA patent stent, EF 35%->Med Rx.  . Chronic combined systolic and diastolic CHF (congestive heart failure) (HCC)    a. EF about 40 to 45% per echo in April 2013;  b. 04/2014 Echo: EF 20-25%, sev LVH, sev glob HK, Gr 1 DD, mildly reduced RV fxn, PASP . c. 01/2017: EF improved to 50-55%.   . Chronic low back pain   . COPD (chronic obstructive pulmonary disease) (HCC) dx 06/2013   PFTs 07/08/13: mod obst with resp to bronchodilator, moderate decrease diffusion, airtrapping  . Dyslipidemia    a. on statin  . HLD (hyperlipidemia)   . HTN (hypertension)    a. Reports intolerance to hydralazine; b. no beta blockers 2/2 bradycardia;  c. failed on ACE and ARB.  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 LBBB (left bundle branch block)   . LVH (left ventricular hypertrophy)   . Mixed Ischemic/Non-ischemic Cardiomyopathy    a. 04/2014 Echo: EF 20-25%, sev glob HK.  [name] Noncompliance   . NSVT (nonsustained ventricular tachycardia) (HCC)   . Paroxysmal atrial fibrillation (HCC)    a. identified on device interrogation 01/2016    Tobacco History: Social History   Tobacco Use  Smoking Status Former Smoker  . Packs/day: 0.50  . Years: 56.00  . Pack years: 28.00  . Types: Cigarettes  . Last attempt to quit: 09/10/2012  . Years since quitting: 6.0  Smokeless Tobacco Never Used  Tobacco Comment   tried 1 month ago (05/2018) but didn't like it    Counseling given: Not Answered Comment: tried 1 month ago (05/2018) but didn't like it   Continue to not smoke  Outpatient Encounter Medications as of 09/15/2018  Medication Sig  . albuterol (PROVENTIL HFA;VENTOLIN HFA) 108 (90 Base) MCG/ACT inhaler Inhale 1 puff into the lungs every 6 (six) hours as needed for wheezing or shortness of breath.  [name] amLODipine (NORVASC) 5 MG tablet Take three tablets by  mouth every day take two tabs in the am and one tab in the evening. Please call for further refills  . atorvastatin (LIPITOR) 20 MG tablet Take 1 tablet (20 mg total) by mouth daily.  . budesonide-formoterol (SYMBICORT) 160-4.5 MCG/ACT inhaler TAKE 2 PUFFS BY MOUTH TWICE A DAY  . clopidogrel (PLAVIX) 75 MG tablet TAKE 1 TABLET BY MOUTH EVERY DAY  . isosorbide mononitrate (IMDUR) 60 MG 24 hr tablet TAKE 1 TABLET (60 MG TOTAL) BY MOUTH DAILY.  [name] losartan (COZAAR) 100 MG tablet Take 1 tablet (100 mg total) by mouth daily.  . metoprolol tartrate (LOPRESSOR) 50 MG tablet Take 1 tablet (50 mg total) by  mouth 2 (two) times daily.  . potassium chloride SA (K-DUR,KLOR-CON) 20 MEQ tablet Take 40 mEq by mouth daily.  . traMADol (ULTRAM) 50 MG tablet Take 1 tablet (50 mg total) by mouth every 6 (six) hours as needed.  [name] albuterol (PROVENTIL) (2.5 MG/3ML) 0.083% nebulizer solution Take 3 mLs (2.5 mg total) by nebulization every 6 (six) hours as needed for wheezing or shortness of breath. (Patient not taking: Reported on 09/15/2018)  . nitroGLYCERIN (NITROSTAT) 0.4 MG SL tablet Place 1 tablet (0.4 mg total) under the tongue every 5 (five) minutes as needed for chest pain.   No facility-administered encounter medications on file as of 09/15/2018.      Review of Systems  Review of Systems  Constitutional: Positive for fatigue. Negative for chills and fever.  HENT: Positive for congestion. Negative for sinus pressure.   Respiratory: Positive for cough, shortness of breath and wheezing.   Cardiovascular: Negative for chest pain and palpitations.  Gastrointestinal: Negative for diarrhea, nausea and vomiting.  Musculoskeletal: Negative for arthralgias.  Psychiatric/Behavioral: Positive for dysphoric mood. The patient is nervous/anxious.      Physical Exam  BP (!) 148/68 (BP Location: Left Arm, Cuff Size: Normal)   Pulse 87   Ht 5\' 8"  (1.727 m)   Wt 157 lb 6.4 oz (71.4 kg)   SpO2 98%   BMI 23.93 kg/m    Wt Readings from Last 5 Encounters:  09/15/18 157 lb 6.4 oz (71.4 kg)  06/20/18 158 lb (71.7 kg)  06/16/18 162 lb (73.5 kg)  04/18/18 161 lb 12.8 oz (73.4 kg)  02/11/18 160 lb (72.6 kg)    Physical Exam  Constitutional: He is oriented to person, place, and time and well-developed, well-nourished, and in no distress. No distress.  HENT:  Head: Normocephalic and atraumatic.  Right Ear: Hearing, tympanic membrane, external ear and ear canal normal.  Left Ear: Hearing, tympanic membrane, external ear and ear canal normal.  Nose: Nose normal. Right sinus exhibits no maxillary sinus tenderness and no frontal sinus tenderness. Left sinus exhibits no maxillary sinus tenderness and no frontal sinus tenderness.  Mouth/Throat: Uvula is midline and oropharynx is clear and moist. No oropharyngeal exudate.  Eyes: Pupils are equal, round, and reactive to light.  Neck: Normal range of motion. Neck supple.  Cardiovascular: Normal rate, regular rhythm and normal heart sounds.  Pulmonary/Chest: Effort normal and breath sounds normal. No accessory muscle usage. No respiratory distress. He has no decreased breath sounds. He has no wheezes. He has no rhonchi.  Musculoskeletal: Normal range of motion.        General: No edema.  Lymphadenopathy:    He has no cervical adenopathy.  Neurological: He is alert and oriented to person, place, and time. Gait normal.  Skin: Skin is warm and dry. He is not diaphoretic. No erythema.  Psychiatric: Memory, affect and judgment normal. His mood appears anxious. He exhibits a depressed mood.  Nursing note and vitals reviewed.     Lab Results:  CBC    Component Value Date/Time   WBC 5.9 06/20/2018 0843   WBC 9.4 02/04/2016 0503   RBC 4.61 06/20/2018 0843   RBC 3.86 (L) 02/04/2016 0503   HGB 13.0 06/20/2018 0843   HCT 38.9 06/20/2018 0843   PLT 203 06/20/2018 0843   MCV 84 06/20/2018 0843   MCH 28.2 06/20/2018 0843   MCH 27.7 02/04/2016 0503   MCHC 33.4  06/20/2018 0843   MCHC 31.2 02/04/2016 0503   RDW 14.2 06/20/2018 0843  LYMPHSABS 1.5 02/03/2016 2220   MONOABS 1.1 (H) 02/03/2016 2220   EOSABS 0.0 02/03/2016 2220   BASOSABS 0.0 02/03/2016 2220    BMET    Component Value Date/Time   NA 143 06/20/2018 0843   K 3.9 06/20/2018 0843   CL 103 06/20/2018 0843   CO2 24 06/20/2018 0843   GLUCOSE 87 06/20/2018 0843   GLUCOSE 100 (H) 02/11/2018 1728   BUN 8 06/20/2018 0843   CREATININE 0.91 06/20/2018 0843   CREATININE 0.83 04/17/2016 0330   CALCIUM 9.5 06/20/2018 0843   GFRNONAA 83 06/20/2018 0843   GFRNONAA 88 04/17/2016 0330   GFRAA 96 06/20/2018 0843   GFRAA >89 04/17/2016 0330    BNP    Component Value Date/Time   BNP 95.4 04/17/2016 1055    ProBNP    Component Value Date/Time   PROBNP 218.0 (H) 01/26/2015 1143      Assessment & Plan:     OSA (obstructive sleep apnea) Assessment: Noncompliance on current CPAP regimen Probable depression Loss of job affecting compliance to CPAP  Plan: Resume CPAP use Patient agrees that he needs to use his CPAP Follow-up in 6 months, get patient started with community health and wellness  COPD with asthma (HCC) Assessment: Lungs clear to auscultation Good air movement on exam Remains adherent to Symbicort 160  Plan: Continue Symbicort 160 Samples of Symbicort 160 provided today Follow-up with our office in 6 months  Tobacco abuse Plan: Continue to not smoke If you to resume smoking please contact our office Review AVS information regarding smoking cessation resources to help you continue to not smoke  Healthcare maintenance Assessment: Recently lost his job Increased sadness and depression   Plan: Referral to community health and wellness for primary care as patient has lost income and potentially may lose insurance in the future   Coral CeoBrian P , NP 09/15/2018   This appointment was 35 minutes along with over 50% of the time in direct face-to-face  patient care, assessment, plan of care, and follow-up.

## 2018-09-15 ENCOUNTER — Ambulatory Visit (INDEPENDENT_AMBULATORY_CARE_PROVIDER_SITE_OTHER): Payer: Self-pay | Admitting: Pulmonary Disease

## 2018-09-15 ENCOUNTER — Encounter: Payer: Self-pay | Admitting: Pulmonary Disease

## 2018-09-15 VITALS — BP 148/68 | HR 87 | Ht 68.0 in | Wt 157.4 lb

## 2018-09-15 DIAGNOSIS — Z720 Tobacco use: Secondary | ICD-10-CM

## 2018-09-15 DIAGNOSIS — Z0000 Encounter for general adult medical examination without abnormal findings: Secondary | ICD-10-CM

## 2018-09-15 DIAGNOSIS — F32A Depression, unspecified: Secondary | ICD-10-CM | POA: Insufficient documentation

## 2018-09-15 DIAGNOSIS — F329 Major depressive disorder, single episode, unspecified: Secondary | ICD-10-CM

## 2018-09-15 DIAGNOSIS — J449 Chronic obstructive pulmonary disease, unspecified: Secondary | ICD-10-CM

## 2018-09-15 DIAGNOSIS — G4733 Obstructive sleep apnea (adult) (pediatric): Secondary | ICD-10-CM

## 2018-09-15 HISTORY — DX: Depression, unspecified: F32.A

## 2018-09-15 MED ORDER — BUDESONIDE-FORMOTEROL FUMARATE 160-4.5 MCG/ACT IN AERO: INHALATION_SPRAY | RESPIRATORY_TRACT | 0 refills | Status: DC

## 2018-09-15 NOTE — Assessment & Plan Note (Signed)
Assessment: Lungs clear to auscultation Good air movement on exam Remains adherent to Symbicort 160  Plan: Continue Symbicort 160 Samples of Symbicort 160 provided today Follow-up with our office in 6 months

## 2018-09-15 NOTE — Assessment & Plan Note (Addendum)
Assessment: Recently lost his job Increased sadness and depression   Plan: Referral to community health and wellness for primary care as patient has lost income and potentially may lose insurance in the future

## 2018-09-15 NOTE — Patient Instructions (Addendum)
Continue Symbicort 160 >>> 2 puffs in the morning right when you wake up, rinse out your mouth after use, 12 hours later 2 puffs, rinse after use >>> Take this daily, no matter what >>> This is not a rescue inhaler   Continue to not smoke  Smoking Cessation Resources:  1 800 QUIT NOW  >>> Patient to call this resource and utilize it to help support her quit smoking >>> Keep up your hard work with stopping smoking  You can also contact the [facility] >>>For smoking cessation classes call [phone]  We do not recommend using e-cigarettes as a form of stopping smoking  You can sign up for smoking cessation support texts and information:  >>>https://smokefree.gov/smokefreetxt    We recommend that you continue using your CPAP daily >>>Keep up the hard work using your device >>> Goal should be wearing this for the entire night that you are sleeping, at least 4 to 6 hours  Remember:  . Do not drive or operate heavy machinery if tired or drowsy.  . Please notify the supply company and office if you are unable to use your device regularly due to missing supplies or machine being broken.  . Work on maintaining a healthy weight and following your recommended nutrition plan  . Maintain proper daily exercise and movement  . Maintaining proper use of your device can also help improve management of other chronic illnesses such as: Blood pressure, blood sugars, and weight management.   BiPAP/ CPAP Cleaning:  >>>Clean weekly, with Dawn soap, and bottle brush.  Set up to air dry.      Financial Resources:  [employer] [phone]   Patient Financial Assistance [phone]  Local Resources:  [facility] health department [address] [place], [place] [address] [phone]  Social Security office [address] [facility] [address] [address] [place], [place] [address] [phone]    Follow up in 6 months       It is flu season:   >>>Remember to be  washing your hands regularly, using hand sanitizer, be careful to use around herself with has contact with people who are sick will increase her chances of getting sick yourself. >>> Best ways to protect herself from the flu: Receive the yearly flu vaccine, practice good hand hygiene washing with soap and also using hand sanitizer when available, eat a nutritious meals, get adequate rest, hydrate appropriately   Please contact the office if your symptoms worsen or you have concerns that you are not improving.   Thank you for choosing [facility] for your healthcare, and for allowing [place] to partner with you on your healthcare journey. I am thankful to be able to provide care to you today.   Elisha Headland FNP-C

## 2018-09-15 NOTE — Assessment & Plan Note (Signed)
Assessment: Noncompliance on current CPAP regimen Probable depression Loss of job affecting compliance to CPAP  Plan: Resume CPAP use Patient agrees that he needs to use his CPAP Follow-up in 6 months, get patient started with community health and wellness

## 2018-09-15 NOTE — Assessment & Plan Note (Signed)
Plan: Continue to not smoke If you to resume smoking please contact our office Review AVS information regarding smoking cessation resources to help you continue to not smoke

## 2018-09-16 NOTE — Progress Notes (Signed)
Reviewed, agree 

## 2018-10-06 ENCOUNTER — Ambulatory Visit (INDEPENDENT_AMBULATORY_CARE_PROVIDER_SITE_OTHER): Payer: Self-pay

## 2018-10-06 DIAGNOSIS — I472 Ventricular tachycardia, unspecified: Secondary | ICD-10-CM

## 2018-10-07 NOTE — Progress Notes (Signed)
Remote ICD transmission.   

## 2018-10-09 LAB — CUP PACEART REMOTE DEVICE CHECK
Battery Remaining Longevity: 25 mo
Battery Voltage: 2.93 V
Brady Statistic AP VP Percent: 67.67 %
Brady Statistic AP VS Percent: 0.33 %
Brady Statistic AS VP Percent: 30.93 %
Brady Statistic AS VS Percent: 1.06 %
HIGH POWER IMPEDANCE MEASURED VALUE: 64 Ohm
Implantable Lead Location: 753859
Implantable Lead Location: 753860
Implantable Lead Model: 5076
Implantable Pulse Generator Implant Date: 20151203
Lead Channel Impedance Value: 304 Ohm
Lead Channel Impedance Value: 304 Ohm
Lead Channel Impedance Value: 304 Ohm
Lead Channel Impedance Value: 323 Ohm
Lead Channel Impedance Value: 513 Ohm
Lead Channel Impedance Value: 513 Ohm
Lead Channel Impedance Value: 513 Ohm
Lead Channel Pacing Threshold Amplitude: 0.625 V
Lead Channel Pacing Threshold Amplitude: 1.25 V
Lead Channel Pacing Threshold Amplitude: 1.375 V
Lead Channel Pacing Threshold Pulse Width: 0.4 ms
Lead Channel Sensing Intrinsic Amplitude: 1.25 mV
Lead Channel Sensing Intrinsic Amplitude: 1.25 mV
Lead Channel Setting Pacing Amplitude: 2.5 V
Lead Channel Setting Sensing Sensitivity: 0.3 mV
MDC IDC LEAD IMPLANT DT: 20151203
MDC IDC LEAD IMPLANT DT: 20151203
MDC IDC LEAD IMPLANT DT: 20151203
MDC IDC LEAD LOCATION: 753858
MDC IDC MSMT LEADCHNL LV IMPEDANCE VALUE: 323 Ohm
MDC IDC MSMT LEADCHNL LV IMPEDANCE VALUE: 323 Ohm
MDC IDC MSMT LEADCHNL LV IMPEDANCE VALUE: 532 Ohm
MDC IDC MSMT LEADCHNL LV IMPEDANCE VALUE: 532 Ohm
MDC IDC MSMT LEADCHNL RA IMPEDANCE VALUE: 380 Ohm
MDC IDC MSMT LEADCHNL RA PACING THRESHOLD PULSEWIDTH: 0.4 ms
MDC IDC MSMT LEADCHNL RV IMPEDANCE VALUE: 323 Ohm
MDC IDC MSMT LEADCHNL RV PACING THRESHOLD PULSEWIDTH: 0.4 ms
MDC IDC MSMT LEADCHNL RV SENSING INTR AMPL: 20.625 mV
MDC IDC MSMT LEADCHNL RV SENSING INTR AMPL: 20.625 mV
MDC IDC SESS DTM: 20200127143425
MDC IDC SET LEADCHNL LV PACING AMPLITUDE: 2.5 V
MDC IDC SET LEADCHNL LV PACING PULSEWIDTH: 0.4 ms
MDC IDC SET LEADCHNL RA PACING AMPLITUDE: 1.5 V
MDC IDC SET LEADCHNL RV PACING PULSEWIDTH: 0.4 ms
MDC IDC STAT BRADY RA PERCENT PACED: 65.33 %
MDC IDC STAT BRADY RV PERCENT PACED: 95.46 %

## 2018-10-14 ENCOUNTER — Telehealth: Payer: Self-pay

## 2018-10-14 NOTE — Telephone Encounter (Signed)
Gave Pt information to scheduling to make follow up appt to discuss results of ECHO.

## 2018-10-29 ENCOUNTER — Encounter: Payer: Self-pay | Admitting: Nurse Practitioner

## 2018-11-04 ENCOUNTER — Other Ambulatory Visit: Payer: Self-pay

## 2018-11-04 MED ORDER — AMLODIPINE BESYLATE 5 MG PO TABS: ORAL_TABLET | ORAL | 0 refills | Status: DC

## 2018-11-04 NOTE — Telephone Encounter (Signed)
Rx(s) sent to pharmacy electronically.  

## 2018-11-17 ENCOUNTER — Encounter: Payer: Self-pay | Admitting: Internal Medicine

## 2018-11-17 ENCOUNTER — Ambulatory Visit (INDEPENDENT_AMBULATORY_CARE_PROVIDER_SITE_OTHER): Payer: Self-pay | Admitting: Internal Medicine

## 2018-11-17 ENCOUNTER — Other Ambulatory Visit: Payer: Self-pay | Admitting: Cardiology

## 2018-11-17 VITALS — BP 146/82 | HR 70 | Ht 68.0 in | Wt 156.0 lb

## 2018-11-17 DIAGNOSIS — I472 Ventricular tachycardia, unspecified: Secondary | ICD-10-CM

## 2018-11-17 DIAGNOSIS — Z95810 Presence of automatic (implantable) cardiac defibrillator: Secondary | ICD-10-CM

## 2018-11-17 DIAGNOSIS — I5042 Chronic combined systolic (congestive) and diastolic (congestive) heart failure: Secondary | ICD-10-CM

## 2018-11-17 DIAGNOSIS — I447 Left bundle-branch block, unspecified: Secondary | ICD-10-CM

## 2018-11-17 DIAGNOSIS — I255 Ischemic cardiomyopathy: Secondary | ICD-10-CM

## 2018-11-17 NOTE — Patient Instructions (Signed)
Medication Instructions:  Your physician recommends that you continue on your current medications as directed. Please refer to the Current Medication list given to you today.  Labwork: None ordered.  Testing/Procedures: None ordered.  Follow-Up: Your physician recommends that you schedule a follow-up appointment in:   1st Available with Dr Swaziland  12 months with Gypsy Balsam, NP  Any Other Special Instructions Will Be Listed Below (If Applicable).     If you need a refill on your cardiac medications before your next appointment, please call your pharmacy.

## 2018-11-17 NOTE — Progress Notes (Signed)
PCP: Patient, No Pcp Per Primary Cardiologist: Dr Swaziland Primary EP: Dr Johney Frame  [name] is a 78 y.o. male who presents today for routine electrophysiology followup.  Since last being seen in our clinic, the patient reports doing reasonably well.  He has had nonproductive cough for several weeks.  He has COPD and is followed by Pulmonary team.  His SOB is stable.  Today, he denies symptoms of palpitations, chest pain,  lower extremity edema, dizziness, presyncope, syncope, or ICD shocks.  The patient is otherwise without complaint today.   Past Medical History:  Diagnosis Date  . CAD (coronary artery disease)    a. s/p stent RCA DES 9/09; b. 2014 Attempted PCI of OM1 @ [facility];  c. 04/2014 Cath: LAD 40-50p, D1 95-99 (chronic), LCX 30-40 inf branch, OM1 CTO, RCA 30-40p, RCA patent stent, EF 35%->Med Rx.  . Chronic combined systolic and diastolic CHF (congestive heart failure) (HCC)    a. EF about 40 to 45% per echo in April 2013;  b. 04/2014 Echo: EF 20-25%, sev LVH, sev glob HK, Gr 1 DD, mildly reduced RV fxn, PASP . c. 01/2017: EF improved to 50-55%.   . Chronic low back pain   . COPD (chronic obstructive pulmonary disease) (HCC) dx 06/2013   PFTs 07/08/13: mod obst with resp to bronchodilator, moderate decrease diffusion, airtrapping  . Dyslipidemia    a. on statin  . HLD (hyperlipidemia)   . HTN (hypertension)    a. Reports intolerance to hydralazine; b. no beta blockers 2/2 bradycardia;  c. failed on ACE and ARB.  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 LBBB (left bundle branch block)   . LVH (left ventricular hypertrophy)   . Mixed Ischemic/Non-ischemic Cardiomyopathy    a. 04/2014 Echo: EF 20-25%, sev glob HK.  [name] Noncompliance   . NSVT (nonsustained ventricular tachycardia) (HCC)   . Paroxysmal atrial fibrillation (HCC)    a. identified on device interrogation 01/2016   Past Surgical History:  Procedure Laterality Date  . BI-VENTRICULAR IMPLANTABLE  CARDIOVERTER DEFIBRILLATOR N/A 08/12/2014   MDT Ovidio Kin XT CRTD implanted by Dr Johney Frame  . CARDIAC CATHETERIZATION     ejection fraction 50%  . LEFT HEART CATHETERIZATION WITH CORONARY ANGIOGRAM N/A 05/08/2014   Procedure: LEFT HEART CATHETERIZATION WITH CORONARY ANGIOGRAM;  Surgeon: Micheline Chapman, MD;  Location[facility] CATH LAB;  Service: Cardiovascular;  Laterality: N/A;    ROS- all systems are reviewed and negative except as per HPI above  Current Outpatient Medications  Medication Sig Dispense Refill  . albuterol (PROVENTIL HFA;VENTOLIN HFA) 108 (90 Base) MCG/ACT inhaler Inhale 1 puff into the lungs every 6 (six) hours as needed for wheezing or shortness of breath. 1 Inhaler 5  . albuterol (PROVENTIL) (2.5 MG/3ML) 0.083% nebulizer solution Take 3 mLs (2.5 mg total) by nebulization every 6 (six) hours as needed for wheezing or shortness of breath. 360 mL 11  . amLODipine (NORVASC) 5 MG tablet TAKE 2 TABS BY MOUTH EVERY MORNING & 1 TAB EVERY EVENING NEEDS APPT WITH DR HILTY FOR FUTURE REFILLS 270 tablet 1  . atorvastatin (LIPITOR) 20 MG tablet Take 1 tablet (20 mg total) by mouth daily. 90 tablet 2  . budesonide-formoterol (SYMBICORT) 160-4.5 MCG/ACT inhaler TAKE 2 PUFFS BY MOUTH TWICE A DAY 1 Inhaler 0  . clopidogrel (PLAVIX) 75 MG tablet TAKE 1 TABLET BY MOUTH EVERY DAY 90 tablet 2  . isosorbide mononitrate (IMDUR) 60 MG 24 hr tablet TAKE 1 TABLET (60 MG  TOTAL) BY MOUTH DAILY. 90 tablet 2  . losartan (COZAAR) 100 MG tablet Take 1 tablet (100 mg total) by mouth daily. 90 tablet 3  . potassium chloride SA (K-DUR,KLOR-CON) 20 MEQ tablet Take 40 mEq by mouth daily.    . traMADol (ULTRAM) 50 MG tablet Take 1 tablet (50 mg total) by mouth every 6 (six) hours as needed. 10 tablet 0  . metoprolol tartrate (LOPRESSOR) 50 MG tablet Take 1 tablet (50 mg total) by mouth 2 (two) times daily. 180 tablet 3  . nitroGLYCERIN (NITROSTAT) 0.4 MG SL tablet Place 1 tablet (0.4 mg total) under the tongue every 5  (five) minutes as needed for chest pain. 25 tablet 3   No current facility-administered medications for this visit.     Physical Exam: Vitals:   11/17/18 1611  BP: (!) 146/82  Pulse: 70  SpO2: 97%  Weight: 156 lb (70.8 kg)  Height: 5\' 8"  (1.727 m)    GEN- The patient is well appearing, alert and oriented x 3 today.   Head- normocephalic, atraumatic Eyes-  Sclera clear, conjunctiva pink Ears- hearing intact Oropharynx- clear Lungs- diffuse expiratory wheezes, normal work of breathing Chest- ICD pocket is well healed Heart- Regular rate and rhythm, no murmurs, rubs or gallops, PMI not laterally displaced GI- soft, NT, ND, + BS Extremities- no clubbing, cyanosis, or edema  ICD interrogation- reviewed in detail today,  See PACEART report  ekg tracing ordered today is personally reviewed and shows AV paced  Wt Readings from Last 3 Encounters:  11/17/18 156 lb (70.8 kg)  09/15/18 157 lb 6.4 oz (71.4 kg)  06/20/18 158 lb (71.7 kg)    Assessment and Plan:  1.  Chronic systolic dysfunction/ ischemic CM/ CAD/ VT euvolemic today EF 45% by echo 11/19 (reviewed with patient today),  Severe LVH 3 episodes of ATP terminated VT over the past year, none since last visit (metoprolol restarted by EP NP) Stable on an appropriate medical regimen Normal BiV ICD function See Pace Art report No changes today he is not device dependant today ICM clinic tried to enroll but was unable to do so  2. HTN Stable No change required today  3. COPD Chronic SOB On exam, would benefit from further management by pulmonary team.  I have advised that he contact their office for sooner follow-up.  4. Paroxysmal atrial fibrillation/ atach Burden is <0.1%, limited by far field sensing and low p waves Will continue to follow clinically without anticoagulation given low burden (longest episode) despite chads2vasc score of 5.  I have advised that he follow-up with pulmonary as above He should obtain  a PCP (he is instructed to do so) [facility] Return to see EP NP every year Follow-up with Dr Swaziland (overdue)  Hillis Range MD, [facility] 11/17/2018 4:25 PM

## 2018-11-18 LAB — CUP PACEART INCLINIC DEVICE CHECK
Battery Voltage: 2.94 V
Brady Statistic AP VP Percent: 68.52 %
Brady Statistic AS VP Percent: 30.04 %
Brady Statistic RA Percent Paced: 66.29 %
Date Time Interrogation Session: 20200309201204
HIGH POWER IMPEDANCE MEASURED VALUE: 78 Ohm
Implantable Lead Implant Date: 20151203
Implantable Lead Location: 753859
Implantable Lead Location: 753860
Implantable Lead Model: 4598
Lead Channel Impedance Value: 304 Ohm
Lead Channel Impedance Value: 342 Ohm
Lead Channel Impedance Value: 342 Ohm
Lead Channel Impedance Value: 532 Ohm
Lead Channel Impedance Value: 532 Ohm
Lead Channel Impedance Value: 532 Ohm
Lead Channel Impedance Value: 570 Ohm
Lead Channel Pacing Threshold Amplitude: 0.625 V
Lead Channel Pacing Threshold Amplitude: 1.125 V
Lead Channel Pacing Threshold Pulse Width: 0.4 ms
Lead Channel Pacing Threshold Pulse Width: 0.4 ms
Lead Channel Sensing Intrinsic Amplitude: 0.375 mV
Lead Channel Setting Pacing Amplitude: 1.5 V
Lead Channel Setting Pacing Amplitude: 2.5 V
Lead Channel Setting Pacing Pulse Width: 0.4 ms
Lead Channel Setting Sensing Sensitivity: 0.3 mV
MDC IDC LEAD IMPLANT DT: 20151203
MDC IDC LEAD IMPLANT DT: 20151203
MDC IDC LEAD LOCATION: 753858
MDC IDC MSMT BATTERY REMAINING LONGEVITY: 24 mo
MDC IDC MSMT LEADCHNL LV IMPEDANCE VALUE: 323 Ohm
MDC IDC MSMT LEADCHNL LV IMPEDANCE VALUE: 323 Ohm
MDC IDC MSMT LEADCHNL LV IMPEDANCE VALUE: 380 Ohm
MDC IDC MSMT LEADCHNL LV IMPEDANCE VALUE: 532 Ohm
MDC IDC MSMT LEADCHNL RA IMPEDANCE VALUE: 380 Ohm
MDC IDC MSMT LEADCHNL RA SENSING INTR AMPL: 2.125 mV
MDC IDC MSMT LEADCHNL RV IMPEDANCE VALUE: 342 Ohm
MDC IDC MSMT LEADCHNL RV PACING THRESHOLD AMPLITUDE: 1.125 V
MDC IDC MSMT LEADCHNL RV PACING THRESHOLD PULSEWIDTH: 0.4 ms
MDC IDC MSMT LEADCHNL RV SENSING INTR AMPL: 22.75 mV
MDC IDC MSMT LEADCHNL RV SENSING INTR AMPL: 23.125 mV
MDC IDC PG IMPLANT DT: 20151203
MDC IDC SET LEADCHNL LV PACING AMPLITUDE: 2.25 V
MDC IDC SET LEADCHNL LV PACING PULSEWIDTH: 0.4 ms
MDC IDC STAT BRADY AP VS PERCENT: 0.39 %
MDC IDC STAT BRADY AS VS PERCENT: 1.05 %
MDC IDC STAT BRADY RV PERCENT PACED: 95.48 %

## 2018-12-03 ENCOUNTER — Other Ambulatory Visit: Payer: Self-pay | Admitting: *Deleted

## 2018-12-03 MED ORDER — AMLODIPINE BESYLATE 5 MG PO TABS: 5.0000 mg | ORAL_TABLET | Freq: Every day | ORAL | 3 refills | Status: DC

## 2018-12-16 ENCOUNTER — Other Ambulatory Visit: Payer: Self-pay | Admitting: Pulmonary Disease

## 2018-12-16 DIAGNOSIS — J449 Chronic obstructive pulmonary disease, unspecified: Secondary | ICD-10-CM

## 2019-01-05 ENCOUNTER — Ambulatory Visit (INDEPENDENT_AMBULATORY_CARE_PROVIDER_SITE_OTHER): Payer: Self-pay | Admitting: *Deleted

## 2019-01-05 ENCOUNTER — Other Ambulatory Visit: Payer: Self-pay

## 2019-01-05 DIAGNOSIS — I5042 Chronic combined systolic (congestive) and diastolic (congestive) heart failure: Secondary | ICD-10-CM

## 2019-01-05 DIAGNOSIS — I472 Ventricular tachycardia, unspecified: Secondary | ICD-10-CM

## 2019-01-06 LAB — CUP PACEART REMOTE DEVICE CHECK
Battery Remaining Longevity: 22 mo
Battery Voltage: 2.93 V
Brady Statistic AP VP Percent: 67.5 %
Brady Statistic AP VS Percent: 0.36 %
Brady Statistic AS VP Percent: 30.68 %
Brady Statistic AS VS Percent: 1.46 %
Brady Statistic RA Percent Paced: 66.62 %
Brady Statistic RV Percent Paced: 95.87 %
Date Time Interrogation Session: 20200427205732
HighPow Impedance: 68 Ohm
Implantable Lead Implant Date: 20151203
Implantable Lead Implant Date: 20151203
Implantable Lead Implant Date: 20151203
Implantable Lead Location: 753858
Implantable Lead Location: 753859
Implantable Lead Location: 753860
Implantable Lead Model: 4598
Implantable Lead Model: 5076
Implantable Lead Model: 6935
Implantable Pulse Generator Implant Date: 20151203
Lead Channel Impedance Value: 304 Ohm
Lead Channel Impedance Value: 323 Ohm
Lead Channel Impedance Value: 323 Ohm
Lead Channel Impedance Value: 323 Ohm
Lead Channel Impedance Value: 323 Ohm
Lead Channel Impedance Value: 342 Ohm
Lead Channel Impedance Value: 342 Ohm
Lead Channel Impedance Value: 380 Ohm
Lead Channel Impedance Value: 513 Ohm
Lead Channel Impedance Value: 532 Ohm
Lead Channel Impedance Value: 532 Ohm
Lead Channel Impedance Value: 532 Ohm
Lead Channel Impedance Value: 532 Ohm
Lead Channel Pacing Threshold Amplitude: 0.625 V
Lead Channel Pacing Threshold Amplitude: 1.125 V
Lead Channel Pacing Threshold Amplitude: 1.125 V
Lead Channel Pacing Threshold Pulse Width: 0.4 ms
Lead Channel Pacing Threshold Pulse Width: 0.4 ms
Lead Channel Pacing Threshold Pulse Width: 0.4 ms
Lead Channel Sensing Intrinsic Amplitude: 0.625 mV
Lead Channel Sensing Intrinsic Amplitude: 0.625 mV
Lead Channel Sensing Intrinsic Amplitude: 17.75 mV
Lead Channel Sensing Intrinsic Amplitude: 17.75 mV
Lead Channel Setting Pacing Amplitude: 1.5 V
Lead Channel Setting Pacing Amplitude: 2.25 V
Lead Channel Setting Pacing Amplitude: 2.5 V
Lead Channel Setting Pacing Pulse Width: 0.4 ms
Lead Channel Setting Pacing Pulse Width: 0.4 ms
Lead Channel Setting Sensing Sensitivity: 0.3 mV

## 2019-01-14 NOTE — Progress Notes (Signed)
Remote ICD transmission.   

## 2019-01-25 NOTE — Progress Notes (Unsigned)
[name] Date of Birth: 1941/06/04 Medical Record #[id number]  History of Present Illness: Mr. Wiltsie is seen for follow up of CAD and CHF. He has multiple medical issues which include HTN,  HLD and known CAD. He has a history of tobacco abuse. He has LV dysfunction with ejection fraction of 40-45% in April 2013.  Not able to take higher beta blocker dose due to his resting bradycardia.  He had a Myoview study in March of 2013 which showed normal perfusion. Ejection fraction of 40%. He was admitted to [facility] in December 2014 with chest pain. Cardiac cath showed patent RCA stent. There was a 50% mid LAD lesion. The first diagonal and first OM were occluded. Attempted PCI of the OM but unable to cross with wire. Review of old films here showed that this was a chronic obstruction. He has a history of NSVT. He was admitted in  August 2015 with VDRF with acute pulmonary edema. Echo showed drop in EF to 20-25%. In September 2015 he was admitted with syncope associated with bradycardia and overdiuresis. Diruetics were reduced and coreg was held. Repeat Echo in November 2015 showed EF of 30%. He did undergo ICD/BiV implant  by Dr. Johney Frame.  This summer he complained of dyspnea on exertion. No clinical evidence of CHF. Echo showed EF 45-50%. Global HK. Mild MR. Myoview study showed a fixed inferolateral infarct without ischemia. EF 39%. He ultimately underwent CPX. This was limited by submaximal exercise and severe hypertensive response. There was mild COPD. Exercise training and improved BP control recommended. Toprol was increased. He was noted to have 3 episodes of VT in the past year treated with ATP. None since Toprol increased.  He is followed by Dr. Kendrick Fries for COPD.   Current Outpatient Medications on File Prior to Visit  Medication Sig Dispense Refill  . albuterol (PROVENTIL HFA;VENTOLIN HFA) 108 (90 Base) MCG/ACT inhaler Inhale 1 puff into the lungs every 6 (six) hours as needed  for wheezing or shortness of breath. 1 Inhaler 5  . albuterol (PROVENTIL) (2.5 MG/3ML) 0.083% nebulizer solution Take 3 mLs (2.5 mg total) by nebulization every 6 (six) hours as needed for wheezing or shortness of breath. 360 mL 11  . amLODipine (NORVASC) 5 MG tablet Take 1 tablet (5 mg total) by mouth daily. 270 tablet 3  . atorvastatin (LIPITOR) 20 MG tablet Take 1 tablet (20 mg total) by mouth daily. 90 tablet 2  . budesonide-formoterol (SYMBICORT) 160-4.5 MCG/ACT inhaler TAKE 2 PUFFS BY MOUTH TWICE A DAY 30.6 Inhaler 3  . clopidogrel (PLAVIX) 75 MG tablet TAKE 1 TABLET BY MOUTH EVERY DAY 90 tablet 2  . isosorbide mononitrate (IMDUR) 60 MG 24 hr tablet TAKE 1 TABLET (60 MG TOTAL) BY MOUTH DAILY. 90 tablet 2  . losartan (COZAAR) 100 MG tablet Take 1 tablet (100 mg total) by mouth daily. 90 tablet 3  . metoprolol tartrate (LOPRESSOR) 50 MG tablet Take 1 tablet (50 mg total) by mouth 2 (two) times daily. 180 tablet 3  . nitroGLYCERIN (NITROSTAT) 0.4 MG SL tablet Place 1 tablet (0.4 mg total) under the tongue every 5 (five) minutes as needed for chest pain. 25 tablet 3  . potassium chloride SA (K-DUR,KLOR-CON) 20 MEQ tablet Take 40 mEq by mouth daily.    . traMADol (ULTRAM) 50 MG tablet Take 1 tablet (50 mg total) by mouth every 6 (six) hours as needed. 10 tablet 0   No current facility-administered medications on file prior to visit.  Allergies  Allergen Reactions  . Acyclovir And Related     unknown  . Aspirin Other (See Comments)    GI upset at high doses only.    Past Medical History:  Diagnosis Date  . CAD (coronary artery disease)    a. s/p stent RCA DES 9/09; b. 2014 Attempted PCI of OM1 @ [place];  c. 04/2014 Cath: LAD 40-50p, D1 95-99 (chronic), LCX 30-40 inf branch, OM1 CTO, RCA 30-40p, RCA patent stent, EF 35%->Med Rx.  . Chronic combined systolic and diastolic CHF (congestive heart failure) (HCC)    a. EF about 40 to 45% per echo in April 2013;  b. 04/2014 Echo: EF 20-25%,  sev LVH, sev glob HK, Gr 1 DD, mildly reduced RV fxn, PASP 38mmHg. c. 01/2017: EF improved to 50-55%.   . Chronic low back pain   . COPD (chronic obstructive pulmonary disease) (HCC) dx 06/2013   PFTs 07/08/13: mod obst with resp to bronchodilator, moderate decrease diffusion, airtrapping  . Dyslipidemia    a. on statin  . HLD (hyperlipidemia)   . HTN (hypertension)    a. Reports intolerance to hydralazine; b. no beta blockers 2/2 bradycardia;  c. failed on ACE and ARB.  [name]. 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 LBBB (left bundle branch block)   . LVH (left ventricular hypertrophy)   . Mixed Ischemic/Non-ischemic Cardiomyopathy    a. 04/2014 Echo: EF 20-25%, sev glob HK.  [name]. Noncompliance   . NSVT (nonsustained ventricular tachycardia) (HCC)   . Paroxysmal atrial fibrillation (HCC)    a. identified on device interrogation 01/2016    Past Surgical History:  Procedure Laterality Date  . BI-VENTRICULAR IMPLANTABLE CARDIOVERTER DEFIBRILLATOR N/A 08/12/2014   MDT Ovidio KinViva Quad XT CRTD implanted by Dr Johney FrameAllred  . CARDIAC CATHETERIZATION     ejection fraction 50%  . LEFT HEART CATHETERIZATION WITH CORONARY ANGIOGRAM N/A 05/08/2014   Procedure: LEFT HEART CATHETERIZATION WITH CORONARY ANGIOGRAM;  Surgeon: Micheline ChapmanMichael D Cooper, MD;  [facility];  Service: Cardiovascular;  Laterality: N/A;    Social History   Tobacco Use  Smoking Status Former Smoker  . Packs/day: 0.50  . Years: 56.00  . Pack years: 28.00  . Types: Cigarettes  . Last attempt to quit: 09/10/2012  . Years since quitting: 6.3  Smokeless Tobacco Never Used  Tobacco Comment   tried 1 month ago (05/2018) but didn't like it     Social History   Substance and Sexual Activity  Alcohol Use No    Family History  Problem Relation Age of Onset  . Heart attack Neg Hx   . Stroke Neg Hx     Review of Systems: As noted in history of present illness  All other systems were reviewed and are negative.  Physical Exam: There  were no vitals taken for this visit. Patient is pleasant and in no acute distress. Skin is warm and dry. Color is normal.  HEENT is unremarkable. Normocephalic/atraumatic. PERRL. Sclera are nonicteric. Neck is supple. No masses. No JVD. Lungs reveal diffuse inspiratory and expiratory wheezes.  Cardiac exam shows a regular rate and rhythm. Normal S1-2. No gallop or murmur. ICD site is healing well. Abdomen is soft. Extremities are without edema. Gait and ROM are intact. No gross neurologic deficits noted.   LABORATORY DATA:   Lab Results  Component Value Date   WBC 5.9 06/20/2018   HGB 13.0 06/20/2018   HCT 38.9 06/20/2018   PLT 203 06/20/2018   GLUCOSE 87 06/20/2018  CHOL 202 (H) 11/29/2015   TRIG 86 11/29/2015   HDL 70 11/29/2015   LDLDIRECT 132.9 06/18/2013   LDLCALC 115 11/29/2015   ALT 9 02/11/2018   AST 14 02/11/2018   NA 143 06/20/2018   K 3.9 06/20/2018   CL 103 06/20/2018   CREATININE 0.91 06/20/2018   BUN 8 06/20/2018   CO2 24 06/20/2018   TSH 2.67 01/31/2016   INR 1.09 06/20/2014   Echo: 02/20/16:Study Conclusions  - Left ventricle: There was moderate concentric hypertrophy.   Systolic function was mildly reduced. The estimated ejection   fraction was in the range of 45% to 50%. Mild diffuse hypokinesis   with no identifiable regional variations. Abnormal relaxation   with increased filling pressures. - Ventricular septum: Septal motion showed paradox. These changes   are consistent with right ventricular pacing. - Mitral valve: There was mild regurgitation  Myoview 02/20/16: Study Highlights    Nuclear stress EF: 39%. Diffuse hypokinesis with asynchronous contraction consistent with pacemaker  There was no ST segment deviation noted during stress.  Defect 1: There is a small defect of moderate severity present in the mid inferolateral location. Question possible focal lateral infarction.  Findings consistent with prior myocardial infarction. No areas of  ischemia identified.  This is an intermediate risk study.   Donato Schultz, MD   CPX: 07/05/16: Narrative   Referred for: Exertional Dyspnea   Procedure: This patient underwent staged symptom-limited exercise treadmill testing using a modified Naughton protocol with expired gas analysis metabolic evaluation during exercise.  Demographics  Age: 51 Ht. (in.) 68.5 Wt. (lb) 151 BMI: 22.6   Predicted Peak VO2: 26.6  Gender: Male Ht (cm) 174 Wt. (kg) 68.5    Results  Pre-Exercise PFTs   FVC 2.70 (81%)     FEV1 1.64 (64%)      FEV1/FVC 61 (79%)      MVV 70 (58%)      Exercise Time:  9:06  Speed (mph): 2.0    Grade (%): 10.5    RPE: 17  Reason stopped: Test was ended due to hypertensive response achieving termination criteria prior to maximal exercise.  Additional symptoms: bilateral hip pain (5/10) dyspnea (3/10)  Resting HR: 65 Peak HR: 127  (86% age predicted max HR)  BP rest: 180/66 BP peak: 246/94  Peak VO2: 17.5 (66% predicted peak VO2)  VE/VCO2 slope: 33.4  OUES: 1.94  Peak RER: 0.98  Ventilatory Threshold: 16.5 (62% predicted or measured peak VO2)  Peak RR 34  Peak Ventilation: 44.3  VE/MVV: 63%  PETCO2 at peak: 31  O2pulse: 9  (75% predicted O2pulse)   Interpretation  Notes: Patient gave a very good effort. Pulse-oximetry remained 96% or above for the duration of exercise.   ECG: Resting ECG AV paced rhythm. HR response appropriate. PVCs occasional. There were no sustained arrhythmias and no ST-T changes. BP hypertensive at rest with increasingly significant hypertensive response to incremental exercise.  PFT: Pre-exercise spirometry suggests possible mild restrictive/obstructive patterns. MVV below normal.   CPX: Exercise testing with gas exchange demonstrates a moderately reduced peak VO2 of 17.5 ml/kg/min (66% of the age/gender/weight matched sedentary norms). The RER of 0.98 indicates a submaximal effort. The  VE/VCO2 slope is mildly elevated and indicates increased dead space ventiltion. The oxygen uptake efficiency slope (OUES) is moderately reduced. The VO2 at the ventilatory threshold was normal at 62% of the predicted peak VO2. At peak exercise, the ventilation reached 63% of the measured MVV indicating ventilatory reserve remained. The  O2pulse (a surrogate for stroke volume) increased with initial exercise but remained flat at 9 ml/beeat (75% predicted).   Conclusion: The interpretation of this test is limited due to submaximal effort during the exercise. Based on available data, exercise testing with gas exchange demonstrates moderate functional impairment. Pre-exercise spirometry suggests mild obstructive pattern. With significantly hypertensive response to exercise, flat O2 pulse and elevated VE/VCO2 slope patient is most likely limited due to diastolic dysfunction and possible early pulmonary vascular disease.    Test, report and preliminary impression by: Lesia Hausen, MS, ACSM-RCEP 07/05/2016 9:46 AM  Preliminary CPX Results, Finalized results will be forwarded when completed by interpreting physician.  Agree with above. This is submax test with mild functional limitation that is multifactorial in nature including COPD, deconditioning and severe hypertensive response with mild circulatory limitation. Would consider exercise training program and tighter BP control.   Bensimhon, Daniel,MD 10:24 PM    Echo 08/04/18: Study Conclusions  - Left ventricle: The cavity size was normal. There was severe   concentric hypertrophy. Doppler parameters are consistent with   abnormal left ventricular relaxation (grade 1 diastolic   dysfunction). - Mitral valve: There was mild to moderate regurgitation. - Right ventricle: Systolic function was mildly reduced. - Pericardium, extracardiac: A trivial pericardial effusion was   identified.  Assessment / Plan: 1. Chronic systolic and diastolic CHF  with mixed cardiomyopathy. Last EF about 45%. Volume status looks good. Continue current   Losartan, lopressor. Not on a diuretic due to history of syncope.  2. Coronary disease with remote stenting of the right coronary in 2009 with a drug-eluting stent. Chronic occlusion of OM. He is asymptomatic. Myoview study 2017 showed no ischemia.  3. Tobacco abuse. States his is no longer smoking. Brething is much better with addition of Symbicort suggesting his dyspnea more related to some COPD.  4. Hyperlipidemia  on Lipitor.   5. COPD with asthma.  6. HTN still suboptimal control. Will increase amlodipine to 10 mg in am and 5 mg in pm.  7. NSVT  8. S/p ICD/BiV pacemaker.    Follow up in 6 months.

## 2019-01-28 ENCOUNTER — Telehealth: Payer: Self-pay | Admitting: Cardiology

## 2019-01-28 ENCOUNTER — Telehealth: Payer: Self-pay

## 2019-01-28 NOTE — Telephone Encounter (Signed)
Called patient to prechart before their 4:00 virtual visit with Dr. [place] and it went to voicemail, but you could not leave a message

## 2019-03-19 NOTE — Progress Notes (Deleted)
@Patient  ID: [name], male    DOB: Jul 18, 1941, 78 y.o.   MRN: [id number]  No chief complaint on file.   Referring provider: No ref. provider found  HPI:  78 year old male patient followed in our office for COPD, prev history of nocturnal hypoxemia (pt declined nocturnal oxygen, recently diagnosed obstructive sleep apnea (sleep study June/2019-AHI 7-hour).  PMH: Ischemic cardiomyopathy, hypertension, CAD, CHF, allergic rhinitis Smoker/ Smoking History: Former Smoker. Quit in 2014. 28 pack years. Maintenance:   Pt of: Dr. Lake Bells   03/19/2019  - Visit   HPI  Tests:   06/11/16 - home sleep study shows AHI of 2.9 an hour with the lowest SaO2 of 79% with an average of 93%  03/08/2018-Home sleep study-AHI 7/HR, SaO2 low 81%  Imaging:  05/15/2016-chest x-ray-hyperexpansion with areas of scarring bilaterally 02/04/2016-CT maxillofacial with contrast- prominent soft tissue swelling, mucosal thickening of the right maxillary sinus and small mucous retention cyst or polyp to the left maxillary sinus, numerous large maxillary mandibular dental caries noted  Cardiac:  02/20/2016-echocardiogram-LV ejection fraction 45 to 50%, mild regurgitation in the mitral valve   FENO:  No results found for: NITRICOXIDE  PFT: PFT Results Latest Ref Rng & Units 07/17/2016  FVC-Pre L 2.29  FVC-Predicted Pre % 67  FVC-Post L 2.54  FVC-Predicted Post % 74  Pre FEV1/FVC % % 62  Post FEV1/FCV % % 64  FEV1-Pre L 1.42  FEV1-Predicted Pre % 55  FEV1-Post L 1.63  DLCO UNC% % 58  DLCO COR %Predicted % 93  TLC L 5.10  TLC % Predicted % 78  RV % Predicted % 112    Imaging: No results found.    Specialty Problems      Pulmonary Problems   Dyspnea   COPD with asthma ([place])    07/08/13 PFT: FEV1 1.74L (66% pred, 30% change with BD), mod obst with resp to bronchodilator, moderate decrease diffusion, air-trapping 11/2013 Simple spiro>> clear obstruction, FEV1 1.30 L (47% pred) - trial of symbicort  160 2bid 01/26/15        Pneumonia   Pulmonary edema   Respiratory arrest ([place])   Nocturnal hypoxemia    06/2016 O2 saturation nadir 79% on RA 11/2/107 O2 saturation on RA, below 88% 29.6 minutes, longest was 7.9 minutes November 2017 refused further oxygen based on symptoms of sinus congestion, refused treatment for the same      Allergic rhinitis   COPD with acute exacerbation (HCC)   OSA (obstructive sleep apnea)    06/11/16 - home sleep study shows AHI of 2.9 an hour with the lowest SaO2 of 79% with an average of 93%  03/08/2018-Home sleep study-AHI 7/HR, SaO2 low 81%          Allergies  Allergen Reactions  . Acyclovir And Related     unknown  . Aspirin Other (See Comments)    GI upset at high doses only.    Immunization History  Administered Date(s) Administered  . Influenza,inj,Quad PF,6+ Mos 06/10/2014  . Pneumococcal Conjugate-13 11/12/2013  . Tdap 12/13/2017    Past Medical History:  Diagnosis Date  . CAD (coronary artery disease)    a. s/p stent RCA DES 9/09; b. 2014 Attempted PCI of OM1 @ [place];  c. 04/2014 Cath: LAD 40-50p, D1 95-99 (chronic), LCX 30-40 inf branch, OM1 CTO, RCA 30-40p, RCA patent stent, EF 35%->Med Rx.  . Chronic combined systolic and diastolic CHF (congestive heart failure) (HCC)    a. EF about 40 to  45% per echo in April 2013;  b. 04/2014 Echo: EF 20-25%, sev LVH, sev glob HK, Gr 1 DD, mildly reduced RV fxn, PASP 38mmHg. c. 01/2017: EF improved to 50-55%.   . Chronic low back pain   . COPD (chronic obstructive pulmonary disease) (HCC) dx 06/2013   PFTs 07/08/13: mod obst with resp to bronchodilator, moderate decrease diffusion, airtrapping  . Dyslipidemia    a. on statin  . HLD (hyperlipidemia)   . HTN (hypertension)    a. Reports intolerance to hydralazine; b. no beta blockers 2/2 bradycardia;  c. failed on ACE and ARB.  [name]. 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 LBBB (left bundle branch block)   . LVH (left ventricular  hypertrophy)   . Mixed Ischemic/Non-ischemic Cardiomyopathy    a. 04/2014 Echo: EF 20-25%, sev glob HK.  [name]. Noncompliance   . NSVT (nonsustained ventricular tachycardia) (HCC)   . Paroxysmal atrial fibrillation (HCC)    a. identified on device interrogation 01/2016    Tobacco History: Social History   Tobacco Use  Smoking Status Former Smoker  . Packs/day: 0.50  . Years: 56.00  . Pack years: 28.00  . Types: Cigarettes  . Quit date: 09/10/2012  . Years since quitting: 6.5  Smokeless Tobacco Never Used  Tobacco Comment   tried 1 month ago (05/2018) but didn't like it    Counseling given: Not Answered Comment: tried 1 month ago (05/2018) but didn't like it    Continue to not smoke  Outpatient Encounter Medications as of 03/20/2019  Medication Sig  . albuterol (PROVENTIL HFA;VENTOLIN HFA) 108 (90 Base) MCG/ACT inhaler Inhale 1 puff into the lungs every 6 (six) hours as needed for wheezing or shortness of breath.  [name]. albuterol (PROVENTIL) (2.5 MG/3ML) 0.083% nebulizer solution Take 3 mLs (2.5 mg total) by nebulization every 6 (six) hours as needed for wheezing or shortness of breath.  [name]. amLODipine (NORVASC) 5 MG tablet Take 1 tablet (5 mg total) by mouth daily.  [name]. atorvastatin (LIPITOR) 20 MG tablet Take 1 tablet (20 mg total) by mouth daily.  . budesonide-formoterol (SYMBICORT) 160-4.5 MCG/ACT inhaler TAKE 2 PUFFS BY MOUTH TWICE A DAY  . clopidogrel (PLAVIX) 75 MG tablet TAKE 1 TABLET BY MOUTH EVERY DAY  . isosorbide mononitrate (IMDUR) 60 MG 24 hr tablet TAKE 1 TABLET (60 MG TOTAL) BY MOUTH DAILY.  [name]. losartan (COZAAR) 100 MG tablet Take 1 tablet (100 mg total) by mouth daily.  . metoprolol tartrate (LOPRESSOR) 50 MG tablet Take 1 tablet (50 mg total) by mouth 2 (two) times daily.  . nitroGLYCERIN (NITROSTAT) 0.4 MG SL tablet Place 1 tablet (0.4 mg total) under the tongue every 5 (five) minutes as needed for chest pain.  . potassium chloride SA (K-DUR,KLOR-CON) 20 MEQ tablet Take 40 mEq  by mouth daily.  . traMADol (ULTRAM) 50 MG tablet Take 1 tablet (50 mg total) by mouth every 6 (six) hours as needed.   No facility-administered encounter medications on file as of 03/20/2019.      Review of Systems  Review of Systems   Physical Exam  There were no vitals taken for this visit.  Wt Readings from Last 5 Encounters:  11/17/18 156 lb (70.8 kg)  09/15/18 157 lb 6.4 oz (71.4 kg)  06/20/18 158 lb (71.7 kg)  06/16/18 162 lb (73.5 kg)  04/18/18 161 lb 12.8 oz (73.4 kg)     Physical Exam   Lab Results:  CBC    Component Value Date/Time   WBC  5.9 06/20/2018 0843   WBC 9.4 02/04/2016 0503   RBC 4.61 06/20/2018 0843   RBC 3.86 (L) 02/04/2016 0503   HGB 13.0 06/20/2018 0843   HCT 38.9 06/20/2018 0843   PLT 203 06/20/2018 0843   MCV 84 06/20/2018 0843   MCH 28.2 06/20/2018 0843   MCH 27.7 02/04/2016 0503   MCHC 33.4 06/20/2018 0843   MCHC 31.2 02/04/2016 0503   RDW 14.2 06/20/2018 0843   LYMPHSABS 1.5 02/03/2016 2220   MONOABS 1.1 (H) 02/03/2016 2220   EOSABS 0.0 02/03/2016 2220   BASOSABS 0.0 02/03/2016 2220    BMET    Component Value Date/Time   NA 143 06/20/2018 0843   K 3.9 06/20/2018 0843   CL 103 06/20/2018 0843   CO2 24 06/20/2018 0843   GLUCOSE 87 06/20/2018 0843   GLUCOSE 100 (H) 02/11/2018 1728   BUN 8 06/20/2018 0843   CREATININE 0.91 06/20/2018 0843   CREATININE 0.83 04/17/2016 0330   CALCIUM 9.5 06/20/2018 0843   GFRNONAA 83 06/20/2018 0843   GFRNONAA 88 04/17/2016 0330   GFRAA 96 06/20/2018 0843   GFRAA >89 04/17/2016 0330    BNP    Component Value Date/Time   BNP 95.4 04/17/2016 1055    ProBNP    Component Value Date/Time   PROBNP 218.0 (H) 01/26/2015 1143      Assessment & Plan:   No problem-specific Assessment & Plan notes found for this encounter.    No follow-ups on file.   Coral Ceo, NP 03/19/2019   This appointment was *** minutes long with over 50% of the time in direct face-to-face patient care,  assessment, plan of care, and follow-up.

## 2019-03-20 ENCOUNTER — Ambulatory Visit: Payer: Self-pay | Admitting: Pulmonary Disease

## 2019-04-06 ENCOUNTER — Ambulatory Visit (INDEPENDENT_AMBULATORY_CARE_PROVIDER_SITE_OTHER): Payer: Self-pay | Admitting: *Deleted

## 2019-04-06 DIAGNOSIS — I255 Ischemic cardiomyopathy: Secondary | ICD-10-CM

## 2019-04-06 LAB — CUP PACEART REMOTE DEVICE CHECK
Battery Remaining Longevity: 19 mo
Battery Voltage: 2.92 V
Brady Statistic AP VP Percent: 58.01 %
Brady Statistic AP VS Percent: 0.14 %
Brady Statistic AS VP Percent: 40.51 %
Brady Statistic AS VS Percent: 1.35 %
Brady Statistic RA Percent Paced: 56.2 %
Brady Statistic RV Percent Paced: 95.59 %
Date Time Interrogation Session: 20200727052303
HighPow Impedance: 70 Ohm
Implantable Lead Implant Date: 20151203
Implantable Lead Implant Date: 20151203
Implantable Lead Implant Date: 20151203
Implantable Lead Location: 753858
Implantable Lead Location: 753859
Implantable Lead Location: 753860
Implantable Lead Model: 4598
Implantable Lead Model: 5076
Implantable Lead Model: 6935
Implantable Pulse Generator Implant Date: 20151203
Lead Channel Impedance Value: 323 Ohm
Lead Channel Impedance Value: 342 Ohm
Lead Channel Impedance Value: 342 Ohm
Lead Channel Impedance Value: 342 Ohm
Lead Channel Impedance Value: 342 Ohm
Lead Channel Impedance Value: 342 Ohm
Lead Channel Impedance Value: 380 Ohm
Lead Channel Impedance Value: 380 Ohm
Lead Channel Impedance Value: 570 Ohm
Lead Channel Impedance Value: 570 Ohm
Lead Channel Impedance Value: 570 Ohm
Lead Channel Impedance Value: 589 Ohm
Lead Channel Impedance Value: 589 Ohm
Lead Channel Pacing Threshold Amplitude: 0.625 V
Lead Channel Pacing Threshold Amplitude: 1 V
Lead Channel Pacing Threshold Amplitude: 1.25 V
Lead Channel Pacing Threshold Pulse Width: 0.4 ms
Lead Channel Pacing Threshold Pulse Width: 0.4 ms
Lead Channel Pacing Threshold Pulse Width: 0.4 ms
Lead Channel Sensing Intrinsic Amplitude: 0.75 mV
Lead Channel Sensing Intrinsic Amplitude: 0.75 mV
Lead Channel Sensing Intrinsic Amplitude: 24.5 mV
Lead Channel Sensing Intrinsic Amplitude: 24.5 mV
Lead Channel Setting Pacing Amplitude: 1.5 V
Lead Channel Setting Pacing Amplitude: 2 V
Lead Channel Setting Pacing Amplitude: 2.5 V
Lead Channel Setting Pacing Pulse Width: 0.4 ms
Lead Channel Setting Pacing Pulse Width: 0.4 ms
Lead Channel Setting Sensing Sensitivity: 0.3 mV

## 2019-04-24 ENCOUNTER — Encounter: Payer: Self-pay | Admitting: Cardiology

## 2019-04-24 NOTE — Progress Notes (Signed)
Remote ICD transmission.   

## 2019-06-22 ENCOUNTER — Other Ambulatory Visit: Payer: Self-pay | Admitting: Cardiology

## 2019-07-07 ENCOUNTER — Ambulatory Visit (INDEPENDENT_AMBULATORY_CARE_PROVIDER_SITE_OTHER): Payer: Self-pay | Admitting: *Deleted

## 2019-07-07 DIAGNOSIS — R55 Syncope and collapse: Secondary | ICD-10-CM

## 2019-07-07 DIAGNOSIS — I255 Ischemic cardiomyopathy: Secondary | ICD-10-CM

## 2019-07-08 LAB — CUP PACEART REMOTE DEVICE CHECK
Battery Remaining Longevity: 19 mo
Battery Voltage: 2.92 V
Brady Statistic AP VP Percent: 69.42 %
Brady Statistic AP VS Percent: 0.14 %
Brady Statistic AS VP Percent: 28.6 %
Brady Statistic AS VS Percent: 1.84 %
Brady Statistic RA Percent Paced: 65.25 %
Brady Statistic RV Percent Paced: 91.36 %
Date Time Interrogation Session: 20201028013626
HighPow Impedance: 72 Ohm
Implantable Lead Implant Date: 20151203
Implantable Lead Implant Date: 20151203
Implantable Lead Implant Date: 20151203
Implantable Lead Location: 753858
Implantable Lead Location: 753859
Implantable Lead Location: 753860
Implantable Lead Model: 4598
Implantable Lead Model: 5076
Implantable Lead Model: 6935
Implantable Pulse Generator Implant Date: 20151203
Lead Channel Impedance Value: 304 Ohm
Lead Channel Impedance Value: 323 Ohm
Lead Channel Impedance Value: 323 Ohm
Lead Channel Impedance Value: 323 Ohm
Lead Channel Impedance Value: 342 Ohm
Lead Channel Impedance Value: 342 Ohm
Lead Channel Impedance Value: 380 Ohm
Lead Channel Impedance Value: 380 Ohm
Lead Channel Impedance Value: 532 Ohm
Lead Channel Impedance Value: 532 Ohm
Lead Channel Impedance Value: 532 Ohm
Lead Channel Impedance Value: 532 Ohm
Lead Channel Impedance Value: 570 Ohm
Lead Channel Pacing Threshold Amplitude: 0.625 V
Lead Channel Pacing Threshold Amplitude: 1 V
Lead Channel Pacing Threshold Amplitude: 1 V
Lead Channel Pacing Threshold Pulse Width: 0.4 ms
Lead Channel Pacing Threshold Pulse Width: 0.4 ms
Lead Channel Pacing Threshold Pulse Width: 0.4 ms
Lead Channel Sensing Intrinsic Amplitude: 0.875 mV
Lead Channel Sensing Intrinsic Amplitude: 0.875 mV
Lead Channel Sensing Intrinsic Amplitude: 10 mV
Lead Channel Sensing Intrinsic Amplitude: 10 mV
Lead Channel Setting Pacing Amplitude: 1.5 V
Lead Channel Setting Pacing Amplitude: 2 V
Lead Channel Setting Pacing Amplitude: 2 V
Lead Channel Setting Pacing Pulse Width: 0.4 ms
Lead Channel Setting Pacing Pulse Width: 0.4 ms
Lead Channel Setting Sensing Sensitivity: 0.3 mV

## 2019-07-29 NOTE — Progress Notes (Signed)
Remote ICD transmission.   

## 2019-09-01 ENCOUNTER — Telehealth: Payer: Self-pay | Admitting: Internal Medicine

## 2019-09-01 NOTE — Telephone Encounter (Signed)
Patient is calling stating he needs a new wallet identification card for his defibrillator due to him losing his.

## 2019-09-01 NOTE — Telephone Encounter (Signed)
I let the pt know we do not have the pocket Identification card at the office. I gave him the number to [employer] support to call to get a copy of the identification card.

## 2019-09-28 ENCOUNTER — Other Ambulatory Visit: Payer: Self-pay | Admitting: Nurse Practitioner

## 2019-10-06 ENCOUNTER — Ambulatory Visit (INDEPENDENT_AMBULATORY_CARE_PROVIDER_SITE_OTHER): Payer: Self-pay | Admitting: *Deleted

## 2019-10-06 DIAGNOSIS — I429 Cardiomyopathy, unspecified: Secondary | ICD-10-CM

## 2019-10-07 LAB — CUP PACEART REMOTE DEVICE CHECK
Battery Remaining Longevity: 20 mo
Battery Voltage: 2.9 V
Brady Statistic AP VP Percent: 65.58 %
Brady Statistic AP VS Percent: 0.11 %
Brady Statistic AS VP Percent: 32.81 %
Brady Statistic AS VS Percent: 1.51 %
Brady Statistic RA Percent Paced: 62.43 %
Brady Statistic RV Percent Paced: 95.14 %
Date Time Interrogation Session: 20210126001604
HighPow Impedance: 67 Ohm
Implantable Lead Implant Date: 20151203
Implantable Lead Implant Date: 20151203
Implantable Lead Implant Date: 20151203
Implantable Lead Location: 753858
Implantable Lead Location: 753859
Implantable Lead Location: 753860
Implantable Lead Model: 4598
Implantable Lead Model: 5076
Implantable Lead Model: 6935
Implantable Pulse Generator Implant Date: 20151203
Lead Channel Impedance Value: 304 Ohm
Lead Channel Impedance Value: 304 Ohm
Lead Channel Impedance Value: 323 Ohm
Lead Channel Impedance Value: 323 Ohm
Lead Channel Impedance Value: 323 Ohm
Lead Channel Impedance Value: 342 Ohm
Lead Channel Impedance Value: 342 Ohm
Lead Channel Impedance Value: 399 Ohm
Lead Channel Impedance Value: 494 Ohm
Lead Channel Impedance Value: 494 Ohm
Lead Channel Impedance Value: 513 Ohm
Lead Channel Impedance Value: 513 Ohm
Lead Channel Impedance Value: 532 Ohm
Lead Channel Pacing Threshold Amplitude: 0.625 V
Lead Channel Pacing Threshold Amplitude: 1.25 V
Lead Channel Pacing Threshold Amplitude: 1.375 V
Lead Channel Pacing Threshold Pulse Width: 0.4 ms
Lead Channel Pacing Threshold Pulse Width: 0.4 ms
Lead Channel Pacing Threshold Pulse Width: 0.4 ms
Lead Channel Sensing Intrinsic Amplitude: 0.75 mV
Lead Channel Sensing Intrinsic Amplitude: 0.75 mV
Lead Channel Sensing Intrinsic Amplitude: 20.625 mV
Lead Channel Sensing Intrinsic Amplitude: 20.625 mV
Lead Channel Setting Pacing Amplitude: 1.5 V
Lead Channel Setting Pacing Amplitude: 2.25 V
Lead Channel Setting Pacing Amplitude: 2.75 V
Lead Channel Setting Pacing Pulse Width: 0.4 ms
Lead Channel Setting Pacing Pulse Width: 0.4 ms
Lead Channel Setting Sensing Sensitivity: 0.3 mV

## 2019-10-16 ENCOUNTER — Telehealth: Payer: Self-pay

## 2019-10-16 NOTE — Telephone Encounter (Signed)
Manual transmission received from [facility].  Report shows 5 episodes of NSVT between 1/28- 2/3, longest episode was 21 beats.  Report also indicates Optivol levels elevated.  Current medications include Metoprolol 50mg  BID, no diuretics prescribed.    Spoke with pt, he sent transmission last night after returning home from traveling due to a death in the family.  Pt reports while he was traveling he had an episode where he felt dizzy/ weak.  His BP was elevated so he took an extra Amlodipine with good effect.  Pt was unable to provide exact date/ time of episode but it was sometime between 1/28 and 2/4.  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  Pt confirmed he is taking medications as ordered.    Pt received a letter to call and schedule an appt for March, he requested that someone call him to make this appt.

## 2019-10-31 ENCOUNTER — Ambulatory Visit: Payer: Self-pay | Attending: Internal Medicine

## 2019-10-31 DIAGNOSIS — Z23 Encounter for immunization: Secondary | ICD-10-CM | POA: Insufficient documentation

## 2019-10-31 NOTE — Progress Notes (Signed)
   Covid-19 Vaccination Clinic  Name:  MARQUET FAIRCLOTH    MRN: [id number] DOB: 1940-12-02  10/31/2019  Mr. Reeder was observed post Covid-19 immunization for 15 minutes without incidence. He was provided with Vaccine Information Sheet and instruction to access the V-Safe system.   Mr. Mahar was instructed to call 911 with any severe reactions post vaccine: [name] Difficulty breathing  . Swelling of your face and throat  . A fast heartbeat  . A bad rash all over your body  . Dizziness and weakness    Immunizations Administered    Name Date Dose VIS Date Route   Pfizer COVID-19 Vaccine 10/31/2019  9:36 AM 0.3 mL 08/21/2019 Intramuscular   Manufacturer: [employer], [employer]   Lot: [id number]   NDC: [address]-0

## 2019-11-23 ENCOUNTER — Ambulatory Visit: Payer: Self-pay | Attending: Internal Medicine

## 2019-11-23 DIAGNOSIS — Z23 Encounter for immunization: Secondary | ICD-10-CM

## 2019-11-23 NOTE — Progress Notes (Signed)
   Covid-19 Vaccination Clinic  Name:  [name]    MRN: [id number] DOB: [birth year]  11/23/2019  Mr. Grape was observed post Covid-19 immunization for 15 minutes without incident. He was provided with Vaccine Information Sheet and instruction to access the V-Safe system.   [name] was instructed to call 911 with any severe reactions post vaccine: [name] Difficulty breathing  . Swelling of face and throat  . A fast heartbeat  . A bad rash all over body  . Dizziness and weakness   Immunizations Administered    Name Date Dose VIS Date Route   Pfizer COVID-19 Vaccine 11/23/2019  8:31 AM 0.3 mL 08/21/2019 Intramuscular   Manufacturer: [employer], [employer]   Lot: [id number]   NDC: [address]-7

## 2019-11-25 NOTE — Progress Notes (Deleted)
Electrophysiology Office Note Date: 11/25/2019  ID:  [name], DOB December 21, 1940, MRN [id number]  PCP: Patient, No Pcp Per Primary Cardiologist: [place] Electrophysiologist: Allred  CC: Routine ICD follow-up  [name] is a 79 y.o. male seen today for Dr [name].  He presents today for routine electrophysiology followup.  Since last being seen in our clinic, the patient reports doing very well. He denies chest pain, palpitations, dyspnea, PND, orthopnea, nausea, vomiting, dizziness, syncope, edema, weight gain, or early satiety.  He has not had ICD shocks.   Device History: MDT CRTD implanted 2015 for ICM, chronic systolic heart failure History of appropriate therapy: No History of AAD therapy: No  Past Medical History:  Diagnosis Date  . CAD (coronary artery disease)    a. s/p stent RCA DES 9/09; b. 2014 Attempted PCI of OM1 @ [facility];  c. 04/2014 Cath: LAD 40-50p, D1 95-99 (chronic), LCX 30-40 inf branch, OM1 CTO, RCA 30-40p, RCA patent stent, EF 35%->Med Rx.  . Chronic combined systolic and diastolic CHF (congestive heart failure) ([place])    a. EF about 40 to 45% per echo in April 2013;  b. 04/2014 Echo: EF 20-25%, sev LVH, sev glob HK, Gr 1 DD, mildly reduced RV fxn, PASP 32mmHg. c. 01/2017: EF improved to 50-55%.   . Chronic low back pain   . COPD (chronic obstructive pulmonary disease) ([place]) dx 06/2013   PFTs 07/08/13: mod obst with resp to bronchodilator, moderate decrease diffusion, airtrapping  . Dyslipidemia    a. on statin  . HLD (hyperlipidemia)   . HTN (hypertension)    a. Reports intolerance to hydralazine; b. no beta blockers 2/2 bradycardia;  c. failed on ACE and ARB.  [name] HTN (hypertension), malignant 05/08/2014  . LBBB (left bundle branch block)   . LVH (left ventricular hypertrophy)   . Mixed Ischemic/Non-ischemic Cardiomyopathy    a. 04/2014 Echo: EF 20-25%, sev glob HK.  [name] Noncompliance   . NSVT (nonsustained ventricular tachycardia) ([place])   .  Paroxysmal atrial fibrillation ([place])    a. identified on device interrogation 01/2016   Past Surgical History:  Procedure Laterality Date  . BI-VENTRICULAR IMPLANTABLE CARDIOVERTER DEFIBRILLATOR N/A 08/12/2014   MDT [name] CRTD implanted by Dr [name]  . CARDIAC CATHETERIZATION     ejection fraction 50%  . LEFT HEART CATHETERIZATION WITH CORONARY ANGIOGRAM N/A 05/08/2014   Procedure: LEFT HEART CATHETERIZATION WITH CORONARY ANGIOGRAM;  Surgeon: Vincent Ohara, MD;  Location: [facility] CATH LAB;  Service: Cardiovascular;  Laterality: N/A;    Current Outpatient Medications  Medication Sig Dispense Refill  . albuterol (PROVENTIL HFA;VENTOLIN HFA) 108 (90 Base) MCG/ACT inhaler Inhale 1 puff into the lungs every 6 (six) hours as needed for wheezing or shortness of breath. 1 Inhaler 5  . albuterol (PROVENTIL) (2.5 MG/3ML) 0.083% nebulizer solution Take 3 mLs (2.5 mg total) by nebulization every 6 (six) hours as needed for wheezing or shortness of breath. 360 mL 11  . amLODipine (NORVASC) 5 MG tablet Take 1 tablet (5 mg total) by mouth daily. 270 tablet 3  . atorvastatin (LIPITOR) 20 MG tablet Take 1 tablet (20 mg total) by mouth daily. 90 tablet 2  . budesonide-formoterol (SYMBICORT) 160-4.5 MCG/ACT inhaler TAKE 2 PUFFS BY MOUTH TWICE A DAY 30.6 Inhaler 3  . clopidogrel (PLAVIX) 75 MG tablet TAKE 1 TABLET BY MOUTH EVERY DAY 90 tablet 1  . isosorbide mononitrate (IMDUR) 60 MG 24 hr tablet TAKE 1 TABLET (60 MG TOTAL) BY  MOUTH DAILY. 90 tablet 2  . losartan (COZAAR) 100 MG tablet Take 1 tablet (100 mg total) by mouth daily. 90 tablet 3  . metoprolol tartrate (LOPRESSOR) 50 MG tablet TAKE 1 TABLET BY MOUTH TWICE A DAY 180 tablet 0  . nitroGLYCERIN (NITROSTAT) 0.4 MG SL tablet Place 1 tablet (0.4 mg total) under the tongue every 5 (five) minutes as needed for chest pain. 25 tablet 3  . potassium chloride SA (K-DUR,KLOR-CON) 20 MEQ tablet Take 40 mEq by mouth daily.    . traMADol (ULTRAM) 50 MG tablet  Take 1 tablet (50 mg total) by mouth every 6 (six) hours as needed. 10 tablet 0   No current facility-administered medications for this visit.    Allergies:   Acyclovir and related and Aspirin   Social History: Social History   Socioeconomic History  . Marital status: Married    Spouse name: Not on file  . Number of children: 3  . Years of education: Not on file  . Highest education level: Not on file  Occupational History  . Occupation: Electrical engineer.  Tobacco Use  . Smoking status: Former Smoker    Packs/day: 0.50    Years: 56.00    Pack years: 28.00    Types: Cigarettes    Quit date: 09/10/2012    Years since quitting: 7.2  . Smokeless tobacco: Never Used  . Tobacco comment: tried 1 month ago (05/2018) but didn't like it   Substance and Sexual Activity  . Alcohol use: No  . Drug use: No  . Sexual activity: Not Currently  Other Topics Concern  . Not on file  Social History Narrative          Social Determinants of Health   Financial Resource Strain:   . Difficulty of Paying Living Expenses:   Food Insecurity:   . Worried About Programme researcher, broadcasting/film/video in the Last Year:   . Barista in the Last Year:   Transportation Needs:   . Freight forwarder (Medical):   [name] Lack of Transportation (Non-Medical):   Physical Activity:   . Days of Exercise per Week:   . Minutes of Exercise per Session:   Stress:   . Feeling of Stress :   Social Connections:   . Frequency of Communication with Friends and Family:   . Frequency of Social Gatherings with Friends and Family:   . Attends Religious Services:   . Active Member of Clubs or Organizations:   . Attends Banker Meetings:   [name] Marital Status:   Intimate Partner Violence:   . Fear of Current or Ex-Partner:   . Emotionally Abused:   [name] Physically Abused:   . Sexually Abused:     Family History: Family History  Problem Relation Age of Onset  . Heart attack Neg Hx   . Stroke Neg Hx     Review  of Systems: All other systems reviewed and are otherwise negative except as noted above.   Physical Exam: VS:  There were no vitals taken for this visit. , BMI There is no height or weight on file to calculate BMI.  GEN- The patient is well appearing, alert and oriented x 3 today.   HEENT: normocephalic, atraumatic; sclera clear, conjunctiva pink; hearing intact; oropharynx clear; neck supple, no JVP Lymph- no cervical lymphadenopathy Lungs- Clear to ausculation bilaterally, normal work of breathing.  No wheezes, rales, rhonchi Heart- Regular rate and rhythm, no murmurs, rubs or gallops, PMI not  laterally displaced GI- soft, non-tender, non-distended, bowel sounds present, no hepatosplenomegaly Extremities- no clubbing, cyanosis, or edema; DP/PT/radial pulses 2+ bilaterally MS- no significant deformity or atrophy Skin- warm and dry, no rash or lesion; ICD pocket well healed Psych- euthymic mood, full affect Neuro- strength and sensation are intact  ICD interrogation- reviewed in detail today,  See PACEART report  EKG:  EKG is not ordered today.  Recent Labs: No results found for requested labs within last 8760 hours.   Wt Readings from Last 3 Encounters:  11/17/18 156 lb (70.8 kg)  09/15/18 157 lb 6.4 oz (71.4 kg)  06/20/18 158 lb (71.7 kg)     Other studies Reviewed: Additional studies/ records that were reviewed today include: Dr Jenel Lucks office notes   Assessment and Plan:  1.  Chronic systolic dysfunction euvolemic today Stable on an appropriate medical regimen Normal ICD function See Pace Art report No changes today  2.  HTN Stable No change required today  3.  Paroxysmal atrial fibrillation/ atrial tach AF burden by device interrogation ***% Will consider OAC for episodes > 24 hours  4.  VT No recent recurrence   5.  COPD With chronic stable shortness of breath  Overdue for pulmonary follow up - I have asked him to call their office to make an  appt   Current medicines are reviewed at length with the patient today.   The patient does not have concerns regarding his medicines.  The following changes were made today:  none  Labs/ tests ordered today include: none No orders of the defined types were placed in this encounter.    Disposition:   Follow up with Dr [place] 3 months, [facility], me in 1 year    Signed, Gypsy Balsam, NP 11/25/2019 10:42 AM  [facility] [address] [place] [address] [phone] (office) [phone] (fax)

## 2019-11-27 ENCOUNTER — Encounter: Payer: Self-pay | Admitting: Nurse Practitioner

## 2019-11-29 NOTE — Progress Notes (Deleted)
Cardiology Office Note Date:  11/29/2019  Patient ID:  [name], [name] 1941/03/29, MRN [id number] PCP:  Patient, No Pcp Per  Electrophysiologist:  Dr. Johney Frame Cardiologist: Dr. Sw[place] Pulmonologist: Dr. Kendrick Fries  ***refresh   Chief Complaint: *** annual visit  History of Present Illness: [name] is a 79 y.o. male with history of COPD, CAD (s/p stent RCA DES 9/09; b. 2014 Attempted PCI of OM1 @ [place] [facility];  c. 04/2014 Cath: LAD 40-50p, D1 95-99 (chronic), LCX 30-40 inf branch, OM1 CTO, RCA 30-40p, RCA patent stent, managed medically), ICM, chronic CHF (combined), HTN, HLD, LBBB, ICD,  AFib/Atach, OSA w/CPAP   He comes in today to be seen for dr. Johney Frame.  Last seen by him March 2020.  At that time chronic SOB/stable, no other complaints. Discussed he had had 3 episodes of ATP terminated VT over the past year, none since last visit when resumed on BB. Paroxysmal AFib, burden was <0.1%, limited by far field sensing and low p waves, planned to continue to follow clinically without anticoagulation given low burden  He was over due to see Dr. Swaz[place]and and advised to follow up with him and obtain a PMD.  Follow up with EP APP in a year..   *** needs to get updated with gen cards *** symptoms *** volume *** meds, CAD/CM *** labs, lipids *** needs primary *** Fam hx *** AF burden   Device information MDT CRT-D, implanted 2015, primary prevention + h//o appropriate ATP therapies for VT (resumed on BB) 2019  AAD Hx None to date   Past Medical History:  Diagnosis Date  . Acute renal failure (HCC) 05/31/2014  . Angina decubitus (HCC) 06/15/2014  . CAD (coronary artery disease)    a. s/p stent RCA DES 9/09; b. 2014 Attempted PCI of OM1 @ [place] [facility];  c. 04/2014 Cath: LAD 40-50p, D1 95-99 (chronic), LCX 30-40 inf branch, OM1 CTO, RCA 30-40p, RCA patent stent, EF 35%->Med Rx.  . Chronic combined systolic and diastolic CHF (congestive heart failure) (H[facility])    a. EF about 40 to 45%  per echo in April 2013;  b. 04/2014 Echo: EF 20-25%, sev LVH, sev glob HK, Gr 1 DD, mildly reduced RV fxn, PASP . c. 01/2017: EF improved to 50-55%.   . Chronic low back pain   . COPD (chronic obstructive pulmonary disease) ([facility]) dx 06/2013   PFTs 07/08/13: mod obst with resp to bronchodilator, moderate decrease diffusion, airtrapping  . COPD with acute exacerbation (HCC) 01/17/2017  . COPD with asthma (HCC)    07/08/13 PFT: FEV1 1.74L (66% pred, 30% change with BD), mod obst with resp to bronchodilator, moderate decrease diffusion, air-trapping 11/2013 Simple spiro>> clear obstruction, FEV1 1.30 L (47% pred) - trial of symbicort 160 2bid 01/26/15     . Depression 09/15/2018   Assessment: Increased sadness and depression since losing job  Plan: Patient denies suicidal and homicidal ideations Patient would not like to start medication therapies at this time Will establish patient with [facility] for primary care  . Dyslipidemia    a. on statin  . Essential hypertension   . HLD (hyperlipidemia)   . HTN (hypertension)    a. Reports intolerance to hydralazine; b. no beta blockers 2/2 bradycardia;  c. failed on ACE and ARB.  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 LBBB (left bundle branch block)   . LV dysfunction 12/13/2011  . LVH (left ventricular hypertrophy)   . Mixed Ischemic/Non-ischemic Cardiomyopathy  a. 04/2014 Echo: EF 20-25%, sev glob HK.  . Mixed Ischemic/Non-Ischemic Cardiomyopathy    probable mixed ischemic and non-ischemic   . Noncompliance   . NSVT (nonsustained ventricular tachycardia) (HCC)   . OSA (obstructive sleep apnea) 04/18/2018   06/11/16 - home sleep study shows AHI of 2.9 an hour with the lowest SaO2 of 79% with an average of 93%  03/08/2018-Home sleep study-AHI 7/HR, SaO2 low 81%   . Paroxysmal atrial fibrillation (HCC)    a. identified on device interrogation 01/2016  . Pneumonia 09/02/2013  . Pulmonary edema 05/10/2014  . Respiratory arrest  (HCC) 05/18/2014  . Sinus bradycardia 05/31/2014  . Syncope 05/30/2014    Past Surgical History:  Procedure Laterality Date  . BI-VENTRICULAR IMPLANTABLE CARDIOVERTER DEFIBRILLATOR N/A 08/12/2014   MDT Ovidio Kin XT CRTD implanted by Dr Johney Frame  . CARDIAC CATHETERIZATION     ejection fraction 50%  . LEFT HEART CATHETERIZATION WITH CORONARY ANGIOGRAM N/A 05/08/2014   Procedure: LEFT HEART CATHETERIZATION WITH CORONARY ANGIOGRAM;  Surgeon: Micheline Chapman, MD;  Location[facility] CATH LAB;  Service: Cardiovascular;  Laterality: N/A;    Current Outpatient Medications  Medication Sig Dispense Refill  . albuterol (PROVENTIL HFA;VENTOLIN HFA) 108 (90 Base) MCG/ACT inhaler Inhale 1 puff into the lungs every 6 (six) hours as needed for wheezing or shortness of breath. 1 Inhaler 5  . albuterol (PROVENTIL) (2.5 MG/3ML) 0.083% nebulizer solution Take 3 mLs (2.5 mg total) by nebulization every 6 (six) hours as needed for wheezing or shortness of breath. 360 mL 11  . amLODipine (NORVASC) 5 MG tablet Take 1 tablet (5 mg total) by mouth daily. 270 tablet 3  . atorvastatin (LIPITOR) 20 MG tablet Take 1 tablet (20 mg total) by mouth daily. 90 tablet 2  . budesonide-formoterol (SYMBICORT) 160-4.5 MCG/ACT inhaler TAKE 2 PUFFS BY MOUTH TWICE A DAY 30.6 Inhaler 3  . clopidogrel (PLAVIX) 75 MG tablet TAKE 1 TABLET BY MOUTH EVERY DAY 90 tablet 1  . isosorbide mononitrate (IMDUR) 60 MG 24 hr tablet TAKE 1 TABLET (60 MG TOTAL) BY MOUTH DAILY. 90 tablet 2  . losartan (COZAAR) 100 MG tablet Take 1 tablet (100 mg total) by mouth daily. 90 tablet 3  . metoprolol tartrate (LOPRESSOR) 50 MG tablet TAKE 1 TABLET BY MOUTH TWICE A DAY 180 tablet 0  . nitroGLYCERIN (NITROSTAT) 0.4 MG SL tablet Place 1 tablet (0.4 mg total) under the tongue every 5 (five) minutes as needed for chest pain. 25 tablet 3  . potassium chloride SA (K-DUR,KLOR-CON) 20 MEQ tablet Take 40 mEq by mouth daily.    . traMADol (ULTRAM) 50 MG tablet Take 1 tablet (50  mg total) by mouth every 6 (six) hours as needed. 10 tablet 0   No current facility-administered medications for this visit.    Allergies:   Acyclovir and related and Aspirin   Social History:  The patient  reports that he quit smoking about 7 years ago. His smoking use included cigarettes. He has a 28.00 pack-year smoking history. He has never used smokeless tobacco. He reports that he does not drink alcohol or use drugs.   Family History:  The patient's family history is not on file.***  ROS:  Please see the history of present illness.  All other systems are reviewed and otherwise negative.   PHYSICAL EXAM: *** VS:  There were no vitals taken for this visit. BMI: There is no height or weight on file to calculate BMI. Well nourished, well developed, in no  acute distress  HEENT: normocephalic, atraumatic  Neck: no JVD, carotid bruits or masses Cardiac:  *** RRR; no significant murmurs, no rubs, or gallops Lungs:  *** CTA b/l, no wheezing, rhonchi or rales  Abd: soft, nontender MS: no deformity or *** atrophy Ext: *** no edema  Skin: warm and dry, no rash Neuro:  No gross deficits appreciated Psych: euthymic mood, full affect  *** ICD site is stable, no tethering or discomfort   EKG:  Done today and reviewed by myself shows ***  ICD interrogation done today and reviewed by myself; ***  08/04/2018: TTE Study Conclusions  - Left ventricle: The cavity size was normal. There was severe  concentric hypertrophy. Doppler parameters are consistent with  abnormal left ventricular relaxation (grade 1 diastolic  dysfunction).  - Mitral valve: There was mild to moderate regurgitation.  - Right ventricle: Systolic function was mildly reduced.  - Pericardium, extracardiac: A trivial pericardial effusion was  identified.  LVEF is approximately 45% with hypokinesis worse  in the lateral wall. The cavity size was normal. There was severe  concentric hypertrophy. Doppler  parameters are consistent with  abnormal left ventricular relaxation (grade 1 diastolic  dysfunction).    07/05/2016: CPX Agree with above. This is submax test with mild functional limitation that is multifactorial in nature including COPD, deconditioning and severe hypertensive response with mild circulatory limitation. Would consider exercise training program and tighter BP control.    02/20/2016: stress myoview  Nuclear stress EF: 39%. Diffuse hypokinesis with asynchronous contraction consistent with pacemaker  There was no ST segment deviation noted during stress.  Defect 1: There is a small defect of moderate severity present in the mid inferolateral location. Question possible focal lateral infarction.  Findings consistent with prior myocardial infarction. No areas of ischemia identified.  This is an intermediate risk study   Recent Labs: No results found for requested labs within last 8760 hours.  No results found for requested labs within last 8760 hours.   CrCl cannot be calculated (Patient's most recent lab result is older than the maximum 21 days allowed.).   Wt Readings from Last 3 Encounters:  11/17/18 156 lb (70.8 kg)  09/15/18 157 lb 6.4 oz (71.4 kg)  06/20/18 158 lb (71.7 kg)     Other studies reviewed: Additional studies/records reviewed today include: summarized above  ASSESSMENT AND PLAN:  1. CRT-D     ***  2. ICM 3. Chronic CHF (combined)     Last LVEF 45%, severe LVH, Grade I DD     ***  4. CAD     ***  5. HTN     ***  6. Paroxysmal AFib     CHA2DS2Vasc is 5     Not on a/c given low burden     ***  Disposition: F/u with ***  Current medicines are reviewed at length with the patient today.  The patient did not have any concerns regarding medicines.***  Signed, Tommye Standard, PA-C 11/29/2019 6:18 PM     [address] [place] [address] [phone] (office)  [phone] (fax)

## 2019-12-01 ENCOUNTER — Encounter: Payer: Self-pay | Admitting: Physician Assistant

## 2019-12-21 ENCOUNTER — Telehealth (INDEPENDENT_AMBULATORY_CARE_PROVIDER_SITE_OTHER): Payer: Self-pay | Admitting: Internal Medicine

## 2019-12-21 ENCOUNTER — Other Ambulatory Visit: Payer: Self-pay

## 2019-12-21 ENCOUNTER — Other Ambulatory Visit: Payer: Self-pay | Admitting: Internal Medicine

## 2019-12-21 ENCOUNTER — Encounter: Payer: Self-pay | Admitting: Internal Medicine

## 2019-12-21 VITALS — Ht 68.0 in | Wt 155.0 lb

## 2019-12-21 DIAGNOSIS — I5042 Chronic combined systolic (congestive) and diastolic (congestive) heart failure: Secondary | ICD-10-CM

## 2019-12-21 DIAGNOSIS — I110 Hypertensive heart disease with heart failure: Secondary | ICD-10-CM

## 2019-12-21 DIAGNOSIS — I472 Ventricular tachycardia, unspecified: Secondary | ICD-10-CM

## 2019-12-21 DIAGNOSIS — I480 Paroxysmal atrial fibrillation: Secondary | ICD-10-CM

## 2019-12-21 DIAGNOSIS — I429 Cardiomyopathy, unspecified: Secondary | ICD-10-CM

## 2019-12-21 DIAGNOSIS — I255 Ischemic cardiomyopathy: Secondary | ICD-10-CM

## 2019-12-21 DIAGNOSIS — I10 Essential (primary) hypertension: Secondary | ICD-10-CM

## 2019-12-21 NOTE — Progress Notes (Signed)
Electrophysiology TeleHealth Note  Due to national recommendations of social distancing due to COVID 19, an audio telehealth visit is felt to be most appropriate for this patient at this time.  Verbal consent was obtained by me for the telehealth visit today.  The patient does not have capability for a virtual visit.  A phone visit is therefore required today.   Date:  12/21/2019   ID:  [name], DOB September 18, 1940, MRN [id number]  Location: patient's home  Provider location:  [address]  Evaluation Performed: Follow-up visit  PCP:  Patient, No Pcp Per   Electrophysiologist:  Dr Johney Frame  Chief Complaint:  ICD follow up  History of Present Illness:    [name] is a 79 y.o. male who presents via telehealth conferencing today.  Since last being seen in our clinic, the patient reports doing very well.  Today, he denies symptoms of palpitations, chest pain, shortness of breath,  lower extremity edema, dizziness, presyncope, or syncope.  The patient is otherwise without complaint today.  The patient denies symptoms of fevers, chills, cough, or new SOB worrisome for COVID 19. He has received both COVID vaccine doses.   Past Medical History:  Diagnosis Date  . Acute renal failure (HCC) 05/31/2014  . Angina decubitus (HCC) 06/15/2014  . CAD (coronary artery disease)    a. s/p stent RCA DES 9/09; b. 2014 Attempted PCI of OM1 @ [facility];  c. 04/2014 Cath: LAD 40-50p, D1 95-99 (chronic), LCX 30-40 inf branch, OM1 CTO, RCA 30-40p, RCA patent stent, EF 35%->Med Rx.  . Chronic combined systolic and diastolic CHF (congestive heart failure) (HCC)    a. EF about 40 to 45% per echo in April 2013;  b. 04/2014 Echo: EF 20-25%, sev LVH, sev glob HK, Gr 1 DD, mildly reduced RV fxn, PASP . c. 01/2017: EF improved to 50-55%.   . Chronic low back pain   . COPD (chronic obstructive pulmonary disease) (HCC) dx 06/2013   PFTs 07/08/13: mod obst with resp to bronchodilator, moderate decrease  diffusion, airtrapping  . COPD with acute exacerbation (HCC) 01/17/2017  . COPD with asthma (HCC)    07/08/13 PFT: FEV1 1.74L (66% pred, 30% change with BD), mod obst with resp to bronchodilator, moderate decrease diffusion, air-trapping 11/2013 Simple spiro>> clear obstruction, FEV1 1.30 L (47% pred) - trial of symbicort 160 2bid 01/26/15     . Depression 09/15/2018   Assessment: Increased sadness and depression since losing job  Plan: Patient denies suicidal and homicidal ideations Patient would not like to start medication therapies at this time Will establish patient with community health and wellness for primary care  . Dyslipidemia    a. on statin  . Essential hypertension   . HLD (hyperlipidemia)   . HTN (hypertension)    a. Reports intolerance to hydralazine; b. no beta blockers 2/2 bradycardia;  c. failed on ACE and ARB.  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 LBBB (left bundle branch block)   . LV dysfunction 12/13/2011  . LVH (left ventricular hypertrophy)   . Mixed Ischemic/Non-ischemic Cardiomyopathy    a. 04/2014 Echo: EF 20-25%, sev glob HK.  . Mixed Ischemic/Non-Ischemic Cardiomyopathy    probable mixed ischemic and non-ischemic   . Noncompliance   . NSVT (nonsustained ventricular tachycardia) (HCC)   . OSA (obstructive sleep apnea) 04/18/2018   06/11/16 - home sleep study shows AHI of 2.9 an hour with the lowest SaO2 of 79% with an average of 93%  03/08/2018-Home sleep study-AHI 7/HR, SaO2 low 81%   . Paroxysmal atrial fibrillation ([place])    a. identified on device interrogation 01/2016  . Pneumonia 09/02/2013  . Pulmonary edema 05/10/2014  . Respiratory arrest ([place]) 05/18/2014  . Sinus bradycardia 05/31/2014  . Syncope 05/30/2014    Past Surgical History:  Procedure Laterality Date  . BI-VENTRICULAR IMPLANTABLE CARDIOVERTER DEFIBRILLATOR N/A 08/12/2014   MDT [name] CRTD implanted by Dr Rayann Heman  . CARDIAC CATHETERIZATION     ejection fraction 50%  . LEFT HEART  CATHETERIZATION WITH CORONARY ANGIOGRAM N/A 05/08/2014   Procedure: LEFT HEART CATHETERIZATION WITH CORONARY ANGIOGRAM;  Surgeon: Blane Ohara, MD;  Location: [facility] CATH LAB;  Service: Cardiovascular;  Laterality: N/A;    Current Outpatient Medications  Medication Sig Dispense Refill  . albuterol (PROVENTIL HFA;VENTOLIN HFA) 108 (90 Base) MCG/ACT inhaler Inhale 1 puff into the lungs every 6 (six) hours as needed for wheezing or shortness of breath. 1 Inhaler 5  . albuterol (PROVENTIL) (2.5 MG/3ML) 0.083% nebulizer solution Take 3 mLs (2.5 mg total) by nebulization every 6 (six) hours as needed for wheezing or shortness of breath. 360 mL 11  . amLODipine (NORVASC) 5 MG tablet Take 1 tablet (5 mg total) by mouth daily. 270 tablet 3  . atorvastatin (LIPITOR) 20 MG tablet Take 1 tablet (20 mg total) by mouth daily. 90 tablet 2  . budesonide-formoterol (SYMBICORT) 160-4.5 MCG/ACT inhaler TAKE 2 PUFFS BY MOUTH TWICE A DAY 30.6 Inhaler 3  . clopidogrel (PLAVIX) 75 MG tablet TAKE 1 TABLET BY MOUTH EVERY DAY 90 tablet 1  . isosorbide mononitrate (IMDUR) 60 MG 24 hr tablet TAKE 1 TABLET (60 MG TOTAL) BY MOUTH DAILY. 90 tablet 2  . losartan (COZAAR) 100 MG tablet Take 1 tablet (100 mg total) by mouth daily. 90 tablet 3  . metoprolol tartrate (LOPRESSOR) 50 MG tablet TAKE 1 TABLET BY MOUTH TWICE A DAY 180 tablet 0  . potassium chloride SA (K-DUR,KLOR-CON) 20 MEQ tablet Take 40 mEq by mouth daily.    . traMADol (ULTRAM) 50 MG tablet Take 1 tablet (50 mg total) by mouth every 6 (six) hours as needed. 10 tablet 0  . nitroGLYCERIN (NITROSTAT) 0.4 MG SL tablet Place 1 tablet (0.4 mg total) under the tongue every 5 (five) minutes as needed for chest pain. 25 tablet 3   No current facility-administered medications for this visit.    Allergies:   Acyclovir and related and Aspirin   Social History:  The patient  reports that he quit smoking about 7 years ago. His smoking use included cigarettes. He has a 28.00  pack-year smoking history. He has never used smokeless tobacco. He reports that he does not drink alcohol or use drugs.   ROS:  Please see the history of present illness.   All other systems are personally reviewed and negative.    Exam:    Vital Signs:  Ht 5\' 8"  (1.727 m)   Wt 155 lb (70.3 kg)   BMI 23.57 kg/m   Well sounding, alert and conversant   Labs/Other Tests and Data Reviewed:    Recent Labs: No results found for requested labs within last 8760 hours.   Wt Readings from Last 3 Encounters:  12/21/19 155 lb (70.3 kg)  11/17/18 156 lb (70.8 kg)  09/15/18 157 lb 6.4 oz (71.4 kg)     Last device remote is reviewed from [place] PDF which reveals normal device function   ASSESSMENT & PLAN:    1.  Chronic systolic heart failure/ VT/ ICM Euvolemic by symptoms today Normal ICD function See recent [employer] report Continue current medications No recent sustained VT episodes He had an experience where he was working within 5 feet of a metal detector and started having tingling in his legs and was concerned it was 2/2 ICD. Reassured today that this was not related to ICD. Ok to work around [employer] as long as he stays at least 1 foot away.   2.  HTN Stable No change required today  3.  COPD Chronic shortness of breath Continue follow up with pulmonary  4.  Paroxysmal atrial fibrillation Burden by device interrogation <0.1% CHADS2VASC is 5 - will consider anticoagulation if AF burden increases    Follow-up:  [facility] and with me in a year   Patient Risk:  after full review of this patients clinical status, I feel that they are at moderate risk at this time.  Today, I have spent 15 minutes with the patient with telehealth technology discussing arrhythmia management .    Randolm Idol, MD  12/21/2019 3:51 PM     [facility] [address] [place] [address] [phone] (office) [phone] (fax)

## 2019-12-25 ENCOUNTER — Other Ambulatory Visit: Payer: Self-pay | Admitting: Cardiology

## 2020-01-05 ENCOUNTER — Ambulatory Visit (INDEPENDENT_AMBULATORY_CARE_PROVIDER_SITE_OTHER): Payer: Self-pay | Admitting: *Deleted

## 2020-01-05 DIAGNOSIS — I429 Cardiomyopathy, unspecified: Secondary | ICD-10-CM

## 2020-01-06 LAB — CUP PACEART REMOTE DEVICE CHECK
Battery Remaining Longevity: 16 mo
Battery Voltage: 2.89 V
Brady Statistic AP VP Percent: 66.2 %
Brady Statistic AP VS Percent: 0.1 %
Brady Statistic AS VP Percent: 32.47 %
Brady Statistic AS VS Percent: 1.24 %
Brady Statistic RA Percent Paced: 62.91 %
Brady Statistic RV Percent Paced: 96.76 %
Date Time Interrogation Session: 20210427224232
HighPow Impedance: 65 Ohm
Implantable Lead Implant Date: 20151203
Implantable Lead Implant Date: 20151203
Implantable Lead Implant Date: 20151203
Implantable Lead Location: 753858
Implantable Lead Location: 753859
Implantable Lead Location: 753860
Implantable Lead Model: 4598
Implantable Lead Model: 5076
Implantable Lead Model: 6935
Implantable Pulse Generator Implant Date: 20151203
Lead Channel Impedance Value: 323 Ohm
Lead Channel Impedance Value: 323 Ohm
Lead Channel Impedance Value: 323 Ohm
Lead Channel Impedance Value: 323 Ohm
Lead Channel Impedance Value: 323 Ohm
Lead Channel Impedance Value: 342 Ohm
Lead Channel Impedance Value: 342 Ohm
Lead Channel Impedance Value: 399 Ohm
Lead Channel Impedance Value: 532 Ohm
Lead Channel Impedance Value: 570 Ohm
Lead Channel Impedance Value: 570 Ohm
Lead Channel Impedance Value: 570 Ohm
Lead Channel Impedance Value: 570 Ohm
Lead Channel Pacing Threshold Amplitude: 0.625 V
Lead Channel Pacing Threshold Amplitude: 1 V
Lead Channel Pacing Threshold Amplitude: 1.25 V
Lead Channel Pacing Threshold Pulse Width: 0.4 ms
Lead Channel Pacing Threshold Pulse Width: 0.4 ms
Lead Channel Pacing Threshold Pulse Width: 0.4 ms
Lead Channel Sensing Intrinsic Amplitude: 1 mV
Lead Channel Sensing Intrinsic Amplitude: 1 mV
Lead Channel Sensing Intrinsic Amplitude: 19.875 mV
Lead Channel Sensing Intrinsic Amplitude: 19.875 mV
Lead Channel Setting Pacing Amplitude: 1.5 V
Lead Channel Setting Pacing Amplitude: 2 V
Lead Channel Setting Pacing Amplitude: 2.5 V
Lead Channel Setting Pacing Pulse Width: 0.4 ms
Lead Channel Setting Pacing Pulse Width: 0.4 ms
Lead Channel Setting Sensing Sensitivity: 0.3 mV

## 2020-01-06 NOTE — Progress Notes (Signed)
ICD Remote  

## 2020-01-07 NOTE — Progress Notes (Signed)
[name] Date of Birth: 1940/10/18 Medical Record #[id number]  History of Present Illness: [name] is seen for follow up of CAD and CHF. He has multiple medical issues which include HTN,  HLD and known CAD. He has a history of tobacco abuse. He has LV dysfunction with ejection fraction of 40-45% in April 2013.  Not able to take higher beta blocker dose due to his resting bradycardia.  He had a Myoview study in March of 2013 which showed normal perfusion. Ejection fraction of 40%. He was admitted to [facility] in December 2014 with chest pain. Cardiac cath showed patent RCA stent. There was a 50% mid LAD lesion. The first diagonal and first OM were occluded. Attempted PCI of the OM but unable to cross with wire. Review of old films here showed that this was a chronic obstruction. He has a history of NSVT. He was admitted in  August 2015 with VDRF with acute pulmonary edema. Echo showed drop in EF to 20-25%. In September 2015 he was admitted with syncope associated with bradycardia and overdiuresis. Diruetics were reduced and coreg was held. Repeat Echo in November 2015 showed EF of 30%. He did undergo ICD/BiV implant  by Dr. Johney Frame.  In 2017  he complained of dyspnea on exertion. No clinical evidence of CHF. Echo showed EF 45-50%. Global HK. Mild MR. Myoview study showed a fixed inferolateral infarct without ischemia. EF 39%. He ultimately underwent CPX. This was limited by submaximal exercise and severe hypertensive response. There was mild COPD. Exercise training and improved BP control recommended. Toprol was increased.   Last general cardiology follow up was in 2018. Followed regularly in EP clinic for his ICD. Last Echo in 2019 showed EF 45% with moderate LVH  When seen today for follow up he complains of increased SOB over the past 3 months. 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 No edema. The pollen does bother him a lot. No  chest pain. He is no longer smoking. Notes when he lies on his right side he gets nightmares. Uses oxygen at home 3-4 hours a night. Weight is down 6 lbs this year.     Current Outpatient Medications on File Prior to Visit  Medication Sig Dispense Refill  . albuterol (PROVENTIL HFA;VENTOLIN HFA) 108 (90 Base) MCG/ACT inhaler Inhale 1 puff into the lungs every 6 (six) hours as needed for wheezing or shortness of breath. 1 Inhaler 5  . albuterol (PROVENTIL) (2.5 MG/3ML) 0.083% nebulizer solution Take 3 mLs (2.5 mg total) by nebulization every 6 (six) hours as needed for wheezing or shortness of breath. 360 mL 11  . amLODipine (NORVASC) 5 MG tablet TAKE 1 TABLET BY MOUTH EVERY DAY 90 tablet 3  . atorvastatin (LIPITOR) 20 MG tablet Take 1 tablet (20 mg total) by mouth daily. 90 tablet 2  . budesonide-formoterol (SYMBICORT) 160-4.5 MCG/ACT inhaler TAKE 2 PUFFS BY MOUTH TWICE A DAY 30.6 Inhaler 3  . clopidogrel (PLAVIX) 75 MG tablet TAKE 1 TABLET BY MOUTH EVERY DAY 90 tablet 3  . isosorbide mononitrate (IMDUR) 60 MG 24 hr tablet TAKE 1 TABLET (60 MG TOTAL) BY MOUTH DAILY. 90 tablet 2  . losartan (COZAAR) 100 MG tablet Take 1 tablet (100 mg total) by mouth daily. 90 tablet 3  . metoprolol tartrate (LOPRESSOR) 50 MG tablet TAKE 1 TABLET BY MOUTH TWICE A DAY 180 tablet 0  . potassium chloride SA (K-DUR,KLOR-CON)  20 MEQ tablet Take 40 mEq by mouth daily.    . traMADol (ULTRAM) 50 MG tablet Take 1 tablet (50 mg total) by mouth every 6 (six) hours as needed. 10 tablet 0  . nitroGLYCERIN (NITROSTAT) 0.4 MG SL tablet Place 1 tablet (0.4 mg total) under the tongue every 5 (five) minutes as needed for chest pain. 25 tablet 3   No current facility-administered medications on file prior to visit.    Allergies  Allergen Reactions  . Acyclovir And Related     unknown  . Aspirin Other (See Comments)    GI upset at high doses only.    Past Medical History:  Diagnosis Date  . Acute renal failure (HCC)  05/31/2014  . Angina decubitus (HCC) 06/15/2014  . CAD (coronary artery disease)    a. s/p stent RCA DES 9/09; b. 2014 Attempted PCI of OM1 @ [facility];  c. 04/2014 Cath: LAD 40-50p, D1 95-99 (chronic), LCX 30-40 inf branch, OM1 CTO, RCA 30-40p, RCA patent stent, EF 35%->Med Rx.  . Chronic combined systolic and diastolic CHF (congestive heart failure) (HCC)    a. EF about 40 to 45% per echo in April 2013;  b. 04/2014 Echo: EF 20-25%, sev LVH, sev glob HK, Gr 1 DD, mildly reduced RV fxn, PASP . c. 01/2017: EF improved to 50-55%.   . Chronic low back pain   . COPD (chronic obstructive pulmonary disease) (HCC) dx 06/2013   PFTs 07/08/13: mod obst with resp to bronchodilator, moderate decrease diffusion, airtrapping  . COPD with acute exacerbation (HCC) 01/17/2017  . COPD with asthma (HCC)    07/08/13 PFT: FEV1 1.74L (66% pred, 30% change with BD), mod obst with resp to bronchodilator, moderate decrease diffusion, air-trapping 11/2013 Simple spiro>> clear obstruction, FEV1 1.30 L (47% pred) - trial of symbicort 160 2bid 01/26/15     . Depression 09/15/2018   Assessment: Increased sadness and depression since losing job  Plan: Patient denies suicidal and homicidal ideations Patient would not like to start medication therapies at this time Will establish patient with community health and wellness for primary care  . Dyslipidemia    a. on statin  . Essential hypertension   . HLD (hyperlipidemia)   . HTN (hypertension)    a. Reports intolerance to hydralazine; b. no beta blockers 2/2 bradycardia;  c. failed on ACE and ARB.  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 LBBB (left bundle branch block)   . LV dysfunction 12/13/2011  . LVH (left ventricular hypertrophy)   . Mixed Ischemic/Non-ischemic Cardiomyopathy    a. 04/2014 Echo: EF 20-25%, sev glob HK.  . Mixed Ischemic/Non-Ischemic Cardiomyopathy    probable mixed ischemic and non-ischemic   . Noncompliance   . NSVT (nonsustained ventricular  tachycardia) (HCC)   . OSA (obstructive sleep apnea) 04/18/2018   06/11/16 - home sleep study shows AHI of 2.9 an hour with the lowest SaO2 of 79% with an average of 93%  03/08/2018-Home sleep study-AHI 7/HR, SaO2 low 81%   . Paroxysmal atrial fibrillation (HCC)    a. identified on device interrogation 01/2016  . Pneumonia 09/02/2013  . Pulmonary edema 05/10/2014  . Respiratory arrest (HCC) 05/18/2014  . Sinus bradycardia 05/31/2014  . Syncope 05/30/2014    Past Surgical History:  Procedure Laterality Date  . BI-VENTRICULAR IMPLANTABLE CARDIOVERTER DEFIBRILLATOR N/A 08/12/2014   MDT Ovidio Kin XT CRTD implanted by Dr Johney Frame  . CARDIAC CATHETERIZATION     ejection fraction 50%  . LEFT HEART CATHETERIZATION WITH  CORONARY ANGIOGRAM N/A 05/08/2014   Procedure: LEFT HEART CATHETERIZATION WITH CORONARY ANGIOGRAM;  Surgeon: Blane Ohara, MD;  Location: [facility] CATH LAB;  Service: Cardiovascular;  Laterality: N/A;    Social History   Tobacco Use  Smoking Status Former Smoker  . Packs/day: 0.50  . Years: 56.00  . Pack years: 28.00  . Types: Cigarettes  . Quit date: 09/10/2012  . Years since quitting: 7.3  Smokeless Tobacco Never Used  Tobacco Comment   tried 1 month ago (05/2018) but didn't like it     Social History   Substance and Sexual Activity  Alcohol Use No    Family History  Problem Relation Age of Onset  . Heart attack Neg Hx   . Stroke Neg Hx     Review of Systems: As noted in history of present illness  All other systems were reviewed and are negative.  Physical Exam: BP (!) 158/79   Pulse 79   Ht 5\' 8"  (1.727 m)   Wt 151 lb 9.6 oz (68.8 kg)   SpO2 98%   BMI 23.05 kg/m  GENERAL:  Well appearing BM in NAD HEENT:  PERRL, EOMI, sclera are clear. Oropharynx is clear. NECK:  No jugular venous distention, carotid upstroke brisk and symmetric, no bruits, no thyromegaly or adenopathy LUNGS:  Clear to auscultation bilaterally CHEST:  Unremarkable HEART:  RRR,  PMI not  displaced or sustained,S1 and S2 within normal limits, no S3, no S4: no clicks, no rubs, no murmurs ABD:  Soft, nontender. BS +, no masses or bruits. No hepatomegaly, no splenomegaly EXT:  2 + pulses throughout, no edema, no cyanosis no clubbing SKIN:  Warm and dry.  No rashes NEURO:  Alert and oriented x 3. Cranial nerves II through XII intact. PSYCH:  Cognitively intact     LABORATORY DATA:   Lab Results  Component Value Date   WBC 5.9 06/20/2018   HGB 13.0 06/20/2018   HCT 38.9 06/20/2018   PLT 203 06/20/2018   GLUCOSE 87 06/20/2018   CHOL 202 (H) 11/29/2015   TRIG 86 11/29/2015   HDL 70 11/29/2015   LDLDIRECT 132.9 06/18/2013   LDLCALC 115 11/29/2015   ALT 9 02/11/2018   AST 14 02/11/2018   NA 143 06/20/2018   K 3.9 06/20/2018   CL 103 06/20/2018   CREATININE 0.91 06/20/2018   BUN 8 06/20/2018   CO2 24 06/20/2018   TSH 2.67 01/31/2016   INR 1.09 06/20/2014    Ecg today shows AV pacing BI V rate 66. I have personally reviewed and interpreted this study.  Echo: 02/20/16:Study Conclusions  - Left ventricle: There was moderate concentric hypertrophy.   Systolic function was mildly reduced. The estimated ejection   fraction was in the range of 45% to 50%. Mild diffuse hypokinesis   with no identifiable regional variations. Abnormal relaxation   with increased filling pressures. - Ventricular septum: Septal motion showed paradox. These changes   are consistent with right ventricular pacing. - Mitral valve: There was mild regurgitation  Myoview 02/20/16: Study Highlights    Nuclear stress EF: 39%. Diffuse hypokinesis with asynchronous contraction consistent with pacemaker  There was no ST segment deviation noted during stress.  Defect 1: There is a small defect of moderate severity present in the mid inferolateral location. Question possible focal lateral infarction.  Findings consistent with prior myocardial infarction. No areas of ischemia identified.  This  is an intermediate risk study.   Candee Furbish, MD   CPX: 07/05/16:  Narrative   Referred for: Exertional Dyspnea   Procedure: This patient underwent staged symptom-limited exercise treadmill testing using a modified Naughton protocol with expired gas analysis metabolic evaluation during exercise.  Demographics  Age: 79 Ht. (in.) 68.5 Wt. (lb) 151 BMI: 22.6   Predicted Peak VO2: 26.6  Gender: Male Ht (cm) 174 Wt. (kg) 68.5    Results  Pre-Exercise PFTs   FVC 2.70 (81%)     FEV1 1.64 (64%)      FEV1/FVC 61 (79%)      MVV 70 (58%)      Exercise Time:  9:06  Speed (mph): 2.0    Grade (%): 10.5    RPE: 17  Reason stopped: Test was ended due to hypertensive response achieving termination criteria prior to maximal exercise.  Additional symptoms: bilateral hip pain (5/10) dyspnea (3/10)  Resting HR: 65 Peak HR: 127  (86% age predicted max HR)  BP rest: 180/66 BP peak: 246/94  Peak VO2: 17.5 (66% predicted peak VO2)  VE/VCO2 slope: 33.4  OUES: 1.94  Peak RER: 0.98  Ventilatory Threshold: 16.5 (62% predicted or measured peak VO2)  Peak RR 34  Peak Ventilation: 44.3  VE/MVV: 63%  PETCO2 at peak: 31  O2pulse: 9  (75% predicted O2pulse)   Interpretation  Notes: Patient gave a very good effort. Pulse-oximetry remained 96% or above for the duration of exercise.   ECG: Resting ECG AV paced rhythm. HR response appropriate. PVCs occasional. There were no sustained arrhythmias and no ST-T changes. BP hypertensive at rest with increasingly significant hypertensive response to incremental exercise.  PFT: Pre-exercise spirometry suggests possible mild restrictive/obstructive patterns. MVV below normal.   CPX: Exercise testing with gas exchange demonstrates a moderately reduced peak VO2 of 17.5 ml/kg/min (66% of the age/gender/weight matched sedentary norms). The RER of 0.98 indicates a submaximal effort. The VE/VCO2 slope is mildly  elevated and indicates increased dead space ventiltion. The oxygen uptake efficiency slope (OUES) is moderately reduced. The VO2 at the ventilatory threshold was normal at 62% of the predicted peak VO2. At peak exercise, the ventilation reached 63% of the measured MVV indicating ventilatory reserve remained. The O2pulse (a surrogate for stroke volume) increased with initial exercise but remained flat at 9 ml/beeat (75% predicted).   Conclusion: The interpretation of this test is limited due to submaximal effort during the exercise. Based on available data, exercise testing with gas exchange demonstrates moderate functional impairment. Pre-exercise spirometry suggests mild obstructive pattern. With significantly hypertensive response to exercise, flat O2 pulse and elevated VE/VCO2 slope patient is most likely limited due to diastolic dysfunction and possible early pulmonary vascular disease.    Test, report and preliminary impression by: Lesia Hausen, MS, ACSM-RCEP 07/05/2016 9:46 AM  Preliminary CPX Results, Finalized results will be forwarded when completed by interpreting physician.  Agree with above. This is submax test with mild functional limitation that is multifactorial in nature including COPD, deconditioning and severe hypertensive response with mild circulatory limitation. Would consider exercise training program and tighter BP control.   Bensimhon, Daniel,MD 10:24 PM    Echo 08/04/18: Study Conclusions  - Left ventricle: The cavity size was normal. There was severe   concentric hypertrophy. Doppler parameters are consistent with   abnormal left ventricular relaxation (grade 1 diastolic   dysfunction). - Mitral valve: There was mild to moderate regurgitation. - Right ventricle: Systolic function was mildly reduced. - Pericardium, extracardiac: A trivial pericardial effusion was   identified.  Assessment / Plan: 1. Chronic systolic and diastolic  CHF with mixed  cardiomyopathy. Last EF about 45%. Volume status looks good. I suspect his DOE is a mixed picture with CHF, COPD, allergies. Will update lab work. Check CXR and Echo. Check BNP. Continue current therapy pending these results.     2. Coronary disease with remote stenting of the right coronary in 2009 with a drug-eluting stent. Chronic occlusion of OM. He has no significant angina. Myoview study 2017 showed no ischemia.  3. Tobacco abuse. States his is no longer smoking.continue Symbicort for COPD  4. Hyperlipidemia  on Lipitor. Check fasting lab.  5. COPD with asthma.  6. HTN   7. NSVT s/p ICD. Asymptomatic.  8. S/p ICD/BiV pacemaker.    Follow up in 6 months.

## 2020-01-11 ENCOUNTER — Encounter: Payer: Self-pay | Admitting: Cardiology

## 2020-01-11 ENCOUNTER — Ambulatory Visit (INDEPENDENT_AMBULATORY_CARE_PROVIDER_SITE_OTHER): Payer: Self-pay | Admitting: Cardiology

## 2020-01-11 ENCOUNTER — Other Ambulatory Visit: Payer: Self-pay

## 2020-01-11 VITALS — BP 158/79 | HR 79 | Ht 68.0 in | Wt 151.6 lb

## 2020-01-11 DIAGNOSIS — I447 Left bundle-branch block, unspecified: Secondary | ICD-10-CM

## 2020-01-11 DIAGNOSIS — I472 Ventricular tachycardia, unspecified: Secondary | ICD-10-CM

## 2020-01-11 DIAGNOSIS — J449 Chronic obstructive pulmonary disease, unspecified: Secondary | ICD-10-CM

## 2020-01-11 DIAGNOSIS — I10 Essential (primary) hypertension: Secondary | ICD-10-CM

## 2020-01-11 DIAGNOSIS — I255 Ischemic cardiomyopathy: Secondary | ICD-10-CM

## 2020-01-11 DIAGNOSIS — I5042 Chronic combined systolic (congestive) and diastolic (congestive) heart failure: Secondary | ICD-10-CM

## 2020-01-11 MED ORDER — LOSARTAN POTASSIUM 100 MG PO TABS: 100.0000 mg | ORAL_TABLET | Freq: Every day | ORAL | 3 refills | Status: DC

## 2020-01-11 MED ORDER — ISOSORBIDE MONONITRATE ER 60 MG PO TB24: 60.0000 mg | ORAL_TABLET | Freq: Every day | ORAL | 2 refills | Status: DC

## 2020-01-11 MED ORDER — METOPROLOL TARTRATE 50 MG PO TABS: 50.0000 mg | ORAL_TABLET | Freq: Two times a day (BID) | ORAL | 3 refills | Status: DC

## 2020-01-11 MED ORDER — ATORVASTATIN CALCIUM 20 MG PO TABS: 20.0000 mg | ORAL_TABLET | Freq: Every day | ORAL | 2 refills | Status: DC

## 2020-01-11 NOTE — Addendum Note (Signed)
Addended by: Neoma Laming on: 01/11/2020 05:02 PM   Modules accepted: Orders

## 2020-01-11 NOTE — Patient Instructions (Signed)
We will  Update lab work today  We will also check a Chest X ray and Echo cardiogram  Continue your current therapy

## 2020-01-12 ENCOUNTER — Ambulatory Visit
Admission: RE | Admit: 2020-01-12 | Discharge: 2020-01-12 | Disposition: A | Discharge status: Home / Self Care | Payer: Self-pay | Source: Ambulatory Visit | Attending: Cardiology | Admitting: Cardiology

## 2020-01-12 DIAGNOSIS — I472 Ventricular tachycardia, unspecified: Secondary | ICD-10-CM

## 2020-01-12 DIAGNOSIS — I447 Left bundle-branch block, unspecified: Secondary | ICD-10-CM

## 2020-01-12 DIAGNOSIS — I255 Ischemic cardiomyopathy: Secondary | ICD-10-CM

## 2020-01-12 DIAGNOSIS — I5042 Chronic combined systolic (congestive) and diastolic (congestive) heart failure: Secondary | ICD-10-CM

## 2020-01-12 DIAGNOSIS — J449 Chronic obstructive pulmonary disease, unspecified: Secondary | ICD-10-CM

## 2020-01-12 DIAGNOSIS — I10 Essential (primary) hypertension: Secondary | ICD-10-CM

## 2020-01-13 LAB — CBC WITH DIFFERENTIAL/PLATELET
Basophils Absolute: 0 10*3/uL (ref 0.0–0.2)
Basos: 0 %
EOS (ABSOLUTE): 0.1 10*3/uL (ref 0.0–0.4)
Eos: 1 %
Hematocrit: 39.5 % (ref 37.5–51.0)
Hemoglobin: 13.2 g/dL (ref 13.0–17.7)
Immature Grans (Abs): 0 10*3/uL (ref 0.0–0.1)
Immature Granulocytes: 0 %
Lymphocytes Absolute: 2.5 10*3/uL (ref 0.7–3.1)
Lymphs: 49 %
MCH: 28.5 pg (ref 26.6–33.0)
MCHC: 33.4 g/dL (ref 31.5–35.7)
MCV: 85 fL (ref 79–97)
Monocytes Absolute: 0.5 10*3/uL (ref 0.1–0.9)
Monocytes: 9 %
Neutrophils Absolute: 2.2 10*3/uL (ref 1.4–7.0)
Neutrophils: 41 %
Platelets: 169 10*3/uL (ref 150–450)
RBC: 4.63 x10E6/uL (ref 4.14–5.80)
RDW: 13.2 % (ref 11.6–15.4)
WBC: 5.3 10*3/uL (ref 3.4–10.8)

## 2020-01-13 LAB — BASIC METABOLIC PANEL
BUN/Creatinine Ratio: 16 (ref 10–24)
BUN: 15 mg/dL (ref 8–27)
CO2: 26 mmol/L (ref 20–29)
Calcium: 9.6 mg/dL (ref 8.6–10.2)
Chloride: 107 mmol/L — ABNORMAL HIGH (ref 96–106)
Creatinine, Ser: 0.95 mg/dL (ref 0.76–1.27)
GFR calc Af Amer: 90 mL/min/{1.73_m2} (ref >59)
GFR calc non Af Amer: 77 mL/min/{1.73_m2} (ref >59)
Glucose: 91 mg/dL (ref 65–99)
Potassium: 4.4 mmol/L (ref 3.5–5.2)
Sodium: 147 mmol/L — ABNORMAL HIGH (ref 134–144)

## 2020-01-13 LAB — LIPID PANEL
Chol/HDL Ratio: 3.4 ratio (ref 0.0–5.0)
Cholesterol, Total: 216 mg/dL — ABNORMAL HIGH (ref 100–199)
HDL: 64 mg/dL (ref >39)
LDL Chol Calc (NIH): 135 mg/dL — ABNORMAL HIGH (ref 0–99)
Triglycerides: 96 mg/dL (ref 0–149)
VLDL Cholesterol Cal: 17 mg/dL (ref 5–40)

## 2020-01-13 LAB — HEPATIC FUNCTION PANEL
ALT: 12 IU/L (ref 0–44)
AST: 21 IU/L (ref 0–40)
Albumin: 4.8 g/dL — ABNORMAL HIGH (ref 3.7–4.7)
Alkaline Phosphatase: 92 IU/L (ref 39–117)
Bilirubin Total: 0.2 mg/dL (ref 0.0–1.2)
Bilirubin, Direct: 0.09 mg/dL (ref 0.00–0.40)
Total Protein: 7.5 g/dL (ref 6.0–8.5)

## 2020-01-13 LAB — BRAIN NATRIURETIC PEPTIDE: BNP: 118.6 pg/mL — ABNORMAL HIGH (ref 0.0–100.0)

## 2020-01-17 ENCOUNTER — Other Ambulatory Visit: Payer: Self-pay | Admitting: Pulmonary Disease

## 2020-01-17 DIAGNOSIS — J449 Chronic obstructive pulmonary disease, unspecified: Secondary | ICD-10-CM

## 2020-01-18 ENCOUNTER — Telehealth: Payer: Self-pay | Admitting: Cardiology

## 2020-01-18 DIAGNOSIS — J449 Chronic obstructive pulmonary disease, unspecified: Secondary | ICD-10-CM

## 2020-01-18 DIAGNOSIS — E785 Hyperlipidemia, unspecified: Secondary | ICD-10-CM

## 2020-01-18 DIAGNOSIS — I10 Essential (primary) hypertension: Secondary | ICD-10-CM

## 2020-01-18 MED ORDER — BUDESONIDE-FORMOTEROL FUMARATE 160-4.5 MCG/ACT IN AERO: INHALATION_SPRAY | RESPIRATORY_TRACT | 0 refills | Status: DC

## 2020-01-18 NOTE — Telephone Encounter (Signed)
[name] is returning Cheryl's call in regards to results. Please advise.

## 2020-01-18 NOTE — Telephone Encounter (Signed)
Spoke to patient recent lab results given. 

## 2020-01-28 ENCOUNTER — Ambulatory Visit (HOSPITAL_COMMUNITY): Payer: Self-pay | Attending: Cardiovascular Disease

## 2020-01-28 ENCOUNTER — Other Ambulatory Visit: Payer: Self-pay

## 2020-01-28 DIAGNOSIS — I10 Essential (primary) hypertension: Secondary | ICD-10-CM | POA: Insufficient documentation

## 2020-01-28 DIAGNOSIS — I472 Ventricular tachycardia, unspecified: Secondary | ICD-10-CM

## 2020-01-28 DIAGNOSIS — I447 Left bundle-branch block, unspecified: Secondary | ICD-10-CM | POA: Insufficient documentation

## 2020-01-28 DIAGNOSIS — I5042 Chronic combined systolic (congestive) and diastolic (congestive) heart failure: Secondary | ICD-10-CM | POA: Insufficient documentation

## 2020-01-28 DIAGNOSIS — I255 Ischemic cardiomyopathy: Secondary | ICD-10-CM | POA: Insufficient documentation

## 2020-01-28 DIAGNOSIS — J449 Chronic obstructive pulmonary disease, unspecified: Secondary | ICD-10-CM | POA: Insufficient documentation

## 2020-04-05 ENCOUNTER — Ambulatory Visit (INDEPENDENT_AMBULATORY_CARE_PROVIDER_SITE_OTHER): Payer: Self-pay | Admitting: *Deleted

## 2020-04-05 DIAGNOSIS — I429 Cardiomyopathy, unspecified: Secondary | ICD-10-CM

## 2020-04-06 LAB — CUP PACEART REMOTE DEVICE CHECK
Battery Remaining Longevity: 13 mo
Battery Voltage: 2.87 V
Brady Statistic AP VP Percent: 67.21 %
Brady Statistic AP VS Percent: 0.06 %
Brady Statistic AS VP Percent: 32.37 %
Brady Statistic AS VS Percent: 0.36 %
Brady Statistic RA Percent Paced: 63.26 %
Brady Statistic RV Percent Paced: 98.5 %
Date Time Interrogation Session: 20210727164227
HighPow Impedance: 67 Ohm
Implantable Lead Implant Date: 20151203
Implantable Lead Implant Date: 20151203
Implantable Lead Implant Date: 20151203
Implantable Lead Location: 753858
Implantable Lead Location: 753859
Implantable Lead Location: 753860
Implantable Lead Model: 4598
Implantable Lead Model: 5076
Implantable Lead Model: 6935
Implantable Pulse Generator Implant Date: 20151203
Lead Channel Impedance Value: 304 Ohm
Lead Channel Impedance Value: 323 Ohm
Lead Channel Impedance Value: 342 Ohm
Lead Channel Impedance Value: 342 Ohm
Lead Channel Impedance Value: 342 Ohm
Lead Channel Impedance Value: 342 Ohm
Lead Channel Impedance Value: 380 Ohm
Lead Channel Impedance Value: 399 Ohm
Lead Channel Impedance Value: 570 Ohm
Lead Channel Impedance Value: 570 Ohm
Lead Channel Impedance Value: 570 Ohm
Lead Channel Impedance Value: 570 Ohm
Lead Channel Impedance Value: 589 Ohm
Lead Channel Pacing Threshold Amplitude: 0.625 V
Lead Channel Pacing Threshold Amplitude: 1.125 V
Lead Channel Pacing Threshold Amplitude: 1.375 V
Lead Channel Pacing Threshold Pulse Width: 0.4 ms
Lead Channel Pacing Threshold Pulse Width: 0.4 ms
Lead Channel Pacing Threshold Pulse Width: 0.4 ms
Lead Channel Sensing Intrinsic Amplitude: 1.125 mV
Lead Channel Sensing Intrinsic Amplitude: 1.125 mV
Lead Channel Sensing Intrinsic Amplitude: 27.125 mV
Lead Channel Sensing Intrinsic Amplitude: 27.125 mV
Lead Channel Setting Pacing Amplitude: 1.5 V
Lead Channel Setting Pacing Amplitude: 2.25 V
Lead Channel Setting Pacing Amplitude: 3 V
Lead Channel Setting Pacing Pulse Width: 0.4 ms
Lead Channel Setting Pacing Pulse Width: 0.4 ms
Lead Channel Setting Sensing Sensitivity: 0.3 mV

## 2020-04-11 NOTE — Progress Notes (Signed)
Remote ICD transmission.   

## 2020-04-24 ENCOUNTER — Other Ambulatory Visit: Payer: Self-pay | Admitting: Cardiology

## 2020-04-24 DIAGNOSIS — J4489 Other specified chronic obstructive pulmonary disease: Secondary | ICD-10-CM

## 2020-04-24 DIAGNOSIS — J449 Chronic obstructive pulmonary disease, unspecified: Secondary | ICD-10-CM

## 2020-08-17 ENCOUNTER — Other Ambulatory Visit: Payer: Self-pay | Admitting: Cardiology

## 2020-08-17 DIAGNOSIS — J449 Chronic obstructive pulmonary disease, unspecified: Secondary | ICD-10-CM

## 2020-08-25 ENCOUNTER — Telehealth: Payer: Self-pay | Admitting: Internal Medicine

## 2020-08-25 NOTE — Telephone Encounter (Signed)
Returning patients phone call.  No answer, LMOVM.  

## 2020-08-25 NOTE — Telephone Encounter (Signed)
Patient states that he is having some left shoulder numbness. He wants to know if this is related to the defibrillator. He denies any symptoms. States he did some pushup's last night and did seem to ease the numbness. Please advise.

## 2020-08-26 NOTE — Telephone Encounter (Signed)
Returning call. LMOM with DC # and office hours.

## 2020-08-29 NOTE — Telephone Encounter (Signed)
Called patient to assess. No answer, LMOVM.  

## 2020-08-29 NOTE — Telephone Encounter (Signed)
Certified letter sent 

## 2020-09-06 ENCOUNTER — Telehealth: Payer: Self-pay

## 2020-09-06 NOTE — Telephone Encounter (Signed)
Patient called in and is having issues with the home monitor. Patient will be sent out a new reader and it will arrive 7-10 days and patient will call in when he receives it.

## 2020-09-06 NOTE — Telephone Encounter (Signed)
Patient needs an appointment for in office clinic with [facility] he has been having issues with his monitor and they are sending him a new one and it will take 7-10 business days. Patient has been having numbness in his arm and needs a appointment

## 2020-09-12 NOTE — Telephone Encounter (Signed)
The patient states in a voicemail that he took some over the counter meds and do not need to come in to see Dr. Johney Frame.

## 2020-10-04 ENCOUNTER — Ambulatory Visit (INDEPENDENT_AMBULATORY_CARE_PROVIDER_SITE_OTHER): Payer: Medicare Other

## 2020-10-04 DIAGNOSIS — I429 Cardiomyopathy, unspecified: Secondary | ICD-10-CM

## 2020-10-05 ENCOUNTER — Telehealth: Payer: Self-pay

## 2020-10-05 LAB — CUP PACEART REMOTE DEVICE CHECK
Battery Remaining Longevity: 6 mo
Battery Voltage: 2.83 V
Brady Statistic AP VP Percent: 75.02 %
Brady Statistic AP VS Percent: 0.06 %
Brady Statistic AS VP Percent: 24.7 %
Brady Statistic AS VS Percent: 0.22 %
Brady Statistic RA Percent Paced: 73.76 %
Brady Statistic RV Percent Paced: 98.99 %
Date Time Interrogation Session: 20220125194226
HighPow Impedance: 70 Ohm
Implantable Lead Implant Date: 20151203
Implantable Lead Implant Date: 20151203
Implantable Lead Implant Date: 20151203
Implantable Lead Location: 753858
Implantable Lead Location: 753859
Implantable Lead Location: 753860
Implantable Lead Model: 4598
Implantable Lead Model: 5076
Implantable Lead Model: 6935
Implantable Pulse Generator Implant Date: 20151203
Lead Channel Impedance Value: 304 Ohm
Lead Channel Impedance Value: 380 Ohm
Lead Channel Impedance Value: 380 Ohm
Lead Channel Impedance Value: 380 Ohm
Lead Channel Impedance Value: 380 Ohm
Lead Channel Impedance Value: 380 Ohm
Lead Channel Impedance Value: 399 Ohm
Lead Channel Impedance Value: 437 Ohm
Lead Channel Impedance Value: 589 Ohm
Lead Channel Impedance Value: 627 Ohm
Lead Channel Impedance Value: 627 Ohm
Lead Channel Impedance Value: 627 Ohm
Lead Channel Impedance Value: 646 Ohm
Lead Channel Pacing Threshold Amplitude: 0.625 V
Lead Channel Pacing Threshold Amplitude: 1.125 V
Lead Channel Pacing Threshold Amplitude: 1.5 V
Lead Channel Pacing Threshold Pulse Width: 0.4 ms
Lead Channel Pacing Threshold Pulse Width: 0.4 ms
Lead Channel Pacing Threshold Pulse Width: 0.4 ms
Lead Channel Sensing Intrinsic Amplitude: 0.75 mV
Lead Channel Sensing Intrinsic Amplitude: 0.75 mV
Lead Channel Sensing Intrinsic Amplitude: 26.625 mV
Lead Channel Sensing Intrinsic Amplitude: 26.625 mV
Lead Channel Setting Pacing Amplitude: 1.5 V
Lead Channel Setting Pacing Amplitude: 2.25 V
Lead Channel Setting Pacing Amplitude: 3.25 V
Lead Channel Setting Pacing Pulse Width: 0.4 ms
Lead Channel Setting Pacing Pulse Width: 0.4 ms
Lead Channel Setting Sensing Sensitivity: 0.3 mV

## 2020-10-05 NOTE — Telephone Encounter (Signed)
[employer] alert, device battery within 6 months until ERI.  Monthly non-billable battery checks scheduled.  Attempted to reach pt to advise.  Per DPR ok to leave message on [phone], however no answer/ no VM on that line.  Generic VM left on home number requesting callback.

## 2020-10-05 NOTE — Telephone Encounter (Signed)
Patient returned call and I let patient know he will be on monthly checks and he is aware of the dates. Patient understood

## 2020-10-05 NOTE — Telephone Encounter (Signed)
Transmission received 10/04/2020 

## 2020-10-15 NOTE — Progress Notes (Signed)
Remote ICD transmission.   

## 2020-10-30 ENCOUNTER — Other Ambulatory Visit: Payer: Self-pay | Admitting: Cardiology

## 2020-10-30 DIAGNOSIS — J449 Chronic obstructive pulmonary disease, unspecified: Secondary | ICD-10-CM

## 2020-11-07 ENCOUNTER — Ambulatory Visit (INDEPENDENT_AMBULATORY_CARE_PROVIDER_SITE_OTHER): Payer: Self-pay

## 2020-11-07 DIAGNOSIS — I472 Ventricular tachycardia, unspecified: Secondary | ICD-10-CM

## 2020-11-09 LAB — CUP PACEART REMOTE DEVICE CHECK
Battery Remaining Longevity: 6 mo
Battery Voltage: 2.82 V
Brady Statistic AP VP Percent: 96.36 %
Brady Statistic AP VS Percent: 0.03 %
Brady Statistic AS VP Percent: 3.58 %
Brady Statistic AS VS Percent: 0.03 %
Brady Statistic RA Percent Paced: 95.89 %
Brady Statistic RV Percent Paced: 99.57 %
Date Time Interrogation Session: 20220228191006
HighPow Impedance: 67 Ohm
Implantable Lead Implant Date: 20151203
Implantable Lead Implant Date: 20151203
Implantable Lead Implant Date: 20151203
Implantable Lead Location: 753858
Implantable Lead Location: 753859
Implantable Lead Location: 753860
Implantable Lead Model: 4598
Implantable Lead Model: 5076
Implantable Lead Model: 6935
Implantable Pulse Generator Implant Date: 20151203
Lead Channel Impedance Value: 304 Ohm
Lead Channel Impedance Value: 342 Ohm
Lead Channel Impedance Value: 342 Ohm
Lead Channel Impedance Value: 380 Ohm
Lead Channel Impedance Value: 380 Ohm
Lead Channel Impedance Value: 380 Ohm
Lead Channel Impedance Value: 399 Ohm
Lead Channel Impedance Value: 437 Ohm
Lead Channel Impedance Value: 589 Ohm
Lead Channel Impedance Value: 589 Ohm
Lead Channel Impedance Value: 627 Ohm
Lead Channel Impedance Value: 627 Ohm
Lead Channel Impedance Value: 627 Ohm
Lead Channel Pacing Threshold Amplitude: 0.75 V
Lead Channel Pacing Threshold Amplitude: 1.125 V
Lead Channel Pacing Threshold Amplitude: 1.5 V
Lead Channel Pacing Threshold Pulse Width: 0.4 ms
Lead Channel Pacing Threshold Pulse Width: 0.4 ms
Lead Channel Pacing Threshold Pulse Width: 0.4 ms
Lead Channel Sensing Intrinsic Amplitude: 1.625 mV
Lead Channel Sensing Intrinsic Amplitude: 1.625 mV
Lead Channel Sensing Intrinsic Amplitude: 28.625 mV
Lead Channel Sensing Intrinsic Amplitude: 28.625 mV
Lead Channel Setting Pacing Amplitude: 1.5 V
Lead Channel Setting Pacing Amplitude: 2.25 V
Lead Channel Setting Pacing Amplitude: 3 V
Lead Channel Setting Pacing Pulse Width: 0.4 ms
Lead Channel Setting Pacing Pulse Width: 0.4 ms
Lead Channel Setting Sensing Sensitivity: 0.3 mV

## 2020-11-15 NOTE — Addendum Note (Signed)
Addended by: Geralyn Flash D on: 11/15/2020 10:31 AM   Modules accepted: Level of Service

## 2020-11-15 NOTE — Progress Notes (Signed)
Remote ICD transmission.   

## 2020-12-08 ENCOUNTER — Other Ambulatory Visit: Payer: Self-pay

## 2020-12-08 ENCOUNTER — Ambulatory Visit (INDEPENDENT_AMBULATORY_CARE_PROVIDER_SITE_OTHER): Payer: Self-pay

## 2020-12-08 DIAGNOSIS — I255 Ischemic cardiomyopathy: Secondary | ICD-10-CM

## 2020-12-08 LAB — CUP PACEART REMOTE DEVICE CHECK
Battery Remaining Longevity: 6 mo
Battery Voltage: 2.82 V
Brady Statistic AP VP Percent: 69.21 %
Brady Statistic AP VS Percent: 0.06 %
Brady Statistic AS VP Percent: 30.43 %
Brady Statistic AS VS Percent: 0.3 %
Brady Statistic RA Percent Paced: 67.79 %
Brady Statistic RV Percent Paced: 98.73 %
Date Time Interrogation Session: 20220331043724
HighPow Impedance: 67 Ohm
Implantable Lead Implant Date: 20151203
Implantable Lead Implant Date: 20151203
Implantable Lead Implant Date: 20151203
Implantable Lead Location: 753858
Implantable Lead Location: 753859
Implantable Lead Location: 753860
Implantable Lead Model: 4598
Implantable Lead Model: 5076
Implantable Lead Model: 6935
Implantable Pulse Generator Implant Date: 20151203
Lead Channel Impedance Value: 304 Ohm
Lead Channel Impedance Value: 323 Ohm
Lead Channel Impedance Value: 323 Ohm
Lead Channel Impedance Value: 323 Ohm
Lead Channel Impedance Value: 323 Ohm
Lead Channel Impedance Value: 342 Ohm
Lead Channel Impedance Value: 380 Ohm
Lead Channel Impedance Value: 380 Ohm
Lead Channel Impedance Value: 532 Ohm
Lead Channel Impedance Value: 532 Ohm
Lead Channel Impedance Value: 570 Ohm
Lead Channel Impedance Value: 570 Ohm
Lead Channel Impedance Value: 570 Ohm
Lead Channel Pacing Threshold Amplitude: 0.625 V
Lead Channel Pacing Threshold Amplitude: 1.125 V
Lead Channel Pacing Threshold Amplitude: 1.5 V
Lead Channel Pacing Threshold Pulse Width: 0.4 ms
Lead Channel Pacing Threshold Pulse Width: 0.4 ms
Lead Channel Pacing Threshold Pulse Width: 0.4 ms
Lead Channel Sensing Intrinsic Amplitude: 0.625 mV
Lead Channel Sensing Intrinsic Amplitude: 0.625 mV
Lead Channel Sensing Intrinsic Amplitude: 12.5 mV
Lead Channel Sensing Intrinsic Amplitude: 12.5 mV
Lead Channel Setting Pacing Amplitude: 1.5 V
Lead Channel Setting Pacing Amplitude: 2.25 V
Lead Channel Setting Pacing Amplitude: 3 V
Lead Channel Setting Pacing Pulse Width: 0.4 ms
Lead Channel Setting Pacing Pulse Width: 0.4 ms
Lead Channel Setting Sensing Sensitivity: 0.3 mV

## 2020-12-21 NOTE — Progress Notes (Signed)
Remote ICD transmission.   

## 2020-12-24 ENCOUNTER — Other Ambulatory Visit: Payer: Self-pay | Admitting: Internal Medicine

## 2020-12-31 ENCOUNTER — Other Ambulatory Visit: Payer: Self-pay | Admitting: Cardiology

## 2020-12-31 DIAGNOSIS — J4489 Other specified chronic obstructive pulmonary disease: Secondary | ICD-10-CM

## 2020-12-31 DIAGNOSIS — J449 Chronic obstructive pulmonary disease, unspecified: Secondary | ICD-10-CM

## 2021-01-03 ENCOUNTER — Ambulatory Visit (INDEPENDENT_AMBULATORY_CARE_PROVIDER_SITE_OTHER): Payer: Self-pay

## 2021-01-03 DIAGNOSIS — I255 Ischemic cardiomyopathy: Secondary | ICD-10-CM

## 2021-01-04 LAB — CUP PACEART REMOTE DEVICE CHECK
Battery Remaining Longevity: 4 mo
Battery Voltage: 2.79 V
Brady Statistic AP VP Percent: 70.9 %
Brady Statistic AP VS Percent: 0.06 %
Brady Statistic AS VP Percent: 28.64 %
Brady Statistic AS VS Percent: 0.4 %
Brady Statistic RA Percent Paced: 69.06 %
Brady Statistic RV Percent Paced: 98.6 %
Date Time Interrogation Session: 20220426222510
HighPow Impedance: 71 Ohm
Implantable Lead Implant Date: 20151203
Implantable Lead Implant Date: 20151203
Implantable Lead Implant Date: 20151203
Implantable Lead Location: 753858
Implantable Lead Location: 753859
Implantable Lead Location: 753860
Implantable Lead Model: 4598
Implantable Lead Model: 5076
Implantable Lead Model: 6935
Implantable Pulse Generator Implant Date: 20151203
Lead Channel Impedance Value: 304 Ohm
Lead Channel Impedance Value: 323 Ohm
Lead Channel Impedance Value: 342 Ohm
Lead Channel Impedance Value: 342 Ohm
Lead Channel Impedance Value: 342 Ohm
Lead Channel Impedance Value: 342 Ohm
Lead Channel Impedance Value: 380 Ohm
Lead Channel Impedance Value: 399 Ohm
Lead Channel Impedance Value: 570 Ohm
Lead Channel Impedance Value: 570 Ohm
Lead Channel Impedance Value: 589 Ohm
Lead Channel Impedance Value: 589 Ohm
Lead Channel Impedance Value: 589 Ohm
Lead Channel Pacing Threshold Amplitude: 0.75 V
Lead Channel Pacing Threshold Amplitude: 1.125 V
Lead Channel Pacing Threshold Amplitude: 1.5 V
Lead Channel Pacing Threshold Pulse Width: 0.4 ms
Lead Channel Pacing Threshold Pulse Width: 0.4 ms
Lead Channel Pacing Threshold Pulse Width: 0.4 ms
Lead Channel Sensing Intrinsic Amplitude: 0.625 mV
Lead Channel Sensing Intrinsic Amplitude: 0.625 mV
Lead Channel Sensing Intrinsic Amplitude: 22.625 mV
Lead Channel Sensing Intrinsic Amplitude: 22.625 mV
Lead Channel Setting Pacing Amplitude: 1.5 V
Lead Channel Setting Pacing Amplitude: 2.25 V
Lead Channel Setting Pacing Amplitude: 3.5 V
Lead Channel Setting Pacing Pulse Width: 0.4 ms
Lead Channel Setting Pacing Pulse Width: 0.4 ms
Lead Channel Setting Sensing Sensitivity: 0.3 mV

## 2021-01-17 ENCOUNTER — Telehealth: Payer: Self-pay | Admitting: Internal Medicine

## 2021-01-17 ENCOUNTER — Encounter: Payer: Self-pay | Admitting: Student

## 2021-01-17 ENCOUNTER — Other Ambulatory Visit: Payer: Self-pay | Admitting: Internal Medicine

## 2021-01-17 NOTE — Telephone Encounter (Signed)
Transmission reviewed with patient form may 1st.

## 2021-01-17 NOTE — Telephone Encounter (Signed)
New message  Pt would like a call to discuss recent transmission.

## 2021-01-19 ENCOUNTER — Other Ambulatory Visit: Payer: Self-pay | Admitting: Internal Medicine

## 2021-01-25 NOTE — Progress Notes (Signed)
Remote ICD transmission.   

## 2021-02-06 NOTE — Progress Notes (Signed)
Cardiology Office Note Date:  02/07/2021  Patient ID:  [name], [name] Dec 24, 1940, MRN [id number] PCP:  Patient, No Pcp Per (Inactive)  Cardiologist:  Dr. Swaziland Electrophysiologist: Dr. Johney Frame    Chief Complaint:  annual visit  History of Present Illness: [name] is a 80 y.o. male with history of CAD (2014 cardiac cath showed patent RCA stent. There was a 50% mid LAD lesion. The first diagonal and first OM were occluded. Attempted PCI of the OM but unable to cross with wire), ICM, chronic CHF (systolic), NSVT, COPD (home O2 at HS), former smoker, HTN, HLD, SCAF.  EF has waxed/waned  He comes in today to be seen for Dr. Johney Frame, last seen by him via telehealth visit April 2021, was doing well.  Noted AFib, burden was <0.1% discussed CHA2DS2Vasc of 5, and if burden were to increase woyj consider a/c.   Most recently saw Dr. Swaziland May 2021, c/o increased DOE, working in [employer] and having to stop and rest, suspect to be mixed CHF/COPD, planned to update his echo and get a CXR and BNP.  Echo noted LVEF 50-55%, grade I DD, his SOB felt 2/2 COPD  TODAY He again mentions SOB that waxes/wanes. He has not seen pulmonary in 2 years and mentions it has been many, many years since he has had a PMD. No CP, palpitations or particular cardiac awareness. No near syncope or syncope, has had an infrequent fleeting dizzy spell, not positional or with change in position, lasts just a few seconds, and is infrequent.    Device information MDT CRT-D implanted 08/12/2014   Past Medical History:  Diagnosis Date  . Acute renal failure (HCC) 05/31/2014  . Angina decubitus (HCC) 06/15/2014  . CAD (coronary artery disease)    a. s/p stent RCA DES 9/09; b. 2014 Attempted PCI of OM1 @ [place] [facility];  c. 04/2014 Cath: LAD 40-50p, D1 95-99 (chronic), LCX 30-40 inf branch, OM1 CTO, RCA 30-40p, RCA patent stent, EF 35%->Med Rx.  . Chronic combined systolic and diastolic CHF (congestive heart failure)  (HCC)    a. EF about 40 to 45% per echo in April 2013;  b. 04/2014 Echo: EF 20-25%, sev LVH, sev glob HK, Gr 1 DD, mildly reduced RV fxn, PASP . c. 01/2017: EF improved to 50-55%.   . Chronic low back pain   . COPD (chronic obstructive pulmonary disease) (HCC) dx 06/2013   PFTs 07/08/13: mod obst with resp to bronchodilator, moderate decrease diffusion, airtrapping  . COPD with acute exacerbation (HCC) 01/17/2017  . COPD with asthma (HCC)    07/08/13 PFT: FEV1 1.74L (66% pred, 30% change with BD), mod obst with resp to bronchodilator, moderate decrease diffusion, air-trapping 11/2013 Simple spiro>> clear obstruction, FEV1 1.30 L (47% pred) - trial of symbicort 160 2bid 01/26/15     . Depression 09/15/2018   Assessment: Increased sadness and depression since losing job  Plan: Patient denies suicidal and homicidal ideations Patient would not like to start medication therapies at this time Will establish patient with [facility] for primary care  . Dyslipidemia    a. on statin  . Essential hypertension   . HLD (hyperlipidemia)   . HTN (hypertension)    a. Reports intolerance to hydralazine; b. no beta blockers 2/2 bradycardia;  c. failed on ACE and ARB.  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 LBBB (left bundle branch block)   . LV dysfunction 12/13/2011  . LVH (left ventricular hypertrophy)   .  Mixed Ischemic/Non-ischemic Cardiomyopathy    a. 04/2014 Echo: EF 20-25%, sev glob HK.  . Mixed Ischemic/Non-Ischemic Cardiomyopathy    probable mixed ischemic and non-ischemic   . Noncompliance   . NSVT (nonsustained ventricular tachycardia) (HCC)   . OSA (obstructive sleep apnea) 04/18/2018   06/11/16 - home sleep study shows AHI of 2.9 an hour with the lowest SaO2 of 79% with an average of 93%  03/08/2018-Home sleep study-AHI 7/HR, SaO2 low 81%   . Paroxysmal atrial fibrillation (HCC)    a. identified on device interrogation 01/2016  . Pneumonia 09/02/2013  . Pulmonary edema  05/10/2014  . Respiratory arrest (HCC) 05/18/2014  . Sinus bradycardia 05/31/2014  . Syncope 05/30/2014    Past Surgical History:  Procedure Laterality Date  . BI-VENTRICULAR IMPLANTABLE CARDIOVERTER DEFIBRILLATOR N/A 08/12/2014   MDT Ovidio Kin XT CRTD implanted by Dr Johney Frame  . CARDIAC CATHETERIZATION     ejection fraction 50%  . LEFT HEART CATHETERIZATION WITH CORONARY ANGIOGRAM N/A 05/08/2014   Procedure: LEFT HEART CATHETERIZATION WITH CORONARY ANGIOGRAM;  Surgeon: Micheline Chapman, MD;  Location: [facility] CATH LAB;  Service: Cardiovascular;  Laterality: N/A;    Current Outpatient Medications  Medication Sig Dispense Refill  . albuterol (PROVENTIL) (2.5 MG/3ML) 0.083% nebulizer solution Take 3 mLs (2.5 mg total) by nebulization every 6 (six) hours as needed for wheezing or shortness of breath. 360 mL 11  . amLODipine (NORVASC) 5 MG tablet Take 1 tablet (5 mg total) by mouth daily. Please keep upcoming appt in May 2022 before anymore refills. Thank you 90 tablet 0  . atorvastatin (LIPITOR) 20 MG tablet Take 1 tablet (20 mg total) by mouth daily. 90 tablet 2  . budesonide-formoterol (SYMBICORT) 160-4.5 MCG/ACT inhaler TAKE 2 PUFFS BY MOUTH TWICE A DAY 10.2 each 1  . clopidogrel (PLAVIX) 75 MG tablet Take 1 tablet (75 mg total) by mouth daily. Please keep upcoming appt with Dr. Johney Frame before anymore refills. Thank you 30 tablet 0  . isosorbide mononitrate (IMDUR) 60 MG 24 hr tablet Take 1 tablet (60 mg total) by mouth daily. 90 tablet 2  . losartan (COZAAR) 100 MG tablet Take 1 tablet (100 mg total) by mouth daily. 90 tablet 3  . potassium chloride SA (K-DUR,KLOR-CON) 20 MEQ tablet Take 40 mEq by mouth daily.    . traMADol (ULTRAM) 50 MG tablet Take 1 tablet (50 mg total) by mouth every 6 (six) hours as needed. 10 tablet 0  . albuterol (VENTOLIN HFA) 108 (90 Base) MCG/ACT inhaler Inhale 1 puff into the lungs every 6 (six) hours as needed for wheezing or shortness of breath. 1 each 0  . metoprolol  tartrate (LOPRESSOR) 100 MG tablet Take 1 tablet (100 mg total) by mouth 2 (two) times daily. 180 tablet 1  . nitroGLYCERIN (NITROSTAT) 0.4 MG SL tablet Place 1 tablet (0.4 mg total) under the tongue every 5 (five) minutes as needed for chest pain. 25 tablet 3   No current facility-administered medications for this visit.    Allergies:   Acyclovir and related and Aspirin   Social History:  The patient  reports that he quit smoking about 8 years ago. His smoking use included cigarettes. He has a 28.00 pack-year smoking history. He has never used smokeless tobacco. He reports that he does not drink alcohol and does not use drugs.   Family History:  The patient's family history is not on file.  ROS:  Please see the history of present illness.    All other  systems are reviewed and otherwise negative.   PHYSICAL EXAM:  VS:  BP (!) 168/88   Pulse 68   Ht 5\' 8"  (1.727 m)   Wt 149 lb (67.6 kg)   BMI 22.66 kg/m  BMI: Body mass index is 22.66 kg/m. Well nourished, well developed, in no acute distress HEENT: normocephalic, atraumatic Neck: no JVD, carotid bruits or masses Cardiac:  RRR; no significant murmurs, no rubs, or gallops Lungs:  CTA b/l, no wheezing, rhonchi or rales Abd: soft, nontender MS: no deformity or atrophy Ext: no edema Skin: warm and dry, no rash Neuro:  No gross deficits appreciated Psych: euthymic mood, full affect   ICD site is stable, no tethering or discomfort   EKG:  Done today and reviewed by myself shows  AV paced with PACs  Device interrogation done today and reviewed by myself:  Battery etimate to ERI is 40mo Lead measurements are good Many NSVT episodes AT/AF labeled episodes as well. NSVTs are true and not new for him, (471 since March 2020) No treated VT In review of available EGMs for his AF episodes, I think these are farfield perhaps not true Afib   01/28/2020: TTE IMPRESSIONS  1. Left ventricular ejection fraction, by estimation, is 50 to  55%. The  left ventricle has low normal function. The left ventricle has no regional  wall motion abnormalities. There is moderate concentric left ventricular  hypertrophy. Left ventricular  diastolic parameters are consistent with Grade I diastolic dysfunction  (impaired relaxation).  2. Right ventricular systolic function is normal. The right ventricular  size is normal.  3. Left atrial size was moderately dilated.  4. The mitral valve is normal in structure. Mild mitral valve  regurgitation. No evidence of mitral stenosis.  5. The aortic valve is tricuspid. Aortic valve regurgitation is not  visualized. No aortic stenosis is present.  6. The inferior vena cava is normal in size with greater than 50%  respiratory variability, suggesting right atrial pressure of 3 mmHg.    07/05/2016: CPX Conclusion: The interpretation of this test is limited due to submaximal effort during the exercise. Based on available data, exercise testing with gas exchange demonstrates moderate functional impairment. Pre-exercise spirometry suggests mild obstructive pattern. With significantly hypertensive response to exercise, flat O2 pulse and elevated VE/VCO2 slope patient is most likely limited due to diastolic dysfunction and possible early pulmonary vascular disease.   Test, report and preliminary impression by:  07/07/2016, MS, ACSM-RCEP  07/05/2016 9:46 AM  Preliminary CPX Results, Finalized results will be forwarded when completed by interpreting physician.   Agree with above. This is submax test with mild functional limitation that is multifactorial in nature including COPD, deconditioning and severe hypertensive response with mild circulatory limitation. Would consider exercise training program and tighter BP control.    02/20/2016: stress myoview  Nuclear stress EF: 39%. Diffuse hypokinesis with asynchronous contraction consistent with pacemaker  There was no ST segment deviation noted  during stress.  Defect 1: There is a small defect of moderate severity present in the mid inferolateral location. Question possible focal lateral infarction.  Findings consistent with prior myocardial infarction. No areas of ischemia identified.  This is an intermediate risk study.   Recent Labs: No results found for requested labs within last 8760 hours.  No results found for requested labs within last 8760 hours.   CrCl cannot be calculated (Patient's most recent lab result is older than the maximum 21 days allowed.).   Wt Readings from Last  3 Encounters:  02/07/21 149 lb (67.6 kg)  01/11/20 151 lb 9.6 oz (68.8 kg)  12/21/19 155 lb (70.3 kg)     Other studies reviewed: Additional studies/records reviewed today include: summarized above  ASSESSMENT AND PLAN:  1. ICD     Intact function, nearing ERI  2. CAD     No anginal symptoms     On plavix, BB,statin, nitrate     Sees Dr. Swaziland in a few weeks  3. ICM 4. Chronic CHF (systolic)     Recovered LVEF by echo last year     No symptoms or exam findings of volume OL, OptiVol looks good     On BB, ARB, nitrate   5. HTN     High today  6. HLD     Will plan fasting labs between now and his visit with Dr. Swaziland  7. SCAF     CHA2DS2Vasc is 5     I did not appreciate any clear TRUE Afib, suspect much of his AF burden is farfield,       8. NSVT     Not new for him     Increase lopressor to 100mg  BID  9. COPD     He needs refills for his Symbicort and ventolin inhaler     He has not seen pulm in 2 years and has not had a PMD in many years     Will provide one refill for both     He is given information for [facility] (he has been there) and primary care   Disposition: F/u with remotes monthly for battery staus, Dr. in a few weeks as scheduled and Dr. Swaziland in 40, will be about ERI then likely.  Current medicines are reviewed at length with the patient today.  The patient did not have any concerns  regarding medicines.  Johney Frame, PA-C 02/07/2021 4:28 PM     [facility] [address] [place] [phone] (office)  [phone] (fax)

## 2021-02-07 ENCOUNTER — Other Ambulatory Visit: Payer: Self-pay

## 2021-02-07 ENCOUNTER — Encounter: Payer: Self-pay | Admitting: Physician Assistant

## 2021-02-07 ENCOUNTER — Ambulatory Visit (INDEPENDENT_AMBULATORY_CARE_PROVIDER_SITE_OTHER): Payer: Self-pay | Admitting: Physician Assistant

## 2021-02-07 VITALS — BP 168/88 | HR 68 | Ht 68.0 in | Wt 149.0 lb

## 2021-02-07 DIAGNOSIS — J449 Chronic obstructive pulmonary disease, unspecified: Secondary | ICD-10-CM

## 2021-02-07 DIAGNOSIS — Z79899 Other long term (current) drug therapy: Secondary | ICD-10-CM

## 2021-02-07 DIAGNOSIS — I255 Ischemic cardiomyopathy: Secondary | ICD-10-CM

## 2021-02-07 LAB — CUP PACEART INCLINIC DEVICE CHECK
Battery Remaining Longevity: 4 mo
Battery Voltage: 2.78 V
Brady Statistic AP VP Percent: 68.17 %
Brady Statistic AP VS Percent: 0.11 %
Brady Statistic AS VP Percent: 30.82 %
Brady Statistic AS VS Percent: 0.9 %
Brady Statistic RA Percent Paced: 65.71 %
Brady Statistic RV Percent Paced: 96.74 %
Date Time Interrogation Session: 20220531172446
HighPow Impedance: 72 Ohm
Implantable Lead Implant Date: 20151203
Implantable Lead Implant Date: 20151203
Implantable Lead Implant Date: 20151203
Implantable Lead Location: 753858
Implantable Lead Location: 753859
Implantable Lead Location: 753860
Implantable Lead Model: 4598
Implantable Lead Model: 5076
Implantable Lead Model: 6935
Implantable Pulse Generator Implant Date: 20151203
Lead Channel Impedance Value: 304 Ohm
Lead Channel Impedance Value: 342 Ohm
Lead Channel Impedance Value: 342 Ohm
Lead Channel Impedance Value: 342 Ohm
Lead Channel Impedance Value: 342 Ohm
Lead Channel Impedance Value: 380 Ohm
Lead Channel Impedance Value: 380 Ohm
Lead Channel Impedance Value: 437 Ohm
Lead Channel Impedance Value: 570 Ohm
Lead Channel Impedance Value: 589 Ohm
Lead Channel Impedance Value: 589 Ohm
Lead Channel Impedance Value: 589 Ohm
Lead Channel Impedance Value: 589 Ohm
Lead Channel Pacing Threshold Amplitude: 0.625 V
Lead Channel Pacing Threshold Amplitude: 1 V
Lead Channel Pacing Threshold Amplitude: 1.375 V
Lead Channel Pacing Threshold Pulse Width: 0.4 ms
Lead Channel Pacing Threshold Pulse Width: 0.4 ms
Lead Channel Pacing Threshold Pulse Width: 0.4 ms
Lead Channel Sensing Intrinsic Amplitude: 0.75 mV
Lead Channel Sensing Intrinsic Amplitude: 1.75 mV
Lead Channel Sensing Intrinsic Amplitude: 6.25 mV
Lead Channel Sensing Intrinsic Amplitude: 6.25 mV
Lead Channel Setting Pacing Amplitude: 1.5 V
Lead Channel Setting Pacing Amplitude: 2.25 V
Lead Channel Setting Pacing Amplitude: 3 V
Lead Channel Setting Pacing Pulse Width: 0.4 ms
Lead Channel Setting Pacing Pulse Width: 0.4 ms
Lead Channel Setting Sensing Sensitivity: 0.3 mV

## 2021-02-07 MED ORDER — ALBUTEROL SULFATE HFA 108 (90 BASE) MCG/ACT IN AERS: 1.0000 | INHALATION_SPRAY | Freq: Four times a day (QID) | RESPIRATORY_TRACT | 0 refills | Status: DC | PRN

## 2021-02-07 MED ORDER — NITROGLYCERIN 0.4 MG SL SUBL: 0.4000 mg | SUBLINGUAL_TABLET | SUBLINGUAL | 3 refills | Status: DC | PRN

## 2021-02-07 MED ORDER — METOPROLOL TARTRATE 100 MG PO TABS: 100.0000 mg | ORAL_TABLET | Freq: Two times a day (BID) | ORAL | 1 refills | Status: DC

## 2021-02-07 NOTE — Patient Instructions (Addendum)
Medication Instructions:   START TAKING METOPROLOL 100 MG TWICE A DAY   *If you need a refill on your cardiac medications before your next appointment, please call your pharmacy*   Lab Work:  RETURN FOR FASTING LABS BEFORE 02-28-21     CMET  MAG LIPIDS AD CBC TODAY    If you have labs (blood work) drawn today and your tests are completely normal, you will receive your results only by: [name] MyChart Message (if you have MyChart) OR . A paper copy in the mail If you have any lab test that is abnormal or we need to change your treatment, we will call you to review the results.   Testing/Procedures: NONE ORDERED  TODAY   Follow-Up: At [facility], you and your health needs are our priority.  As part of our continuing mission to provide you with exceptional heart care, we have created designated Provider Care Teams.  These Care Teams include your primary Cardiologist (physician) and Advanced Practice Providers (APPs -  Physician Assistants and Nurse Practitioners) who all work together to provide you with the care you need, when you need it.  We recommend signing up for the patient portal called "MyChart".  Sign up information is provided on this After Visit Summary.  MyChart is used to connect with patients for Virtual Visits (Telemedicine).  Patients are able to view lab/test results, encounter notes, upcoming appointments, etc.  Non-urgent messages can be sent to your provider as well.   To learn more about what you can do with MyChart, go to [email or web address].    Your next appointment:   4 month(s)  On a Monday  Or Tuesday after 2pm  per patient   The format for your next appointment:   In Person  Provider:   Hillis Range, MD   Other Instructions  MAKE SURE YOU CONTACT [facility] AND YOUR PULMONOLOGIST TO GET ESTABLISHED WITH OFFICE AND MAKE A APPOINTMENT  LIST OF PRIMARY PROVIDER LOCATIONS  1. Nature conservation officer at [address] .  [place], [place] Phone: [phone] . Behavioral Medicine: [phone] . Fax: [phone] Our office is located at the corner of [address] and [address].  APPOINTMENT HOURS are 8:00am-5:00pm Monday through Friday  LAB VISIT APPOINTMENTS are available and recommended. 7:45am-4:45pm (Tue-Thu), 8:00am-4:45pm (Mon & Fri  2.Nature conservation officer at [employer] [address] . [place], [place]: [phone] . Sports Medicine: [phone] . Fax: [phone] . Hours (M-F): 8 am - 5 pm Primary Care, Behavioral Medicine   3. Adult nurse Healthcare at [facility] . [place], [place]: [phone] . Behavioral Medicine: [phone] . Fax: [phone] . Hours: 7am - 5pm (M, Th, F); 7am - 6pm (Tue & Wed)

## 2021-02-09 ENCOUNTER — Ambulatory Visit (INDEPENDENT_AMBULATORY_CARE_PROVIDER_SITE_OTHER): Payer: Medicare Other

## 2021-02-09 DIAGNOSIS — I255 Ischemic cardiomyopathy: Secondary | ICD-10-CM

## 2021-02-10 ENCOUNTER — Other Ambulatory Visit: Payer: Self-pay | Admitting: Internal Medicine

## 2021-02-11 LAB — CUP PACEART REMOTE DEVICE CHECK
Battery Remaining Longevity: 4 mo
Battery Voltage: 2.77 V
Brady Statistic AP VP Percent: 76.3 %
Brady Statistic AP VS Percent: 0.06 %
Brady Statistic AS VP Percent: 23.38 %
Brady Statistic AS VS Percent: 0.26 %
Brady Statistic RA Percent Paced: 74.9 %
Brady Statistic RV Percent Paced: 99.06 %
Date Time Interrogation Session: 20220603032205
HighPow Impedance: 63 Ohm
Implantable Lead Implant Date: 20151203
Implantable Lead Implant Date: 20151203
Implantable Lead Implant Date: 20151203
Implantable Lead Location: 753858
Implantable Lead Location: 753859
Implantable Lead Location: 753860
Implantable Lead Model: 4598
Implantable Lead Model: 5076
Implantable Lead Model: 6935
Implantable Pulse Generator Implant Date: 20151203
Lead Channel Impedance Value: 266 Ohm
Lead Channel Impedance Value: 304 Ohm
Lead Channel Impedance Value: 323 Ohm
Lead Channel Impedance Value: 323 Ohm
Lead Channel Impedance Value: 342 Ohm
Lead Channel Impedance Value: 342 Ohm
Lead Channel Impedance Value: 342 Ohm
Lead Channel Impedance Value: 399 Ohm
Lead Channel Impedance Value: 513 Ohm
Lead Channel Impedance Value: 532 Ohm
Lead Channel Impedance Value: 532 Ohm
Lead Channel Impedance Value: 570 Ohm
Lead Channel Impedance Value: 570 Ohm
Lead Channel Pacing Threshold Amplitude: 0.625 V
Lead Channel Pacing Threshold Amplitude: 1.125 V
Lead Channel Pacing Threshold Amplitude: 1.5 V
Lead Channel Pacing Threshold Pulse Width: 0.4 ms
Lead Channel Pacing Threshold Pulse Width: 0.4 ms
Lead Channel Pacing Threshold Pulse Width: 0.4 ms
Lead Channel Sensing Intrinsic Amplitude: 0.875 mV
Lead Channel Sensing Intrinsic Amplitude: 0.875 mV
Lead Channel Sensing Intrinsic Amplitude: 26.25 mV
Lead Channel Sensing Intrinsic Amplitude: 26.25 mV
Lead Channel Setting Pacing Amplitude: 1.5 V
Lead Channel Setting Pacing Amplitude: 2.25 V
Lead Channel Setting Pacing Amplitude: 3.25 V
Lead Channel Setting Pacing Pulse Width: 0.4 ms
Lead Channel Setting Pacing Pulse Width: 0.4 ms
Lead Channel Setting Sensing Sensitivity: 0.3 mV

## 2021-02-13 ENCOUNTER — Emergency Department (HOSPITAL_COMMUNITY): Payer: Self-pay

## 2021-02-13 ENCOUNTER — Encounter (HOSPITAL_COMMUNITY): Payer: Self-pay

## 2021-02-13 ENCOUNTER — Other Ambulatory Visit: Payer: Self-pay

## 2021-02-13 ENCOUNTER — Emergency Department (HOSPITAL_COMMUNITY)
Admission: EM | Admit: 2021-02-13 | Discharge: 2021-02-13 | Disposition: A | Discharge status: Home / Self Care | Payer: Self-pay | Attending: Emergency Medicine | Admitting: Emergency Medicine

## 2021-02-13 DIAGNOSIS — R42 Dizziness and giddiness: Secondary | ICD-10-CM | POA: Insufficient documentation

## 2021-02-13 DIAGNOSIS — I2510 Atherosclerotic heart disease of native coronary artery without angina pectoris: Secondary | ICD-10-CM | POA: Insufficient documentation

## 2021-02-13 DIAGNOSIS — I110 Hypertensive heart disease with heart failure: Secondary | ICD-10-CM | POA: Insufficient documentation

## 2021-02-13 DIAGNOSIS — J45909 Unspecified asthma, uncomplicated: Secondary | ICD-10-CM | POA: Insufficient documentation

## 2021-02-13 DIAGNOSIS — Z7951 Long term (current) use of inhaled steroids: Secondary | ICD-10-CM | POA: Insufficient documentation

## 2021-02-13 DIAGNOSIS — Z79899 Other long term (current) drug therapy: Secondary | ICD-10-CM | POA: Insufficient documentation

## 2021-02-13 DIAGNOSIS — Z955 Presence of coronary angioplasty implant and graft: Secondary | ICD-10-CM | POA: Insufficient documentation

## 2021-02-13 DIAGNOSIS — Z87891 Personal history of nicotine dependence: Secondary | ICD-10-CM | POA: Insufficient documentation

## 2021-02-13 DIAGNOSIS — R61 Generalized hyperhidrosis: Secondary | ICD-10-CM | POA: Insufficient documentation

## 2021-02-13 DIAGNOSIS — R0602 Shortness of breath: Secondary | ICD-10-CM | POA: Insufficient documentation

## 2021-02-13 DIAGNOSIS — I5042 Chronic combined systolic (congestive) and diastolic (congestive) heart failure: Secondary | ICD-10-CM | POA: Insufficient documentation

## 2021-02-13 DIAGNOSIS — J441 Chronic obstructive pulmonary disease with (acute) exacerbation: Secondary | ICD-10-CM | POA: Insufficient documentation

## 2021-02-13 LAB — URINALYSIS, ROUTINE W REFLEX MICROSCOPIC
Bilirubin Urine: NEGATIVE
Glucose, UA: NEGATIVE mg/dL
Hgb urine dipstick: NEGATIVE
Ketones, ur: NEGATIVE mg/dL
Nitrite: NEGATIVE
Protein, ur: NEGATIVE mg/dL
Specific Gravity, Urine: 1.017 (ref 1.005–1.030)
pH: 6 (ref 5.0–8.0)

## 2021-02-13 LAB — CBC WITH DIFFERENTIAL/PLATELET
Abs Immature Granulocytes: 0.02 10*3/uL (ref 0.00–0.07)
Basophils Absolute: 0 10*3/uL (ref 0.0–0.1)
Basophils Relative: 0 %
Eosinophils Absolute: 0.1 10*3/uL (ref 0.0–0.5)
Eosinophils Relative: 2 %
HCT: 41.2 % (ref 39.0–52.0)
Hemoglobin: 12.8 g/dL — ABNORMAL LOW (ref 13.0–17.0)
Immature Granulocytes: 0 %
Lymphocytes Relative: 42 %
Lymphs Abs: 2.7 10*3/uL (ref 0.7–4.0)
MCH: 28.1 pg (ref 26.0–34.0)
MCHC: 31.1 g/dL (ref 30.0–36.0)
MCV: 90.5 fL (ref 80.0–100.0)
Monocytes Absolute: 0.6 10*3/uL (ref 0.1–1.0)
Monocytes Relative: 9 %
Neutro Abs: 3 10*3/uL (ref 1.7–7.7)
Neutrophils Relative %: 47 %
Platelets: 173 10*3/uL (ref 150–400)
RBC: 4.55 MIL/uL (ref 4.22–5.81)
RDW: 14.2 % (ref 11.5–15.5)
WBC: 6.4 10*3/uL (ref 4.0–10.5)
nRBC: 0 % (ref 0.0–0.2)

## 2021-02-13 LAB — TROPONIN I (HIGH SENSITIVITY)
Troponin I (High Sensitivity): 41 ng/L — ABNORMAL HIGH (ref <18)
Troponin I (High Sensitivity): 38 ng/L — ABNORMAL HIGH (ref <18)

## 2021-02-13 LAB — COMPREHENSIVE METABOLIC PANEL
ALT: 13 U/L (ref 0–44)
AST: 35 U/L (ref 15–41)
Albumin: 3.8 g/dL (ref 3.5–5.0)
Alkaline Phosphatase: 68 U/L (ref 38–126)
Anion gap: 7 (ref 5–15)
BUN: 9 mg/dL (ref 8–23)
CO2: 27 mmol/L (ref 22–32)
Calcium: 8.9 mg/dL (ref 8.9–10.3)
Chloride: 105 mmol/L (ref 98–111)
Creatinine, Ser: 0.96 mg/dL (ref 0.61–1.24)
GFR, Estimated: >60 mL/min (ref >60)
Glucose, Bld: 91 mg/dL (ref 70–99)
Potassium: 4.5 mmol/L (ref 3.5–5.1)
Sodium: 139 mmol/L (ref 135–145)
Total Bilirubin: 1.3 mg/dL — ABNORMAL HIGH (ref 0.3–1.2)
Total Protein: 6.9 g/dL (ref 6.5–8.1)

## 2021-02-13 MED ORDER — CEPHALEXIN 500 MG PO CAPS: 500.0000 mg | ORAL_CAPSULE | Freq: Two times a day (BID) | ORAL | 0 refills | Status: AC

## 2021-02-13 MED ORDER — ASPIRIN 81 MG PO CHEW
324.0000 mg | CHEWABLE_TABLET | Freq: Once | ORAL | Status: AC
  Administered 2021-02-13: 324 mg via ORAL
  Filled 2021-02-13: qty 4

## 2021-02-13 NOTE — ED Notes (Signed)
RN reviewed discharge instructions w/ pt. Prescriptions and follow up reviewed, pt had no further questions 

## 2021-02-13 NOTE — Discharge Instructions (Addendum)
Recommend follow-up with cardiology for repeat assessment.  There were some abnormalities with the urine, including bacteria in the urine.  As a precaution, an antibiotic has been prescribed.  Especially if you continue to feel poorly or begin to have urinary symptoms, such as discomfort or frequency in urination, then begin taking the antibiotic.

## 2021-02-13 NOTE — ED Provider Notes (Signed)
[facility] Note   CSN: [id number] Arrival date & time: 02/13/21  0654     History Chief Complaint  Patient presents with  . Hypertension    [name] is a 80 y.o. male.  HPI      [name] is a 80 y.o. male, with a history of CAD, COPD, hyperlipidemia, HTN, LVH, , presenting to the ED with lightheadedness which arose while standing at work around 5 AM this morning.  Accompanied by diaphoresis and shortness of breath. Symptoms lasted for about an hour.  States he typically eats breakfast every morning, however, this morning he did not eat breakfast.  He took his amlodipine and metoprolol this morning. Denies fever/chills, chest pain, back/neck pain, abdominal pain, N/V/D, syncope, numbness, focal weakness, or any other complaints.       Past Medical History:  Diagnosis Date  . Acute renal failure (HCC) 05/31/2014  . Angina decubitus (HCC) 06/15/2014  . CAD (coronary artery disease)    a. s/p stent RCA DES 9/09; b. 2014 Attempted PCI of OM1 @ [place];  c. 04/2014 Cath: LAD 40-50p, D1 95-99 (chronic), LCX 30-40 inf branch, OM1 CTO, RCA 30-40p, RCA patent stent, EF 35%->Med Rx.  . Chronic combined systolic and diastolic CHF (congestive heart failure) (HCC)    a. EF about 40 to 45% per echo in April 2013;  b. 04/2014 Echo: EF 20-25%, sev LVH, sev glob HK, Gr 1 DD, mildly reduced RV fxn, PASP 38mmHg. c. 01/2017: EF improved to 50-55%.   . Chronic low back pain   . COPD (chronic obstructive pulmonary disease) (HCC) dx 06/2013   PFTs 07/08/13: mod obst with resp to bronchodilator, moderate decrease diffusion, airtrapping  . COPD with acute exacerbation (HCC) 01/17/2017  . COPD with asthma (HCC)    07/08/13 PFT: FEV1 1.74L (66% pred, 30% change with BD), mod obst with resp to bronchodilator, moderate decrease diffusion, air-trapping 11/2013 Simple spiro>> clear obstruction, FEV1 1.30 L (47% pred) - trial of symbicort 160 2bid  01/26/15     . Depression 09/15/2018   Assessment: Increased sadness and depression since losing job  Plan: Patient denies suicidal and homicidal ideations Patient would not like to start medication therapies at this time Will establish patient with [facility] for primary care  . Dyslipidemia    a. on statin  . Essential hypertension   . HLD (hyperlipidemia)   . HTN (hypertension)    a. Reports intolerance to hydralazine; b. no beta blockers 2/2 bradycardia;  c. failed on ACE and ARB.  [name]. 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 LBBB (left bundle branch block)   . LV dysfunction 12/13/2011  . LVH (left ventricular hypertrophy)   . Mixed Ischemic/Non-ischemic Cardiomyopathy    a. 04/2014 Echo: EF 20-25%, sev glob HK.  . Mixed Ischemic/Non-Ischemic Cardiomyopathy    probable mixed ischemic and non-ischemic   . Noncompliance   . NSVT (nonsustained ventricular tachycardia) (HCC)   . OSA (obstructive sleep apnea) 04/18/2018   06/11/16 - home sleep study shows AHI of 2.9 an hour with the lowest SaO2 of 79% with an average of 93%  03/08/2018-Home sleep study-AHI 7/HR, SaO2 low 81%   . Paroxysmal atrial fibrillation (HCC)    a. identified on device interrogation 01/2016  . Pneumonia 09/02/2013  . Pulmonary edema 05/10/2014  . Respiratory arrest (HCC) 05/18/2014  . Sinus bradycardia 05/31/2014  . Syncope 05/30/2014    Patient Active Problem List  Diagnosis Date Noted  . Depression 09/15/2018  . OSA (obstructive sleep apnea) 04/18/2018  . Healthcare maintenance 04/18/2018  . COPD with acute exacerbation (HCC) 01/17/2017  . Allergic rhinitis 01/17/2017  . 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  . Nocturnal hypoxemia 07/03/2016  . Facial abscess 02/04/2016  . Dental abscess   . 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/12/2014  . Angina decubitus (HCC) 06/15/2014  . Acute renal failure (HCC) 05/31/2014  . Sinus bradycardia 05/31/2014  . Syncope 05/30/2014  . Chronic combined systolic and  diastolic CHF (congestive heart failure) (HCC)   . Mixed Ischemic/Non-Ischemic Cardiomyopathy   . NSVT (nonsustained ventricular tachycardia) (HCC)   . HLD (hyperlipidemia)   . Respiratory arrest (HCC) 05/18/2014  . CAD (coronary artery disease) 05/18/2014  . Pulmonary edema 05/10/2014  . HTN (hypertension) 05/08/2014  . LBBB (left bundle branch block) 05/08/2014  . 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  . Low back pain 10/05/2013  . NSVT (nonsustained ventricular tachycardia) (HCC) 09/04/2013  . Pneumonia 09/02/2013  . Flu-like symptoms 09/02/2013  . Anemia 09/02/2013  . COPD with asthma (HCC)   . Fatigue 06/18/2013  . Nocturia 06/18/2013  . Dyspnea 06/18/2013  . LV dysfunction 12/13/2011  . CAD (coronary artery disease) 10/10/2011  . Tobacco abuse 10/10/2011  . Dyslipidemia   . Essential hypertension   . Noncompliance     Past Surgical History:  Procedure Laterality Date  . BI-VENTRICULAR IMPLANTABLE CARDIOVERTER DEFIBRILLATOR N/A 08/12/2014   MDT Ovidio Kin XT CRTD implanted by Dr Johney Frame  . CARDIAC CATHETERIZATION     ejection fraction 50%  . LEFT HEART CATHETERIZATION WITH CORONARY ANGIOGRAM N/A 05/08/2014   Procedure: LEFT HEART CATHETERIZATION WITH CORONARY ANGIOGRAM;  Surgeon: Micheline Chapman, MD;  Location: [facility] CATH LAB;  Service: Cardiovascular;  Laterality: N/A;       Family History  Problem Relation Age of Onset  . Heart attack Neg Hx   . Stroke Neg Hx     Social History   Tobacco Use  . Smoking status: Former Smoker    Packs/day: 0.50    Years: 56.00    Pack years: 28.00    Types: Cigarettes    Quit date: 09/10/2012    Years since quitting: 8.4  . Smokeless tobacco: Never Used  . Tobacco comment: tried 1 month ago (05/2018) but didn't like it   Vaping Use  . Vaping Use: Never used  Substance Use Topics  . Alcohol use: No  . Drug use: No    Home Medications Prior to Admission medications   Medication Sig Start Date End Date Taking? Authorizing  Provider  cephALEXin (KEFLEX) 500 MG capsule Take 1 capsule (500 mg total) by mouth 2 (two) times daily for 5 days. 02/13/21 02/18/21 Yes Miyoshi Ligas C, PA-C  albuterol (PROVENTIL) (2.5 MG/3ML) 0.083% nebulizer solution Take 3 mLs (2.5 mg total) by nebulization every 6 (six) hours as needed for wheezing or shortness of breath. 02/11/18   Coral Ceo, NP  albuterol (VENTOLIN HFA) 108 (90 Base) MCG/ACT inhaler Inhale 1 puff into the lungs every 6 (six) hours as needed for wheezing or shortness of breath. 02/07/21   Sheilah Pigeon, PA-C  amLODipine (NORVASC) 5 MG tablet Take 1 tablet (5 mg total) by mouth daily. Please keep upcoming appt in May 2022 before anymore refills. Thank you 01/19/21   Hillis Range, MD  atorvastatin (LIPITOR) 20 MG tablet Take 1 tablet (20 mg total) by mouth daily. 01/11/20   [place], Peter M, MD  budesonide-formoterol [facility]) 160-4.5 MCG/ACT inhaler TAKE  2 PUFFS BY MOUTH TWICE A DAY 01/03/21   [place], Peter M, MD  clopidogrel (PLAVIX) 75 MG tablet TAKE 1 TABLET DAILY. PLEASE KEEP APPOINTMENT FOR MORE REFILLS. 02/10/21   Hillis Range, MD  isosorbide mononitrate (IMDUR) 60 MG 24 hr tablet Take 1 tablet (60 mg total) by mouth daily. 01/11/20   [place], Peter M, MD  losartan (COZAAR) 100 MG tablet Take 1 tablet (100 mg total) by mouth daily. 01/11/20   [place], Peter M, MD  metoprolol tartrate (LOPRESSOR) 100 MG tablet Take 1 tablet (100 mg total) by mouth 2 (two) times daily. 02/07/21   Sheilah Pigeon, PA-C  nitroGLYCERIN (NITROSTAT) 0.4 MG SL tablet Place 1 tablet (0.4 mg total) under the tongue every 5 (five) minutes as needed for chest pain. 02/07/21 06/28/23  Sheilah Pigeon, PA-C  potassium chloride SA (K-DUR,KLOR-CON) 20 MEQ tablet Take 40 mEq by mouth daily.    [provider]  traMADol (ULTRAM) 50 MG tablet Take 1 tablet (50 mg total) by mouth every 6 (six) hours as needed. 12/14/17   Rise Mu, PA-C    Allergies    Acyclovir and related and Aspirin  Review  of Systems   Review of Systems  Constitutional: Positive for diaphoresis. Negative for chills and fever.  Respiratory: Positive for shortness of breath. Negative for cough.   Cardiovascular: Negative for chest pain and leg swelling.  Gastrointestinal: Negative for abdominal pain, diarrhea, nausea and vomiting.  Musculoskeletal: Negative for back pain.  Neurological: Positive for light-headedness. Negative for syncope, weakness and headaches.  All other systems reviewed and are negative.   Physical Exam Updated Vital Signs BP (!) 172/92   Pulse 62   Temp 98 F (36.7 C) (Oral)   Resp 20   SpO2 99%   Physical Exam Vitals and nursing note reviewed.  Constitutional:      General: He is not in acute distress.    Appearance: He is well-developed. He is not diaphoretic.  HENT:     Head: Normocephalic and atraumatic.     Mouth/Throat:     Mouth: Mucous membranes are moist.     Pharynx: Oropharynx is clear.  Eyes:     Conjunctiva/sclera: Conjunctivae normal.  Cardiovascular:     Rate and Rhythm: Normal rate and regular rhythm.     Pulses: Normal pulses.          Radial pulses are 2+ on the right side and 2+ on the left side.       Posterior tibial pulses are 2+ on the right side and 2+ on the left side.     Heart sounds: Normal heart sounds.     Comments: Tactile temperature in the extremities appropriate and equal bilaterally. Pulmonary:     Effort: Pulmonary effort is normal. No respiratory distress.     Breath sounds: Normal breath sounds.  Abdominal:     Palpations: Abdomen is soft.     Tenderness: There is no abdominal tenderness. There is no guarding.  Musculoskeletal:     Cervical back: Neck supple.     Right lower leg: No edema.     Left lower leg: No edema.  Skin:    General: Skin is warm and dry.  Neurological:     Mental Status: He is alert and oriented to person, place, and time.     Comments: No noted acute cognitive deficit. Sensation grossly intact to light  touch in the extremities.   Grip strengths equal bilaterally.   Strength  5/5 in all extremities.  No gait disturbance.  Coordination intact.  Cranial nerves III-XII grossly intact.  Handles oral secretions without noted difficulty.  No noted phonation or speech deficit. No facial droop.   Psychiatric:        Mood and Affect: Mood and affect normal.        Speech: Speech normal.        Behavior: Behavior normal.     ED Results / Procedures / Treatments   Labs (all labs ordered are listed, but only abnormal results are displayed) Labs Reviewed  COMPREHENSIVE METABOLIC PANEL - Abnormal; Notable for the following components:      Result Value   Total Bilirubin 1.3 (*)    All other components within normal limits  CBC WITH DIFFERENTIAL/PLATELET - Abnormal; Notable for the following components:   Hemoglobin 12.8 (*)    All other components within normal limits  URINALYSIS, ROUTINE W REFLEX MICROSCOPIC - Abnormal; Notable for the following components:   APPearance HAZY (*)    Leukocytes,Ua SMALL (*)    Bacteria, UA MANY (*)    All other components within normal limits  TROPONIN I (HIGH SENSITIVITY) - Abnormal; Notable for the following components:   Troponin I (High Sensitivity) 41 (*)    All other components within normal limits  TROPONIN I (HIGH SENSITIVITY) - Abnormal; Notable for the following components:   Troponin I (High Sensitivity) 38 (*)    All other components within normal limits  URINE CULTURE    EKG EKG Interpretation  Date/Time:  Monday February 13 2021 06:57:41 EDT Ventricular Rate:  70 PR Interval:  168 QRS Duration: 160 QT Interval:  474 QTC Calculation: 512 R Axis:   269 Text Interpretation: Electronic ventricular pacemaker Confirmed by Cathren Laine ([id number]) on 02/13/2021 8:01:56 AM Also confirmed by Cathren Laine ([id number]), editor Elita Quick ([id number])  on 02/13/2021 9:17:53 AM   Radiology DG Chest 2 View  Result Date: 02/13/2021 CLINICAL DATA:   80 year old male with dizziness. EXAM: CHEST - 2 VIEW COMPARISON:  01/12/2020 FINDINGS: The mediastinal contours are within normal limits. Unchanged mild cardiomegaly. Left chest wall, subclavian approach biventricular pacemaker with leads in unchanged positions. Similar appearing flattening of the hemidiaphragms and hyperinflation of the lungs bilaterally. No focal consolidations, pneumothorax, or evidence of pleural effusion. Atherosclerotic calcification of the aortic arch. No acute osseous abnormality. IMPRESSION: 1. No acute cardiopulmonary process. 2. Aortic Atherosclerosis (ICD10-I70.0) and Emphysema (ICD10-J43.9). Electronically Signed   By: Marliss Coots MD   On: 02/13/2021 08:15     From Cardiology note on 02/07/21: Device interrogation done today and reviewed by myself:  Battery etimate to ERI is 92mo Lead measurements are good Many NSVT episodes AT/AF labeled episodes as well. NSVTs are true and not new for him, (471 since March 2020) No treated VT In review of available EGMs for his AF episodes, I think these are farfield perhaps not true Afib   01/28/2020: TTE IMPRESSIONS  1. Left ventricular ejection fraction, by estimation, is 50 to 55%. The  left ventricle has low normal function. The left ventricle has no regional  wall motion abnormalities. There is moderate concentric left ventricular  hypertrophy. Left ventricular  diastolic parameters are consistent with Grade I diastolic dysfunction  (impaired relaxation).  2. Right ventricular systolic function is normal. The right ventricular  size is normal.  3. Left atrial size was moderately dilated.  4. The mitral valve is normal in structure. Mild mitral valve  regurgitation. No evidence of mitral stenosis.  5. The aortic valve is tricuspid. Aortic valve regurgitation is not  visualized. No aortic stenosis is present.  6. The inferior vena cava is normal in size with greater than 50%  respiratory variability, suggesting  right atrial pressure of 3 mmHg.    07/05/2016: CPX Conclusion: The interpretation of this test is limited due to submaximal effort during the exercise. Based on available data, exercise testing with gas exchange demonstrates moderate functional impairment. Pre-exercise spirometry suggests mild obstructive pattern. With significantly hypertensive response to exercise, flat O2 pulse and elevated VE/VCO2 slope patient is most likely limited due to diastolic dysfunction and possible early pulmonary vascular disease.   Test, report and preliminary impression by:  Lesia Hausen, MS, ACSM-RCEP  07/05/2016 9:46 AM  Preliminary CPX Results, Finalized results will be forwarded when completed by interpreting physician.   Agree with above. This is submax test with mild functional limitation that is multifactorial in nature including COPD, deconditioning and severe hypertensive response with mild circulatory limitation. Would consider exercise training program and tighter BP control.    02/20/2016: stress myoview  Nuclear stress EF: 39%. Diffuse hypokinesis with asynchronous contraction consistent with pacemaker  There was no ST segment deviation noted during stress.  Defect 1: There is a small defect of moderate severity present in the mid inferolateral location. Question possible focal lateral infarction.  Findings consistent with prior myocardial infarction. No areas of ischemia identified.  This is an intermediate risk study.     *We were told the rest of the full report was in the process of being faxed, however, I never received this report.*  Procedures Procedures   Medications Ordered in ED Medications  aspirin chewable tablet 324 mg (324 mg Oral Given 02/13/21 1013)    ED Course  I have reviewed the triage vital signs and the nursing notes.  Pertinent labs & imaging results that were available during my care of the patient were reviewed by me and considered in my medical  decision making (see chart for details).  Clinical Course as of 02/13/21 1613  Mon Feb 13, 2021  1005 Urinalysis, Routine w reflex microscopic Urine, Clean Catch(!) Patient denies any urinary symptoms. [SJ]  1243 Spoke with Annabelle Harman, cardiology Child psychotherapist.  Discussed patient's symptoms.  States she will get in contact with one of the cardiologists. [SJ]  1300 Spoke with Dr. [place], patient's cardiologist. We reviewed the patient's symptoms, lab findings (including the patient's troponin), and proposed plan. He states if the pacemaker interrogation is unremarkable, patient may be discharged for office follow-up. [SJ]  1515 Updated Dr. [place] on [employer] interrogation results.  He agrees none of the abnormalities would be causing patient to have symptoms.  Patient may be discharged and can follow-up in the office. [SJ]    Clinical Course User Index [SJ] Micheale Schlack, Hillard Danker, PA-C   MDM Rules/Calculators/A&P                          Patient presents following an episode of lightheadedness that spontaneously resolved and did not recur. Patient is nontoxic appearing, afebrile, not tachycardic, not tachypneic, not hypotensive, maintains excellent SPO2 on room air, and is in no apparent distress.   I have reviewed the patient's chart to obtain more information.   I reviewed and interpreted the patient's labs and radiological studies.  Return precautions discussed.  Patient voices understanding of these instructions, accepts the plan, and is comfortable with discharge.    Findings and plan of care discussed  with attending physician, Cathren Laine, MD. Dr. Denton Lank personally evaluated and examined this patient.   Vitals:   02/13/21 0930 02/13/21 1000 02/13/21 1028 02/13/21 1030  BP: (!) 152/92 (!) 164/93  (!) 159/83  Pulse: (!) 59 (!) 58 61 (!) 58  Resp: 15 19 19 17   Temp:      TempSrc:      SpO2: 98% 98% 98% 97%     Final Clinical Impression(s) / ED Diagnoses Final diagnoses:  Lightheadedness     Rx / DC Orders ED Discharge Orders         Ordered    cephALEXin (KEFLEX) 500 MG capsule  2 times daily        02/13/21 1351           04/15/21, PA-C 02/13/21 1615    04/15/21, MD 02/13/21 1727

## 2021-02-13 NOTE — ED Notes (Signed)
Pacemaker interrogated. 

## 2021-02-13 NOTE — ED Notes (Signed)
[employer] called RN, pt defibrillator has 49mo battery life, and pt had one 5 beat VT run on 02/12/21 at 1600. PA Joy aware. [employer] to fax report

## 2021-02-13 NOTE — ED Triage Notes (Addendum)
Pt BIB EMS from work due to near syncopal episode. Pt reports that he was just getting to work when he felt he was going to pass out. Pt reports he got diaphoretic and felt the same way like when he had his MI in the pass. Pt has defibrillator. EMS reported pt was hypertensive on arrival .

## 2021-02-15 LAB — URINE CULTURE: Culture: >=100000 — AB

## 2021-02-16 ENCOUNTER — Telehealth: Payer: Self-pay | Admitting: *Deleted

## 2021-02-16 NOTE — Telephone Encounter (Signed)
Post ED Visit - Positive Culture Follow-up  Culture report reviewed by antimicrobial stewardship pharmacist: [name] []  , Pharm.D. []  Enzo Bi, Pharm.D., BCPS AQ-ID []  , Pharm.D., BCPS []  Celedonio Miyamoto, .D., BCPS []  [place], .D., BCPS, AAHIVP []  Georgina Pillion, Pharm.D., BCPS, AAHIVP []  [address], PharmD, BCPS []  , PharmD, BCPS []  [place], PharmD, BCPS []  [address], PharmD []  , PharmD, BCPS []  Estella Husk, PharmD  Pharmacy Team []  Lysle Pearl, PharmD []  , PharmD []  Phillips Climes, PharmD []  , Rph []  Agapito Games) , PharmD []  Verlan Friends, PharmD []  , PharmD []  Mervyn Gay, PharmD []  , PharmD []  Vinnie Level, PharmD []  [facility], PharmD []  , PharmD []  Len Childs, PharmD   Positive urine culture Treated with Cephalexin, organism sensitive to the same and no further patient follow-up is required at this time.  [facility] 02/16/2021, 9:11 AM

## 2021-02-17 NOTE — Progress Notes (Signed)
[name] Date of Birth: 12/02/1940 Medical Record #[id number]  History of Present Illness: [name] is seen for follow up of CAD and CHF. He has multiple medical issues which include HTN,  HLD and known CAD. He has a history of tobacco abuse. He has LV dysfunction with ejection fraction of 40-45% in April 2013.  Not able to take higher beta blocker dose due to his resting bradycardia.  He had a Myoview study in March of 2013 which showed normal perfusion. Ejection fraction of 40%. He was admitted to [facility] in December 2014 with chest pain. Cardiac cath showed patent RCA stent. There was a 50% mid LAD lesion. The first diagonal and first OM were occluded. Attempted PCI of the OM but unable to cross with wire. Review of old films here showed that this was a chronic obstruction. He has a history of NSVT. He was admitted in  August 2015 with VDRF with acute pulmonary edema. Echo showed drop in EF to 20-25%. In September 2015 he was admitted with syncope associated with bradycardia and overdiuresis. Diruetics were reduced and coreg was held. Repeat Echo in November 2015 showed EF of 30%. He did undergo ICD/BiV implant  by Dr. Johney Frame.  In 2017  he complained of dyspnea on exertion. No clinical evidence of CHF. Echo showed EF 45-50%. Global HK. Mild MR. Myoview study showed a fixed inferolateral infarct without ischemia. EF 39%. He ultimately underwent CPX. This was limited by submaximal exercise and severe hypertensive response. There was mild COPD. Exercise training and improved BP control recommended. Toprol was increased.   Followed regularly in EP clinic for his ICD. Last Echo in 2019 showed EF 45% with moderate LVH  When seen in May 2021 noted more SOB. CXR was clear. BNP 118. Echo showed improved EF to 50-55%. No significant valvular disease.   Seen in the ED on 02/13/21 with dizziness. Patient states he was at work and almost passed out. Evaluation was negative except for urine  culture which grew Staph aureus. Treated with antibiotics. Device check showed brief NSVT that did not correlate with symptoms. He is nearing ERI. He was hypertensive.   On follow up today he states dizziness resolved. No has occasional pain in hip, elbow and sometimes mid chest. Wants tramadol for this. Quit taking lipitor about a year ago. Not smoking.     Current Outpatient Medications on File Prior to Visit  Medication Sig Dispense Refill   albuterol (PROVENTIL) (2.5 MG/3ML) 0.083% nebulizer solution Take 3 mLs (2.5 mg total) by nebulization every 6 (six) hours as needed for wheezing or shortness of breath. 360 mL 11   albuterol (VENTOLIN HFA) 108 (90 Base) MCG/ACT inhaler Inhale 1 puff into the lungs every 6 (six) hours as needed for wheezing or shortness of breath. 1 each 0   amLODipine (NORVASC) 5 MG tablet Take 1 tablet (5 mg total) by mouth daily. Please keep upcoming appt in May 2022 before anymore refills. Thank you 90 tablet 0   clopidogrel (PLAVIX) 75 MG tablet TAKE 1 TABLET DAILY. PLEASE KEEP APPOINTMENT FOR MORE REFILLS. 90 tablet 3   isosorbide mononitrate (IMDUR) 60 MG 24 hr tablet Take 1 tablet (60 mg total) by mouth daily. 90 tablet 2   losartan (COZAAR) 100 MG tablet Take 1 tablet (100 mg total) by mouth daily. 90 tablet 3   metoprolol tartrate (LOPRESSOR) 100 MG tablet Take 1 tablet (100 mg total) by mouth 2 (two) times daily. 180 tablet 1  nitroGLYCERIN (NITROSTAT) 0.4 MG SL tablet Place 1 tablet (0.4 mg total) under the tongue every 5 (five) minutes as needed for chest pain. 25 tablet 3   potassium chloride SA (K-DUR,KLOR-CON) 20 MEQ tablet Take 40 mEq by mouth daily.     traMADol (ULTRAM) 50 MG tablet Take 1 tablet (50 mg total) by mouth every 6 (six) hours as needed. 10 tablet 0   No current facility-administered medications on file prior to visit.    Allergies  Allergen Reactions   Acyclovir And Related     unknown   Aspirin Other (See Comments)    GI upset at  high doses only.    Past Medical History:  Diagnosis Date   Acute renal failure (HCC) 05/31/2014   Angina decubitus (HCC) 06/15/2014   CAD (coronary artery disease)    a. s/p stent RCA DES 9/09; b. 2014 Attempted PCI of OM1 @ [place];  c. 04/2014 Cath: LAD 40-50p, D1 95-99 (chronic), LCX 30-40 inf branch, OM1 CTO, RCA 30-40p, RCA patent stent, EF 35%->Med Rx.   Chronic combined systolic and diastolic CHF (congestive heart failure) (HCC)    a. EF about 40 to 45% per echo in April 2013;  b. 04/2014 Echo: EF 20-25%, sev LVH, sev glob HK, Gr 1 DD, mildly reduced RV fxn, PASP 38mmHg. c. 01/2017: EF improved to 50-55%.    Chronic low back pain    COPD (chronic obstructive pulmonary disease) (HCC) dx 06/2013   PFTs 07/08/13: mod obst with resp to bronchodilator, moderate decrease diffusion, airtrapping   COPD with acute exacerbation (HCC) 01/17/2017   COPD with asthma (HCC)    07/08/13 PFT: FEV1 1.74L (66% pred, 30% change with BD), mod obst with resp to bronchodilator, moderate decrease diffusion, air-trapping 11/2013 Simple spiro>> clear obstruction, FEV1 1.30 L (47% pred) - trial of symbicort 160 2bid 01/26/15      Depression 09/15/2018   Assessment: Increased sadness and depression since losing job  Plan: Patient denies suicidal and homicidal ideations Patient would not like to start medication therapies at this time Will establish patient with [facility] for primary care   Dyslipidemia    a. on statin   Essential hypertension    HLD (hyperlipidemia)    HTN (hypertension)    a. Reports intolerance to hydralazine; b. no beta blockers 2/2 bradycardia;  c. failed on ACE and ARB.   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 04/2014 Echo: EF 20-25%, sev glob HK.   Mixed Ischemic/Non-Ischemic Cardiomyopathy    probable mixed ischemic and  non-ischemic    Noncompliance    NSVT (nonsustained ventricular tachycardia) (HCC)    OSA (obstructive sleep apnea) 04/18/2018   06/11/16 - home sleep study shows AHI of 2.9 an hour with the lowest SaO2 of 79% with an average of 93%  03/08/2018-Home sleep study-AHI 7/HR, SaO2 low 81%    Paroxysmal atrial fibrillation (HCC)    a. identified on device interrogation 01/2016   Pneumonia 09/02/2013   Pulmonary edema 05/10/2014   Respiratory arrest (HCC) 05/18/2014   Sinus bradycardia 05/31/2014   Syncope 05/30/2014    Past Surgical History:  Procedure Laterality Date   BI-VENTRICULAR IMPLANTABLE CARDIOVERTER DEFIBRILLATOR N/A 08/12/2014   MDT Viva Quad XT CRTD implanted by Dr Johney FrameAllred   CARDIAC CATHETERIZATION     ejection fraction 50%  LEFT HEART CATHETERIZATION WITH CORONARY ANGIOGRAM N/A 05/08/2014   Procedure: LEFT HEART CATHETERIZATION WITH CORONARY ANGIOGRAM;  Surgeon: Micheline Chapman, MD;  Location: [facility] CATH LAB;  Service: Cardiovascular;  Laterality: N/A;    Social History   Tobacco Use  Smoking Status Former   Packs/day: 0.50   Years: 56.00   Pack years: 28.00   Types: Cigarettes   Quit date: 09/10/2012   Years since quitting: 8.4  Smokeless Tobacco Never  Tobacco Comments   tried 1 month ago (05/2018) but didn't like it     Social History   Substance and Sexual Activity  Alcohol Use No    Family History  Problem Relation Age of Onset   Heart attack Neg Hx    Stroke Neg Hx     Review of Systems: As noted in history of present illness  All other systems were reviewed and are negative.  Physical Exam: BP (!) 144/96 (BP Location: Left Arm, Patient Position: Sitting, Cuff Size: Normal)   Pulse 67   Ht 5' 8.5" (1.74 m)   Wt 152 lb 3.2 oz (69 kg)   SpO2 97%   BMI 22.81 kg/m  GENERAL:  Well appearing BM in NAD HEENT:  PERRL, EOMI, sclera are clear. Oropharynx is clear. NECK:  No jugular venous distention, carotid upstroke brisk and symmetric, no bruits, no thyromegaly or  adenopathy LUNGS:  Clear to auscultation bilaterally CHEST:  Unremarkable HEART:  RRR,  PMI not displaced or sustained,S1 and S2 within normal limits, no S3, no S4: no clicks, no rubs, no murmurs ABD:  Soft, nontender. BS +, no masses or bruits. No hepatomegaly, no splenomegaly EXT:  2 + pulses throughout, no edema, no cyanosis no clubbing SKIN:  Warm and dry.  No rashes NEURO:  Alert and oriented x 3. Cranial nerves II through XII intact. PSYCH:  Cognitively intact     LABORATORY DATA:   Lab Results  Component Value Date   WBC 6.8 02/20/2021   HGB 11.8 (L) 02/20/2021   HCT 37.0 (L) 02/20/2021   PLT 159 02/20/2021   GLUCOSE 78 02/20/2021   CHOL 212 (H) 02/20/2021   TRIG 80 02/20/2021   HDL 65 02/20/2021   LDLDIRECT 132.9 06/18/2013   LDLCALC 133 (H) 02/20/2021   ALT 14 02/20/2021   AST 18 02/20/2021   NA 143 02/20/2021   K 3.7 02/20/2021   CL 104 02/20/2021   CREATININE 0.85 02/20/2021   BUN 12 02/20/2021   CO2 23 02/20/2021   TSH 2.67 01/31/2016   INR 1.09 06/20/2014    Ecg today shows AV pacing BI V rate 67. I have personally reviewed and interpreted this study.  Echo: 02/20/16:Study Conclusions   - Left ventricle: There was moderate concentric hypertrophy.   Systolic function was mildly reduced. The estimated ejection   fraction was in the range of 45% to 50%. Mild diffuse hypokinesis   with no identifiable regional variations. Abnormal relaxation   with increased filling pressures. - Ventricular septum: Septal motion showed paradox. These changes   are consistent with right ventricular pacing. - Mitral valve: There was mild regurgitation  Myoview 02/20/16: Study Highlights   Nuclear stress EF: 39%. Diffuse hypokinesis with asynchronous contraction consistent with pacemaker There was no ST segment deviation noted during stress. Defect 1: There is a small defect of moderate severity present in the mid inferolateral location. Question possible focal lateral  infarction. Findings consistent with prior myocardial infarction. No areas of ischemia identified. This is an intermediate  risk study.   Donato Schultz, MD   CPX: 07/05/16: Narrative   Referred for: Exertional Dyspnea   Procedure: This patient underwent staged symptom-limited exercise treadmill testing using a modified Naughton protocol with expired gas analysis metabolic evaluation during exercise.  Demographics  Age: 26 Ht. (in.) 68.5 Wt. (lb) 151 BMI: 22.6      Predicted Peak VO2: 26.6  Gender: Male Ht (cm) 174 Wt. (kg) 68.5    Results  Pre-Exercise PFTs   FVC 2.70 (81%)       FEV1 1.64 (64%)         FEV1/FVC 61 (79%)         MVV 70 (58%)         Exercise Time:    9:06   Speed (mph): 2.0       Grade (%): 10.5      RPE: 17  Reason stopped: Test was ended due to hypertensive response achieving termination criteria prior to maximal exercise.  Additional symptoms: bilateral hip pain (5/10) dyspnea (3/10)  Resting HR: 65 Peak HR: 127   (86% age predicted max HR)  BP rest: 180/66 BP peak: 246/94  Peak VO2: 17.5 (66% predicted peak VO2)  VE/VCO2 slope:  33.4  OUES: 1.94  Peak RER: 0.98  Ventilatory Threshold: 16.5 (62% predicted or measured peak VO2)  Peak RR 34  Peak Ventilation:  44.3  VE/MVV:  63%  PETCO2 at peak:  31  O2pulse:  9   (75% predicted O2pulse)   Interpretation  Notes: Patient gave a very good effort. Pulse-oximetry remained 96% or above for the duration of exercise.   ECG:  Resting ECG AV paced rhythm. HR response appropriate. PVCs occasional. There were no sustained arrhythmias and no ST-T changes. BP hypertensive at rest with increasingly significant hypertensive response to incremental exercise.  PFT:  Pre-exercise spirometry suggests possible mild restrictive/obstructive patterns. MVV below normal.   CPX:  Exercise testing with gas exchange demonstrates a moderately reduced peak VO2 of 17.5 ml/kg/min (66% of the age/gender/weight  matched sedentary norms). The RER of 0.98 indicates a submaximal effort. The VE/VCO2 slope is mildly elevated and indicates increased dead space ventiltion. The oxygen uptake efficiency slope (OUES) is moderately reduced. The VO2 at the ventilatory threshold was normal at 62% of the predicted peak VO2. At peak exercise, the ventilation reached 63% of the measured MVV indicating ventilatory reserve remained. The O2pulse (a surrogate for stroke volume) increased with initial exercise but remained flat at 9 ml/beeat (75% predicted).   Conclusion: The interpretation of this test is limited due to submaximal effort during the exercise. Based on available data, exercise testing with gas exchange demonstrates moderate functional impairment. Pre-exercise spirometry suggests mild obstructive pattern. With significantly hypertensive response to exercise, flat O2 pulse and elevated VE/VCO2 slope patient is most likely limited due to diastolic dysfunction and possible early pulmonary vascular disease.    Test, report and preliminary impression by: Lesia Hausen, MS, ACSM-RCEP 07/05/2016 9:46 AM  Preliminary CPX Results, Finalized results will be forwarded when completed by interpreting physician.   Agree with above. This is submax test with mild functional limitation that is multifactorial in nature including COPD, deconditioning and severe hypertensive response with mild circulatory limitation.  Would consider exercise training program and tighter BP control.   Bensimhon, Daniel,MD 10:24 PM    Echo 08/04/18: Study Conclusions   - Left ventricle: The cavity size was normal. There was severe   concentric hypertrophy. Doppler parameters are consistent with   abnormal  left ventricular relaxation (grade 1 diastolic   dysfunction). - Mitral valve: There was mild to moderate regurgitation. - Right ventricle: Systolic function was mildly reduced. - Pericardium, extracardiac: A trivial pericardial effusion  was   identified.  Echo 01/28/20: IMPRESSIONS     1. Left ventricular ejection fraction, by estimation, is 50 to 55%. The  left ventricle has low normal function. The left ventricle has no regional  wall motion abnormalities. There is moderate concentric left ventricular  hypertrophy. Left ventricular  diastolic parameters are consistent with Grade I diastolic dysfunction  (impaired relaxation).   2. Right ventricular systolic function is normal. The right ventricular  size is normal.   3. Left atrial size was moderately dilated.   4. The mitral valve is normal in structure. Mild mitral valve  regurgitation. No evidence of mitral stenosis.   5. The aortic valve is tricuspid. Aortic valve regurgitation is not  visualized. No aortic stenosis is present.   6. The inferior vena cava is normal in size with greater than 50%  respiratory variability, suggesting right atrial pressure of 3 mmHg.  Assessment / Plan: 1. Chronic systolic and diastolic CHF with mixed cardiomyopathy. Last EF 50-55%. Volume status looks good.Continue current therapy   2. Coronary disease with remote stenting of the right coronary in 2009 with a drug-eluting stent. Chronic occlusion of OM. He has no significant angina. Myoview study 2017 showed no ischemia.  3. Tobacco abuse. States his is no longer smoking. Continue Symbicort for COPD  4. Hyperlipidemia  - stressed importance of taking statin. Will renew lipitor. Repeat lab in 6 months.  5. COPD with asthma.  6. HTN   7. NSVT s/p ICD. Asymptomatic.  8. S/p ICD/BiV pacemaker. Plans change out for ERI in September.     Follow up in 6 months.

## 2021-02-20 ENCOUNTER — Other Ambulatory Visit: Payer: Self-pay

## 2021-02-20 DIAGNOSIS — I255 Ischemic cardiomyopathy: Secondary | ICD-10-CM

## 2021-02-20 DIAGNOSIS — Z79899 Other long term (current) drug therapy: Secondary | ICD-10-CM

## 2021-02-21 LAB — COMPREHENSIVE METABOLIC PANEL
ALT: 14 IU/L (ref 0–44)
AST: 18 IU/L (ref 0–40)
Albumin/Globulin Ratio: 1.8 (ref 1.2–2.2)
Albumin: 4.5 g/dL (ref 3.7–4.7)
Alkaline Phosphatase: 92 IU/L (ref 44–121)
BUN/Creatinine Ratio: 14 (ref 10–24)
BUN: 12 mg/dL (ref 8–27)
Bilirubin Total: 0.4 mg/dL (ref 0.0–1.2)
CO2: 23 mmol/L (ref 20–29)
Calcium: 9.4 mg/dL (ref 8.6–10.2)
Chloride: 104 mmol/L (ref 96–106)
Creatinine, Ser: 0.85 mg/dL (ref 0.76–1.27)
Globulin, Total: 2.5 g/dL (ref 1.5–4.5)
Glucose: 78 mg/dL (ref 65–99)
Potassium: 3.7 mmol/L (ref 3.5–5.2)
Sodium: 143 mmol/L (ref 134–144)
Total Protein: 7 g/dL (ref 6.0–8.5)
eGFR: 89 mL/min/{1.73_m2} (ref >59)

## 2021-02-21 LAB — CBC
Hematocrit: 37 % — ABNORMAL LOW (ref 37.5–51.0)
Hemoglobin: 11.8 g/dL — ABNORMAL LOW (ref 13.0–17.7)
MCH: 27.6 pg (ref 26.6–33.0)
MCHC: 31.9 g/dL (ref 31.5–35.7)
MCV: 86 fL (ref 79–97)
Platelets: 159 10*3/uL (ref 150–450)
RBC: 4.28 x10E6/uL (ref 4.14–5.80)
RDW: 13.7 % (ref 11.6–15.4)
WBC: 6.8 10*3/uL (ref 3.4–10.8)

## 2021-02-21 LAB — MAGNESIUM: Magnesium: 1.8 mg/dL (ref 1.6–2.3)

## 2021-02-21 LAB — LIPID PANEL
Chol/HDL Ratio: 3.3 ratio (ref 0.0–5.0)
Cholesterol, Total: 212 mg/dL — ABNORMAL HIGH (ref 100–199)
HDL: 65 mg/dL (ref >39)
LDL Chol Calc (NIH): 133 mg/dL — ABNORMAL HIGH (ref 0–99)
Triglycerides: 80 mg/dL (ref 0–149)
VLDL Cholesterol Cal: 14 mg/dL (ref 5–40)

## 2021-02-26 ENCOUNTER — Other Ambulatory Visit: Payer: Self-pay | Admitting: Cardiology

## 2021-02-26 DIAGNOSIS — J449 Chronic obstructive pulmonary disease, unspecified: Secondary | ICD-10-CM

## 2021-02-28 ENCOUNTER — Encounter: Payer: Self-pay | Admitting: Cardiology

## 2021-02-28 ENCOUNTER — Ambulatory Visit (INDEPENDENT_AMBULATORY_CARE_PROVIDER_SITE_OTHER): Payer: Self-pay | Admitting: Cardiology

## 2021-02-28 ENCOUNTER — Other Ambulatory Visit: Payer: Self-pay

## 2021-02-28 VITALS — BP 144/96 | HR 67 | Ht 68.5 in | Wt 152.2 lb

## 2021-02-28 DIAGNOSIS — J449 Chronic obstructive pulmonary disease, unspecified: Secondary | ICD-10-CM

## 2021-02-28 DIAGNOSIS — I25118 Atherosclerotic heart disease of native coronary artery with other forms of angina pectoris: Secondary | ICD-10-CM

## 2021-02-28 DIAGNOSIS — I5042 Chronic combined systolic (congestive) and diastolic (congestive) heart failure: Secondary | ICD-10-CM

## 2021-02-28 DIAGNOSIS — I255 Ischemic cardiomyopathy: Secondary | ICD-10-CM

## 2021-02-28 MED ORDER — ATORVASTATIN CALCIUM 20 MG PO TABS: 20.0000 mg | ORAL_TABLET | Freq: Every day | ORAL | 3 refills | Status: DC

## 2021-02-28 MED ORDER — BUDESONIDE-FORMOTEROL FUMARATE 160-4.5 MCG/ACT IN AERO: INHALATION_SPRAY | RESPIRATORY_TRACT | 3 refills | Status: DC

## 2021-03-01 ENCOUNTER — Other Ambulatory Visit: Payer: Self-pay | Admitting: Physician Assistant

## 2021-03-01 DIAGNOSIS — J449 Chronic obstructive pulmonary disease, unspecified: Secondary | ICD-10-CM

## 2021-03-06 NOTE — Addendum Note (Signed)
Addended by: Elease Etienne A on: 03/06/2021 11:02 AM   Modules accepted: Level of Service

## 2021-03-06 NOTE — Progress Notes (Signed)
Remote ICD transmission.   

## 2021-03-08 ENCOUNTER — Other Ambulatory Visit: Payer: Self-pay | Admitting: Cardiology

## 2021-03-10 NOTE — Telephone Encounter (Signed)
Spoke to patient he stated he is taking Metoprolol Tart.100 mg twice a day.Stated he does not need a refill at this time.

## 2021-03-14 ENCOUNTER — Ambulatory Visit (INDEPENDENT_AMBULATORY_CARE_PROVIDER_SITE_OTHER): Payer: Medicare Other

## 2021-03-14 DIAGNOSIS — I472 Ventricular tachycardia, unspecified: Secondary | ICD-10-CM

## 2021-03-15 LAB — CUP PACEART REMOTE DEVICE CHECK
Battery Remaining Longevity: 3 mo
Battery Voltage: 2.77 V
Brady Statistic AP VP Percent: 77.88 %
Brady Statistic AP VS Percent: 0.06 %
Brady Statistic AS VP Percent: 21.7 %
Brady Statistic AS VS Percent: 0.35 %
Brady Statistic RA Percent Paced: 75.87 %
Brady Statistic RV Percent Paced: 98.89 %
Date Time Interrogation Session: 20220705193725
HighPow Impedance: 69 Ohm
Implantable Lead Implant Date: 20151203
Implantable Lead Implant Date: 20151203
Implantable Lead Implant Date: 20151203
Implantable Lead Location: 753858
Implantable Lead Location: 753859
Implantable Lead Location: 753860
Implantable Lead Model: 4598
Implantable Lead Model: 5076
Implantable Lead Model: 6935
Implantable Pulse Generator Implant Date: 20151203
Lead Channel Impedance Value: 304 Ohm
Lead Channel Impedance Value: 342 Ohm
Lead Channel Impedance Value: 342 Ohm
Lead Channel Impedance Value: 342 Ohm
Lead Channel Impedance Value: 380 Ohm
Lead Channel Impedance Value: 380 Ohm
Lead Channel Impedance Value: 399 Ohm
Lead Channel Impedance Value: 437 Ohm
Lead Channel Impedance Value: 589 Ohm
Lead Channel Impedance Value: 589 Ohm
Lead Channel Impedance Value: 589 Ohm
Lead Channel Impedance Value: 589 Ohm
Lead Channel Impedance Value: 627 Ohm
Lead Channel Pacing Threshold Amplitude: 0.625 V
Lead Channel Pacing Threshold Amplitude: 1.125 V
Lead Channel Pacing Threshold Amplitude: 1.5 V
Lead Channel Pacing Threshold Pulse Width: 0.4 ms
Lead Channel Pacing Threshold Pulse Width: 0.4 ms
Lead Channel Pacing Threshold Pulse Width: 0.4 ms
Lead Channel Sensing Intrinsic Amplitude: 0.625 mV
Lead Channel Sensing Intrinsic Amplitude: 0.625 mV
Lead Channel Sensing Intrinsic Amplitude: 26.375 mV
Lead Channel Sensing Intrinsic Amplitude: 26.375 mV
Lead Channel Setting Pacing Amplitude: 1.5 V
Lead Channel Setting Pacing Amplitude: 2.25 V
Lead Channel Setting Pacing Amplitude: 3 V
Lead Channel Setting Pacing Pulse Width: 0.4 ms
Lead Channel Setting Pacing Pulse Width: 0.4 ms
Lead Channel Setting Sensing Sensitivity: 0.3 mV

## 2021-04-03 NOTE — Addendum Note (Signed)
Addended by: Geralyn Flash D on: 04/03/2021 02:03 PM   Modules accepted: Level of Service

## 2021-04-03 NOTE — Progress Notes (Signed)
Remote ICD transmission.   

## 2021-04-14 ENCOUNTER — Ambulatory Visit (INDEPENDENT_AMBULATORY_CARE_PROVIDER_SITE_OTHER): Payer: Self-pay

## 2021-04-14 DIAGNOSIS — I472 Ventricular tachycardia, unspecified: Secondary | ICD-10-CM

## 2021-04-17 LAB — CUP PACEART REMOTE DEVICE CHECK
Battery Remaining Longevity: 1 mo
Battery Voltage: 2.73 V
Brady Statistic AP VP Percent: 79.69 %
Brady Statistic AP VS Percent: 0.02 %
Brady Statistic AS VP Percent: 19.29 %
Brady Statistic AS VS Percent: 1 %
Brady Statistic RA Percent Paced: 76.49 %
Brady Statistic RV Percent Paced: 95.9 %
Date Time Interrogation Session: 20220806062209
HighPow Impedance: 65 Ohm
Implantable Lead Implant Date: 20151203
Implantable Lead Implant Date: 20151203
Implantable Lead Implant Date: 20151203
Implantable Lead Location: 753858
Implantable Lead Location: 753859
Implantable Lead Location: 753860
Implantable Lead Model: 4598
Implantable Lead Model: 5076
Implantable Lead Model: 6935
Implantable Pulse Generator Implant Date: 20151203
Lead Channel Impedance Value: 266 Ohm
Lead Channel Impedance Value: 323 Ohm
Lead Channel Impedance Value: 323 Ohm
Lead Channel Impedance Value: 323 Ohm
Lead Channel Impedance Value: 342 Ohm
Lead Channel Impedance Value: 380 Ohm
Lead Channel Impedance Value: 380 Ohm
Lead Channel Impedance Value: 380 Ohm
Lead Channel Impedance Value: 532 Ohm
Lead Channel Impedance Value: 570 Ohm
Lead Channel Impedance Value: 570 Ohm
Lead Channel Impedance Value: 570 Ohm
Lead Channel Impedance Value: 570 Ohm
Lead Channel Pacing Threshold Amplitude: 0.75 V
Lead Channel Pacing Threshold Amplitude: 1.25 V
Lead Channel Pacing Threshold Amplitude: 1.5 V
Lead Channel Pacing Threshold Pulse Width: 0.4 ms
Lead Channel Pacing Threshold Pulse Width: 0.4 ms
Lead Channel Pacing Threshold Pulse Width: 0.4 ms
Lead Channel Sensing Intrinsic Amplitude: 0.875 mV
Lead Channel Sensing Intrinsic Amplitude: 0.875 mV
Lead Channel Sensing Intrinsic Amplitude: 8.75 mV
Lead Channel Sensing Intrinsic Amplitude: 8.75 mV
Lead Channel Setting Pacing Amplitude: 1.5 V
Lead Channel Setting Pacing Amplitude: 2.25 V
Lead Channel Setting Pacing Amplitude: 3.25 V
Lead Channel Setting Pacing Pulse Width: 0.4 ms
Lead Channel Setting Pacing Pulse Width: 0.4 ms
Lead Channel Setting Sensing Sensitivity: 0.3 mV

## 2021-05-04 NOTE — Addendum Note (Signed)
Addended by: Geralyn Flash D on: 05/04/2021 09:56 PM   Modules accepted: Level of Service

## 2021-05-04 NOTE — Progress Notes (Signed)
Remote pacemaker transmission.   

## 2021-05-11 ENCOUNTER — Telehealth: Payer: Self-pay | Admitting: Internal Medicine

## 2021-05-11 NOTE — Telephone Encounter (Signed)
  1. Has your device fired? Yes. It went off about 40 min ago  2. Is you device beeping? He said it sounds like an ambulance siren  3. Are you experiencing draining or swelling at device site?   4. Are you calling to see if we received your device transmission?   5. Have you passed out? No.  Patient went to work and feels fine. He was just alarmed when it went off this morning     Please route to Device Clinic Pool

## 2021-05-11 NOTE — Telephone Encounter (Signed)
Patient called back to let us know he is at work until 3 pm and he will call when he gets home so we can assist him with sending a transmission

## 2021-05-12 ENCOUNTER — Other Ambulatory Visit: Payer: Self-pay

## 2021-05-12 ENCOUNTER — Ambulatory Visit (INDEPENDENT_AMBULATORY_CARE_PROVIDER_SITE_OTHER): Payer: Self-pay

## 2021-05-12 DIAGNOSIS — I255 Ischemic cardiomyopathy: Secondary | ICD-10-CM

## 2021-05-12 NOTE — Telephone Encounter (Signed)
Care alert, device has reached RRT 9/1 7 NSVT episodes, 6-10 beats Route to triage LR  Successful telephone encounter to patient to inform that device has reached RRT. Patient has follow up appointment with Dr. Johney Frame 06/12/21. He is encouraged to keep appointment as scheduled. Patient states the daily RRT alert from his device is alarming and request deactivation. Device clinic appointment made for today, 05/12/21 at 4:00 to turn off device RRT audible alert. Patient appreciative of call.

## 2021-05-17 NOTE — Progress Notes (Signed)
Patient presented to clinic today to have Low Battery Voltage alerts turned off per protocol. Appt to discuss gen change 06/12/21. Full check not completed.

## 2021-05-29 ENCOUNTER — Other Ambulatory Visit: Payer: Self-pay | Admitting: Cardiology

## 2021-06-01 ENCOUNTER — Encounter (HOSPITAL_COMMUNITY): Payer: Self-pay

## 2021-06-01 ENCOUNTER — Ambulatory Visit (HOSPITAL_COMMUNITY)
Admission: EM | Admit: 2021-06-01 | Discharge: 2021-06-01 | Disposition: A | Discharge status: Home / Self Care | Payer: Self-pay | Attending: Physician Assistant | Admitting: Physician Assistant

## 2021-06-01 ENCOUNTER — Other Ambulatory Visit: Payer: Self-pay

## 2021-06-01 DIAGNOSIS — N3090 Cystitis, unspecified without hematuria: Secondary | ICD-10-CM | POA: Insufficient documentation

## 2021-06-01 LAB — POCT URINALYSIS DIPSTICK, ED / UC
Bilirubin Urine: NEGATIVE
Glucose, UA: NEGATIVE mg/dL
Ketones, ur: NEGATIVE mg/dL
Nitrite: POSITIVE — AB
Protein, ur: 30 mg/dL — AB
Specific Gravity, Urine: 1.02 (ref 1.005–1.030)
Urobilinogen, UA: 1 mg/dL (ref 0.0–1.0)
pH: 6 (ref 5.0–8.0)

## 2021-06-01 MED ORDER — CIPROFLOXACIN HCL 500 MG PO TABS: 500.0000 mg | ORAL_TABLET | Freq: Two times a day (BID) | ORAL | 0 refills | Status: DC

## 2021-06-01 NOTE — ED Provider Notes (Signed)
[facility]    CSN: [id number] Arrival date & time: 06/01/21  1613      History   Chief Complaint Chief Complaint  Patient presents with   Urinary Frequency   Dysuria    HPI [name] is a 80 y.o. male.   Patient here today for evaluation of dysuria, urinary frequency that started earlier today.  He reports that he thinks this is related to drinking a pure of ginger root and sugar.  He states he has drank this before when he has had a cold.  He denies any fever or chills.  He has not had any nausea or vomiting.  He denies any back pain associated with symptoms.  He does not report any treatment for symptoms.   Urinary Frequency Pertinent negatives include no shortness of breath.  Dysuria Presenting symptoms: dysuria   Associated symptoms: urinary frequency   Associated symptoms: no fever, no nausea and no vomiting    Past Medical History:  Diagnosis Date   Acute renal failure (HCC) 05/31/2014   Angina decubitus (HCC) 06/15/2014   CAD (coronary artery disease)    a. s/p stent RCA DES 9/09; b. 2014 Attempted PCI of OM1 @ [place];  c. 04/2014 Cath: LAD 40-50p, D1 95-99 (chronic), LCX 30-40 inf branch, OM1 CTO, RCA 30-40p, RCA patent stent, EF 35%->Med Rx.   Chronic combined systolic and diastolic CHF (congestive heart failure) (HCC)    a. EF about 40 to 45% per echo in April 2013;  b. 04/2014 Echo: EF 20-25%, sev LVH, sev glob HK, Gr 1 DD, mildly reduced RV fxn, PASP . c. 01/2017: EF improved to 50-55%.    Chronic low back pain    COPD (chronic obstructive pulmonary disease) (HCC) dx 06/2013   PFTs 07/08/13: mod obst with resp to bronchodilator, moderate decrease diffusion, airtrapping   COPD with acute exacerbation (HCC) 01/17/2017   COPD with asthma (HCC)    07/08/13 PFT: FEV1 1.74L (66% pred, 30% change with BD), mod obst with resp to bronchodilator, moderate decrease diffusion, air-trapping 11/2013 Simple spiro>> clear obstruction, FEV1 1.30 L (47%  pred) - trial of symbicort 160 2bid 01/26/15      Depression 09/15/2018   Assessment: Increased sadness and depression since losing job  Plan: Patient denies suicidal and homicidal ideations Patient would not like to start medication therapies at this time Will establish patient with [facility] for primary care   Dyslipidemia    a. on statin   Essential hypertension    HLD (hyperlipidemia)    HTN (hypertension)    a. Reports intolerance to hydralazine; b. no beta blockers 2/2 bradycardia;  c. failed on ACE and ARB.   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 04/2014 Echo: EF 20-25%, sev glob HK.   Mixed Ischemic/Non-Ischemic Cardiomyopathy    probable mixed ischemic and non-ischemic    Noncompliance    NSVT (nonsustained ventricular tachycardia) (HCC)    OSA (obstructive sleep apnea) 04/18/2018   06/11/16 - home sleep study shows AHI of 2.9 an hour with the lowest SaO2 of 79% with an average of 93%  03/08/2018-Home sleep study-AHI 7/HR, SaO2 low 81%    Paroxysmal atrial fibrillation (HCC)    a. identified on device interrogation 01/2016   Pneumonia 09/02/2013   Pulmonary edema 05/10/2014   Respiratory arrest (HCC)  05/18/2014   Sinus bradycardia 05/31/2014   Syncope 05/30/2014    Patient Active Problem List   Diagnosis Date Noted   Depression 09/15/2018   OSA (obstructive sleep apnea) 04/18/2018   Healthcare maintenance 04/18/2018   COPD with acute exacerbation (HCC) 01/17/2017   Allergic rhinitis 01/17/2017   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   Nocturnal hypoxemia 07/03/2016   Facial abscess 02/04/2016   Dental abscess    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/12/2014   Angina decubitus (HCC) 06/15/2014   Acute renal failure (HCC) 05/31/2014   Sinus bradycardia 05/31/2014   Syncope 05/30/2014   Chronic combined systolic and  diastolic CHF (congestive heart failure) (HCC)    Mixed Ischemic/Non-Ischemic Cardiomyopathy    NSVT (nonsustained ventricular tachycardia) (HCC)    HLD (hyperlipidemia)    Respiratory arrest (HCC) 05/18/2014   CAD (coronary artery disease) 05/18/2014   Pulmonary edema 05/10/2014   HTN (hypertension) 05/08/2014   LBBB (left bundle branch block) 05/08/2014   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   Low back pain 10/05/2013   NSVT (nonsustained ventricular tachycardia) (HCC) 09/04/2013   Pneumonia 09/02/2013   Flu-like symptoms 09/02/2013   Anemia 09/02/2013   COPD with asthma (HCC)    Fatigue 06/18/2013   Nocturia 06/18/2013   Dyspnea 06/18/2013   LV dysfunction 12/13/2011   CAD (coronary artery disease) 10/10/2011   Tobacco abuse 10/10/2011   Dyslipidemia    Essential hypertension    Noncompliance     Past Surgical History:  Procedure Laterality Date   BI-VENTRICULAR IMPLANTABLE CARDIOVERTER DEFIBRILLATOR N/A 08/12/2014   MDT Ovidio Kin XT CRTD implanted by Dr Johney Frame   CARDIAC CATHETERIZATION     ejection fraction 50%   LEFT HEART CATHETERIZATION WITH CORONARY ANGIOGRAM N/A 05/08/2014   Procedure: LEFT HEART CATHETERIZATION WITH CORONARY ANGIOGRAM;  Surgeon: Micheline Chapman, MD;  Location: [facility] CATH LAB;  Service: Cardiovascular;  Laterality: N/A;       Home Medications    Prior to Admission medications   Medication Sig Start Date End Date Taking? Authorizing Provider  ciprofloxacin (CIPRO) 500 MG tablet Take 1 tablet (500 mg total) by mouth every 12 (twelve) hours. 06/01/21  Yes Tomi Bamberger, PA-C  albuterol (PROVENTIL) (2.5 MG/3ML) 0.083% nebulizer solution Take 3 mLs (2.5 mg total) by nebulization every 6 (six) hours as needed for wheezing or shortness of breath. 02/11/18   Coral Ceo, NP  albuterol (VENTOLIN HFA) 108 (90 Base) MCG/ACT inhaler Inhale 1 puff into the lungs every 6 (six) hours as needed for wheezing or shortness of breath. 02/07/21   Sheilah Pigeon, PA-C   amLODipine (NORVASC) 5 MG tablet TAKE 1 TABLET (5 MG TOTAL) BY MOUTH DAILY. PLEASE KEEP UPCOMING APPT IN MAY 2022 BEFORE ANYMORE REFILLS. THANK YOU 05/29/21   [place], Peter M, MD  atorvastatin (LIPITOR) 20 MG tablet Take 1 tablet (20 mg total) by mouth daily. 02/28/21   [place], Peter M, MD  budesonide-formoterol [facility]) 160-4.5 MCG/ACT inhaler TAKE 2 PUFFS BY MOUTH TWICE A DAY 02/28/21   [place], Peter M, MD  clopidogrel (PLAVIX) 75 MG tablet TAKE 1 TABLET DAILY. PLEASE KEEP APPOINTMENT FOR MORE REFILLS. 02/10/21   Hillis Range, MD  isosorbide mononitrate (IMDUR) 60 MG 24 hr tablet Take 1 tablet (60 mg total) by mouth daily. 01/11/20   [place], Peter M, MD  losartan (COZAAR) 100 MG tablet Take 1 tablet (100 mg total) by mouth daily. 01/11/20   [place], Peter M, MD  metoprolol tartrate (LOPRESSOR) 100 MG tablet Take 1 tablet (100 mg total)  by mouth 2 (two) times daily. 02/07/21   Sheilah Pigeon, PA-C  nitroGLYCERIN (NITROSTAT) 0.4 MG SL tablet Place 1 tablet (0.4 mg total) under the tongue every 5 (five) minutes as needed for chest pain. 02/07/21 06/28/23  Sheilah Pigeon, PA-C  potassium chloride SA (K-DUR,KLOR-CON) 20 MEQ tablet Take 40 mEq by mouth daily. Patient not taking: Reported on 06/01/2021    [provider]  traMADol (ULTRAM) 50 MG tablet Take 1 tablet (50 mg total) by mouth every 6 (six) hours as needed. Patient not taking: Reported on 06/01/2021 12/14/17   Rise Mu, PA-C    Family History Family History  Problem Relation Age of Onset   Heart attack Neg Hx    Stroke Neg Hx     Social History Social History   Tobacco Use   Smoking status: Former    Packs/day: 0.50    Years: 56.00    Pack years: 28.00    Types: Cigarettes    Quit date: 09/10/2012    Years since quitting: 8.7   Smokeless tobacco: Never   Tobacco comments:    tried 1 month ago (05/2018) but didn't like it   Vaping Use   Vaping Use: Never used  Substance Use Topics   Alcohol use: No   Drug  use: No     Allergies   Acyclovir and related and Aspirin   Review of Systems Review of Systems  Constitutional:  Negative for chills and fever.  Respiratory:  Negative for shortness of breath.   Gastrointestinal:  Negative for abdominal distention, nausea and vomiting.  Genitourinary:  Positive for dysuria and frequency.    Physical Exam Triage Vital Signs ED Triage Vitals  Enc Vitals Group     BP 06/01/21 1719 (!) 193/83     Pulse Rate 06/01/21 1719 69     Resp 06/01/21 1719 14     Temp 06/01/21 1719 99 F (37.2 C)     Temp Source 06/01/21 1719 Oral     SpO2 06/01/21 1719 100 %     Weight --      Height --      Head Circumference --      Peak Flow --      Pain Score 06/01/21 1722 10     Pain Loc --      Pain Edu? --      Excl. in GC? --    No data found.  Updated Vital Signs BP (!) 193/83 (BP Location: Right Arm)   Pulse 69   Temp 99 F (37.2 C) (Oral)   Resp 14   SpO2 100%     Physical Exam Vitals and nursing note reviewed.  Constitutional:      General: He is not in acute distress.    Appearance: Normal appearance. He is not ill-appearing.  HENT:     Head: Normocephalic and atraumatic.  Eyes:     Conjunctiva/sclera: Conjunctivae normal.  Cardiovascular:     Rate and Rhythm: Normal rate and regular rhythm.     Heart sounds: Normal heart sounds. No murmur heard. Pulmonary:     Effort: Pulmonary effort is normal. No respiratory distress.     Breath sounds: Normal breath sounds. No wheezing, rhonchi or rales.  Abdominal:     General: Abdomen is flat. Bowel sounds are normal. There is no distension.     Palpations: Abdomen is soft.     Tenderness: There is no abdominal tenderness. There is no right CVA tenderness,  left CVA tenderness, guarding or rebound.  Skin:    General: Skin is warm and dry.  Neurological:     Mental Status: He is alert.  Psychiatric:        Mood and Affect: Mood normal.        Behavior: Behavior normal.        Thought  Content: Thought content normal.     UC Treatments / Results  Labs (all labs ordered are listed, but only abnormal results are displayed) Labs Reviewed  POCT URINALYSIS DIPSTICK, ED / UC - Abnormal; Notable for the following components:      Result Value   Hgb urine dipstick LARGE (*)    Protein, ur 30 (*)    Nitrite POSITIVE (*)    Leukocytes,Ua LARGE (*)    All other components within normal limits  URINE CULTURE    EKG   Radiology No results found.  Procedures Procedures (including critical care time)  Medications Ordered in UC Medications - No data to display  Initial Impression / Assessment and Plan / UC Course  I have reviewed the triage vital signs and the nursing notes.  Pertinent labs & imaging results that were available during my care of the patient were reviewed by me and considered in my medical decision making (see chart for details).  UA with large amount of leukocyte Estrace, as well as positive nitrites.  Will treat to cover UTI and urine culture ordered.  Encouraged follow-up with any worsening symptoms or if symptoms fail to improve.  Final Clinical Impressions(s) / UC Diagnoses   Final diagnoses:  Cystitis     Discharge Instructions      Take antibiotic as prescribed.  Follow-up if symptoms fail to improve or worsen anyway.     ED Prescriptions     Medication Sig Dispense Auth. Provider   ciprofloxacin (CIPRO) 500 MG tablet Take 1 tablet (500 mg total) by mouth every 12 (twelve) hours. 10 tablet Tomi Bamberger, PA-C      PDMP not reviewed this encounter.   Tomi Bamberger, PA-C 06/01/21 [phone]

## 2021-06-01 NOTE — Discharge Instructions (Addendum)
Take antibiotic as prescribed.  Follow-up if symptoms fail to improve or worsen anyway.

## 2021-06-01 NOTE — ED Triage Notes (Signed)
Pt presents with dysuria and urinary frequency after drinking ground up ginger root.

## 2021-06-01 NOTE — ED Triage Notes (Signed)
Pt has taken OTC tylenol for his pain

## 2021-06-04 LAB — URINE CULTURE: Culture: >=100000 — AB

## 2021-06-05 ENCOUNTER — Encounter: Payer: Self-pay | Admitting: Internal Medicine

## 2021-06-12 ENCOUNTER — Other Ambulatory Visit: Payer: Self-pay

## 2021-06-12 ENCOUNTER — Encounter: Payer: Self-pay | Admitting: *Deleted

## 2021-06-12 ENCOUNTER — Ambulatory Visit (INDEPENDENT_AMBULATORY_CARE_PROVIDER_SITE_OTHER): Payer: Self-pay | Admitting: Internal Medicine

## 2021-06-12 ENCOUNTER — Encounter: Payer: Self-pay | Admitting: Internal Medicine

## 2021-06-12 VITALS — BP 152/90 | HR 81 | Ht 68.5 in | Wt 149.6 lb

## 2021-06-12 DIAGNOSIS — I5022 Chronic systolic (congestive) heart failure: Secondary | ICD-10-CM

## 2021-06-12 DIAGNOSIS — I480 Paroxysmal atrial fibrillation: Secondary | ICD-10-CM

## 2021-06-12 DIAGNOSIS — I472 Ventricular tachycardia, unspecified: Secondary | ICD-10-CM

## 2021-06-12 DIAGNOSIS — J4489 Other specified chronic obstructive pulmonary disease: Secondary | ICD-10-CM

## 2021-06-12 DIAGNOSIS — I255 Ischemic cardiomyopathy: Secondary | ICD-10-CM

## 2021-06-12 DIAGNOSIS — I10 Essential (primary) hypertension: Secondary | ICD-10-CM

## 2021-06-12 DIAGNOSIS — J449 Chronic obstructive pulmonary disease, unspecified: Secondary | ICD-10-CM

## 2021-06-12 NOTE — Patient Instructions (Addendum)
Medication Instructions:  Your physician recommends that you continue on your current medications as directed. Please refer to the Current Medication list given to you today. *If you need a refill on your cardiac medications before your next appointment, please call your pharmacy*  Lab Work: None. If you have labs (blood work) drawn today and your tests are completely normal, you will receive your results only by: MyChart Message (if you have MyChart) OR A paper copy in the mail If you have any lab test that is abnormal or we need to change your treatment, we will call you to review the results.  Testing/Procedures: Your physician has recommended that you have a defibrillator inserted. An implantable cardioverter defibrillator (ICD) is a small device that is placed in your chest or, in rare cases, your abdomen. This device uses electrical pulses or shocks to help control life-threatening, irregular heartbeats that could lead the heart to suddenly stop beating (sudden cardiac arrest). Leads are attached to the ICD that goes into your heart. This is done in the hospital and usually requires an overnight stay. Please see the instruction sheet given to you today for more information.   Follow-Up: At [facility], you and your health needs are our priority.  As part of our continuing mission to provide you with exceptional heart care, we have created designated Provider Care Teams.  These Care Teams include your primary Cardiologist (physician) and Advanced Practice Providers (APPs -  Physician Assistants and Nurse Practitioners) who all work together to provide you with the care you need, when you need it.  Remote monitoring is used to monitor your ICD from home. This monitoring reduces the number of office visits required to check your device to one time per year. It allows [place] to keep an eye on the functioning of your device to ensure it is working properly. You are scheduled for a device check from  home on 06/15/21. You may send your transmission at any time that day. If you have a wireless device, the transmission will be sent automatically. After your physician reviews your transmission, you will receive a postcard with your next transmission date.  We recommend signing up for the patient portal called "MyChart".  Sign up information is provided on this After Visit Summary.  MyChart is used to connect with patients for Virtual Visits (Telemedicine).  Patients are able to view lab/test results, encounter notes, upcoming appointments, etc.  Non-urgent messages can be sent to your provider as well.   To learn more about what you can do with MyChart, go to [email or web address].    Any Other Special Instructions Will Be Listed Below (If Applicable).  Cardioverter Defibrillator Implantation An implantable cardioverter defibrillator (ICD) is a device that identifies and corrects abnormal heart rhythms. Cardioverter defibrillator implantation is a surgery to place an ICD under the skin in the chest or abdomen. An ICD has a battery, a small computer (pulse generator), and wires (leads) that go into the heart. The ICD detects and corrects two types of dangerous irregular heart rhythms (arrhythmias): A rapid heart rhythm in the lower chambers of the heart (ventricles). This is called ventricular tachycardia. The ventricles contracting in an uncoordinated way. This is called ventricular fibrillation. There are different types of ICDs, and the electrical signals from the ICD can be programmed differently based on the condition being treated. The electrical signals from the ICD can be low-energy pulses, high-energy shocks, or a combination of the two. The low-energy pulses are generally used to restore  the heartbeat to normal when it is either too slow (bradycardia) or too fast. These pulses are painless. The high-energy shocks are used to treat abnormal rhythms such as ventricular tachycardia or ventricular  fibrillation. This shock may feel like a strong jolt in the chest. Your health care provider may recommend an ICD if you have: Had a ventricular arrhythmia in the past. A damaged heart because of a disease or heart condition. A weakened heart muscle from a heart attack or cardiac arrest. A congenital heart defect. Long QT syndrome, which is a disorder of the heart's electrical system. Brugada syndrome, which is a condition that causes a disruption of the heart's normal rhythm. Tell a health care provider about: Any allergies you have. All medicines you are taking, including vitamins, herbs, eye drops, creams, and over-the-counter medicines. Any problems you or family members have had with anesthetic medicines. Any blood disorders you have. Any surgeries you have had. Any medical conditions you have. Whether you are pregnant or may be pregnant. What are the risks? Generally, this is a safe procedure. However, problems may occur, including: Infection. Bleeding. Allergic reactions to medicines used during the procedure. Blood clots. Swelling or bruising. Damage to nearby structures or organs, such as nerves, lungs, blood vessels, or the heart where the ICD leads or pulse generator is implanted. What happens before the procedure? Staying hydrated Follow instructions from your health care provider about hydration, which may include: Up to 2 hours before the procedure - you may continue to drink clear liquids, such as water, clear fruit juice, black coffee, and plain tea.  Eating and drinking restrictions Follow instructions from your health care provider about eating and drinking, which may include: 8 hours before the procedure - stop eating heavy meals or foods, such as meat, fried foods, or fatty foods. 6 hours before the procedure - stop eating light meals or foods, such as toast or cereal. 6 hours before the procedure - stop drinking milk or drinks that contain milk. 2 hours before  the procedure - stop drinking clear liquids. Medicines Ask your health care provider about: Changing or stopping your regular medicines. This is especially important if you are taking diabetes medicines or blood thinners. Taking medicines such as aspirin and ibuprofen. These medicines can thin your blood. Do not take these medicines unless your health care provider tells you to take them. Taking over-the-counter medicines, vitamins, herbs, and supplements. Tests You may have an exam or testing. These may include: Blood tests. A test to check the electrical signals in your heart (electrocardiogram, ECG). Imaging tests, such as a chest X-ray. Echocardiogram. This is an ultrasound of your heart to evaluate your heart structures and function. An event monitor or Holter monitor to wear at home. General instructions Do not use any products that contain nicotine or tobacco for at least 4 weeks before the procedure. These products include cigarettes, chewing tobacco, and vaping devices, such as e-cigarettes. If you need help quitting, ask your health care provider. Ask your health care provider: How your procedure site will be marked. What steps will be taken to help prevent infection. These may include: Removing hair at the surgery site. Washing skin with a germ-killing soap. Taking antibiotic medicine. You may be asked to shower with a germ-killing soap. Plan to have a responsible adult take you home from the hospital or clinic. What happens during the procedure?  Small monitors will be put on your body. They will be used to check your heart rate,  blood pressure, and oxygen level. A pair of sticky pads (defibrillator pads) may be placed on your back and chest. These pads are able to pace your heart as needed during the procedure. An IV will be inserted into one of your veins. You will be given one or more of the following: A medicine to help you relax (sedative). A medicine to numb the area  (local anesthetic). A medicine to make you fall asleep(general anesthetic). A small incision will be made to create a deep pocket under the skin of your chest or abdomen. Leads will be guided through a blood vessel into your heart and attached to your heart muscles. Depending on the ICD, the leads may go into one ventricle, or they may go into both ventricles and into an upper chamber of the heart. An X-ray machine (fluoroscope) will be used to help guide the leads. The other end of the leads will be attached to the pulse generator. The pulse generator will be placed into the pocket under the skin. The ICD will be tested, and your health care provider will program the ICD for the condition being treated. The incision will be closed with stitches (sutures), skin glue, adhesive strips, or staples. A bandage (dressing) will be placed over the incision. The procedure may vary among health care providers and hospitals. What happens after the procedure? Your blood pressure, heart rate, breathing rate, and blood oxygen level will be monitored until you leave the hospital or clinic. Your health care provider will also monitor your ICD to make sure it is working properly. A chest X-ray will be taken to check that the ICD is in the right place. Do not raise the arm on the side of your procedure higher than your shoulder for as long as told by your health care provider. This is usually at least 6 weeks. You may be given an identification card explaining that you have an ICD. You will be given a remote home monitoring device to use with your ICD to allow your device to communicate with your clinic. Summary An implantable cardioverter defibrillator (ICD) is a device that identifies and corrects abnormal heart rhythms. Cardioverter defibrillator implantation is a surgery to place an ICD under the skin in the chest or abdomen. An ICD consists of a battery, a small computer (pulse generator), and wires (leads) that  go into the heart. During the procedure, the ICD will be tested, and your health care provider will program the ICD for the condition being treated. After the procedure, a chest X-ray will be taken to check that the ICD is in the right place. This information is not intended to replace advice given to you by your health care provider. Make sure you discuss any questions you have with your health care provider. Document Revised: 02/24/2020 Document Reviewed: 02/24/2020 Elsevier Patient Education  2022 [employer].

## 2021-06-12 NOTE — Progress Notes (Signed)
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  Since last being seen in our clinic, the patient reports doing very well.  Today, he denies symptoms of palpitations, chest pain, shortness of breath,  lower extremity edema, dizziness, presyncope, syncope, or ICD shocks.  The patient is otherwise without complaint today.   Past Medical History:  Diagnosis Date   Acute renal failure (HCC) 05/31/2014   Angina decubitus (HCC) 06/15/2014   CAD (coronary artery disease)    a. s/p stent RCA DES 9/09; b. 2014 Attempted PCI of OM1 @ [place];  c. 04/2014 Cath: LAD 40-50p, D1 95-99 (chronic), LCX 30-40 inf branch, OM1 CTO, RCA 30-40p, RCA patent stent, EF 35%->Med Rx.   Chronic combined systolic and diastolic CHF (congestive heart failure) (HCC)    a. EF about 40 to 45% per echo in April 2013;  b. 04/2014 Echo: EF 20-25%, sev LVH, sev glob HK, Gr 1 DD, mildly reduced RV fxn, PASP . c. 01/2017: EF improved to 50-55%.    Chronic low back pain    COPD (chronic obstructive pulmonary disease) (HCC) dx 06/2013   PFTs 07/08/13: mod obst with resp to bronchodilator, moderate decrease diffusion, airtrapping   COPD with acute exacerbation (HCC) 01/17/2017   COPD with asthma (HCC)    07/08/13 PFT: FEV1 1.74L (66% pred, 30% change with BD), mod obst with resp to bronchodilator, moderate decrease diffusion, air-trapping 11/2013 Simple spiro>> clear obstruction, FEV1 1.30 L (47% pred) - trial of symbicort 160 2bid 01/26/15      Depression 09/15/2018   Assessment: Increased sadness and depression since losing job  Plan: Patient denies suicidal and homicidal ideations Patient would not like to start medication therapies at this time Will establish patient with community health and wellness for primary care   Dyslipidemia    a. on statin   Essential hypertension    HLD (hyperlipidemia)    HTN (hypertension)    a. Reports intolerance to  hydralazine; b. no beta blockers 2/2 bradycardia;  c. failed on ACE and ARB.   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 04/2014 Echo: EF 20-25%, sev glob HK.   Mixed Ischemic/Non-Ischemic Cardiomyopathy    probable mixed ischemic and non-ischemic    Noncompliance    NSVT (nonsustained ventricular tachycardia)    OSA (obstructive sleep apnea) 04/18/2018   06/11/16 - home sleep study shows AHI of 2.9 an hour with the lowest SaO2 of 79% with an average of 93%  03/08/2018-Home sleep study-AHI 7/HR, SaO2 low 81%    Paroxysmal atrial fibrillation (HCC)    a. identified on device interrogation 01/2016   Pneumonia 09/02/2013   Pulmonary edema 05/10/2014   Respiratory arrest (HCC) 05/18/2014   Sinus bradycardia 05/31/2014   Syncope 05/30/2014   Past Surgical History:  Procedure Laterality Date   BI-VENTRICULAR IMPLANTABLE CARDIOVERTER DEFIBRILLATOR N/A 08/12/2014   MDT Ovidio Kin XT CRTD implanted by Dr Johney Frame   CARDIAC CATHETERIZATION     ejection fraction 50%   LEFT HEART CATHETERIZATION WITH CORONARY ANGIOGRAM N/A 05/08/2014   Procedure: LEFT HEART CATHETERIZATION WITH CORONARY ANGIOGRAM;  Surgeon: Micheline Chapman, MD;  Location: [facility] CATH LAB;  Service: Cardiovascular;  Laterality: N/A;    ROS- all systems are reviewed and negative except as per HPI  above  Current Outpatient Medications  Medication Sig Dispense Refill   albuterol (PROVENTIL) (2.5 MG/3ML) 0.083% nebulizer solution Take 3 mLs (2.5 mg total) by nebulization every 6 (six) hours as needed for wheezing or shortness of breath. 360 mL 11   albuterol (VENTOLIN HFA) 108 (90 Base) MCG/ACT inhaler Inhale 1 puff into the lungs every 6 (six) hours as needed for wheezing or shortness of breath. 1 each 0   amLODipine (NORVASC) 5 MG tablet TAKE 1 TABLET (5 MG TOTAL) BY MOUTH DAILY. PLEASE KEEP UPCOMING  APPT IN MAY 2022 BEFORE ANYMORE REFILLS. THANK YOU 90 tablet 3   atorvastatin (LIPITOR) 20 MG tablet Take 1 tablet (20 mg total) by mouth daily. 90 tablet 3   budesonide-formoterol (SYMBICORT) 160-4.5 MCG/ACT inhaler TAKE 2 PUFFS BY MOUTH TWICE A DAY 10.2 each 3   ciprofloxacin (CIPRO) 500 MG tablet Take 1 tablet (500 mg total) by mouth every 12 (twelve) hours. 10 tablet 0   clopidogrel (PLAVIX) 75 MG tablet TAKE 1 TABLET DAILY. PLEASE KEEP APPOINTMENT FOR MORE REFILLS. 90 tablet 3   isosorbide mononitrate (IMDUR) 60 MG 24 hr tablet Take 1 tablet (60 mg total) by mouth daily. 90 tablet 2   losartan (COZAAR) 100 MG tablet Take 1 tablet (100 mg total) by mouth daily. 90 tablet 3   metoprolol tartrate (LOPRESSOR) 100 MG tablet Take 1 tablet (100 mg total) by mouth 2 (two) times daily. 180 tablet 1   nitroGLYCERIN (NITROSTAT) 0.4 MG SL tablet Place 1 tablet (0.4 mg total) under the tongue every 5 (five) minutes as needed for chest pain. 25 tablet 3   potassium chloride SA (K-DUR,KLOR-CON) 20 MEQ tablet Take 40 mEq by mouth daily.     traMADol (ULTRAM) 50 MG tablet Take 1 tablet (50 mg total) by mouth every 6 (six) hours as needed. 10 tablet 0   No current facility-administered medications for this visit.    Physical Exam: Vitals:   06/12/21 1554  BP: (!) 152/90  Pulse: 81  SpO2: 95%  Weight: 149 lb 9.6 oz (67.9 kg)  Height: 5' 8.5" (1.74 m)    GEN- The patient is well appearing, alert and oriented x 3 today.   Head- normocephalic, atraumatic Eyes-  Sclera clear, conjunctiva pink Ears- hearing intact Oropharynx- clear Lungs- Clear to ausculation bilaterally, normal work of breathing Chest- ICD pocket is well healed Heart- Regular rate and rhythm, no murmurs, rubs or gallops, PMI not laterally displaced GI- soft, NT, ND, + BS Extremities- no clubbing, cyanosis, or edema  ICD remotes are reviewed.  He is now at [employer] tracing ordered today is personally reviewed and shows sinus with  BiV pacing  Wt Readings from Last 3 Encounters:  06/12/21 149 lb 9.6 oz (67.9 kg)  02/28/21 152 lb 3.2 oz (69 kg)  02/07/21 149 lb (67.6 kg)    Assessment and Plan:  1.  Chronic systolic dysfunction/ ischemic CM euvolemic today EF has improved to 55% with CRT! VT is well controlled Stable on an appropriate medical regimen Normal BiV ICD function but has reached RRT  See Pace Art report No changes today he is not device dependant today Risks, benefits, and alternatives to BiV ICD pulse generator replacement were discussed in detail today.  The patient understands that risks include but are not limited to bleeding, infection, pneumothorax, perforation, tamponade, vascular damage, renal failure, MI, stroke, death, inappropriate shocks, damage to his existing leads, and lead dislodgement and wishes to proceed.  We will therefore  schedule the procedure at the next available time.  He wishes to wait until later November so that he can get his medicare problems addressed  2. HTN Stable No change required today  3. Paroxysmal atrial fibrillation AF burden is <0.1% Given low burden we have not started [facility] We will consider if AF burden increases (Chads2vasc score is 5)  Risks, benefits and potential toxicities for medications prescribed and/or refilled reviewed with patient today.   Hillis Range MD, [facility] 06/12/2021 4:14 PM

## 2021-06-15 ENCOUNTER — Ambulatory Visit (INDEPENDENT_AMBULATORY_CARE_PROVIDER_SITE_OTHER): Payer: Self-pay

## 2021-06-15 DIAGNOSIS — I429 Cardiomyopathy, unspecified: Secondary | ICD-10-CM

## 2021-06-19 LAB — CUP PACEART REMOTE DEVICE CHECK
Battery Remaining Longevity: 1 mo — CL
Battery Voltage: 2.63 V
Brady Statistic AP VP Percent: 71.21 %
Brady Statistic AP VS Percent: 0.07 %
Brady Statistic AS VP Percent: 28.24 %
Brady Statistic AS VS Percent: 0.48 %
Brady Statistic RA Percent Paced: 69.11 %
Brady Statistic RV Percent Paced: 98.22 %
Date Time Interrogation Session: 20221006213946
HighPow Impedance: 73 Ohm
Implantable Lead Implant Date: 20151203
Implantable Lead Implant Date: 20151203
Implantable Lead Implant Date: 20151203
Implantable Lead Location: 753858
Implantable Lead Location: 753859
Implantable Lead Location: 753860
Implantable Lead Model: 4598
Implantable Lead Model: 5076
Implantable Lead Model: 6935
Implantable Pulse Generator Implant Date: 20151203
Lead Channel Impedance Value: 304 Ohm
Lead Channel Impedance Value: 323 Ohm
Lead Channel Impedance Value: 342 Ohm
Lead Channel Impedance Value: 342 Ohm
Lead Channel Impedance Value: 342 Ohm
Lead Channel Impedance Value: 342 Ohm
Lead Channel Impedance Value: 399 Ohm
Lead Channel Impedance Value: 399 Ohm
Lead Channel Impedance Value: 532 Ohm
Lead Channel Impedance Value: 570 Ohm
Lead Channel Impedance Value: 589 Ohm
Lead Channel Impedance Value: 589 Ohm
Lead Channel Impedance Value: 589 Ohm
Lead Channel Pacing Threshold Amplitude: 0.625 V
Lead Channel Pacing Threshold Amplitude: 1 V
Lead Channel Pacing Threshold Amplitude: 1.25 V
Lead Channel Pacing Threshold Pulse Width: 0.4 ms
Lead Channel Pacing Threshold Pulse Width: 0.4 ms
Lead Channel Pacing Threshold Pulse Width: 0.4 ms
Lead Channel Sensing Intrinsic Amplitude: 0.875 mV
Lead Channel Sensing Intrinsic Amplitude: 0.875 mV
Lead Channel Sensing Intrinsic Amplitude: 24.5 mV
Lead Channel Sensing Intrinsic Amplitude: 24.5 mV
Lead Channel Setting Pacing Amplitude: 1.5 V
Lead Channel Setting Pacing Amplitude: 2 V
Lead Channel Setting Pacing Amplitude: 3 V
Lead Channel Setting Pacing Pulse Width: 0.4 ms
Lead Channel Setting Pacing Pulse Width: 0.4 ms
Lead Channel Setting Sensing Sensitivity: 0.3 mV

## 2021-06-26 NOTE — Addendum Note (Signed)
Addended by: Elease Etienne A on: 06/26/2021 09:26 AM   Modules accepted: Level of Service

## 2021-06-26 NOTE — Progress Notes (Signed)
Remote ICD transmission.   

## 2021-07-21 ENCOUNTER — Other Ambulatory Visit: Payer: Self-pay

## 2021-07-21 DIAGNOSIS — I480 Paroxysmal atrial fibrillation: Secondary | ICD-10-CM

## 2021-07-21 DIAGNOSIS — J449 Chronic obstructive pulmonary disease, unspecified: Secondary | ICD-10-CM

## 2021-07-21 DIAGNOSIS — I10 Essential (primary) hypertension: Secondary | ICD-10-CM

## 2021-07-21 DIAGNOSIS — J4489 Other specified chronic obstructive pulmonary disease: Secondary | ICD-10-CM

## 2021-07-21 DIAGNOSIS — I472 Ventricular tachycardia, unspecified: Secondary | ICD-10-CM

## 2021-07-21 DIAGNOSIS — I5022 Chronic systolic (congestive) heart failure: Secondary | ICD-10-CM

## 2021-07-21 DIAGNOSIS — I255 Ischemic cardiomyopathy: Secondary | ICD-10-CM

## 2021-07-22 LAB — CBC WITH DIFFERENTIAL/PLATELET
Basophils Absolute: 0 10*3/uL (ref 0.0–0.2)
Basos: 0 %
EOS (ABSOLUTE): 0.1 10*3/uL (ref 0.0–0.4)
Eos: 2 %
Hematocrit: 37.4 % — ABNORMAL LOW (ref 37.5–51.0)
Hemoglobin: 12 g/dL — ABNORMAL LOW (ref 13.0–17.7)
Immature Grans (Abs): 0 10*3/uL (ref 0.0–0.1)
Immature Granulocytes: 0 %
Lymphocytes Absolute: 3.4 10*3/uL — ABNORMAL HIGH (ref 0.7–3.1)
Lymphs: 53 %
MCH: 27.8 pg (ref 26.6–33.0)
MCHC: 32.1 g/dL (ref 31.5–35.7)
MCV: 87 fL (ref 79–97)
Monocytes Absolute: 0.7 10*3/uL (ref 0.1–0.9)
Monocytes: 11 %
Neutrophils Absolute: 2.2 10*3/uL (ref 1.4–7.0)
Neutrophils: 34 %
Platelets: 179 10*3/uL (ref 150–450)
RBC: 4.31 x10E6/uL (ref 4.14–5.80)
RDW: 12.7 % (ref 11.6–15.4)
WBC: 6.5 10*3/uL (ref 3.4–10.8)

## 2021-07-22 LAB — BASIC METABOLIC PANEL
BUN/Creatinine Ratio: 12 (ref 10–24)
BUN: 11 mg/dL (ref 8–27)
CO2: 25 mmol/L (ref 20–29)
Calcium: 9 mg/dL (ref 8.6–10.2)
Chloride: 104 mmol/L (ref 96–106)
Creatinine, Ser: 0.9 mg/dL (ref 0.76–1.27)
Glucose: 99 mg/dL (ref 70–99)
Potassium: 3.8 mmol/L (ref 3.5–5.2)
Sodium: 144 mmol/L (ref 134–144)
eGFR: 88 mL/min/{1.73_m2} (ref >59)

## 2021-07-28 ENCOUNTER — Telehealth: Payer: Self-pay | Admitting: Internal Medicine

## 2021-07-28 NOTE — Telephone Encounter (Signed)
Left message on home and mobile lines to call back

## 2021-07-28 NOTE — Telephone Encounter (Signed)
Pt wants to Cancel the appt for 08/08/21  at 1:30pm with [facility]. He received notification that his insurance will not cover this appt.

## 2021-07-31 NOTE — Telephone Encounter (Signed)
Patient is returning call.  °

## 2021-07-31 NOTE — Telephone Encounter (Signed)
Left message to call back  

## 2021-08-01 NOTE — Telephone Encounter (Signed)
The patient is self pay at this time. He is asking for a call with his estimate for the procedure and information about a plan to get this taken care of. Will route to prior authorization department for further assistance.

## 2021-08-07 NOTE — Pre-Procedure Instructions (Signed)
Attempted to call patient regarding procedure instructions.  Left voicemail on the following items: Arrival time 1130 Nothing to eat or drink after midnight No meds AM of procedure Responsible person to drive you home and stay with you for 24 hrs Wash with special soap night before and morning of procedure  

## 2021-08-08 ENCOUNTER — Other Ambulatory Visit: Payer: Self-pay

## 2021-08-08 ENCOUNTER — Encounter (HOSPITAL_COMMUNITY): Payer: Self-pay | Admitting: Internal Medicine

## 2021-08-08 ENCOUNTER — Encounter (HOSPITAL_COMMUNITY)
Admission: RE | Disposition: A | Discharge status: Home / Self Care | Payer: Self-pay | Source: Home / Self Care | Attending: Internal Medicine

## 2021-08-08 ENCOUNTER — Ambulatory Visit (HOSPITAL_COMMUNITY)
Admission: RE | Admit: 2021-08-08 | Discharge: 2021-08-08 | Disposition: A | Discharge status: Home / Self Care | Payer: Self-pay | Attending: Internal Medicine | Admitting: Internal Medicine

## 2021-08-08 DIAGNOSIS — I480 Paroxysmal atrial fibrillation: Secondary | ICD-10-CM | POA: Insufficient documentation

## 2021-08-08 DIAGNOSIS — I442 Atrioventricular block, complete: Secondary | ICD-10-CM | POA: Insufficient documentation

## 2021-08-08 DIAGNOSIS — I472 Ventricular tachycardia, unspecified: Secondary | ICD-10-CM | POA: Insufficient documentation

## 2021-08-08 DIAGNOSIS — I255 Ischemic cardiomyopathy: Secondary | ICD-10-CM

## 2021-08-08 DIAGNOSIS — Z4502 Encounter for adjustment and management of automatic implantable cardiac defibrillator: Secondary | ICD-10-CM

## 2021-08-08 DIAGNOSIS — I5022 Chronic systolic (congestive) heart failure: Secondary | ICD-10-CM | POA: Insufficient documentation

## 2021-08-08 DIAGNOSIS — I110 Hypertensive heart disease with heart failure: Secondary | ICD-10-CM | POA: Insufficient documentation

## 2021-08-08 HISTORY — DX: Ventricular tachycardia, unspecified: I47.20

## 2021-08-08 HISTORY — DX: Atrioventricular block, complete: I44.2

## 2021-08-08 HISTORY — PX: BIV ICD GENERATOR CHANGEOUT: EP1194

## 2021-08-08 SURGERY — BIV ICD GENERATOR CHANGEOUT
Anesthesia: LOCAL

## 2021-08-08 MED ORDER — CEFAZOLIN SODIUM-DEXTROSE 2-4 GM/100ML-% IV SOLN
INTRAVENOUS | Status: AC
  Filled 2021-08-08: qty 100

## 2021-08-08 MED ORDER — SODIUM CHLORIDE 0.9% FLUSH: 3.0000 mL | INTRAVENOUS | Status: DC | PRN

## 2021-08-08 MED ORDER — POVIDONE-IODINE 10 % EX SWAB
2.0000 "application " | Freq: Once | CUTANEOUS | Status: AC
  Administered 2021-08-08: 2 via TOPICAL

## 2021-08-08 MED ORDER — ONDANSETRON HCL 4 MG/2ML IJ SOLN: 4.0000 mg | Freq: Four times a day (QID) | INTRAMUSCULAR | Status: DC | PRN

## 2021-08-08 MED ORDER — SODIUM CHLORIDE 0.9 % IV SOLN
INTRAVENOUS | Status: AC
  Filled 2021-08-08: qty 2

## 2021-08-08 MED ORDER — ACETAMINOPHEN 325 MG PO TABS: 325.0000 mg | ORAL_TABLET | ORAL | Status: DC | PRN

## 2021-08-08 MED ORDER — LIDOCAINE HCL (PF) 1 % IJ SOLN
INTRAMUSCULAR | Status: DC | PRN
  Administered 2021-08-08: 40 mL

## 2021-08-08 MED ORDER — CEFAZOLIN SODIUM-DEXTROSE 2-4 GM/100ML-% IV SOLN
2.0000 g | INTRAVENOUS | Status: AC
  Administered 2021-08-08: 2 g via INTRAVENOUS

## 2021-08-08 MED ORDER — SODIUM CHLORIDE 0.9 % IV SOLN: 250.0000 mL | INTRAVENOUS | Status: DC | PRN

## 2021-08-08 MED ORDER — SODIUM CHLORIDE 0.9% FLUSH: 3.0000 mL | Freq: Two times a day (BID) | INTRAVENOUS | Status: DC

## 2021-08-08 MED ORDER — SODIUM CHLORIDE 0.9 % IV SOLN
80.0000 mg | INTRAVENOUS | Status: AC
  Administered 2021-08-08: 80 mg

## 2021-08-08 MED ORDER — SODIUM CHLORIDE 0.9 % IV SOLN: INTRAVENOUS | Status: DC

## 2021-08-08 MED ORDER — LIDOCAINE HCL (PF) 1 % IJ SOLN
INTRAMUSCULAR | Status: AC
  Filled 2021-08-08: qty 60

## 2021-08-08 MED ORDER — CHLORHEXIDINE GLUCONATE 4 % EX LIQD
4.0000 "application " | Freq: Once | CUTANEOUS | Status: DC
  Filled 2021-08-08: qty 60

## 2021-08-08 SURGICAL SUPPLY — 4 items
CABLE SURGICAL S-101-97-12 (CABLE) ×2 IMPLANT
ICD CLARIA MRI DTMA1Q1 (ICD Generator) ×1 IMPLANT
PAD DEFIB RADIO PHYSIO CONN (PAD) ×2 IMPLANT
TRAY PACEMAKER INSERTION (PACKS) ×2 IMPLANT

## 2021-08-08 NOTE — H&P (Signed)
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  Since last being seen in our clinic, the patient reports doing very well.  Today, he denies symptoms of palpitations, chest pain, shortness of breath,  lower extremity edema, dizziness, presyncope, syncope, or ICD shocks.  The patient is otherwise without complaint today.        Past Medical History:  Diagnosis Date   Acute renal failure ([place]) 05/31/2014   Angina decubitus ([place]) 06/15/2014   CAD (coronary artery disease)      a. s/p stent RCA DES 9/09; b. 2014 Attempted PCI of OM1 @ [place];  c. 04/2014 Cath: LAD 40-50p, D1 95-99 (chronic), LCX 30-40 inf branch, OM1 CTO, RCA 30-40p, RCA patent stent, EF 35%->Med Rx.   Chronic combined systolic and diastolic CHF (congestive heart failure) (HCC)      a. EF about 40 to 45% per echo in April 2013;  b. 04/2014 Echo: EF 20-25%, sev LVH, sev glob HK, Gr 1 DD, mildly reduced RV fxn, PASP 81mmHg. c. 01/2017: EF improved to 50-55%.    Chronic low back pain     COPD (chronic obstructive pulmonary disease) ([place]) dx 06/2013    PFTs 07/08/13: mod obst with resp to bronchodilator, moderate decrease diffusion, airtrapping   COPD with acute exacerbation ([place]) 01/17/2017   COPD with asthma ([place])      07/08/13 PFT: FEV1 1.74L (66% pred, 30% change with BD), mod obst with resp to bronchodilator, moderate decrease diffusion, air-trapping 11/2013 Simple spiro>> clear obstruction, FEV1 1.30 L (47% pred) - trial of symbicort 160 2bid 01/26/15      Depression 09/15/2018    Assessment: Increased sadness and depression since losing job  Plan: Patient denies suicidal and homicidal ideations Patient would not like to start medication therapies at this time Will establish patient with community health and wellness for primary care   Dyslipidemia      a. on statin   Essential hypertension     HLD (hyperlipidemia)     HTN (hypertension)      a. Reports intolerance to  hydralazine; b. no beta blockers 2/2 bradycardia;  c. failed on ACE and ARB.   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 04/2014 Echo: EF 20-25%, sev glob HK.   Mixed Ischemic/Non-Ischemic Cardiomyopathy      probable mixed ischemic and non-ischemic    Noncompliance     NSVT (nonsustained ventricular tachycardia)     OSA (obstructive sleep apnea) 04/18/2018    06/11/16 - home sleep study shows AHI of 2.9 an hour with the lowest SaO2 of 79% with an average of 93%  03/08/2018-Home sleep study-AHI 7/HR, SaO2 low 81%    Paroxysmal atrial fibrillation ([place])      a. identified on device interrogation 01/2016   Pneumonia 09/02/2013   Pulmonary edema 05/10/2014   Respiratory arrest ([place]) 05/18/2014   Sinus bradycardia 05/31/2014   Syncope 05/30/2014         Past Surgical History:  Procedure Laterality Date   BI-VENTRICULAR IMPLANTABLE CARDIOVERTER DEFIBRILLATOR N/A 08/12/2014    MDT [name] CRTD implanted by Dr Rayann Heman   CARDIAC CATHETERIZATION        ejection fraction 50%   LEFT HEART CATHETERIZATION WITH CORONARY ANGIOGRAM N/A 05/08/2014  Procedure: LEFT HEART CATHETERIZATION WITH CORONARY ANGIOGRAM;  Surgeon: Blane Ohara, MD;  Location: [facility] CATH LAB;  Service: Cardiovascular;  Laterality: N/A;      ROS- all systems are reviewed and negative except as per HPI above         Current Outpatient Medications  Medication Sig Dispense Refill   albuterol (PROVENTIL) (2.5 MG/3ML) 0.083% nebulizer solution Take 3 mLs (2.5 mg total) by nebulization every 6 (six) hours as needed for wheezing or shortness of breath. 360 mL 11   albuterol (VENTOLIN HFA) 108 (90 Base) MCG/ACT inhaler Inhale 1 puff into the lungs every 6 (six) hours as needed for wheezing or shortness of breath. 1 each 0   amLODipine (NORVASC) 5 MG tablet TAKE 1 TABLET (5 MG TOTAL) BY  MOUTH DAILY. PLEASE KEEP UPCOMING APPT IN MAY 2022 BEFORE ANYMORE REFILLS. THANK YOU 90 tablet 3   atorvastatin (LIPITOR) 20 MG tablet Take 1 tablet (20 mg total) by mouth daily. 90 tablet 3   budesonide-formoterol (SYMBICORT) 160-4.5 MCG/ACT inhaler TAKE 2 PUFFS BY MOUTH TWICE A DAY 10.2 each 3   ciprofloxacin (CIPRO) 500 MG tablet Take 1 tablet (500 mg total) by mouth every 12 (twelve) hours. 10 tablet 0   clopidogrel (PLAVIX) 75 MG tablet TAKE 1 TABLET DAILY. PLEASE KEEP APPOINTMENT FOR MORE REFILLS. 90 tablet 3   isosorbide mononitrate (IMDUR) 60 MG 24 hr tablet Take 1 tablet (60 mg total) by mouth daily. 90 tablet 2   losartan (COZAAR) 100 MG tablet Take 1 tablet (100 mg total) by mouth daily. 90 tablet 3   metoprolol tartrate (LOPRESSOR) 100 MG tablet Take 1 tablet (100 mg total) by mouth 2 (two) times daily. 180 tablet 1   nitroGLYCERIN (NITROSTAT) 0.4 MG SL tablet Place 1 tablet (0.4 mg total) under the tongue every 5 (five) minutes as needed for chest pain. 25 tablet 3   potassium chloride SA (K-DUR,KLOR-CON) 20 MEQ tablet Take 40 mEq by mouth daily.       traMADol (ULTRAM) 50 MG tablet Take 1 tablet (50 mg total) by mouth every 6 (six) hours as needed. 10 tablet 0    No current facility-administered medications for this visit.      Physical Exam: Vitals:   08/08/21 1143 08/08/21 1228  BP:  (!) 185/84  Pulse: (P) 83 (S) (!) 45  Resp:  14  SpO2: (P) 98% 99%   GEN- The patient is well appearing, alert and oriented x 3 today.   Head- normocephalic, atraumatic Eyes-  Sclera clear, conjunctiva pink Ears- hearing intact Oropharynx- clear Lungs- Clear to ausculation bilaterally, normal work of breathing Chest- ICD pocket is well healed Heart- Regular rate and rhythm, no murmurs, rubs or gallops, PMI not laterally displaced GI- soft, NT, ND, + BS Extremities- no clubbing, cyanosis, or edema   ICD remotes are reviewed.  He is now at [facility]    Assessment and Plan:   1.  Chronic  systolic dysfunction/ ischemic CM euvolemic today EF has improved to 55% with CRT! VT is well controlled Stable on an appropriate medical regimen Normal BiV ICD function but has reached RRT  See Pace Art report No changes today he is not device dependant today   ICD Criteria  Current LVEF:55%. Within 12 months prior to implant: no.  EF 20% 05/08/2014 (prior to CRT)  Heart failure history: Yes, Class II  Cardiomyopathy history: Yes, Mixed Ischemic and Non-Ischemic Cardiomyopathies.  Atrial Fibrillation/Atrial Flutter: Yes, Paroxysmal.  Ventricular tachycardia history: Yes,  Hemodynamic instability present. VT Type: Sustained Ventricular Tachycardia - Monomorphic.  Cardiac arrest history: Yes, Ventricular Tachycardia.  History of syndromes with risk of sudden death: No.  Previous ICD: Yes, Reason for ICD:  Primary prevention.  Current ICD indication: Secondary  PPM indication: yes CRT (BiV pacing)  Class I or II Bradycardia indication present: Yes  Beta Blocker therapy for 3 or more months: Yes, prescribed.   Ace Inhibitor/ARB therapy for 3 or more months: Yes, prescribed.    I have seen [name] is a 80 y.o. male pre-procedural for consideration of BiV ICD generator change for secondary prevention of sudden death.  The patient's chart has been reviewed and they meet criteria for ICD generator change.  I have had a thorough discussion with the patient reviewing options.  The patient has had opportunities to ask questions and have them answered. The patient and I have decided together through the [employer] [facility] Heart Care Share Decision Support Tool to proceed with BiV ICD generator change at this time.  Risks, benefits, alternatives to BiV ICD generator change were discussed in detail with the patient today. The patient  understands that the risks include but are not limited to bleeding, infection, pneumothorax, perforation, tamponade, vascular damage, renal failure, MI, stroke,  death, inappropriate shocks, and lead dislodgement and wishes to proceed.     Hillis Range MD, [facility] 08/08/2021 1:11 PM

## 2021-08-08 NOTE — Discharge Instructions (Addendum)
Resume plavix on 08/11/21 (Friday)   Implantable Cardiac Device Battery Change, Care After  This sheet gives you information about how to care for yourself after your procedure. Your health care provider may also give you more specific instructions. If you have problems or questions, contact your health care provider. What can I expect after the procedure? After your procedure, it is common to have: Pain or soreness at the site where the cardiac device was inserted. Swelling at the site where the cardiac device was inserted. You should received an information card for your new device in 4-8 weeks. Follow these instructions at home: Incision care  Keep the incision clean and dry. Do not take baths, swim, or use a hot tub until after your wound check.  Do not shower for at least 7 days, or as directed by your health care provider. Pat the area dry with a clean towel. Do not rub the area. This may cause bleeding. Follow instructions from your health care provider about how to take care of your incision. Make sure you: Leave stitches (sutures), skin glue, or adhesive strips in place. These skin closures may need to stay in place for 2 weeks or longer. If adhesive strip edges start to loosen and curl up, you may trim the loose edges. Do not remove adhesive strips completely unless your health care provider tells you to do that. Check your incision area every day for signs of infection. Check for: More redness, swelling, or pain. More fluid or blood. Warmth. Pus or a bad smell. Activity Do not lift anything that is heavier than 10 lb (4.5 kg) until your health care provider says it is okay to do so. For the first week, or as long as told by your health care provider: Avoid lifting your affected arm higher than your shoulder. After 1 week, Be gentle when you move your arms over your head. It is okay to raise your arm to comb your hair. Avoid strenuous exercise. Ask your health care provider  when it is okay to: Resume your normal activities. Return to work or school. Resume sexual activity. Eating and drinking Eat a heart-healthy diet. This should include plenty of fresh fruits and vegetables, whole grains, low-fat dairy products, and lean protein like chicken and fish. Limit alcohol intake to no more than 1 drink a day for non-pregnant women and 2 drinks a day for men. One drink equals 12 oz of beer, 5 oz of wine, or 1 oz of hard liquor. Check ingredients and nutrition facts on packaged foods and beverages. Avoid the following types of food: Food that is high in salt (sodium). Food that is high in saturated fat, like full-fat dairy or red meat. Food that is high in trans fat, like fried food. Food and drinks that are high in sugar. Lifestyle Do not use any products that contain nicotine or tobacco, such as cigarettes and e-cigarettes. If you need help quitting, ask your health care provider. Take steps to manage and control your weight. Once cleared, get regular exercise. Aim for 150 minutes of moderate-intensity exercise (such as walking or yoga) or 75 minutes of vigorous exercise (such as running or swimming) each week. Manage other health problems, such as diabetes or high blood pressure. Ask your health care provider how you can manage these conditions. General instructions Do not drive for 24 hours after your procedure if you were given a medicine to help you relax (sedative). Take over-the-counter and prescription medicines only as told by  your health care provider. Avoid putting pressure on the area where the cardiac device was placed. If you need an MRI after your cardiac device has been placed, be sure to tell the health care provider who orders the MRI that you have a cardiac device. Avoid close and prolonged exposure to electrical devices that have strong magnetic fields. These include: Cell phones. Avoid keeping them in a pocket near the cardiac device, and try using  the ear opposite the cardiac device. MP3 players. Household appliances, like microwaves. Metal detectors. Electric generators. High-tension wires. Keep all follow-up visits as directed by your health care provider. This is important. Contact a health care provider if: You have pain at the incision site that is not relieved by over-the-counter or prescription medicines. You have any of these around your incision site or coming from it: More redness, swelling, or pain. Fluid or blood. Warmth to the touch. Pus or a bad smell. You have a fever. You feel brief, occasional palpitations, light-headedness, or any symptoms that you think might be related to your heart. Get help right away if: You experience chest pain that is different from the pain at the cardiac device site. You develop a red streak that extends above or below the incision site. You experience shortness of breath. You have palpitations or an irregular heartbeat. You have light-headedness that does not go away quickly. You faint or have dizzy spells. Your pulse suddenly drops or increases rapidly and does not return to normal. You begin to gain weight and your legs and ankles swell. Summary After your procedure, it is common to have pain, soreness, and some swelling where the cardiac device was inserted. Make sure to keep your incision clean and dry. Follow instructions from your health care provider about how to take care of your incision. Check your incision every day for signs of infection, such as more pain or swelling, pus or a bad smell, warmth, or leaking fluid and blood. Avoid strenuous exercise and lifting your left arm higher than your shoulder for 2 weeks, or as long as told by your health care provider. This information is not intended to replace advice given to you by your health care provider. Make sure you discuss any questions you have with your health care provider.

## 2021-08-09 ENCOUNTER — Encounter (HOSPITAL_COMMUNITY): Payer: Self-pay | Admitting: Internal Medicine

## 2021-08-10 ENCOUNTER — Encounter (HOSPITAL_COMMUNITY): Payer: Self-pay | Admitting: Internal Medicine

## 2021-08-23 ENCOUNTER — Ambulatory Visit: Payer: Self-pay

## 2021-09-05 NOTE — Progress Notes (Signed)
Cardiology Office Note Date:  09/05/2021  Patient ID:  [name], [name] 1940/12/26, MRN [id number] PCP:  Patient, No Pcp Per (Inactive)  Cardiologist:  Dr. Martinique Electrophysiologist: Dr. Rayann Heman    Chief Complaint:  wound check  History of Present Illness: [name] is a 80 y.o. male with history of CAD (2014 cardiac cath showed patent RCA stent. There was a 50% mid LAD lesion. The first diagonal and first OM were occluded. Attempted PCI of the OM but unable to cross with wire), ICM, chronic CHF (systolic), NSVT, COPD (home O2 at HS), former smoker, HTN, HLD, SCAF.  EF has waxed/waned  I saw him May 2022 He again mentions SOB that waxes/wanes. He has not seen pulmonary in 2 years and mentions it has been many, many years since he has had a PMD. No CP, palpitations or particular cardiac awareness. No near syncope or syncope, has had an infrequent fleeting dizzy spell, not positional or with change in position, lasts just a few seconds, and is infrequent. His inhalers were refilled and given referral to pulmonary Device was nearing ERI BB increased for some NSVT No true AFib noted  He is now s/p ICD gen change on 08/08/21, missed his wound check visit on 12/14.  TODAY He is out of his Symbicort and has increased cough without it, denies symptoms of illness, no fever, chills.  Says it is a dry cough./ Continues to smoke some, but only "once in a while"  Does not think he is retaining fluid. NO CP, palpitations No rest SOB Baseline DOE  No near syncope or syncope  He inquires/mentions cost of his medicines, is without insurance. No PMD and asks if I will refill his symbicort, and if I know how much it will cost    Device information MDT CRT-D implanted 08/12/2014 >> gen change 08/08/21   Past Medical History:  Diagnosis Date   Acute renal failure ([place]) 05/31/2014   Angina decubitus ([place]) 06/15/2014   CAD (coronary artery disease)    a. s/p stent RCA DES 9/09;  b. 2014 Attempted PCI of OM1 @ [place];  c. 04/2014 Cath: LAD 40-50p, D1 95-99 (chronic), LCX 30-40 inf branch, OM1 CTO, RCA 30-40p, RCA patent stent, EF 35%->Med Rx.   Chronic combined systolic and diastolic CHF (congestive heart failure) (HCC)    a. EF about 40 to 45% per echo in April 2013;  b. 04/2014 Echo: EF 20-25%, sev LVH, sev glob HK, Gr 1 DD, mildly reduced RV fxn, PASP 21mmHg. c. 01/2017: EF improved to 50-55%.    Chronic low back pain    Complete heart block (HCC)    COPD (chronic obstructive pulmonary disease) ([place]) dx 06/2013   PFTs 07/08/13: mod obst with resp to bronchodilator, moderate decrease diffusion, airtrapping   COPD with acute exacerbation ([place]) 01/17/2017   COPD with asthma ([place])    07/08/13 PFT: FEV1 1.74L (66% pred, 30% change with BD), mod obst with resp to bronchodilator, moderate decrease diffusion, air-trapping 11/2013 Simple spiro>> clear obstruction, FEV1 1.30 L (47% pred) - trial of symbicort 160 2bid 01/26/15      Depression 09/15/2018   Assessment: Increased sadness and depression since losing job  Plan: Patient denies suicidal and homicidal ideations Patient would not like to start medication therapies at this time Will establish patient with [facility] for primary care   Dyslipidemia    a. on statin   Essential hypertension    HLD (hyperlipidemia)  HTN (hypertension)    a. Reports intolerance to hydralazine; b. no beta blockers 2/2 bradycardia;  c. failed on ACE and ARB.   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 04/2014 Echo: EF 20-25%, sev glob HK.   Mixed Ischemic/Non-Ischemic Cardiomyopathy    probable mixed ischemic and non-ischemic    Noncompliance    OSA (obstructive sleep apnea) 04/18/2018   06/11/16 - home sleep study shows AHI of 2.9 an hour with the lowest SaO2 of 79% with  an average of 93%  03/08/2018-Home sleep study-AHI 7/HR, SaO2 low 81%    Paroxysmal atrial fibrillation ([place])    a. identified on device interrogation 01/2016   Pneumonia 09/02/2013   Pulmonary edema 05/10/2014   Respiratory arrest ([place]) 05/18/2014   Sinus bradycardia 05/31/2014   Ventricular tachycardia    treated with ATP,  CL 250-300 msec    Past Surgical History:  Procedure Laterality Date   BI-VENTRICULAR IMPLANTABLE CARDIOVERTER DEFIBRILLATOR N/A 08/12/2014   MDT [name] CRTD implanted by Dr Rayann Heman   BIV ICD GENERATOR CHANGEOUT N/A 08/08/2021   Procedure: BIV ICD GENERATOR CHANGEOUT;  Surgeon: Thompson Grayer, MD;  Location: [facility];  Service: Cardiovascular;  Laterality: N/A;   CARDIAC CATHETERIZATION     ejection fraction 50%   LEFT HEART CATHETERIZATION WITH CORONARY ANGIOGRAM N/A 05/08/2014   Procedure: LEFT HEART CATHETERIZATION WITH CORONARY ANGIOGRAM;  Surgeon: Blane Ohara, MD;  Location[facility] C[facility];  Service: Cardiovascular;  Laterality: N/A;    Current Outpatient Medications  Medication Sig Dispense Refill   albuterol (PROVENTIL) (2.5 MG/3ML) 0.083% nebulizer solution Take 3 mLs (2.5 mg total) by nebulization every 6 (six) hours as needed for wheezing or shortness of breath. 360 mL 11   albuterol (VENTOLIN HFA) 108 (90 Base) MCG/ACT inhaler Inhale 1 puff into the lungs every 6 (six) hours as needed for wheezing or shortness of breath. 1 each 0   amLODipine (NORVASC) 5 MG tablet TAKE 1 TABLET (5 MG TOTAL) BY MOUTH DAILY. PLEASE KEEP UPCOMING APPT IN MAY 2022 BEFORE ANYMORE REFILLS. THANK YOU (Patient taking differently: Take 5 mg by mouth in the morning and at bedtime. Please keep upcoming appt in May 2022 before anymore refills. Thank you) 90 tablet 3   atorvastatin (LIPITOR) 20 MG tablet Take 1 tablet (20 mg total) by mouth daily. 90 tablet 3   budesonide-formoterol (SYMBICORT) 160-4.5 MCG/ACT inhaler TAKE 2 PUFFS BY MOUTH TWICE A DAY 10.2 each 3    ciprofloxacin (CIPRO) 500 MG tablet Take 1 tablet (500 mg total) by mouth every 12 (twelve) hours. (Patient not taking: Reported on 08/07/2021) 10 tablet 0   clopidogrel (PLAVIX) 75 MG tablet TAKE 1 TABLET DAILY. PLEASE KEEP APPOINTMENT FOR MORE REFILLS. 90 tablet 3   isosorbide mononitrate (IMDUR) 60 MG 24 hr tablet Take 1 tablet (60 mg total) by mouth daily. 90 tablet 2   losartan (COZAAR) 100 MG tablet Take 1 tablet (100 mg total) by mouth daily. 90 tablet 3   metoprolol tartrate (LOPRESSOR) 100 MG tablet Take 1 tablet (100 mg total) by mouth 2 (two) times daily. 180 tablet 1   nitroGLYCERIN (NITROSTAT) 0.4 MG SL tablet Place 1 tablet (0.4 mg total) under the tongue every 5 (five) minutes as needed for chest pain. 25 tablet 3   potassium chloride SA (K-DUR,KLOR-CON) 20 MEQ tablet  Take 40 mEq by mouth daily.     traMADol (ULTRAM) 50 MG tablet Take 1 tablet (50 mg total) by mouth every 6 (six) hours as needed. (Patient not taking: Reported on 08/07/2021) 10 tablet 0   No current facility-administered medications for this visit.    Allergies:   Acyclovir and related and Aspirin   Social History:  The patient  reports that he quit smoking about 8 years ago. His smoking use included cigarettes. He has a 28.00 pack-year smoking history. He has never used smokeless tobacco. He reports that he does not drink alcohol and does not use drugs.   Family History:  The patient's family history is not on file.  ROS:  Please see the history of present illness.    All other systems are reviewed and otherwise negative.   PHYSICAL EXAM:  VS:  There were no vitals taken for this visit. BMI: There is no height or weight on file to calculate BMI. Well nourished, well developed, in no acute distress HEENT: normocephalic, atraumatic Neck: no JVD, carotid bruits or masses Cardiac:  RRR; no significant murmurs, no rubs, or gallops Lungs:  Bronchial BS that partially clear with cough, very end exp wheezes, moving  air,no rhonchi or rales Abd: soft, nontender MS: no deformity or atrophy Ext: no edema Skin: warm and dry, no rash Neuro:  No gross deficits appreciated Psych: euthymic mood, full affect   ICD site is stable, is well healed, no erythema, edema, heat, no tethering or discomfort   EKG:  not done today  Device interrogation done today and reviewed by myself:  Battery and lead measurements are stable +NSVT One AF episode, 30 seconds, markers only, not clearly true AF No R waves at 40bpm   01/28/2020: TTE IMPRESSIONS   1. Left ventricular ejection fraction, by estimation, is 50 to 55%. The  left ventricle has low normal function. The left ventricle has no regional  wall motion abnormalities. There is moderate concentric left ventricular  hypertrophy. Left ventricular  diastolic parameters are consistent with Grade I diastolic dysfunction  (impaired relaxation).   2. Right ventricular systolic function is normal. The right ventricular  size is normal.   3. Left atrial size was moderately dilated.   4. The mitral valve is normal in structure. Mild mitral valve  regurgitation. No evidence of mitral stenosis.   5. The aortic valve is tricuspid. Aortic valve regurgitation is not  visualized. No aortic stenosis is present.   6. The inferior vena cava is normal in size with greater than 50%  respiratory variability, suggesting right atrial pressure of 3 mmHg.    07/05/2016: CPX Conclusion: The interpretation of this test is limited due to submaximal effort during the exercise. Based on available data, exercise testing with gas exchange demonstrates moderate functional impairment. Pre-exercise spirometry suggests mild obstructive pattern. With significantly hypertensive response to exercise, flat O2 pulse and elevated VE/VCO2 slope patient is most likely limited due to diastolic dysfunction and possible early pulmonary vascular disease.   Test, report and preliminary impression by:   Lesia Hausen, MS, ACSM-RCEP  07/05/2016 9:46 AM  Preliminary CPX Results, Finalized results will be forwarded when completed by interpreting physician.    Agree with above. This is submax test with mild functional limitation that is multifactorial in nature including COPD, deconditioning and severe hypertensive response with mild circulatory limitation.  Would consider exercise training program and tighter BP control.    02/20/2016: stress myoview Nuclear stress EF: 39%. Diffuse hypokinesis with  asynchronous contraction consistent with pacemaker There was no ST segment deviation noted during stress. Defect 1: There is a small defect of moderate severity present in the mid inferolateral location. Question possible focal lateral infarction. Findings consistent with prior myocardial infarction. No areas of ischemia identified. This is an intermediate risk study.   Recent Labs: 02/20/2021: ALT 14; Magnesium 1.8 07/21/2021: BUN 11; Creatinine, Ser 0.90; Hemoglobin 12.0; Platelets 179; Potassium 3.8; Sodium 144  02/20/2021: Chol/HDL Ratio 3.3; Cholesterol, Total 212; HDL 65; LDL Chol Calc (NIH) 133; Triglycerides 80   CrCl cannot be calculated (Patient's most recent lab result is older than the maximum 21 days allowed.).   Wt Readings from Last 3 Encounters:  08/08/21 155 lb (70.3 kg)  06/12/21 149 lb 9.6 oz (67.9 kg)  02/28/21 152 lb 3.2 oz (69 kg)     Other studies reviewed: Additional studies/records reviewed today include: summarized above  ASSESSMENT AND PLAN:  1. ICD     Intact function, no programming changes made  2. CAD     No anginal symptoms     On plavix, BB,statin, nitrate     Follows with Dr. Martinique, due for a visit  3. ICM 4. Chronic CHF (systolic)     Recovered LVEF by echo last year     No clear evidence of volume OL, not edematous, cough I suspect cough/exam findings is his COPD     On BB, ARB, nitrate   5. HTN     He says is usually better at home     He  reports compliance with his medicine  6. HLD     Not addressed today  7. SCAF     CHA2DS2Vasc is 5     One suspected false episode 30 seconds      follow      8. NSVT     Not new for him      9. COPD     I have urged him to get a PMD to better management of his COPD and to quit smoking     We have provided information for [facility]     I have authorized refil on his symbicort for 3 refills, he understands he needs to get established with a PMD clinic   I suspect perhaps he at times might not be able to get his medicines all of the time, but he says he is. We reviewed his cardiac medicines and reduced cost programs for medicines available, looks like [facility] has all of his cardiac meds on their program, he says their prices are much better then [facility]         Disposition: he is due to see Dr. Jackquline Berlin, will continue remotes as usual and in clinic with EP in a year, sooner if needed  Current medicines are reviewed at length with the patient today.  The patient did not have any concerns regarding medicines.  Venetia Night, PA-C 09/05/2021 6:12 PM     [place]  [phone] (office)  [phone] (fax)

## 2021-09-07 ENCOUNTER — Encounter: Payer: Self-pay | Admitting: Physician Assistant

## 2021-09-07 ENCOUNTER — Other Ambulatory Visit: Payer: Self-pay

## 2021-09-07 ENCOUNTER — Ambulatory Visit (INDEPENDENT_AMBULATORY_CARE_PROVIDER_SITE_OTHER): Payer: Self-pay | Admitting: Physician Assistant

## 2021-09-07 VITALS — BP 160/64 | HR 60 | Ht 68.5 in | Wt 148.0 lb

## 2021-09-07 DIAGNOSIS — I5022 Chronic systolic (congestive) heart failure: Secondary | ICD-10-CM

## 2021-09-07 DIAGNOSIS — J449 Chronic obstructive pulmonary disease, unspecified: Secondary | ICD-10-CM

## 2021-09-07 DIAGNOSIS — I4729 Other ventricular tachycardia: Secondary | ICD-10-CM

## 2021-09-07 DIAGNOSIS — I255 Ischemic cardiomyopathy: Secondary | ICD-10-CM

## 2021-09-07 DIAGNOSIS — I429 Cardiomyopathy, unspecified: Secondary | ICD-10-CM

## 2021-09-07 DIAGNOSIS — I5042 Chronic combined systolic (congestive) and diastolic (congestive) heart failure: Secondary | ICD-10-CM

## 2021-09-07 DIAGNOSIS — Z95810 Presence of automatic (implantable) cardiac defibrillator: Secondary | ICD-10-CM

## 2021-09-07 DIAGNOSIS — I480 Paroxysmal atrial fibrillation: Secondary | ICD-10-CM

## 2021-09-07 DIAGNOSIS — Z5189 Encounter for other specified aftercare: Secondary | ICD-10-CM

## 2021-09-07 LAB — CUP PACEART INCLINIC DEVICE CHECK
Battery Remaining Longevity: 77 mo
Battery Voltage: 3.07 V
Brady Statistic AP VP Percent: 70.04 %
Brady Statistic AP VS Percent: 0.04 %
Brady Statistic AS VP Percent: 29.62 %
Brady Statistic AS VS Percent: 0.3 %
Brady Statistic RA Percent Paced: 68.79 %
Brady Statistic RV Percent Paced: 98.14 %
Date Time Interrogation Session: 20221229181453
HighPow Impedance: 62 Ohm
Implantable Lead Implant Date: 20151203
Implantable Lead Implant Date: 20151203
Implantable Lead Implant Date: 20151203
Implantable Lead Location: 753858
Implantable Lead Location: 753859
Implantable Lead Location: 753860
Implantable Lead Model: 4598
Implantable Lead Model: 5076
Implantable Lead Model: 6935
Implantable Pulse Generator Implant Date: 20221129
Lead Channel Impedance Value: 160.941
Lead Channel Impedance Value: 165.029
Lead Channel Impedance Value: 171 Ohm
Lead Channel Impedance Value: 175.622
Lead Channel Impedance Value: 175.622
Lead Channel Impedance Value: 247 Ohm
Lead Channel Impedance Value: 304 Ohm
Lead Channel Impedance Value: 342 Ohm
Lead Channel Impedance Value: 342 Ohm
Lead Channel Impedance Value: 342 Ohm
Lead Channel Impedance Value: 361 Ohm
Lead Channel Impedance Value: 399 Ohm
Lead Channel Impedance Value: 399 Ohm
Lead Channel Impedance Value: 532 Ohm
Lead Channel Impedance Value: 551 Ohm
Lead Channel Impedance Value: 551 Ohm
Lead Channel Impedance Value: 551 Ohm
Lead Channel Impedance Value: 589 Ohm
Lead Channel Pacing Threshold Amplitude: 0.75 V
Lead Channel Pacing Threshold Amplitude: 1.125 V
Lead Channel Pacing Threshold Amplitude: 1.5 V
Lead Channel Pacing Threshold Pulse Width: 0.4 ms
Lead Channel Pacing Threshold Pulse Width: 0.4 ms
Lead Channel Pacing Threshold Pulse Width: 0.4 ms
Lead Channel Sensing Intrinsic Amplitude: 1 mV
Lead Channel Sensing Intrinsic Amplitude: 1.25 mV
Lead Channel Sensing Intrinsic Amplitude: 5.5 mV
Lead Channel Sensing Intrinsic Amplitude: 5.5 mV
Lead Channel Setting Pacing Amplitude: 1.5 V
Lead Channel Setting Pacing Amplitude: 2.25 V
Lead Channel Setting Pacing Amplitude: 3 V
Lead Channel Setting Pacing Pulse Width: 0.4 ms
Lead Channel Setting Pacing Pulse Width: 0.4 ms
Lead Channel Setting Sensing Sensitivity: 0.3 mV

## 2021-09-07 MED ORDER — BUDESONIDE-FORMOTEROL FUMARATE 160-4.5 MCG/ACT IN AERO: INHALATION_SPRAY | RESPIRATORY_TRACT | 3 refills | Status: DC

## 2021-09-07 MED ORDER — CLOPIDOGREL BISULFATE 75 MG PO TABS: ORAL_TABLET | ORAL | 3 refills | Status: DC

## 2021-09-07 NOTE — Patient Instructions (Signed)
Medication Instructions:  Your physician recommends that you continue on your current medications as directed. Please refer to the Current Medication list given to you today.  *If you need a refill on your cardiac medications before your next appointment, please call your pharmacy*   Lab Work:  NONE ORDERED  TODAY   If you have labs (blood work) drawn today and your tests are completely normal, you will receive your results only by: MyChart Message (if you have MyChart) OR A paper copy in the mail If you have any lab test that is abnormal or we need to change your treatment, we will call you to review the results.   Testing/Procedures:  NONE ORDERED  TODAY   Follow-Up: At [facility], you and your health needs are our priority.  As part of our continuing mission to provide you with exceptional heart care, we have created designated Provider Care Teams.  These Care Teams include your primary Cardiologist (physician) and Advanced Practice Providers (APPs -  Physician Assistants and Nurse Practitioners) who all work together to provide you with the care you need, when you need it.  We recommend signing up for the patient portal called "MyChart".  Sign up information is provided on this After Visit Summary.  MyChart is used to connect with patients for Virtual Visits (Telemedicine).  Patients are able to view lab/test results, encounter notes, upcoming appointments, etc.  Non-urgent messages can be sent to your provider as well.   To learn more about what you can do with MyChart, go to [email or web address].    Your next appointment:   1 year(s)  The format for your next appointment:   In Person  Provider:   You may see Dr. Elberta Fortis  or one of the following Advanced Practice Providers on your designated Care Team:   Francis Dowse, [place] Casimiro Needle "Mardelle Matte" Lanna Poche, [place]    Other Instructions [facility] [place], [place] Address: [address] [place], [place] [address] Hours: 8:30AM-5:30 PM Phone: [phone]

## 2021-10-16 ENCOUNTER — Other Ambulatory Visit: Payer: Self-pay

## 2021-10-16 ENCOUNTER — Encounter (HOSPITAL_COMMUNITY): Payer: Self-pay | Admitting: *Deleted

## 2021-10-16 ENCOUNTER — Ambulatory Visit (INDEPENDENT_AMBULATORY_CARE_PROVIDER_SITE_OTHER): Payer: Self-pay

## 2021-10-16 ENCOUNTER — Ambulatory Visit (HOSPITAL_COMMUNITY)
Admission: EM | Admit: 2021-10-16 | Discharge: 2021-10-16 | Disposition: A | Discharge status: Home / Self Care | Payer: Self-pay | Attending: Family Medicine | Admitting: Family Medicine

## 2021-10-16 DIAGNOSIS — S82832A Other fracture of upper and lower end of left fibula, initial encounter for closed fracture: Secondary | ICD-10-CM

## 2021-10-16 DIAGNOSIS — W19XXXA Unspecified fall, initial encounter: Secondary | ICD-10-CM

## 2021-10-16 DIAGNOSIS — M25572 Pain in left ankle and joints of left foot: Secondary | ICD-10-CM

## 2021-10-16 NOTE — ED Provider Notes (Signed)
[place]    CSN: [id number] Arrival date & time: 10/16/21  0803      History   Chief Complaint Chief Complaint  Patient presents with   Fall   Ankle Pain    HPI [name] is a 81 y.o. male.   Patient is here for left ankle pain after a fall.  He was walking down a ramp, which was icy.  He fell and had immediate pain and swelling of the left ankle.  He attempted to bear weight but was unable to do so.  He is having pain and swelling to the left lateral ankle. He is without insurance.   Fall  Ankle Pain    Past Medical History:  Diagnosis Date   Acute renal failure ([place]) 05/31/2014   Angina decubitus ([place]) 06/15/2014   CAD (coronary artery disease)    a. s/p stent RCA DES 9/09; b. 2014 Attempted PCI of OM1 @ [facility];  c. 04/2014 Cath: LAD 40-50p, D1 95-99 (chronic), LCX 30-40 inf branch, OM1 CTO, RCA 30-40p, RCA patent stent, EF 35%->Med Rx.   Chronic combined systolic and diastolic CHF (congestive heart failure) (HCC)    a. EF about 40 to 45% per echo in April 2013;  b. 04/2014 Echo: EF 20-25%, sev LVH, sev glob HK, Gr 1 DD, mildly reduced RV fxn, PASP 15mmHg. c. 01/2017: EF improved to 50-55%.    Chronic low back pain    Complete heart block (HCC)    COPD (chronic obstructive pulmonary disease) ([place]) dx 06/2013   PFTs 07/08/13: mod obst with resp to bronchodilator, moderate decrease diffusion, airtrapping   COPD with acute exacerbation ([place]) 01/17/2017   COPD with asthma ([place])    07/08/13 PFT: FEV1 1.74L (66% pred, 30% change with BD), mod obst with resp to bronchodilator, moderate decrease diffusion, air-trapping 11/2013 Simple spiro>> clear obstruction, FEV1 1.30 L (47% pred) - trial of symbicort 160 2bid 01/26/15      Depression 09/15/2018   Assessment: Increased sadness and depression since losing job  Plan: Patient denies suicidal and homicidal ideations Patient would not like to start medication therapies at this time Will establish patient with  [facility] for primary care   Dyslipidemia    a. on statin   Essential hypertension    HLD (hyperlipidemia)    HTN (hypertension)    a. Reports intolerance to hydralazine; b. no beta blockers 2/2 bradycardia;  c. failed on ACE and ARB.   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 04/2014 Echo: EF 20-25%, sev glob HK.   Mixed Ischemic/Non-Ischemic Cardiomyopathy    probable mixed ischemic and non-ischemic    Noncompliance    OSA (obstructive sleep apnea) 04/18/2018   06/11/16 - home sleep study shows AHI of 2.9 an hour with the lowest SaO2 of 79% with an average of 93%  03/08/2018-Home sleep study-AHI 7/HR, SaO2 low 81%    Paroxysmal atrial fibrillation ([place])    a. 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   Nocturnal hypoxemia 07/03/2016   Facial abscess 02/04/2016   Dental abscess    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/12/2014   Angina decubitus ([place]) 06/15/2014   Acute renal failure ([place]) 05/31/2014   Sinus bradycardia 05/31/2014   Syncope 05/30/2014   Chronic combined systolic and diastolic CHF (congestive heart failure) (HCC)    Mixed Ischemic/Non-Ischemic Cardiomyopathy    NSVT (nonsustained ventricular tachycardia)    HLD (hyperlipidemia)    Respiratory arrest ([place]) 05/18/2014   CAD (coronary artery disease) 05/18/2014   Pulmonary edema 05/10/2014   HTN  (hypertension) 05/08/2014   LBBB (left bundle branch block) 05/08/2014   Thrombocytopathia ([place]) 05/08/2014   Low back pain 10/05/2013   NSVT (nonsustained ventricular tachycardia) 09/04/2013   Pneumonia 09/02/2013   Flu-like symptoms 09/02/2013   Anemia 09/02/2013   COPD with asthma ([place])    Fatigue 06/18/2013   Nocturia 06/18/2013   Dyspnea 06/18/2013   LV dysfunction 12/13/2011   CAD (coronary artery disease) 10/10/2011   Tobacco abuse 10/10/2011   Dyslipidemia    Essential hypertension    Noncompliance     Past Surgical History:  Procedure Laterality Date   BI-VENTRICULAR IMPLANTABLE CARDIOVERTER DEFIBRILLATOR N/A 08/12/2014   MDT [name] CRTD implanted by Dr Rayann Heman   BIV ICD GENERATOR CHANGEOUT N/A 08/08/2021   Procedure: BIV ICD GENERATOR CHANGEOUT;  Surgeon: Thompson Grayer, MD;  Location: [facility];  Service: Cardiovascular;  Laterality: N/A;   CARDIAC CATHETERIZATION     ejection fraction 50%   LEFT HEART CATHETERIZATION WITH CORONARY ANGIOGRAM N/A 05/08/2014   Procedure: LEFT HEART CATHETERIZATION WITH CORONARY ANGIOGRAM;  Surgeon: Blane Ohara, MD;  Location: [facility] CATH LAB;  Service: Cardiovascular;  Laterality: N/A;       Home Medications    Prior to Admission medications   Medication Sig Start Date End Date Taking? Authorizing Provider  potassium chloride SA (K-DUR,KLOR-CON) 20 MEQ tablet Take 40 mEq by mouth daily.   Yes [provider]  acetaminophen (TYLENOL) 650 MG CR tablet Take 650 mg by mouth every 8 (eight) hours as needed for pain.    [provider]  albuterol (PROVENTIL) (2.5 MG/3ML) 0.083% nebulizer solution Take 3 mLs (2.5 mg total) by nebulization every 6 (six) hours as needed for wheezing or shortness of breath. 02/11/18   Lauraine Rinne, NP  albuterol (VENTOLIN HFA) 108 (90 Base) MCG/ACT inhaler Inhale 1 puff into the lungs every 6 (six) hours as needed for wheezing or shortness of breath. 02/07/21   Baldwin Jamaica,  PA-C  amLODipine (NORVASC) 5 MG tablet Take 5 mg by mouth in the morning and at bedtime.    [provider]  atorvastatin (LIPITOR) 20 MG tablet Take 1 tablet (20 mg total) by mouth daily. 02/28/21   [place], Peter M, MD  budesonide-formoterol [facility]) 160-4.5 MCG/ACT inhaler TAKE 2 PUFFS BY MOUTH TWICE A DAY 09/07/21   Baldwin Jamaica, PA-C  clopidogrel (PLAVIX) 75 MG tablet TAKE 1 TABLET DAILY. PLEASE KEEP APPOINTMENT FOR MORE REFILLS. 09/07/21   Baldwin Jamaica, PA-C  isosorbide mononitrate (IMDUR) 60 MG 24 hr tablet Take 1 tablet (60 mg total) by mouth daily. 01/11/20   [place], Peter M, MD  losartan (COZAAR) 100 MG tablet Take 1 tablet (100 mg total) by mouth daily. 01/11/20   [place], Peter M, MD  metoprolol tartrate (LOPRESSOR) 100 MG tablet Take  1 tablet (100 mg total) by mouth 2 (two) times daily. 02/07/21   Sheilah Pigeon, PA-C  nitroGLYCERIN (NITROSTAT) 0.4 MG SL tablet Place 1 tablet (0.4 mg total) under the tongue every 5 (five) minutes as needed for chest pain. 02/07/21 06/28/23  Sheilah Pigeon, PA-C    Family History Family History  Problem Relation Age of Onset   Heart attack Neg Hx    Stroke Neg Hx     Social History Social History   Tobacco Use   Smoking status: Former    Packs/day: 0.50    Years: 56.00    Pack years: 28.00    Types: Cigarettes    Quit date: 09/10/2012    Years since quitting: 9.1   Smokeless tobacco: Never   Tobacco comments:    tried 1 month ago (05/2018) but didn't like it   Vaping Use   Vaping Use: Never used  Substance Use Topics   Alcohol use: No   Drug use: No     Allergies   Acyclovir and related and Aspirin   Review of Systems Review of Systems  Constitutional: Negative.   HENT: Negative.    Respiratory: Negative.    Cardiovascular: Negative.   Gastrointestinal: Negative.   Genitourinary: Negative.     Physical Exam Triage Vital Signs ED Triage Vitals  Enc Vitals Group     BP 10/16/21 0832 (!) 191/92      Pulse Rate 10/16/21 0832 75     Resp 10/16/21 0832 20     Temp 10/16/21 0832 99.4 F (37.4 C)     Temp src --      SpO2 10/16/21 0832 94 %     Weight --      Height --      Head Circumference --      Peak Flow --      Pain Score 10/16/21 0820 10     Pain Loc --      Pain Edu? --      Excl. in GC? --    No data found.  Updated Vital Signs BP (!) 191/92    Pulse 75    Temp 99.4 F (37.4 C)    Resp 20    SpO2 94%   Visual Acuity Right Eye Distance:   Left Eye Distance:   Bilateral Distance:    Right Eye Near:   Left Eye Near:    Bilateral Near:     Physical Exam Constitutional:      Appearance: Normal appearance.  Musculoskeletal:     Comments: Swelling to the left lateral ankle.  He has TTP to the left lateral malleolus and above;  decreased rom due to pain;  full rom at the toes;  toes/foot are warm;  slight swelling to the left foot  Neurological:     Mental Status: He is alert.     UC Treatments / Results  Labs (all labs ordered are listed, but only abnormal results are displayed) Labs Reviewed - No data to display  EKG   Radiology DG Ankle Complete Left  Result Date: 10/16/2021 CLINICAL DATA:  Status post fall, pain EXAM: LEFT ANKLE COMPLETE - 3+ VIEW COMPARISON:  None. FINDINGS: Oblique nondisplaced distal fibular metaphysis fracture. No other fracture or dislocation. No aggressive osseous lesion. No aggressive osseous lesion. Normal alignment. Small ankle joint effusion. Soft tissue are unremarkable. No radiopaque foreign body or soft tissue emphysema. IMPRESSION: 1. Oblique nondisplaced distal fibular metaphysis fracture. Electronically Signed   By:  Kathreen Devoid M.D.   On: 10/16/2021 08:49    Procedures Procedures (including critical care time)  Medications Ordered in UC Medications - No data to display  Initial Impression / Assessment and Plan / UC Course  I have reviewed the triage vital signs and the nursing notes.  Pertinent labs & imaging results  that were available during my care of the patient were reviewed by me and considered in my medical decision making (see chart for details).   Patient is seen for left ankle injury after fall.  He is found to have a nondisplaced fracture of the left fibula.  After consultation with the ortho on call, he was placed in a cam walking boot and recommended weight bear as tolerated.  He is to follow up with ortho in several weeks;  He was instructed to call to make an appointment.  He may use tylenol for pain at this time.    Final Clinical Impressions(s) / UC Diagnoses   Final diagnoses:  Fall, initial encounter  Acute left ankle pain  Closed fracture of distal end of left fibula, unspecified fracture morphology, initial encounter     Discharge Instructions      You were seen today for left ankle pain after fall.  You were found to have a nondisplaced fracture of the distal fibula.  I did speak with the orthopedist who recommended the walking boot.  You may weight bear as tolerated.  Please follow up with orthopedists D. Haddix, phone number [phone].  You may use tylenol 1000mg  every 6hrs for pain.  Please take with food to avoid upset stomach.     ED Prescriptions   None    PDMP not reviewed this encounter.   Rondel Oh, MD 10/16/21 [phone]

## 2021-10-16 NOTE — Discharge Instructions (Addendum)
You were seen today for left ankle pain after fall.  You were found to have a nondisplaced fracture of the distal fibula.  I did speak with the orthopedist who recommended the walking boot.  You may weight bear as tolerated.  Please follow up with orthopedists D. Haddix, phone number [phone].  You may use tylenol 1000mg  every 6hrs for pain.  Please take with food to avoid upset stomach.

## 2021-10-16 NOTE — ED Triage Notes (Signed)
Pt reports slipping on ice that was on W/C ramp at home. Pt has pain to Lt ankle.

## 2021-10-19 NOTE — Progress Notes (Unsigned)
Marylene Land Date of Birth: June 08, 1941 Medical Record [id number]  History of Present Illness: Mr. Turin is seen for follow up of CAD and CHF. He has multiple medical issues which include HTN,  HLD and known CAD. He has a history of tobacco abuse. He has LV dysfunction with ejection fraction of 40-45% in April 2013.  Not able to take higher beta blocker dose due to his resting bradycardia.  He had a Myoview study in March of 2013 which showed normal perfusion. Ejection fraction of 40%. He was admitted to [facility] in December 2014 with chest pain. Cardiac cath showed patent RCA stent. There was a 50% mid LAD lesion. The first diagonal and first OM were occluded. Attempted PCI of the OM but unable to cross with wire. Review of old films here showed that this was a chronic obstruction. He has a history of NSVT. He was admitted in  August 2015 with VDRF with acute pulmonary edema. Echo showed drop in EF to 20-25%. In September 2015 he was admitted with syncope associated with bradycardia and overdiuresis. Diruetics were reduced and coreg was held. Repeat Echo in November 2015 showed EF of 30%. He did undergo ICD/BiV implant  by Dr. Rayann Heman.  In 2017  he complained of dyspnea on exertion. No clinical evidence of CHF. Echo showed EF 45-50%. Global HK. Mild MR. Myoview study showed a fixed inferolateral infarct without ischemia. EF 39%. He ultimately underwent CPX. This was limited by submaximal exercise and severe hypertensive response. There was mild COPD. Exercise training and improved BP control recommended. Toprol was increased.   Followed regularly in EP clinic for his ICD. Last Echo in 2019 showed EF 45% with moderate LVH  When seen in May 2021 noted more SOB. CXR was clear. BNP 118. Echo showed improved EF to 50-55%. No significant valvular disease.   Seen in the ED on 02/13/21 with dizziness. Patient states he was at work and almost passed out. Evaluation was negative except for urine  culture which grew Staph aureus. Treated with antibiotics. Device check showed brief NSVT that did not correlate with symptoms. He is did undergo generator change out of ICD in November for ERI.   On follow up today he states dizziness resolved. No has occasional pain in hip, elbow and sometimes mid chest. Wants tramadol for this. Quit taking lipitor about a year ago. Not smoking.     Current Outpatient Medications on File Prior to Visit  Medication Sig Dispense Refill   acetaminophen (TYLENOL) 650 MG CR tablet Take 650 mg by mouth every 8 (eight) hours as needed for pain.     albuterol (PROVENTIL) (2.5 MG/3ML) 0.083% nebulizer solution Take 3 mLs (2.5 mg total) by nebulization every 6 (six) hours as needed for wheezing or shortness of breath. 360 mL 11   albuterol (VENTOLIN HFA) 108 (90 Base) MCG/ACT inhaler Inhale 1 puff into the lungs every 6 (six) hours as needed for wheezing or shortness of breath. 1 each 0   amLODipine (NORVASC) 5 MG tablet Take 5 mg by mouth in the morning and at bedtime.     atorvastatin (LIPITOR) 20 MG tablet Take 1 tablet (20 mg total) by mouth daily. 90 tablet 3   budesonide-formoterol (SYMBICORT) 160-4.5 MCG/ACT inhaler TAKE 2 PUFFS BY MOUTH TWICE A DAY 10.2 each 3   clopidogrel (PLAVIX) 75 MG tablet TAKE 1 TABLET DAILY. PLEASE KEEP APPOINTMENT FOR MORE REFILLS. 90 tablet 3   isosorbide mononitrate (IMDUR) 60 MG 24 hr  tablet Take 1 tablet (60 mg total) by mouth daily. 90 tablet 2   losartan (COZAAR) 100 MG tablet Take 1 tablet (100 mg total) by mouth daily. 90 tablet 3   metoprolol tartrate (LOPRESSOR) 100 MG tablet Take 1 tablet (100 mg total) by mouth 2 (two) times daily. 180 tablet 1   nitroGLYCERIN (NITROSTAT) 0.4 MG SL tablet Place 1 tablet (0.4 mg total) under the tongue every 5 (five) minutes as needed for chest pain. 25 tablet 3   potassium chloride SA (K-DUR,KLOR-CON) 20 MEQ tablet Take 40 mEq by mouth daily.     No current facility-administered medications  on file prior to visit.    Allergies  Allergen Reactions   Acyclovir And Related     GI upset    Aspirin Other (See Comments)    GI upset at high doses only.    Past Medical History:  Diagnosis Date   Acute renal failure (HCC) 05/31/2014   Angina decubitus (HCC) 06/15/2014   CAD (coronary artery disease)    a. s/p stent RCA DES 9/09; b. 2014 Attempted PCI of OM1 @ [place];  c. 04/2014 Cath: LAD 40-50p, D1 95-99 (chronic), LCX 30-40 inf branch, OM1 CTO, RCA 30-40p, RCA patent stent, EF 35%->Med Rx.   Chronic combined systolic and diastolic CHF (congestive heart failure) (HCC)    a. EF about 40 to 45% per echo in April 2013;  b. 04/2014 Echo: EF 20-25%, sev LVH, sev glob HK, Gr 1 DD, mildly reduced RV fxn, PASP . c. 01/2017: EF improved to 50-55%.    Chronic low back pain    Complete heart block (HCC)    COPD (chronic obstructive pulmonary disease) (HCC) dx 06/2013   PFTs 07/08/13: mod obst with resp to bronchodilator, moderate decrease diffusion, airtrapping   COPD with acute exacerbation (HCC) 01/17/2017   COPD with asthma (HCC)    07/08/13 PFT: FEV1 1.74L (66% pred, 30% change with BD), mod obst with resp to bronchodilator, moderate decrease diffusion, air-trapping 11/2013 Simple spiro>> clear obstruction, FEV1 1.30 L (47% pred) - trial of symbicort 160 2bid 01/26/15      Depression 09/15/2018   Assessment: Increased sadness and depression since losing job  Plan: Patient denies suicidal and homicidal ideations Patient would not like to start medication therapies at this time Will establish patient with [facility] for primary care   Dyslipidemia    a. on statin   Essential hypertension    HLD (hyperlipidemia)    HTN (hypertension)    a. Reports intolerance to hydralazine; b. no beta blockers 2/2 bradycardia;  c. failed on ACE and ARB.   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 04/2014 Echo: EF 20-25%, sev glob HK.   Mixed Ischemic/Non-Ischemic Cardiomyopathy    probable mixed ischemic and non-ischemic    Noncompliance    OSA (obstructive sleep apnea) 04/18/2018   06/11/16 - home sleep study shows AHI of 2.9 an hour with the lowest SaO2 of 79% with an average of 93%  03/08/2018-Home sleep study-AHI 7/HR, SaO2 low 81%    Paroxysmal atrial fibrillation (HCC)    a. identified on device interrogation 01/2016   Pneumonia 09/02/2013   Pulmonary edema 05/10/2014   Respiratory arrest (HCC) 05/18/2014   Sinus bradycardia 05/31/2014   Ventricular tachycardia    treated with ATP,  CL  250-300 msec    Past Surgical History:  Procedure Laterality Date   BI-VENTRICULAR IMPLANTABLE CARDIOVERTER DEFIBRILLATOR N/A 08/12/2014   MDT [name] CRTD implanted by Dr Rayann Heman   BIV ICD GENERATOR CHANGEOUT N/A 08/08/2021   Procedure: BIV ICD GENERATOR CHANGEOUT;  Surgeon: Thompson Grayer, MD;  Location: [place] [facility];  Service: Cardiovascular;  Laterality: N/A;   CARDIAC CATHETERIZATION     ejection fraction 50%   LEFT HEART CATHETERIZATION WITH CORONARY ANGIOGRAM N/A 05/08/2014   Procedure: LEFT HEART CATHETERIZATION WITH CORONARY ANGIOGRAM;  Surgeon: Blane Ohara, MD;  Location: [facility] CATH LAB;  Service: Cardiovascular;  Laterality: N/A;    Social History   Tobacco Use  Smoking Status Former   Packs/day: 0.50   Years: 56.00   Pack years: 28.00   Types: Cigarettes   Quit date: 09/10/2012   Years since quitting: 9.1  Smokeless Tobacco Never  Tobacco Comments   tried 1 month ago (05/2018) but didn't like it     Social History   Substance and Sexual Activity  Alcohol Use No    Family History  Problem Relation Age of Onset   Heart attack Neg Hx    Stroke Neg Hx     Review of Systems: As noted in history of present illness  All other systems were reviewed and are negative.  Physical  Exam: There were no vitals taken for this visit. GENERAL:  Well appearing BM in NAD HEENT:  PERRL, EOMI, sclera are clear. Oropharynx is clear. NECK:  No jugular venous distention, carotid upstroke brisk and symmetric, no bruits, no thyromegaly or adenopathy LUNGS:  Clear to auscultation bilaterally CHEST:  Unremarkable HEART:  RRR,  PMI not displaced or sustained,S1 and S2 within normal limits, no S3, no S4: no clicks, no rubs, no murmurs ABD:  Soft, nontender. BS +, no masses or bruits. No hepatomegaly, no splenomegaly EXT:  2 + pulses throughout, no edema, no cyanosis no clubbing SKIN:  Warm and dry.  No rashes NEURO:  Alert and oriented x 3. Cranial nerves II through XII intact. PSYCH:  Cognitively intact     LABORATORY DATA:   Lab Results  Component Value Date   WBC 6.5 07/21/2021   HGB 12.0 (L) 07/21/2021   HCT 37.4 (L) 07/21/2021   PLT 179 07/21/2021   GLUCOSE 99 07/21/2021   CHOL 212 (H) 02/20/2021   TRIG 80 02/20/2021   HDL 65 02/20/2021   LDLDIRECT 132.9 06/18/2013   LDLCALC 133 (H) 02/20/2021   ALT 14 02/20/2021   AST 18 02/20/2021   NA 144 07/21/2021   K 3.8 07/21/2021   CL 104 07/21/2021   CREATININE 0.90 07/21/2021   BUN 11 07/21/2021   CO2 25 07/21/2021   TSH 2.67 01/31/2016   INR 1.09 06/20/2014    Ecg today shows AV pacing BI V rate 67. I have personally reviewed and interpreted this study.  Echo: 02/20/16:Study Conclusions   - Left ventricle: There was moderate concentric hypertrophy.   Systolic function was mildly reduced. The estimated ejection   fraction was in the range of 45% to 50%. Mild diffuse hypokinesis   with no identifiable regional variations. Abnormal relaxation   with increased filling pressures. - Ventricular septum: Septal motion showed paradox. These changes   are consistent with right ventricular pacing. - Mitral valve: There was mild regurgitation  Myoview 02/20/16: Study Highlights   Nuclear stress EF: 39%. Diffuse  hypokinesis with asynchronous contraction consistent with pacemaker There was no ST  segment deviation noted during stress. Defect 1: There is a small defect of moderate severity present in the mid inferolateral location. Question possible focal lateral infarction. Findings consistent with prior myocardial infarction. No areas of ischemia identified. This is an intermediate risk study.   Candee Furbish, MD   CPX: 07/05/16: Narrative   Referred for: Exertional Dyspnea   Procedure: This patient underwent staged symptom-limited exercise treadmill testing using a modified Naughton protocol with expired gas analysis metabolic evaluation during exercise.  Demographics  Age: 81 Ht. (in.) 68.5 Wt. (lb) 151 BMI: 22.6      Predicted Peak VO2: 26.6  Gender: Male Ht (cm) 174 Wt. (kg) 68.5    Results  Pre-Exercise PFTs   FVC 2.70 (81%)       FEV1 1.64 (64%)         FEV1/FVC 61 (79%)         MVV 70 (58%)         Exercise Time:    9:06   Speed (mph): 2.0       Grade (%): 10.5      RPE: 17  Reason stopped: Test was ended due to hypertensive response achieving termination criteria prior to maximal exercise.  Additional symptoms: bilateral hip pain (5/10) dyspnea (3/10)  Resting HR: 65 Peak HR: 127   (86% age predicted max HR)  BP rest: 180/66 BP peak: 246/94  Peak VO2: 17.5 (66% predicted peak VO2)  VE/VCO2 slope:  33.4  OUES: 1.94  Peak RER: 0.98  Ventilatory Threshold: 16.5 (62% predicted or measured peak VO2)  Peak RR 34  Peak Ventilation:  44.3  VE/MVV:  63%  PETCO2 at peak:  31  O2pulse:  9   (75% predicted O2pulse)   Interpretation  Notes: Patient gave a very good effort. Pulse-oximetry remained 96% or above for the duration of exercise.   ECG:  Resting ECG AV paced rhythm. HR response appropriate. PVCs occasional. There were no sustained arrhythmias and no ST-T changes. BP hypertensive at rest with increasingly significant hypertensive response to incremental  exercise.  PFT:  Pre-exercise spirometry suggests possible mild restrictive/obstructive patterns. MVV below normal.   CPX:  Exercise testing with gas exchange demonstrates a moderately reduced peak VO2 of 17.5 ml/kg/min (66% of the age/gender/weight matched sedentary norms). The RER of 0.98 indicates a submaximal effort. The VE/VCO2 slope is mildly elevated and indicates increased dead space ventiltion. The oxygen uptake efficiency slope (OUES) is moderately reduced. The VO2 at the ventilatory threshold was normal at 62% of the predicted peak VO2. At peak exercise, the ventilation reached 63% of the measured MVV indicating ventilatory reserve remained. The O2pulse (a surrogate for stroke volume) increased with initial exercise but remained flat at 9 ml/beeat (75% predicted).   Conclusion: The interpretation of this test is limited due to submaximal effort during the exercise. Based on available data, exercise testing with gas exchange demonstrates moderate functional impairment. Pre-exercise spirometry suggests mild obstructive pattern. With significantly hypertensive response to exercise, flat O2 pulse and elevated VE/VCO2 slope patient is most likely limited due to diastolic dysfunction and possible early pulmonary vascular disease.    Test, report and preliminary impression by: Landis Martins, MS, ACSM-RCEP 07/05/2016 9:46 AM  Preliminary CPX Results, Finalized results will be forwarded when completed by interpreting physician.   Agree with above. This is submax test with mild functional limitation that is multifactorial in nature including COPD, deconditioning and severe hypertensive response with mild circulatory limitation.  Would consider exercise training program and  tighter BP control.   Bensimhon, Daniel,MD 10:24 PM    Echo 08/04/18: Study Conclusions   - Left ventricle: The cavity size was normal. There was severe   concentric hypertrophy. Doppler parameters are consistent with    abnormal left ventricular relaxation (grade 1 diastolic   dysfunction). - Mitral valve: There was mild to moderate regurgitation. - Right ventricle: Systolic function was mildly reduced. - Pericardium, extracardiac: A trivial pericardial effusion was   identified.  Echo 01/28/20: IMPRESSIONS     1. Left ventricular ejection fraction, by estimation, is 50 to 55%. The  left ventricle has low normal function. The left ventricle has no regional  wall motion abnormalities. There is moderate concentric left ventricular  hypertrophy. Left ventricular  diastolic parameters are consistent with Grade I diastolic dysfunction  (impaired relaxation).   2. Right ventricular systolic function is normal. The right ventricular  size is normal.   3. Left atrial size was moderately dilated.   4. The mitral valve is normal in structure. Mild mitral valve  regurgitation. No evidence of mitral stenosis.   5. The aortic valve is tricuspid. Aortic valve regurgitation is not  visualized. No aortic stenosis is present.   6. The inferior vena cava is normal in size with greater than 50%  respiratory variability, suggesting right atrial pressure of 3 mmHg.  Assessment / Plan: 1. Chronic systolic and diastolic CHF with mixed cardiomyopathy. Last EF 50-55%. Volume status looks good.Continue current therapy   2. Coronary disease with remote stenting of the right coronary in 2009 with a drug-eluting stent. Chronic occlusion of OM. He has no significant angina. Myoview study 2017 showed no ischemia.  3. Tobacco abuse. States his is no longer smoking. Continue Symbicort for COPD  4. Hyperlipidemia  - stressed importance of taking statin. Will renew lipitor. Repeat lab in 6 months.  5. COPD with asthma.  6. HTN   7. NSVT s/p ICD. Asymptomatic.  8. S/p ICD/BiV pacemaker. S/p generator change out Nov 2022.     Follow up in 6 months.

## 2021-10-23 ENCOUNTER — Ambulatory Visit (HOSPITAL_COMMUNITY)
Admission: EM | Admit: 2021-10-23 | Discharge: 2021-10-23 | Disposition: A | Discharge status: Home / Self Care | Payer: Self-pay | Attending: Emergency Medicine | Admitting: Emergency Medicine

## 2021-10-23 ENCOUNTER — Encounter (HOSPITAL_COMMUNITY): Payer: Self-pay | Admitting: Emergency Medicine

## 2021-10-23 ENCOUNTER — Other Ambulatory Visit: Payer: Self-pay

## 2021-10-23 DIAGNOSIS — S82832D Other fracture of upper and lower end of left fibula, subsequent encounter for closed fracture with routine healing: Secondary | ICD-10-CM

## 2021-10-23 MED ORDER — MELOXICAM 15 MG PO TABS: 15.0000 mg | ORAL_TABLET | Freq: Every day | ORAL | 0 refills | Status: DC

## 2021-10-23 MED ORDER — OXYCODONE HCL 5 MG PO TABS: 5.0000 mg | ORAL_TABLET | Freq: Four times a day (QID) | ORAL | 0 refills | Status: AC | PRN

## 2021-10-23 NOTE — ED Triage Notes (Signed)
Pt reports was seen here last Monday and still having pain in left ankle/foot and swelling. Reports was told to take Tylenol 1000 mg but not helping with the pain and needs something stronger. Doesn't see ortho until 2/20.

## 2021-10-23 NOTE — Discharge Instructions (Signed)
For your pain  You may take meloxicam every morning with food, this medication will help with pain and swelling  You may continue use of Tylenol in addition as well as icing the area for comfort  You may use oxycodone immediate release every 6 hours as needed, be mindful this medication may make you drowsy, do not take before driving\  You may elevate legs onto a pillow or couch while sitting and lying to help reduce swelling  Please reach out to your primary care doctor or orthopedic doctor to get an earlier appointment for work extension note

## 2021-10-23 NOTE — ED Provider Notes (Signed)
[facility]    CSN: [id number] Arrival date & time: 10/23/21  [id number]      History   Chief Complaint Chief Complaint  Patient presents with   Ankle Pain    HPI [name] is a 81 y.o. male.   Presents with pain and swelling to the lateral aspect of the left lower ankle for 1 week.  Endorses that he had a fall last week and was diagnosed with a fracture, has been wearing cam boot as directed, has upcoming appointment with orthopedic at the end of February. endorses he has been taking Tylenol as recommended and icing ankle which has not provided any relief.  Rating pain a 10 out of 10, described as sharp.  Pain does not radiate.  Denies numbness, tingling.  Able to bear weight but painful.  Requesting stronger pain medication.   Past Medical History:  Diagnosis Date   Acute renal failure ([place]) 05/31/2014   Angina decubitus ([place]) 06/15/2014   CAD (coronary artery disease)    a. s/p stent RCA DES 9/09; b. 2014 Attempted PCI of OM1 @ [facility];  c. 04/2014 Cath: LAD 40-50p, D1 95-99 (chronic), LCX 30-40 inf branch, OM1 CTO, RCA 30-40p, RCA patent stent, EF 35%->Med Rx.   Chronic combined systolic and diastolic CHF (congestive heart failure) (HCC)    a. EF about 40 to 45% per echo in April 2013;  b. 04/2014 Echo: EF 20-25%, sev LVH, sev glob HK, Gr 1 DD, mildly reduced RV fxn, PASP 11mmHg. c. 01/2017: EF improved to 50-55%.    Chronic low back pain    Complete heart block (HCC)    COPD (chronic obstructive pulmonary disease) ([place]) dx 06/2013   PFTs 07/08/13: mod obst with resp to bronchodilator, moderate decrease diffusion, airtrapping   COPD with acute exacerbation ([place]) 01/17/2017   COPD with asthma ([place])    07/08/13 PFT: FEV1 1.74L (66% pred, 30% change with BD), mod obst with resp to bronchodilator, moderate decrease diffusion, air-trapping 11/2013 Simple spiro>> clear obstruction, FEV1 1.30 L (47% pred) - trial of symbicort 160 2bid 01/26/15      Depression 09/15/2018    Assessment: Increased sadness and depression since losing job  Plan: Patient denies suicidal and homicidal ideations Patient would not like to start medication therapies at this time Will establish patient with [facility] for primary care   Dyslipidemia    a. on statin   Essential hypertension    HLD (hyperlipidemia)    HTN (hypertension)    a. Reports intolerance to hydralazine; b. no beta blockers 2/2 bradycardia;  c. failed on ACE and ARB.   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 04/2014 Echo: EF 20-25%, sev glob HK.   Mixed Ischemic/Non-Ischemic Cardiomyopathy    probable mixed ischemic and non-ischemic    Noncompliance    OSA (obstructive sleep apnea) 04/18/2018   06/11/16 - home sleep study shows AHI of 2.9 an hour with the lowest SaO2 of 79% with an average of 93%  03/08/2018-Home sleep study-AHI 7/HR, SaO2 low 81%    Paroxysmal atrial fibrillation ([place])    a. 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   Nocturnal hypoxemia 07/03/2016   Facial abscess 02/04/2016   Dental abscess    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/12/2014   Angina decubitus ([place]) 06/15/2014   Acute renal failure ([place]) 05/31/2014   Sinus bradycardia 05/31/2014   Syncope 05/30/2014   Chronic combined systolic and diastolic CHF (congestive heart failure) (HCC)    Mixed  Ischemic/Non-Ischemic Cardiomyopathy    NSVT (nonsustained ventricular tachycardia)    HLD (hyperlipidemia)    Respiratory arrest ([place]) 05/18/2014   CAD (coronary artery disease) 05/18/2014   Pulmonary edema 05/10/2014   HTN (hypertension) 05/08/2014   LBBB (left bundle branch block) 05/08/2014   Thrombocytopathia ([place]) 05/08/2014   Low back pain 10/05/2013   NSVT (nonsustained ventricular tachycardia) 09/04/2013   Pneumonia 09/02/2013   Flu-like symptoms 09/02/2013   Anemia 09/02/2013   COPD with asthma ([place])    Fatigue 06/18/2013   Nocturia 06/18/2013   Dyspnea 06/18/2013   LV dysfunction 12/13/2011   CAD (coronary artery disease) 10/10/2011   Tobacco abuse 10/10/2011   Dyslipidemia    Essential hypertension    Noncompliance     Past Surgical History:  Procedure Laterality Date   BI-VENTRICULAR IMPLANTABLE CARDIOVERTER DEFIBRILLATOR N/A 08/12/2014   MDT [name] CRTD implanted by Dr Rayann Heman   BIV ICD GENERATOR CHANGEOUT N/A 08/08/2021   Procedure: BIV ICD GENERATOR CHANGEOUT;  Surgeon: Thompson Grayer, MD;  Location: [facility];  Service: Cardiovascular;  Laterality: N/A;   CARDIAC CATHETERIZATION     ejection fraction 50%   LEFT HEART CATHETERIZATION WITH CORONARY ANGIOGRAM N/A 05/08/2014   Procedure: LEFT HEART CATHETERIZATION WITH CORONARY ANGIOGRAM;  Surgeon: Blane Ohara, MD;  Location: [facility];  Service: Cardiovascular;  Laterality: N/A;       Home Medications    Prior to Admission medications   Medication Sig Start Date End Date Taking? Authorizing Provider  acetaminophen (TYLENOL) 650 MG CR tablet Take 650 mg by mouth every 8 (eight) hours as needed for pain.    [provider]  albuterol (PROVENTIL) (2.5 MG/3ML) 0.083% nebulizer solution Take 3 mLs (2.5 mg total) by nebulization every 6 (six) hours as needed for wheezing or shortness of breath. 02/11/18   Lauraine Rinne, NP  albuterol (VENTOLIN HFA) 108 (90 Base) MCG/ACT inhaler Inhale 1  puff into the lungs every 6 (six) hours as needed for wheezing or shortness of breath. 02/07/21   Baldwin Jamaica, PA-C  amLODipine (NORVASC) 5 MG tablet Take 5 mg by mouth in the morning and at bedtime.    [provider]  atorvastatin (LIPITOR) 20 MG tablet Take 1 tablet (20 mg total) by mouth daily. 02/28/21   Martinique, Peter M, MD  budesonide-formoterol [facility]) 160-4.5 MCG/ACT inhaler TAKE 2 PUFFS BY MOUTH TWICE A DAY 09/07/21   Baldwin Jamaica, PA-C  clopidogrel (PLAVIX) 75 MG tablet TAKE 1 TABLET DAILY. PLEASE KEEP APPOINTMENT FOR MORE REFILLS. 09/07/21   Baldwin Jamaica, PA-C  isosorbide mononitrate (IMDUR) 60 MG 24 hr tablet Take 1 tablet (60 mg total) by mouth daily. 01/11/20   [place]ique, Peter M, MD  losartan (COZAAR) 100 MG tablet Take 1 tablet (100 mg total) by mouth daily. 01/11/20   [place], Peter M, MD  metoprolol tartrate (LOPRESSOR) 100 MG tablet Take 1 tablet (100 mg total) by mouth 2 (two) times daily. 02/07/21   Baldwin Jamaica, PA-C  nitroGLYCERIN (NITROSTAT) 0.4 MG SL tablet Place 1 tablet (0.4 mg total) under the tongue every 5 (five) minutes as needed for chest pain. 02/07/21 06/28/23  Baldwin Jamaica, PA-C  potassium chloride SA (K-DUR,KLOR-CON) 20 MEQ tablet Take 40 mEq by mouth daily.    [provider]    Family History Family History  Problem Relation Age of Onset   Heart attack Neg Hx    Stroke Neg Hx     Social History Social History   Tobacco Use   Smoking status: Former    Packs/day: 0.50    Years: 56.00    Pack years: 28.00    Types: Cigarettes    Quit date: 09/10/2012    Years since quitting: 9.1   Smokeless tobacco: Never   Tobacco comments:    tried 1 month ago (05/2018) but didn't like it   Vaping Use   Vaping Use: Never used  Substance Use Topics   Alcohol use: No   Drug use: No     Allergies   Acyclovir and related and Aspirin   Review of Systems Review of Systems  Constitutional: Negative.   Respiratory: Negative.     Cardiovascular: Negative.   Gastrointestinal: Negative.   Musculoskeletal:  Positive for joint swelling. Negative for arthralgias, back pain, gait problem, myalgias, neck pain and neck stiffness.  Skin: Negative.   Neurological: Negative.     Physical Exam Triage Vital Signs ED Triage Vitals [10/23/21 0908]  Enc Vitals Group     BP (!) 191/102     Pulse Rate 82     Resp 18     Temp 98.2 F (36.8 C)     Temp Source Oral     SpO2 96 %     Weight      Height      Head Circumference      Peak Flow      Pain Score 10     Pain Loc      Pain Edu?      Excl. in [place]?    No data found.  Updated Vital Signs BP (!) 191/102 (BP Location: Right Arm)    Pulse 82    Temp 98.2 F (36.8 C) (Oral)    Resp 18    SpO2 96%   Visual Acuity Right Eye Distance:   Left Eye Distance:   Bilateral Distance:    Right Eye Near:   Left Eye Near:    Bilateral Near:     Physical Exam Constitutional:      Appearance: Normal appearance.  HENT:     Head: Normocephalic.  Pulmonary:     Effort: Pulmonary effort is normal.  Musculoskeletal:     Comments: Tenderness along the lateral aspect of the left ankle with moderate swelling, 1+ edema, range of motion intact but elicits pain, no ecchymosis noted, sensation intact, 2+ dorsalis pedis and pedal pulse, cam boot in place  Skin:    General: Skin is warm and dry.  Neurological:     Mental Status: He is alert and oriented to person, place, and time. Mental status is at baseline.  Psychiatric:        Mood and Affect: Mood normal.        Behavior: Behavior normal.     UC Treatments / Results  Labs (all labs ordered are  listed, but only abnormal results are displayed) Labs Reviewed - No data to display  EKG   Radiology No results found.  Procedures Procedures (including critical care time)  Medications Ordered in UC Medications - No data to display  Initial Impression / Assessment and Plan / UC Course  I have reviewed the triage  vital signs and the nursing notes.  Pertinent labs & imaging results that were available during my care of the patient were reviewed by me and considered in my medical decision making (see chart for details).  Closed fracture of distal end of left fibula, subsequent encounter  Patient has been attempting treatment as directed with no success, will prescribe meloxicam 15 mg daily, aspirin allergy listed due to GI upset, recommended patient always take medication with food, prescribed oxycodone IR 5 mg every 6 hours, 20 tablets to be dispensed, PDMP reviewed, low risk, recommended continued use of over-the-counter Tylenol and icing as well as elevation and to continue use of cam boot, advised patient to keep upcoming appointment with orthopedics or attempt to get a earlier appointment to be evaluated further, work note given for 3 days, recommended follow-up with PCP or notifying orthopedics if needing extension Final Clinical Impressions(s) / UC Diagnoses   Final diagnoses:  None   Discharge Instructions   None    ED Prescriptions   None    I have reviewed the PDMP during this encounter.   Hans Eden, [place] 10/23/21 [phone]

## 2021-10-24 ENCOUNTER — Ambulatory Visit: Payer: Self-pay | Admitting: Cardiology

## 2021-10-31 NOTE — Progress Notes (Signed)
[name] Date of Birth: 1941-02-10 Medical Record [id number]  History of Present Illness: [name] is seen for follow up of CAD and CHF. He has multiple medical issues which include HTN,  HLD and known CAD. He has a history of tobacco abuse. He has LV dysfunction with ejection fraction of 40-45% in April 2013.  Not able to take higher beta blocker dose due to his resting bradycardia.  He had a Myoview study in March of 2013 which showed normal perfusion. Ejection fraction of 40%. He was admitted to [facility] in December 2014 with chest pain. Cardiac cath showed patent RCA stent. There was a 50% mid LAD lesion. The first diagonal and first OM were occluded. Attempted PCI of the OM but unable to cross with wire. Review of old films here showed that this was a chronic obstruction. He has a history of NSVT. He was admitted in  August 2015 with VDRF with acute pulmonary edema. Echo showed drop in EF to 20-25%. In September 2015 he was admitted with syncope associated with bradycardia and overdiuresis. Diruetics were reduced and coreg was held. Repeat Echo in November 2015 showed EF of 30%. He did undergo ICD/BiV implant  by Dr. Rayann Heman.  In 2017  he complained of dyspnea on exertion. No clinical evidence of CHF. Echo showed EF 45-50%. Global HK. Mild MR. Myoview study showed a fixed inferolateral infarct without ischemia. EF 39%. He ultimately underwent CPX. This was limited by submaximal exercise and severe hypertensive response. There was mild COPD. Exercise training and improved BP control recommended. Toprol was increased.   Followed regularly in EP clinic for his ICD. Last Echo in 2019 showed EF 45% with moderate LVH  When seen in May 2021 noted more SOB. CXR was clear. BNP 118. Echo showed improved EF to 50-55%. No significant valvular disease.   Seen in the ED on 02/13/21 with dizziness. Patient states he was at work and almost passed out. Evaluation was negative except for urine  culture which grew Staph aureus. Treated with antibiotics. Device check showed brief NSVT that did not correlate with symptoms. He is did undergo generator change out of ICD in November for ERI.   On follow up today he reports he fell on Feb 6 and fractured his left distal fibula. Seen in the ED. Was given pain meds and meloxicam. Notes it is improving but still has some sharp pains. He has noted intermittent symptoms of chest tightness that radiates to his back. It is partially relieved with sl Ntg. Denies smoking. He is taking lipitor now.    Current Outpatient Medications on File Prior to Visit  Medication Sig Dispense Refill   acetaminophen (TYLENOL) 650 MG CR tablet Take 650 mg by mouth every 8 (eight) hours as needed for pain.     albuterol (VENTOLIN HFA) 108 (90 Base) MCG/ACT inhaler Inhale 1 puff into the lungs every 6 (six) hours as needed for wheezing or shortness of breath. 1 each 0   amLODipine (NORVASC) 5 MG tablet Take 5 mg by mouth in the morning and at bedtime.     atorvastatin (LIPITOR) 20 MG tablet Take 1 tablet (20 mg total) by mouth daily. 90 tablet 3   budesonide-formoterol (SYMBICORT) 160-4.5 MCG/ACT inhaler TAKE 2 PUFFS BY MOUTH TWICE A DAY 10.2 each 3   clopidogrel (PLAVIX) 75 MG tablet TAKE 1 TABLET DAILY. PLEASE KEEP APPOINTMENT FOR MORE REFILLS. 90 tablet 3   isosorbide mononitrate (IMDUR) 60 MG 24 hr tablet  Take 1 tablet (60 mg total) by mouth daily. 90 tablet 2   losartan (COZAAR) 100 MG tablet Take 1 tablet (100 mg total) by mouth daily. 90 tablet 3   meloxicam (MOBIC) 15 MG tablet Take 1 tablet (15 mg total) by mouth daily. 30 tablet 0   metoprolol tartrate (LOPRESSOR) 100 MG tablet Take 1 tablet (100 mg total) by mouth 2 (two) times daily. 180 tablet 1   nitroGLYCERIN (NITROSTAT) 0.4 MG SL tablet Place 1 tablet (0.4 mg total) under the tongue every 5 (five) minutes as needed for chest pain. 25 tablet 3   potassium chloride SA (K-DUR,KLOR-CON) 20 MEQ tablet Take 40  mEq by mouth daily.     No current facility-administered medications on file prior to visit.    Allergies  Allergen Reactions   Acyclovir And Related     GI upset    Aspirin Other (See Comments)    GI upset at high doses only.    Past Medical History:  Diagnosis Date   Acute renal failure ([place]) 05/31/2014   Angina decubitus ([place]) 06/15/2014   CAD (coronary artery disease)    a. s/p stent RCA DES 9/09; b. 2014 Attempted PCI of OM1 @ [place];  c. 04/2014 Cath: LAD 40-50p, D1 95-99 (chronic), LCX 30-40 inf branch, OM1 CTO, RCA 30-40p, RCA patent stent, EF 35%->Med Rx.   Chronic combined systolic and diastolic CHF (congestive heart failure) (HCC)    a. EF about 40 to 45% per echo in April 2013;  b. 04/2014 Echo: EF 20-25%, sev LVH, sev glob HK, Gr 1 DD, mildly reduced RV fxn, PASP 18mmHg. c. 01/2017: EF improved to 50-55%.    Chronic low back pain    Complete heart block (HCC)    COPD (chronic obstructive pulmonary disease) ([place]) dx 06/2013   PFTs 07/08/13: mod obst with resp to bronchodilator, moderate decrease diffusion, airtrapping   COPD with acute exacerbation ([place]) 01/17/2017   COPD with asthma ([place])    07/08/13 PFT: FEV1 1.74L (66% pred, 30% change with BD), mod obst with resp to bronchodilator, moderate decrease diffusion, air-trapping 11/2013 Simple spiro>> clear obstruction, FEV1 1.30 L (47% pred) - trial of symbicort 160 2bid 01/26/15      Depression 09/15/2018   Assessment: Increased sadness and depression since losing job  Plan: Patient denies suicidal and homicidal ideations Patient would not like to start medication therapies at this time Will establish patient with [facility] for primary care   Dyslipidemia    a. on statin   Essential hypertension    HLD (hyperlipidemia)    HTN (hypertension)    a. Reports intolerance to hydralazine; b. no beta blockers 2/2 bradycardia;  c. failed on ACE and ARB.   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 04/2014 Echo: EF 20-25%, sev glob HK.   Mixed Ischemic/Non-Ischemic Cardiomyopathy    probable mixed ischemic and non-ischemic    Noncompliance    OSA (obstructive sleep apnea) 04/18/2018   06/11/16 - home sleep study shows AHI of 2.9 an hour with the lowest SaO2 of 79% with an average of 93%  03/08/2018-Home sleep study-AHI 7/HR, SaO2 low 81%    Paroxysmal atrial fibrillation ([place])    a. identified on device interrogation 01/2016   Pneumonia 09/02/2013   Pulmonary edema 05/10/2014   Respiratory arrest ([place])  05/18/2014   Sinus bradycardia 05/31/2014   Ventricular tachycardia    treated with ATP,  CL 250-300 msec    Past Surgical History:  Procedure Laterality Date   BI-VENTRICULAR IMPLANTABLE CARDIOVERTER DEFIBRILLATOR N/A 08/12/2014   MDT [name] CRTD implanted by Dr Rayann Heman   BIV ICD GENERATOR CHANGEOUT N/A 08/08/2021   Procedure: BIV ICD GENERATOR CHANGEOUT;  Surgeon: Thompson Grayer, MD;  Location: [facility];  Service: Cardiovascular;  Laterality: N/A;   CARDIAC CATHETERIZATION     ejection fraction 50%   LEFT HEART CATHETERIZATION WITH CORONARY ANGIOGRAM N/A 05/08/2014   Procedure: LEFT HEART CATHETERIZATION WITH CORONARY ANGIOGRAM;  Surgeon: Blane Ohara, MD;  Location[facility] CATH LAB;  Service: Cardiovascular;  Laterality: N/A;    Social History   Tobacco Use  Smoking Status Former   Packs/day: 0.50   Years: 56.00   Pack years: 28.00   Types: Cigarettes   Quit date: 09/10/2012   Years since quitting: 9.1  Smokeless Tobacco Never  Tobacco Comments   tried 1 month ago (05/2018) but didn't like it     Social History   Substance and Sexual Activity  Alcohol Use No    Family History  Problem Relation Age of Onset   Heart attack Neg Hx    Stroke Neg Hx     Review of Systems: As noted in history of present  illness  All other systems were reviewed and are negative.  Physical Exam: BP (!) 150/90    Pulse 68    Ht 5' 8.5" (1.74 m)    Wt 148 lb 6.4 oz (67.3 kg)    BMI 22.24 kg/m  GENERAL:  Well appearing BM in NAD HEENT:  PERRL, EOMI, sclera are clear. Oropharynx is clear. NECK:  No jugular venous distention, carotid upstroke brisk and symmetric, no bruits, no thyromegaly or adenopathy LUNGS:  Clear to auscultation bilaterally CHEST:  Unremarkable HEART:  RRR,  PMI not displaced or sustained,S1 and S2 within normal limits, no S3, no S4: no clicks, no rubs, no murmurs ABD:  Soft, nontender. BS +, no masses or bruits. No hepatomegaly, no splenomegaly EXT:  2 + pulses throughout, no edema, no cyanosis no clubbing SKIN:  Warm and dry.  No rashes NEURO:  Alert and oriented x 3. Cranial nerves II through XII intact. PSYCH:  Cognitively intact     LABORATORY DATA:   Lab Results  Component Value Date   WBC 6.5 07/21/2021   HGB 12.0 (L) 07/21/2021   HCT 37.4 (L) 07/21/2021   PLT 179 07/21/2021   GLUCOSE 99 07/21/2021   CHOL 212 (H) 02/20/2021   TRIG 80 02/20/2021   HDL 65 02/20/2021   LDLDIRECT 132.9 06/18/2013   LDLCALC 133 (H) 02/20/2021   ALT 14 02/20/2021   AST 18 02/20/2021   NA 144 07/21/2021   K 3.8 07/21/2021   CL 104 07/21/2021   CREATININE 0.90 07/21/2021   BUN 11 07/21/2021   CO2 25 07/21/2021   TSH 2.67 01/31/2016   INR 1.09 06/20/2014     Echo: 02/20/16:Study Conclusions   - Left ventricle: There was moderate concentric hypertrophy.   Systolic function was mildly reduced. The estimated ejection   fraction was in the range of 45% to 50%. Mild diffuse hypokinesis   with no identifiable regional variations. Abnormal relaxation   with increased filling pressures. - Ventricular septum: Septal motion showed paradox. These changes   are consistent with right ventricular pacing. - Mitral valve: There was  mild regurgitation  Myoview 02/20/16: Study Highlights   Nuclear  stress EF: 39%. Diffuse hypokinesis with asynchronous contraction consistent with pacemaker There was no ST segment deviation noted during stress. Defect 1: There is a small defect of moderate severity present in the mid inferolateral location. Question possible focal lateral infarction. Findings consistent with prior myocardial infarction. No areas of ischemia identified. This is an intermediate risk study.   Candee Furbish, MD   CPX: 07/05/16: Narrative   Referred for: Exertional Dyspnea   Procedure: This patient underwent staged symptom-limited exercise treadmill testing using a modified [place] protocol with expired gas analysis metabolic evaluation during exercise.  Demographics  Age: 81 Ht. (in.) 68.5 Wt. (lb) 151 BMI: 22.6      Predicted Peak VO2: 26.6  Gender: Male Ht (cm) 174 Wt. (kg) 68.5    Results  Pre-Exercise PFTs   FVC 2.70 (81%)       FEV1 1.64 (64%)         FEV1/FVC 61 (79%)         MVV 70 (58%)         Exercise Time:    9:06   Speed (mph): 2.0       Grade (%): 10.5      RPE: 17  Reason stopped: Test was ended due to hypertensive response achieving termination criteria prior to maximal exercise.  Additional symptoms: bilateral hip pain (5/10) dyspnea (3/10)  Resting HR: 65 Peak HR: 127   (86% age predicted max HR)  BP rest: 180/66 BP peak: 246/94  Peak VO2: 17.5 (66% predicted peak VO2)  VE/VCO2 slope:  33.4  OUES: 1.94  Peak RER: 0.98  Ventilatory Threshold: 16.5 (62% predicted or measured peak VO2)  Peak RR 34  Peak Ventilation:  44.3  VE/MVV:  63%  PETCO2 at peak:  31  O2pulse:  9   (75% predicted O2pulse)   Interpretation  Notes: Patient gave a very good effort. Pulse-oximetry remained 96% or above for the duration of exercise.   ECG:  Resting ECG AV paced rhythm. HR response appropriate. PVCs occasional. There were no sustained arrhythmias and no ST-T changes. BP hypertensive at rest with increasingly significant hypertensive  response to incremental exercise.  PFT:  Pre-exercise spirometry suggests possible mild restrictive/obstructive patterns. MVV below normal.   CPX:  Exercise testing with gas exchange demonstrates a moderately reduced peak VO2 of 17.5 ml/kg/min (66% of the age/gender/weight matched sedentary norms). The RER of 0.98 indicates a submaximal effort. The VE/VCO2 slope is mildly elevated and indicates increased dead space ventiltion. The oxygen uptake efficiency slope (OUES) is moderately reduced. The VO2 at the ventilatory threshold was normal at 62% of the predicted peak VO2. At peak exercise, the ventilation reached 63% of the measured MVV indicating ventilatory reserve remained. The O2pulse (a surrogate for stroke volume) increased with initial exercise but remained flat at 9 ml/beeat (75% predicted).   Conclusion: The interpretation of this test is limited due to submaximal effort during the exercise. Based on available data, exercise testing with gas exchange demonstrates moderate functional impairment. Pre-exercise spirometry suggests mild obstructive pattern. With significantly hypertensive response to exercise, flat O2 pulse and elevated VE/VCO2 slope patient is most likely limited due to diastolic dysfunction and possible early pulmonary vascular disease.    Test, report and preliminary impression by: Landis Martins, MS, ACSM-RCEP 07/05/2016 9:46 AM  Preliminary CPX Results, Finalized results will be forwarded when completed by interpreting physician.   Agree with above. This is submax test with  mild functional limitation that is multifactorial in nature including COPD, deconditioning and severe hypertensive response with mild circulatory limitation.  Would consider exercise training program and tighter BP control.   Bensimhon, Daniel,MD 10:24 PM    Echo 08/04/18: Study Conclusions   - Left ventricle: The cavity size was normal. There was severe   concentric hypertrophy. Doppler parameters  are consistent with   abnormal left ventricular relaxation (grade 1 diastolic   dysfunction). - Mitral valve: There was mild to moderate regurgitation. - Right ventricle: Systolic function was mildly reduced. - Pericardium, extracardiac: A trivial pericardial effusion was   identified.  Echo 01/28/20: IMPRESSIONS     1. Left ventricular ejection fraction, by estimation, is 50 to 55%. The  left ventricle has low normal function. The left ventricle has no regional  wall motion abnormalities. There is moderate concentric left ventricular  hypertrophy. Left ventricular  diastolic parameters are consistent with Grade I diastolic dysfunction  (impaired relaxation).   2. Right ventricular systolic function is normal. The right ventricular  size is normal.   3. Left atrial size was moderately dilated.   4. The mitral valve is normal in structure. Mild mitral valve  regurgitation. No evidence of mitral stenosis.   5. The aortic valve is tricuspid. Aortic valve regurgitation is not  visualized. No aortic stenosis is present.   6. The inferior vena cava is normal in size with greater than 50%  respiratory variability, suggesting right atrial pressure of 3 mmHg.  Assessment / Plan: 1. Chronic systolic and diastolic CHF with mixed cardiomyopathy. Last EF 50-55%. Volume status looks good.Continue current therapy   2. Coronary disease with remote stenting of the right coronary in 2009 with a drug-eluting stent. Chronic occlusion of OM. He is having some chest tightness concerning for angina. Relief with Ntg. Will arrange for [facility] study .   3. Tobacco abuse. States his is no longer smoking. Continue Symbicort for COPD  4. Hyperlipidemia  - reports compliance with statin. Will update labs.   5. COPD with asthma.  6. HTN   7. NSVT s/p ICD. Asymptomatic.  8. S/p ICD/BiV pacemaker. S/p generator change out Nov 2022.   9. Distal fibula fracture. He is asking for additional  oxycontin. Will need to check with Ortho or primary care for this.     Follow up in 6 months.

## 2021-11-07 ENCOUNTER — Encounter: Payer: Self-pay | Admitting: Cardiology

## 2021-11-07 ENCOUNTER — Other Ambulatory Visit: Payer: Self-pay

## 2021-11-07 ENCOUNTER — Ambulatory Visit (INDEPENDENT_AMBULATORY_CARE_PROVIDER_SITE_OTHER): Payer: Self-pay | Admitting: Cardiology

## 2021-11-07 VITALS — BP 150/90 | HR 68 | Ht 68.5 in | Wt 148.4 lb

## 2021-11-07 DIAGNOSIS — I255 Ischemic cardiomyopathy: Secondary | ICD-10-CM

## 2021-11-07 DIAGNOSIS — I5022 Chronic systolic (congestive) heart failure: Secondary | ICD-10-CM

## 2021-11-07 DIAGNOSIS — Z95810 Presence of automatic (implantable) cardiac defibrillator: Secondary | ICD-10-CM

## 2021-11-07 DIAGNOSIS — I447 Left bundle-branch block, unspecified: Secondary | ICD-10-CM

## 2021-11-07 DIAGNOSIS — I4729 Other ventricular tachycardia: Secondary | ICD-10-CM

## 2021-11-07 DIAGNOSIS — I25118 Atherosclerotic heart disease of native coronary artery with other forms of angina pectoris: Secondary | ICD-10-CM

## 2021-11-07 NOTE — Addendum Note (Signed)
Addended by: Neoma Laming on: 11/07/2021 02:56 PM   Modules accepted: Orders

## 2021-11-07 NOTE — Patient Instructions (Signed)
Medication Instructions:  Continue same medications *If you need a refill on your cardiac medications before your next appointment, please call your pharmacy*   Lab Work: Have lab work bmet,lipid and hepatic panels done the morning of Lexiscan   Testing/Procedures: Lexiscan Myoview   Follow-Up: At [facility], you and your health needs are our priority.  As part of our continuing mission to provide you with exceptional heart care, we have created designated Provider Care Teams.  These Care Teams include your primary Cardiologist (physician) and Advanced Practice Providers (APPs -  Physician Assistants and Nurse Practitioners) who all work together to provide you with the care you need, when you need it.  We recommend signing up for the patient portal called "MyChart".  Sign up information is provided on this After Visit Summary.  MyChart is used to connect with patients for Virtual Visits (Telemedicine).  Patients are able to view lab/test results, encounter notes, upcoming appointments, etc.  Non-urgent messages can be sent to your provider as well.   To learn more about what you can do with MyChart, go to [email or web address].    Your next appointment: To Be Determined after test    The format for your next appointment:  Office    Provider:  Dr.Jordan

## 2021-11-08 ENCOUNTER — Telehealth: Payer: Self-pay | Admitting: Physician Assistant

## 2021-11-08 ENCOUNTER — Other Ambulatory Visit: Payer: Self-pay | Admitting: Cardiology

## 2021-11-08 ENCOUNTER — Encounter: Payer: Self-pay | Admitting: Cardiology

## 2021-11-08 MED ORDER — CLOPIDOGREL BISULFATE 75 MG PO TABS: ORAL_TABLET | ORAL | 3 refills | Status: DC

## 2021-11-08 NOTE — Telephone Encounter (Signed)
?*  STAT* If patient is at the pharmacy, call can be transferred to refill team. ? ? ?1. Which medications need to be refilled? (please list name of each medication and dose if known) clopidogrel (PLAVIX) 75 MG tablet ? ?2. Which pharmacy/location (including street and city if local pharmacy) is medication to be sent to? [facility] -  ([place]), [place] - [address] ? ?3. Do they need a 30 day or 90 day supply? 30 day ? ? ?Patient is completely out of medication. ?

## 2021-11-08 NOTE — Telephone Encounter (Signed)
error 

## 2021-11-08 NOTE — Telephone Encounter (Signed)
Pt's medication was sent to pt's pharmacy as requested. Confirmation received.  °

## 2021-11-08 NOTE — Addendum Note (Signed)
Addended by: Margaret Pyle D on: 11/08/2021 11:19 AM ? ? Modules accepted: Orders ? ?

## 2021-11-10 ENCOUNTER — Ambulatory Visit (INDEPENDENT_AMBULATORY_CARE_PROVIDER_SITE_OTHER): Payer: Self-pay

## 2021-11-10 DIAGNOSIS — I4729 Other ventricular tachycardia: Secondary | ICD-10-CM

## 2021-11-13 ENCOUNTER — Other Ambulatory Visit: Payer: Self-pay

## 2021-11-13 ENCOUNTER — Encounter: Payer: Self-pay | Admitting: Nurse Practitioner

## 2021-11-13 ENCOUNTER — Ambulatory Visit: Payer: MEDICAID | Attending: Nurse Practitioner | Admitting: Nurse Practitioner

## 2021-11-13 VITALS — BP 175/87 | HR 68 | Resp 18 | Ht 68.5 in | Wt 146.1 lb

## 2021-11-13 DIAGNOSIS — M25572 Pain in left ankle and joints of left foot: Secondary | ICD-10-CM

## 2021-11-13 DIAGNOSIS — D649 Anemia, unspecified: Secondary | ICD-10-CM

## 2021-11-13 DIAGNOSIS — J449 Chronic obstructive pulmonary disease, unspecified: Secondary | ICD-10-CM

## 2021-11-13 DIAGNOSIS — I10 Essential (primary) hypertension: Secondary | ICD-10-CM

## 2021-11-13 DIAGNOSIS — Z1159 Encounter for screening for other viral diseases: Secondary | ICD-10-CM

## 2021-11-13 DIAGNOSIS — Z7689 Persons encountering health services in other specified circumstances: Secondary | ICD-10-CM

## 2021-11-13 LAB — CUP PACEART REMOTE DEVICE CHECK
Battery Remaining Longevity: 71 mo
Battery Voltage: 3.03 V
Brady Statistic AP VP Percent: 58.49 %
Brady Statistic AP VS Percent: 0.06 %
Brady Statistic AS VP Percent: 40.48 %
Brady Statistic AS VS Percent: 0.97 %
Brady Statistic RA Percent Paced: 56.41 %
Brady Statistic RV Percent Paced: 98 %
Date Time Interrogation Session: 20230303213629
HighPow Impedance: 63 Ohm
Implantable Lead Implant Date: 20151203
Implantable Lead Implant Date: 20151203
Implantable Lead Implant Date: 20151203
Implantable Lead Location: 753858
Implantable Lead Location: 753859
Implantable Lead Location: 753860
Implantable Lead Model: 4598
Implantable Lead Model: 5076
Implantable Lead Model: 6935
Implantable Pulse Generator Implant Date: 20221129
Lead Channel Impedance Value: 160.941
Lead Channel Impedance Value: 160.941
Lead Channel Impedance Value: 160.941
Lead Channel Impedance Value: 171 Ohm
Lead Channel Impedance Value: 171 Ohm
Lead Channel Impedance Value: 247 Ohm
Lead Channel Impedance Value: 304 Ohm
Lead Channel Impedance Value: 304 Ohm
Lead Channel Impedance Value: 342 Ohm
Lead Channel Impedance Value: 342 Ohm
Lead Channel Impedance Value: 342 Ohm
Lead Channel Impedance Value: 361 Ohm
Lead Channel Impedance Value: 399 Ohm
Lead Channel Impedance Value: 513 Ohm
Lead Channel Impedance Value: 551 Ohm
Lead Channel Impedance Value: 551 Ohm
Lead Channel Impedance Value: 551 Ohm
Lead Channel Impedance Value: 589 Ohm
Lead Channel Pacing Threshold Amplitude: 0.75 V
Lead Channel Pacing Threshold Amplitude: 1 V
Lead Channel Pacing Threshold Amplitude: 1.625 V
Lead Channel Pacing Threshold Pulse Width: 0.4 ms
Lead Channel Pacing Threshold Pulse Width: 0.4 ms
Lead Channel Pacing Threshold Pulse Width: 0.4 ms
Lead Channel Sensing Intrinsic Amplitude: 0.625 mV
Lead Channel Sensing Intrinsic Amplitude: 0.625 mV
Lead Channel Sensing Intrinsic Amplitude: 24 mV
Lead Channel Sensing Intrinsic Amplitude: 24 mV
Lead Channel Setting Pacing Amplitude: 1.5 V
Lead Channel Setting Pacing Amplitude: 2 V
Lead Channel Setting Pacing Amplitude: 3.25 V
Lead Channel Setting Pacing Pulse Width: 0.4 ms
Lead Channel Setting Pacing Pulse Width: 0.4 ms
Lead Channel Setting Sensing Sensitivity: 0.3 mV

## 2021-11-13 MED ORDER — DICLOFENAC SODIUM 1 % EX GEL
2.0000 g | Freq: Four times a day (QID) | CUTANEOUS | 1 refills | Status: AC
  Filled 2021-11-13: qty 200, 25d supply, fill #0
  Filled 2021-12-08: qty 100, 13d supply, fill #1

## 2021-11-13 MED ORDER — ALBUTEROL SULFATE HFA 108 (90 BASE) MCG/ACT IN AERS
1.0000 | INHALATION_SPRAY | Freq: Four times a day (QID) | RESPIRATORY_TRACT | 1 refills | Status: DC | PRN
Start: 2021-11-13 — End: 2023-04-04
  Filled 2021-11-13: qty 18, 25d supply, fill #0

## 2021-11-13 MED ORDER — ISOSORBIDE MONONITRATE ER 30 MG PO TB24
30.0000 mg | ORAL_TABLET | Freq: Every day | ORAL | 1 refills | Status: DC
  Filled 2021-11-13: qty 90, 90d supply, fill #0

## 2021-11-13 MED ORDER — ALBUTEROL SULFATE HFA 108 (90 BASE) MCG/ACT IN AERS
1.0000 | INHALATION_SPRAY | Freq: Four times a day (QID) | RESPIRATORY_TRACT | 1 refills | Status: DC | PRN
Start: 2021-11-13 — End: 2021-11-13
  Filled 2021-11-13: qty 1, fill #0

## 2021-11-13 MED ORDER — BUDESONIDE-FORMOTEROL FUMARATE 160-4.5 MCG/ACT IN AERO
INHALATION_SPRAY | RESPIRATORY_TRACT | 3 refills | Status: DC
  Filled 2021-11-13: qty 10.2, 28d supply, fill #0
  Filled 2021-12-25: qty 10.2, 28d supply, fill #1
  Filled 2022-03-12: qty 10.2, 28d supply, fill #2

## 2021-11-13 NOTE — Progress Notes (Signed)
Assessment & Plan:  [name] was seen today for establish care.  Diagnoses and all orders for this visit:  Encounter to establish care  Essential hypertension -     isosorbide mononitrate (IMDUR) 30 MG 24 hr tablet; Take 1 tablet (30 mg total) by mouth daily.  Anemia, unspecified type -     CBC with Differential  COPD with asthma ([place]) -     Discontinue: albuterol (VENTOLIN HFA) 108 (90 Base) MCG/ACT inhaler; Inhale 1-2 puffs into the lungs every 6 (six) hours as needed for wheezing or shortness of breath. -     budesonide-formoterol (SYMBICORT) 160-4.5 MCG/ACT inhaler; INHALE 2 PUFFS BY MOUTH TWICE A DAY -     albuterol (VENTOLIN HFA) 108 (90 Base) MCG/ACT inhaler; Inhale 1-2 puffs into the lungs every 6 (six) hours as needed for wheezing or shortness of breath.  Need for hepatitis C screening test -     HCV Ab w Reflex to Quant PCR -     Interpretation:  Acute left ankle pain -     diclofenac Sodium (VOLTAREN) 1 % GEL; Apply 2 g topically 4 (four) times daily.    Patient has been counseled on age-appropriate routine health concerns for screening and prevention. These are reviewed and up-to-date. Referrals have been placed accordingly. Immunizations are up-to-date or declined.    Subjective:   Chief Complaint  Patient presents with   Establish Care   HPI [name] 81 y.o. male presents to office today to establish care. He has a history of CAD, CHF, HTN, HLD, FORMER Tobacco abuse with COPD, ICD/BiV implant (EF 50%), OSA, left ankle  nondisplaced fracture of the left fibula (followed by Ortho).   He is followed by Cardiology.    HTN Currently prescribed amlodipine 5 mg BID, imdur 60 mg daily, losartan 100 mg daily and lopressor 100 mg BID. I am increasing imdur to 90mg  daily today. He will take an additional 30 mg tablet.  BP Readings from Last 3 Encounters:  11/13/21 (!) 175/87  11/07/21 (!) 150/90  10/23/21 (!) 191/102     COPD Symptoms well controlled with  Symbicort and prn albuterol.   Review of Systems  Constitutional:  Negative for fever, malaise/fatigue and weight loss.  HENT: Negative.  Negative for nosebleeds.   Eyes: Negative.  Negative for blurred vision, double vision and photophobia.  Respiratory: Negative.  Negative for cough and shortness of breath.   Cardiovascular: Negative.  Negative for chest pain, palpitations and leg swelling.  Gastrointestinal: Negative.  Negative for heartburn, nausea and vomiting.  Musculoskeletal:  Positive for joint pain (left ankle). Negative for myalgias.  Neurological: Negative.  Negative for dizziness, focal weakness, seizures and headaches.  Psychiatric/Behavioral: Negative.  Negative for suicidal ideas.    Past Medical History:  Diagnosis Date   Acute renal failure ([place]) 05/31/2014   Angina decubitus ([place]) 06/15/2014   CAD (coronary artery disease)    a. s/p stent RCA DES 9/09; b. 2014 Attempted PCI of OM1 @ [place];  c. 04/2014 Cath: LAD 40-50p, D1 95-99 (chronic), LCX 30-40 inf branch, OM1 CTO, RCA 30-40p, RCA patent stent, EF 35%->Med Rx.   Chronic combined systolic and diastolic CHF (congestive heart failure) (HCC)    a. EF about 40 to 45% per echo in April 2013;  b. 04/2014 Echo: EF 20-25%, sev LVH, sev glob HK, Gr 1 DD, mildly reduced RV fxn, PASP 71mmHg. c. 01/2017: EF improved to 50-55%.    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[facility])    COPD (chronic obstructive pulmonary disease) ([place]) dx 06/2013   PFTs 07/08/13: mod obst with resp to bronchodilator, moderate decrease diffusion, airtrapping   COPD with acute exacerbation ([place]) 01/17/2017   COPD with asthma ([place])    07/08/13 PFT: FEV1 1.74L (66% pred, 30% change with BD), mod obst with resp to bronchodilator, moderate decrease diffusion, air-trapping 11/2013 Simple spiro>> clear obstruction, FEV1 1.30 L (47% pred) - trial of symbicort 160 2bid 01/26/15      Depression 09/15/2018   Assessment: Increased sadness and depression  since losing job  Plan: Patient denies suicidal and homicidal ideations Patient would not like to start medication therapies at this time Will establish patient with [facility] for primary care   Dyslipidemia    a. on statin   Essential hypertension    HLD (hyperlipidemia)    HTN (hypertension)    a. Reports intolerance to hydralazine; b. no beta blockers 2/2 bradycardia;  c. failed on ACE and ARB.   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 04/2014 Echo: EF 20-25%, sev glob HK.   Mixed Ischemic/Non-Ischemic Cardiomyopathy    probable mixed ischemic and non-ischemic    Noncompliance    OSA (obstructive sleep apnea) 04/18/2018   06/11/16 - home sleep study shows AHI of 2.9 an hour with the lowest SaO2 of 79% with an average of 93%  03/08/2018-Home sleep study-AHI 7/HR, SaO2 low 81%    Paroxysmal atrial fibrillation ([place])    a. identified on device interrogation 01/2016   Pneumonia 09/02/2013   Pulmonary edema 05/10/2014   Respiratory arrest ([place]) 05/18/2014   Sinus bradycardia 05/31/2014   Ventricular tachycardia    treated with ATP,  CL 250-300 msec    Past Surgical History:  Procedure Laterality Date   BI-VENTRICULAR IMPLANTABLE CARDIOVERTER DEFIBRILLATOR N/A 08/12/2014   MDT [name] CRTD implanted by Dr Rayann Heman   BIV ICD GENERATOR CHANGEOUT N/A 08/08/2021   Procedure: BIV ICD GENERATOR CHANGEOUT;  Surgeon: Thompson Grayer, MD;  Location: [facility];  Service: Cardiovascular;  Laterality: N/A;   CARDIAC CATHETERIZATION     ejection fraction 50%   LEFT HEART CATHETERIZATION WITH CORONARY ANGIOGRAM N/A 05/08/2014   Procedure: LEFT HEART CATHETERIZATION WITH CORONARY ANGIOGRAM;  Surgeon: Blane Ohara, MD;  Location: [facility] CATH LAB;  Service: Cardiovascular;  Laterality: N/A;    Family History  Problem  Relation Age of Onset   Heart attack Neg Hx    Stroke Neg Hx     Social History Reviewed with no changes to be made today.   Outpatient Medications Prior to Visit  Medication Sig Dispense Refill   acetaminophen (TYLENOL) 650 MG CR tablet Take 650 mg by mouth every 8 (eight) hours as needed for pain.     amLODipine (NORVASC) 5 MG tablet Take 5 mg by mouth in the morning and at bedtime.     atorvastatin (LIPITOR) 20 MG tablet Take 1 tablet (20 mg total) by mouth daily. 90 tablet 3   clopidogrel (PLAVIX) 75 MG tablet TAKE 1 TABLET DAILY. 90 tablet 3   isosorbide mononitrate (IMDUR) 60 MG 24 hr tablet Take 1 tablet (60 mg total) by mouth daily. 90 tablet 2   losartan (COZAAR) 100 MG tablet Take 1 tablet (100 mg total) by mouth daily. 90 tablet 3  meloxicam (MOBIC) 15 MG tablet Take 1 tablet (15 mg total) by mouth daily. 30 tablet 0   metoprolol tartrate (LOPRESSOR) 100 MG tablet Take 1 tablet (100 mg total) by mouth 2 (two) times daily. 180 tablet 1   nitroGLYCERIN (NITROSTAT) 0.4 MG SL tablet Place 1 tablet (0.4 mg total) under the tongue every 5 (five) minutes as needed for chest pain. 25 tablet 3   potassium chloride SA (K-DUR,KLOR-CON) 20 MEQ tablet Take 40 mEq by mouth daily.     albuterol (VENTOLIN HFA) 108 (90 Base) MCG/ACT inhaler Inhale 1 puff into the lungs every 6 (six) hours as needed for wheezing or shortness of breath. 1 each 0   budesonide-formoterol (SYMBICORT) 160-4.5 MCG/ACT inhaler TAKE 2 PUFFS BY MOUTH TWICE A DAY 10.2 each 3   No facility-administered medications prior to visit.    Allergies  Allergen Reactions   Acyclovir And Related     GI upset    Aspirin Other (See Comments)    GI upset at high doses only.       Objective:    BP (!) 175/87    Pulse 68    Resp 18    Ht 5' 8.5" (1.74 m)    Wt 146 lb 2 oz (66.3 kg)    SpO2 99%    BMI 21.90 kg/m  Wt Readings from Last 3 Encounters:  11/13/21 146 lb 2 oz (66.3 kg)  11/07/21 148 lb 6.4 oz (67.3 kg)  09/07/21  148 lb (67.1 kg)    Physical Exam Vitals and nursing note reviewed.  Constitutional:      Appearance: He is well-developed.  HENT:     Head: Normocephalic and atraumatic.  Cardiovascular:     Rate and Rhythm: Normal rate and regular rhythm.     Heart sounds: Normal heart sounds. No murmur heard.   No friction rub. No gallop.  Pulmonary:     Effort: Pulmonary effort is normal. No tachypnea or respiratory distress.     Breath sounds: Normal breath sounds. No decreased breath sounds, wheezing, rhonchi or rales.  Chest:     Chest wall: No tenderness.  Abdominal:     General: Bowel sounds are normal.     Palpations: Abdomen is soft.  Musculoskeletal:        General: Normal range of motion.     Cervical back: Normal range of motion.  Skin:    General: Skin is warm and dry.  Neurological:     Mental Status: He is alert and oriented to person, place, and time.     Coordination: Coordination normal.  Psychiatric:        Behavior: Behavior normal. Behavior is cooperative.        Thought Content: Thought content normal.        Judgment: Judgment normal.         Patient has been counseled extensively about nutrition and exercise as well as the importance of adherence with medications and regular follow-up. The patient was given clear instructions to go to ER or return to medical center if symptoms don't improve, worsen or new problems develop. The patient verbalized understanding.   Follow-up: Return for 2 week f/u with [facility] BP CHECK. See me in 3 months.   Gildardo Pounds, FNP-BC [facility], [place]   11/17/2021, 9:59 PM

## 2021-11-14 LAB — HCV INTERPRETATION

## 2021-11-14 LAB — CBC WITH DIFFERENTIAL/PLATELET
Basophils Absolute: 0 10*3/uL (ref 0.0–0.2)
Basos: 1 %
EOS (ABSOLUTE): 0.1 10*3/uL (ref 0.0–0.4)
Eos: 1 %
Hematocrit: 37.8 % (ref 37.5–51.0)
Hemoglobin: 12.3 g/dL — ABNORMAL LOW (ref 13.0–17.7)
Immature Grans (Abs): 0 10*3/uL (ref 0.0–0.1)
Immature Granulocytes: 0 %
Lymphocytes Absolute: 3 10*3/uL (ref 0.7–3.1)
Lymphs: 48 %
MCH: 28 pg (ref 26.6–33.0)
MCHC: 32.5 g/dL (ref 31.5–35.7)
MCV: 86 fL (ref 79–97)
Monocytes Absolute: 0.6 10*3/uL (ref 0.1–0.9)
Monocytes: 9 %
Neutrophils Absolute: 2.6 10*3/uL (ref 1.4–7.0)
Neutrophils: 41 %
Platelets: 171 10*3/uL (ref 150–450)
RBC: 4.39 x10E6/uL (ref 4.14–5.80)
RDW: 13 % (ref 11.6–15.4)
WBC: 6.2 10*3/uL (ref 3.4–10.8)

## 2021-11-14 LAB — HCV AB W REFLEX TO QUANT PCR: HCV Ab: NONREACTIVE

## 2021-11-16 ENCOUNTER — Ambulatory Visit (HOSPITAL_COMMUNITY)
Admission: RE | Admit: 2021-11-16 | Payer: MEDICAID | Source: Ambulatory Visit | Attending: Cardiology | Admitting: Cardiology

## 2021-11-17 ENCOUNTER — Encounter: Payer: Self-pay | Admitting: Nurse Practitioner

## 2021-11-20 ENCOUNTER — Telehealth: Payer: Self-pay

## 2021-11-20 NOTE — Telephone Encounter (Signed)
Left message to return call to our office.  

## 2021-11-20 NOTE — Progress Notes (Signed)
Remote ICD transmission.   

## 2021-11-21 ENCOUNTER — Telehealth: Payer: Self-pay

## 2021-11-21 ENCOUNTER — Telehealth (HOSPITAL_COMMUNITY): Payer: Self-pay | Admitting: *Deleted

## 2021-11-21 NOTE — Telephone Encounter (Signed)
Left message to return call to our office.  

## 2021-11-21 NOTE — Telephone Encounter (Signed)
Close encounter 

## 2021-11-22 ENCOUNTER — Other Ambulatory Visit: Payer: Self-pay

## 2021-11-22 ENCOUNTER — Ambulatory Visit (HOSPITAL_COMMUNITY)
Admission: RE | Admit: 2021-11-22 | Discharge: 2021-11-22 | Disposition: A | Discharge status: Home / Self Care | Payer: MEDICAID | Source: Ambulatory Visit | Attending: Cardiology | Admitting: Cardiology

## 2021-11-22 DIAGNOSIS — I255 Ischemic cardiomyopathy: Secondary | ICD-10-CM

## 2021-11-22 DIAGNOSIS — Z95810 Presence of automatic (implantable) cardiac defibrillator: Secondary | ICD-10-CM

## 2021-11-22 DIAGNOSIS — I25118 Atherosclerotic heart disease of native coronary artery with other forms of angina pectoris: Secondary | ICD-10-CM

## 2021-11-22 DIAGNOSIS — I4729 Other ventricular tachycardia: Secondary | ICD-10-CM

## 2021-11-22 DIAGNOSIS — I447 Left bundle-branch block, unspecified: Secondary | ICD-10-CM

## 2021-11-22 DIAGNOSIS — I5022 Chronic systolic (congestive) heart failure: Secondary | ICD-10-CM

## 2021-12-08 ENCOUNTER — Encounter: Payer: Self-pay | Admitting: Pharmacist

## 2021-12-08 ENCOUNTER — Other Ambulatory Visit: Payer: Self-pay

## 2021-12-08 ENCOUNTER — Ambulatory Visit: Payer: MEDICAID | Attending: Nurse Practitioner | Admitting: Pharmacist

## 2021-12-08 VITALS — BP 190/77 | HR 66

## 2021-12-08 DIAGNOSIS — I10 Essential (primary) hypertension: Secondary | ICD-10-CM

## 2021-12-08 MED ORDER — LOSARTAN POTASSIUM 100 MG PO TABS
100.0000 mg | ORAL_TABLET | Freq: Every day | ORAL | 3 refills | Status: DC
  Filled 2021-12-08: qty 30, 30d supply, fill #0

## 2021-12-08 MED ORDER — NITROGLYCERIN 0.4 MG SL SUBL
0.4000 mg | SUBLINGUAL_TABLET | SUBLINGUAL | 3 refills | Status: DC | PRN
  Filled 2021-12-08: qty 25, 15d supply, fill #0

## 2021-12-08 MED ORDER — SPIRONOLACTONE 25 MG PO TABS
25.0000 mg | ORAL_TABLET | Freq: Every day | ORAL | 3 refills | Status: DC
  Filled 2021-12-08: qty 30, 30d supply, fill #0

## 2021-12-08 MED ORDER — AMLODIPINE BESYLATE 5 MG PO TABS
5.0000 mg | ORAL_TABLET | Freq: Two times a day (BID) | ORAL | 3 refills | Status: DC
  Filled 2021-12-08: qty 30, 15d supply, fill #0
  Filled 2022-03-12: qty 60, 30d supply, fill #1
  Filled 2022-05-25: qty 30, 15d supply, fill #2

## 2021-12-08 NOTE — Progress Notes (Signed)
? ?S:    ?PCP: Zelda  ? ?No chief complaint on file. ? ?[name] is a 81 y.o. male who presents for hypertension evaluation, education, and management. PMH is significant for CAD, CHF, ICD.BiV implant, HTN, HLD, tobacco abuse with COPD, OSA. Patient was referred and last seen by Primary Care Provider, Geryl Rankins, on 11/13/2021. BP was 175/87 at that visit. His isosorbide was increased at that appt.  ? ?Today, patient arrives in good spirits and presents without assistance. Denies dizziness, headache, blurred vision, swelling. He has noted intermittent symptoms of chest tightness that radiates to his back. It is partially relieved with sl Ntg.He described similar symptoms in Feb this year. It has not worsened and he is scheduled for a stress test 12/15/2021.  ? ?Patient reports hypertension is longstanding.  ? ?Family/Social history:  ?-Fhx: MI, stroke  ?-Tobacco: former smoker (quit in 2014) ?-Alcohol: none reported  ? ?Medication adherence reported. Patient has  taken BP medications today.  ? ?Current antihypertensives include: amlodipine 5 mg daily, metoprolol tartrate 100 mg BID, losartan 100 mg daily  ? ?Reported home BP readings: none ? ?Patient reported dietary habits:  ?-Compliant with salt restriction ?-Denies drinking excessive caffeine  ? ?Patient-reported exercise habits: none  ? ?O:  ?Vitals:  ? 12/08/21 1424  ?BP: (!) 190/77  ?Pulse: 66  ? ? ?Last 3 Office BP readings: ?BP Readings from Last 3 Encounters:  ?12/08/21 (!) 190/77  ?11/13/21 (!) 175/87  ?11/07/21 (!) 150/90  ? ? ?BMET ?   ?Component Value Date/Time  ? NA 144 07/21/2021 1546  ? K 3.8 07/21/2021 1546  ? CL 104 07/21/2021 1546  ? CO2 25 07/21/2021 1546  ? GLUCOSE 99 07/21/2021 1546  ? GLUCOSE 91 02/13/2021 0741  ? BUN 11 07/21/2021 1546  ? CREATININE 0.90 07/21/2021 1546  ? CREATININE 0.83 04/17/2016 0330  ? CALCIUM 9.0 07/21/2021 1546  ? GFRNONAA >60 02/13/2021 0741  ? GFRNONAA 88 04/17/2016 0330  ? GFRAA 90 01/12/2020 1539  ? GFRAA >89  04/17/2016 0330  ? ? ?Renal function: ?CrCl cannot be calculated (Patient's most recent lab result is older than the maximum 21 days allowed.). ? ?Clinical ASCVD: Yes  ?The 10-year ASCVD risk score (Arnett DK, et al., 2019) is: 42.3% ?  Values used to calculate the score: ?    Age: 49 years ?    Sex: Male ?    Is Non-Hispanic African American: Yes ?    Diabetic: No ?    Tobacco smoker: No ?    Systolic Blood Pressure: 99991111 mmHg ?    Is BP treated: Yes ?    HDL Cholesterol: 65 mg/dL ?    Total Cholesterol: 212 mg/dL ? ?A/P: ?Hypertension longstanding currently uncontrolled on current medications. BP goal < 130/80 mmHg. Medication adherence appears appropriate.  ?-Started spironolactone 25 mg daily. ?-Continue other medications at current doses. Pt encouraged to keep close follow-up with Cardiologist.   ?-Patient educated on purpose, proper use, and potential adverse effects of Spironolactone..  ?-F/u labs ordered - BMP anticipated in 3-7 days. Instructed pt to return in 1 week for labs and BP check.  ?-Counseled on lifestyle modifications for blood pressure control including reduced dietary sodium, increased exercise, adequate sleep. ?-Encouraged patient to check BP at home and bring log of readings to next visit. Counseled on proper use of home BP cuff.  ? ?Results reviewed and written information provided. Patient verbalized understanding of treatment plan. Total time in face-to-face counseling 30 minutes.  ? ?  F/u clinic visit in 1 week.  ? ?Benard Halsted, PharmD, BCACP, CPP ?Clinical Pharmacist ?[place] ?[phone] ? ? ?

## 2021-12-14 ENCOUNTER — Telehealth (HOSPITAL_COMMUNITY): Payer: Self-pay | Admitting: *Deleted

## 2021-12-14 NOTE — Telephone Encounter (Signed)
Close encounter 

## 2021-12-15 ENCOUNTER — Ambulatory Visit (HOSPITAL_COMMUNITY)
Admission: RE | Admit: 2021-12-15 | Discharge: 2021-12-15 | Disposition: A | Discharge status: Home / Self Care | Payer: Self-pay | Source: Ambulatory Visit | Attending: Cardiology | Admitting: Cardiology

## 2021-12-15 DIAGNOSIS — I255 Ischemic cardiomyopathy: Secondary | ICD-10-CM | POA: Insufficient documentation

## 2021-12-15 DIAGNOSIS — I447 Left bundle-branch block, unspecified: Secondary | ICD-10-CM | POA: Insufficient documentation

## 2021-12-15 DIAGNOSIS — Z95810 Presence of automatic (implantable) cardiac defibrillator: Secondary | ICD-10-CM | POA: Insufficient documentation

## 2021-12-15 DIAGNOSIS — I4729 Other ventricular tachycardia: Secondary | ICD-10-CM | POA: Insufficient documentation

## 2021-12-15 DIAGNOSIS — I5022 Chronic systolic (congestive) heart failure: Secondary | ICD-10-CM | POA: Insufficient documentation

## 2021-12-15 DIAGNOSIS — I25118 Atherosclerotic heart disease of native coronary artery with other forms of angina pectoris: Secondary | ICD-10-CM | POA: Insufficient documentation

## 2021-12-15 LAB — MYOCARDIAL PERFUSION IMAGING
LV dias vol: 136 mL (ref 62–150)
LV sys vol: 98 mL
Nuc Stress EF: 28 %
Peak HR: 72 {beats}/min
Rest HR: 60 {beats}/min
Rest Nuclear Isotope Dose: 10.7 mCi
SDS: 2
SRS: 1
SSS: 3
ST Depression (mm): 0 mm
Stress Nuclear Isotope Dose: 30.4 mCi
TID: 1.01

## 2021-12-15 MED ORDER — AMINOPHYLLINE 25 MG/ML IV SOLN
75.0000 mg | Freq: Once | INTRAVENOUS | Status: AC
  Administered 2021-12-15: 75 mg via INTRAVENOUS

## 2021-12-15 MED ORDER — TECHNETIUM TC 99M TETROFOSMIN IV KIT
30.4000 | PACK | Freq: Once | INTRAVENOUS | Status: AC | PRN
  Administered 2021-12-15: 30.4 via INTRAVENOUS
  Filled 2021-12-15: qty 31

## 2021-12-15 MED ORDER — TECHNETIUM TC 99M TETROFOSMIN IV KIT
10.7000 | PACK | Freq: Once | INTRAVENOUS | Status: AC | PRN
  Administered 2021-12-15: 10.7 via INTRAVENOUS
  Filled 2021-12-15: qty 11

## 2021-12-15 MED ORDER — REGADENOSON 0.4 MG/5ML IV SOLN
0.4000 mg | Freq: Once | INTRAVENOUS | Status: AC
  Administered 2021-12-15: 0.4 mg via INTRAVENOUS

## 2021-12-19 ENCOUNTER — Other Ambulatory Visit: Payer: Self-pay

## 2021-12-19 DIAGNOSIS — I447 Left bundle-branch block, unspecified: Secondary | ICD-10-CM

## 2021-12-19 DIAGNOSIS — I5042 Chronic combined systolic (congestive) and diastolic (congestive) heart failure: Secondary | ICD-10-CM

## 2021-12-19 DIAGNOSIS — I25118 Atherosclerotic heart disease of native coronary artery with other forms of angina pectoris: Secondary | ICD-10-CM

## 2021-12-20 ENCOUNTER — Telehealth: Payer: Self-pay | Admitting: Student-PharmD

## 2021-12-20 NOTE — Telephone Encounter (Signed)
Patient appearing on report for [employer] - Hypertension Control report due to last documented ambulatory blood pressure of 190/77 on 12/08/21 and spironolactone was started. Next appointment with [facility] clinical pharmacist is 12/25/21 and PCP is 02/14/22.  ? ?Outreached patient to discuss hypertension control and medication management.  ? ?Current antihypertensives: losartan 100 mg daily, amlodipine 5 mg BID, spironolactone 25 mg daily, metoprolol tartrate 100 mg BID, Imdur 30 mg daily ? ?Unable to reach patient. LVM requesting call back.  ? ?Pervis Hocking, PharmD ?PGY2 Ambulatory Care Pharmacy Resident ?12/20/2021 2:43 PM ? ?

## 2021-12-21 NOTE — Progress Notes (Unsigned)
? ?  S:    ?[name] is a 81 y.o. male who presents for hypertension evaluation, education, and management. PMH is significant for HTN, CAD, ICM, COPD, OSA, HLD. Patient was referred and last seen by Primary Care Provider, Bertram Denver, NP, on 11/13/21 when BP was 175/87 and isosorbide was increased. At last visit with CPP on 12/08/21, BP was 190/77 and spironolactone was started.  ? ?Today, patient arrives in *** spirits and presents {w-w/o:315700} assistance. *** Denies dizziness, headache, blurred vision, swelling.  ?***bmet, increase spiro? Next pcp 6/7 ?Swith amlo to daily? Switch losartan to val or irbe? ? ?Patient reports hypertension was diagnosed in ***.  ? ?Family/Social history:  ?-Former smoker ? ?Medication adherence *** . Patient has *** taken BP medications today.  ? ?Current antihypertensives include: losartan 100 mg daily, amlodipine 5 mg BID, spironolactone 25 mg daily, metoprolol tartrate 100 mg BID, Imdur 30 mg daily ? ?Reported home BP readings: *** ? ?Patient reported dietary habits: Eats *** meals/day ?Breakfast: *** ?Lunch: *** ?Dinner: *** ?Snacks: *** ?Drinks: *** ? ?Patient-reported exercise habits: *** ? ?ASCVD risk factors include: *** ? ?O:  ?Last 3 Office BP readings: ?BP Readings from Last 3 Encounters:  ?12/08/21 (!) 190/77  ?11/13/21 (!) 175/87  ?11/07/21 (!) 150/90  ? ? ?BMET ?   ?Component Value Date/Time  ? NA 144 07/21/2021 1546  ? K 3.8 07/21/2021 1546  ? CL 104 07/21/2021 1546  ? CO2 25 07/21/2021 1546  ? GLUCOSE 99 07/21/2021 1546  ? GLUCOSE 91 02/13/2021 0741  ? BUN 11 07/21/2021 1546  ? CREATININE 0.90 07/21/2021 1546  ? CREATININE 0.83 04/17/2016 0330  ? CALCIUM 9.0 07/21/2021 1546  ? GFRNONAA >60 02/13/2021 0741  ? GFRNONAA 88 04/17/2016 0330  ? GFRAA 90 01/12/2020 1539  ? GFRAA >89 04/17/2016 0330  ? ? ?Renal function: ?CrCl cannot be calculated (Patient's most recent lab result is older than the maximum 21 days allowed.). ? ?Clinical ASCVD: Yes  ?The 10-year ASCVD  risk score (Arnett DK, et al., 2019) is: 42.3% ?  Values used to calculate the score: ?    Age: 77 years ?    Sex: Male ?    Is Non-Hispanic African American: Yes ?    Diabetic: No ?    Tobacco smoker: No ?    Systolic Blood Pressure: 190 mmHg ?    Is BP treated: Yes ?    HDL Cholesterol: 65 mg/dL ?    Total Cholesterol: 212 mg/dL ? ?A/P: ?Hypertension diagnosed *** currently *** on current medications. BP goal < 130/80 mmHg. Medication adherence appears ***. Control is suboptimal due to ***.  ?-{Meds adjust:18428} ***.  ?-Patient educated on purpose, proper use, and potential adverse effects of ***.  ?-F/u labs ordered - *** ?-Counseled on lifestyle modifications for blood pressure control including reduced dietary sodium, increased exercise, adequate sleep. ?-Encouraged patient to check BP at home and bring log of readings to next visit. Counseled on proper use of home BP cuff.  ? ?Results reviewed and written information provided. Patient verbalized understanding of treatment plan. Total time in face-to-face counseling *** minutes.  ? ?F/u clinic visit in ***.  ? ?

## 2021-12-22 NOTE — Telephone Encounter (Signed)
2nd attempt to reach patient. Unable to reach. LVM reminding of appt on 12/25/21 with clinical pharmacist at [facility]. Will follow up with him at that time regarding BP control.  ?

## 2021-12-25 ENCOUNTER — Ambulatory Visit: Payer: Self-pay | Admitting: Pharmacist

## 2021-12-25 ENCOUNTER — Other Ambulatory Visit: Payer: Self-pay

## 2021-12-28 ENCOUNTER — Other Ambulatory Visit: Payer: Self-pay

## 2022-01-04 ENCOUNTER — Encounter (HOSPITAL_COMMUNITY): Payer: Self-pay | Admitting: Cardiology

## 2022-01-04 ENCOUNTER — Ambulatory Visit (HOSPITAL_COMMUNITY): Payer: MEDICAID

## 2022-01-15 ENCOUNTER — Ambulatory Visit (HOSPITAL_COMMUNITY): Payer: Self-pay | Attending: Cardiology

## 2022-01-15 DIAGNOSIS — I447 Left bundle-branch block, unspecified: Secondary | ICD-10-CM | POA: Insufficient documentation

## 2022-01-15 DIAGNOSIS — I5042 Chronic combined systolic (congestive) and diastolic (congestive) heart failure: Secondary | ICD-10-CM | POA: Insufficient documentation

## 2022-01-15 DIAGNOSIS — I25118 Atherosclerotic heart disease of native coronary artery with other forms of angina pectoris: Secondary | ICD-10-CM | POA: Insufficient documentation

## 2022-01-15 LAB — ECHOCARDIOGRAM COMPLETE
Area-P 1/2: 3.63 cm2
S' Lateral: 2.8 cm

## 2022-01-16 NOTE — Progress Notes (Unsigned)
? ?  S:    ?[name] is a 81 y.o. male who presents for hypertension evaluation, education, and management. PMH is significant for HTN, CAD, ICM, COPD, OSA, HLD. Patient was referred and last seen by Primary Care Provider, Bertram Denver, NP, on 11/13/21 when BP was 175/87 and isosorbide was increased. At last visit with CPP on 12/08/21, BP was 190/77 and spironolactone was started.  ? ?Today, patient arrives in *** spirits and presents {w-w/o:315700} assistance. *** Denies dizziness, headache, blurred vision, swelling.  ?***bmet, increase spiro? Next pcp 6/7 ?Swith amlo to 10 daily? Switch losartan to val or irbe? ? ?Patient reports hypertension was diagnosed in ***.  ? ?Family/Social history:  ?-Former smoker ? ?Medication adherence *** . Patient has *** taken BP medications today.  ? ?Current antihypertensives include: losartan 100 mg daily, amlodipine 5 mg BID, spironolactone 25 mg daily, metoprolol tartrate 100 mg BID, Imdur 30 mg daily ? ?Reported home BP readings: *** ? ?Patient reported dietary habits: Eats *** meals/day ?Breakfast: *** ?Lunch: *** ?Dinner: *** ?Snacks: *** ?Drinks: *** ? ?Patient-reported exercise habits: *** ? ?ASCVD risk factors include: *** ? ?O:  ?Last 3 Office BP readings: ?BP Readings from Last 3 Encounters:  ?12/08/21 (!) 190/77  ?11/13/21 (!) 175/87  ?11/07/21 (!) 150/90  ? ? ?BMET ?   ?Component Value Date/Time  ? NA 144 07/21/2021 1546  ? K 3.8 07/21/2021 1546  ? CL 104 07/21/2021 1546  ? CO2 25 07/21/2021 1546  ? GLUCOSE 99 07/21/2021 1546  ? GLUCOSE 91 02/13/2021 0741  ? BUN 11 07/21/2021 1546  ? CREATININE 0.90 07/21/2021 1546  ? CREATININE 0.83 04/17/2016 0330  ? CALCIUM 9.0 07/21/2021 1546  ? GFRNONAA >60 02/13/2021 0741  ? GFRNONAA 88 04/17/2016 0330  ? GFRAA 90 01/12/2020 1539  ? GFRAA >89 04/17/2016 0330  ? ? ?Renal function: ?CrCl cannot be calculated (Patient's most recent lab result is older than the maximum 21 days allowed.). ? ?Clinical ASCVD: Yes  ?The 10-year ASCVD  risk score (Arnett DK, et al., 2019) is: 42.3% ?  Values used to calculate the score: ?    Age: 12 years ?    Sex: Male ?    Is Non-Hispanic African American: Yes ?    Diabetic: No ?    Tobacco smoker: No ?    Systolic Blood Pressure: 190 mmHg ?    Is BP treated: Yes ?    HDL Cholesterol: 65 mg/dL ?    Total Cholesterol: 212 mg/dL ? ?A/P: ?Hypertension diagnosed *** currently *** on current medications. BP goal < 130/80 mmHg. Medication adherence appears ***. Control is suboptimal due to ***.  ?-{Meds adjust:18428} ***.  ?-Patient educated on purpose, proper use, and potential adverse effects of ***.  ?-F/u labs ordered - *** ?-Counseled on lifestyle modifications for blood pressure control including reduced dietary sodium, increased exercise, adequate sleep. ?-Encouraged patient to check BP at home and bring log of readings to next visit. Counseled on proper use of home BP cuff.  ? ?Results reviewed and written information provided. Patient verbalized understanding of treatment plan. Total time in face-to-face counseling *** minutes.  ? ?F/u clinic visit in ***.  ? ?

## 2022-01-22 ENCOUNTER — Ambulatory Visit: Payer: Self-pay | Admitting: Pharmacist

## 2022-01-26 NOTE — Progress Notes (Signed)
[name] Date of Birth: 06-05-1941 Medical Record [id number]  History of Present Illness: [name] is seen for follow up of CAD and CHF. He has multiple medical issues which include HTN,  HLD and known CAD. He has a history of tobacco abuse. He has LV dysfunction with ejection fraction of 40-45% in April 2013.  Not able to take higher beta blocker dose due to his resting bradycardia.  He had a Myoview study in March of 2013 which showed normal perfusion. Ejection fraction of 40%. He was admitted to [facility] in December 2014 with chest pain. Cardiac cath showed patent RCA stent. There was a 50% mid LAD lesion. The first diagonal and first OM were occluded. Attempted PCI of the OM but unable to cross with wire. Review of old films here showed that this was a chronic obstruction. He has a history of NSVT. He was admitted in  August 2015 with VDRF with acute pulmonary edema. Echo showed drop in EF to 20-25%. In September 2015 he was admitted with syncope associated with bradycardia and overdiuresis. Diruetics were reduced and coreg was held. Repeat Echo in November 2015 showed EF of 30%. He did undergo ICD/BiV implant  by Dr. Rayann Heman.  In 2017  he complained of dyspnea on exertion. No clinical evidence of CHF. Echo showed EF 45-50%. Global HK. Mild MR. Myoview study showed a fixed inferolateral infarct without ischemia. EF 39%. He ultimately underwent CPX. This was limited by submaximal exercise and severe hypertensive response. There was mild COPD. Exercise training and improved BP control recommended. Toprol was increased.   Followed regularly in EP clinic for his ICD. Last Echo in 2019 showed EF 45% with moderate LVH  When seen in May 2021 noted more SOB. CXR was clear. BNP 118. Echo showed improved EF to 50-55%. No significant valvular disease.   Seen in the ED on 02/13/21 with dizziness. Patient states he was at work and almost passed out. Evaluation was negative except for urine  culture which grew Staph aureus. Treated with antibiotics. Device check showed brief NSVT that did not correlate with symptoms. He is did undergo generator change out of ICD in November for ERI.   On follow up today he reports he fell on Feb 6 and fractured his left distal fibula. Seen in the ED. Was given pain meds and meloxicam. Notes it is improving but still has some sharp pains. He has noted intermittent symptoms of chest tightness that radiates to his back. It is partially relieved with sl Ntg. Denies smoking. He is taking lipitor now.   To further evaluate he had a Myoview study showing an old inferolateral scar. No ischemia. EF was lower than prior at 28%. We obtained an Echo showing EF 30-35% which was lower than before. He is seen today to review his medications and optimize his CHF therapy.   He states that he stopped taking aldactone because it makes him sweaty and he feels bad. He has no increased SOB or angina. No swelling. Still works as Presenter, broadcasting.    Current Outpatient Medications on File Prior to Visit  Medication Sig Dispense Refill   acetaminophen (TYLENOL) 650 MG CR tablet Take 650 mg by mouth every 8 (eight) hours as needed for pain.     albuterol (VENTOLIN HFA) 108 (90 Base) MCG/ACT inhaler Inhale 1-2 puffs into the lungs every 6 (six) hours as needed for wheezing or shortness of breath. 18 g 1   amLODipine (NORVASC) 5 MG  tablet Take 1 tablet (5 mg total) by mouth in the morning and at bedtime. 30 tablet 3   atorvastatin (LIPITOR) 20 MG tablet Take 1 tablet (20 mg total) by mouth daily. 90 tablet 3   budesonide-formoterol (SYMBICORT) 160-4.5 MCG/ACT inhaler INHALE 2 PUFFS BY MOUTH TWICE A DAY 10.2 g 3   clopidogrel (PLAVIX) 75 MG tablet TAKE 1 TABLET DAILY. 90 tablet 3   isosorbide mononitrate (IMDUR) 30 MG 24 hr tablet Take 1 tablet (30 mg total) by mouth daily. 90 tablet 1   isosorbide mononitrate (IMDUR) 60 MG 24 hr tablet Take 1 tablet (60 mg total) by mouth daily. 90  tablet 2   meloxicam (MOBIC) 15 MG tablet Take 1 tablet (15 mg total) by mouth daily. 30 tablet 0   metoprolol tartrate (LOPRESSOR) 100 MG tablet Take 1 tablet (100 mg total) by mouth 2 (two) times daily. 180 tablet 1   nitroGLYCERIN (NITROSTAT) 0.4 MG SL tablet Place 1 tablet (0.4 mg total) under the tongue every 5 (five) minutes as needed for chest pain. 25 tablet 3   potassium chloride SA (K-DUR,KLOR-CON) 20 MEQ tablet Take 40 mEq by mouth daily.     No current facility-administered medications on file prior to visit.    Allergies  Allergen Reactions   Acyclovir And Related     GI upset    Aspirin Other (See Comments)    GI upset at high doses only.    Past Medical History:  Diagnosis Date   Acute renal failure ([place]) 05/31/2014   Angina decubitus ([place]) 06/15/2014   CAD (coronary artery disease)    a. s/p stent RCA DES 9/09; b. 2014 Attempted PCI of OM1 @ [place];  c. 04/2014 Cath: LAD 40-50p, D1 95-99 (chronic), LCX 30-40 inf branch, OM1 CTO, RCA 30-40p, RCA patent stent, EF 35%->Med Rx.   Chronic combined systolic and diastolic CHF (congestive heart failure) (HCC)    a. EF about 40 to 45% per echo in April 2013;  b. 04/2014 Echo: EF 20-25%, sev LVH, sev glob HK, Gr 1 DD, mildly reduced RV fxn, PASP 75mmHg. c. 01/2017: EF improved to 50-55%.    Chronic low back pain    Complete heart block (HCC)    COPD (chronic obstructive pulmonary disease) ([place]) dx 06/2013   PFTs 07/08/13: mod obst with resp to bronchodilator, moderate decrease diffusion, airtrapping   COPD with acute exacerbation ([place]) 01/17/2017   COPD with asthma ([place])    07/08/13 PFT: FEV1 1.74L (66% pred, 30% change with BD), mod obst with resp to bronchodilator, moderate decrease diffusion, air-trapping 11/2013 Simple spiro>> clear obstruction, FEV1 1.30 L (47% pred) - trial of symbicort 160 2bid 01/26/15      Depression 09/15/2018   Assessment: Increased sadness and depression since losing job  Plan: Patient denies  suicidal and homicidal ideations Patient would not like to start medication therapies at this time Will establish patient with community [facility] wellness for primary care   Dyslipidemia    a. on statin   Essential hypertension    HLD (hyperlipidemia)    HTN (hypertension)    a. Reports intolerance to hydralazine; b. no beta blockers 2/2 bradycardia;  c. failed on ACE and ARB.   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 04/2014 Echo: EF 20-25%, sev glob HK.   Mixed Ischemic/Non-Ischemic Cardiomyopathy  probable mixed ischemic and non-ischemic    Noncompliance    OSA (obstructive sleep apnea) 04/18/2018   06/11/16 - home sleep study shows AHI of 2.9 an hour with the lowest SaO2 of 79% with an average of 93%  03/08/2018-Home sleep study-AHI 7/HR, SaO2 low 81%    Paroxysmal atrial fibrillation ([place])    a. identified on device interrogation 01/2016   Pneumonia 09/02/2013   Pulmonary edema 05/10/2014   Respiratory arrest ([place]) 05/18/2014   Sinus bradycardia 05/31/2014   Ventricular tachycardia ([place])    treated with ATP,  CL 250-300 msec    Past Surgical History:  Procedure Laterality Date   BI-VENTRICULAR IMPLANTABLE CARDIOVERTER DEFIBRILLATOR N/A 08/12/2014   MDT [name] CRTD implanted by Dr Rayann Heman   BIV ICD GENERATOR CHANGEOUT N/A 08/08/2021   Procedure: BIV ICD GENERATOR CHANGEOUT;  Surgeon: Thompson Grayer, MD;  Location: [place];  Service: Cardiovascular;  Laterality: N/A;   CARDIAC CATHETERIZATION     ejection fraction 50%   LEFT HEART CATHETERIZATION WITH CORONARY ANGIOGRAM N/A 05/08/2014   Procedure: LEFT HEART CATHETERIZATION WITH CORONARY ANGIOGRAM;  Surgeon: Blane Ohara, MD;  Location: [facility] CATH LAB;  Service: Cardiovascular;  Laterality: N/A;    Social History   Tobacco Use  Smoking Status Former    Packs/day: 0.50   Years: 56.00   Pack years: 28.00   Types: Cigarettes   Quit date: 09/10/2012   Years since quitting: 9.4  Smokeless Tobacco Never  Tobacco Comments   tried 1 month ago (05/2018) but didn't like it     Social History   Substance and Sexual Activity  Alcohol Use No    Family History  Problem Relation Age of Onset   Heart attack Neg Hx    Stroke Neg Hx     Review of Systems: As noted in history of present illness  All other systems were reviewed and are negative.  Physical Exam: BP 134/68 (BP Location: Left Arm, Patient Position: Sitting, Cuff Size: Normal)   Pulse 77   Ht 5' 8.5" (1.74 m)   Wt 148 lb (67.1 kg)   BMI 22.18 kg/m  GENERAL:  Well appearing BM in NAD HEENT:  PERRL, EOMI, sclera are clear. Oropharynx is clear. NECK:  No jugular venous distention, carotid upstroke brisk and symmetric, no bruits, no thyromegaly or adenopathy LUNGS:  Clear to auscultation bilaterally CHEST:  Unremarkable HEART:  RRR,  PMI not displaced or sustained,S1 and S2 within normal limits, no S3, no S4: no clicks, no rubs, no murmurs ABD:  Soft, nontender. BS +, no masses or bruits. No hepatomegaly, no splenomegaly EXT:  2 + pulses throughout, no edema, no cyanosis no clubbing SKIN:  Warm and dry.  No rashes NEURO:  Alert and oriented x 3. Cranial nerves II through XII intact. PSYCH:  Cognitively intact     LABORATORY DATA:   Lab Results  Component Value Date   WBC 6.2 11/13/2021   HGB 12.3 (L) 11/13/2021   HCT 37.8 11/13/2021   PLT 171 11/13/2021   GLUCOSE 99 07/21/2021   CHOL 212 (H) 02/20/2021   TRIG 80 02/20/2021   HDL 65 02/20/2021   LDLDIRECT 132.9 06/18/2013   LDLCALC 133 (H) 02/20/2021   ALT 14 02/20/2021   AST 18 02/20/2021   NA 144 07/21/2021   K 3.8 07/21/2021   CL 104 07/21/2021   CREATININE 0.90 07/21/2021   BUN 11 07/21/2021   CO2 25 07/21/2021   TSH 2.67 01/31/2016  INR 1.09 06/20/2014   Ecg today shows AV/Bi V pacing. Rate 77. I  have personally reviewed and interpreted this study.   Echo: 02/20/16:Study Conclusions   - Left ventricle: There was moderate concentric hypertrophy.   Systolic function was mildly reduced. The estimated ejection   fraction was in the range of 45% to 50%. Mild diffuse hypokinesis   with no identifiable regional variations. Abnormal relaxation   with increased filling pressures. - Ventricular septum: Septal motion showed paradox. These changes   are consistent with right ventricular pacing. - Mitral valve: There was mild regurgitation  Myoview 02/20/16: Study Highlights   Nuclear stress EF: 39%. Diffuse hypokinesis with asynchronous contraction consistent with pacemaker There was no ST segment deviation noted during stress. Defect 1: There is a small defect of moderate severity present in the mid inferolateral location. Question possible focal lateral infarction. Findings consistent with prior myocardial infarction. No areas of ischemia identified. This is an intermediate risk study.   Candee Furbish, MD   CPX: 07/05/16: Narrative   Referred for: Exertional Dyspnea   Procedure: This patient underwent staged symptom-limited exercise treadmill testing using a modified Naughton protocol with expired gas analysis metabolic evaluation during exercise.  Demographics  Age: 81 Ht. (in.) 68.5 Wt. (lb) 151 BMI: 22.6      Predicted Peak VO2: 26.6  Gender: Male Ht (cm) 174 Wt. (kg) 68.5    Results  Pre-Exercise PFTs   FVC 2.70 (81%)       FEV1 1.64 (64%)         FEV1/FVC 61 (79%)         MVV 70 (58%)         Exercise Time:    9:06   Speed (mph): 2.0       Grade (%): 10.5      RPE: 17  Reason stopped: Test was ended due to hypertensive response achieving termination criteria prior to maximal exercise.  Additional symptoms: bilateral hip pain (5/10) dyspnea (3/10)  Resting HR: 65 Peak HR: 127   (86% age predicted max HR)  BP rest: 180/66 BP peak: 246/94  Peak VO2: 17.5  (66% predicted peak VO2)  VE/VCO2 slope:  33.4  OUES: 1.94  Peak RER: 0.98  Ventilatory Threshold: 16.5 (62% predicted or measured peak VO2)  Peak RR 34  Peak Ventilation:  44.3  VE/MVV:  63%  PETCO2 at peak:  31  O2pulse:  9   (75% predicted O2pulse)   Interpretation  Notes: Patient gave a very good effort. Pulse-oximetry remained 96% or above for the duration of exercise.   ECG:  Resting ECG AV paced rhythm. HR response appropriate. PVCs occasional. There were no sustained arrhythmias and no ST-T changes. BP hypertensive at rest with increasingly significant hypertensive response to incremental exercise.  PFT:  Pre-exercise spirometry suggests possible mild restrictive/obstructive patterns. MVV below normal.   CPX:  Exercise testing with gas exchange demonstrates a moderately reduced peak VO2 of 17.5 ml/kg/min (66% of the age/gender/weight matched sedentary norms). The RER of 0.98 indicates a submaximal effort. The VE/VCO2 slope is mildly elevated and indicates increased dead space ventiltion. The oxygen uptake efficiency slope (OUES) is moderately reduced. The VO2 at the ventilatory threshold was normal at 62% of the predicted peak VO2. At peak exercise, the ventilation reached 63% of the measured MVV indicating ventilatory reserve remained. The O2pulse (a surrogate for stroke volume) increased with initial exercise but remained flat at 9 ml/beeat (75% predicted).   Conclusion: The interpretation of this  test is limited due to submaximal effort during the exercise. Based on available data, exercise testing with gas exchange demonstrates moderate functional impairment. Pre-exercise spirometry suggests mild obstructive pattern. With significantly hypertensive response to exercise, flat O2 pulse and elevated VE/VCO2 slope patient is most likely limited due to diastolic dysfunction and possible early pulmonary vascular disease.    Test, report and preliminary impression by: Landis Martins, MS, ACSM-RCEP 07/05/2016 9:46 AM  Preliminary CPX Results, Finalized results will be forwarded when completed by interpreting physician.   Agree with above. This is submax test with mild functional limitation that is multifactorial in nature including COPD, deconditioning and severe hypertensive response with mild circulatory limitation.  Would consider exercise training program and tighter BP control.   Bensimhon, Daniel,MD 10:24 PM    Echo 08/04/18: Study Conclusions   - Left ventricle: The cavity size was normal. There was severe   concentric hypertrophy. Doppler parameters are consistent with   abnormal left ventricular relaxation (grade 1 diastolic   dysfunction). - Mitral valve: There was mild to moderate regurgitation. - Right ventricle: Systolic function was mildly reduced. - Pericardium, extracardiac: A trivial pericardial effusion was   identified.  Echo 01/28/20: IMPRESSIONS     1. Left ventricular ejection fraction, by estimation, is 50 to 55%. The  left ventricle has low normal function. The left ventricle has no regional  wall motion abnormalities. There is moderate concentric left ventricular  hypertrophy. Left ventricular  diastolic parameters are consistent with Grade I diastolic dysfunction  (impaired relaxation).   2. Right ventricular systolic function is normal. The right ventricular  size is normal.   3. Left atrial size was moderately dilated.   4. The mitral valve is normal in structure. Mild mitral valve  regurgitation. No evidence of mitral stenosis.   5. The aortic valve is tricuspid. Aortic valve regurgitation is not  visualized. No aortic stenosis is present.   6. The inferior vena cava is normal in size with greater than 50%  respiratory variability, suggesting right atrial pressure of 3 mmHg.  Myoview 12/15/21: Study Highlights      Findings are consistent with prior myocardial infarction. The study is intermediate risk.   No ST  deviation was noted.   Left ventricular function is abnormal. Global function is severely reduced. End diastolic cavity size is normal.   Prior study available for comparison from 02/20/2016.   Small, moderate intensity fixed lesion in the inferolateral wall consistent with old scar. No significant change from prior study  Echo 01/15/22: IMPRESSIONS     1. Left ventricular ejection fraction, by estimation, is 35%. The left  ventricle has moderately decreased function. The left ventricle  demonstrates regional wall motion abnormalities with basal to mid  inferolateral and anterolateral severe hypokinesis  to akinesis. There is severe left ventricular hypertrophy. The LVH is  impressive, ?forme fruste of hypertrophic cardiomyopathy. Left ventricular  diastolic parameters are consistent with Grade I diastolic dysfunction  (impaired relaxation).   2. Right ventricular systolic function is mildly reduced. The right  ventricular size is normal. Tricuspid regurgitation signal is inadequate  for assessing PA pressure.   3. Left atrial size was mildly dilated.   4. The mitral valve is normal in structure. Trivial mitral valve  regurgitation. No evidence of mitral stenosis.   5. The aortic valve is tricuspid. There is mild calcification of the  aortic valve. Aortic valve regurgitation is not visualized. No aortic  stenosis is present.   6. The inferior vena cava  is normal in size with greater than 50%  respiratory variability, suggesting right atrial pressure of 3 mmHg.   Assessment / Plan: 1. Chronic systolic and diastolic CHF with mixed cardiomyopathy. EF now down to  30-35%. Reports intolerance of aldactone. We will discontinue. I would like to switch his losartan to [facility]. Start at 49/51 mg bid. Will arrange follow up with pharm D in 3-4 weeks to optimize therapy. Consider SGLT2 inhibitor if affordable. Follow up with APP in 3 months.   2. Coronary disease with remote stenting of the right  coronary in 2009 with a drug-eluting stent. Chronic occlusion of OM. Recent myoview showed no change with old inferolateral scar. No ischemia.  Currently without angina.  3. Tobacco abuse. States his is no longer smoking. Continue Symbicort for COPD  4. Hyperlipidemia  - reports compliance with statin. Needs lipid panel and chemistries.   5. COPD with asthma.  6. HTN   7. NSVT s/p ICD. Asymptomatic.  8. S/p ICD/BiV pacemaker. S/p generator change out Nov 2022.      Follow up in 3 months with APP

## 2022-02-02 ENCOUNTER — Other Ambulatory Visit: Payer: Self-pay

## 2022-02-02 ENCOUNTER — Encounter: Payer: Self-pay | Admitting: Cardiology

## 2022-02-02 ENCOUNTER — Ambulatory Visit (INDEPENDENT_AMBULATORY_CARE_PROVIDER_SITE_OTHER): Payer: Self-pay | Admitting: Cardiology

## 2022-02-02 VITALS — BP 134/68 | HR 77 | Ht 68.5 in | Wt 148.0 lb

## 2022-02-02 DIAGNOSIS — I472 Ventricular tachycardia, unspecified: Secondary | ICD-10-CM

## 2022-02-02 DIAGNOSIS — I25118 Atherosclerotic heart disease of native coronary artery with other forms of angina pectoris: Secondary | ICD-10-CM

## 2022-02-02 DIAGNOSIS — I255 Ischemic cardiomyopathy: Secondary | ICD-10-CM

## 2022-02-02 DIAGNOSIS — I5042 Chronic combined systolic (congestive) and diastolic (congestive) heart failure: Secondary | ICD-10-CM

## 2022-02-02 DIAGNOSIS — I447 Left bundle-branch block, unspecified: Secondary | ICD-10-CM

## 2022-02-02 MED ORDER — SACUBITRIL-VALSARTAN 49-51 MG PO TABS
1.0000 | ORAL_TABLET | Freq: Two times a day (BID) | ORAL | 6 refills | Status: DC
  Filled 2022-02-02 (×2): qty 60, 30d supply, fill #0

## 2022-02-02 NOTE — Patient Instructions (Signed)
Medication Instructions:   -Stop spironolactone (aldactone).  -Stop losartan (cozaar).   -Start entresto 49/51mg  twice daily.   *If you need a refill on your cardiac medications before your next appointment, please call your pharmacy*   Lab Work: Your physician recommends that you return for lab work in: next week or 2 for FASTING CMET and Lipids  If you have labs (blood work) drawn today and your tests are completely normal, you will receive your results only by: MyChart Message (if you have MyChart) OR A paper copy in the mail If you have any lab test that is abnormal or we need to change your treatment, we will call you to review the results.    Follow-Up: At [facility], you and your health needs are our priority.  As part of our continuing mission to provide you with exceptional heart care, we have created designated Provider Care Teams.  These Care Teams include your primary Cardiologist (physician) and Advanced Practice Providers (APPs -  Physician Assistants and Nurse Practitioners) who all work together to provide you with the care you need, when you need it.  We recommend signing up for the patient portal called "MyChart".  Sign up information is provided on this After Visit Summary.  MyChart is used to connect with patients for Virtual Visits (Telemedicine).  Patients are able to view lab/test results, encounter notes, upcoming appointments, etc.  Non-urgent messages can be sent to your provider as well.   To learn more about what you can do with MyChart, go to [email or web address].    Your next appointment:   3-4 month(s)  The format for your next appointment:   In Person  Provider:   Edd Fabian, FNP, Marjie Skiff, PA-C, Joni Reining, DNP, ANP, or Azalee Course, PA-C       Other Instructions We will make you an appointment to come back to see a PharmD in 3-4 weeks.

## 2022-02-06 ENCOUNTER — Other Ambulatory Visit: Payer: Self-pay

## 2022-02-07 ENCOUNTER — Other Ambulatory Visit: Payer: Self-pay

## 2022-02-09 ENCOUNTER — Ambulatory Visit (INDEPENDENT_AMBULATORY_CARE_PROVIDER_SITE_OTHER): Payer: Self-pay

## 2022-02-09 DIAGNOSIS — I255 Ischemic cardiomyopathy: Secondary | ICD-10-CM

## 2022-02-11 LAB — CUP PACEART REMOTE DEVICE CHECK
Battery Remaining Longevity: 71 mo
Battery Voltage: 2.98 V
Brady Statistic AP VP Percent: 50.72 %
Brady Statistic AP VS Percent: 0.15 %
Brady Statistic AS VP Percent: 48.6 %
Brady Statistic AS VS Percent: 0.54 %
Brady Statistic RA Percent Paced: 49.13 %
Brady Statistic RV Percent Paced: 97.33 %
Date Time Interrogation Session: 20230602211709
HighPow Impedance: 67 Ohm
Implantable Lead Implant Date: 20151203
Implantable Lead Implant Date: 20151203
Implantable Lead Implant Date: 20151203
Implantable Lead Location: 753858
Implantable Lead Location: 753859
Implantable Lead Location: 753860
Implantable Lead Model: 4598
Implantable Lead Model: 5076
Implantable Lead Model: 6935
Implantable Pulse Generator Implant Date: 20221129
Lead Channel Impedance Value: 152 Ohm
Lead Channel Impedance Value: 152 Ohm
Lead Channel Impedance Value: 160.941
Lead Channel Impedance Value: 160.941
Lead Channel Impedance Value: 160.941
Lead Channel Impedance Value: 247 Ohm
Lead Channel Impedance Value: 304 Ohm
Lead Channel Impedance Value: 304 Ohm
Lead Channel Impedance Value: 304 Ohm
Lead Channel Impedance Value: 342 Ohm
Lead Channel Impedance Value: 342 Ohm
Lead Channel Impedance Value: 361 Ohm
Lead Channel Impedance Value: 399 Ohm
Lead Channel Impedance Value: 532 Ohm
Lead Channel Impedance Value: 532 Ohm
Lead Channel Impedance Value: 532 Ohm
Lead Channel Impedance Value: 532 Ohm
Lead Channel Impedance Value: 551 Ohm
Lead Channel Pacing Threshold Amplitude: 0.625 V
Lead Channel Pacing Threshold Amplitude: 1.125 V
Lead Channel Pacing Threshold Amplitude: 1.25 V
Lead Channel Pacing Threshold Pulse Width: 0.4 ms
Lead Channel Pacing Threshold Pulse Width: 0.4 ms
Lead Channel Pacing Threshold Pulse Width: 0.4 ms
Lead Channel Sensing Intrinsic Amplitude: 0.625 mV
Lead Channel Sensing Intrinsic Amplitude: 0.625 mV
Lead Channel Sensing Intrinsic Amplitude: 23.875 mV
Lead Channel Sensing Intrinsic Amplitude: 23.875 mV
Lead Channel Setting Pacing Amplitude: 1.5 V
Lead Channel Setting Pacing Amplitude: 2.25 V
Lead Channel Setting Pacing Amplitude: 3 V
Lead Channel Setting Pacing Pulse Width: 0.4 ms
Lead Channel Setting Pacing Pulse Width: 0.4 ms
Lead Channel Setting Sensing Sensitivity: 0.3 mV

## 2022-02-12 ENCOUNTER — Other Ambulatory Visit: Payer: Self-pay | Admitting: Nurse Practitioner

## 2022-02-12 ENCOUNTER — Other Ambulatory Visit: Payer: Self-pay

## 2022-02-12 MED ORDER — SACUBITRIL-VALSARTAN 49-51 MG PO TABS: 1.0000 | ORAL_TABLET | Freq: Two times a day (BID) | ORAL | 0 refills | Status: DC

## 2022-02-13 ENCOUNTER — Other Ambulatory Visit: Payer: Self-pay

## 2022-02-14 ENCOUNTER — Other Ambulatory Visit: Payer: Self-pay

## 2022-02-14 ENCOUNTER — Ambulatory Visit: Payer: Self-pay | Admitting: Nurse Practitioner

## 2022-02-16 LAB — COMPREHENSIVE METABOLIC PANEL
ALT: 8 IU/L (ref 0–44)
AST: 19 IU/L (ref 0–40)
Albumin/Globulin Ratio: 1.6 (ref 1.2–2.2)
Albumin: 4.6 g/dL (ref 3.7–4.7)
Alkaline Phosphatase: 113 IU/L (ref 44–121)
BUN/Creatinine Ratio: 9 — ABNORMAL LOW (ref 10–24)
BUN: 8 mg/dL (ref 8–27)
Bilirubin Total: 0.6 mg/dL (ref 0.0–1.2)
CO2: 27 mmol/L (ref 20–29)
Calcium: 9.5 mg/dL (ref 8.6–10.2)
Chloride: 103 mmol/L (ref 96–106)
Creatinine, Ser: 0.89 mg/dL (ref 0.76–1.27)
Globulin, Total: 2.8 g/dL (ref 1.5–4.5)
Glucose: 82 mg/dL (ref 70–99)
Potassium: 4.4 mmol/L (ref 3.5–5.2)
Sodium: 142 mmol/L (ref 134–144)
Total Protein: 7.4 g/dL (ref 6.0–8.5)
eGFR: 88 mL/min/{1.73_m2} (ref >59)

## 2022-02-16 LAB — LIPID PANEL
Chol/HDL Ratio: 3.4 ratio (ref 0.0–5.0)
Cholesterol, Total: 219 mg/dL — ABNORMAL HIGH (ref 100–199)
HDL: 65 mg/dL (ref >39)
LDL Chol Calc (NIH): 135 mg/dL — ABNORMAL HIGH (ref 0–99)
Triglycerides: 110 mg/dL (ref 0–149)
VLDL Cholesterol Cal: 19 mg/dL (ref 5–40)

## 2022-02-16 NOTE — Progress Notes (Signed)
Remote ICD transmission.   

## 2022-02-20 ENCOUNTER — Telehealth: Payer: Self-pay | Admitting: Cardiology

## 2022-02-20 ENCOUNTER — Other Ambulatory Visit: Payer: Self-pay

## 2022-02-20 DIAGNOSIS — E785 Hyperlipidemia, unspecified: Secondary | ICD-10-CM

## 2022-02-20 MED ORDER — ATORVASTATIN CALCIUM 80 MG PO TABS
80.0000 mg | ORAL_TABLET | Freq: Every day | ORAL | 3 refills | Status: DC
  Filled 2022-02-20: qty 30, 30d supply, fill #0

## 2022-02-20 NOTE — Telephone Encounter (Signed)
Vincent M Martinique, MD  02/17/2022 11:16 AM EDT     The following abnormalities are noted:  chemistries are normal. LDL 135 is not at goal < 70.  All other values are normal, stable or within acceptable limits. Medication changes / Follow up labs / Other changes or recommendations:   Recommend he increase lipitor to 80 mg daily. Let us know if he has any difficulty taking his meds. Repeat LFTs and lipids in 3 months     Spoke with pt regarding recent lab results and recommendations per Dr. Martinique. New prescription sent to pt's pharmacy of choice. Lab orders placed and mailed to pt's home address. Pt reports otherwise good blood pressures recently and that he is walking daily getting to an average of 6-7 thousand steps a day. Pt verbalizes understanding.

## 2022-02-20 NOTE — Telephone Encounter (Signed)
Calling in regards to test results. Please advis e

## 2022-02-21 ENCOUNTER — Other Ambulatory Visit: Payer: Self-pay

## 2022-02-27 ENCOUNTER — Ambulatory Visit (INDEPENDENT_AMBULATORY_CARE_PROVIDER_SITE_OTHER): Payer: Self-pay | Admitting: Pharmacist Clinician (PhC)/ Clinical Pharmacy Specialist

## 2022-02-27 VITALS — BP 167/79 | HR 71 | Ht 68.5 in | Wt 148.0 lb

## 2022-02-27 DIAGNOSIS — I5022 Chronic systolic (congestive) heart failure: Secondary | ICD-10-CM

## 2022-02-27 DIAGNOSIS — I5042 Chronic combined systolic (congestive) and diastolic (congestive) heart failure: Secondary | ICD-10-CM

## 2022-02-27 DIAGNOSIS — I10 Essential (primary) hypertension: Secondary | ICD-10-CM

## 2022-02-27 MED ORDER — SACUBITRIL-VALSARTAN 97-103 MG PO TABS: 1.0000 | ORAL_TABLET | Freq: Two times a day (BID) | ORAL | 3 refills | Status: DC

## 2022-02-27 NOTE — Progress Notes (Signed)
02/28/2022 [name] 1941-08-25 [id number]   HPI:  [name] is a 81 y.o. male patient of Dr [place], with a PMH below who presents today for heart failure medication titration.   An echocardiogram last month showed his EF estimated at 35%.  Dr. Sw[place]iland saw him shortly after that to optimize medication therapy.   Patient noted at that visit that he had stopped taking spironolactone, as it caused him to be sweaty and feel bad.    Dr. Sw[place]iland stopped his losartan and started him on Entresto 49/51 mg twice daily.    Today he is in the office for medication titration.  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, albuterol MDI     Blood Pressure Goal:  130/80  Current Medications: Entresto 49/51 mg bid, metoprolol tart 100 mg bid  Family Hx: neither parent had heart issues, both died in sleep - old age, mom 15, dad 68; 8 siblings, 5 deceased, none with known heart disease; one son, no heart issues yet  Social Hx: former smoker, former drinker - quit drinking 40 years ago with birth of first grandchild ; smoked off/on until COPD diagnosis  Diet: mix of eating home and out, likes [employer] or [employer]i Slicker; some vegetables - usually frozen, sometimes fresh  Exercise: some calesthenics (push ups, sit ups), 5000-7000 steps per day  Home BP readings: not sure where home cuff is  Intolerances: asa - GI upset at higher doses; spironolactone - sweating, feeling poorly  Labs: 02/16/22:  Na 142, K 4.4, Glu 82, BUN 8, SCr 0.89, GFR 88   Wt Readings from Last 3 Encounters:  02/27/22 148 lb (67.1 kg)  02/02/22 148 lb (67.1 kg)  12/15/21 148 lb (67.1 kg)   BP Readings from Last 3 Encounters:  02/27/22 (!) 167/79  02/02/22 134/68  12/08/21 (!) 190/77    Pulse Readings from Last 3 Encounters:  02/27/22 71  02/02/22 77  12/08/21 66    Current Outpatient Medications  Medication Sig Dispense Refill   acetaminophen (TYLENOL) 650 MG CR tablet Take 650 mg by mouth every 8 (eight) hours as needed for pain.     albuterol (VENTOLIN HFA) 108 (90 Base) MCG/ACT inhaler Inhale 1-2 puffs into the lungs every 6 (six) hours as needed for wheezing or shortness of breath. 18 g 1   amLODipine (NORVASC) 5 MG tablet Take 1 tablet (5 mg total) by mouth in the morning and at bedtime. 30 tablet 3   atorvastatin (LIPITOR) 80 MG tablet Take 1 tablet (80 mg total) by mouth once daily. 90 tablet 3   budesonide-formoterol (SYMBICORT) 160-4.5 MCG/ACT inhaler INHALE 2 PUFFS BY MOUTH TWICE A DAY 10.2 g 3   clopidogrel (PLAVIX) 75 MG tablet TAKE 1 TABLET DAILY. 90 tablet 3   isosorbide mononitrate (IMDUR) 60 MG 24 hr tablet Take 1 tablet (60 mg total) by mouth daily. 90 tablet 2   metoprolol tartrate (LOPRESSOR) 100 MG tablet Take 1 tablet (100 mg total) by mouth 2 (two) times daily. 180 tablet 1   nitroGLYCERIN (NITROSTAT) 0.4 MG SL tablet Place 1 tablet (0.4 mg total) under the tongue every 5 (five) minutes as needed for chest pain. 25 tablet 3  sacubitril-valsartan (ENTRESTO) 97-103 MG Take 1 tablet by mouth 2 (two) times daily. 180 tablet 3   potassium chloride SA (K-DUR,KLOR-CON) 20 MEQ tablet Take 40 mEq by mouth daily. (Patient not taking: Reported on 02/27/2022)     No current facility-administered medications for this visit.    Allergies  Allergen Reactions   Acyclovir And Related     GI upset    Aspirin Other (See Comments)    GI upset at high doses only.    Past Medical History:  Diagnosis Date   Acute renal failure (HCC) 05/31/2014   Angina decubitus (HCC) 06/15/2014   CAD (coronary artery disease)    a. s/p stent RCA DES 9/09; b. 2014 Attempted PCI of OM1 @ [place] [facility];  c. 04/2014 Cath: LAD 40-50p, D1 95-99 (chronic), LCX 30-40 inf branch, OM1  CTO, RCA 30-40p, RCA patent stent, EF 35%->Med Rx.   Chronic combined systolic and diastolic CHF (congestive heart failure) (HCC)    a. EF about 40 to 45% per echo in April 2013;  b. 04/2014 Echo: EF 20-25%, sev LVH, sev glob HK, Gr 1 DD, mildly reduced RV fxn, PASP . c. 01/2017: EF improved to 50-55%.    Chronic low back pain    Complete heart block (HCC)    COPD (chronic obstructive pulmonary disease) (HCC) dx 06/2013   PFTs 07/08/13: mod obst with resp to bronchodilator, moderate decrease diffusion, airtrapping   COPD with acute exacerbation (HCC) 01/17/2017   COPD with asthma (HCC)    07/08/13 PFT: FEV1 1.74L (66% pred, 30% change with BD), mod obst with resp to bronchodilator, moderate decrease diffusion, air-trapping 11/2013 Simple spiro>> clear obstruction, FEV1 1.30 L (47% pred) - trial of symbicort 160 2bid 01/26/15      Depression 09/15/2018   Assessment: Increased sadness and depression since losing job  Plan: Patient denies suicidal and homicidal ideations Patient would not like to start medication therapies at this time Will establish patient with [facility] primary care   Dyslipidemia    a. on statin   Essential hypertension    HLD (hyperlipidemia)    HTN (hypertension)    a. Reports intolerance to hydralazine; b. no beta blockers 2/2 bradycardia;  c. failed on ACE and ARB.   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 04/2014 Echo: EF 20-25%, sev glob HK.   Mixed Ischemic/Non-Ischemic Cardiomyopathy    probable mixed ischemic and non-ischemic    Noncompliance    OSA (obstructive sleep apnea) 04/18/2018   06/11/16 - home sleep study shows AHI of 2.9 an hour with the lowest SaO2 of 79% with an average of 93%  03/08/2018-Home sleep study-AHI 7/HR, SaO2 low 81%    Paroxysmal atrial fibrillation (HCC)    a.  identified on device interrogation 01/2016   Pneumonia 09/02/2013   Pulmonary edema 05/10/2014   Respiratory arrest (HCC) 05/18/2014   Sinus bradycardia 05/31/2014   Ventricular tachycardia (HCC)    treated with ATP,  CL 250-300 msec    Blood pressure (!) 167/79, pulse 71, height 5' 8.5" (1.74 m), weight 148 lb (67.1 kg).  Chronic combined systolic and diastolic CHF (congestive heart failure) (HCC) Patient with HFrEF at 35% by echo just last month.  Currently on Entresto and metoprolol.  Did not tolerate spironolactone.  Cost is an issue with multiple branded medications, so  not on SGLT2 at this time.  Will increase Entresto to 97/103 today and reviewed GDMT in relation to adding SGLT2.  Suspect that he would be able to get [place] through patient assistance. He is scheduled to see Edd Fabian in 2 months and will see if patient willing to start and do patient assistance at that time.  He will need to repeat BMET in 2 weeks   Phillips Hay PharmD CPP [facility] [address] [place], [place] [address] [phone]

## 2022-02-27 NOTE — Patient Instructions (Signed)
Return for a a follow up appointment August 21 with Edd Fabian at [facility]  Go to the lab 2 weeks after you increase the Entresto dose.  Take your BP meds as follows:  Increase Entresto to 97/103 mg twice daily  Continue with all other medications.   Bring all of your meds, your BP cuff and your record of home blood pressures to your next appointment.  Exercise as you're able, try to walk approximately 30 minutes per day.  Keep salt intake to a minimum, especially watch canned and prepared boxed foods.  Eat more fresh fruits and vegetables and fewer canned items.  Avoid eating in fast food restaurants.    HOW TO TAKE YOUR BLOOD PRESSURE: Rest 5 minutes before taking your blood pressure.  Don't smoke or drink caffeinated beverages for at least 30 minutes before. Take your blood pressure before (not after) you eat. Sit comfortably with your back supported and both feet on the floor (don't cross your legs). Elevate your arm to heart level on a table or a desk. Use the proper sized cuff. It should fit smoothly and snugly around your bare upper arm. There should be enough room to slip a fingertip under the cuff. The bottom edge of the cuff should be 1 inch above the crease of the elbow. Ideally, take 3 measurements at one sitting and record the average.

## 2022-02-28 ENCOUNTER — Encounter: Payer: Self-pay | Admitting: Pharmacist Clinician (PhC)/ Clinical Pharmacy Specialist

## 2022-02-28 NOTE — Assessment & Plan Note (Addendum)
Patient with HFrEF at 35% by echo just last month.  Currently on Entresto and metoprolol.  Did not tolerate spironolactone.  Cost is an issue with multiple branded medications, so not on SGLT2 at this time.  Will increase Entresto to 97/103 today and reviewed GDMT in relation to adding SGLT2.  Suspect that he would be able to get [place] through patient assistance. He is scheduled to see Edd Fabian in 2 months and will see if patient willing to start and do patient assistance at that time.  He will need to repeat BMET in 2 weeks

## 2022-03-12 ENCOUNTER — Other Ambulatory Visit: Payer: Self-pay

## 2022-03-12 ENCOUNTER — Telehealth: Payer: Self-pay

## 2022-03-12 MED ORDER — FLUTICASONE FUROATE-VILANTEROL 200-25 MCG/ACT IN AEPB
1.0000 | INHALATION_SPRAY | Freq: Every day | RESPIRATORY_TRACT | 2 refills | Status: DC
Start: 2022-03-12 — End: 2022-07-11
  Filled 2022-03-12: qty 60, 30d supply, fill #0

## 2022-03-12 NOTE — Telephone Encounter (Signed)
Patient does not qualify for patient assistance at this time for Symbicort.  Due to cost, can his therapy be changed to Advair Diskus 100/50 or 500/50, or Breo?

## 2022-03-12 NOTE — Telephone Encounter (Signed)
Thanks Luke!

## 2022-03-12 NOTE — Telephone Encounter (Signed)
Done. Changed to Breo per [employer] policy.

## 2022-03-12 NOTE — Telephone Encounter (Signed)
Pt called to see if one of the alternatives would be sent to the pharmacy today/ please advise

## 2022-03-14 ENCOUNTER — Other Ambulatory Visit: Payer: Self-pay

## 2022-03-19 ENCOUNTER — Other Ambulatory Visit: Payer: Self-pay

## 2022-03-21 ENCOUNTER — Other Ambulatory Visit: Payer: Self-pay

## 2022-03-21 ENCOUNTER — Other Ambulatory Visit: Payer: Self-pay | Admitting: Cardiology

## 2022-03-21 ENCOUNTER — Telehealth: Payer: Self-pay

## 2022-03-21 NOTE — Telephone Encounter (Signed)
[facility].  If appropriate, please resend script to [facility] [address] [place], [place], [place]  [address]. [phone], (F) [phone]

## 2022-03-22 MED ORDER — SACUBITRIL-VALSARTAN 97-103 MG PO TABS: 1.0000 | ORAL_TABLET | Freq: Two times a day (BID) | ORAL | 3 refills | Status: DC

## 2022-03-22 NOTE — Addendum Note (Signed)
Addended by: Neoma Laming on: 03/22/2022 03:56 PM   Modules accepted: Orders

## 2022-03-22 NOTE — Telephone Encounter (Signed)
Entresto refill sent to mail order [facility].

## 2022-03-23 ENCOUNTER — Other Ambulatory Visit: Payer: Self-pay

## 2022-03-26 ENCOUNTER — Other Ambulatory Visit: Payer: Self-pay

## 2022-03-28 ENCOUNTER — Telehealth: Payer: Self-pay

## 2022-03-28 NOTE — Telephone Encounter (Signed)
Patient appearing on report for [employer] - Hypertension Control report due to last documented ambulatory blood pressure of 167/79 mHg on 02/27/22. Next appointment with PCP is not scheduled.    Outreached patient to discuss hypertension control and medication management. Left voicemail for patient to return my call at their convenience.   Valeda Malm, Pharm.D. PGY-2 Ambulatory Care Pharmacy Resident 03/28/2022 1:36 PM

## 2022-03-30 ENCOUNTER — Other Ambulatory Visit: Payer: Self-pay

## 2022-04-24 ENCOUNTER — Ambulatory Visit: Payer: Self-pay | Admitting: Cardiology

## 2022-04-27 NOTE — Progress Notes (Deleted)
Cardiology Clinic Note   Patient Name: [name] Date of Encounter: 04/29/2022  Primary Care Provider:  Claiborne Rigg, NP Primary Cardiologist:  Peter Swaziland, MD  Patient Profile    [name] 81 year old male presents the clinic today for follow-up evaluation of his essential hypertension and coronary artery disease.  Past Medical History    Past Medical History:  Diagnosis Date   Acute renal failure (HCC) 05/31/2014   Angina decubitus (HCC) 06/15/2014   CAD (coronary artery disease)    a. s/p stent RCA DES 9/09; b. 2014 Attempted PCI of OM1 @ [place] [facility];  c. 04/2014 Cath: LAD 40-50p, D1 95-99 (chronic), LCX 30-40 inf branch, OM1 CTO, RCA 30-40p, RCA patent stent, EF 35%->Med Rx.   Chronic combined systolic and diastolic CHF (congestive heart failure) (HCC)    a. EF about 40 to 45% per echo in April 2013;  b. 04/2014 Echo: EF 20-25%, sev LVH, sev glob HK, Gr 1 DD, mildly reduced RV fxn, PASP . c. 01/2017: EF improved to 50-55%.    Chronic low back pain    Complete heart block (HCC)    COPD (chronic obstructive pulmonary disease) (HCC) dx 06/2013   PFTs 07/08/13: mod obst with resp to bronchodilator, moderate decrease diffusion, airtrapping   COPD with acute exacerbation (HCC) 01/17/2017   COPD with asthma (HCC)    07/08/13 PFT: FEV1 1.74L (66% pred, 30% change with BD), mod obst with resp to bronchodilator, moderate decrease diffusion, air-trapping 11/2013 Simple spiro>> clear obstruction, FEV1 1.30 L (47% pred) - trial of symbicort 160 2bid 01/26/15      Depression 09/15/2018   Assessment: Increased sadness and depression since losing job  Plan: Patient denies suicidal and homicidal ideations Patient would not like to start medication therapies at this time Will establish patient with community health and wellness for primary care   Dyslipidemia    a. on statin   Essential hypertension    HLD (hyperlipidemia)    HTN (hypertension)    a. Reports intolerance to  hydralazine; b. no beta blockers 2/2 bradycardia;  c. failed on ACE and ARB.   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 04/2014 Echo: EF 20-25%, sev glob HK.   Mixed Ischemic/Non-Ischemic Cardiomyopathy    probable mixed ischemic and non-ischemic    Noncompliance    OSA (obstructive sleep apnea) 04/18/2018   06/11/16 - home sleep study shows AHI of 2.9 an hour with the lowest SaO2 of 79% with an average of 93%  03/08/2018-Home sleep study-AHI 7/HR, SaO2 low 81%    Paroxysmal atrial fibrillation (HCC)    a. identified on device interrogation 01/2016   Pneumonia 09/02/2013   Pulmonary edema 05/10/2014   Respiratory arrest (HCC) 05/18/2014   Sinus bradycardia 05/31/2014   Ventricular tachycardia (HCC)    treated with ATP,  CL 250-300 msec   Past Surgical History:  Procedure Laterality Date   BI-VENTRICULAR IMPLANTABLE CARDIOVERTER DEFIBRILLATOR N/A 08/12/2014   MDT Ovidio Kin XT CRTD implanted by Dr Johney Frame   BIV ICD GENERATOR CHANGEOUT N/A 08/08/2021   Procedure: BIV ICD GENERATOR CHANGEOUT;  Surgeon: Hillis Range, MD;  Location: [facility] INVASIVE CV LAB;  Service: Cardiovascular;  Laterality: N/A;   CARDIAC CATHETERIZATION     ejection fraction 50%   LEFT HEART CATHETERIZATION WITH CORONARY ANGIOGRAM N/A 05/08/2014  Procedure: LEFT HEART CATHETERIZATION WITH CORONARY ANGIOGRAM;  Surgeon: Micheline Chapman, MD;  Location: [facility] CATH LAB;  Service: Cardiovascular;  Laterality: N/A;    Allergies  Allergies  Allergen Reactions   Acyclovir And Related     GI upset    Aspirin Other (See Comments)    GI upset at high doses only.    History of Present Illness    [name] is a PMH of essential hypertension, coronary artery disease, LV dysfunction, NSVT, chronic combined systolic and diastolic CHF, ischemic cardiomyopathy, PE, COPD, OSA,  hyperlipidemia, fatigue, syncope, and depression.  His echocardiogram 4/13 showed LV dysfunction 40-45%.  He is limited in beta-blocker therapy due to bradycardia.  Nuclear stress test 3/13 showed normal perfusion and ejection fraction of 40%.  He was [facility] to [facility] 12/14 with chest pain.  He underwent cardiac catheterization which showed patent RCA stent and mid LAD stenosis 50%.  His first diagonal and first OM were occluded.  Attempted PCI of his OM was unable to cross.  With review of his old films that showed chronic occlusion.  He was admitted 8/15 with VDRF and acute pulmonary edema.  His echocardiogram showed an EF of 20-25%.  8/15 he was admitted with syncope associated with bradycardia and overdiuresis.  His diuretics were reduced and his carvedilol was held.  Repeat echo 11/15 showed an EF of 30%.  He underwent ICD/BiV implant by Dr. Johney Frame.  In 2017 he complained of dyspnea on exertion.  He was not felt to have CHF.  No clinical signs.  His echocardiogram showed an EF of 45-50%, global hypokinesis, mild MR.  He underwent stress testing which showed fixed inferior lateral infarct without ischemia.  His EF was felt to be 39%.  His echocardiogram in 2019 showed an EF of 45% and moderate LV function.  He was seen again in follow-up 5/21 and noted more shortness of breath.  His chest x-ray was clear.  His BNP 118, echocardiogram showed improved EF of 50-55% and no significant valvular disease.  He was seen in follow-up by Dr. Swaziland on 02/02/2022.  During that time his echocardiogram was reviewed which showed an EF of 30-35%.  Optimizing medical therapy was discussed.  He had stopped taking his Aldactone due to diaphoresis and an overall bad feeling.  He denied increased shortness of breath or angina.  He had no lower extremity swelling.  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  He was seen by cardiac clinical pharmacist on 02/27/2022.  During that time his blood pressure was 167/79.  His  Entresto was increased to 97/103.  It was felt that he may be able to get [place] through patient assistance with plan for initiation of follow-up.  BMP was scheduled but not completed.  He presents to the clinic today for follow-up evaluation states***  Today he denies chest pain, shortness of breath, lower extremity edema, fatigue, palpitations, melena, hematuria, hemoptysis, diaphoresis, weakness, presyncope, syncope, orthopnea, and PND.  Chronic systolic and diastolic CHF, ischemic cardiomyopathy-euvolemic today.  No increased DOE or activity intolerance.  Status post BiV/ICD.  11/15.  Did not tolerate beta-blocker therapy due to bradycardia and did not tolerate spironolactone due to diaphoresis. Continue [employer] Farxiga 10 mg daily Heart healthy low-sodium diet-salty 6 given Increase physical activity as tolerated Order BMP today and in 2 weeks  Essential hypertension-BP today*** Continue amlodipine, Entresto Heart healthy low-sodium diet-salty 6 given Increase physical activity as tolerated  Coronary artery disease-no chest pain today.  Cardiac catheterization 12/14 at [facility] showed patent RCA stent, 50% mid LAD lesion, first diagonal and OM1 with chronic occlusion. Continue amlodipine, atorvastatin Heart healthy low-sodium diet-salty 6 given Increase physical activity as tolerated   Disposition: Follow-up with Dr. Swaziland or me in 2-3 months.  Home Medications    Prior to Admission medications   Medication Sig Start Date End Date Taking? Authorizing Provider  acetaminophen (TYLENOL) 650 MG CR tablet Take 650 mg by mouth every 8 (eight) hours as needed for pain.    [provider]  albuterol (VENTOLIN HFA) 108 (90 Base) MCG/ACT inhaler Inhale 1-2 puffs into the lungs every 6 (six) hours as needed for wheezing or shortness of breath. 11/13/21   Claiborne Rigg, NP  amLODipine (NORVASC) 5 MG tablet Take 1 tablet (5 mg total) by mouth in the morning and  at bedtime. 12/08/21   Hoy Register, MD  atorvastatin (LIPITOR) 80 MG tablet Take 1 tablet (80 mg total) by mouth once daily. 02/20/22   Swaziland, Peter M, MD  clopidogrel (PLAVIX) 75 MG tablet TAKE 1 TABLET DAILY. 11/08/21   Sheilah Pigeon, PA-C  fluticasone furoate-vilanterol (BREO ELLIPTA) 200-25 MCG/ACT AEPB Inhale 1 puff into the lungs daily. 03/12/22   Hoy Register, MD  isosorbide mononitrate (IMDUR) 60 MG 24 hr tablet Take 1 tablet (60 mg total) by mouth daily. 01/11/20   Swaziland, Peter M, MD  metoprolol tartrate (LOPRESSOR) 100 MG tablet Take 1 tablet (100 mg total) by mouth 2 (two) times daily. 02/07/21   Sheilah Pigeon, PA-C  nitroGLYCERIN (NITROSTAT) 0.4 MG SL tablet Place 1 tablet (0.4 mg total) under the tongue every 5 (five) minutes as needed for chest pain. 12/08/21 04/27/24  Hoy Register, MD  potassium chloride SA (K-DUR,KLOR-CON) 20 MEQ tablet Take 40 mEq by mouth daily. Patient not taking: Reported on 02/27/2022    [provider]  sacubitril-valsartan (ENTRESTO) 97-103 MG Take 1 tablet by mouth 2 (two) times daily. 03/22/22   Swaziland, Peter M, MD    Family History    Family History  Problem Relation Age of Onset   Heart attack Neg Hx    Stroke Neg Hx    He indicated that his mother is deceased. He indicated that his father is deceased. He indicated that the status of his neg hx is unknown.  Social History    Social History   Socioeconomic History   Marital status: Married    Spouse name: Not on file   Number of children: 3   Years of education: Not on file   Highest education level: Not on file  Occupational History   Occupation: security guard.  Tobacco Use   Smoking status: Former    Packs/day: 0.50    Years: 56.00    Total pack years: 28.00    Types: Cigarettes    Quit date: 09/10/2012    Years since quitting: 9.6   Smokeless tobacco: Never   Tobacco comments:    tried 1 month ago (05/2018) but didn't like it   Vaping Use   Vaping Use: Never used   Substance and Sexual Activity   Alcohol use: No   Drug use: No   Sexual activity: Not Currently  Other Topics Concern   Not on file  Social History Narrative          Social Determinants of Health   Financial Resource Strain: Not on file  Food Insecurity: Not on file  Transportation Needs: Not on file  Physical Activity: Not on file  Stress: Not on file  Social Connections: Not on file  Intimate Partner Violence: Not on file     Review of Systems    General:  No chills, fever, night sweats or weight changes.  Cardiovascular:  No chest pain, dyspnea on exertion, edema, orthopnea, palpitations, paroxysmal nocturnal dyspnea. Dermatological: No rash, lesions/masses Respiratory: No cough, dyspnea Urologic: No hematuria, dysuria Abdominal:   No nausea, vomiting, diarrhea, bright red blood per rectum, melena, or hematemesis Neurologic:  No visual changes, wkns, changes in mental status. All other systems reviewed and are otherwise negative except as noted above.  Physical Exam    VS:  There were no vitals taken for this visit. , BMI There is no height or weight on file to calculate BMI. GEN: Well nourished, well developed, in no acute distress. HEENT: normal. Neck: Supple, no JVD, carotid bruits, or masses. Cardiac: RRR, no murmurs, rubs, or gallops. No clubbing, cyanosis, edema.  Radials/DP/PT 2+ and equal bilaterally.  Respiratory:  Respirations regular and unlabored, clear to auscultation bilaterally. GI: Soft, nontender, nondistended, BS + x 4. MS: no deformity or atrophy. Skin: warm and dry, no rash. Neuro:  Strength and sensation are intact. Psych: Normal affect.  Accessory Clinical Findings    Recent Labs: 11/13/2021: Hemoglobin 12.3; Platelets 171 02/16/2022: ALT 8; BUN 8; Creatinine, Ser 0.89; Potassium 4.4; Sodium 142   Recent Lipid Panel    Component Value Date/Time   CHOL 219 (H) 02/16/2022 0853   TRIG 110 02/16/2022 0853   HDL 65 02/16/2022 0853   CHOLHDL  3.4 02/16/2022 0853   CHOLHDL 2.9 11/29/2015 0908   VLDL 17 11/29/2015 0908   LDLCALC 135 (H) 02/16/2022 0853   LDLDIRECT 132.9 06/18/2013 1038    No BP recorded.  {Refresh Note OR Click here to enter BP  :1}***    ECG personally reviewed by me today- *** - No acute changes  Echocardiogram 01/15/2022  IMPRESSIONS     1. Left ventricular ejection fraction, by estimation, is 35%. The left  ventricle has moderately decreased function. The left ventricle  demonstrates regional wall motion abnormalities with basal to mid  inferolateral and anterolateral severe hypokinesis  to akinesis. There is severe left ventricular hypertrophy. The LVH is  impressive, ?forme fruste of hypertrophic cardiomyopathy. Left ventricular  diastolic parameters are consistent with Grade I diastolic dysfunction  (impaired relaxation).   2. Right ventricular systolic function is mildly reduced. The right  ventricular size is normal. Tricuspid regurgitation signal is inadequate  for assessing PA pressure.   3. Left atrial size was mildly dilated.   4. The mitral valve is normal in structure. Trivial mitral valve  regurgitation. No evidence of mitral stenosis.   5. The aortic valve is tricuspid. There is mild calcification of the  aortic valve. Aortic valve regurgitation is not visualized. No aortic  stenosis is present.   6. The inferior vena cava is normal in size with greater than 50%  respiratory variability, suggesting right atrial pressure of 3 mmHg.  Assessment & Plan   1.  ***   Thomasene Ripple. Ednah Hammock NP-C     04/29/2022, 3:41 PM [facility] [address] Office [phone] Fax [phone]  Notice: This dictation was prepared with Dragon dictation along with smaller phrase technology. Any transcriptional errors that result from this process are unintentional and may not be corrected upon review.  I spent***minutes examining this patient, reviewing  medications, and using patient centered  shared decision making involving her cardiac care.  Prior to her visit I spent greater than 20 minutes reviewing her past medical history,  medications, and prior cardiac tests.

## 2022-04-30 ENCOUNTER — Ambulatory Visit: Payer: Self-pay | Admitting: General Practice

## 2022-05-11 ENCOUNTER — Ambulatory Visit (INDEPENDENT_AMBULATORY_CARE_PROVIDER_SITE_OTHER): Payer: Self-pay

## 2022-05-11 DIAGNOSIS — I255 Ischemic cardiomyopathy: Secondary | ICD-10-CM

## 2022-05-15 LAB — CUP PACEART REMOTE DEVICE CHECK
Battery Remaining Longevity: 62 mo
Battery Voltage: 2.97 V
Brady Statistic AP VP Percent: 43.88 %
Brady Statistic AP VS Percent: 0.13 %
Brady Statistic AS VP Percent: 55.02 %
Brady Statistic AS VS Percent: 0.98 %
Brady Statistic RA Percent Paced: 42.49 %
Brady Statistic RV Percent Paced: 96.77 %
Date Time Interrogation Session: 20230901224225
HighPow Impedance: 62 Ohm
Implantable Lead Implant Date: 20151203
Implantable Lead Implant Date: 20151203
Implantable Lead Implant Date: 20151203
Implantable Lead Location: 753858
Implantable Lead Location: 753859
Implantable Lead Location: 753860
Implantable Lead Model: 4598
Implantable Lead Model: 5076
Implantable Lead Model: 6935
Implantable Pulse Generator Implant Date: 20221129
Lead Channel Impedance Value: 152 Ohm
Lead Channel Impedance Value: 160.941
Lead Channel Impedance Value: 160.941
Lead Channel Impedance Value: 160.941
Lead Channel Impedance Value: 171 Ohm
Lead Channel Impedance Value: 247 Ohm
Lead Channel Impedance Value: 304 Ohm
Lead Channel Impedance Value: 304 Ohm
Lead Channel Impedance Value: 304 Ohm
Lead Channel Impedance Value: 342 Ohm
Lead Channel Impedance Value: 342 Ohm
Lead Channel Impedance Value: 361 Ohm
Lead Channel Impedance Value: 399 Ohm
Lead Channel Impedance Value: 532 Ohm
Lead Channel Impedance Value: 532 Ohm
Lead Channel Impedance Value: 551 Ohm
Lead Channel Impedance Value: 551 Ohm
Lead Channel Impedance Value: 551 Ohm
Lead Channel Pacing Threshold Amplitude: 0.625 V
Lead Channel Pacing Threshold Amplitude: 1.125 V
Lead Channel Pacing Threshold Amplitude: 1.625 V
Lead Channel Pacing Threshold Pulse Width: 0.4 ms
Lead Channel Pacing Threshold Pulse Width: 0.4 ms
Lead Channel Pacing Threshold Pulse Width: 0.4 ms
Lead Channel Sensing Intrinsic Amplitude: 0.75 mV
Lead Channel Sensing Intrinsic Amplitude: 0.75 mV
Lead Channel Sensing Intrinsic Amplitude: 17.25 mV
Lead Channel Sensing Intrinsic Amplitude: 17.25 mV
Lead Channel Setting Pacing Amplitude: 1.5 V
Lead Channel Setting Pacing Amplitude: 2.25 V
Lead Channel Setting Pacing Amplitude: 3.5 V
Lead Channel Setting Pacing Pulse Width: 0.4 ms
Lead Channel Setting Pacing Pulse Width: 0.4 ms
Lead Channel Setting Sensing Sensitivity: 0.3 mV

## 2022-05-25 ENCOUNTER — Other Ambulatory Visit: Payer: Self-pay

## 2022-05-30 ENCOUNTER — Encounter (HOSPITAL_COMMUNITY): Payer: Self-pay | Admitting: Emergency Medicine

## 2022-05-30 ENCOUNTER — Other Ambulatory Visit: Payer: Self-pay

## 2022-05-30 ENCOUNTER — Ambulatory Visit (HOSPITAL_COMMUNITY)
Admission: EM | Admit: 2022-05-30 | Discharge: 2022-05-30 | Disposition: A | Discharge status: Home / Self Care | Payer: Self-pay | Attending: Family Medicine | Admitting: Family Medicine

## 2022-05-30 ENCOUNTER — Ambulatory Visit (INDEPENDENT_AMBULATORY_CARE_PROVIDER_SITE_OTHER): Payer: Self-pay

## 2022-05-30 DIAGNOSIS — R059 Cough, unspecified: Secondary | ICD-10-CM

## 2022-05-30 DIAGNOSIS — I10 Essential (primary) hypertension: Secondary | ICD-10-CM

## 2022-05-30 DIAGNOSIS — R051 Acute cough: Secondary | ICD-10-CM

## 2022-05-30 DIAGNOSIS — J441 Chronic obstructive pulmonary disease with (acute) exacerbation: Secondary | ICD-10-CM

## 2022-05-30 MED ORDER — DOXYCYCLINE HYCLATE 100 MG PO TABS
100.0000 mg | ORAL_TABLET | Freq: Two times a day (BID) | ORAL | 0 refills | Status: DC
  Filled 2022-05-30: qty 20, 10d supply, fill #0

## 2022-05-30 MED ORDER — DOXYCYCLINE HYCLATE 100 MG PO CAPS: 100.0000 mg | ORAL_CAPSULE | Freq: Two times a day (BID) | ORAL | 0 refills | Status: DC

## 2022-05-30 MED ORDER — BENZONATATE 100 MG PO CAPS: ORAL_CAPSULE | ORAL | 0 refills | Status: DC

## 2022-05-30 MED ORDER — PREDNISONE 20 MG PO TABS
40.0000 mg | ORAL_TABLET | Freq: Every day | ORAL | 0 refills | Status: DC
  Filled 2022-05-30: qty 10, 5d supply, fill #0

## 2022-05-30 MED ORDER — BENZONATATE 100 MG PO CAPS
100.0000 mg | ORAL_CAPSULE | Freq: Three times a day (TID) | ORAL | 0 refills | Status: DC | PRN
  Filled 2022-05-30: qty 21, 7d supply, fill #0

## 2022-05-30 NOTE — Discharge Instructions (Signed)
Your blood pressure was noted to be elevated during your visit today. If you are currently taking medication for high blood pressure, please ensure you are taking this as directed. If you do not have a history of high blood pressure and your blood pressure remains persistently elevated, you may need to begin taking a medication at some point. You may return here within the next few days to recheck if unable to see your primary care provider or if you do not have a one.  BP (!) 186/81 (BP Location: Right Arm)   Pulse 81   Temp 98.3 F (36.8 C) (Oral)   Resp 20   SpO2 100%   BP Readings from Last 3 Encounters:  05/30/22 (!) 186/81  02/27/22 (!) 167/79  02/02/22 134/68

## 2022-05-30 NOTE — Progress Notes (Signed)
Remote ICD transmission.   

## 2022-05-30 NOTE — ED Triage Notes (Signed)
Pt reports cough, nasal congestion and sharp pain in back when coughing x 3 days. States has been taking Theraflu and Nyquil with no relief.

## 2022-05-31 ENCOUNTER — Other Ambulatory Visit: Payer: Self-pay

## 2022-06-02 NOTE — ED Provider Notes (Signed)
[facility]   [id number] 05/30/22 Arrival Time: 2423  ASSESSMENT & PLAN:  1. Acute cough   2. COPD exacerbation (HCC)   3. Elevated blood pressure reading in office with diagnosis of hypertension    I have personally viewed the imaging studies ordered this visit. Ques subtle RML infiltrate.  Discussed typical duration of viral illnesses. OTC symptom care as needed.  Meds ordered this encounter  Medications   benzonatate (TESSALON) 100 MG capsule    Sig: Take 1 capsule (100 mg total) by mouth every 8 (eight) hours for cough.    Dispense:  21 capsule    Refill:  0   doxycycline (VIBRA-TABS) 100 MG tablet    Sig: Take 1 tablet (100 mg total) by mouth 2 (two) times daily.    Dispense:  20 tablet    Refill:  0   predniSONE (DELTASONE) 20 MG tablet    Sig: Take 2 tablets (40 mg total) by mouth daily.    Dispense:  10 tablet    Refill:  0     Follow-up Information     Gildardo Pounds, NP.   Specialty: Nurse Practitioner Why: If worsening or failing to improve as anticipated. Contact information: [address] [place] [address] [phone]                Agrees to ED eval should symptoms worsen in any way.    Discharge Instructions      Your blood pressure was noted to be elevated during your visit today. If you are currently taking medication for high blood pressure, please ensure you are taking this as directed. If you do not have a history of high blood pressure and your blood pressure remains persistently elevated, you may need to begin taking a medication at some point. You may return here within the next few days to recheck if unable to see your primary care provider or if you do not have a one.  BP (!) 186/81 (BP Location: Right Arm)   Pulse 81   Temp 98.3 F (36.8 C) (Oral)   Resp 20   SpO2 100%   BP Readings from Last 3 Encounters:  05/30/22 (!) 186/81  02/27/22 (!) 167/79  02/02/22 134/68        Reviewed  expectations re: course of current medical issues. Questions answered. Outlined signs and symptoms indicating need for more acute intervention. Understanding verbalized. After Visit Summary given.   SUBJECTIVE: History from: Patient. NELLO CORRO is a 81 y.o. male. Reports: cough, nasal congestion, back pain with coughing; at least 3 days but thinks longer. Ques subj fever/chills. Denies: difficulty breathing. Normal PO intake without n/v/d. Increased blood pressure noted today. Reports that he is treated for HTN. He reports taking medications as instructed, no chest pain on exertion, no dyspnea on exertion, no swelling of ankles, no orthostatic dizziness or lightheadedness, no orthopnea or paroxysmal nocturnal dyspnea, and no palpitations.  OBJECTIVE:  Vitals:   05/30/22 1551  BP: (!) 186/81  Pulse: 81  Resp: 20  Temp: 98.3 F (36.8 C)  TempSrc: Oral  SpO2: 100%    General appearance: alert; no distress Eyes: PERRLA; EOMI; conjunctiva normal HENT: [place]; AT; with nasal congestion Neck: supple  Lungs: speaks full sentences without difficulty; unlabored; bilat mild wheezing Extremities: no edema Skin: warm and dry Neurologic: normal gait Psychological: alert and cooperative; normal mood and affect  Imaging: DG Chest 2 View  Result Date: 05/30/2022 CLINICAL DATA:  Cough.  Back pain EXAM: CHEST - 2 VIEW COMPARISON:  Chest two-view 02/13/2021 FINDINGS: Cardiac enlargement without heart failure. Atherosclerotic aorta. AICD unchanged in position Pulmonary hyperinflation compatible with COPD. Right middle lobe volume loss and density is new from the prior study likely atelectasis. IMPRESSION: COPD Right middle lobe airspace disease likely atelectasis. Question symptoms of pneumonia. Electronically Signed   By: Marlan Palau M.D.   On: 05/30/2022 16:46    Allergies  Allergen Reactions   Acyclovir And Related     GI upset    Aspirin Other (See Comments)    GI upset at high doses  only.    Past Medical History:  Diagnosis Date   Acute renal failure (HCC) 05/31/2014   Angina decubitus (HCC) 06/15/2014   CAD (coronary artery disease)    a. s/p stent RCA DES 9/09; b. 2014 Attempted PCI of OM1 @ [place];  c. 04/2014 Cath: LAD 40-50p, D1 95-99 (chronic), LCX 30-40 inf branch, OM1 CTO, RCA 30-40p, RCA patent stent, EF 35%->Med Rx.   Chronic combined systolic and diastolic CHF (congestive heart failure) (HCC)    a. EF about 40 to 45% per echo in April 2013;  b. 04/2014 Echo: EF 20-25%, sev LVH, sev glob HK, Gr 1 DD, mildly reduced RV fxn, PASP . c. 01/2017: EF improved to 50-55%.    Chronic low back pain    Complete heart block (HCC)    COPD (chronic obstructive pulmonary disease) (HCC) dx 06/2013   PFTs 07/08/13: mod obst with resp to bronchodilator, moderate decrease diffusion, airtrapping   COPD with acute exacerbation (HCC) 01/17/2017   COPD with asthma (HCC)    07/08/13 PFT: FEV1 1.74L (66% pred, 30% change with BD), mod obst with resp to bronchodilator, moderate decrease diffusion, air-trapping 11/2013 Simple spiro>> clear obstruction, FEV1 1.30 L (47% pred) - trial of symbicort 160 2bid 01/26/15      Depression 09/15/2018   Assessment: Increased sadness and depression since losing job  Plan: Patient denies suicidal and homicidal ideations Patient would not like to start medication therapies at this time Will establish patient with community health and wellness for primary care   Dyslipidemia    a. on statin   Essential hypertension    HLD (hyperlipidemia)    HTN (hypertension)    a. Reports intolerance to hydralazine; b. no beta blockers 2/2 bradycardia;  c. failed on ACE and ARB.   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 04/2014 Echo: EF 20-25%, sev glob HK.   Mixed Ischemic/Non-Ischemic Cardiomyopathy     probable mixed ischemic and non-ischemic    Noncompliance    OSA (obstructive sleep apnea) 04/18/2018   06/11/16 - home sleep study shows AHI of 2.9 an hour with the lowest SaO2 of 79% with an average of 93%  03/08/2018-Home sleep study-AHI 7/HR, SaO2 low 81%    Paroxysmal atrial fibrillation (HCC)    a. identified on device interrogation 01/2016   Pneumonia 09/02/2013   Pulmonary edema 05/10/2014   Respiratory arrest (HCC) 05/18/2014   Sinus bradycardia 05/31/2014   Ventricular tachycardia (HCC)    treated with ATP,  CL 250-300 msec   Social History   Socioeconomic History   Marital status: Married    Spouse name: Not on file   Number of children: 3   Years of education: Not on file  Highest education level: Not on file  Occupational History   Occupation: security guard.  Tobacco Use   Smoking status: Former    Packs/day: 0.50    Years: 56.00    Total pack years: 28.00    Types: Cigarettes    Quit date: 09/10/2012    Years since quitting: 9.7   Smokeless tobacco: Never   Tobacco comments:    tried 1 month ago (05/2018) but didn't like it   Vaping Use   Vaping Use: Never used  Substance and Sexual Activity   Alcohol use: No   Drug use: No   Sexual activity: Not Currently  Other Topics Concern   Not on file  Social History Narrative          Social Determinants of Health   Financial Resource Strain: Not on file  Food Insecurity: Not on file  Transportation Needs: Not on file  Physical Activity: Not on file  Stress: Not on file  Social Connections: Not on file  Intimate Partner Violence: Not on file   Family History  Problem Relation Age of Onset   Heart attack Neg Hx    Stroke Neg Hx    Past Surgical History:  Procedure Laterality Date   BI-VENTRICULAR IMPLANTABLE CARDIOVERTER DEFIBRILLATOR N/A 08/12/2014   MDT Ovidio Kin XT CRTD implanted by Dr Johney Frame   BIV ICD GENERATOR CHANGEOUT N/A 08/08/2021   Procedure: BIV ICD GENERATOR CHANGEOUT;  Surgeon: Hillis Range, MD;  Location: [facility] INVASIVE CV LAB;  Service: Cardiovascular;  Laterality: N/A;   CARDIAC CATHETERIZATION     ejection fraction 50%   LEFT HEART CATHETERIZATION WITH CORONARY ANGIOGRAM N/A 05/08/2014   Procedure: LEFT HEART CATHETERIZATION WITH CORONARY ANGIOGRAM;  Surgeon: Micheline Chapman, MD;  Location: [facility] CATH LAB;  Service: Cardiovascular;  Laterality: N/A;     Mardella Layman, MD 06/02/22 1019

## 2022-07-11 ENCOUNTER — Ambulatory Visit: Payer: Self-pay | Attending: Nurse Practitioner | Admitting: Nurse Practitioner

## 2022-07-11 ENCOUNTER — Encounter: Payer: Self-pay | Admitting: Nurse Practitioner

## 2022-07-11 ENCOUNTER — Other Ambulatory Visit: Payer: Self-pay

## 2022-07-11 VITALS — BP 148/76 | HR 90 | Temp 98.1°F | Ht 68.5 in | Wt 149.2 lb

## 2022-07-11 DIAGNOSIS — J40 Bronchitis, not specified as acute or chronic: Secondary | ICD-10-CM

## 2022-07-11 DIAGNOSIS — I10 Essential (primary) hypertension: Secondary | ICD-10-CM

## 2022-07-11 MED ORDER — ALBUTEROL SULFATE (2.5 MG/3ML) 0.083% IN NEBU
2.5000 mg | INHALATION_SOLUTION | Freq: Four times a day (QID) | RESPIRATORY_TRACT | 1 refills | Status: DC | PRN
  Filled 2022-07-11: qty 180, 15d supply, fill #0

## 2022-07-11 MED ORDER — DEXTROMETHORPHAN-GUAIFENESIN 10-100 MG/5ML PO LIQD
10.0000 mL | Freq: Four times a day (QID) | ORAL | 1 refills | Status: DC | PRN
  Filled 2022-07-11: qty 240, 6d supply, fill #0

## 2022-07-11 MED ORDER — AMLODIPINE BESYLATE 10 MG PO TABS
10.0000 mg | ORAL_TABLET | Freq: Every day | ORAL | 1 refills | Status: DC
  Filled 2022-07-11: qty 90, 90d supply, fill #0

## 2022-07-11 MED ORDER — PREDNISONE 20 MG PO TABS
40.0000 mg | ORAL_TABLET | Freq: Every day | ORAL | 0 refills | Status: DC
  Filled 2022-07-11: qty 10, 5d supply, fill #0

## 2022-07-11 MED ORDER — FLUTICASONE-SALMETEROL 230-21 MCG/ACT IN AERO
2.0000 | INHALATION_SPRAY | Freq: Two times a day (BID) | RESPIRATORY_TRACT | 6 refills | Status: DC
  Filled 2022-07-11: qty 12, fill #0

## 2022-07-11 MED ORDER — AZITHROMYCIN 250 MG PO TABS
ORAL_TABLET | ORAL | 0 refills | Status: AC
  Filled 2022-07-11: qty 6, 5d supply, fill #0

## 2022-07-11 MED ORDER — MOMETASONE FURO-FORMOTEROL FUM 200-5 MCG/ACT IN AERO
2.0000 | INHALATION_SPRAY | Freq: Two times a day (BID) | RESPIRATORY_TRACT | 6 refills | Status: DC
  Filled 2022-07-11: qty 13, fill #0

## 2022-07-11 MED ORDER — FLUTICASONE PROPIONATE 50 MCG/ACT NA SUSP
2.0000 | Freq: Every day | NASAL | 0 refills | Status: DC
  Filled 2022-07-11: qty 16, 30d supply, fill #0

## 2022-07-11 NOTE — Progress Notes (Signed)
Assessment & Plan:  [name] was seen today for hypertension.  Diagnoses and all orders for this visit:  Bronchitis -     mometasone-formoterol (DULERA) 200-5 MCG/ACT AERO; Inhale 2 puffs into the lungs 2 (two) times daily. -     azithromycin (ZITHROMAX) 250 MG tablet; Take 2 tablets on day 1, then 1 tablet daily on days 2 through 5 -     predniSONE (DELTASONE) 20 MG tablet; Take 2 tablets (40 mg total) by mouth daily. -     fluticasone (FLONASE) 50 MCG/ACT nasal spray; Place 2 sprays into both nostrils daily. -     dextromethorphan-guaiFENesin (TUSSIN DM) 10-100 MG/5ML liquid; Take 10 mLs by mouth every 6 (six) hours as needed for cough. -     albuterol (PROVENTIL) (2.5 MG/3ML) 0.083% nebulizer solution; Take 3 mLs (2.5 mg total) by nebulization every 6 (six) hours as needed for wheezing or shortness of breath.  Essential hypertension -     amLODipine (NORVASC) 10 MG tablet; Take 1 tablet (10 mg total) by mouth daily.    Patient has been counseled on age-appropriate routine health concerns for screening and prevention. These are reviewed and up-to-date. Referrals have been placed accordingly. Immunizations are up-to-date or declined.    Subjective:   Chief Complaint  Patient presents with   Hypertension    Pt    Hypertension Associated symptoms include shortness of breath. Pertinent negatives include no blurred vision, chest pain, headaches, malaise/fatigue or palpitations.   [name] 81 y.o. male presents to office today for follow up to HTN and Bronchitis   [name] is followed by Cardiology.   [name] has a past medical history of Acute renal failure (05/31/2014), Angina decubitus (06/15/2014), CAD, Chronic combined systolic and diastolic CHF ([place]), Chronic low back pain, Complete heart block, COPD (dx 06/2013), COPD with acute exacerbation (01/17/2017), Depression (09/15/2018), Dyslipidemia, Essential hypertension, HLD, HTN (05/08/2014), LBBB, LV dysfunction (12/13/2011), LVH, Mixed  Ischemic/Non-ischemic Cardiomyopathy, Mixed Ischemic/Non-Ischemic Cardiomyopathy, Noncompliance, OSA (04/18/2018), Paroxysmal atrial fibrillation, Pneumonia (09/02/2013), Pulmonary edema (05/10/2014), Respiratory arrest (05/18/2014), Sinus bradycardia (05/31/2014), and Ventricular tachycardia.   Blood pressure is elevated today. [name] is out of amlodipine which I have refilled for him today.  BP Readings from Last 3 Encounters:  07/11/22 (!) 148/76  05/30/22 (!) 186/81  02/27/22 (!) 167/79    Acute Bronchitis: Patient presents for presents evaluation of chills, nasal congestion, and productive cough with sputum described as mucoid and tenacious. Symptoms began several weeks ago and are gradually worsening since that time.  Past history is significant for COPD and pneumonia.  Notes Breo as ineffective. [name] was on symbicort in the past    Review of Systems  Constitutional:  Negative for fever, malaise/fatigue and weight loss.  HENT:  Positive for congestion. Negative for ear discharge, ear pain, hearing loss, nosebleeds, sinus pain, sore throat and tinnitus.   Eyes: Negative.  Negative for blurred vision, double vision and photophobia.  Respiratory:  Positive for cough, sputum production, shortness of breath and wheezing. Negative for hemoptysis and stridor.   Cardiovascular: Negative.  Negative for chest pain, palpitations and leg swelling.  Gastrointestinal: Negative.  Negative for heartburn, nausea and vomiting.  Musculoskeletal: Negative.  Negative for myalgias.  Neurological: Negative.  Negative for dizziness, focal weakness, seizures and headaches.  Psychiatric/Behavioral: Negative.  Negative for suicidal ideas.     Past Medical History:  Diagnosis Date   Acute renal failure ([place]) 05/31/2014   Angina decubitus 06/15/2014   CAD (coronary artery disease)  a. s/p stent RCA DES 9/09; b. 2014 Attempted PCI of OM1 @ [place] [facility];  c. 04/2014 Cath: LAD 40-50p, D1 95-99 (chronic), LCX 30-40 inf  branch, OM1 CTO, RCA 30-40p, RCA patent stent, EF 35%->Med Rx.   Chronic combined systolic and diastolic CHF (congestive heart failure) (HCC)    a. EF about 40 to 45% per echo in April 2013;  b. 04/2014 Echo: EF 20-25%, sev LVH, sev glob HK, Gr 1 DD, mildly reduced RV fxn, PASP 59mmHg. c. 01/2017: EF improved to 50-55%.    Chronic low back pain    Complete heart block (HCC)    COPD (chronic obstructive pulmonary disease) ([place]) dx 06/2013   PFTs 07/08/13: mod obst with resp to bronchodilator, moderate decrease diffusion, airtrapping   COPD with acute exacerbation ([place]) 01/17/2017   COPD with asthma    07/08/13 PFT: FEV1 1.74L (66% pred, 30% change with BD), mod obst with resp to bronchodilator, moderate decrease diffusion, air-trapping 11/2013 Simple spiro>> clear obstruction, FEV1 1.30 L (47% pred) - trial of symbicort 160 2bid 01/26/15      Depression 09/15/2018   Assessment: Increased sadness and depression since losing job  Plan: Patient denies suicidal and homicidal ideations Patient would not like to start medication therapies at this time Will establish patient with community health and wellness for primary care   Dyslipidemia    a. on statin   Essential hypertension    HLD (hyperlipidemia)    HTN (hypertension)    a. Reports intolerance to hydralazine; b. no beta blockers 2/2 bradycardia;  c. failed on ACE and ARB.   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 04/2014 Echo: EF 20-25%, sev glob HK.   Mixed Ischemic/Non-Ischemic Cardiomyopathy    probable mixed ischemic and non-ischemic    Noncompliance    OSA (obstructive sleep apnea) 04/18/2018   06/11/16 - home sleep study shows AHI of 2.9 an hour with the lowest SaO2 of 79% with an average of 93%  03/08/2018-Home sleep study-AHI 7/HR, SaO2 low 81%    Paroxysmal atrial fibrillation ([place])     a. identified on device interrogation 01/2016   Pneumonia 09/02/2013   Pulmonary edema 05/10/2014   Respiratory arrest ([place]) 05/18/2014   Sinus bradycardia 05/31/2014   Ventricular tachycardia ([place])    treated with ATP,  CL 250-300 msec    Past Surgical History:  Procedure Laterality Date   BI-VENTRICULAR IMPLANTABLE CARDIOVERTER DEFIBRILLATOR N/A 08/12/2014   MDT [name] CRTD implanted by Dr Rayann Heman   BIV ICD GENERATOR CHANGEOUT N/A 08/08/2021   Procedure: BIV ICD GENERATOR CHANGEOUT;  Surgeon: Thompson Grayer, MD;  Location: [facility];  Service: Cardiovascular;  Laterality: N/A;   CARDIAC CATHETERIZATION     ejection fraction 50%   LEFT HEART CATHETERIZATION WITH CORONARY ANGIOGRAM N/A 05/08/2014   Procedure: LEFT HEART CATHETERIZATION WITH CORONARY ANGIOGRAM;  Surgeon: Blane Ohara, MD;  Location: [facility] CATH LAB;  Service: Cardiovascular;  Laterality: N/A;    Family History  Problem Relation Age of Onset   Heart attack Neg Hx    Stroke Neg Hx     Social History Reviewed with no changes to be made today.   Outpatient Medications Prior to Visit  Medication Sig Dispense Refill   acetaminophen (TYLENOL) 650 MG CR tablet Take 650 mg by mouth every 8 (eight) hours as  needed for pain.     albuterol (VENTOLIN HFA) 108 (90 Base) MCG/ACT inhaler Inhale 1-2 puffs into the lungs every 6 (six) hours as needed for wheezing or shortness of breath. 18 g 1   atorvastatin (LIPITOR) 80 MG tablet Take 1 tablet (80 mg total) by mouth once daily. 90 tablet 3   benzonatate (TESSALON) 100 MG capsule Take 1 capsule (100 mg total) by mouth every 8 (eight) hours for cough. 21 capsule 0   clopidogrel (PLAVIX) 75 MG tablet TAKE 1 TABLET DAILY. 90 tablet 3   isosorbide mononitrate (IMDUR) 60 MG 24 hr tablet Take 1 tablet (60 mg total) by mouth daily. 90 tablet 2   metoprolol tartrate (LOPRESSOR) 100 MG tablet Take 1 tablet (100 mg total) by mouth 2 (two) times daily. 180 tablet 1    nitroGLYCERIN (NITROSTAT) 0.4 MG SL tablet Place 1 tablet (0.4 mg total) under the tongue every 5 (five) minutes as needed for chest pain. 25 tablet 3   potassium chloride SA (K-DUR,KLOR-CON) 20 MEQ tablet Take 40 mEq by mouth daily.     sacubitril-valsartan (ENTRESTO) 97-103 MG Take 1 tablet by mouth 2 (two) times daily. 180 tablet 3   amLODipine (NORVASC) 5 MG tablet Take 1 tablet (5 mg total) by mouth in the morning and at bedtime. 30 tablet 3   doxycycline (VIBRA-TABS) 100 MG tablet Take 1 tablet (100 mg total) by mouth 2 (two) times daily. 20 tablet 0   fluticasone furoate-vilanterol (BREO ELLIPTA) 200-25 MCG/ACT AEPB Inhale 1 puff into the lungs daily. 60 each 2   predniSONE (DELTASONE) 20 MG tablet Take 2 tablets (40 mg total) by mouth daily. 10 tablet 0   No facility-administered medications prior to visit.    Allergies  Allergen Reactions   Acyclovir And Related     GI upset    Aspirin Other (See Comments)    GI upset at high doses only.       Objective:    BP (!) 148/76   Pulse 90   Temp 98.1 F (36.7 C) (Temporal)   Ht 5' 8.5" (1.74 m)   Wt 149 lb 3.2 oz (67.7 kg)   SpO2 98%   BMI 22.36 kg/m  Wt Readings from Last 3 Encounters:  07/11/22 149 lb 3.2 oz (67.7 kg)  02/27/22 148 lb (67.1 kg)  02/02/22 148 lb (67.1 kg)    Physical Exam Vitals and nursing note reviewed.  Constitutional:      Appearance: [name] is well-developed.  HENT:     Head: Normocephalic and atraumatic.  Cardiovascular:     Rate and Rhythm: Normal rate and regular rhythm.     Heart sounds: Normal heart sounds. No murmur heard.    No friction rub. No gallop.  Pulmonary:     Effort: Pulmonary effort is normal. No tachypnea or respiratory distress.     Breath sounds: Wheezing and rhonchi present. No decreased breath sounds or rales.  Chest:     Chest wall: No tenderness.  Abdominal:     General: Bowel sounds are normal.     Palpations: Abdomen is soft.  Musculoskeletal:        General:  Normal range of motion.     Cervical back: Normal range of motion.  Skin:    General: Skin is warm and dry.  Neurological:     Mental Status: [name] is alert and oriented to person, place, and time.     Coordination: Coordination normal.  Psychiatric:  Behavior: Behavior normal. Behavior is cooperative.        Thought Content: Thought content normal.        Judgment: Judgment normal.          Patient has been counseled extensively about nutrition and exercise as well as the importance of adherence with medications and regular follow-up. The patient was given clear instructions to go to ER or return to medical center if symptoms don't improve, worsen or new problems develop. The patient verbalized understanding.   Follow-up: Return in about 3 months (around 10/11/2022).   Gildardo Pounds, FNP-BC [facility] and [place], [place]   07/11/2022, 4:09 PM

## 2022-07-12 ENCOUNTER — Other Ambulatory Visit: Payer: Self-pay

## 2022-07-12 ENCOUNTER — Other Ambulatory Visit: Payer: Self-pay | Admitting: Nurse Practitioner

## 2022-07-12 MED ORDER — BEVESPI AEROSPHERE 9-4.8 MCG/ACT IN AERO
2.0000 | INHALATION_SPRAY | Freq: Two times a day (BID) | RESPIRATORY_TRACT | 11 refills | Status: DC
  Filled 2022-07-12: qty 10.7, fill #0

## 2022-07-13 ENCOUNTER — Other Ambulatory Visit: Payer: Self-pay

## 2022-07-17 ENCOUNTER — Other Ambulatory Visit: Payer: Self-pay

## 2022-07-27 ENCOUNTER — Other Ambulatory Visit: Payer: Self-pay

## 2022-07-30 ENCOUNTER — Other Ambulatory Visit: Payer: Self-pay

## 2022-08-03 ENCOUNTER — Other Ambulatory Visit: Payer: Self-pay

## 2022-08-10 ENCOUNTER — Other Ambulatory Visit: Payer: Self-pay

## 2022-08-10 ENCOUNTER — Ambulatory Visit (INDEPENDENT_AMBULATORY_CARE_PROVIDER_SITE_OTHER): Payer: Self-pay

## 2022-08-10 DIAGNOSIS — I255 Ischemic cardiomyopathy: Secondary | ICD-10-CM

## 2022-08-11 LAB — CUP PACEART REMOTE DEVICE CHECK
Battery Remaining Longevity: 50 mo
Battery Voltage: 2.97 V
Brady Statistic AP VP Percent: 41.67 %
Brady Statistic AP VS Percent: 0.14 %
Brady Statistic AS VP Percent: 56.86 %
Brady Statistic AS VS Percent: 1.33 %
Brady Statistic RA Percent Paced: 40.36 %
Brady Statistic RV Percent Paced: 96.05 %
Date Time Interrogation Session: 20231201001602
HighPow Impedance: 69 Ohm
Implantable Lead Connection Status: 753985
Implantable Lead Connection Status: 753985
Implantable Lead Connection Status: 753985
Implantable Lead Implant Date: 20151203
Implantable Lead Implant Date: 20151203
Implantable Lead Implant Date: 20151203
Implantable Lead Location: 753858
Implantable Lead Location: 753859
Implantable Lead Location: 753860
Implantable Lead Model: 4598
Implantable Lead Model: 5076
Implantable Lead Model: 6935
Implantable Pulse Generator Implant Date: 20221129
Lead Channel Impedance Value: 152 Ohm
Lead Channel Impedance Value: 160.941
Lead Channel Impedance Value: 165.029
Lead Channel Impedance Value: 165.029
Lead Channel Impedance Value: 175.622
Lead Channel Impedance Value: 285 Ohm
Lead Channel Impedance Value: 304 Ohm
Lead Channel Impedance Value: 304 Ohm
Lead Channel Impedance Value: 304 Ohm
Lead Channel Impedance Value: 342 Ohm
Lead Channel Impedance Value: 361 Ohm
Lead Channel Impedance Value: 361 Ohm
Lead Channel Impedance Value: 399 Ohm
Lead Channel Impedance Value: 532 Ohm
Lead Channel Impedance Value: 551 Ohm
Lead Channel Impedance Value: 551 Ohm
Lead Channel Impedance Value: 589 Ohm
Lead Channel Impedance Value: 589 Ohm
Lead Channel Pacing Threshold Amplitude: 0.5 V
Lead Channel Pacing Threshold Amplitude: 1.25 V
Lead Channel Pacing Threshold Amplitude: 1.75 V
Lead Channel Pacing Threshold Pulse Width: 0.4 ms
Lead Channel Pacing Threshold Pulse Width: 0.4 ms
Lead Channel Pacing Threshold Pulse Width: 0.4 ms
Lead Channel Sensing Intrinsic Amplitude: 0.75 mV
Lead Channel Sensing Intrinsic Amplitude: 0.75 mV
Lead Channel Sensing Intrinsic Amplitude: 22 mV
Lead Channel Sensing Intrinsic Amplitude: 22 mV
Lead Channel Setting Pacing Amplitude: 1.5 V
Lead Channel Setting Pacing Amplitude: 2.25 V
Lead Channel Setting Pacing Amplitude: 3.75 V
Lead Channel Setting Pacing Pulse Width: 0.4 ms
Lead Channel Setting Pacing Pulse Width: 0.4 ms
Lead Channel Setting Sensing Sensitivity: 0.3 mV
Zone Setting Status: 755011
Zone Setting Status: 755011

## 2022-08-13 ENCOUNTER — Telehealth: Payer: Self-pay

## 2022-08-13 NOTE — Telephone Encounter (Signed)
Following alert received from [employer] received for 2 fast VT arrhythmias that fell into the VF zone and were both successfullly converted with one burst of ATP, there were 79 NSVT arrhythmias detected, sent to triage.  Attempted to contact patient to discuss s/s and medication compliance. Patient is due to OV with Dr. Elberta Fortis. No answer, LMTCB.

## 2022-08-14 NOTE — Telephone Encounter (Signed)
Outreach  made to Pt.  Advised Pt he had received therapy from his device in November x 2.  He does not recall the events.  Advised no driving x 6 months.  Pt questions how he will work if he cannot drive.  1 yr appointment scheduled for this Thursday to establish with Dr. Elberta Fortis.  Pt will call back if he cannot get off work.

## 2022-08-16 ENCOUNTER — Other Ambulatory Visit: Payer: Self-pay

## 2022-08-16 ENCOUNTER — Encounter: Payer: Self-pay | Admitting: Cardiology

## 2022-08-16 ENCOUNTER — Ambulatory Visit: Payer: Self-pay | Attending: Cardiology | Admitting: Cardiology

## 2022-08-16 VITALS — BP 126/60 | HR 80 | Ht 68.5 in | Wt 149.0 lb

## 2022-08-16 DIAGNOSIS — I5022 Chronic systolic (congestive) heart failure: Secondary | ICD-10-CM

## 2022-08-16 DIAGNOSIS — I472 Ventricular tachycardia, unspecified: Secondary | ICD-10-CM

## 2022-08-16 NOTE — Progress Notes (Signed)
Electrophysiology Office Note   Date:  08/16/2022   ID:  Vincent, Brewer 1941-07-14, MRN [id number]  PCP:  Claiborne Rigg, NP  Cardiologist:  [place] Primary Electrophysiologist:  Rett Stehlik Jorja Loa, MD    Chief Complaint: ICD   History of Present Illness: [name] is a 81 y.o. male who is being seen today for the evaluation of ICD at the request of Claiborne Rigg, NP. Presenting today for electrophysiology evaluation.  He has a history significant for coronary artery disease, chronic systolic heart failure due to ischemic cardiomyopathy, COPD, former tobacco abuse, hypertension, hyperlipidemia, SCAF.  He presents to clinic today after having episodes of VT treated with ATP.  Per the patient, he was unaware of these episodes.  He did have a respiratory illness 3 weeks ago, but had felt well over the last few days.  He has not had any chest pain or shortness of breath.  He has been able to do all of his daily activities.  Today, he denies symptoms of palpitations, chest pain, shortness of breath, orthopnea, PND, lower extremity edema, claudication, dizziness, presyncope, syncope, bleeding, or neurologic sequela. The patient is tolerating medications without difficulties.    Past Medical History:  Diagnosis Date   Acute renal failure (HCC) 05/31/2014   Angina decubitus 06/15/2014   CAD (coronary artery disease)    a. s/p stent RCA DES 9/09; b. 2014 Attempted PCI of OM1 @ [place];  c. 04/2014 Cath: LAD 40-50p, D1 95-99 (chronic), LCX 30-40 inf branch, OM1 CTO, RCA 30-40p, RCA patent stent, EF 35%->Med Rx.   Chronic combined systolic and diastolic CHF (congestive heart failure) (HCC)    a. EF about 40 to 45% per echo in April 2013;  b. 04/2014 Echo: EF 20-25%, sev LVH, sev glob HK, Gr 1 DD, mildly reduced RV fxn, PASP . c. 01/2017: EF improved to 50-55%.    Chronic low back pain    Complete heart block (HCC)    COPD (chronic obstructive pulmonary disease) (HCC) dx  06/2013   PFTs 07/08/13: mod obst with resp to bronchodilator, moderate decrease diffusion, airtrapping   COPD with acute exacerbation (HCC) 01/17/2017   COPD with asthma    07/08/13 PFT: FEV1 1.74L (66% pred, 30% change with BD), mod obst with resp to bronchodilator, moderate decrease diffusion, air-trapping 11/2013 Simple spiro>> clear obstruction, FEV1 1.30 L (47% pred) - trial of symbicort 160 2bid 01/26/15      Depression 09/15/2018   Assessment: Increased sadness and depression since losing job  Plan: Patient denies suicidal and homicidal ideations Patient would not like to start medication therapies at this time Jakita Dutkiewicz establish patient with community health and wellness for primary care   Dyslipidemia    a. on statin   Essential hypertension    HLD (hyperlipidemia)    HTN (hypertension)    a. Reports intolerance to hydralazine; b. no beta blockers 2/2 bradycardia;  c. failed on ACE and ARB.   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 04/2014 Echo: EF 20-25%, sev glob HK.   Mixed Ischemic/Non-Ischemic Cardiomyopathy    probable mixed ischemic and non-ischemic    Noncompliance    OSA (obstructive sleep apnea) 04/18/2018   06/11/16 - home sleep study shows AHI of 2.9 an hour with the lowest SaO2 of 79% with an average of 93%  03/08/2018-Home sleep study-AHI 7/HR, SaO2 low 81%    Paroxysmal atrial fibrillation (HCC)    a. identified on device interrogation 01/2016   Pneumonia 09/02/2013   Pulmonary edema 05/10/2014   Respiratory arrest (HCC) 05/18/2014   Sinus bradycardia 05/31/2014   Ventricular tachycardia (HCC)    treated with ATP,  CL 250-300 msec   Past Surgical History:  Procedure Laterality Date   BI-VENTRICULAR IMPLANTABLE CARDIOVERTER DEFIBRILLATOR N/A 08/12/2014   MDT Ovidio Kin XT CRTD implanted by Dr Johney Frame   BIV ICD  GENERATOR CHANGEOUT N/A 08/08/2021   Procedure: BIV ICD GENERATOR CHANGEOUT;  Surgeon: Hillis Range, MD;  Location: [facility] INVASIVE CV LAB;  Service: Cardiovascular;  Laterality: N/A;   CARDIAC CATHETERIZATION     ejection fraction 50%   LEFT HEART CATHETERIZATION WITH CORONARY ANGIOGRAM N/A 05/08/2014   Procedure: LEFT HEART CATHETERIZATION WITH CORONARY ANGIOGRAM;  Surgeon: Micheline Chapman, MD;  Location: [facility] CATH LAB;  Service: Cardiovascular;  Laterality: N/A;     Current Outpatient Medications  Medication Sig Dispense Refill   acetaminophen (TYLENOL) 650 MG CR tablet Take 650 mg by mouth every 8 (eight) hours as needed for pain.     albuterol (PROVENTIL) (2.5 MG/3ML) 0.083% nebulizer solution Take 3 mLs (2.5 mg total) by nebulization every 6 (six) hours as needed for wheezing or shortness of breath. 180 mL 1   albuterol (VENTOLIN HFA) 108 (90 Base) MCG/ACT inhaler Inhale 1-2 puffs into the lungs every 6 (six) hours as needed for wheezing or shortness of breath. 18 g 1   amLODipine (NORVASC) 10 MG tablet Take 1 tablet (10 mg total) by mouth daily. 90 tablet 1   atorvastatin (LIPITOR) 80 MG tablet Take 1 tablet (80 mg total) by mouth once daily. 90 tablet 3   benzonatate (TESSALON) 100 MG capsule Take 1 capsule (100 mg total) by mouth every 8 (eight) hours for cough. 21 capsule 0   clopidogrel (PLAVIX) 75 MG tablet TAKE 1 TABLET DAILY. 90 tablet 3   dextromethorphan-guaiFENesin (TUSSIN DM) 10-100 MG/5ML liquid Take 10 mLs by mouth every 6 (six) hours as needed for cough. 240 mL 1   fluticasone (FLONASE) 50 MCG/ACT nasal spray Place 2 sprays into both nostrils daily. 16 g 0   fluticasone-salmeterol (ADVAIR HFA) 230-21 MCG/ACT inhaler Inhale 2 puffs into the lungs 2 (two) times daily. 12 g 6   Glycopyrrolate-Formoterol (BEVESPI AEROSPHERE) 9-4.8 MCG/ACT AERO Inhale 2 puffs into the lungs 2 (two) times daily. NEEDS PASS 10.7 g 11   isosorbide mononitrate (IMDUR) 60 MG 24 hr tablet Take 1 tablet (60  mg total) by mouth daily. 90 tablet 2   metoprolol tartrate (LOPRESSOR) 100 MG tablet Take 1 tablet (100 mg total) by mouth 2 (two) times daily. 180 tablet 1   mometasone-formoterol (DULERA) 200-5 MCG/ACT AERO Inhale 2 puffs into the lungs 2 (two) times daily. 13 g 6   nitroGLYCERIN (NITROSTAT) 0.4 MG SL tablet Place 1 tablet (0.4 mg total) under the tongue every 5 (five) minutes as needed for chest pain. 25 tablet 3   potassium chloride SA (K-DUR,KLOR-CON) 20 MEQ tablet Take 40 mEq by mouth daily.     predniSONE (DELTASONE) 20 MG tablet Take 2 tablets (40 mg total) by mouth daily. 10 tablet 0   sacubitril-valsartan (ENTRESTO) 97-103 MG Take 1 tablet by mouth 2 (two) times daily. 180 tablet 3   No current facility-administered medications for this visit.    Allergies:   Acyclovir and related and Aspirin   Social History:  The patient  reports that he quit smoking about 9 years ago. His smoking use included cigarettes. He has a 28.00 pack-year smoking history. He has never used smokeless tobacco. He reports that he does not drink alcohol and does not use drugs.   Family History:  The patient's family history is not on file.    ROS:  Please see the history of present illness.   Otherwise, review of systems is positive for none.   All other systems are reviewed and negative.    PHYSICAL EXAM: VS:  BP 126/60   Pulse 80   Ht 5' 8.5" (1.74 m)   Wt 149 lb (67.6 kg)   SpO2 98%   BMI 22.33 kg/m  , BMI Body mass index is 22.33 kg/m. GEN: Well nourished, well developed, in no acute distress  HEENT: normal  Neck: no JVD, carotid bruits, or masses Cardiac: RRR; no murmurs, rubs, or gallops,no edema  Respiratory:  clear to auscultation bilaterally, normal work of breathing GI: soft, nontender, nondistended, + BS MS: no deformity or atrophy  Skin: warm and dry, device pocket is well healed Neuro:  Strength and sensation are intact Psych: euthymic mood, full affect  EKG:  EKG is ordered  today. Personal review of the ekg ordered shows A sense, V pace  Device interrogation is reviewed today in detail.  See PaceArt for details.   Recent Labs: 11/13/2021: Hemoglobin 12.3; Platelets 171 02/16/2022: ALT 8; BUN 8; Creatinine, Ser 0.89; Potassium 4.4; Sodium 142    Lipid Panel     Component Value Date/Time   CHOL 219 (H) 02/16/2022 0853   TRIG 110 02/16/2022 0853   HDL 65 02/16/2022 0853   CHOLHDL 3.4 02/16/2022 0853   CHOLHDL 2.9 11/29/2015 0908   VLDL 17 11/29/2015 0908   LDLCALC 135 (H) 02/16/2022 0853   LDLDIRECT 132.9 06/18/2013 1038     Wt Readings from Last 3 Encounters:  08/16/22 149 lb (67.6 kg)  07/11/22 149 lb 3.2 oz (67.7 kg)  02/27/22 148 lb (67.1 kg)      Other studies Reviewed: Additional studies/ records that were reviewed today include: TTE 01/15/22  Review of the above records today demonstrates:   1. Left ventricular ejection fraction, by estimation, is 35%. The left  ventricle has moderately decreased function. The left ventricle  demonstrates regional wall motion abnormalities with basal to mid  inferolateral and anterolateral severe hypokinesis  to akinesis. There is severe left ventricular hypertrophy. The LVH is  impressive, ?forme fruste of hypertrophic cardiomyopathy. Left ventricular  diastolic parameters are consistent with Grade I diastolic dysfunction  (impaired relaxation).   2. Right ventricular systolic function is mildly reduced. The right  ventricular size is normal. Tricuspid regurgitation signal is inadequate  for assessing PA pressure.   3. Left atrial size was mildly dilated.   4. The mitral valve is normal in structure. Trivial mitral valve  regurgitation. No evidence of mitral stenosis.   5. The aortic valve is tricuspid. There is mild calcification of the  aortic valve. Aortic valve regurgitation is not visualized. No aortic  stenosis is present.   6. The inferior vena cava is normal in size with greater than 50%   respiratory variability, suggesting right atrial pressure of 3 mmHg.    ASSESSMENT AND PLAN:  1.  Chronic systolic heart failure: Due to ischemic cardiomyopathy.  Status post [employer] ICD.  Device functioning appropriately.  No changes at this time.  2.  Coronary artery disease: No current chest pain.  Continue Plavix, beta-blocker, statin, nitrates.  Plan per primary cardiology.  3.  Hypertension: Currently well-controlled  4.  Subclinical atrial fibrillation: Minimal episodes.  Not anticoagulated.  CHA2DS2-VASc of 5.  5.  Ventricular tachycardia: Patient has had multiple episodes of VT.  Jdyn Parkerson check an basic metabolic and magnesium.  If these are normal, would likely continue to monitor.  If he has further episodes of VT, Zevin Nevares likely need antiarrhythmics.  He was advised no driving for the next 6 months.  Current medicines are reviewed at length with the patient today.   The patient does not have concerns regarding his medicines.  The following changes were made today:  none  Labs/ tests ordered today include:  Orders Placed This Encounter  Procedures   Basic metabolic panel   Magnesium   EKG 12-Lead     Disposition:   FU with Wade Asebedo 6 months  Signed, Sherrica Niehaus Jorja Loa, MD  08/16/2022 1:50 PM     [facility] [address] [place] [address] [phone] (office) [phone] (fax)

## 2022-08-16 NOTE — Patient Instructions (Signed)
Medication Instructions:  Your physician recommends that you continue on your current medications as directed. Please refer to the Current Medication list given to you today.  *If you need a refill on your cardiac medications before your next appointment, please call your pharmacy*   Lab Work: Today: BMET & Magnesium  If you have labs (blood work) drawn today and your tests are completely normal, you will receive your results only by: MyChart Message (if you have MyChart) OR A paper copy in the mail If you have any lab test that is abnormal or we need to change your treatment, we will call you to review the results.   Testing/Procedures: None ordered   Follow-Up: At [facility], you and your health needs are our priority.  As part of our continuing mission to provide you with exceptional heart care, we have created designated Provider Care Teams.  These Care Teams include your primary Cardiologist (physician) and Advanced Practice Providers (APPs -  Physician Assistants and Nurse Practitioners) who all work together to provide you with the care you need, when you need it.  Remote monitoring is used to monitor your Pacemaker or ICD from home. This monitoring reduces the number of office visits required to check your device to one time per year. It allows [place] to keep an eye on the functioning of your device to ensure it is working properly. You are scheduled for a device check from home on 11/09/2022. You may send your transmission at any time that day. If you have a wireless device, the transmission will be sent automatically. After your physician reviews your transmission, you will receive a postcard with your next transmission date.  Your next appointment:   3 month(s)  The format for your next appointment:   In Person  Provider:   Francis Dowse, PA-C    Thank you for choosing [facility]!!   Dory Horn, RN [phone]    Other Instructions  Important Information  About Sugar

## 2022-08-17 LAB — BASIC METABOLIC PANEL
BUN/Creatinine Ratio: 13 (ref 10–24)
BUN: 14 mg/dL (ref 8–27)
CO2: 22 mmol/L (ref 20–29)
Calcium: 9 mg/dL (ref 8.6–10.2)
Chloride: 103 mmol/L (ref 96–106)
Creatinine, Ser: 1.07 mg/dL (ref 0.76–1.27)
Glucose: 81 mg/dL (ref 70–99)
Potassium: 3.8 mmol/L (ref 3.5–5.2)
Sodium: 141 mmol/L (ref 134–144)
eGFR: 71 mL/min/{1.73_m2} (ref >59)

## 2022-08-17 LAB — MAGNESIUM: Magnesium: 2 mg/dL (ref 1.6–2.3)

## 2022-08-28 NOTE — Progress Notes (Signed)
Remote ICD transmission.   

## 2022-09-14 ENCOUNTER — Ambulatory Visit (HOSPITAL_COMMUNITY)
Admission: EM | Admit: 2022-09-14 | Discharge: 2022-09-14 | Disposition: A | Discharge status: Home / Self Care | Payer: Self-pay | Attending: Family Medicine | Admitting: Family Medicine

## 2022-09-14 ENCOUNTER — Other Ambulatory Visit: Payer: Self-pay

## 2022-09-14 ENCOUNTER — Encounter (HOSPITAL_COMMUNITY): Payer: Self-pay | Admitting: Emergency Medicine

## 2022-09-14 ENCOUNTER — Ambulatory Visit (INDEPENDENT_AMBULATORY_CARE_PROVIDER_SITE_OTHER): Payer: Self-pay

## 2022-09-14 DIAGNOSIS — Z950 Presence of cardiac pacemaker: Secondary | ICD-10-CM

## 2022-09-14 DIAGNOSIS — R0602 Shortness of breath: Secondary | ICD-10-CM

## 2022-09-14 DIAGNOSIS — R079 Chest pain, unspecified: Secondary | ICD-10-CM

## 2022-09-14 DIAGNOSIS — R059 Cough, unspecified: Secondary | ICD-10-CM

## 2022-09-14 DIAGNOSIS — J441 Chronic obstructive pulmonary disease with (acute) exacerbation: Secondary | ICD-10-CM

## 2022-09-14 MED ORDER — PREDNISONE 20 MG PO TABS
40.0000 mg | ORAL_TABLET | Freq: Every day | ORAL | 0 refills | Status: AC
  Filled 2022-09-14: qty 10, 5d supply, fill #0

## 2022-09-14 MED ORDER — DOXYCYCLINE HYCLATE 100 MG PO TABS
100.0000 mg | ORAL_TABLET | Freq: Two times a day (BID) | ORAL | 0 refills | Status: AC
  Filled 2022-09-14: qty 14, 7d supply, fill #0

## 2022-09-14 MED ORDER — IPRATROPIUM-ALBUTEROL 0.5-2.5 (3) MG/3ML IN SOLN
3.0000 mL | Freq: Once | RESPIRATORY_TRACT | Status: AC
  Administered 2022-09-14: 3 mL via RESPIRATORY_TRACT

## 2022-09-14 MED ORDER — IPRATROPIUM-ALBUTEROL 0.5-2.5 (3) MG/3ML IN SOLN
3.0000 mL | Freq: Four times a day (QID) | RESPIRATORY_TRACT | 0 refills | Status: DC | PRN
  Filled 2022-09-14: qty 90, 7d supply, fill #0
  Filled 2022-09-14: qty 270, 23d supply, fill #0

## 2022-09-14 MED ORDER — IPRATROPIUM-ALBUTEROL 0.5-2.5 (3) MG/3ML IN SOLN
RESPIRATORY_TRACT | Status: AC
  Filled 2022-09-14: qty 3

## 2022-09-14 NOTE — ED Triage Notes (Addendum)
Reports a dry cough since Wednesday, was using his inhaler, alkaseltzer, and nyquil to help with mild improvement. States that since then he's felt increasingly SOB with some centralized chest pain. Reports waking up multiple times during the night coughing and trying to catch his breath. Reports pain also near CVA/lower ribcage area bilaterally. Says he's unsure, but he believes his ICD may have shocked him last night.  Hx of ICD, COPD, pneumonia

## 2022-09-14 NOTE — ED Provider Notes (Signed)
[place]    CSN: [id number] Arrival date & time: 09/14/22  1234      History   Chief Complaint Chief Complaint  Patient presents with   Shortness of Breath   Chest Pain    HPI [name] is a 82 y.o. male.    Shortness of Breath Associated symptoms: chest pain   Chest Pain Associated symptoms: shortness of breath    Here for cough and shortness of breath.  A dry cough began 2 days ago on January 3.  He has been using his inhalers and they help sometimes.  He has had shortness of breath coming and going and it woke him up several times last night.  He states that he gets the urge to cough and then it makes some work to breathe for 5 to 10 seconds after that.  He does have some pain in his mid back bilaterally.  He has a ventricular pacer and ICD.  Any states that his ICD may have shocked him last night.  Past Medical History:  Diagnosis Date   Acute renal failure (Post) 05/31/2014   Angina decubitus 06/15/2014   CAD (coronary artery disease)    a. s/p stent RCA DES 9/09; b. 2014 Attempted PCI of OM1 @ [place];  c. 04/2014 Cath: LAD 40-50p, D1 95-99 (chronic), LCX 30-40 inf branch, OM1 CTO, RCA 30-40p, RCA patent stent, EF 35%->Med Rx.   Chronic combined systolic and diastolic CHF (congestive heart failure) (HCC)    a. EF about 40 to 45% per echo in April 2013;  b. 04/2014 Echo: EF 20-25%, sev LVH, sev glob HK, Gr 1 DD, mildly reduced RV fxn, PASP 69mmHg. c. 01/2017: EF improved to 50-55%.    Chronic low back pain    Complete heart block (HCC)    COPD (chronic obstructive pulmonary disease) ([place]) dx 06/2013   PFTs 07/08/13: mod obst with resp to bronchodilator, moderate decrease diffusion, airtrapping   COPD with acute exacerbation ([place]) 01/17/2017   COPD with asthma    07/08/13 PFT: FEV1 1.74L (66% pred, 30% change with BD), mod obst with resp to bronchodilator, moderate decrease diffusion, air-trapping 11/2013 Simple spiro>> clear obstruction, FEV1 1.30 L  (47% pred) - trial of symbicort 160 2bid 01/26/15      Depression 09/15/2018   Assessment: Increased sadness and depression since losing job  Plan: Patient denies suicidal and homicidal ideations Patient would not like to start medication therapies at this time Will establish patient with [facility] for primary care   Dyslipidemia    a. on statin   Essential hypertension    HLD (hyperlipidemia)    HTN (hypertension)    a. Reports intolerance to hydralazine; b. no beta blockers 2/2 bradycardia;  c. failed on ACE and ARB.   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 04/2014 Echo: EF 20-25%, sev glob HK.   Mixed Ischemic/Non-Ischemic Cardiomyopathy    probable mixed ischemic and non-ischemic    Noncompliance    OSA (obstructive sleep apnea) 04/18/2018   06/11/16 - home sleep study shows AHI of 2.9 an hour with the lowest SaO2 of 79% with an average of 93%  03/08/2018-Home sleep study-AHI 7/HR, SaO2 low 81%    Paroxysmal atrial fibrillation ([place])    a. identified on device interrogation 01/2016   Pneumonia 09/02/2013  Pulmonary edema 05/10/2014   Respiratory arrest (HCC) 05/18/2014   Sinus bradycardia 05/31/2014   Ventricular tachycardia (HCC)    treated with ATP,  CL 250-300 msec    Patient Active Problem List   Diagnosis Date Noted   Depression 09/15/2018   OSA (obstructive sleep apnea) 04/18/2018   Healthcare maintenance 04/18/2018   Essential hypertension 02/06/2017   Dizziness 02/06/2017   Coronary artery disease involving native coronary artery of native heart without angina pectoris 02/06/2017   Near syncope 02/06/2017   COPD with acute exacerbation (HCC) 01/17/2017   Allergic rhinitis 01/17/2017   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   Nocturnal hypoxemia 07/03/2016   Facial abscess 02/04/2016   Dental  abscess    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/12/2014   Angina decubitus 06/15/2014   Acute renal failure (HCC) 05/31/2014   Sinus bradycardia 05/31/2014   Syncope 05/30/2014   Chronic combined systolic and diastolic CHF (congestive heart failure) (HCC)    Mixed Ischemic/Non-Ischemic Cardiomyopathy    NSVT (nonsustained ventricular tachycardia) (HCC)    HLD (hyperlipidemia)    Respiratory arrest (HCC) 05/18/2014   CAD (coronary artery disease) 05/18/2014   Pulmonary edema 05/10/2014   HTN (hypertension) 05/08/2014   LBBB (left bundle branch block) 05/08/2014   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   Low back pain 10/05/2013   NSVT (nonsustained ventricular tachycardia) (HCC) 09/04/2013   Pneumonia 09/02/2013   Flu-like symptoms 09/02/2013   Anemia 09/02/2013   COPD with asthma    Fatigue 06/18/2013   Nocturia 06/18/2013   Dyspnea 06/18/2013   LV dysfunction 12/13/2011   CAD (coronary artery disease) 10/10/2011   Tobacco abuse 10/10/2011   Dyslipidemia    Noncompliance     Past Surgical History:  Procedure Laterality Date   BI-VENTRICULAR IMPLANTABLE CARDIOVERTER DEFIBRILLATOR N/A 08/12/2014   MDT Ovidio Kin XT CRTD implanted by Dr Johney Frame   BIV ICD GENERATOR CHANGEOUT N/A 08/08/2021   Procedure: BIV ICD GENERATOR CHANGEOUT;  Surgeon: Hillis Range, MD;  Location[facility] INVASIVE CV LAB;  Service: Cardiovascular;  Laterality: N/A;   CARDIAC CATHETERIZATION     ejection fraction 50%   LEFT HEART CATHETERIZATION WITH CORONARY ANGIOGRAM N/A 05/08/2014   Procedure: LEFT HEART CATHETERIZATION WITH CORONARY ANGIOGRAM;  Surgeon: Micheline Chapman, MD;  Location[facility] CATH LAB;  Service: Cardiovascular;  Laterality: N/A;       Home Medications    Prior to Admission medications   Medication Sig Start Date End Date Taking? Authorizing Provider  doxycycline (VIBRAMYCIN) 100 MG capsule Take 1 capsule (100 mg total) by mouth 2 (two) times daily for 7 days. 09/14/22 09/21/22 Yes Eutha Cude, Janace Aris, MD   ipratropium-albuterol (DUONEB) 0.5-2.5 (3) MG/3ML SOLN Take 3 mLs by nebulization every 6 (six) hours as needed (wheezing or shortness of breath). 09/14/22  Yes Zenia Resides, MD  predniSONE (DELTASONE) 20 MG tablet Take 2 tablets (40 mg total) by mouth daily with breakfast for 5 days. 09/14/22 09/19/22 Yes Zenia Resides, MD  acetaminophen (TYLENOL) 650 MG CR tablet Take 650 mg by mouth every 8 (eight) hours as needed for pain.    [provider]  albuterol (PROVENTIL) (2.5 MG/3ML) 0.083% nebulizer solution Take 3 mLs (2.5 mg total) by nebulization every 6 (six) hours as needed for wheezing or shortness of breath. 07/11/22   Claiborne Rigg, NP  albuterol (VENTOLIN HFA) 108 (90 Base) MCG/ACT inhaler Inhale 1-2 puffs into the lungs every 6 (six) hours as needed for wheezing or shortness of breath. 11/13/21   Claiborne Rigg,  NP  amLODipine (NORVASC) 10 MG tablet Take 1 tablet (10 mg total) by mouth daily. 07/11/22   Claiborne Rigg, NP  atorvastatin (LIPITOR) 80 MG tablet Take 1 tablet (80 mg total) by mouth once daily. 02/20/22   [place], Peter M, MD  benzonatate (TESSALON) 100 MG capsule Take 1 capsule (100 mg total) by mouth every 8 (eight) hours for cough. 05/30/22   Mardella Layman, MD  clopidogrel (PLAVIX) 75 MG tablet TAKE 1 TABLET DAILY. 11/08/21   Sheilah Pigeon, PA-C  dextromethorphan-guaiFENesin (TUSSIN DM) 10-100 MG/5ML liquid Take 10 mLs by mouth every 6 (six) hours as needed for cough. 07/11/22   Claiborne Rigg, NP  fluticasone (FLONASE) 50 MCG/ACT nasal spray Place 2 sprays into both nostrils daily. 07/11/22   Claiborne Rigg, NP  fluticasone-salmeterol (ADVAIR HFA) [phone] MCG/ACT inhaler Inhale 2 puffs into the lungs 2 (two) times daily. 07/11/22   Claiborne Rigg, NP  Glycopyrrolate-Formoterol (BEVESPI AEROSPHERE) 9-4.8 MCG/ACT AERO Inhale 2 puffs into the lungs 2 (two) times daily. NEEDS PASS 07/12/22   Claiborne Rigg, NP  isosorbide mononitrate (IMDUR) 60 MG 24 hr tablet  Take 1 tablet (60 mg total) by mouth daily. 01/11/20   [place], Peter M, MD  metoprolol tartrate (LOPRESSOR) 100 MG tablet Take 1 tablet (100 mg total) by mouth 2 (two) times daily. 02/07/21   Sheilah Pigeon, PA-C  mometasone-formoterol (DULERA) 200-5 MCG/ACT AERO Inhale 2 puffs into the lungs 2 (two) times daily. 07/11/22   Claiborne Rigg, NP  nitroGLYCERIN (NITROSTAT) 0.4 MG SL tablet Place 1 tablet (0.4 mg total) under the tongue every 5 (five) minutes as needed for chest pain. 12/08/21 04/27/24  Hoy Register, MD  potassium chloride SA (K-DUR,KLOR-CON) 20 MEQ tablet Take 40 mEq by mouth daily.    [provider]  sacubitril-valsartan (ENTRESTO) 97-103 MG Take 1 tablet by mouth 2 (two) times daily. 03/22/22   [place], Peter M, MD    Family History Family History  Problem Relation Age of Onset   Heart attack Neg Hx    Stroke Neg Hx     Social History Social History   Tobacco Use   Smoking status: Former    Packs/day: 0.50    Years: 56.00    Total pack years: 28.00    Types: Cigarettes    Quit date: 09/10/2012    Years since quitting: 10.0   Smokeless tobacco: Never   Tobacco comments:    tried 1 month ago (05/2018) but didn't like it   Vaping Use   Vaping Use: Never used  Substance Use Topics   Alcohol use: No   Drug use: No     Allergies   Acyclovir and related and Aspirin   Review of Systems Review of Systems  Respiratory:  Positive for shortness of breath.   Cardiovascular:  Positive for chest pain.     Physical Exam Triage Vital Signs ED Triage Vitals  Enc Vitals Group     BP 09/14/22 1253 (!) 151/104     Pulse Rate 09/14/22 1253 95     Resp 09/14/22 1253 18     Temp 09/14/22 1253 98.8 F (37.1 C)     Temp Source 09/14/22 1253 Oral     SpO2 09/14/22 1253 99 %     Weight --      Height --      Head Circumference --      Peak Flow --      Pain Score 09/14/22  1254 0     Pain Loc --      Pain Edu? --      Excl. in [place]? --    No data  found.  Updated Vital Signs BP (!) 151/104 (BP Location: Right Arm)   Pulse 95   Temp 98.8 F (37.1 C) (Oral)   Resp 18   SpO2 99%   Visual Acuity Right Eye Distance:   Left Eye Distance:   Bilateral Distance:    Right Eye Near:   Left Eye Near:    Bilateral Near:     Physical Exam Vitals reviewed.  Constitutional:      General: He is not in acute distress.    Appearance: He is not toxic-appearing.  HENT:     Right Ear: Tympanic membrane and ear canal normal.     Left Ear: Tympanic membrane and ear canal normal.     Nose: Congestion present.     Mouth/Throat:     Mouth: Mucous membranes are moist.     Pharynx: No oropharyngeal exudate or posterior oropharyngeal erythema.  Eyes:     Extraocular Movements: Extraocular movements intact.     Conjunctiva/sclera: Conjunctivae normal.     Pupils: Pupils are equal, round, and reactive to light.  Cardiovascular:     Rate and Rhythm: Normal rate and regular rhythm.     Heart sounds: No murmur heard. Pulmonary:     Breath sounds: No stridor. No wheezing, rhonchi or rales.     Comments: He does have some staccato cough some while I am in the room examining him.  No wheeze or rhonchi heard initial exam  He was reexamined when he was placed in exam room.  There was some expiratory wheeze with cough in his right lower lobe when examined then.  Also his O2 sat on room air was between 91 and 93% Musculoskeletal:     Cervical back: Neck supple.  Lymphadenopathy:     Cervical: No cervical adenopathy.  Skin:    Capillary Refill: Capillary refill takes less than 2 seconds.     Coloration: Skin is not jaundiced or pale.  Neurological:     General: No focal deficit present.     Mental Status: He is alert and oriented to person, place, and time.  Psychiatric:        Behavior: Behavior normal.      UC Treatments / Results  Labs (all labs ordered are listed, but only abnormal results are displayed) Labs Reviewed - No data to  display  EKG   Radiology DG Chest 2 View  Result Date: 09/14/2022 CLINICAL DATA:  Cough and shortness of breath. EXAM: CHEST - 2 VIEW COMPARISON:  Chest x-ray 05/30/2022 FINDINGS: Calcified and tortuous aorta. Normal cardiopericardial silhouette. Left chest battery pack with defibrillator leads. Similar configuration to prior. Hyperinflation. No consolidation, pneumothorax or effusion. Slight left atelectasis. Mild degenerative changes of the spine. Osteopenia. IMPRESSION: Hyperinflation. Chronic lung changes. Slight left basilar atelectasis. Defibrillator. Electronically Signed   By: Jill Side M.D.   On: 09/14/2022 14:44    Procedures Procedures (including critical care time)  Medications Ordered in UC Medications  ipratropium-albuterol (DUONEB) 0.5-2.5 (3) MG/3ML nebulizer solution 3 mL (3 mLs Nebulization Given 09/14/22 1446)    Initial Impression / Assessment and Plan / UC Course  I have reviewed the triage vital signs and the nursing notes.  Pertinent labs & imaging results that were available during my care of the patient were reviewed by me and considered  in my medical decision making (see chart for details).       EKG shows a ventricular paced rhythm with PVCs in between Chest x-ray shows hyperinflation and some atelectasis in the left lower lobe.  He does have some improvement in his symptoms with a DuoNeb treatment here in the clinic.  I am going to treat for COPD exacerbation, and in case that atelectasis is an early pneumonia, doxycycline is sent in  COVID swab is done, and if positive he is a candidate for Paxlovid.  Last EGFR was 71 in December 2023  Final Clinical Impressions(s) / UC Diagnoses   Final diagnoses:  COPD exacerbation [facility])     Discharge Instructions      Your chest x-ray did show signs of bronchospasm.  Also there may have been an early pneumonia developing.  Your EKG showed your pacemaker is working.  Use ipratropium-albuterol in the  nebulizer every 6 hours as needed for shortness of breath or wheezing.  Take prednisone 20 mg--2 daily for 5 days  Take doxycycline 100 mg --1 capsule 2 times daily for 7 days      ED Prescriptions     Medication Sig Dispense Auth. Provider   ipratropium-albuterol (DUONEB) 0.5-2.5 (3) MG/3ML SOLN Take 3 mLs by nebulization every 6 (six) hours as needed (wheezing or shortness of breath). 360 mL Zenia Resides, MD   predniSONE (DELTASONE) 20 MG tablet Take 2 tablets (40 mg total) by mouth daily with breakfast for 5 days. 10 tablet Zenia Resides, MD   doxycycline (VIBRAMYCIN) 100 MG capsule Take 1 capsule (100 mg total) by mouth 2 (two) times daily for 7 days. 14 capsule Marlinda Mike, Janace Aris, MD      PDMP not reviewed this encounter.   Zenia Resides, MD 09/14/22 [phone]

## 2022-09-14 NOTE — Discharge Instructions (Addendum)
Your chest x-ray did show signs of bronchospasm.  Also there may have been an early pneumonia developing.  Your EKG showed your pacemaker is working.  Use ipratropium-albuterol in the nebulizer every 6 hours as needed for shortness of breath or wheezing.  Take prednisone 20 mg--2 daily for 5 days  Take doxycycline 100 mg --1 capsule 2 times daily for 7 days   You have been swabbed for COVID, and the test will result in the next 24 hours. Our staff will call you if positive. If the COVID test is positive, you should quarantine for 5 days from the start of your symptoms

## 2022-10-15 ENCOUNTER — Other Ambulatory Visit: Payer: Self-pay

## 2022-10-15 ENCOUNTER — Encounter: Payer: Self-pay | Admitting: Nurse Practitioner

## 2022-10-15 ENCOUNTER — Ambulatory Visit: Payer: Self-pay | Attending: Nurse Practitioner | Admitting: Nurse Practitioner

## 2022-10-15 VITALS — BP 160/65 | HR 65 | Ht 68.5 in | Wt 145.8 lb

## 2022-10-15 DIAGNOSIS — F17200 Nicotine dependence, unspecified, uncomplicated: Secondary | ICD-10-CM

## 2022-10-15 DIAGNOSIS — Z23 Encounter for immunization: Secondary | ICD-10-CM

## 2022-10-15 DIAGNOSIS — I10 Essential (primary) hypertension: Secondary | ICD-10-CM

## 2022-10-15 MED ORDER — SACUBITRIL-VALSARTAN 97-103 MG PO TABS
1.0000 | ORAL_TABLET | Freq: Two times a day (BID) | ORAL | 0 refills | Status: DC
  Filled 2022-10-15: qty 180, 90d supply, fill #0

## 2022-10-15 MED ORDER — ISOSORBIDE MONONITRATE ER 60 MG PO TB24
60.0000 mg | ORAL_TABLET | Freq: Every day | ORAL | 0 refills | Status: DC
  Filled 2022-10-15: qty 90, 90d supply, fill #0

## 2022-10-15 MED ORDER — AMLODIPINE BESYLATE 10 MG PO TABS
10.0000 mg | ORAL_TABLET | Freq: Every day | ORAL | 1 refills | Status: DC
  Filled 2022-10-15: qty 90, 90d supply, fill #0

## 2022-10-15 NOTE — Progress Notes (Unsigned)
Assessment & Plan:  [name] was seen today for hypertension.  Diagnoses and all orders for this visit:  Need for influenza vaccination -     Flu Vaccine QUAD 61mo+IM (Fluarix, Fluzone & Alfiuria Quad PF)  Essential hypertension -     amLODipine (NORVASC) 10 MG tablet; Take 1 tablet (10 mg total) by mouth daily.  Tobacco dependence -     CT CHEST LUNG CA SCREEN LOW DOSE W/O CM; Future  Other orders -     isosorbide mononitrate (IMDUR) 60 MG 24 hr tablet; Take 1 tablet (60 mg total) by mouth daily. -     sacubitril-valsartan (ENTRESTO) 97-103 MG; Take 1 tablet by mouth 2 (two) times daily.    Patient has been counseled on age-appropriate routine health concerns for screening and prevention. These are reviewed and up-to-date. Referrals have been placed accordingly. Immunizations are up-to-date or declined.    Subjective:   Chief Complaint  Patient presents with   Hypertension   HPI [name] 82 y.o. male presents to office today    Taking amlodipine 10 mg, entresto 97-103 mg BID,   BP Readings from Last 3 Encounters:  10/15/22 (!) 163/72  09/14/22 (!) 151/104  08/16/22 126/60     ROS  Past Medical History:  Diagnosis Date   Acute renal failure ([place]) 05/31/2014   Angina decubitus 06/15/2014   CAD (coronary artery disease)    a. s/p stent RCA DES 9/09; b. 2014 Attempted PCI of OM1 @ [facility];  c. 04/2014 Cath: LAD 40-50p, D1 95-99 (chronic), LCX 30-40 inf branch, OM1 CTO, RCA 30-40p, RCA patent stent, EF 35%->Med Rx.   Chronic combined systolic and diastolic CHF (congestive heart failure) (HCC)    a. EF about 40 to 45% per echo in April 2013;  b. 04/2014 Echo: EF 20-25%, sev LVH, sev glob HK, Gr 1 DD, mildly reduced RV fxn, PASP 81mmHg. c. 01/2017: EF improved to 50-55%.    Chronic low back pain    Complete heart block (HCC)    COPD (chronic obstructive pulmonary disease) ([place]) dx 06/2013   PFTs 07/08/13: mod obst with resp to bronchodilator, moderate decrease  diffusion, airtrapping   COPD with acute exacerbation ([place]) 01/17/2017   COPD with asthma    07/08/13 PFT: FEV1 1.74L (66% pred, 30% change with BD), mod obst with resp to bronchodilator, moderate decrease diffusion, air-trapping 11/2013 Simple spiro>> clear obstruction, FEV1 1.30 L (47% pred) - trial of symbicort 160 2bid 01/26/15      Depression 09/15/2018   Assessment: Increased sadness and depression since losing job  Plan: Patient denies suicidal and homicidal ideations Patient would not like to start medication therapies at this time Will establish patient with community health and wellness for primary care   Dyslipidemia    a. on statin   Essential hypertension    HLD (hyperlipidemia)    HTN (hypertension)    a. Reports intolerance to hydralazine; b. no beta blockers 2/2 bradycardia;  c. failed on ACE and ARB.   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 04/2014 Echo: EF 20-25%, sev glob HK.   Mixed Ischemic/Non-Ischemic Cardiomyopathy    probable mixed ischemic and non-ischemic    Noncompliance    OSA (obstructive sleep apnea) 04/18/2018   06/11/16 - home sleep study shows AHI of 2.9 an hour with the  lowest SaO2 of 79% with an average of 93%  03/08/2018-Home sleep study-AHI 7/HR, SaO2 low 81%    Paroxysmal atrial fibrillation ([place])    a. identified on device interrogation 01/2016   Pneumonia 09/02/2013   Pulmonary edema 05/10/2014   Respiratory arrest ([place]) 05/18/2014   Sinus bradycardia 05/31/2014   Ventricular tachycardia ([place])    treated with ATP,  CL 250-300 msec    Past Surgical History:  Procedure Laterality Date   BI-VENTRICULAR IMPLANTABLE CARDIOVERTER DEFIBRILLATOR N/A 08/12/2014   MDT [name] XT CRTD implanted by Dr [name]   BIV ICD GENERATOR CHANGEOUT N/A 08/08/2021   Procedure: BIV ICD GENERATOR CHANGEOUT;  Surgeon:  Vincent Grayer, MD;  Location: [facility];  Service: Cardiovascular;  Laterality: N/A;   CARDIAC CATHETERIZATION     ejection fraction 50%   LEFT HEART CATHETERIZATION WITH CORONARY ANGIOGRAM N/A 05/08/2014   Procedure: LEFT HEART CATHETERIZATION WITH CORONARY ANGIOGRAM;  Surgeon: Vincent Ohara, MD;  Location: [facility] CATH [facility];  Service: Cardiovascular;  Laterality: N/A;    Family History  Problem Relation Age of Onset   Heart attack Neg Hx    Stroke Neg Hx     Social History Reviewed with no changes to be made today.   Outpatient Medications Prior to Visit  Medication Sig Dispense Refill   acetaminophen (TYLENOL) 650 MG CR tablet Take 650 mg by mouth every 8 (eight) hours as needed for pain.     albuterol (PROVENTIL) (2.5 MG/3ML) 0.083% nebulizer solution Take 3 mLs (2.5 mg total) by nebulization every 6 (six) hours as needed for wheezing or shortness of breath. 180 mL 1   albuterol (VENTOLIN HFA) 108 (90 Base) MCG/ACT inhaler Inhale 1-2 puffs into the lungs every 6 (six) hours as needed for wheezing or shortness of breath. 18 g 1   atorvastatin (LIPITOR) 80 MG tablet Take 1 tablet (80 mg total) by mouth once daily. 90 tablet 3   benzonatate (TESSALON) 100 MG capsule Take 1 capsule (100 mg total) by mouth every 8 (eight) hours for cough. 21 capsule 0   clopidogrel (PLAVIX) 75 MG tablet TAKE 1 TABLET DAILY. 90 tablet 3   dextromethorphan-guaiFENesin (TUSSIN DM) 10-100 MG/5ML liquid Take 10 mLs by mouth every 6 (six) hours as needed for cough. 240 mL 1   fluticasone (FLONASE) 50 MCG/ACT nasal spray Place 2 sprays into both nostrils daily. 16 g 0   fluticasone-salmeterol (ADVAIR HFA) 230-21 MCG/ACT inhaler Inhale 2 puffs into the lungs 2 (two) times daily. 12 g 6   Glycopyrrolate-Formoterol (BEVESPI AEROSPHERE) 9-4.8 MCG/ACT AERO Inhale 2 puffs into the lungs 2 (two) times daily. NEEDS PASS 10.7 g 11   ipratropium-albuterol (DUONEB) 0.5-2.5 (3) MG/3ML SOLN Take 3 mLs by nebulization  every 6 (six) hours as needed (wheezing or shortness of breath). 360 mL 0   metoprolol tartrate (LOPRESSOR) 100 MG tablet Take 1 tablet (100 mg total) by mouth 2 (two) times daily. 180 tablet 1   mometasone-formoterol (DULERA) 200-5 MCG/ACT AERO Inhale 2 puffs into the lungs 2 (two) times daily. 13 g 6   nitroGLYCERIN (NITROSTAT) 0.4 MG SL tablet Place 1 tablet (0.4 mg total) under the tongue every 5 (five) minutes as needed for chest pain. 25 tablet 3   potassium chloride SA (K-DUR,KLOR-CON) 20 MEQ tablet Take 40 mEq by mouth daily.     amLODipine (NORVASC) 10 MG tablet Take 1 tablet (10 mg total) by mouth daily. 90 tablet 1   isosorbide mononitrate (IMDUR) 60 MG 24  hr tablet Take 1 tablet (60 mg total) by mouth daily. 90 tablet 2   sacubitril-valsartan (ENTRESTO) 97-103 MG Take 1 tablet by mouth 2 (two) times daily. 180 tablet 3   No facility-administered medications prior to visit.    Allergies  Allergen Reactions   Acyclovir And Related     GI upset    Aspirin Other (See Comments)    GI upset at high doses only.       Objective:    BP (!) 163/72   Pulse 65   Ht 5' 8.5" (1.74 m)   Wt 145 lb 12.8 oz (66.1 kg)   SpO2 98%   BMI 21.85 kg/m  Wt Readings from Last 3 Encounters:  10/15/22 145 lb 12.8 oz (66.1 kg)  08/16/22 149 lb (67.6 kg)  07/11/22 149 lb 3.2 oz (67.7 kg)    Physical Exam       Patient has been counseled extensively about nutrition and exercise as well as the importance of adherence with medications and regular follow-up. The patient was given clear instructions to go to ER or return to medical center if symptoms don't improve, worsen or new problems develop. The patient verbalized understanding.   Follow-up: No follow-ups on file.   Gildardo Pounds, FNP-BC [facility] and [place], [place]   10/15/2022, 4:23 PM

## 2022-10-16 ENCOUNTER — Encounter: Payer: Self-pay | Admitting: Nurse Practitioner

## 2022-10-16 NOTE — Progress Notes (Signed)
Scheduled

## 2022-10-26 ENCOUNTER — Ambulatory Visit (HOSPITAL_COMMUNITY): Payer: Self-pay

## 2022-11-05 ENCOUNTER — Other Ambulatory Visit: Payer: Self-pay

## 2022-11-05 ENCOUNTER — Encounter: Payer: Self-pay | Admitting: Nurse Practitioner

## 2022-11-05 ENCOUNTER — Ambulatory Visit: Payer: Self-pay | Attending: Nurse Practitioner | Admitting: Nurse Practitioner

## 2022-11-05 VITALS — BP 152/65 | HR 68 | Ht 68.0 in | Wt 148.0 lb

## 2022-11-05 DIAGNOSIS — I10 Essential (primary) hypertension: Secondary | ICD-10-CM

## 2022-11-05 DIAGNOSIS — K219 Gastro-esophageal reflux disease without esophagitis: Secondary | ICD-10-CM

## 2022-11-05 MED ORDER — ISOSORBIDE MONONITRATE ER 30 MG PO TB24
15.0000 mg | ORAL_TABLET | Freq: Every day | ORAL | 1 refills | Status: DC
  Filled 2022-11-05: qty 15, 30d supply, fill #0

## 2022-11-05 MED ORDER — FAMOTIDINE 40 MG PO TABS
40.0000 mg | ORAL_TABLET | Freq: Every day | ORAL | 1 refills | Status: DC
  Filled 2022-11-05: qty 90, 90d supply, fill #0

## 2022-11-05 NOTE — Progress Notes (Signed)
Assessment & Plan:  [name] was seen today for hypertension.  Diagnoses and all orders for this visit:  Essential hypertension -     isosorbide mononitrate (IMDUR) 30 MG 24 hr tablet; Take 0.5 tablets (15 mg total) by mouth daily. Continue imdur 60 mg and all other blood pressure medications as prescribed. Bring in all your medications to all of your office visits.  Monitor your blood pressure at home   GERD without esophagitis -     famotidine (PEPCID) 40 MG tablet; Take 1 tablet (40 mg total) by mouth daily. INSTRUCTIONS: Avoid GERD Triggers: acidic, spicy or fried foods, caffeine, coffee, sodas,  alcohol and chocolate.      Patient has been counseled on age-appropriate routine health concerns for screening and prevention. These are reviewed and up-to-date. Referrals have been placed accordingly. Immunizations are up-to-date or declined.    Subjective:   Chief Complaint  Patient presents with   Hypertension   HPI [name] 82 y.o. male presents to office today for follow up to HTN  He has a history of CAD, chronic combined systolic and diastolic CHF, HTN, HLD, PAF, FORMER Tobacco abuse with COPD, ICD/BiV implant (EF 50%) secondary to complete heart block, OSA, left ankle nondisplaced fracture of the left fibula    He was asked by me a few weeks ago to bring in all of his medications as he was uncertain of what doses he was taking at that time. His blood pressure was also elevated. He was unsure if he was taking the increased dose of Imdur from 30 to 60 mg or if he was taking the increased dose of Entresto 49-51 mg twice daily or 97-103 mg twice daily.  Those changes had been made at his last cardiology office visit on February 27, 2022.  He did confirm that day that he had been taking amlodipine 10 mg daily as prescribed.   Unfortunately he did not bring any medications with him today. BLood pressure continues elevated. He also endorses intermittent chest pain which seems to have  worsened after he ate pig feet last week. States he feels food sometimes gets stuck in the middle of his chest and he has to take alka seltzer and drink more water for this. He can not find his BP monitor.  BP Readings from Last 3 Encounters:  11/05/22 (!) 152/65  10/16/22 (!) 160/65  09/14/22 (!) 151/104         Review of Systems  Constitutional:  Negative for fever, malaise/fatigue and weight loss.  HENT: Negative.  Negative for nosebleeds.   Eyes: Negative.  Negative for blurred vision, double vision and photophobia.  Respiratory: Negative.  Negative for cough, hemoptysis, sputum production, shortness of breath and wheezing.   Cardiovascular:  Positive for chest pain. Negative for palpitations and leg swelling.  Gastrointestinal:  Positive for heartburn. Negative for abdominal pain, blood in stool, constipation, diarrhea, melena, nausea and vomiting.  Musculoskeletal: Negative.  Negative for myalgias.  Neurological: Negative.  Negative for dizziness, focal weakness, seizures and headaches.  Psychiatric/Behavioral: Negative.  Negative for suicidal ideas.     Past Medical History:  Diagnosis Date   Acute renal failure ([place]) 05/31/2014   Angina decubitus 06/15/2014   CAD (coronary artery disease)    a. s/p stent RCA DES 9/09; b. 2014 Attempted PCI of OM1 @ [place];  c. 04/2014 Cath: LAD 40-50p, D1 95-99 (chronic), LCX 30-40 inf branch, OM1 CTO, RCA 30-40p, RCA patent stent, EF 35%->Med Rx.  Chronic combined systolic and diastolic CHF (congestive heart failure) (HCC)    a. EF about 40 to 45% per echo in April 2013;  b. 04/2014 Echo: EF 20-25%, sev LVH, sev glob HK, Gr 1 DD, mildly reduced RV fxn, PASP 38mHg. c. 01/2017: EF improved to 50-55%.    Chronic low back pain    Complete heart block (HCC)    COPD (chronic obstructive pulmonary disease) ([place] dx 06/2013   PFTs 07/08/13: mod obst with resp to bronchodilator, moderate decrease diffusion, airtrapping   [place] 01/17/2017   COPD with asthma    07/08/13 PFT: FEV1 1.74L (66% pred, 30% change with BD), mod obst with resp to bronchodilator, moderate decrease diffusion, air-trapping 11/2013 Simple spiro>> clear obstruction, FEV1 1.30 L (47% pred) - trial of symbicort 160 2bid 01/26/15      Depression 09/15/2018   Assessment: Increased sadness and depression since losing job  Plan: Patient denies suicidal and homicidal ideations Patient would not like to start medication therapies at this time Will establish patient with community health and wellness for primary care   Dyslipidemia    a. on statin   Essential hypertension    HLD (hyperlipidemia)    HTN (hypertension)    a. Reports intolerance to hydralazine; b. no beta blockers 2/2 bradycardia;  c. failed on ACE and ARB.   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 04/2014 Echo: EF 20-25%, sev glob HK.   Mixed Ischemic/Non-Ischemic Cardiomyopathy    probable mixed ischemic and non-ischemic    Noncompliance    OSA (obstructive sleep apnea) 04/18/2018   06/11/16 - home sleep study shows AHI of 2.9 an hour with the lowest SaO2 of 79% with an average of 93%  03/08/2018-Home sleep study-AHI 7/HR, SaO2 low 81%    [place]    a. identified on device interrogation 01/2016   Pneumonia 09/02/2013   Pulmonary edema 05/10/2014   Respiratory arrest ([place] 05/18/2014   Sinus bradycardia 05/31/2014   Ventricular tachycardia ([place]    treated with ATP,  CL 250-300 msec    Past Surgical History:  Procedure Laterality Date   BI-VENTRICULAR IMPLANTABLE CARDIOVERTER DEFIBRILLATOR N/A 08/12/2014   MDT VHillery AldoXT CRTD implanted by Dr ARayann Heman  BIV ICD GENERATOR CHANGEOUT N/A 08/08/2021   Procedure: BIV ICD GENERATOR CHANGEOUT;  Surgeon: AThompson Grayer MD;  [facility];  Service: Cardiovascular;  Laterality: N/A;   CARDIAC CATHETERIZATION     ejection fraction 50%   LEFT HEART CATHETERIZATION WITH CORONARY ANGIOGRAM N/A 05/08/2014   Procedure: LEFT HEART CATHETERIZATION WITH CORONARY ANGIOGRAM;  Surgeon: MBlane Ohara MD;  [facility];  Service: Cardiovascular;  Laterality: N/A;    Family History  Problem Relation Age of Onset   Heart attack Neg Hx    Stroke Neg Hx     Social History Reviewed with no changes to be made today.   Outpatient Medications Prior to Visit  Medication Sig Dispense Refill   acetaminophen (TYLENOL) 650 MG CR tablet Take 650 mg by mouth every 8 (eight) hours as needed for pain.     albuterol (PROVENTIL) (2.5 MG/3ML) 0.083% nebulizer solution Take 3 mLs (2.5 mg total) by nebulization every 6 (six) hours as needed for wheezing or shortness of breath. 180 mL 1   albuterol (  VENTOLIN HFA) 108 (90 Base) MCG/ACT inhaler Inhale 1-2 puffs into the lungs every 6 (six) hours as needed for wheezing or shortness of breath. 18 g 1   amLODipine (NORVASC) 10 MG tablet Take 1 tablet (10 mg total) by mouth daily. 90 tablet 1   atorvastatin (LIPITOR) 80 MG tablet Take 1 tablet (80 mg total) by mouth once daily. 90 tablet 3   clopidogrel (PLAVIX) 75 MG tablet TAKE 1 TABLET DAILY. 90 tablet 3   dextromethorphan-guaiFENesin (TUSSIN DM) 10-100 MG/5ML liquid Take 10 mLs by mouth every 6 (six) hours as needed for cough. 240 mL 1   fluticasone (FLONASE) 50 MCG/ACT nasal spray Place 2 sprays into both nostrils daily. 16 g 0   fluticasone-salmeterol (ADVAIR HFA) 230-21 MCG/ACT inhaler Inhale 2 puffs into the lungs 2 (two) times daily. 12 g 6   Glycopyrrolate-Formoterol (BEVESPI AEROSPHERE) 9-4.8 MCG/ACT AERO Inhale 2 puffs into the lungs 2 (two) times daily. NEEDS PASS 10.7 g 11   ipratropium-albuterol (DUONEB) 0.5-2.5 (3) MG/3ML SOLN Take 3 mLs by nebulization every 6 (six) hours as needed (wheezing or shortness of breath). 360 mL 0    isosorbide mononitrate (IMDUR) 60 MG 24 hr tablet Take 1 tablet (60 mg total) by mouth daily. 90 tablet 0   metoprolol tartrate (LOPRESSOR) 100 MG tablet Take 1 tablet (100 mg total) by mouth 2 (two) times daily. 180 tablet 1   mometasone-formoterol (DULERA) 200-5 MCG/ACT AERO Inhale 2 puffs into the lungs 2 (two) times daily. 13 g 6   nitroGLYCERIN (NITROSTAT) 0.4 MG SL tablet Place 1 tablet (0.4 mg total) under the tongue every 5 (five) minutes as needed for chest pain. 25 tablet 3   potassium chloride SA (K-DUR,KLOR-CON) 20 MEQ tablet Take 40 mEq by mouth daily.     sacubitril-valsartan (ENTRESTO) 97-103 MG Take 1 tablet by mouth 2 (two) times daily. 180 tablet 0   benzonatate (TESSALON) 100 MG capsule Take 1 capsule (100 mg total) by mouth every 8 (eight) hours for cough. 21 capsule 0   No facility-administered medications prior to visit.    Allergies  Allergen Reactions   Acyclovir And Related     GI upset    Aspirin Other (See Comments)    GI upset at high doses only.       Objective:    BP (!) 152/65   Pulse 68   Ht '5\' 8"'$  (1.727 m)   Wt 148 lb (67.1 kg)   SpO2 98%   BMI 22.50 kg/m  Wt Readings from Last 3 Encounters:  11/05/22 148 lb (67.1 kg)  10/15/22 145 lb 12.8 oz (66.1 kg)  08/16/22 149 lb (67.6 kg)    Physical Exam Vitals and nursing note reviewed.  Constitutional:      Appearance: He is well-developed.  HENT:     Head: Normocephalic and atraumatic.  Cardiovascular:     Rate and Rhythm: Normal rate and regular rhythm.     Heart sounds: Normal heart sounds. No murmur heard.    No friction rub. No gallop.  Pulmonary:     Effort: Pulmonary effort is normal. No tachypnea or respiratory distress.     Breath sounds: Normal breath sounds. No decreased breath sounds, wheezing, rhonchi or rales.  Chest:     Chest wall: No tenderness.  Abdominal:     General: Bowel sounds are normal.     Palpations: Abdomen is soft.  Musculoskeletal:        General: Normal  range of motion.  Cervical back: Normal range of motion.  Skin:    General: Skin is warm and dry.  Neurological:     Mental Status: He is alert and oriented to person, place, and time.     Coordination: Coordination normal.  Psychiatric:        Behavior: Behavior normal. Behavior is cooperative.        Thought Content: Thought content normal.        Judgment: Judgment normal.          Patient has been counseled extensively about nutrition and exercise as well as the importance of adherence with medications and regular follow-up. The patient was given clear instructions to go to ER or return to medical center if symptoms don't improve, worsen or new problems develop. The patient verbalized understanding.   Follow-up: Return in about 3 months (around 02/03/2023).   Gildardo Pounds, FNP-BC [facility] and [employer], [place]   11/05/2022, 7:47 PM

## 2022-11-05 NOTE — Progress Notes (Signed)
High BP readings at home. In the 130s over 100s.  Onset chest pain last Wednesday after eating fried pig feet. Acid reflux.

## 2022-11-09 ENCOUNTER — Ambulatory Visit: Payer: Self-pay

## 2022-11-09 ENCOUNTER — Telehealth: Payer: Self-pay

## 2022-11-09 DIAGNOSIS — I255 Ischemic cardiomyopathy: Secondary | ICD-10-CM

## 2022-11-09 LAB — CUP PACEART REMOTE DEVICE CHECK
Battery Remaining Longevity: 44 mo
Battery Voltage: 2.97 V
Brady Statistic AP VP Percent: 45.78 %
Brady Statistic AP VS Percent: 0.14 %
Brady Statistic AS VP Percent: 52.13 %
Brady Statistic AS VS Percent: 1.95 %
Brady Statistic RA Percent Paced: 43.57 %
Brady Statistic RV Percent Paced: 94.99 %
Date Time Interrogation Session: 20240301033323
HighPow Impedance: 59 Ohm
Implantable Lead Connection Status: 753985
Implantable Lead Connection Status: 753985
Implantable Lead Connection Status: 753985
Implantable Lead Implant Date: 20151203
Implantable Lead Implant Date: 20151203
Implantable Lead Implant Date: 20151203
Implantable Lead Location: 753858
Implantable Lead Location: 753859
Implantable Lead Location: 753860
Implantable Lead Model: 4598
Implantable Lead Model: 5076
Implantable Lead Model: 6935
Implantable Pulse Generator Implant Date: 20221129
Lead Channel Impedance Value: 142.5 Ohm
Lead Channel Impedance Value: 142.5 Ohm
Lead Channel Impedance Value: 147.097
Lead Channel Impedance Value: 147.097
Lead Channel Impedance Value: 147.097
Lead Channel Impedance Value: 247 Ohm
Lead Channel Impedance Value: 285 Ohm
Lead Channel Impedance Value: 285 Ohm
Lead Channel Impedance Value: 285 Ohm
Lead Channel Impedance Value: 285 Ohm
Lead Channel Impedance Value: 304 Ohm
Lead Channel Impedance Value: 304 Ohm
Lead Channel Impedance Value: 399 Ohm
Lead Channel Impedance Value: 475 Ohm
Lead Channel Impedance Value: 475 Ohm
Lead Channel Impedance Value: 475 Ohm
Lead Channel Impedance Value: 513 Ohm
Lead Channel Impedance Value: 513 Ohm
Lead Channel Pacing Threshold Amplitude: 0.625 V
Lead Channel Pacing Threshold Amplitude: 1 V
Lead Channel Pacing Threshold Amplitude: 1.875 V
Lead Channel Pacing Threshold Pulse Width: 0.4 ms
Lead Channel Pacing Threshold Pulse Width: 0.4 ms
Lead Channel Pacing Threshold Pulse Width: 0.6 ms
Lead Channel Sensing Intrinsic Amplitude: 1.625 mV
Lead Channel Sensing Intrinsic Amplitude: 1.625 mV
Lead Channel Sensing Intrinsic Amplitude: 22.25 mV
Lead Channel Sensing Intrinsic Amplitude: 22.25 mV
Lead Channel Setting Pacing Amplitude: 1.5 V
Lead Channel Setting Pacing Amplitude: 2 V
Lead Channel Setting Pacing Amplitude: 3.75 V
Lead Channel Setting Pacing Pulse Width: 0.4 ms
Lead Channel Setting Pacing Pulse Width: 0.6 ms
Lead Channel Setting Sensing Sensitivity: 0.3 mV
Zone Setting Status: 755011
Zone Setting Status: 755011

## 2022-11-09 NOTE — Telephone Encounter (Signed)
Alert received from [employer]:  Scheduled remote reviewed. Normal device function.   4 VF episodes all converted with ATP x1 - route to triage 125 NSVT, 6 high rate NSVT

## 2022-11-09 NOTE — Telephone Encounter (Signed)
Left detailed message requesting call back.  Pt was recently seen 08/16/2022 with VT treated with ATP.  No action advised at that time unless Pt had more.  Will forward to Dr. Curt Bears for review and advisement.    Pt has follow up scheduled with RU 11/19/2022.

## 2022-11-13 NOTE — Telephone Encounter (Signed)
Joseph Art sees patient next week in clinic on 11/19/22.  Will flag for her to start patient on amiodarone per Dr. Curt Bears orders.

## 2022-11-19 ENCOUNTER — Ambulatory Visit: Payer: Self-pay | Admitting: Physician Assistant

## 2022-11-22 ENCOUNTER — Other Ambulatory Visit: Payer: Self-pay

## 2022-11-22 MED ORDER — AMIODARONE HCL 200 MG PO TABS
ORAL_TABLET | ORAL | 1 refills | Status: DC
  Filled 2022-11-22: qty 90, 34d supply, fill #0

## 2022-11-22 NOTE — Addendum Note (Signed)
Addended by: Stanton Kidney on: 11/22/2022 08:28 AM   Modules accepted: Orders

## 2022-11-22 NOTE — Telephone Encounter (Signed)
[name] is aware.  She will discuss with Dr. Curt Bears timing of starting amiodarone.

## 2022-11-22 NOTE — Telephone Encounter (Signed)
Pt advised to start Amiodarone per Dr. Curt Bears Advised pt to: START Amiodarone - take 2 tablets (400 mg total) TWICE a day for 2 weeks, then  - take 1 tablet (200 mg total) TWICE a day for 2 weeks, then  - take 1 tablet (200 mg total) ONCE a day  Advised to take with food during the first month of taking  this. Advised to call office if SE occur after starting. Advised to keep upcoming appt w/ Tommye Standard, PA on 3/26. Will send instructions to pt via mychart. Patient verbalized understanding and agreeable to plan.

## 2022-11-28 NOTE — Progress Notes (Signed)
Cardiology Office Note Date:  12/04/2022  Patient ID:  [name] 1941-08-29, MRN [id number] PCP:  Gildardo Pounds, NP  Cardiologist:  Peter Martinique, MD Electrophysiologist: Constance Haw, MD   Chief Complaint: VT by device, new start amiodarone  History of Present Illness: [name] is a 82 y.o. male with PMH notable for CAD, HFrEF, ICM, COPD, former tobacco use, HTN, HLD, SCAF; .   He saw Dr. Curt Bears 08/2022 after a VT episode treated with ATP. Patient was asymptomatic. Labs stable, no med changes, rec AAD if VT continues. NO driving. Device clinic alerted earlier this month for 4 VF episodes, all converted with ATP x 1, and >100 NSVT episodes. It was recommended at that time for patient to begin amiodarone via telephone notes.  He is seen today for Vincent Meredith Leeds, MD for acute visit due to increase VT requiring ATP therapy He has been taking 400mg  amiodarone BID, Vincent lower to 200mg  BID in 2 days, and then further decrease to 200mg  daily.  Since December 2023, he is having increased "cramping" in central chest, usually also has dizziness with it. Sometimes has sweating. He had an episode like this last night, took pepcid, then alka-seltzer that changed the cramping, then took tylenol that relieved the pain. This type of pain is not relieved with belching or passing gas. These episodes are random, tend to be at rest. He has not tried SL nitro for these types of episodes; he suspects GI source. Episodes can last as long as an hour.  Separately, he also has been having worsening DOE that is sometimes associated with pre-syncope, takes a PRN nitro and SOB and lightheadedness goes away. No CP or "cramping" with these episodes. 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 Noting, he routinely gets 10-13k steps/day at work. Some days with symptoms, others without symptoms.    Timeline is difficult to discern, new since December. Seem to be escalating in frequency.   Denies syncope  Decreased appetite, but denies bloating or increased edema. Having unintentional weight loss. He has run out of his entresto for a week now, and requests a refill. He says there was a mix-up at the pharmacy.  Diligently takes all other medications as prescribed.   Device Information: MDT CRT-D, imp 08/2014; dx VT, HFrEF Gen change 07/2021   Past Medical History:  Diagnosis Date   Acute renal failure ([place]) 05/31/2014   Angina decubitus 06/15/2014   CAD (coronary artery disease)    a. s/p stent RCA DES 9/09; b. 2014 Attempted PCI of OM1 @ [place];  c. 04/2014 Cath: LAD 40-50p, D1 95-99 (chronic), LCX 30-40 inf branch, OM1 CTO, RCA 30-40p, RCA patent stent, EF 35%->Med Rx.   Chronic combined systolic and diastolic CHF (congestive heart failure) (HCC)    a. EF about 40 to 45% per echo in April 2013;  b. 04/2014 Echo: EF 20-25%, sev LVH, sev glob HK, Gr 1 DD, mildly reduced RV fxn, PASP 22mmHg. c. 01/2017: EF improved to 50-55%.    Chronic low back pain    Complete heart block (HCC)    COPD (chronic obstructive pulmonary disease) ([place]) dx 06/2013   PFTs 07/08/13: mod obst with resp to bronchodilator, moderate decrease diffusion, airtrapping   COPD with acute exacerbation ([place]) 01/17/2017   COPD with asthma    07/08/13 PFT: FEV1 1.74L (66% pred, 30% change with BD), mod obst  with resp to bronchodilator, moderate decrease diffusion, air-trapping 11/2013 Simple spiro>> clear obstruction, FEV1 1.30 L (47% pred) - trial of symbicort 160 2bid 01/26/15      Depression 09/15/2018   Assessment: Increased sadness and depression since losing job  Plan: Patient denies suicidal and homicidal ideations Patient would not like to start medication therapies at this time Vincent establish [facility] with [facility] care   Dyslipidemia    a. on statin   Essential hypertension     HLD (hyperlipidemia)    HTN (hypertension)    a. Reports intolerance to hydralazine; b. no beta blockers 2/2 bradycardia;  c. failed on ACE and ARB.   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 04/2014 Echo: EF 20-25%, sev glob HK.   Mixed Ischemic/Non-Ischemic Cardiomyopathy    probable mixed ischemic and non-ischemic    Noncompliance    OSA (obstructive sleep apnea) 04/18/2018   06/11/16 - home sleep study shows AHI of 2.9 an hour with the lowest SaO2 of 79% with an average of 93%  03/08/2018-Home sleep study-AHI 7/HR, SaO2 low 81%    Paroxysmal atrial fibrillation ([place])    a. identified on device interrogation 01/2016   Pneumonia 09/02/2013   Pulmonary edema 05/10/2014   Respiratory arrest ([place]) 05/18/2014   Sinus bradycardia 05/31/2014   Ventricular tachycardia ([place])    treated with ATP,  CL 250-300 msec    Past Surgical History:  Procedure Laterality Date   BI-VENTRICULAR IMPLANTABLE CARDIOVERTER DEFIBRILLATOR N/A 08/12/2014   MDT [name] CRTD implanted by Dr Rayann Heman   BIV ICD GENERATOR CHANGEOUT N/A 08/08/2021   Procedure: BIV ICD GENERATOR CHANGEOUT;  Surgeon: Thompson Grayer, MD;  Location: [facility];  Service: Cardiovascular;  Laterality: N/A;   CARDIAC CATHETERIZATION     ejection fraction 50%   LEFT HEART CATHETERIZATION WITH CORONARY ANGIOGRAM N/A 05/08/2014   Procedure: LEFT HEART CATHETERIZATION WITH CORONARY ANGIOGRAM;  Surgeon: Blane Ohara, MD;  Location: [facility] C[facility];  Service: Cardiovascular;  Laterality: N/A;    Current Outpatient Medications  Medication Instructions   acetaminophen (TYLENOL) 650 mg, Oral, Every 8 hours PRN   albuterol (PROVENTIL) 2.5 mg, Nebulization, Every 6 hours PRN   albuterol (VENTOLIN HFA) 108 (90 Base) MCG/ACT inhaler 1-2 puffs, Inhalation, Every 6 hours PRN   amiodarone  (PACERONE) 200 MG tablet Take 2 tablets (400 mg total) TWICE a day for 2 weeks, then take 1 tablet (200 mg total) TWICE a day for 2 weeks, then take 1 tablet (200 mg total) ONCE a day thereafter   amLODipine (NORVASC) 10 mg, Oral, Daily   atorvastatin (LIPITOR) 80 MG tablet Take 1 tablet (80 mg total) by mouth once daily.   clopidogrel (PLAVIX) 75 MG tablet TAKE 1 TABLET DAILY.   dextromethorphan-guaiFENesin (TUSSIN DM) 10-100 MG/5ML liquid 10 mLs, Oral, Every 6 hours PRN   famotidine (PEPCID) 40 mg, Oral, Daily   fluticasone (FLONASE) 50 MCG/ACT nasal spray 2 sprays, Each Nare, Daily   fluticasone-salmeterol (ADVAIR HFA) 230-21 MCG/ACT inhaler 2 puffs, Inhalation, 2 times daily   Glycopyrrolate-Formoterol (BEVESPI AEROSPHERE) 9-4.8 MCG/ACT AERO 2 puffs, Inhalation, 2 times daily, NEEDS PASS   ipratropium-albuterol (DUONEB) 0.5-2.5 (3) MG/3ML SOLN 3 mLs, Nebulization, Every 6 hours PRN   isosorbide mononitrate (IMDUR) 60 mg, Oral, Daily   isosorbide mononitrate (IMDUR) 15 mg,  Oral, Daily   metoprolol tartrate (LOPRESSOR) 100 mg, Oral, 2 times daily   mometasone-formoterol (DULERA) 200-5 MCG/ACT AERO 2 puffs, Inhalation, 2 times daily   nitroGLYCERIN (NITROSTAT) 0.4 mg, Sublingual, Every 5 min PRN   potassium chloride SA (K-DUR,KLOR-CON) 20 MEQ tablet 40 mEq, Oral, Daily   sacubitril-valsartan (ENTRESTO) 97-103 MG 1 tablet, Oral, 2 times daily    Social History:  The patient  reports that he quit smoking about 10 years ago. His smoking use included cigarettes. He has a 28.00 pack-year smoking history. He has never used smokeless tobacco. He reports that he does not drink alcohol and does not use drugs.   ROS:  Please see the history of present illness. All other systems are reviewed and otherwise negative.   PHYSICAL EXAM:  VS:  BP (!) 140/80   Pulse 85   Ht 5\' 8"  (1.727 m)   Wt 146 lb 6.4 oz (66.4 kg)   SpO2 98%   BMI 22.26 kg/m  BMI: Body mass index is 22.26 kg/m.  GEN- The patient  is well appearing, alert and oriented x 3 today.   HEENT: normocephalic, atraumatic; sclera clear, conjunctiva pink; hearing intact; oropharynx clear; neck supple, no JVP Lungs- Clear to ausculation bilaterally, normal work of breathing.  No wheezes, rales, rhonchi Heart- Regular rate and rhythm, no murmurs, rubs or gallops, PMI not laterally displaced GI- soft, non-tender, non-distended, bowel sounds present, no hepatosplenomegaly Extremities- No peripheral edema. no clubbing or cyanosis; DP/PT/radial pulses 2+ bilaterally MS- no significant deformity or atrophy Skin- warm and dry, no rash or lesion, device pocket well-healed Psych- euthymic mood, full affect Neuro- strength and sensation are intact   Device interrogation done today and reviewed by myself:  Battery good Lead thresholds, impedence, sensing stable  No further VT episodes requiring therapy since starting amiodarone Brief NSVT episodes, longest 3 sec V-pacing 95% No changes made today  EKG is ordered. Personal review of EKG from today shows:  AV paced, 68bpm. No acute changes from prior  Recent Labs: 02/16/2022: ALT 8 08/16/2022: BUN 14; Creatinine, Ser 1.07; Magnesium 2.0; Potassium 3.8; Sodium 141  02/16/2022: Chol/HDL Ratio 3.4; Cholesterol, Total 219; HDL 65; LDL Chol Calc (NIH) 135; Triglycerides 110   CrCl cannot be calculated (Patient's most recent lab result is older than the maximum 21 days allowed.).   Wt Readings from Last 3 Encounters:  12/04/22 146 lb 6.4 oz (66.4 kg)  11/05/22 148 lb (67.1 kg)  10/15/22 145 lb 12.8 oz (66.1 kg)     Additional studies reviewed include: Previous EP, cardiology notes.   TTE, 01/2022  1. Left ventricular ejection fraction, by estimation, is 35%. The left ventricle has moderately decreased function. The left ventricle demonstrates regional wall motion abnormalities with basal to mid inferolateral and anterolateral severe hypokinesis to akinesis. There is severe left  ventricular hypertrophy. The LVH is impressive, ?forme fruste of hypertrophic cardiomyopathy. Left ventricular diastolic parameters are consistent with Grade I diastolic dysfunction (impaired relaxation).   2. Right ventricular systolic function is mildly reduced. The right ventricular size is normal. Tricuspid regurgitation signal is inadequate for assessing PA pressure.   3. Left atrial size was mildly dilated.   4. The mitral valve is normal in structure. Trivial mitral valve regurgitation. No evidence of mitral stenosis.   5. The aortic valve is tricuspid. There is mild calcification of the aortic valve. Aortic valve regurgitation is not visualized. No aortic stenosis is present.   6. The inferior vena cava is normal in  size with greater than 50% respiratory variability, suggesting right atrial pressure of 3 mmHg.   Myo perfusion Lexiscan, 12/15/2021   Findings are consistent with prior myocardial infarction. The study is intermediate risk.   No ST deviation was noted.   Left ventricular function is abnormal. Global function is severely reduced. End diastolic cavity size is normal.   Prior study available for comparison from 02/20/2016.   Small, moderate intensity fixed lesion in the inferolateral wall consistent with old scar. No significant change from prior study  ASSESSMENT AND PLAN:  #) NSVT #) CAD  ischemic cardiomyopathy #) VT #) Chest pain #) DOE Device functioning well, see paceart for details PO Amiodarone started for VT requiring ATP therapy - update amio labs today No further VT since starting amiodarone Continues to have brief NSVT episodes, asymptotic  Last stress test 12/2021 with fixed lesion consistent with old scar Patient reports increased DOE, dizziness relieved with nitro Also reports central chest "cramping" that could be anginal equivalent Both types of episodes seem to be worsening in severity Recommended LHC to eval  Discussed risks/benefits of procedure.  Patient verbalized understanding and wished to proceed. Vincent be scheduled with Dr. Martinique.   #) HFrEF Euvolemic on exam Entresto refill provided  NYHA class III symptoms   #) HTN Restarted entresto as above   Current medicines are reviewed at length with the patient today.   The patient does not have concerns regarding his medicines.  The following changes were made today:  none  Labs/ tests ordered today include:  Orders Placed This Encounter  Procedures   Comprehensive metabolic panel   TSH   T4, free   MYOCARDIAL PERFUSION IMAGING     Disposition: Follow up with Dr. Curt Bears in in 6 months   Signed, Mamie Levers, NP  12/04/22  5:20 PM  Electrophysiology [facility]

## 2022-11-28 NOTE — H&P (View-Only) (Signed)
 Cardiology Office Note Date:  12/04/2022  Patient ID:  [name], DOB 08/26/1941, MRN [id number] PCP:  Fleming, Zelda W, NP  Cardiologist:  Peter Jordan, MD Electrophysiologist: Will Martin Camnitz, MD   Chief Complaint: VT by device, new start amiodarone  History of Present Illness: [name] is a 82 y.o. male with PMH notable for CAD, HFrEF, ICM, COPD, former tobacco use, HTN, HLD, SCAF; .   He saw Dr. Camnitz 08/2022 after a VT episode treated with ATP. Patient was asymptomatic. Labs stable, no med changes, rec AAD if VT continues. NO driving. Device clinic alerted earlier this month for 4 VF episodes, all converted with ATP x 1, and >100 NSVT episodes. It was recommended at that time for patient to begin amiodarone via telephone notes.  He is seen today for Will Martin Camnitz, MD for acute visit due to increase VT requiring ATP therapy He has been taking 400mg amiodarone BID, will lower to 200mg BID in 2 days, and then further decrease to 200mg daily.  Since December 2023, he is having increased "cramping" in central chest, usually also has dizziness with it. Sometimes has sweating. He had an episode like this last night, took pepcid, then alka-seltzer that changed the cramping, then took tylenol that relieved the pain. This type of pain is not relieved with belching or passing gas. These episodes are random, tend to be at rest. He has not tried SL nitro for these types of episodes; he suspects GI source. Episodes can last as long as an hour.  Separately, he also has been having worsening DOE that is sometimes associated with pre-syncope, takes a PRN nitro and SOB and lightheadedness goes away. No CP or "cramping" with these episodes. 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 Noting, he routinely gets 10-13k steps/day at work. Some days with symptoms, others without symptoms.    Timeline is difficult to discern, new since December. Seem to be escalating in frequency.   Denies syncope  Decreased appetite, but denies bloating or increased edema. Having unintentional weight loss. He has run out of his entresto for a week now, and requests a refill. He says there was a mix-up at the pharmacy.  Diligently takes all other medications as prescribed.   Device Information: MDT CRT-D, imp 08/2014; dx VT, HFrEF Gen change 07/2021   Past Medical History:  Diagnosis Date   Acute renal failure (HCC) 05/31/2014   Angina decubitus 06/15/2014   CAD (coronary artery disease)    a. s/p stent RCA DES 9/09; b. 2014 Attempted PCI of OM1 @ [place];  c. 04/2014 Cath: LAD 40-50p, D1 95-99 (chronic), LCX 30-40 inf branch, OM1 CTO, RCA 30-40p, RCA patent stent, EF 35%->Med Rx.   Chronic combined systolic and diastolic CHF (congestive heart failure) (HCC)    a. EF about 40 to 45% per echo in April 2013;  b. 04/2014 Echo: EF 20-25%, sev LVH, sev glob HK, Gr 1 DD, mildly reduced RV fxn, PASP 38mmHg. c. 01/2017: EF improved to 50-55%.    Chronic low back pain    Complete heart block (HCC)    COPD (chronic obstructive pulmonary disease) (HCC) dx 06/2013   PFTs 07/08/13: mod obst with resp to bronchodilator, moderate decrease diffusion, airtrapping   COPD with acute exacerbation (HCC) 01/17/2017   COPD with asthma    07/08/13 PFT: FEV1 1.74L (66% pred, 30% change with BD), mod obst   with resp to bronchodilator, moderate decrease diffusion, air-trapping 11/2013 Simple spiro>> clear obstruction, FEV1 1.30 L (47% pred) - trial of symbicort 160 2bid 01/26/15      Depression 09/15/2018   Assessment: Increased sadness and depression since losing job  Plan: Patient denies suicidal and homicidal ideations Patient would not like to start medication therapies at this time Will establish patient with [facility] for primary care   Dyslipidemia    a. on statin   Essential hypertension     HLD (hyperlipidemia)    HTN (hypertension)    a. Reports intolerance to hydralazine; b. no beta blockers 2/2 bradycardia;  c. failed on ACE and ARB.   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 04/2014 Echo: EF 20-25%, sev glob HK.   Mixed Ischemic/Non-Ischemic Cardiomyopathy    probable mixed ischemic and non-ischemic    Noncompliance    OSA (obstructive sleep apnea) 04/18/2018   06/11/16 - home sleep study shows AHI of 2.9 an hour with the lowest SaO2 of 79% with an average of 93%  03/08/2018-Home sleep study-AHI 7/HR, SaO2 low 81%    Paroxysmal atrial fibrillation (HCC)    a. identified on device interrogation 01/2016   Pneumonia 09/02/2013   Pulmonary edema 05/10/2014   Respiratory arrest (HCC) 05/18/2014   Sinus bradycardia 05/31/2014   Ventricular tachycardia (HCC)    treated with ATP,  CL 250-300 msec    Past Surgical History:  Procedure Laterality Date   BI-VENTRICULAR IMPLANTABLE CARDIOVERTER DEFIBRILLATOR N/A 08/12/2014   MDT Viva Quad XT CRTD implanted by Dr Allred   BIV ICD GENERATOR CHANGEOUT N/A 08/08/2021   Procedure: BIV ICD GENERATOR CHANGEOUT;  Surgeon: Allred, James, MD;  Location: [facility];  Service: Cardiovascular;  Laterality: N/A;   CARDIAC CATHETERIZATION     ejection fraction 50%   LEFT HEART CATHETERIZATION WITH CORONARY ANGIOGRAM N/A 05/08/2014   Procedure: LEFT HEART CATHETERIZATION WITH CORONARY ANGIOGRAM;  Surgeon: Michael D Cooper, MD;  Location: [facility] CATH [facility];  Service: Cardiovascular;  Laterality: N/A;    Current Outpatient Medications  Medication Instructions   acetaminophen (TYLENOL) 650 mg, Oral, Every 8 hours PRN   albuterol (PROVENTIL) 2.5 mg, Nebulization, Every 6 hours PRN   albuterol (VENTOLIN HFA) 108 (90 Base) MCG/ACT inhaler 1-2 puffs, Inhalation, Every 6 hours PRN   amiodarone  (PACERONE) 200 MG tablet Take 2 tablets (400 mg total) TWICE a day for 2 weeks, then take 1 tablet (200 mg total) TWICE a day for 2 weeks, then take 1 tablet (200 mg total) ONCE a day thereafter   amLODipine (NORVASC) 10 mg, Oral, Daily   atorvastatin (LIPITOR) 80 MG tablet Take 1 tablet (80 mg total) by mouth once daily.   clopidogrel (PLAVIX) 75 MG tablet TAKE 1 TABLET DAILY.   dextromethorphan-guaiFENesin (TUSSIN DM) 10-100 MG/5ML liquid 10 mLs, Oral, Every 6 hours PRN   famotidine (PEPCID) 40 mg, Oral, Daily   fluticasone (FLONASE) 50 MCG/ACT nasal spray 2 sprays, Each Nare, Daily   fluticasone-salmeterol (ADVAIR HFA) 230-21 MCG/ACT inhaler 2 puffs, Inhalation, 2 times daily   Glycopyrrolate-Formoterol (BEVESPI AEROSPHERE) 9-4.8 MCG/ACT AERO 2 puffs, Inhalation, 2 times daily, NEEDS PASS   ipratropium-albuterol (DUONEB) 0.5-2.5 (3) MG/3ML SOLN 3 mLs, Nebulization, Every 6 hours PRN   isosorbide mononitrate (IMDUR) 60 mg, Oral, Daily   isosorbide mononitrate (IMDUR) 15 mg,   Oral, Daily   metoprolol tartrate (LOPRESSOR) 100 mg, Oral, 2 times daily   mometasone-formoterol (DULERA) 200-5 MCG/ACT AERO 2 puffs, Inhalation, 2 times daily   nitroGLYCERIN (NITROSTAT) 0.4 mg, Sublingual, Every 5 min PRN   potassium chloride SA (K-DUR,KLOR-CON) 20 MEQ tablet 40 mEq, Oral, Daily   sacubitril-valsartan (ENTRESTO) 97-103 MG 1 tablet, Oral, 2 times daily    Social History:  The patient  reports that he quit smoking about 10 years ago. His smoking use included cigarettes. He has a 28.00 pack-year smoking history. He has never used smokeless tobacco. He reports that he does not drink alcohol and does not use drugs.   ROS:  Please see the history of present illness. All other systems are reviewed and otherwise negative.   PHYSICAL EXAM:  VS:  BP (!) 140/80   Pulse 85   Ht 5' 8" (1.727 m)   Wt 146 lb 6.4 oz (66.4 kg)   SpO2 98%   BMI 22.26 kg/m  BMI: Body mass index is 22.26 kg/m.  GEN- The patient  is well appearing, alert and oriented x 3 today.   HEENT: normocephalic, atraumatic; sclera clear, conjunctiva pink; hearing intact; oropharynx clear; neck supple, no JVP Lungs- Clear to ausculation bilaterally, normal work of breathing.  No wheezes, rales, rhonchi Heart- Regular rate and rhythm, no murmurs, rubs or gallops, PMI not laterally displaced GI- soft, non-tender, non-distended, bowel sounds present, no hepatosplenomegaly Extremities- No peripheral edema. no clubbing or cyanosis; DP/PT/radial pulses 2+ bilaterally MS- no significant deformity or atrophy Skin- warm and dry, no rash or lesion, device pocket well-healed Psych- euthymic mood, full affect Neuro- strength and sensation are intact   Device interrogation done today and reviewed by myself:  Battery good Lead thresholds, impedence, sensing stable  No further VT episodes requiring therapy since starting amiodarone Brief NSVT episodes, longest 3 sec V-pacing 95% No changes made today  EKG is ordered. Personal review of EKG from today shows:  AV paced, 68bpm. No acute changes from prior  Recent Labs: 02/16/2022: ALT 8 08/16/2022: BUN 14; Creatinine, Ser 1.07; Magnesium 2.0; Potassium 3.8; Sodium 141  02/16/2022: Chol/HDL Ratio 3.4; Cholesterol, Total 219; HDL 65; LDL Chol Calc (NIH) 135; Triglycerides 110   CrCl cannot be calculated (Patient's most recent lab result is older than the maximum 21 days allowed.).   Wt Readings from Last 3 Encounters:  12/04/22 146 lb 6.4 oz (66.4 kg)  11/05/22 148 lb (67.1 kg)  10/15/22 145 lb 12.8 oz (66.1 kg)     Additional studies reviewed include: Previous EP, cardiology notes.   TTE, 01/2022  1. Left ventricular ejection fraction, by estimation, is 35%. The left ventricle has moderately decreased function. The left ventricle demonstrates regional wall motion abnormalities with basal to mid inferolateral and anterolateral severe hypokinesis to akinesis. There is severe left  ventricular hypertrophy. The LVH is impressive, ?forme fruste of hypertrophic cardiomyopathy. Left ventricular diastolic parameters are consistent with Grade I diastolic dysfunction (impaired relaxation).   2. Right ventricular systolic function is mildly reduced. The right ventricular size is normal. Tricuspid regurgitation signal is inadequate for assessing PA pressure.   3. Left atrial size was mildly dilated.   4. The mitral valve is normal in structure. Trivial mitral valve regurgitation. No evidence of mitral stenosis.   5. The aortic valve is tricuspid. There is mild calcification of the aortic valve. Aortic valve regurgitation is not visualized. No aortic stenosis is present.   6. The inferior vena cava is normal in   size with greater than 50% respiratory variability, suggesting right atrial pressure of 3 mmHg.   Myo perfusion Lexiscan, 12/15/2021   Findings are consistent with prior myocardial infarction. The study is intermediate risk.   No ST deviation was noted.   Left ventricular function is abnormal. Global function is severely reduced. End diastolic cavity size is normal.   Prior study available for comparison from 02/20/2016.   Small, moderate intensity fixed lesion in the inferolateral wall consistent with old scar. No significant change from prior study  ASSESSMENT AND PLAN:  #) NSVT #) CAD  ischemic cardiomyopathy #) VT #) Chest pain #) DOE Device functioning well, see paceart for details PO Amiodarone started for VT requiring ATP therapy - update amio labs today No further VT since starting amiodarone Continues to have brief NSVT episodes, asymptotic  Last stress test 12/2021 with fixed lesion consistent with old scar Patient reports increased DOE, dizziness relieved with nitro Also reports central chest "cramping" that could be anginal equivalent Both types of episodes seem to be worsening in severity Recommended LHC to eval  Discussed risks/benefits of procedure.  Patient verbalized understanding and wished to proceed. Will be scheduled with Dr. Jordan.   #) HFrEF Euvolemic on exam Entresto refill provided  NYHA class III symptoms   #) HTN Restarted entresto as above   Current medicines are reviewed at length with the patient today.   The patient does not have concerns regarding his medicines.  The following changes were made today:  none  Labs/ tests ordered today include:  Orders Placed This Encounter  Procedures   Comprehensive metabolic panel   TSH   T4, free   MYOCARDIAL PERFUSION IMAGING     Disposition: Follow up with Dr. Camnitz in in 6 months   Signed, Jahayra Mazo, NP  12/04/22  5:20 PM  Electrophysiology [facility]  

## 2022-11-29 DIAGNOSIS — Z95810 Presence of automatic (implantable) cardiac defibrillator: Secondary | ICD-10-CM | POA: Insufficient documentation

## 2022-12-04 ENCOUNTER — Ambulatory Visit: Payer: Self-pay | Attending: Physician Assistant | Admitting: Cardiology

## 2022-12-04 ENCOUNTER — Other Ambulatory Visit: Payer: Self-pay

## 2022-12-04 ENCOUNTER — Encounter: Payer: Self-pay | Admitting: Cardiology

## 2022-12-04 VITALS — BP 140/80 | HR 85 | Ht 68.0 in | Wt 146.4 lb

## 2022-12-04 DIAGNOSIS — R0609 Other forms of dyspnea: Secondary | ICD-10-CM

## 2022-12-04 DIAGNOSIS — R079 Chest pain, unspecified: Secondary | ICD-10-CM

## 2022-12-04 DIAGNOSIS — I5042 Chronic combined systolic (congestive) and diastolic (congestive) heart failure: Secondary | ICD-10-CM

## 2022-12-04 DIAGNOSIS — I4729 Other ventricular tachycardia: Secondary | ICD-10-CM

## 2022-12-04 DIAGNOSIS — I10 Essential (primary) hypertension: Secondary | ICD-10-CM

## 2022-12-04 DIAGNOSIS — Z95810 Presence of automatic (implantable) cardiac defibrillator: Secondary | ICD-10-CM

## 2022-12-04 DIAGNOSIS — I255 Ischemic cardiomyopathy: Secondary | ICD-10-CM

## 2022-12-04 LAB — CUP PACEART INCLINIC DEVICE CHECK
Date Time Interrogation Session: 20240326173210
Implantable Lead Connection Status: 753985
Implantable Lead Connection Status: 753985
Implantable Lead Connection Status: 753985
Implantable Lead Implant Date: 20151203
Implantable Lead Implant Date: 20151203
Implantable Lead Implant Date: 20151203
Implantable Lead Location: 753858
Implantable Lead Location: 753859
Implantable Lead Location: 753860
Implantable Lead Model: 4598
Implantable Lead Model: 5076
Implantable Lead Model: 6935
Implantable Pulse Generator Implant Date: 20221129

## 2022-12-04 MED ORDER — SACUBITRIL-VALSARTAN 97-103 MG PO TABS
1.0000 | ORAL_TABLET | Freq: Two times a day (BID) | ORAL | 3 refills | Status: DC
  Filled 2022-12-04: qty 180, 90d supply, fill #0

## 2022-12-04 NOTE — Patient Instructions (Addendum)
  Medication Instructions:   Your physician recommends that you continue on your current medications as directed. Please refer to the Current Medication list given to you today.    *If you need a refill on your cardiac medications before your next appointment, please call your pharmacy*   Lab Work:  CMET TSH AND FREE T4 TODAY   If you have labs (blood work) drawn today and your tests are completely normal, you will receive your results only by: [place] (if you have MyChart) OR A paper copy in the mail If you have any lab test that is abnormal or we need to change your treatment, we will call you to review the results.   Testing/Procedures: Your physician has requested that you have a lexiscan myoview. For further information please visit [email or web address]. Please follow instruction sheet, as given.     Follow-Up: At [facility], you and your health needs are our priority.  As part of our continuing mission to provide you with exceptional heart care, we have created designated Provider Care Teams.  These Care Teams include your primary Cardiologist (physician) and Advanced Practice Providers (APPs -  Physician Assistants and Nurse Practitioners) who all work together to provide you with the care you need, when you need it.  We recommend signing up for the patient portal called "MyChart".  Sign up information is provided on this After Visit Summary.  MyChart is used to connect with patients for Virtual Visits (Telemedicine).  Patients are able to view lab/test results, encounter notes, upcoming appointments, etc.  Non-urgent messages can be sent to your provider as well.   To learn more about what you can do with MyChart, go to [email or web address].    Your next appointment:   6 month(s)  Provider:   Allegra Lai, MD    Other Instructions

## 2022-12-05 ENCOUNTER — Encounter: Payer: Self-pay | Admitting: *Deleted

## 2022-12-05 ENCOUNTER — Telehealth: Payer: Self-pay | Admitting: *Deleted

## 2022-12-05 DIAGNOSIS — R079 Chest pain, unspecified: Secondary | ICD-10-CM

## 2022-12-05 LAB — TSH: TSH: 9.77 u[IU]/mL — ABNORMAL HIGH (ref 0.450–4.500)

## 2022-12-05 LAB — COMPREHENSIVE METABOLIC PANEL
ALT: 9 IU/L (ref 0–44)
AST: 20 IU/L (ref 0–40)
Albumin/Globulin Ratio: 1.4 (ref 1.2–2.2)
Albumin: 4.4 g/dL (ref 3.8–4.8)
Alkaline Phosphatase: 129 IU/L — ABNORMAL HIGH (ref 44–121)
BUN/Creatinine Ratio: 12 (ref 10–24)
BUN: 16 mg/dL (ref 8–27)
Bilirubin Total: 0.3 mg/dL (ref 0.0–1.2)
CO2: 21 mmol/L (ref 20–29)
Calcium: 9.2 mg/dL (ref 8.6–10.2)
Chloride: 102 mmol/L (ref 96–106)
Creatinine, Ser: 1.29 mg/dL — ABNORMAL HIGH (ref 0.76–1.27)
Globulin, Total: 3.2 g/dL (ref 1.5–4.5)
Glucose: 92 mg/dL (ref 70–99)
Potassium: 4.1 mmol/L (ref 3.5–5.2)
Sodium: 143 mmol/L (ref 134–144)
Total Protein: 7.6 g/dL (ref 6.0–8.5)
eGFR: 56 mL/min/{1.73_m2} — ABNORMAL LOW (ref >59)

## 2022-12-05 LAB — T4, FREE: Free T4: 1.21 ng/dL (ref 0.82–1.77)

## 2022-12-05 NOTE — Telephone Encounter (Signed)
Spoke with patient who is aware of L.Heart Cath on Apr 1st and to pickup letter with instructions after lab CBC he needs to be dawn tommorow.

## 2022-12-06 ENCOUNTER — Other Ambulatory Visit: Payer: Self-pay

## 2022-12-06 ENCOUNTER — Ambulatory Visit: Payer: Self-pay | Attending: Cardiology

## 2022-12-06 ENCOUNTER — Encounter (HOSPITAL_COMMUNITY): Payer: Self-pay

## 2022-12-06 ENCOUNTER — Emergency Department (HOSPITAL_COMMUNITY)
Admission: EM | Admit: 2022-12-06 | Discharge: 2022-12-06 | Disposition: A | Discharge status: Home / Self Care | Payer: No Typology Code available for payment source | Attending: Emergency Medicine | Admitting: Emergency Medicine

## 2022-12-06 ENCOUNTER — Emergency Department (HOSPITAL_COMMUNITY): Payer: No Typology Code available for payment source

## 2022-12-06 ENCOUNTER — Encounter: Payer: Self-pay | Admitting: Cardiology

## 2022-12-06 DIAGNOSIS — M7918 Myalgia, other site: Secondary | ICD-10-CM

## 2022-12-06 DIAGNOSIS — M791 Myalgia, unspecified site: Secondary | ICD-10-CM | POA: Diagnosis not present

## 2022-12-06 DIAGNOSIS — Z79899 Other long term (current) drug therapy: Secondary | ICD-10-CM | POA: Insufficient documentation

## 2022-12-06 DIAGNOSIS — S0990XA Unspecified injury of head, initial encounter: Secondary | ICD-10-CM | POA: Diagnosis not present

## 2022-12-06 DIAGNOSIS — I7143 Infrarenal abdominal aortic aneurysm, without rupture: Secondary | ICD-10-CM | POA: Diagnosis not present

## 2022-12-06 DIAGNOSIS — M542 Cervicalgia: Secondary | ICD-10-CM | POA: Insufficient documentation

## 2022-12-06 DIAGNOSIS — Z7902 Long term (current) use of antithrombotics/antiplatelets: Secondary | ICD-10-CM | POA: Diagnosis not present

## 2022-12-06 DIAGNOSIS — Y92410 Unspecified street and highway as the place of occurrence of the external cause: Secondary | ICD-10-CM | POA: Diagnosis not present

## 2022-12-06 DIAGNOSIS — S20301A Unspecified superficial injuries of right front wall of thorax, initial encounter: Secondary | ICD-10-CM | POA: Diagnosis present

## 2022-12-06 DIAGNOSIS — R079 Chest pain, unspecified: Secondary | ICD-10-CM

## 2022-12-06 DIAGNOSIS — S20211A Contusion of right front wall of thorax, initial encounter: Secondary | ICD-10-CM | POA: Diagnosis not present

## 2022-12-06 LAB — COMPREHENSIVE METABOLIC PANEL
ALT: 14 U/L (ref 0–44)
AST: 21 U/L (ref 15–41)
Albumin: 4.4 g/dL (ref 3.5–5.0)
Alkaline Phosphatase: 108 U/L (ref 38–126)
Anion gap: 12 (ref 5–15)
BUN: 12 mg/dL (ref 8–23)
CO2: 23 mmol/L (ref 22–32)
Calcium: 9.4 mg/dL (ref 8.9–10.3)
Chloride: 106 mmol/L (ref 98–111)
Creatinine, Ser: 1.1 mg/dL (ref 0.61–1.24)
GFR, Estimated: >60 mL/min (ref >60)
Glucose, Bld: 99 mg/dL (ref 70–99)
Potassium: 3.7 mmol/L (ref 3.5–5.1)
Sodium: 141 mmol/L (ref 135–145)
Total Bilirubin: 0.8 mg/dL (ref 0.3–1.2)
Total Protein: 8.3 g/dL — ABNORMAL HIGH (ref 6.5–8.1)

## 2022-12-06 LAB — CBC
HCT: 41.3 % (ref 39.0–52.0)
Hematocrit: 37.5 % (ref 37.5–51.0)
Hemoglobin: 12.4 g/dL — ABNORMAL LOW (ref 13.0–17.7)
Hemoglobin: 13.1 g/dL (ref 13.0–17.0)
MCH: 28.2 pg (ref 26.6–33.0)
MCH: 28.4 pg (ref 26.0–34.0)
MCHC: 33.1 g/dL (ref 31.5–35.7)
MCHC: 31.7 g/dL (ref 30.0–36.0)
MCV: 85 fL (ref 79–97)
MCV: 89.6 fL (ref 80.0–100.0)
Platelets: 192 10*3/uL (ref 150–450)
Platelets: 194 10*3/uL (ref 150–400)
RBC: 4.4 x10E6/uL (ref 4.14–5.80)
RBC: 4.61 MIL/uL (ref 4.22–5.81)
RDW: 13.6 % (ref 11.6–15.4)
RDW: 13.9 % (ref 11.5–15.5)
WBC: 5.4 10*3/uL (ref 3.4–10.8)
WBC: 7.1 10*3/uL (ref 4.0–10.5)
nRBC: 0 % (ref 0.0–0.2)

## 2022-12-06 LAB — I-STAT CHEM 8, ED
BUN: 14 mg/dL (ref 8–23)
Calcium, Ion: 1.18 mmol/L (ref 1.15–1.40)
Chloride: 106 mmol/L (ref 98–111)
Creatinine, Ser: 1.1 mg/dL (ref 0.61–1.24)
Glucose, Bld: 92 mg/dL (ref 70–99)
HCT: 43 % (ref 39.0–52.0)
Hemoglobin: 14.6 g/dL (ref 13.0–17.0)
Potassium: 3.8 mmol/L (ref 3.5–5.1)
Sodium: 142 mmol/L (ref 135–145)
TCO2: 25 mmol/L (ref 22–32)

## 2022-12-06 LAB — SAMPLE TO BLOOD BANK

## 2022-12-06 MED ORDER — IOHEXOL 350 MG/ML SOLN
75.0000 mL | Freq: Once | INTRAVENOUS | Status: AC | PRN
  Administered 2022-12-06: 75 mL via INTRAVENOUS
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
## 2022-12-07 ENCOUNTER — Encounter (HOSPITAL_COMMUNITY): Payer: Self-pay

## 2022-12-07 ENCOUNTER — Other Ambulatory Visit: Payer: Self-pay

## 2022-12-07 ENCOUNTER — Telehealth: Payer: Self-pay | Admitting: Cardiology

## 2022-12-07 DIAGNOSIS — Z79899 Other long term (current) drug therapy: Secondary | ICD-10-CM

## 2022-12-07 DIAGNOSIS — Z5181 Encounter for therapeutic drug level monitoring: Secondary | ICD-10-CM

## 2022-12-07 NOTE — Telephone Encounter (Signed)
Called and spoke with patient who verbalized understanding. Repeat TSH and T4 orders placed at this time and schedule for 02/23/23.

## 2022-12-07 NOTE — Telephone Encounter (Signed)
-----   Message from Mamie Levers, NP sent at 12/07/2022  9:44 AM EDT ----- Patient's thyroid lab (TSH) is a little elevated, likely because of the amiodarone.  No change to medication at this time. Needs repeat TSH and T4 in about 2-3 months.

## 2022-12-07 NOTE — Progress Notes (Signed)
Remote ICD transmission.   

## 2022-12-10 ENCOUNTER — Encounter (HOSPITAL_COMMUNITY)
Admission: RE | Disposition: A | Discharge status: Home / Self Care | Payer: Self-pay | Source: Home / Self Care | Attending: Cardiology

## 2022-12-10 ENCOUNTER — Ambulatory Visit (HOSPITAL_COMMUNITY): Payer: Medicare Other

## 2022-12-10 ENCOUNTER — Encounter (HOSPITAL_COMMUNITY): Payer: Self-pay | Admitting: Cardiology

## 2022-12-10 ENCOUNTER — Inpatient Hospital Stay (HOSPITAL_COMMUNITY)
Admission: RE | Admit: 2022-12-10 | Discharge: 2022-12-14 | DRG: 321 | Disposition: A | Discharge status: Home / Self Care | Payer: Medicare Other | Attending: Cardiology | Admitting: Cardiology

## 2022-12-10 ENCOUNTER — Other Ambulatory Visit: Payer: Self-pay

## 2022-12-10 DIAGNOSIS — I428 Other cardiomyopathies: Secondary | ICD-10-CM | POA: Diagnosis present

## 2022-12-10 DIAGNOSIS — I21A9 Other myocardial infarction type: Secondary | ICD-10-CM | POA: Diagnosis not present

## 2022-12-10 DIAGNOSIS — I238 Other current complications following acute myocardial infarction: Secondary | ICD-10-CM

## 2022-12-10 DIAGNOSIS — Z7902 Long term (current) use of antithrombotics/antiplatelets: Secondary | ICD-10-CM

## 2022-12-10 DIAGNOSIS — Z955 Presence of coronary angioplasty implant and graft: Secondary | ICD-10-CM

## 2022-12-10 DIAGNOSIS — I480 Paroxysmal atrial fibrillation: Secondary | ICD-10-CM | POA: Diagnosis present

## 2022-12-10 DIAGNOSIS — Z95810 Presence of automatic (implantable) cardiac defibrillator: Secondary | ICD-10-CM

## 2022-12-10 DIAGNOSIS — Z7951 Long term (current) use of inhaled steroids: Secondary | ICD-10-CM

## 2022-12-10 DIAGNOSIS — M545 Low back pain, unspecified: Secondary | ICD-10-CM | POA: Diagnosis present

## 2022-12-10 DIAGNOSIS — Z888 Allergy status to other drugs, medicaments and biological substances status: Secondary | ICD-10-CM

## 2022-12-10 DIAGNOSIS — Z0000 Encounter for general adult medical examination without abnormal findings: Secondary | ICD-10-CM

## 2022-12-10 DIAGNOSIS — E785 Hyperlipidemia, unspecified: Secondary | ICD-10-CM

## 2022-12-10 DIAGNOSIS — Z91199 Patient's noncompliance with other medical treatment and regimen due to unspecified reason: Secondary | ICD-10-CM

## 2022-12-10 DIAGNOSIS — K5900 Constipation, unspecified: Secondary | ICD-10-CM | POA: Diagnosis present

## 2022-12-10 DIAGNOSIS — I255 Ischemic cardiomyopathy: Secondary | ICD-10-CM | POA: Diagnosis present

## 2022-12-10 DIAGNOSIS — Z87891 Personal history of nicotine dependence: Secondary | ICD-10-CM

## 2022-12-10 DIAGNOSIS — R079 Chest pain, unspecified: Secondary | ICD-10-CM | POA: Diagnosis present

## 2022-12-10 DIAGNOSIS — Z886 Allergy status to analgesic agent status: Secondary | ICD-10-CM

## 2022-12-10 DIAGNOSIS — J4489 Other specified chronic obstructive pulmonary disease: Secondary | ICD-10-CM | POA: Diagnosis present

## 2022-12-10 DIAGNOSIS — I5042 Chronic combined systolic (congestive) and diastolic (congestive) heart failure: Secondary | ICD-10-CM | POA: Diagnosis present

## 2022-12-10 DIAGNOSIS — I447 Left bundle-branch block, unspecified: Secondary | ICD-10-CM | POA: Diagnosis present

## 2022-12-10 DIAGNOSIS — Z79899 Other long term (current) drug therapy: Secondary | ICD-10-CM

## 2022-12-10 DIAGNOSIS — G8929 Other chronic pain: Secondary | ICD-10-CM | POA: Diagnosis present

## 2022-12-10 DIAGNOSIS — I472 Ventricular tachycardia, unspecified: Secondary | ICD-10-CM | POA: Diagnosis present

## 2022-12-10 DIAGNOSIS — E782 Mixed hyperlipidemia: Secondary | ICD-10-CM | POA: Diagnosis present

## 2022-12-10 DIAGNOSIS — N179 Acute kidney failure, unspecified: Secondary | ICD-10-CM | POA: Diagnosis present

## 2022-12-10 DIAGNOSIS — I25110 Atherosclerotic heart disease of native coronary artery with unstable angina pectoris: Principal | ICD-10-CM | POA: Diagnosis present

## 2022-12-10 DIAGNOSIS — G4733 Obstructive sleep apnea (adult) (pediatric): Secondary | ICD-10-CM | POA: Diagnosis present

## 2022-12-10 DIAGNOSIS — I130 Hypertensive heart and chronic kidney disease with heart failure and stage 1 through stage 4 chronic kidney disease, or unspecified chronic kidney disease: Secondary | ICD-10-CM | POA: Diagnosis present

## 2022-12-10 DIAGNOSIS — I200 Unstable angina: Principal | ICD-10-CM | POA: Diagnosis present

## 2022-12-10 HISTORY — PX: CORONARY ULTRASOUND/IVUS: CATH118244

## 2022-12-10 HISTORY — PX: CORONARY STENT INTERVENTION: CATH118234

## 2022-12-10 HISTORY — PX: LEFT HEART CATH AND CORONARY ANGIOGRAPHY: CATH118249

## 2022-12-10 LAB — POCT ACTIVATED CLOTTING TIME
Activated Clotting Time: 277 seconds
Activated Clotting Time: 266 seconds

## 2022-12-10 SURGERY — CORONARY STENT INTERVENTION
Anesthesia: LOCAL

## 2022-12-10 MED ORDER — ATORVASTATIN CALCIUM 80 MG PO TABS
80.0000 mg | ORAL_TABLET | Freq: Every day | ORAL | Status: DC
  Administered 2022-12-10 – 2022-12-14 (×4): 80 mg via ORAL
  Filled 2022-12-10 (×4): qty 1

## 2022-12-10 MED ORDER — SODIUM CHLORIDE 0.9 % IV SOLN: 250.0000 mL | INTRAVENOUS | Status: DC | PRN

## 2022-12-10 MED ORDER — NITROGLYCERIN 1 MG/10 ML FOR IR/CATH LAB
INTRA_ARTERIAL | Status: DC | PRN
  Administered 2022-12-10: 200 ug via INTRACORONARY

## 2022-12-10 MED ORDER — MIDAZOLAM HCL 2 MG/2ML IJ SOLN
INTRAMUSCULAR | Status: DC | PRN
  Administered 2022-12-10 (×2): 1 mg via INTRAVENOUS

## 2022-12-10 MED ORDER — CLOPIDOGREL BISULFATE 75 MG PO TABS
75.0000 mg | ORAL_TABLET | Freq: Every day | ORAL | Status: DC
  Administered 2022-12-11 – 2022-12-12 (×2): 75 mg via ORAL
  Filled 2022-12-10 (×2): qty 1

## 2022-12-10 MED ORDER — ASPIRIN 81 MG PO CHEW
CHEWABLE_TABLET | ORAL | Status: AC
  Filled 2022-12-10: qty 1

## 2022-12-10 MED ORDER — SODIUM CHLORIDE 0.9 % IV SOLN: INTRAVENOUS | Status: DC

## 2022-12-10 MED ORDER — SACUBITRIL-VALSARTAN 97-103 MG PO TABS
1.0000 | ORAL_TABLET | Freq: Two times a day (BID) | ORAL | Status: DC
  Administered 2022-12-10 (×2): 1 via ORAL
  Filled 2022-12-10 (×3): qty 1

## 2022-12-10 MED ORDER — AMIODARONE HCL 200 MG PO TABS
200.0000 mg | ORAL_TABLET | Freq: Every day | ORAL | Status: DC
  Administered 2022-12-10 – 2022-12-14 (×5): 200 mg via ORAL
  Filled 2022-12-10 (×5): qty 1

## 2022-12-10 MED ORDER — NITROGLYCERIN 0.4 MG SL SUBL
0.4000 mg | SUBLINGUAL_TABLET | SUBLINGUAL | Status: DC | PRN
  Administered 2022-12-13: 0.4 mg via SUBLINGUAL
  Filled 2022-12-10: qty 1

## 2022-12-10 MED ORDER — HYDRALAZINE HCL 20 MG/ML IJ SOLN: 10.0000 mg | INTRAMUSCULAR | Status: AC | PRN

## 2022-12-10 MED ORDER — MOMETASONE FURO-FORMOTEROL FUM 200-5 MCG/ACT IN AERO
2.0000 | INHALATION_SPRAY | Freq: Two times a day (BID) | RESPIRATORY_TRACT | Status: DC
  Administered 2022-12-10 – 2022-12-13 (×6): 2 via RESPIRATORY_TRACT
  Filled 2022-12-10: qty 8.8

## 2022-12-10 MED ORDER — ASPIRIN 81 MG PO CHEW
81.0000 mg | CHEWABLE_TABLET | Freq: Once | ORAL | Status: AC
  Administered 2022-12-10: 81 mg via ORAL

## 2022-12-10 MED ORDER — FAMOTIDINE 20 MG PO TABS
40.0000 mg | ORAL_TABLET | Freq: Every day | ORAL | Status: DC
  Administered 2022-12-10 – 2022-12-14 (×5): 40 mg via ORAL
  Filled 2022-12-10 (×5): qty 2

## 2022-12-10 MED ORDER — VERAPAMIL HCL 2.5 MG/ML IV SOLN
INTRAVENOUS | Status: DC | PRN
  Administered 2022-12-10: 10 mL via INTRA_ARTERIAL

## 2022-12-10 MED ORDER — LABETALOL HCL 5 MG/ML IV SOLN: 10.0000 mg | INTRAVENOUS | Status: AC | PRN

## 2022-12-10 MED ORDER — CLOPIDOGREL BISULFATE 75 MG PO TABS: 75.0000 mg | ORAL_TABLET | ORAL | Status: DC

## 2022-12-10 MED ORDER — SODIUM CHLORIDE 0.9% FLUSH
3.0000 mL | Freq: Two times a day (BID) | INTRAVENOUS | Status: DC
  Administered 2022-12-10 – 2022-12-13 (×3): 3 mL via INTRAVENOUS

## 2022-12-10 MED ORDER — HEPARIN (PORCINE) IN NACL 1000-0.9 UT/500ML-% IV SOLN
INTRAVENOUS | Status: DC | PRN
  Administered 2022-12-10: 500 mL

## 2022-12-10 MED ORDER — SODIUM CHLORIDE 0.9% FLUSH: 3.0000 mL | INTRAVENOUS | Status: DC | PRN

## 2022-12-10 MED ORDER — MIDAZOLAM HCL 2 MG/2ML IJ SOLN
INTRAMUSCULAR | Status: AC
  Filled 2022-12-10: qty 2

## 2022-12-10 MED ORDER — VERAPAMIL HCL 2.5 MG/ML IV SOLN
INTRAVENOUS | Status: AC
  Filled 2022-12-10: qty 2

## 2022-12-10 MED ORDER — ISOSORBIDE MONONITRATE ER 60 MG PO TB24
60.0000 mg | ORAL_TABLET | Freq: Every day | ORAL | Status: DC
  Administered 2022-12-10 – 2022-12-12 (×3): 60 mg via ORAL
  Filled 2022-12-10 (×3): qty 1

## 2022-12-10 MED ORDER — AMLODIPINE BESYLATE 10 MG PO TABS
10.0000 mg | ORAL_TABLET | Freq: Every day | ORAL | Status: DC
  Administered 2022-12-11 – 2022-12-14 (×4): 10 mg via ORAL
  Filled 2022-12-10 (×4): qty 1

## 2022-12-10 MED ORDER — SODIUM CHLORIDE 0.9% FLUSH
3.0000 mL | Freq: Two times a day (BID) | INTRAVENOUS | Status: DC
  Administered 2022-12-10 – 2022-12-12 (×3): 3 mL via INTRAVENOUS

## 2022-12-10 MED ORDER — FENTANYL CITRATE (PF) 100 MCG/2ML IJ SOLN
INTRAMUSCULAR | Status: AC
  Filled 2022-12-10: qty 2

## 2022-12-10 MED ORDER — HEPARIN (PORCINE) IN NACL 1000-0.9 UT/500ML-% IV SOLN
INTRAVENOUS | Status: DC | PRN
  Administered 2022-12-10 – 2022-12-12 (×4): 500 mL

## 2022-12-10 MED ORDER — METOPROLOL TARTRATE 100 MG PO TABS
100.0000 mg | ORAL_TABLET | Freq: Two times a day (BID) | ORAL | Status: AC
  Administered 2022-12-10 – 2022-12-11 (×4): 100 mg via ORAL
  Filled 2022-12-10 (×4): qty 1

## 2022-12-10 MED ORDER — SODIUM CHLORIDE 0.9 % IV SOLN: INTRAVENOUS | Status: AC

## 2022-12-10 MED ORDER — ACETAMINOPHEN 325 MG PO TABS
650.0000 mg | ORAL_TABLET | ORAL | Status: DC | PRN
  Administered 2022-12-10 – 2022-12-13 (×4): 650 mg via ORAL
  Filled 2022-12-10 (×4): qty 2

## 2022-12-10 MED ORDER — HEPARIN SODIUM (PORCINE) 1000 UNIT/ML IJ SOLN
INTRAMUSCULAR | Status: DC | PRN
  Administered 2022-12-10: 2000 [IU] via INTRAVENOUS
  Administered 2022-12-10: 3500 [IU] via INTRAVENOUS
  Administered 2022-12-10: 3000 [IU] via INTRAVENOUS
  Administered 2022-12-10: 3500 [IU] via INTRAVENOUS

## 2022-12-10 MED ORDER — ONDANSETRON HCL 4 MG/2ML IJ SOLN: 4.0000 mg | Freq: Four times a day (QID) | INTRAMUSCULAR | Status: DC | PRN

## 2022-12-10 MED ORDER — LIDOCAINE HCL (PF) 1 % IJ SOLN
INTRAMUSCULAR | Status: AC
  Filled 2022-12-10: qty 30

## 2022-12-10 MED ORDER — ALBUTEROL SULFATE (2.5 MG/3ML) 0.083% IN NEBU
3.0000 mL | INHALATION_SOLUTION | Freq: Four times a day (QID) | RESPIRATORY_TRACT | Status: DC | PRN
  Administered 2022-12-13: 3 mL via RESPIRATORY_TRACT
  Filled 2022-12-10: qty 3

## 2022-12-10 MED ORDER — NITROGLYCERIN 1 MG/10 ML FOR IR/CATH LAB
INTRA_ARTERIAL | Status: AC
  Filled 2022-12-10: qty 10

## 2022-12-10 MED ORDER — LIDOCAINE HCL (PF) 1 % IJ SOLN
INTRAMUSCULAR | Status: DC | PRN
  Administered 2022-12-10: 2 mL

## 2022-12-10 MED ORDER — NITROGLYCERIN 0.4 MG SL SUBL
SUBLINGUAL_TABLET | SUBLINGUAL | Status: AC
  Administered 2022-12-10: 0.4 mg
  Filled 2022-12-10: qty 1

## 2022-12-10 MED ORDER — POTASSIUM CHLORIDE CRYS ER 20 MEQ PO TBCR
40.0000 meq | EXTENDED_RELEASE_TABLET | Freq: Every day | ORAL | Status: DC
  Administered 2022-12-10 – 2022-12-13 (×4): 40 meq via ORAL
  Filled 2022-12-10 (×4): qty 2

## 2022-12-10 MED ORDER — HEPARIN SODIUM (PORCINE) 1000 UNIT/ML IJ SOLN
INTRAMUSCULAR | Status: AC
  Filled 2022-12-10: qty 10

## 2022-12-10 MED ORDER — SODIUM CHLORIDE 0.9% FLUSH
3.0000 mL | Freq: Two times a day (BID) | INTRAVENOUS | Status: DC
  Administered 2022-12-10 – 2022-12-12 (×4): 3 mL via INTRAVENOUS

## 2022-12-10 MED ORDER — FENTANYL CITRATE (PF) 100 MCG/2ML IJ SOLN
INTRAMUSCULAR | Status: DC | PRN
  Administered 2022-12-10 (×2): 25 ug via INTRAVENOUS

## 2022-12-10 MED ORDER — IOHEXOL 350 MG/ML SOLN
INTRAVENOUS | Status: DC | PRN
  Administered 2022-12-10: 110 mL
  Administered 2022-12-12: 70 mL

## 2022-12-10 SURGICAL SUPPLY — 22 items
BALLN EMERGE MR 2.5X15 (BALLOONS) ×1
BALLN ~~LOC~~ EMERGE MR 4.0X20 (BALLOONS) ×1
BALLOON EMERGE MR 2.5X15 (BALLOONS) IMPLANT
BALLOON ~~LOC~~ EMERGE MR 4.0X20 (BALLOONS) IMPLANT
CATH 5FR JL3.5 JR4 ANG PIG MP (CATHETERS) IMPLANT
CATH OPTICROSS HD (CATHETERS) IMPLANT
CATH VISTA GUIDE 6FR XBLAD3.5 (CATHETERS) IMPLANT
DEVICE RAD COMP TR BAND LRG (VASCULAR PRODUCTS) IMPLANT
GLIDESHEATH SLEND SS 6F .021 (SHEATH) IMPLANT
GUIDEWIRE INQWIRE 1.5J.035X260 (WIRE) IMPLANT
GUIDEWIRE PRESSURE X 175 (WIRE) IMPLANT
INQWIRE 1.5J .035X260CM (WIRE) ×1
KIT ENCORE 26 ADVANTAGE (KITS) IMPLANT
KIT HEART LEFT (KITS) ×1 IMPLANT
PACK CARDIAC CATHETERIZATION (CUSTOM PROCEDURE TRAY) ×1 IMPLANT
SHEATH PROBE COVER 6X72 (BAG) IMPLANT
SLED PULL BACK IVUS (MISCELLANEOUS) IMPLANT
STENT SYNERGY XD 3.50X38 (Permanent Stent) IMPLANT
SYNERGY XD 3.50X38 (Permanent Stent) ×1 IMPLANT
TRANSDUCER W/STOPCOCK (MISCELLANEOUS) ×1 IMPLANT
TUBING CIL FLEX 10 FLL-RA (TUBING) ×1 IMPLANT
WIRE ASAHI PROWATER 180CM (WIRE) IMPLANT

## 2022-12-10 NOTE — Progress Notes (Signed)
Cath lab called to reevaluate right radial site TR band. Pt reverse barbeau result was a D on the floor and any time air was attempted to be removed, site would ooze. Cath lab thinks pt ulnar artery is small in size causing such a small pleth to begin with. Once cath lab repositioned for reverse barbeau, result came back to a C. Told to keep TR band in place for another 30 mins before trying to release air again. Continuing to monitor radial site.

## 2022-12-10 NOTE — Interval H&P Note (Signed)
History and Physical Interval Note:  12/10/2022 12:17 PM  [name]  has presented today for surgery, with the diagnosis of chest pain.  The various methods of treatment have been discussed with the patient and family. After consideration of risks, benefits and other options for treatment, the patient has consented to  Procedure(s): LEFT HEART CATH AND CORONARY ANGIOGRAPHY (N/A) as a surgical intervention.  The patient's history has been reviewed, patient examined, no change in status, stable for surgery.  I have reviewed the patient's chart and labs.  Questions were answered to the patient's satisfaction.   Cath Lab Visit (complete for each Cath Lab visit)  Clinical Evaluation Leading to the Procedure:   ACS: No.  Non-ACS:    Anginal Classification: CCS III  Anti-ischemic medical therapy: Maximal Therapy (2 or more classes of medications)  Non-Invasive Test Results: No non-invasive testing performed  Prior CABG: No previous CABG        [name] [facility] 12/10/2022 12:18 PM

## 2022-12-11 ENCOUNTER — Other Ambulatory Visit (HOSPITAL_COMMUNITY): Payer: Self-pay

## 2022-12-11 ENCOUNTER — Other Ambulatory Visit: Payer: Self-pay

## 2022-12-11 ENCOUNTER — Ambulatory Visit (HOSPITAL_COMMUNITY): Payer: Medicare Other

## 2022-12-11 DIAGNOSIS — I447 Left bundle-branch block, unspecified: Secondary | ICD-10-CM | POA: Diagnosis present

## 2022-12-11 DIAGNOSIS — I200 Unstable angina: Secondary | ICD-10-CM

## 2022-12-11 DIAGNOSIS — G4733 Obstructive sleep apnea (adult) (pediatric): Secondary | ICD-10-CM | POA: Diagnosis present

## 2022-12-11 DIAGNOSIS — I255 Ischemic cardiomyopathy: Secondary | ICD-10-CM

## 2022-12-11 DIAGNOSIS — I480 Paroxysmal atrial fibrillation: Secondary | ICD-10-CM | POA: Diagnosis present

## 2022-12-11 DIAGNOSIS — G8929 Other chronic pain: Secondary | ICD-10-CM | POA: Diagnosis present

## 2022-12-11 DIAGNOSIS — K5900 Constipation, unspecified: Secondary | ICD-10-CM | POA: Diagnosis present

## 2022-12-11 DIAGNOSIS — I472 Ventricular tachycardia, unspecified: Secondary | ICD-10-CM

## 2022-12-11 DIAGNOSIS — I428 Other cardiomyopathies: Secondary | ICD-10-CM | POA: Diagnosis present

## 2022-12-11 DIAGNOSIS — N179 Acute kidney failure, unspecified: Secondary | ICD-10-CM | POA: Diagnosis present

## 2022-12-11 DIAGNOSIS — Z955 Presence of coronary angioplasty implant and graft: Secondary | ICD-10-CM | POA: Diagnosis not present

## 2022-12-11 DIAGNOSIS — I130 Hypertensive heart and chronic kidney disease with heart failure and stage 1 through stage 4 chronic kidney disease, or unspecified chronic kidney disease: Secondary | ICD-10-CM | POA: Diagnosis present

## 2022-12-11 DIAGNOSIS — E782 Mixed hyperlipidemia: Secondary | ICD-10-CM | POA: Diagnosis present

## 2022-12-11 DIAGNOSIS — I5042 Chronic combined systolic (congestive) and diastolic (congestive) heart failure: Secondary | ICD-10-CM | POA: Diagnosis present

## 2022-12-11 DIAGNOSIS — Z886 Allergy status to analgesic agent status: Secondary | ICD-10-CM | POA: Diagnosis not present

## 2022-12-11 DIAGNOSIS — J4489 Other specified chronic obstructive pulmonary disease: Secondary | ICD-10-CM | POA: Diagnosis present

## 2022-12-11 DIAGNOSIS — I25110 Atherosclerotic heart disease of native coronary artery with unstable angina pectoris: Secondary | ICD-10-CM | POA: Diagnosis present

## 2022-12-11 DIAGNOSIS — I2510 Atherosclerotic heart disease of native coronary artery without angina pectoris: Secondary | ICD-10-CM

## 2022-12-11 DIAGNOSIS — I21A9 Other myocardial infarction type: Secondary | ICD-10-CM | POA: Diagnosis not present

## 2022-12-11 DIAGNOSIS — Z87891 Personal history of nicotine dependence: Secondary | ICD-10-CM | POA: Diagnosis not present

## 2022-12-11 DIAGNOSIS — I10 Essential (primary) hypertension: Secondary | ICD-10-CM

## 2022-12-11 DIAGNOSIS — Z7902 Long term (current) use of antithrombotics/antiplatelets: Secondary | ICD-10-CM | POA: Diagnosis not present

## 2022-12-11 DIAGNOSIS — R079 Chest pain, unspecified: Secondary | ICD-10-CM | POA: Diagnosis present

## 2022-12-11 DIAGNOSIS — E785 Hyperlipidemia, unspecified: Secondary | ICD-10-CM

## 2022-12-11 DIAGNOSIS — Z888 Allergy status to other drugs, medicaments and biological substances status: Secondary | ICD-10-CM | POA: Diagnosis not present

## 2022-12-11 DIAGNOSIS — Z91199 Patient's noncompliance with other medical treatment and regimen due to unspecified reason: Secondary | ICD-10-CM | POA: Diagnosis not present

## 2022-12-11 DIAGNOSIS — M545 Low back pain, unspecified: Secondary | ICD-10-CM | POA: Diagnosis present

## 2022-12-11 DIAGNOSIS — Z95810 Presence of automatic (implantable) cardiac defibrillator: Secondary | ICD-10-CM | POA: Diagnosis not present

## 2022-12-11 DIAGNOSIS — Z79899 Other long term (current) drug therapy: Secondary | ICD-10-CM | POA: Diagnosis not present

## 2022-12-11 LAB — ECHOCARDIOGRAM COMPLETE
AR max vel: 4.45 cm2
AV Peak grad: 3.5 mmHg
Ao pk vel: 0.94 m/s
Area-P 1/2: 2.54 cm2
Height: 68.5 in
MV M vel: 5.85 m/s
MV Peak grad: 136.9 mmHg
S' Lateral: 3 cm
Single Plane A4C EF: 36.9 %
Weight: 2240 oz

## 2022-12-11 LAB — BASIC METABOLIC PANEL
Anion gap: 7 (ref 5–15)
BUN: 18 mg/dL (ref 8–23)
CO2: 24 mmol/L (ref 22–32)
Calcium: 8.4 mg/dL — ABNORMAL LOW (ref 8.9–10.3)
Chloride: 106 mmol/L (ref 98–111)
Creatinine, Ser: 1.43 mg/dL — ABNORMAL HIGH (ref 0.61–1.24)
GFR, Estimated: 50 mL/min — ABNORMAL LOW (ref >60)
Glucose, Bld: 104 mg/dL — ABNORMAL HIGH (ref 70–99)
Potassium: 4.4 mmol/L (ref 3.5–5.1)
Sodium: 137 mmol/L (ref 135–145)

## 2022-12-11 LAB — CBC
HCT: 33 % — ABNORMAL LOW (ref 39.0–52.0)
Hemoglobin: 10.6 g/dL — ABNORMAL LOW (ref 13.0–17.0)
MCH: 28.6 pg (ref 26.0–34.0)
MCHC: 32.1 g/dL (ref 30.0–36.0)
MCV: 89.2 fL (ref 80.0–100.0)
Platelets: 164 10*3/uL (ref 150–400)
RBC: 3.7 MIL/uL — ABNORMAL LOW (ref 4.22–5.81)
RDW: 14 % (ref 11.5–15.5)
WBC: 5.7 10*3/uL (ref 4.0–10.5)
nRBC: 0 % (ref 0.0–0.2)

## 2022-12-11 MED ORDER — SODIUM CHLORIDE 0.9 % IV SOLN: 250.0000 mL | INTRAVENOUS | Status: DC | PRN

## 2022-12-11 MED ORDER — SODIUM CHLORIDE 0.9% FLUSH: 3.0000 mL | INTRAVENOUS | Status: DC | PRN

## 2022-12-11 MED ORDER — LIDOCAINE 5 % EX PTCH
1.0000 | MEDICATED_PATCH | Freq: Every day | CUTANEOUS | Status: DC
  Administered 2022-12-11 – 2022-12-14 (×4): 1 via TRANSDERMAL
  Filled 2022-12-11 (×4): qty 1

## 2022-12-11 MED ORDER — SODIUM CHLORIDE 0.9 % WEIGHT BASED INFUSION
1.0000 mL/kg/h | INTRAVENOUS | Status: DC
  Administered 2022-12-11: 1 mL/kg/h via INTRAVENOUS

## 2022-12-11 MED ORDER — ASPIRIN 81 MG PO TBEC
81.0000 mg | DELAYED_RELEASE_TABLET | Freq: Every day | ORAL | Status: DC
  Administered 2022-12-11: 81 mg via ORAL
  Filled 2022-12-11 (×2): qty 1

## 2022-12-11 MED ORDER — ASPIRIN 81 MG PO CHEW
81.0000 mg | CHEWABLE_TABLET | Freq: Once | ORAL | Status: DC
  Filled 2022-12-11: qty 1

## 2022-12-11 MED ORDER — SODIUM CHLORIDE 0.9 % WEIGHT BASED INFUSION
3.0000 mL/kg/h | INTRAVENOUS | Status: AC
  Administered 2022-12-11: 3 mL/kg/h via INTRAVENOUS

## 2022-12-11 MED ORDER — SODIUM CHLORIDE 0.9 % WEIGHT BASED INFUSION: 1.0000 mL/kg/h | INTRAVENOUS | Status: DC

## 2022-12-11 MED ORDER — METOPROLOL SUCCINATE ER 50 MG PO TB24
200.0000 mg | ORAL_TABLET | Freq: Every day | ORAL | Status: DC
  Administered 2022-12-12 – 2022-12-14 (×3): 200 mg via ORAL
  Filled 2022-12-11: qty 4
  Filled 2022-12-11: qty 2
  Filled 2022-12-11: qty 4

## 2022-12-11 MED ORDER — SODIUM CHLORIDE 0.9 % WEIGHT BASED INFUSION: 3.0000 mL/kg/h | INTRAVENOUS | Status: DC

## 2022-12-11 MED ORDER — SODIUM CHLORIDE 0.9% FLUSH
3.0000 mL | Freq: Two times a day (BID) | INTRAVENOUS | Status: DC
  Administered 2022-12-11 – 2022-12-12 (×3): 3 mL via INTRAVENOUS
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
## 2022-12-11 NOTE — Progress Notes (Signed)
  Echocardiogram 2D Echocardiogram has been performed.  [name] 12/11/2022, 9:47 AM
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
## 2022-12-12 ENCOUNTER — Encounter (HOSPITAL_COMMUNITY)
Admission: RE | Disposition: A | Discharge status: Home / Self Care | Payer: Self-pay | Source: Home / Self Care | Attending: Cardiology

## 2022-12-12 HISTORY — PX: CORONARY STENT INTERVENTION: CATH118234

## 2022-12-12 HISTORY — PX: CORONARY ULTRASOUND/IVUS: CATH118244

## 2022-12-12 LAB — LIPID PANEL
Cholesterol: 166 mg/dL (ref 0–200)
HDL: 48 mg/dL (ref >40)
LDL Cholesterol: 94 mg/dL (ref 0–99)
Total CHOL/HDL Ratio: 3.5 RATIO
Triglycerides: 120 mg/dL (ref <150)
VLDL: 24 mg/dL (ref 0–40)

## 2022-12-12 LAB — BASIC METABOLIC PANEL
Anion gap: 8 (ref 5–15)
BUN: 17 mg/dL (ref 8–23)
CO2: 21 mmol/L — ABNORMAL LOW (ref 22–32)
Calcium: 8.5 mg/dL — ABNORMAL LOW (ref 8.9–10.3)
Chloride: 107 mmol/L (ref 98–111)
Creatinine, Ser: 1.27 mg/dL — ABNORMAL HIGH (ref 0.61–1.24)
GFR, Estimated: 57 mL/min — ABNORMAL LOW (ref >60)
Glucose, Bld: 94 mg/dL (ref 70–99)
Potassium: 4.2 mmol/L (ref 3.5–5.1)
Sodium: 136 mmol/L (ref 135–145)

## 2022-12-12 LAB — HEMOGLOBIN A1C
Hgb A1c MFr Bld: 6.1 % — ABNORMAL HIGH (ref 4.8–5.6)
Mean Plasma Glucose: 128 mg/dL

## 2022-12-12 LAB — POCT ACTIVATED CLOTTING TIME
Activated Clotting Time: 206 seconds
Activated Clotting Time: 363 seconds
Activated Clotting Time: 266 seconds
Activated Clotting Time: 277 seconds
Activated Clotting Time: 309 seconds

## 2022-12-12 LAB — LIPOPROTEIN A (LPA): Lipoprotein (a): 163 nmol/L — ABNORMAL HIGH (ref <75.0)

## 2022-12-12 LAB — TROPONIN I (HIGH SENSITIVITY)
Troponin I (High Sensitivity): 3161 ng/L (ref <18)
Troponin I (High Sensitivity): 2106 ng/L (ref <18)

## 2022-12-12 SURGERY — CORONARY STENT INTERVENTION
Anesthesia: LOCAL

## 2022-12-12 MED ORDER — FENTANYL CITRATE (PF) 100 MCG/2ML IJ SOLN
INTRAMUSCULAR | Status: AC
  Filled 2022-12-12: qty 2

## 2022-12-12 MED ORDER — SODIUM CHLORIDE 0.9% FLUSH: 3.0000 mL | Freq: Two times a day (BID) | INTRAVENOUS | Status: DC

## 2022-12-12 MED ORDER — SODIUM CHLORIDE 0.9 % IV SOLN: 250.0000 mL | INTRAVENOUS | Status: DC | PRN

## 2022-12-12 MED ORDER — NITROGLYCERIN IN D5W 200-5 MCG/ML-% IV SOLN
0.0000 ug/min | INTRAVENOUS | Status: DC
  Administered 2022-12-12: 100 ug/min via INTRAVENOUS

## 2022-12-12 MED ORDER — HEPARIN SODIUM (PORCINE) 1000 UNIT/ML IJ SOLN
INTRAMUSCULAR | Status: AC
  Filled 2022-12-12: qty 10

## 2022-12-12 MED ORDER — SODIUM CHLORIDE 0.9 % WEIGHT BASED INFUSION: 1.0000 mL/kg/h | INTRAVENOUS | Status: DC

## 2022-12-12 MED ORDER — SODIUM CHLORIDE 0.9% FLUSH: 3.0000 mL | INTRAVENOUS | Status: DC | PRN

## 2022-12-12 MED ORDER — SODIUM CHLORIDE 0.9 % WEIGHT BASED INFUSION
1.0000 mL/kg/h | INTRAVENOUS | Status: AC
  Administered 2022-12-12: 1 mL/kg/h via INTRAVENOUS

## 2022-12-12 MED ORDER — TICAGRELOR 90 MG PO TABS
90.0000 mg | ORAL_TABLET | Freq: Two times a day (BID) | ORAL | Status: DC
  Administered 2022-12-12 – 2022-12-14 (×3): 90 mg via ORAL
  Filled 2022-12-12 (×4): qty 1

## 2022-12-12 MED ORDER — NITROGLYCERIN IN D5W 200-5 MCG/ML-% IV SOLN
INTRAVENOUS | Status: AC | PRN
  Administered 2022-12-12: 5 ug/min via INTRAVENOUS

## 2022-12-12 MED ORDER — CHLORHEXIDINE GLUCONATE CLOTH 2 % EX PADS
6.0000 | MEDICATED_PAD | Freq: Every day | CUTANEOUS | Status: DC
  Administered 2022-12-12 – 2022-12-14 (×3): 6 via TOPICAL

## 2022-12-12 MED ORDER — MORPHINE SULFATE (PF) 2 MG/ML IV SOLN
2.0000 mg | INTRAVENOUS | Status: DC | PRN
  Administered 2022-12-12 – 2022-12-13 (×5): 2 mg via INTRAVENOUS
  Filled 2022-12-12 (×5): qty 1

## 2022-12-12 MED ORDER — HEPARIN SODIUM (PORCINE) 1000 UNIT/ML IJ SOLN
INTRAMUSCULAR | Status: DC | PRN
  Administered 2022-12-12 (×2): 3000 [IU] via INTRAVENOUS
  Administered 2022-12-12: 4000 [IU] via INTRAVENOUS
  Administered 2022-12-12: 6000 [IU] via INTRAVENOUS

## 2022-12-12 MED ORDER — LIDOCAINE HCL (PF) 1 % IJ SOLN
INTRAMUSCULAR | Status: AC
  Filled 2022-12-12: qty 30

## 2022-12-12 MED ORDER — MIDAZOLAM HCL 2 MG/2ML IJ SOLN
INTRAMUSCULAR | Status: DC | PRN
  Administered 2022-12-12 (×2): 1 mg via INTRAVENOUS

## 2022-12-12 MED ORDER — VERAPAMIL HCL 2.5 MG/ML IV SOLN
INTRAVENOUS | Status: AC
  Filled 2022-12-12: qty 2

## 2022-12-12 MED ORDER — SODIUM CHLORIDE 0.9 % WEIGHT BASED INFUSION: 3.0000 mL/kg/h | INTRAVENOUS | Status: DC

## 2022-12-12 MED ORDER — FENTANYL CITRATE (PF) 100 MCG/2ML IJ SOLN
INTRAMUSCULAR | Status: DC | PRN
  Administered 2022-12-12: 50 ug via INTRAVENOUS
  Administered 2022-12-12: 25 ug via INTRAVENOUS

## 2022-12-12 MED ORDER — VERAPAMIL HCL 2.5 MG/ML IV SOLN
INTRAVENOUS | Status: DC | PRN
  Administered 2022-12-12: 10 mL via INTRA_ARTERIAL

## 2022-12-12 MED ORDER — NITROGLYCERIN IN D5W 200-5 MCG/ML-% IV SOLN
INTRAVENOUS | Status: AC
  Filled 2022-12-12: qty 250

## 2022-12-12 MED ORDER — TICAGRELOR 90 MG PO TABS
ORAL_TABLET | ORAL | Status: AC
  Filled 2022-12-12: qty 2

## 2022-12-12 MED ORDER — NITROGLYCERIN 1 MG/10 ML FOR IR/CATH LAB
INTRA_ARTERIAL | Status: AC
  Filled 2022-12-12: qty 10

## 2022-12-12 MED ORDER — MIDAZOLAM HCL 2 MG/2ML IJ SOLN
INTRAMUSCULAR | Status: AC
  Filled 2022-12-12: qty 2

## 2022-12-12 MED ORDER — LIDOCAINE HCL (PF) 1 % IJ SOLN
INTRAMUSCULAR | Status: DC | PRN
  Administered 2022-12-12: 2 mL

## 2022-12-12 MED ORDER — TICAGRELOR 90 MG PO TABS
ORAL_TABLET | ORAL | Status: DC | PRN
  Administered 2022-12-12: 180 mg via ORAL

## 2022-12-12 MED ORDER — VERAPAMIL HCL 2.5 MG/ML IV SOLN
INTRAVENOUS | Status: DC | PRN
  Administered 2022-12-12 (×10): 200 ug via INTRACORONARY

## 2022-12-12 SURGICAL SUPPLY — 25 items
BALLN EMERGE MR 2.0X12 (BALLOONS) ×1
BALLN EMERGE MR 2.5X15 (BALLOONS) ×1
BALLN EMERGE MR 3.0X20 (BALLOONS) ×1
BALLOON EMERGE MR 2.0X12 (BALLOONS) IMPLANT
BALLOON EMERGE MR 2.5X15 (BALLOONS) IMPLANT
BALLOON EMERGE MR 3.0X20 (BALLOONS) IMPLANT
BALLOON TAKERU 1.5X12 (BALLOONS) IMPLANT
CATH GUIDELINER COAST (CATHETERS) IMPLANT
CATH LAUNCHER 6FR AL1 (CATHETERS) IMPLANT
CATH OPTICROSS HD (CATHETERS) IMPLANT
CATHETER LAUNCHER 6FR AL1 (CATHETERS) ×1
ELECT DEFIB PAD ADLT CADENCE (PAD) IMPLANT
GLIDESHEATH SLEND SS 6F .021 (SHEATH) IMPLANT
GUIDEWIRE INQWIRE 1.5J.035X260 (WIRE) IMPLANT
INQWIRE 1.5J .035X260CM (WIRE) ×1
KIT ENCORE 26 ADVANTAGE (KITS) IMPLANT
KIT HEART LEFT (KITS) ×1 IMPLANT
PACK CARDIAC CATHETERIZATION (CUSTOM PROCEDURE TRAY) ×1 IMPLANT
SLED PULL BACK IVUS (MISCELLANEOUS) IMPLANT
STENT ONYX FRONTIER 3.5X38 (Permanent Stent) IMPLANT
STENT SYNERGY XD 3.50X38 (Permanent Stent) IMPLANT
SYNERGY XD 3.50X38 (Permanent Stent) ×1 IMPLANT
TRANSDUCER W/STOPCOCK (MISCELLANEOUS) ×1 IMPLANT
TUBING CIL FLEX 10 FLL-RA (TUBING) ×1 IMPLANT
WIRE ASAHI PROWATER 180CM (WIRE) IMPLANT

## 2022-12-12 NOTE — Interval H&P Note (Signed)
History and Physical Interval Note:  12/12/2022 10:36 AM  [name]  has presented today for surgery, with the diagnosis of cad.  The various methods of treatment have been discussed with the patient and family. After consideration of risks, benefits and other options for treatment, the patient has consented to  Procedure(s): CORONARY STENT INTERVENTION (N/A) as a surgical intervention.  The patient's history has been reviewed, patient examined, no change in status, stable for surgery.  I have reviewed the patient's chart and labs.  Questions were answered to the patient's satisfaction.   Cath Lab Visit (complete for each Cath Lab visit)  Clinical Evaluation Leading to the Procedure:   ACS: Yes.    Non-ACS:    Anginal Classification: CCS III  Anti-ischemic medical therapy: Maximal Therapy (2 or more classes of medications)  Non-Invasive Test Results: No non-invasive testing performed  Prior CABG: No previous CABG        [name] 12/12/2022 10:36 AM

## 2022-12-12 NOTE — Progress Notes (Signed)
Will hold ambulation due to recurrent VT. Did assist pt to BR. Discussed with pt stent, importance of Brilinta, restrictions, and CRPII. Pt walks 10,000 steps a day normally. Gave HH diet. Will f/u tomorrow to finish ed. Will refer to [place] BS, ACSM-CEP 12/12/2022 9:58 AM

## 2022-12-12 NOTE — Progress Notes (Signed)
Re: Mr [name] had 15 bts Vtach episode. BP 129/79. Pt denies any dizziness, palpitation nor SOB. Continues to c/o of itching attributing it to aspirin intake. Dr Peter Martinique updated via secure chat.

## 2022-12-12 NOTE — Progress Notes (Signed)
   Heart Failure Stewardship Pharmacist Progress Note   PCP: Gildardo Pounds, NP PCP-Cardiologist: Peter Martinique, MD    HPI:  82 yo M with PMH of CAD, HFrEF, ICM, HTN, and HLD.   He was seen in the office on 3/26 after an episode of VT was seen on his device, also complaining of chest discomfort. He was set up for elective cardiac cath. Cath on 4/1 revealed 3 vessel obstructive CAD s/p PCI with DES to mid LAD. Planned staged PCI of prox and mid RCA on 4/3. Plan to treat PL and OM branch disease medically. ECHO on 4/2 showed LVEF 30-35%, global hypokinesis, severe LVH, and G1DD. Previous ECHO on 01/2022 also showed LVEF 35%.  4/3: 15 beats of VT this AM. Patient denies dizziness, palpitations, or shortness of breath.   Current HF Medications: Beta Blocker: metoprolol XL 200 mg daily  Prior to admission HF Medications: Beta blocker: metoprolol tartrate 100 mg BID ACE/ARB/ARNI: Entresto 97/103 mg BID  Pertinent Lab Values: Serum creatinine 1.27, BUN 17, Potassium 4.2, Sodium 136  Vital Signs: Weight: 142 lbs (admission weight: 146 lbs) Blood pressure: 120/70s  Heart rate: 60s  I/O: incomplete  Medication Assistance / Insurance Benefits Check: Does the patient have prescription insurance?  No  Does the patient qualify for medication assistance through manufacturers or grants?   Yes Eligible grants and/or patient assistance programs: [employer] Medication assistance applications in progress: none  Medication assistance applications approved: Eustace Moore Approved medication assistance renewals will be completed by: [place]:  Prior to admission outpatient pharmacy[facility] Is the patient willing to use [place] at discharge? Yes  Assessment: 1. 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 NYHA class II symptoms. - Not volume overloaded on exam, watch off diuretics. Keep K>4 and check Mg. - Continue metoprolol XL 200 mg  daily - Consider restarting Entresto at lower dose once staged PCI completed, AKI resolving - Did not tolerate spironolactone in the past, states that it made him feel poorly - Consider adding Jardiance 10 mg daily prior to discharge pending creatinine trends. Jardiance preferred over [place] for access in uninsured patients.    Plan: 1) Medication changes recommended at this time: - None pending cath today  2) Patient assistance: - Receives patient assistance for [employer] through [employer]. If updated script needs to be sent, send to [facility] in [place], [place] - Will need patient assistance for Jardiance if this is added  3)  Education  - Initial education completed - Full education be completed prior to discharge  Kerby Nora, PharmD, BCPS Heart Failure Stewardship Pharmacist Phone [phone]

## 2022-12-13 ENCOUNTER — Inpatient Hospital Stay (HOSPITAL_COMMUNITY): Payer: Medicare Other

## 2022-12-13 ENCOUNTER — Encounter (HOSPITAL_COMMUNITY): Payer: Self-pay | Admitting: Cardiology

## 2022-12-13 DIAGNOSIS — I5042 Chronic combined systolic (congestive) and diastolic (congestive) heart failure: Secondary | ICD-10-CM

## 2022-12-13 DIAGNOSIS — Z0000 Encounter for general adult medical examination without abnormal findings: Secondary | ICD-10-CM

## 2022-12-13 DIAGNOSIS — I238 Other current complications following acute myocardial infarction: Secondary | ICD-10-CM

## 2022-12-13 DIAGNOSIS — E782 Mixed hyperlipidemia: Secondary | ICD-10-CM

## 2022-12-13 DIAGNOSIS — Z955 Presence of coronary angioplasty implant and graft: Secondary | ICD-10-CM

## 2022-12-13 LAB — BASIC METABOLIC PANEL
Anion gap: 7 (ref 5–15)
BUN: 16 mg/dL (ref 8–23)
CO2: 21 mmol/L — ABNORMAL LOW (ref 22–32)
Calcium: 8.9 mg/dL (ref 8.9–10.3)
Chloride: 105 mmol/L (ref 98–111)
Creatinine, Ser: 1.24 mg/dL (ref 0.61–1.24)
GFR, Estimated: 59 mL/min — ABNORMAL LOW (ref >60)
Glucose, Bld: 94 mg/dL (ref 70–99)
Potassium: 4.5 mmol/L (ref 3.5–5.1)
Sodium: 133 mmol/L — ABNORMAL LOW (ref 135–145)

## 2022-12-13 LAB — CBC
HCT: 33.7 % — ABNORMAL LOW (ref 39.0–52.0)
Hemoglobin: 11.1 g/dL — ABNORMAL LOW (ref 13.0–17.0)
MCH: 28.8 pg (ref 26.0–34.0)
MCHC: 32.9 g/dL (ref 30.0–36.0)
MCV: 87.5 fL (ref 80.0–100.0)
Platelets: 170 10*3/uL (ref 150–400)
RBC: 3.85 MIL/uL — ABNORMAL LOW (ref 4.22–5.81)
RDW: 13.7 % (ref 11.5–15.5)
WBC: 6.4 10*3/uL (ref 4.0–10.5)
nRBC: 0 % (ref 0.0–0.2)

## 2022-12-13 LAB — TROPONIN I (HIGH SENSITIVITY): Troponin I (High Sensitivity): 8285 ng/L (ref <18)

## 2022-12-13 MED ORDER — RANOLAZINE ER 500 MG PO TB12
500.0000 mg | ORAL_TABLET | Freq: Two times a day (BID) | ORAL | Status: DC
  Administered 2022-12-13 – 2022-12-14 (×2): 500 mg via ORAL
  Filled 2022-12-13 (×4): qty 1

## 2022-12-13 MED ORDER — MOMETASONE FURO-FORMOTEROL FUM 200-5 MCG/ACT IN AERO
2.0000 | INHALATION_SPRAY | Freq: Two times a day (BID) | RESPIRATORY_TRACT | Status: DC
  Administered 2022-12-14: 2 via RESPIRATORY_TRACT
  Filled 2022-12-13: qty 8.8

## 2022-12-13 MED ORDER — SACUBITRIL-VALSARTAN 49-51 MG PO TABS
1.0000 | ORAL_TABLET | Freq: Once | ORAL | Status: DC
  Filled 2022-12-13: qty 1

## 2022-12-13 MED ORDER — HYDROMORPHONE HCL 1 MG/ML IJ SOLN
1.0000 mg | INTRAMUSCULAR | Status: DC | PRN
  Administered 2022-12-13 – 2022-12-14 (×4): 1 mg via INTRAVENOUS
  Filled 2022-12-13 (×4): qty 1

## 2022-12-13 MED ORDER — POLYETHYLENE GLYCOL 3350 17 G PO PACK
17.0000 g | PACK | Freq: Every day | ORAL | Status: DC
  Administered 2022-12-13 – 2022-12-14 (×2): 17 g via ORAL
  Filled 2022-12-13 (×2): qty 1

## 2022-12-13 MED ORDER — ASPIRIN 81 MG PO TBEC
81.0000 mg | DELAYED_RELEASE_TABLET | Freq: Every day | ORAL | Status: DC
  Administered 2022-12-13 – 2022-12-14 (×2): 81 mg via ORAL
  Filled 2022-12-13 (×2): qty 1

## 2022-12-13 MED ORDER — CAMPHOR-MENTHOL 0.5-0.5 % EX LOTN
TOPICAL_LOTION | CUTANEOUS | Status: DC | PRN
  Administered 2022-12-13: 1 via TOPICAL
  Filled 2022-12-13: qty 222

## 2022-12-13 MED ORDER — ORAL CARE MOUTH RINSE: 15.0000 mL | OROMUCOSAL | Status: DC | PRN

## 2022-12-13 MED ORDER — BISACODYL 10 MG RE SUPP
10.0000 mg | Freq: Once | RECTAL | Status: AC
  Administered 2022-12-13: 10 mg via RECTAL
  Filled 2022-12-13: qty 1

## 2022-12-13 MED ORDER — SACUBITRIL-VALSARTAN 97-103 MG PO TABS
1.0000 | ORAL_TABLET | Freq: Two times a day (BID) | ORAL | Status: DC
  Administered 2022-12-14: 1 via ORAL
  Filled 2022-12-13 (×2): qty 1
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
## 2022-12-13 NOTE — Progress Notes (Addendum)
   Heart Failure Stewardship Pharmacist Progress Note   PCP: Gildardo Pounds, NP PCP-Cardiologist: Peter Martinique, MD    HPI:  82 yo M with PMH of CAD, HFrEF, ICM, HTN, and HLD.   He was seen in the office on 3/26 after an episode of VT was seen on his device, also complaining of chest discomfort. He was set up for elective cardiac cath. Cath on 4/1 revealed 3 vessel obstructive CAD s/p PCI with DES to mid LAD. Planned staged PCI of prox and mid RCA on 4/3. Plan to treat PL and OM branch disease medically. ECHO on 4/2 showed LVEF 30-35%, global hypokinesis, severe LVH, and G1DD. Previous ECHO on 01/2022 also showed LVEF 35%.  4/3: 15 beats of VT this AM. Patient denies dizziness, palpitations, or shortness of breath.   Current HF Medications: Beta Blocker: metoprolol XL 200 mg daily ACE/ARB/ARNI: Entresto 49/51 mg BID  Prior to admission HF Medications: Beta blocker: metoprolol tartrate 100 mg BID ACE/ARB/ARNI: Entresto 97/103 mg BID  Pertinent Lab Values: Serum creatinine 1.24, BUN 16, Potassium 4.5, Sodium 136  Vital Signs: Weight: 142 lbs (admission weight: 146 lbs) Blood pressure: 120/70s  Heart rate: 60s  I/O: incomplete  Medication Assistance / Insurance Benefits Check: Does the patient have prescription insurance?  No  Does the patient qualify for medication assistance through manufacturers or grants?   Yes Eligible grants and/or patient assistance programs: Lisabeth Register, Brilinta, [employer] Medication assistance applications in progress: Dimitri Ped  Medication assistance applications approved: Eustace Moore Approved medication assistance renewals will be completed by: [place]:  Prior to admission outpatient pharmacy: [facility] Is the patient willing to use [place] at discharge? Yes  Assessment: 1. 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 NYHA class II symptoms. - Not volume overloaded on exam, watch off diuretics. Keep  K>4. K 4.5 today and is still receiving 40 mEq daily. Consider stopping with restarting Entresto today. - Continue metoprolol XL 200 mg daily - Agree with restarting Entresto at 49/51 mg BID dose - Did not tolerate spironolactone in the past, states that it made him feel poorly - Consider adding Farxiga 10 mg daily, he is already approved for [employer] to get his inhaler, can attempt for [place] assistance since it's all through the same company   Plan: 1) Medication changes recommended at this time: - Agree with changes - Stop additional K supplementation  2) Patient assistance: - Receives patient assistance for [employer] through [employer]. If updated script needs to be sent, send to [facility] in [place], [place] - Patient assistance for Kary Kos and Wilder Glade started since this is all through [employer]Me (already receives his inhaler from this program)  3)  Education  - Patient has been educated on current HF medications and potential additions to HF medication regimen - Patient verbalizes understanding that over the next few months, these medication doses may change and more medications may be added to optimize HF regimen - Patient has been educated on basic disease state pathophysiology and goals of therapy   Kerby Nora, PharmD, BCPS Heart Failure Stewardship Pharmacist Phone [phone]

## 2022-12-13 NOTE — Progress Notes (Addendum)
Rounding Note    Patient Name: [name] Date of Encounter: 12/13/2022  [place] Cardiologist: Peter Martinique, MD   Subjective   Underwent staged RCA PCI with extensive stents placed in the RCA but unfortunately ended up with no reflow despite aggressive intra-arterial medication regimen.  He has definitely suffered a type IVa MI with elevated troponins and had chest pain intermittently throughout the night.  He is basically completing a small inferior infarct.  There simply just was not much in the way of any outflow of the RCA and the small vessels would likely overwhelm with distal embolization or spasm.  He has been having episodes of chest pain but not necessarily while we were visiting.   Complains of constipation. No bm since prior to hospitalization. Says he normally goes every day. Also complains of taking multiple pills at once thinks they are causing stomach problems for him. Says he normally spaces is meds out several hours at home. He is more preoccupied with getting home to deal with indigestion than staying for heart problems. Reports cramping chest pain that radiates across chest associated with [facility] and dizziness has been coming and going since last night. His pain had initially resolved after the cath. When it returned it was 9/10, intermittent throughout the night. 5/10 prior to discussion this morning, described worsening during discussion with dizziness and shob. Refusing aspirin stating that it causes problems with his stomach as well. He has not ambulated.   Summary of conversation that the resident physician and I had with the patient.  Nursing staff had issues with him taking medications.  Was refusing medications.  Very upset about combining medications.  Was having some chest pain etc. but now seems to be comfortable without real chest pain and his major issue seems to be abdominal issues.  Inpatient Medications    Scheduled Meds:  amiodarone  200 mg  Oral Daily   amLODipine  10 mg Oral Daily   aspirin EC  81 mg Oral Q breakfast   atorvastatin  80 mg Oral Daily   bisacodyl  10 mg Rectal Once   Chlorhexidine Gluconate Cloth  6 each Topical Daily   famotidine  40 mg Oral Daily   lidocaine  1 patch Transdermal Daily   metoprolol succinate  200 mg Oral Daily   mometasone-formoterol  2 puff Inhalation BID   polyethylene glycol  17 g Oral Daily   ranolazine  500 mg Oral BID   sacubitril-valsartan  1 tablet Oral Once   Followed by   Derrill Memo ON 12/14/2022] sacubitril-valsartan  1 tablet Oral BID   ticagrelor  90 mg Oral BID   Continuous Infusions:  sodium chloride     sodium chloride     nitroGLYCERIN 100 mcg/min (12/12/22 1302)   PRN Meds: sodium chloride, sodium chloride, acetaminophen, albuterol, morphine injection, nitroGLYCERIN, ondansetron (ZOFRAN) IV, mouth rinse, sodium chloride flush   Vital Signs    Vitals:   12/13/22 1315 12/13/22 1330 12/13/22 1345 12/13/22 1400  BP:  (!) 166/78  (!) 172/78  Pulse: 60 60 70 63  Resp: 15 14 19 17   Temp:      TempSrc:      SpO2: 97% 96% 95% 93%  Weight:      Height:        Intake/Output Summary (Last 24 hours) at 12/13/2022 1415 Last data filed at 12/13/2022 0000 Gross per 24 hour  Intake 848.08 ml  Output 650 ml  Net 198.08 ml  12/13/2022    5:00 AM 12/12/2022    4:14 AM 12/11/2022    4:11 AM  Last 3 Weights  Weight (lbs) 141 lb 1.5 oz 142 lb 11.2 oz 140 lb  Weight (kg) 64 kg 64.728 kg 63.504 kg      Telemetry    Paced rhythm at 60 with previously seen ST elevations in inferior region - Personally Reviewed  ECG    Paced both atrial and biventricularly. Some inferior ST elevations present, appears largely unchanged compared to previous.  - Personally Reviewed  Physical Exam   GEN: No acute distress.  Resting comfortably in bed Neck: No JVD Cardiac: RRR, no murmurs, rubs, or gallops. Borderline bradycardic, paced at 60bpm. Respiratory: Clear to auscultation  bilaterally. GI: Soft, mildly tender, non-distended-increased bowel sounds. MS: No edema; No deformity. Neuro:  Nonfocal, alert and oriented  Psych: Normal affect   Labs    High Sensitivity Troponin:   Recent Labs  Lab 12/12/22 1534 12/12/22 1906 12/13/22 0932  TROPONINIHS 3,161* 2,106* 8,285*     Chemistry Recent Labs  Lab 12/11/22 0706 12/12/22 0249 12/13/22 0132  NA 137 136 133*  K 4.4 4.2 4.5  CL 106 107 105  CO2 24 21* 21*  GLUCOSE 104* 94 94  BUN 18 17 16   CREATININE 1.43* 1.27* 1.24  CALCIUM 8.4* 8.5* 8.9  GFRNONAA 50* 57* 59*  ANIONGAP 7 8 7     Lipids  Recent Labs  Lab 12/12/22 0249  CHOL 166  TRIG 120  HDL 48  LDLCALC 94  CHOLHDL 3.5    Hematology Recent Labs  Lab 12/11/22 0706 12/13/22 0132  WBC 5.7 6.4  RBC 3.70* 3.85*  HGB 10.6* 11.1*  HCT 33.0* 33.7*  MCV 89.2 87.5  MCH 28.6 28.8  MCHC 32.1 32.9  RDW 14.0 13.7  PLT 164 170   Thyroid No results for input(s): "TSH", "FREET4" in the last 168 hours.  BNPNo results for input(s): "BNP", "PROBNP" in the last 168 hours.  DDimer No results for input(s): "DDIMER" in the last 168 hours.   Radiology    CARDIAC CATHETERIZATION  Result Date: 12/12/2022   Prox RCA lesion is 80% stenosed.   Prox RCA to Mid RCA lesion is 75% stenosed.   Ramus lesion is 40% stenosed.   1st Mrg lesion is 100% stenosed.   RPAV lesion is 100% stenosed.   Non-stenotic Prox LAD to Mid LAD lesion was previously treated.   A drug-eluting stent was successfully placed using a SYNERGY XD 3.50X38.   A drug-eluting stent was successfully placed using a STENT ONYX FRONTIER 3.5X38.   Post intervention, there is a 0% residual stenosis.   Post intervention, there is a 0% residual stenosis. Successful stenting of the proximal and mid RCA with overlapping 3.5 x 38 mm DES but with resultant no reflow due to poor distal vessel runoff Plan: Brilinta monotherapy indefinitely due to ASA intolerance. Will monitor in ICU. Check troponins. IV Ntg  and analgesics for pain.    Cardiac Studies    12/11/22 ECHO: IMPRESSIONS   1. Left ventricular ejection fraction, by estimation, is 30 to 35%. The  left ventricle has moderately decreased function. The left ventricle  demonstrates global hypokinesis. There is severe left ventricular  hypertrophy of the septal segment. Left  ventricular diastolic parameters are consistent with Grade I diastolic  dysfunction (impaired relaxation).   2. Right ventricular systolic function is normal. The right ventricular  size is normal. Moderately increased right ventricular wall thickness.  Tricuspid regurgitation signal is inadequate for assessing PA pressure.   3. Left atrial size was mildly dilated.   4. The mitral valve is abnormal. Mild mitral valve regurgitation. No  evidence of mitral stenosis.   5. The aortic valve is tricuspid. There is mild calcification of the  aortic valve. Aortic valve regurgitation is not visualized. No aortic  stenosis is present. Comparison(s): No significant change from prior study.   12/10/22 Cardiac Cath:   Prox LAD to Mid LAD lesion is 70% stenosed.   Ramus lesion is 40% stenosed.   1st Mrg lesion is 100% stenosed.   Prox RCA lesion is 80% stenosed.   Prox RCA to Mid RCA lesion is 75% stenosed.   RPAV lesion is 100% stenosed.   A drug-eluting stent was successfully placed using a SYNERGY XD 3.50X38.   Post intervention, there is a 0% residual stenosis.   There is mild to moderate left ventricular systolic dysfunction.   LV end diastolic pressure is mildly elevated.   The left ventricular ejection fraction is 45-50% by visual estimate. 3 vessel obstructive CAD     - 75% mid LAD segemental stenosis with RFR 0.84     - 100% CTO of the first OM with left to left collaterals.     - 80% proximal RCA, 75% mid RCA in stent, 100% PLOM with left to right collaterals. 2. Mild to moderate LV dysfunction. EF estimated at 45%. 3. Mildly elevated LVEDP 4. Successful PCI of  the mid LAD with IVUS guidance and DES (3.5 x 38 Synergy post dilated to 4.0)  12/12/22 Cardiac Cath:   Prox RCA lesion is 80% stenosed.   Prox RCA to Mid RCA lesion is 75% stenosed.   Ramus lesion is 40% stenosed.   1st Mrg lesion is 100% stenosed.   RPAV lesion is 100% stenosed.   Non-stenotic Prox LAD to Mid LAD lesion was previously treated.   A drug-eluting stent was successfully placed using a SYNERGY XD 3.50X38.   A drug-eluting stent was successfully placed using a STENT ONYX FRONTIER 3.5X38.   Post intervention, there is a 0% residual stenosis.   Post intervention, there is a 0% residual stenosis. Successful stenting of the proximal and mid RCA with overlapping 3.5 x 38 mm DES but with resultant no reflow due to poor distal vessel runoff  Patient Profile     82 y.o. male with past medical history of CAD, HFrEF, ischemic cardiomyopathy, COPD, hypertension, hyperlipidemia who was previously seen in the office after an episode of VT on his device and placed on amiodarone.  He was seen 3/26 and complained of a cramping sensation in his chest, also continued to have episodes of brief NSVT exertion.  He was set up for outpatient cardiac catheterization as a result.   Assessment & Plan    # Type IV MI # CAD -> PCI of LAD and RCA => Underwent cardiac cath with successful PCI of mid LAD with DES placement. Subsequent staged PCI of proximal/mid RCA performed with successful placement of DES in proximal and mid RCA overlapping, but no reflow due to distal embolization of clot during stenting.  He has complained of anginal symptoms overnight with associated SHOB and dizziness which he believes is related to indigestion. EKG and troponin were checked during episode of reported chest pain. EKG looked similar to prior, however, troponins elevated at 8k. Discussed with Dr. Ellyn Hack who feels that this elevation likely related to embolization of clot during cath yesterday. He  was not responding to  nitrates so we will try Ranexa for his anginal pain. Additionally, will recheck ECHO given ongoing ischemia. It has been difficult to get patient to engage in therapy for heart since he remains preoccupied with "indigestion". In the meantime, we will continue his Brilinta (was refusing ASA so switched from ASA and plavix regimen). He was also taking entreso  97-103 dosage PTA. We will restart this at half dose with plan to increase to full home dose tomorrow if he tolerates.  - continue Brilinta - Ranexa to help with angina - repeat ECHO - Entresto 49-51 today, increase to 97-103 tomorrow. - toprol 200mg  daily - high intensity atorvastatin 80mg  - morphine PRN for pain - continued patient education regarding medications and cardiac rehab necessary  HFrEF - 30-35% ICM Decreased LVEF with global hypokinesis and G1DD. No evidence of volume overload currently on ECHO 4/2. He has had further heart damage that is not amenable to intervention, but would like to repeat ECHO given his ongoing heart damage.  - restart Entresto half dose today and full home dose tomorrow if tolerates - continue torpol 200 - no need for diuretic at thtis time since he has been euvolemic with stable weight.   AKI Related to contrast exposure. Improving.  - restarting entresto today.  - BMP tomorrow  Hx VT Currently on amiodarone taper. In NSR on tele. He is paced.   HTN - toprol 200 - entersto  HLD Atorvastatin 80mg   For questions or updates, please contact [place] Please consult [email or web address] for contact info under        Signed, Delene Ruffini, MD (Internal Medicine Resident) 12/13/2022, 2:15 PM     ATTENDING ATTESTATION  I have seen, examined and evaluated the patient this morning on rounds along with the Resident Physician.  After reviewing all the available data and chart, we discussed the patients laboratory, study & physical findings as well as symptoms in detail.  I agree with his  findings, examination as well as impression recommendations as per our discussion.    Attending adjustments noted in italics.   He looks actually pretty well today despite all of the complaints.  We had a mild walking around after our conversation and he was doing laps in the hallway with no chest pain.  I suspect a lot of his discomfort was from lying in bed and having an upset stomach with no BM.  We will work on his bowel regimen and work on dosing medication so that he is not taking multiple medications at 1 time which is a major concern for him.  He is on lots of medications.  I explained to him the fact that he did have a myocardial infarction as a result of the procedure yesterday.  We will check a limited echo just to reassess any change in wall EF.  However he already has an ICD in place.  \  We are gradually getting him back on his home medications-most notably Delene Loll which is held for renal function issues.  I am hoping that he will stabilize over the course of the day and will be able to potentially consider discharge tomorrow wanted to watch him at least 1 more day because of elevated troponin from Type 4 a MI.    Leonie Man, MD, MS Glenetta Hew, M.D., M.S. Interventional Cardiologist  [place]  Pager # [phone] Phone # [phone] [address]. [place], [place] [address]

## 2022-12-13 NOTE — Progress Notes (Signed)
Heart Failure Nurse Navigator Progress Note  PCP: Gildardo Pounds, NP PCP-Cardiologist: [place] Admission Diagnosis: None Admitted from: Home via EMS  Presentation:   [name] presented for a elective heart cath , which showed 3 vessel obstructive CAD. S/p PCI with DES to mid LAD. 2 stents were placed during a staged PCI on 12/12/22.  Patient was educated on the sign and symptoms of heart failure, daily weights, when to call his doctor or go to the ED.  Diet/ fluid restrictions, patient reported to eating fast food 1-2 times per week ,usually [employer]rend[employer] Patty or [employer]) and states he does drink a Coke occasionally. Patient reported to taking all his medications every day, however he takes them 1 at a time over  course of a few hours. Encourage to attend all medical appointments. Patient verbalized his understanding, a HF TOC appointment was scheduled for 12/28/2022 @ 3 pm.   ECHO/ LVEF: 30-35% HFrEF  Clinical Course:  Past Medical History:  Diagnosis Date   Acute renal failure 05/31/2014   Angina decubitus 06/15/2014   CAD (coronary artery disease)    a. s/p stent RCA DES 9/09; b. 2014 Attempted PCI of OM1 @ [place];  c. 04/2014 Cath: LAD 40-50p, D1 95-99 (chronic), LCX 30-40 inf branch, OM1 CTO, RCA 30-40p, RCA patent stent, EF 35%->Med Rx.   Chronic combined systolic and diastolic CHF (congestive heart failure)    a. EF about 40 to 45% per echo in April 2013;  b. 04/2014 Echo: EF 20-25%, sev LVH, sev glob HK, Gr 1 DD, mildly reduced RV fxn, PASP 62mmHg. c. 01/2017: EF improved to 50-55%.    Chronic low back pain    Complete heart block    COPD (chronic obstructive pulmonary disease) dx 06/2013   PFTs 07/08/13: mod obst with resp to bronchodilator, moderate decrease diffusion, airtrapping   COPD with acute exacerbation 01/17/2017   COPD with asthma    07/08/13 PFT: FEV1 1.74L (66% pred, 30% change with BD), mod obst with resp to bronchodilator, moderate decrease diffusion,  air-trapping 11/2013 Simple spiro>> clear obstruction, FEV1 1.30 L (47% pred) - trial of symbicort 160 2bid 01/26/15      Depression 09/15/2018   Assessment: Increased sadness and depression since losing job  Plan: Patient denies suicidal and homicidal ideations Patient would not like to start medication therapies at this time Will establish patient with community health and wellness for primary care   Dyslipidemia    a. on statin   Essential hypertension    HLD (hyperlipidemia)    HTN (hypertension)    a. Reports intolerance to hydralazine; b. no beta blockers 2/2 bradycardia;  c. failed on ACE and ARB.   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 04/2014 Echo: EF 20-25%, sev glob HK.   Mixed Ischemic/Non-Ischemic Cardiomyopathy    probable mixed ischemic and non-ischemic    Noncompliance    OSA (obstructive sleep apnea) 04/18/2018   06/11/16 - home sleep study shows AHI of 2.9 an hour with the lowest SaO2 of 79% with an average of 93%  03/08/2018-Home sleep study-AHI 7/HR, SaO2 low 81%    Paroxysmal atrial fibrillation    a. identified on device interrogation 01/2016   Pneumonia 09/02/2013   Pulmonary edema 05/10/2014   Respiratory arrest 05/18/2014   Sinus bradycardia 05/31/2014   Ventricular tachycardia  treated with ATP,  CL 250-300 msec     Social History   Socioeconomic History   Marital status: Married    Spouse name: Not on file   Number of children: 3   Years of education: Not on file   Highest education level: Not on file  Occupational History   Occupation: security guard.  Tobacco Use   Smoking status: Former    Packs/day: 0.50    Years: 56.00    Additional pack years: 0.00    Total pack years: 28.00    Types: Cigarettes    Quit date: 09/10/2012    Years since quitting: 10.2   Smokeless tobacco: Never   Tobacco  comments:    tried 1 month ago (05/2018) but didn't like it   Vaping Use   Vaping Use: Never used  Substance and Sexual Activity   Alcohol use: No   Drug use: No   Sexual activity: Not Currently  Other Topics Concern   Not on file  Social History Narrative          Social Determinants of Health   Financial Resource Strain: Not on file  Food Insecurity: Food Insecurity Present (12/10/2022)   Hunger Vital Sign    Worried About Running Out of Food in the Last Year: Often true    Ran Out of Food in the Last Year: Often true  Transportation Needs: No Transportation Needs (12/10/2022)   PRAPARE - Hydrologist (Medical): No    Lack of Transportation (Non-Medical): No  Physical Activity: Not on file  Stress: Not on file  Social Connections: Not on file   Education Assessment and Provision:  Detailed education and instructions provided on heart failure disease management including the following:  Signs and symptoms of Heart Failure When to call the physician Importance of daily weights Low sodium diet Fluid restriction Medication management Anticipated future follow-up appointments  Patient education given on each of the above topics.  Patient acknowledges understanding via teach back method and acceptance of all instructions.  Education Materials:  "Living Better With Heart Failure" Booklet, HF zone tool, & Daily Weight Tracker Tool.  Patient has scale at home: yes Patient has pill box at home: yes    High Risk Criteria for Readmission and/or Poor Patient Outcomes: Heart failure hospital admissions (last 6 months): 0  No Show rate: 9 % Difficult social situation: No Demonstrates medication adherence: Yes,  Primary Language: English Literacy level: Reading, writing, and comprehension  Barriers of Care:   Diet/ fluid restrictions ( fast food 1-2 x per week, some soda)  Daily weights Medication schedule ( takes meds 1 at a time over a course of a  few hours. )   Considerations/Referrals:   Referral made to Heart Failure Pharmacist Stewardship: Yes Referral made to Heart Failure CSW/NCM TOC: No Referral made to Heart & Vascular TOC clinic: Yes, 12/28/2022 @ 3 pm  Items for Follow-up on DC/TOC: Diet/ fluid restrictions ( fast food 1-2 times per week, some soda)  Daily weights Continued HF education including medication. Takes 1 med per hour over a course of several hours )    Earnestine Leys, BSN, Clinical cytogeneticist Only

## 2022-12-13 NOTE — Progress Notes (Signed)
Right radial TR band removed by this RN on 12/12/2022 at 2015. Level 0 with adequate perfusion and no bleeding noted. Gauze and transparent dressing placed on site. Patient educated to continue precautions related to immobility of extremity. Will continue to monitor site per protocol.  Kaynen Minner Franchot Gallo, RN

## 2022-12-13 NOTE — Progress Notes (Signed)
CARDIAC REHAB PHASE I   Assisted pt to bathroom and back to bed. Pt is frustrated that he can't have BM. Pt refused ambulation, even after encouragement. Explained that ambulation was beneficial for bowels, circulation and respiratory function. Post MI/ stent education including heart healthy diet, exercise guidelines, risk factors,  site care and CRP2 reviewed. Pt not receptive to education and continued to contraindicate information given. Will continue to follow and reinforce education.     [id number] Vanessa Barbara, RN BSN 12/13/2022 2:13 PM

## 2022-12-14 ENCOUNTER — Inpatient Hospital Stay (HOSPITAL_COMMUNITY): Payer: Medicare Other

## 2022-12-14 ENCOUNTER — Other Ambulatory Visit (HOSPITAL_COMMUNITY): Payer: Self-pay

## 2022-12-14 ENCOUNTER — Encounter (HOSPITAL_COMMUNITY): Payer: Self-pay

## 2022-12-14 ENCOUNTER — Emergency Department (HOSPITAL_COMMUNITY): Payer: Medicare Other

## 2022-12-14 ENCOUNTER — Other Ambulatory Visit: Payer: Self-pay | Admitting: Cardiology

## 2022-12-14 ENCOUNTER — Inpatient Hospital Stay (HOSPITAL_COMMUNITY)
Admission: EM | Admit: 2022-12-14 | Discharge: 2022-12-18 | DRG: 313 | Disposition: A | Discharge status: Home / Self Care | Payer: Medicare Other | Attending: Cardiology | Admitting: Cardiology

## 2022-12-14 ENCOUNTER — Other Ambulatory Visit: Payer: Self-pay

## 2022-12-14 DIAGNOSIS — Z95810 Presence of automatic (implantable) cardiac defibrillator: Secondary | ICD-10-CM

## 2022-12-14 DIAGNOSIS — Z888 Allergy status to other drugs, medicaments and biological substances status: Secondary | ICD-10-CM

## 2022-12-14 DIAGNOSIS — T508X5A Adverse effect of diagnostic agents, initial encounter: Secondary | ICD-10-CM | POA: Diagnosis not present

## 2022-12-14 DIAGNOSIS — K219 Gastro-esophageal reflux disease without esophagitis: Secondary | ICD-10-CM | POA: Diagnosis present

## 2022-12-14 DIAGNOSIS — M25519 Pain in unspecified shoulder: Secondary | ICD-10-CM | POA: Diagnosis present

## 2022-12-14 DIAGNOSIS — E7800 Pure hypercholesterolemia, unspecified: Secondary | ICD-10-CM | POA: Diagnosis present

## 2022-12-14 DIAGNOSIS — I2510 Atherosclerotic heart disease of native coronary artery without angina pectoris: Secondary | ICD-10-CM | POA: Diagnosis present

## 2022-12-14 DIAGNOSIS — J4489 Other specified chronic obstructive pulmonary disease: Secondary | ICD-10-CM | POA: Diagnosis present

## 2022-12-14 DIAGNOSIS — Z7951 Long term (current) use of inhaled steroids: Secondary | ICD-10-CM

## 2022-12-14 DIAGNOSIS — I2111 ST elevation (STEMI) myocardial infarction involving right coronary artery: Secondary | ICD-10-CM

## 2022-12-14 DIAGNOSIS — Z79899 Other long term (current) drug therapy: Secondary | ICD-10-CM

## 2022-12-14 DIAGNOSIS — Z886 Allergy status to analgesic agent status: Secondary | ICD-10-CM

## 2022-12-14 DIAGNOSIS — R0789 Other chest pain: Secondary | ICD-10-CM | POA: Diagnosis not present

## 2022-12-14 DIAGNOSIS — N179 Acute kidney failure, unspecified: Secondary | ICD-10-CM | POA: Diagnosis present

## 2022-12-14 DIAGNOSIS — G4733 Obstructive sleep apnea (adult) (pediatric): Secondary | ICD-10-CM | POA: Diagnosis present

## 2022-12-14 DIAGNOSIS — I255 Ischemic cardiomyopathy: Secondary | ICD-10-CM | POA: Diagnosis present

## 2022-12-14 DIAGNOSIS — Z9861 Coronary angioplasty status: Secondary | ICD-10-CM

## 2022-12-14 DIAGNOSIS — R7989 Other specified abnormal findings of blood chemistry: Secondary | ICD-10-CM | POA: Diagnosis not present

## 2022-12-14 DIAGNOSIS — I959 Hypotension, unspecified: Secondary | ICD-10-CM | POA: Diagnosis not present

## 2022-12-14 DIAGNOSIS — Z91148 Patient's other noncompliance with medication regimen for other reason: Secondary | ICD-10-CM

## 2022-12-14 DIAGNOSIS — I214 Non-ST elevation (NSTEMI) myocardial infarction: Secondary | ICD-10-CM

## 2022-12-14 DIAGNOSIS — Z87891 Personal history of nicotine dependence: Secondary | ICD-10-CM

## 2022-12-14 DIAGNOSIS — G8929 Other chronic pain: Secondary | ICD-10-CM | POA: Diagnosis present

## 2022-12-14 DIAGNOSIS — I25118 Atherosclerotic heart disease of native coronary artery with other forms of angina pectoris: Secondary | ICD-10-CM

## 2022-12-14 DIAGNOSIS — R072 Precordial pain: Secondary | ICD-10-CM

## 2022-12-14 DIAGNOSIS — Z5986 Financial insecurity: Secondary | ICD-10-CM

## 2022-12-14 DIAGNOSIS — I428 Other cardiomyopathies: Secondary | ICD-10-CM | POA: Diagnosis present

## 2022-12-14 DIAGNOSIS — I110 Hypertensive heart disease with heart failure: Secondary | ICD-10-CM | POA: Diagnosis present

## 2022-12-14 DIAGNOSIS — N1411 Contrast-induced nephropathy: Secondary | ICD-10-CM | POA: Diagnosis not present

## 2022-12-14 DIAGNOSIS — I252 Old myocardial infarction: Secondary | ICD-10-CM

## 2022-12-14 DIAGNOSIS — I442 Atrioventricular block, complete: Secondary | ICD-10-CM | POA: Diagnosis present

## 2022-12-14 DIAGNOSIS — I4729 Other ventricular tachycardia: Secondary | ICD-10-CM

## 2022-12-14 DIAGNOSIS — R109 Unspecified abdominal pain: Secondary | ICD-10-CM | POA: Diagnosis present

## 2022-12-14 DIAGNOSIS — F32A Depression, unspecified: Secondary | ICD-10-CM | POA: Diagnosis present

## 2022-12-14 DIAGNOSIS — E785 Hyperlipidemia, unspecified: Secondary | ICD-10-CM | POA: Diagnosis present

## 2022-12-14 DIAGNOSIS — I5042 Chronic combined systolic (congestive) and diastolic (congestive) heart failure: Secondary | ICD-10-CM | POA: Diagnosis present

## 2022-12-14 DIAGNOSIS — Z7982 Long term (current) use of aspirin: Secondary | ICD-10-CM

## 2022-12-14 DIAGNOSIS — I4819 Other persistent atrial fibrillation: Secondary | ICD-10-CM | POA: Diagnosis present

## 2022-12-14 DIAGNOSIS — M542 Cervicalgia: Secondary | ICD-10-CM | POA: Diagnosis present

## 2022-12-14 DIAGNOSIS — M7918 Myalgia, other site: Secondary | ICD-10-CM | POA: Diagnosis present

## 2022-12-14 DIAGNOSIS — R079 Chest pain, unspecified: Principal | ICD-10-CM | POA: Diagnosis present

## 2022-12-14 DIAGNOSIS — I480 Paroxysmal atrial fibrillation: Secondary | ICD-10-CM

## 2022-12-14 DIAGNOSIS — Z7902 Long term (current) use of antithrombotics/antiplatelets: Secondary | ICD-10-CM

## 2022-12-14 DIAGNOSIS — Z659 Problem related to unspecified psychosocial circumstances: Secondary | ICD-10-CM

## 2022-12-14 LAB — CBC WITH DIFFERENTIAL/PLATELET
Abs Immature Granulocytes: 0.09 10*3/uL — ABNORMAL HIGH (ref 0.00–0.07)
Basophils Absolute: 0 10*3/uL (ref 0.0–0.1)
Basophils Relative: 0 %
Eosinophils Absolute: 0 10*3/uL (ref 0.0–0.5)
Eosinophils Relative: 0 %
HCT: 39 % (ref 39.0–52.0)
Hemoglobin: 12 g/dL — ABNORMAL LOW (ref 13.0–17.0)
Immature Granulocytes: 1 %
Lymphocytes Relative: 6 %
Lymphs Abs: 1.1 10*3/uL (ref 0.7–4.0)
MCH: 28.1 pg (ref 26.0–34.0)
MCHC: 30.8 g/dL (ref 30.0–36.0)
MCV: 91.3 fL (ref 80.0–100.0)
Monocytes Absolute: 1.5 10*3/uL — ABNORMAL HIGH (ref 0.1–1.0)
Monocytes Relative: 8 %
Neutro Abs: 15.2 10*3/uL — ABNORMAL HIGH (ref 1.7–7.7)
Neutrophils Relative %: 85 %
Platelets: 218 10*3/uL (ref 150–400)
RBC: 4.27 MIL/uL (ref 4.22–5.81)
RDW: 14.2 % (ref 11.5–15.5)
WBC: 17.9 10*3/uL — ABNORMAL HIGH (ref 4.0–10.5)
nRBC: 0 % (ref 0.0–0.2)

## 2022-12-14 LAB — COMPREHENSIVE METABOLIC PANEL
ALT: 31 U/L (ref 0–44)
AST: 69 U/L — ABNORMAL HIGH (ref 15–41)
Albumin: 4 g/dL (ref 3.5–5.0)
Alkaline Phosphatase: 88 U/L (ref 38–126)
Anion gap: 15 (ref 5–15)
BUN: 35 mg/dL — ABNORMAL HIGH (ref 8–23)
CO2: 21 mmol/L — ABNORMAL LOW (ref 22–32)
Calcium: 9.2 mg/dL (ref 8.9–10.3)
Chloride: 98 mmol/L (ref 98–111)
Creatinine, Ser: 2.47 mg/dL — ABNORMAL HIGH (ref 0.61–1.24)
GFR, Estimated: 26 mL/min — ABNORMAL LOW (ref >60)
Glucose, Bld: 142 mg/dL — ABNORMAL HIGH (ref 70–99)
Potassium: 5.6 mmol/L — ABNORMAL HIGH (ref 3.5–5.1)
Sodium: 134 mmol/L — ABNORMAL LOW (ref 135–145)
Total Bilirubin: 1.3 mg/dL — ABNORMAL HIGH (ref 0.3–1.2)
Total Protein: 8.4 g/dL — ABNORMAL HIGH (ref 6.5–8.1)

## 2022-12-14 LAB — ECHOCARDIOGRAM LIMITED
Area-P 1/2: 3.85 cm2
Height: 68.5 in
S' Lateral: 3.2 cm
Weight: 2289.26 oz

## 2022-12-14 LAB — BASIC METABOLIC PANEL
Anion gap: 14 (ref 5–15)
BUN: 23 mg/dL (ref 8–23)
CO2: 21 mmol/L — ABNORMAL LOW (ref 22–32)
Calcium: 9.1 mg/dL (ref 8.9–10.3)
Chloride: 98 mmol/L (ref 98–111)
Creatinine, Ser: 1.66 mg/dL — ABNORMAL HIGH (ref 0.61–1.24)
GFR, Estimated: 42 mL/min — ABNORMAL LOW (ref >60)
Glucose, Bld: 119 mg/dL — ABNORMAL HIGH (ref 70–99)
Potassium: 5.1 mmol/L (ref 3.5–5.1)
Sodium: 133 mmol/L — ABNORMAL LOW (ref 135–145)

## 2022-12-14 MED ORDER — ASPIRIN 81 MG PO TBEC
81.0000 mg | DELAYED_RELEASE_TABLET | Freq: Every day | ORAL | 11 refills | Status: DC
  Filled 2022-12-14: qty 30, 30d supply, fill #0
  Filled 2023-02-27: qty 30, 30d supply, fill #1

## 2022-12-14 MED ORDER — TICAGRELOR 90 MG PO TABS
90.0000 mg | ORAL_TABLET | Freq: Two times a day (BID) | ORAL | 5 refills | Status: DC
  Filled 2022-12-14: qty 60, 30d supply, fill #0

## 2022-12-14 MED ORDER — SACUBITRIL-VALSARTAN 97-103 MG PO TABS: 1.0000 | ORAL_TABLET | Freq: Two times a day (BID) | ORAL | 2 refills | Status: DC

## 2022-12-14 MED ORDER — RANOLAZINE ER 500 MG PO TB12
500.0000 mg | ORAL_TABLET | Freq: Two times a day (BID) | ORAL | 2 refills | Status: DC
  Filled 2022-12-14: qty 60, 30d supply, fill #0

## 2022-12-14 MED ORDER — LACTATED RINGERS IV BOLUS
500.0000 mL | Freq: Once | INTRAVENOUS | Status: AC
  Administered 2022-12-14: 500 mL via INTRAVENOUS

## 2022-12-14 MED ORDER — AMIODARONE HCL 200 MG PO TABS
200.0000 mg | ORAL_TABLET | Freq: Every day | ORAL | 2 refills | Status: DC
  Filled 2022-12-14: qty 30, 30d supply, fill #0

## 2022-12-14 MED ORDER — SODIUM CHLORIDE 0.9 % IV SOLN: INTRAVENOUS | Status: DC

## 2022-12-14 MED ORDER — MORPHINE SULFATE (PF) 2 MG/ML IV SOLN
2.0000 mg | Freq: Once | INTRAVENOUS | Status: AC
  Administered 2022-12-14: 2 mg via INTRAVENOUS
  Filled 2022-12-14: qty 1

## 2022-12-14 MED ORDER — METOPROLOL SUCCINATE ER 200 MG PO TB24
200.0000 mg | ORAL_TABLET | Freq: Every day | ORAL | 2 refills | Status: DC
  Filled 2022-12-14: qty 30, 30d supply, fill #0

## 2022-12-14 NOTE — Discharge Summary (Cosign Needed)
Discharge Summary    Patient ID: [name] MRN: [id number]; DOB: 08-Mar-1941  Admit date: 12/10/2022 Discharge date: 12/14/2022  PCP:  Claiborne Rigg, NP   [facility] Providers Cardiologist:  Peter [place], MD  Electrophysiologist:  Regan Lemming, MD       Discharge Diagnoses    Principal Problem:   Unstable angina Active Problems:   Chronic combined systolic and diastolic heart failure   Mixed hyperlipidemia   Chest pain   Status post coronary artery stent placement   Myocardial infarction sequelae    Diagnostic Studies/Procedures    12/11/22 LHC    Prox LAD to Mid LAD lesion is 70% stenosed.   Ramus lesion is 40% stenosed.   1st Mrg lesion is 100% stenosed.   Prox RCA lesion is 80% stenosed.   Prox RCA to Mid RCA lesion is 75% stenosed.   RPAV lesion is 100% stenosed.   A drug-eluting stent was successfully placed using a SYNERGY XD 3.50X38.   Post intervention, there is a 0% residual stenosis.   There is mild to moderate left ventricular systolic dysfunction.   LV end diastolic pressure is mildly elevated.   The left ventricular ejection fraction is 45-50% by visual estimate.   3 vessel obstructive CAD     - 75% mid LAD segemental stenosis with RFR 0.84     - 100% CTO of the first OM with left to left collaterals.     - 80% proximal RCA, 75% mid RCA in stent, 100% PLOM with left to right collaterals. 2. Mild to moderate LV dysfunction. EF estimated at 45%. 3. Mildly elevated LVEDP 4. Successful PCI of the mid LAD with IVUS guidance and DES (3.5 x 38 Synergy post dilated to 4.0)  12/12/22 LHC    Prox RCA lesion is 80% stenosed.   Prox RCA to Mid RCA lesion is 75% stenosed.   Ramus lesion is 40% stenosed.   1st Mrg lesion is 100% stenosed.   RPAV lesion is 100% stenosed.   Non-stenotic Prox LAD to Mid LAD lesion was previously treated.   A drug-eluting stent was successfully placed using a SYNERGY XD 3.50X38.   A drug-eluting stent  was successfully placed using a STENT ONYX FRONTIER 3.5X38.   Post intervention, there is a 0% residual stenosis.   Post intervention, there is a 0% residual stenosis.   Successful stenting of the proximal and mid RCA with overlapping 3.5 x 38 mm DES but with resultant no reflow due to poor distal vessel runoff   Plan: Brilinta monotherapy indefinitely due to ASA intolerance. Will monitor in ICU. Check troponins. IV Ntg and analgesics for pain.   Diagnostic Dominance: Right  Intervention    12/11/22 TTE  IMPRESSIONS     1. Left ventricular ejection fraction, by estimation, is 30 to 35%. The  left ventricle has moderately decreased function. The left ventricle  demonstrates global hypokinesis. There is severe left ventricular  hypertrophy of the septal segment. Left  ventricular diastolic parameters are consistent with Grade I diastolic  dysfunction (impaired relaxation).   2. Right ventricular systolic function is normal. The right ventricular  size is normal. Moderately increased right ventricular wall thickness.  Tricuspid regurgitation signal is inadequate for assessing PA pressure.   3. Left atrial size was mildly dilated.   4. The mitral valve is abnormal. Mild mitral valve regurgitation. No  evidence of mitral stenosis.   5. The aortic valve is tricuspid. There is mild calcification of  the  aortic valve. Aortic valve regurgitation is not visualized. No aortic  stenosis is present.   Comparison(s): No significant change from prior study.   Conclusion(s)/Recommendation(s): Consider outpatient CMR for  chacterization of etiology of LV hypertrophy.   FINDINGS   Left Ventricle: Septal thickness 21 mm on off axis imaging, lateral wall  of 22 mm; there is no LVOT obstruction seen. Left ventricular ejection  fraction, by estimation, is 30 to 35%. The left ventricle has moderately  decreased function. The left  ventricle demonstrates global hypokinesis. The left ventricular  internal  cavity size was normal in size. There is severe left ventricular  hypertrophy of the septal segment. Left ventricular diastolic parameters  are consistent with Grade I diastolic  dysfunction (impaired relaxation).   Right Ventricle: The right ventricular size is normal. Moderately  increased right ventricular wall thickness. Right ventricular systolic  function is normal. Tricuspid regurgitation signal is inadequate for  assessing PA pressure.   Left Atrium: Left atrial size was mildly dilated.   Right Atrium: Right atrial size was normal in size.   Pericardium: There is no evidence of pericardial effusion.   Mitral Valve: The mitral valve is abnormal. Mild mitral valve  regurgitation. No evidence of mitral valve stenosis.   Tricuspid Valve: The tricuspid valve is normal in structure. Tricuspid  valve regurgitation is not demonstrated. No evidence of tricuspid  stenosis.   Aortic Valve: The aortic valve is tricuspid. There is mild calcification  of the aortic valve. There is mild aortic valve annular calcification.  Aortic valve regurgitation is not visualized. No aortic stenosis is  present. Aortic valve peak gradient  measures 3.5 mmHg.   Pulmonic Valve: The pulmonic valve was normal in structure. Pulmonic valve  regurgitation is not visualized. No evidence of pulmonic stenosis.   Aorta: The aortic root and ascending aorta are structurally normal, with  no evidence of dilitation.   IAS/Shunts: No atrial level shunt detected by color flow Doppler.   Additional Comments: A device lead is visualized in the right ventricle  and right atrium.   12/14/22 Limited TTE  IMPRESSIONS     1. Left ventricular ejection fraction, by estimation, is 50 to 55%. The  left ventricle has low normal function. The left ventricle has no regional  wall motion abnormalities. There is moderate concentric left ventricular  hypertrophy. Left ventricular  diastolic parameters are  consistent with Grade I diastolic dysfunction  (impaired relaxation). There is dyskinesis of the left ventricular, basal  inferior wall. There is akinesis of the left ventricular, basal-mid  inferolateral wall.   2. Right ventricular systolic function is normal. The right ventricular  size is normal. Tricuspid regurgitation signal is inadequate for assessing  PA pressure.   3. The mitral valve is normal in structure. Mild mitral valve  regurgitation. No evidence of mitral stenosis.   4. The aortic valve is tricuspid. Aortic valve regurgitation is not  visualized. Aortic valve sclerosis/calcification is present, without any  evidence of aortic stenosis.   5. The inferior vena cava is normal in size with greater than 50%  respiratory variability, suggesting right atrial pressure of 3 mmHg.   FINDINGS   Left Ventricle: Left ventricular ejection fraction, by estimation, is 50  to 55%. The left ventricle has low normal function. The left ventricle has  no regional wall motion abnormalities. The left ventricular internal  cavity size was normal in size.  There is moderate concentric left ventricular hypertrophy. Left  ventricular diastolic parameters are consistent  with Grade I diastolic  dysfunction (impaired relaxation). Normal left ventricular filling  pressure.   Right Ventricle: The right ventricular size is normal. No increase in  right ventricular wall thickness. Right ventricular systolic function is  normal. Tricuspid regurgitation signal is inadequate for assessing PA  pressure.   Left Atrium: Left atrial size was normal in size.   Right Atrium: Right atrial size was normal in size.   Pericardium: There is no evidence of pericardial effusion.   Mitral Valve: The mitral valve is normal in structure. Mild mitral valve  regurgitation. No evidence of mitral valve stenosis.   Tricuspid Valve: The tricuspid valve is normal in structure. Tricuspid  valve regurgitation is not  demonstrated. No evidence of tricuspid  stenosis.   Aortic Valve: The aortic valve is tricuspid. Aortic valve regurgitation is  not visualized. Aortic valve sclerosis/calcification is present, without  any evidence of aortic stenosis.   Pulmonic Valve: The pulmonic valve was normal in structure. Pulmonic valve  regurgitation is not visualized. No evidence of pulmonic stenosis.   Aorta: The aortic root is normal in size and structure.   Venous: The inferior vena cava is normal in size with greater than 50%  respiratory variability, suggesting right atrial pressure of 3 mmHg.   IAS/Shunts: No atrial level shunt detected by color flow Doppler.   Additional Comments: A device lead is visualized.  _____________   History of Present Illness     [name] is a 82 y.o. male with PMH notable for CAD, HFrEF, ICM, COPD, former tobacco use, HTN, HLD, SCAF. Patient saw Dr. Elberta Fortis 08/2022 after a VT episode treated with ATP. Patient was asymptomatic. Labs stable, no med changes, rec AAD if VT continued. Device clinic alerted in March for 4 VF episodes, all converted with ATP x 1, and >100 NSVT episodes. It was recommended at that time for patient to begin amiodarone via telephone notes. Patient again seen on 3/26 by Dr. Elberta Fortis for increased VT requiring ATP therapy. Since December, patient also has had increased cramping in central chest as well as worsening exertional dyspnea and dizziness/pre-syncope. Nitroglycerin does relieve these symptoms. Chest discomfort and dyspnea do not necessarily occur simultaneously. Given these symptoms, decision made to pursue outpatient LHC.   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 Subsequent staged PCI of proximal/mid RCA performed with successful placement of DES in proximal and mid RCA overlapping, but no reflow due to distal  embolization of clot during stenting. Following cath/PCI, patienthad anginal symptoms which he believed were indigestion. Repeat ECG similar to previous though troponin elevated to 8285. Per Dr. Herbie Baltimore, elevation felt related to embolization of clot during cath. Patient placed on Ranexa for angina. During this admission, medication compliance was an ongoing issue. Patient initially refused ASA and was switched to Brilinta monotherapy. On the night prior to discharge, patient declined multiple medications including Entresto, Ranexa, and Brilinta though he did agree to take this morning after discussion with Dr. Herbie Baltimore. Patient has been thoroughly educated on the importance of compliance with Brilinta and ASA and the significant risk of stent occlusion if not taken. At time of discharge, patient is resting comfortably in bed, in no apparent distress/discomfort.  Continue Brilinta/ASA at discharge Toprol XL 200mg  daily Ranexa 500mg  BID Atorvastatin 80mg  PRN nitroglycerin Close cardiology follow up in 1 week with repeat BMP  prior to follow up. This plan was reviewed carefully with patient.  HFrEF Ischemic cardiomyopathy  Patient with LVEF 30-35%, global hypokinesis, grade I diastolic dysfunction. Repeat limited echo obtained on day of discharge given further cardiac ischemia not amenable to intervention.  Repeat limited echocardiogram on the day of discharge showed LVEF 50 to 55% no regional wall motion abnormalities.  IVC with normal inspiratory variability, no evidence of volume overload.  Given AKI, will hold Entresto until repeat BMP/outpatient cardiology follow-up Toprol XL 200mg  daily Given medication compliance concerns, will not add further GDMT at this time.  Hypertension  BP near goal. Continue Toprol XL 200mg , Amlodipine 10mg . Entresto held with elevated creatinine/K 5.1.  AKI  Patient with elevated creatinine following LHC/PCI. Improved yesterday but elevated again today. Given  this, will hold Entresto until he has repeat BMP and outpatient follow-up.  Hx VT  Patient's Amiodarone taper now down to 200mg  daily. Continue with this dose daily.     Did the patient have an acute coronary syndrome (MI, NSTEMI, STEMI, etc) this admission?:  Yes                               AHA/ACC Clinical Performance & Quality Measures: Aspirin prescribed? - Yes ADP Receptor Inhibitor (Plavix/Clopidogrel, Brilinta/Ticagrelor or Effient/Prasugrel) prescribed (includes medically managed patients)? - Yes Beta Blocker prescribed? - Yes High Intensity Statin (Lipitor 40-80mg  or Crestor 20-40mg ) prescribed? - Yes EF assessed during THIS hospitalization? - Yes For EF <40%, was ACEI/ARB prescribed? - Yes For EF <40%, Aldosterone Antagonist (Spironolactone or Eplerenone) prescribed? - No - Reason:  hx intolerance Cardiac Rehab Phase II ordered (including medically managed patients)? - Yes         _____________  Discharge Vitals Blood pressure (!) 144/72, pulse 60, temperature 97.8 F (36.6 C), resp. rate 15, height 5' 8.5" (1.74 m), weight 64.9 kg, SpO2 99 %.  Filed Weights   12/12/22 0414 12/13/22 0500 12/14/22 0500  Weight: 64.7 kg 64 kg 64.9 kg    Labs & Radiologic Studies    CBC Recent Labs    12/13/22 0132  WBC 6.4  HGB 11.1*  HCT 33.7*  MCV 87.5  PLT 170   Basic Metabolic Panel Recent Labs    13/08/65 0132 12/14/22 1014  NA 133* 133*  K 4.5 5.1  CL 105 98  CO2 21* 21*  GLUCOSE 94 119*  BUN 16 23  CREATININE 1.24 1.66*  CALCIUM 8.9 9.1   Liver Function Tests No results for input(s): "AST", "ALT", "ALKPHOS", "BILITOT", "PROT", "ALBUMIN" in the last 72 hours. No results for input(s): "LIPASE", "AMYLASE" in the last 72 hours. High Sensitivity Troponin:   Recent Labs  Lab 12/12/22 1534 12/12/22 1906 12/13/22 0932  TROPONINIHS 3,161* 2,106* 8,285*    BNP Invalid input(s): "POCBNP" D-Dimer No results for input(s): "DDIMER" in the last 72  hours. Hemoglobin A1C Recent Labs    12/12/22 0249  HGBA1C 6.1*   Fasting Lipid Panel Recent Labs    12/12/22 0249  CHOL 166  HDL 48  LDLCALC 94  TRIG 120  CHOLHDL 3.5   Thyroid Function Tests No results for input(s): "TSH", "T4TOTAL", "T3FREE", "THYROIDAB" in the last 72 hours.  Invalid input(s): "FREET3" _____________  ECHOCARDIOGRAM LIMITED  Result Date: 12/14/2022    ECHOCARDIOGRAM LIMITED REPORT   Patient Name:   [name] [facility] Date of Exam: 12/14/2022 Medical Rec #:  [id number]      Height:  68.5 in Accession #:    [id number]     Weight:       143.1 lb Date of Birth:  Dec 04, 1940     BSA:          1.782 m Patient Age:    81 years       BP:           135/62 mmHg Patient Gender: M              HR:           60 bpm. Exam Location:  Inpatient Procedure: Limited Echo and Limited Color Doppler Indications:    Acute myocardial infarction, unspecified I21.9  History:        Patient has prior history of Echocardiogram examinations, most                 recent 12/11/2022. CHF and Cardiomyopathy, Angina and CAD, COPD,                 Arrythmias:LBBB and Bradycardia, Signs/Symptoms:Chest Pain,                 Dyspnea and Syncope; Risk Factors:Hypertension, Sleep Apnea,                 Dyslipidemia and Current Smoker.  Sonographer:    Lucendia Herrlich Referring Phys: [phone] PETER M [place] IMPRESSIONS  1. Left ventricular ejection fraction, by estimation, is 50 to 55%. The left ventricle has low normal function. The left ventricle has no regional wall motion abnormalities. There is moderate concentric left ventricular hypertrophy. Left ventricular diastolic parameters are consistent with Grade I diastolic dysfunction (impaired relaxation). There is dyskinesis of the left ventricular, basal inferior wall. There is akinesis of the left ventricular, basal-mid inferolateral wall.  2. Right ventricular systolic function is normal. The right ventricular size is normal. Tricuspid regurgitation signal is  inadequate for assessing PA pressure.  3. The mitral valve is normal in structure. Mild mitral valve regurgitation. No evidence of mitral stenosis.  4. The aortic valve is tricuspid. Aortic valve regurgitation is not visualized. Aortic valve sclerosis/calcification is present, without any evidence of aortic stenosis.  5. The inferior vena cava is normal in size with greater than 50% respiratory variability, suggesting right atrial pressure of 3 mmHg. FINDINGS  Left Ventricle: Left ventricular ejection fraction, by estimation, is 50 to 55%. The left ventricle has low normal function. The left ventricle has no regional wall motion abnormalities. The left ventricular internal cavity size was normal in size. There is moderate concentric left ventricular hypertrophy. Left ventricular diastolic parameters are consistent with Grade I diastolic dysfunction (impaired relaxation). Normal left ventricular filling pressure. Right Ventricle: The right ventricular size is normal. No increase in right ventricular wall thickness. Right ventricular systolic function is normal. Tricuspid regurgitation signal is inadequate for assessing PA pressure. Left Atrium: Left atrial size was normal in size. Right Atrium: Right atrial size was normal in size. Pericardium: There is no evidence of pericardial effusion. Mitral Valve: The mitral valve is normal in structure. Mild mitral valve regurgitation. No evidence of mitral valve stenosis. Tricuspid Valve: The tricuspid valve is normal in structure. Tricuspid valve regurgitation is not demonstrated. No evidence of tricuspid stenosis. Aortic Valve: The aortic valve is tricuspid. Aortic valve regurgitation is not visualized. Aortic valve sclerosis/calcification is present, without any evidence of aortic stenosis. Pulmonic Valve: The pulmonic valve was normal in structure. Pulmonic valve regurgitation is not visualized. No evidence of pulmonic stenosis. Aorta: The  aortic root is normal in size  and structure. Venous: The inferior vena cava is normal in size with greater than 50% respiratory variability, suggesting right atrial pressure of 3 mmHg. IAS/Shunts: No atrial level shunt detected by color flow Doppler. Additional Comments: A device lead is visualized.  LEFT VENTRICLE PLAX 2D LVIDd:         4.00 cm   Diastology LVIDs:         3.20 cm   LV e' medial:    4.50 cm/s LV PW:         1.60 cm   LV E/e' medial:  14.4 LV IVS:        1.47 cm   LV e' lateral:   5.48 cm/s LVOT diam:     2.40 cm   LV E/e' lateral: 11.8 LVOT Area:     4.52 cm  LEFT ATRIUM         Index LA diam:    2.90 cm 1.63 cm/m   AORTA Ao Root diam: 3.30 cm Ao Asc diam:  3.20 cm MITRAL VALVE MV Area (PHT): 3.85 cm    SHUNTS MV Decel Time: 197 msec    Systemic Diam: 2.40 cm MV E velocity: 64.90 cm/s MV A velocity: 98.80 cm/s MV E/A ratio:  0.66 Armanda Magic MD Electronically signed by Armanda Magic MD Signature Date/Time: 12/14/2022/3:52:15 PM    Final    DG CHEST PORT 1 VIEW  Result Date: 12/13/2022 CLINICAL DATA:  [id number] with chest pain superiorly. EXAM: PORTABLE CHEST 1 VIEW COMPARISON:  CT chest, abdomen and pelvis recently 12/06/2022 FINDINGS: 8:09 p.m. A left chest dual lead pacing system with AID wire is again shown with stable wire insertions. There is mild cardiomegaly. Normal caliber vascular markings. The lungs are mildly emphysematous but clear. There is mild aortic tortuosity with patchy calcific plaques and a stable mediastinal configuration. No pleural effusion is seen or pneumothorax. Regional osseous structures grossly intact. IMPRESSION: No evidence of acute chest disease. Stable COPD chest with cardiomegaly. Aortic atherosclerosis. Electronically Signed   By: Almira Bar M.D.   On: 12/13/2022 20:40   CARDIAC CATHETERIZATION  Result Date: 12/12/2022   Prox RCA lesion is 80% stenosed.   Prox RCA to Mid RCA lesion is 75% stenosed.   Ramus lesion is 40% stenosed.   1st Mrg lesion is 100% stenosed.   RPAV lesion is 100%  stenosed.   Non-stenotic Prox LAD to Mid LAD lesion was previously treated.   A drug-eluting stent was successfully placed using a SYNERGY XD 3.50X38.   A drug-eluting stent was successfully placed using a STENT ONYX FRONTIER 3.5X38.   Post intervention, there is a 0% residual stenosis.   Post intervention, there is a 0% residual stenosis. Successful stenting of the proximal and mid RCA with overlapping 3.5 x 38 mm DES but with resultant no reflow due to poor distal vessel runoff Plan: Brilinta monotherapy indefinitely due to ASA intolerance. Will monitor in ICU. Check troponins. IV Ntg and analgesics for pain.   ECHOCARDIOGRAM COMPLETE  Result Date: 12/11/2022    ECHOCARDIOGRAM REPORT   Patient Name:   [name] [facility] Date of Exam: 12/11/2022 Medical Rec #:  [id number]      Height:       68.5 in Accession #:    [id number]     Weight:       140.0 lb Date of Birth:  July 17, 1941     BSA:  1.766 m Patient Age:    68 years       BP:           121/64 mmHg Patient Gender: M              HR:           64 bpm. Exam Location:  Inpatient Procedure: 2D Echo, Color Doppler and Cardiac Doppler Indications:    Ischemic Cardiomyopathy  History:        Patient has prior history of Echocardiogram examinations, most                 recent 01/15/2022. CAD, Defibrillator, COPD, Arrythmias:LBBB and                 Bradycardia; Risk Factors:Hypertension, Dyslipidemia, Sleep                 Apnea and Former Smoker.  Sonographer:    Delcie Roch RDCS Referring Phys: [phone] PETER M [place] IMPRESSIONS  1. Left ventricular ejection fraction, by estimation, is 30 to 35%. The left ventricle has moderately decreased function. The left ventricle demonstrates global hypokinesis. There is severe left ventricular hypertrophy of the septal segment. Left ventricular diastolic parameters are consistent with Grade I diastolic dysfunction (impaired relaxation).  2. Right ventricular systolic function is normal. The right ventricular size is  normal. Moderately increased right ventricular wall thickness. Tricuspid regurgitation signal is inadequate for assessing PA pressure.  3. Left atrial size was mildly dilated.  4. The mitral valve is abnormal. Mild mitral valve regurgitation. No evidence of mitral stenosis.  5. The aortic valve is tricuspid. There is mild calcification of the aortic valve. Aortic valve regurgitation is not visualized. No aortic stenosis is present. Comparison(s): No significant change from prior study. Conclusion(s)/Recommendation(s): Consider outpatient CMR for chacterization of etiology of LV hypertrophy. FINDINGS  Left Ventricle: Septal thickness 21 mm on off axis imaging, lateral wall of 22 mm; there is no LVOT obstruction seen. Left ventricular ejection fraction, by estimation, is 30 to 35%. The left ventricle has moderately decreased function. The left ventricle demonstrates global hypokinesis. The left ventricular internal cavity size was normal in size. There is severe left ventricular hypertrophy of the septal segment. Left ventricular diastolic parameters are consistent with Grade I diastolic dysfunction (impaired relaxation). Right Ventricle: The right ventricular size is normal. Moderately increased right ventricular wall thickness. Right ventricular systolic function is normal. Tricuspid regurgitation signal is inadequate for assessing PA pressure. Left Atrium: Left atrial size was mildly dilated. Right Atrium: Right atrial size was normal in size. Pericardium: There is no evidence of pericardial effusion. Mitral Valve: The mitral valve is abnormal. Mild mitral valve regurgitation. No evidence of mitral valve stenosis. Tricuspid Valve: The tricuspid valve is normal in structure. Tricuspid valve regurgitation is not demonstrated. No evidence of tricuspid stenosis. Aortic Valve: The aortic valve is tricuspid. There is mild calcification of the aortic valve. There is mild aortic valve annular calcification. Aortic valve  regurgitation is not visualized. No aortic stenosis is present. Aortic valve peak gradient measures 3.5 mmHg. Pulmonic Valve: The pulmonic valve was normal in structure. Pulmonic valve regurgitation is not visualized. No evidence of pulmonic stenosis. Aorta: The aortic root and ascending aorta are structurally normal, with no evidence of dilitation. IAS/Shunts: No atrial level shunt detected by color flow Doppler. Additional Comments: A device lead is visualized in the right ventricle and right atrium.  LEFT VENTRICLE PLAX 2D LVIDd:  4.20 cm     Diastology LVIDs:         3.00 cm     LV e' medial:    3.09 cm/s LV PW:         1.20 cm     LV E/e' medial:  13.3 LV IVS:        1.50 cm     LV e' lateral:   3.09 cm/s LVOT diam:     2.40 cm     LV E/e' lateral: 13.3 LV SV:         90 LV SV Index:   51 LVOT Area:     4.52 cm  LV Volumes (MOD) LV vol d, MOD A4C: 74.8 ml LV vol s, MOD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

## 2022-12-14 NOTE — Progress Notes (Signed)
   Heart Failure Stewardship Pharmacist Progress Note   PCP: Claiborne Rigg, NP PCP-Cardiologist: Peter Swaziland, MD    HPI:  82 yo M with PMH of CAD, HFrEF, ICM, HTN, and HLD.   He was seen in the office on 3/26 after an episode of VT was seen on his device, also complaining of chest discomfort. He was set up for elective cardiac cath. Cath on 4/1 revealed 3 vessel obstructive CAD s/p PCI with DES to mid LAD. Planned staged PCI of prox and mid RCA on 4/3. Plan to treat PL and OM branch disease medically. ECHO on 4/2 showed LVEF 30-35%, global hypokinesis, severe LVH, and G1DD. Previous ECHO on 01/2022 also showed LVEF 35%. Taken back to cath lab on 4/3 and successful stenting of prox and mid RCA with DES but with resultant no reflow due to poor distal vessel runoff. Repeat ECHO pending.   Current HF Medications: Beta Blocker: metoprolol XL 200 mg daily ACE/ARB/ARNI: Entresto 97/103 mg BID  Prior to admission HF Medications: Beta blocker: metoprolol tartrate 100 mg BID ACE/ARB/ARNI: Entresto 97/103 mg BID  Pertinent Lab Values: Serum creatinine 1.24, BUN 16, Potassium 4.5, Sodium 136  Vital Signs: Weight: 142 lbs (admission weight: 146 lbs) Blood pressure: 140/80s Heart rate: 60s  I/O: incomplete  Medication Assistance / Insurance Benefits Check: Does the patient have prescription insurance?  No  Does the patient qualify for medication assistance through manufacturers or grants?   Yes Eligible grants and/or patient assistance programs: Clifton Custard, Bril[employer], Bevespi Medication assistance applications in progress: Ena Dawley  Medication assistance applications approved: Edwin Cap Approved medication assistance renewals will be completed by: [facility]  Outpatient Pharmacy:  Prior to admission outpatient pharmacy: [facility] Is the patient willing to use [facility] pharmacy at discharge? Yes  Assessment: 1. 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 NYHA class  II symptoms. - Not volume overloaded on exam, watch off diuretics. Keep K>4. AM labs pending.  - Continue metoprolol XL 200 mg daily - Agree with increasing back to Entresto 97/103 mg BID - Did not tolerate spironolactone in the past, states that it made him feel poorly - Consider adding Farxiga 10 mg daily, he is already approved for [employer] to get his inhaler, can attempt for Farxiga assistance since it's all through the same company   Plan: 1) Medication changes recommended at this time: - Agree with changes - Add Farxiga 10 mg daily if creatinine ok  2) Patient assistance: - Receives patient assistance for [employer] through [employer]. If updated script needs to be sent, send to [facility] in [place], [place] - Patient assistance for Marden Noble and [name] started since this is all through [employer] (already receives his inhaler from this program)  3)  Education  - Patient has been educated on current HF medications and potential additions to HF medication regimen - Patient verbalizes understanding that over the next few months, these medication doses may change and more medications may be added to optimize HF regimen - Patient has been educated on basic disease state pathophysiology and goals of therapy   Sharen Hones, PharmD, BCPS Heart Failure Stewardship Pharmacist Phone [phone]

## 2022-12-14 NOTE — Progress Notes (Signed)
CARDIAC REHAB PHASE I   Pt eating lunch, will return to offer walk later today, as time permits.    Woodroe Chen, RN BSN 12/14/2022 [phone]

## 2022-12-14 NOTE — Progress Notes (Signed)
D/C instructions discussed and all questions answered at this time. Hospital equipment & IV line removed. Medications delivered to bedside by outpatient pharmacy and all belongings gathered and returned to pt at this time. No other needs identified. Pt transported to front of hospital via wheelchair by RN.

## 2022-12-14 NOTE — Care Management (Signed)
  Transition of Care [facility]) Screening Note   Patient Details  Name: [name] Date of Birth: Jun 08, 1941   Transition of Care [facility]) CM/SW Contact:    Gala Lewandowsky, RN Phone Number: 12/14/2022, 4:55 PM    Transition of Care Department [facility]) has reviewed the patient. Patient is without insurance and has PCP at the [facility] and [employer]. MATCH completed for this patient. Medications will be delivered from [facility]. No further needs identified at this time.

## 2022-12-14 NOTE — ED Provider Notes (Addendum)
EMERGENCY DEPARTMENT AT [facility] Provider Note   CSN: [id number] Arrival date & time: 12/14/22  2204     History  Chief Complaint  Patient presents with   Chest Pain    Released today from inpt after stent placement midsternal CP onset 2130 with sob arrived via ems    [name] is a 82 y.o. male with PMH HFrEF, ICM, COPD, smoking history, HTN, HLD, CAD now status post recent stent to the mid LAD and RCA who was discharged from the cardiology service earlier today and is presenting to the ED with anginal chest pain.  He says that he went home earlier this afternoon and was initially feeling okay but then he had his dinner and then started having chest pain once again.  He states that he took all of his prescribed medicines including his aspirin, Brilinta, Ranexa.  The pain is substernal radiating to both sides and he has not had any relief with his medicines.  It is not pleuritic in nature.  He gets worse when he exerts himself.  He also states that he has had multiple episodes of "passing out".  He has not had any productive cough, nausea vomiting diarrhea, fevers or chills.  Upon chart review, during his most recent admission, it seems like he declined to take his medicines on a couple occasions.  Home Medications Prior to Admission medications   Medication Sig Start Date End Date Taking? Authorizing Provider  acetaminophen (TYLENOL) 650 MG CR tablet Take 650 mg by mouth every 8 (eight) hours as needed for pain.    [provider]  albuterol (PROVENTIL) (2.5 MG/3ML) 0.083% nebulizer solution Take 3 mLs (2.5 mg total) by nebulization every 6 (six) hours as needed for wheezing or shortness of breath. 07/11/22   Claiborne Rigg, NP  albuterol (VENTOLIN HFA) 108 (90 Base) MCG/ACT inhaler Inhale 1-2 puffs into the lungs every 6 (six) hours as needed for wheezing or shortness of breath. 11/13/21   Claiborne Rigg, NP  amiodarone (PACERONE) 200 MG tablet Take 1  tablet (200 mg total) by mouth daily. 12/15/22   Perlie Gold, PA-C  amLODipine (NORVASC) 10 MG tablet Take 1 tablet (10 mg total) by mouth daily. 10/15/22   Claiborne Rigg, NP  aspirin EC 81 MG tablet Take 1 tablet (81 mg total) by mouth daily. Swallow whole. 12/15/22   Perlie Gold, PA-C  atorvastatin (LIPITOR) 80 MG tablet Take 1 tablet (80 mg total) by mouth once daily. 02/20/22   [place], Peter M, MD  dextromethorphan-guaiFENesin (TUSSIN DM) 10-100 MG/5ML liquid Take 10 mLs by mouth every 6 (six) hours as needed for cough. 07/11/22   Claiborne Rigg, NP  famotidine (PEPCID) 40 MG tablet Take 1 tablet (40 mg total) by mouth daily. 11/05/22   Claiborne Rigg, NP  fluticasone (FLONASE) 50 MCG/ACT nasal spray Place 2 sprays into both nostrils daily. Patient not taking: Reported on 12/04/2022 07/11/22   Claiborne Rigg, NP  Glycopyrrolate-Formoterol (BEVESPI AEROSPHERE) 9-4.8 MCG/ACT AERO Inhale 2 puffs into the lungs 2 (two) times daily. NEEDS PASS 07/12/22   Claiborne Rigg, NP  ipratropium-albuterol (DUONEB) 0.5-2.5 (3) MG/3ML SOLN Take 3 mLs by nebulization every 6 (six) hours as needed (wheezing or shortness of breath). 09/14/22   Zenia Resides, MD  metoprolol (TOPROL-XL) 200 MG 24 hr tablet Take 1 tablet (200 mg total) by mouth daily. Take with or immediately following a meal. 12/15/22 03/15/23  Perlie Gold, PA-C  mometasone-formoterol (DULERA) 200-5 MCG/ACT AERO Inhale 2 puffs into the lungs 2 (two) times daily. 07/11/22   Claiborne RiggFleming, Zelda W, NP  nitroGLYCERIN (NITROSTAT) 0.4 MG SL tablet Place 1 tablet (0.4 mg total) under the tongue every 5 (five) minutes as needed for chest pain. 12/08/21 04/27/24  Hoy RegisterNewlin, Enobong, MD  ranolazine (RANEXA) 500 MG 12 hr tablet Take 1 tablet (500 mg total) by mouth 2 (two) times daily. 12/14/22   Perlie GoldWilliams, Evan, PA-C  sacubitril-valsartan (ENTRESTO) 97-103 MG Take 1 tablet by mouth 2 (two) times daily. 12/14/22 03/14/23  Perlie GoldWilliams, Evan, PA-C  ticagrelor (BRILINTA) 90 MG  TABS tablet Take 1 tablet (90 mg total) by mouth 2 (two) times daily. 12/14/22 06/12/23  Perlie GoldWilliams, Evan, PA-C      Allergies    Acyclovir and related and Aspirin    Review of Systems   See HPI  Physical Exam Updated Vital Signs BP (!) 132/58   Pulse (!) 59   Temp 97.8 F (36.6 C) (Oral)   Resp 18   Ht 5\' 8"  (1.727 m)   Wt 65 kg   SpO2 95%   BMI 21.79 kg/m  Physical Exam Constitutional:      General: He is in acute distress.  HENT:     Head: Normocephalic and atraumatic.  Eyes:     Extraocular Movements: Extraocular movements intact.     Pupils: Pupils are equal, round, and reactive to light.  Neck:     Vascular: No JVD.  Cardiovascular:     Rate and Rhythm: Normal rate and regular rhythm.     Heart sounds: Normal heart sounds. No murmur heard.    No friction rub. No gallop.  Pulmonary:     Effort: Tachypnea present.     Breath sounds: No decreased breath sounds, wheezing, rhonchi or rales.  Chest:     Chest wall: No tenderness.     Comments: Cardiac device in place Musculoskeletal:     Right lower leg: No edema.     Left lower leg: No edema.  Skin:    General: Skin is warm and dry.  Neurological:     Mental Status: He is alert.     ED Results / Procedures / Treatments   Labs (all labs ordered are listed, but only abnormal results are displayed) Labs Reviewed  CBC WITH DIFFERENTIAL/PLATELET - Abnormal; Notable for the following components:      Result Value   WBC 17.9 (*)    Hemoglobin 12.0 (*)    Neutro Abs 15.2 (*)    Monocytes Absolute 1.5 (*)    Abs Immature Granulocytes 0.09 (*)    All other components within normal limits  COMPREHENSIVE METABOLIC PANEL - Abnormal; Notable for the following components:   Sodium 134 (*)    Potassium 5.6 (*)    CO2 21 (*)    Glucose, Bld 142 (*)    BUN 35 (*)    Creatinine, Ser 2.47 (*)    Total Protein 8.4 (*)    AST 69 (*)    Total Bilirubin 1.3 (*)    GFR, Estimated 26 (*)    All other components within  normal limits  TROPONIN I (HIGH SENSITIVITY) - Abnormal; Notable for the following components:   Troponin I (High Sensitivity) 9,381 (*)    All other components within normal limits  BRAIN NATRIURETIC PEPTIDE    EKG EKG Interpretation  Date/Time:  Friday December 14 2022 22:07:22 EDT Ventricular Rate:  63 PR Interval:  180 QRS Duration:  139 QT Interval:  422 QTC Calculation: 432 R Axis:   263 Text Interpretation: Atrial-ventricular dual-paced rhythm No further analysis attempted due to paced rhythm when compared to prior, similar appearance. No STEMI Confirmed by Theda Belfast ([id number]) on 12/14/2022 10:17:05 PM  Radiology DG CHEST PORT 1 VIEW  Result Date: 12/14/2022 CLINICAL DATA:  Dyspnea EXAM: PORTABLE CHEST 1 VIEW COMPARISON:  12/13/2022 FINDINGS: Multi lead left-sided pacing device. Cardiomegaly with aortic atherosclerosis. No acute airspace disease, pleural effusion or pneumothorax. IMPRESSION: No active disease. Cardiomegaly. Electronically Signed   By: Jasmine Pang M.D.   On: 12/14/2022 23:14   ECHOCARDIOGRAM LIMITED  Result Date: 12/14/2022    ECHOCARDIOGRAM LIMITED REPORT   Patient Name:   [name] [facility] Date of Exam: 12/14/2022 Medical Rec #:  [id number]      Height:       68.5 in Accession #:    [id number]     Weight:       143.1 lb Date of Birth:  [birth year]     BSA:          1.782 m Patient Age:    81 years       BP:           135/62 mmHg Patient Gender: M              HR:           60 bpm. Exam Location:  Inpatient Procedure: Limited Echo and Limited Color Doppler Indications:    Acute myocardial infarction, unspecified I21.9  History:        Patient has prior history of Echocardiogram examinations, most                 recent 12/11/2022. CHF and Cardiomyopathy, Angina and CAD, COPD,                 Arrythmias:LBBB and Bradycardia, Signs/Symptoms:Chest Pain,                 Dyspnea and Syncope; Risk Factors:Hypertension, Sleep Apnea,                 Dyslipidemia and Current Smoker.   Sonographer:    Lucendia Herrlich Referring Phys: [phone] PETER M [place] IMPRESSIONS  1. Left ventricular ejection fraction, by estimation, is 50 to 55%. The left ventricle has low normal function. The left ventricle has no regional wall motion abnormalities. There is moderate concentric left ventricular hypertrophy. Left ventricular diastolic parameters are consistent with Grade I diastolic dysfunction (impaired relaxation). There is dyskinesis of the left ventricular, basal inferior wall. There is akinesis of the left ventricular, basal-mid inferolateral wall.  2. Right ventricular systolic function is normal. The right ventricular size is normal. Tricuspid regurgitation signal is inadequate for assessing PA pressure.  3. The mitral valve is normal in structure. Mild mitral valve regurgitation. No evidence of mitral stenosis.  4. The aortic valve is tricuspid. Aortic valve regurgitation is not visualized. Aortic valve sclerosis/calcification is present, without any evidence of aortic stenosis.  5. The inferior vena cava is normal in size with greater than 50% respiratory variability, suggesting right atrial pressure of 3 mmHg. FINDINGS  Left Ventricle: Left ventricular ejection fraction, by estimation, is 50 to 55%. The left ventricle has low normal function. The left ventricle has no regional wall motion abnormalities. The left ventricular internal cavity size was normal in size. There is moderate concentric left ventricular hypertrophy. Left ventricular diastolic parameters are consistent with Grade I diastolic dysfunction (impaired  relaxation). Normal left ventricular filling pressure. Right Ventricle: The right ventricular size is normal. No increase in right ventricular wall thickness. Right ventricular systolic function is normal. Tricuspid regurgitation signal is inadequate for assessing PA pressure. Left Atrium: Left atrial size was normal in size. Right Atrium: Right atrial size was normal in size.  Pericardium: There is no evidence of pericardial effusion. Mitral Valve: The mitral valve is normal in structure. Mild mitral valve regurgitation. No evidence of mitral valve stenosis. Tricuspid Valve: The tricuspid valve is normal in structure. Tricuspid valve regurgitation is not demonstrated. No evidence of tricuspid stenosis. Aortic Valve: The aortic valve is tricuspid. Aortic valve regurgitation is not visualized. Aortic valve sclerosis/calcification is present, without any evidence of aortic stenosis. Pulmonic Valve: The pulmonic valve was normal in structure. Pulmonic valve regurgitation is not visualized. No evidence of pulmonic stenosis. Aorta: The aortic root is normal in size and structure. Venous: The inferior vena cava is normal in size with greater than 50% respiratory variability, suggesting right atrial pressure of 3 mmHg. IAS/Shunts: No atrial level shunt detected by color flow Doppler. Additional Comments: A device lead is visualized.  LEFT VENTRICLE PLAX 2D LVIDd:         4.00 cm   Diastology LVIDs:         3.20 cm   LV e' medial:    4.50 cm/s LV PW:         1.60 cm   LV E/e' medial:  14.4 LV IVS:        1.47 cm   LV e' lateral:   5.48 cm/s LVOT diam:     2.40 cm   LV E/e' lateral: 11.8 LVOT Area:     4.52 cm  LEFT ATRIUM         Index LA diam:    2.90 cm 1.63 cm/m   AORTA Ao Root diam: 3.30 cm Ao Asc diam:  3.20 cm MITRAL VALVE MV Area (PHT): 3.85 cm    SHUNTS MV Decel Time: 197 msec    Systemic Diam: 2.40 cm MV E velocity: 64.90 cm/s MV A velocity: 98.80 cm/s MV E/A ratio:  0.66 Armanda Magic MD Electronically signed by Armanda Magic MD Signature Date/Time: 12/14/2022/3:52:15 PM    Final    DG CHEST PORT 1 VIEW  Result Date: 12/13/2022 CLINICAL DATA:  [id number] with chest pain superiorly. EXAM: PORTABLE CHEST 1 VIEW COMPARISON:  CT chest, abdomen and pelvis recently 12/06/2022 FINDINGS: 8:09 p.m. A left chest dual lead pacing system with AID wire is again shown with stable wire insertions. There  is mild cardiomegaly. Normal caliber vascular markings. The lungs are mildly emphysematous but clear. There is mild aortic tortuosity with patchy calcific plaques and a stable mediastinal configuration. No pleural effusion is seen or pneumothorax. Regional osseous structures grossly intact. IMPRESSION: No evidence of acute chest disease. Stable COPD chest with cardiomegaly. Aortic atherosclerosis. Electronically Signed   By: Almira Bar M.D.   On: 12/13/2022 20:40    Procedures   Medications Ordered in ED Medications  morphine (PF) 2 MG/ML injection 2 mg (2 mg Intravenous Given 12/14/22 2333)  lactated ringers bolus 500 mL (500 mLs Intravenous New Bag/Given 12/14/22 2333)    ED Course/ Medical Decision Making/ A&P                           Medical Decision Making Amount and/or Complexity of Data Reviewed Labs: ordered. Radiology: ordered.  Risk Prescription drug  management. Decision regarding hospitalization.    [name] is a 82 y.o. male with PMH HFrEF, ICM, COPD, smoking history, HTN, HLD, CAD now status post recent stent to the mid LAD and RCA who was discharged from the cardiology service earlier today and is presenting to the ED with anginal chest pain and possible episodes of syncope which she describes as similar to how he felt during his previous MI.  Of note, he states that he did take all of his prescribed doses of cardiac medicines today.  He is hemodynamically stable, on room air and oxygenating well, ventricularly paced on presentation.  Exam is largely unremarkable, but he appears to be in distress.  EKG is consistent with ventricular pacing without any new changes.  No evidence of arrhythmia during his possible syncopal episodes.  He states that he had 1 when I was assessing him, but it does not look like he lost consciousness.  Low suspicion for pneumonia or COPD exacerbation given his benign respiratory exam.  Presentation is clinically not consistent with a heart  failure exacerbation.  Wells score is low and he has no clinical signs of DVT.  Etiologies for his pain is likely cardiac in nature, DDx includes reperfusion pain, restenosis, pericardial effusion or myopericarditis, though his echo earlier today did not show any effusion or signs of pericarditis.  Cardiac workup is pending.  Will also involve cardiology for further guidance.  Update: He is still endorsing chest pain.  His troponins are now over 9000, was slightly over 8000 earlier today at the time of his discharge and was trending up over the past few days, thought to be due to possible embolic event during his cath per chart review.  He also has worsening AKI with creatinine 2.47 as well as AST and T. bili elevation and hyperkalemia at 5.6.  Leukocytosis at 17.9, up from 6.4 yesterday.  No clear sign of infection at this time however, question if inflammatory response.  BNP is still pending.  Chest x-ray reports only cardiomegaly, appears similar to his previous chest x-ray yesterday.  Will treat symptomatically at this time.  Cardiology to evaluate him, but I believe he warrants admission for his troponinemia and AKI.  Final Clinical Impression(s) / ED Diagnoses Final diagnoses:  Chest pain, unspecified type  Elevated troponin  AKI (acute kidney injury)    Rx / DC Orders ED Discharge Orders     None         Lyndle Herrlich, MD 12/14/22 1610    Lyndle Herrlich, MD 12/14/22 2340    Tegeler, Canary Brim, MD 12/15/22 0000

## 2022-12-15 ENCOUNTER — Inpatient Hospital Stay (HOSPITAL_COMMUNITY): Payer: Medicare Other

## 2022-12-15 DIAGNOSIS — Z7951 Long term (current) use of inhaled steroids: Secondary | ICD-10-CM | POA: Diagnosis not present

## 2022-12-15 DIAGNOSIS — N1411 Contrast-induced nephropathy: Secondary | ICD-10-CM | POA: Diagnosis not present

## 2022-12-15 DIAGNOSIS — R7989 Other specified abnormal findings of blood chemistry: Secondary | ICD-10-CM

## 2022-12-15 DIAGNOSIS — I4819 Other persistent atrial fibrillation: Secondary | ICD-10-CM

## 2022-12-15 DIAGNOSIS — I959 Hypotension, unspecified: Secondary | ICD-10-CM | POA: Diagnosis not present

## 2022-12-15 DIAGNOSIS — I428 Other cardiomyopathies: Secondary | ICD-10-CM | POA: Diagnosis present

## 2022-12-15 DIAGNOSIS — R1013 Epigastric pain: Secondary | ICD-10-CM

## 2022-12-15 DIAGNOSIS — I214 Non-ST elevation (NSTEMI) myocardial infarction: Secondary | ICD-10-CM

## 2022-12-15 DIAGNOSIS — G8929 Other chronic pain: Secondary | ICD-10-CM | POA: Diagnosis present

## 2022-12-15 DIAGNOSIS — I480 Paroxysmal atrial fibrillation: Secondary | ICD-10-CM

## 2022-12-15 DIAGNOSIS — Z87891 Personal history of nicotine dependence: Secondary | ICD-10-CM | POA: Diagnosis not present

## 2022-12-15 DIAGNOSIS — Z886 Allergy status to analgesic agent status: Secondary | ICD-10-CM | POA: Diagnosis not present

## 2022-12-15 DIAGNOSIS — J4489 Other specified chronic obstructive pulmonary disease: Secondary | ICD-10-CM | POA: Diagnosis present

## 2022-12-15 DIAGNOSIS — R109 Unspecified abdominal pain: Secondary | ICD-10-CM | POA: Diagnosis present

## 2022-12-15 DIAGNOSIS — E785 Hyperlipidemia, unspecified: Secondary | ICD-10-CM | POA: Diagnosis present

## 2022-12-15 DIAGNOSIS — I5042 Chronic combined systolic (congestive) and diastolic (congestive) heart failure: Secondary | ICD-10-CM | POA: Diagnosis present

## 2022-12-15 DIAGNOSIS — R0789 Other chest pain: Secondary | ICD-10-CM | POA: Diagnosis present

## 2022-12-15 DIAGNOSIS — Z95810 Presence of automatic (implantable) cardiac defibrillator: Secondary | ICD-10-CM | POA: Diagnosis not present

## 2022-12-15 DIAGNOSIS — Z7902 Long term (current) use of antithrombotics/antiplatelets: Secondary | ICD-10-CM | POA: Diagnosis not present

## 2022-12-15 DIAGNOSIS — R079 Chest pain, unspecified: Secondary | ICD-10-CM

## 2022-12-15 DIAGNOSIS — Z5986 Financial insecurity: Secondary | ICD-10-CM | POA: Diagnosis not present

## 2022-12-15 DIAGNOSIS — I252 Old myocardial infarction: Secondary | ICD-10-CM | POA: Diagnosis not present

## 2022-12-15 DIAGNOSIS — I255 Ischemic cardiomyopathy: Secondary | ICD-10-CM | POA: Diagnosis present

## 2022-12-15 DIAGNOSIS — I110 Hypertensive heart disease with heart failure: Secondary | ICD-10-CM | POA: Diagnosis present

## 2022-12-15 DIAGNOSIS — N179 Acute kidney failure, unspecified: Secondary | ICD-10-CM | POA: Diagnosis present

## 2022-12-15 DIAGNOSIS — I2510 Atherosclerotic heart disease of native coronary artery without angina pectoris: Secondary | ICD-10-CM | POA: Diagnosis present

## 2022-12-15 DIAGNOSIS — T508X5A Adverse effect of diagnostic agents, initial encounter: Secondary | ICD-10-CM | POA: Diagnosis not present

## 2022-12-15 DIAGNOSIS — F32A Depression, unspecified: Secondary | ICD-10-CM | POA: Diagnosis present

## 2022-12-15 DIAGNOSIS — Z79899 Other long term (current) drug therapy: Secondary | ICD-10-CM | POA: Diagnosis not present

## 2022-12-15 DIAGNOSIS — I442 Atrioventricular block, complete: Secondary | ICD-10-CM | POA: Diagnosis present

## 2022-12-15 LAB — TROPONIN I (HIGH SENSITIVITY)
Troponin I (High Sensitivity): 8906 ng/L (ref <18)
Troponin I (High Sensitivity): 9283 ng/L (ref <18)
Troponin I (High Sensitivity): 6685 ng/L (ref <18)
Troponin I (High Sensitivity): 9381 ng/L (ref <18)
Troponin I (High Sensitivity): 8789 ng/L (ref <18)
Troponin I (High Sensitivity): 8487 ng/L (ref <18)

## 2022-12-15 LAB — CBC
HCT: 33.2 % — ABNORMAL LOW (ref 39.0–52.0)
HCT: 32.5 % — ABNORMAL LOW (ref 39.0–52.0)
Hemoglobin: 10.7 g/dL — ABNORMAL LOW (ref 13.0–17.0)
Hemoglobin: 10.5 g/dL — ABNORMAL LOW (ref 13.0–17.0)
MCH: 29.3 pg (ref 26.0–34.0)
MCH: 29.3 pg (ref 26.0–34.0)
MCHC: 32.2 g/dL (ref 30.0–36.0)
MCHC: 32.3 g/dL (ref 30.0–36.0)
MCV: 91 fL (ref 80.0–100.0)
MCV: 90.8 fL (ref 80.0–100.0)
Platelets: 182 10*3/uL (ref 150–400)
Platelets: 182 10*3/uL (ref 150–400)
RBC: 3.65 MIL/uL — ABNORMAL LOW (ref 4.22–5.81)
RBC: 3.58 MIL/uL — ABNORMAL LOW (ref 4.22–5.81)
RDW: 14.1 % (ref 11.5–15.5)
RDW: 14.1 % (ref 11.5–15.5)
WBC: 17 10*3/uL — ABNORMAL HIGH (ref 4.0–10.5)
WBC: 16.8 10*3/uL — ABNORMAL HIGH (ref 4.0–10.5)
nRBC: 0 % (ref 0.0–0.2)
nRBC: 0 % (ref 0.0–0.2)

## 2022-12-15 LAB — BASIC METABOLIC PANEL
Anion gap: 14 (ref 5–15)
Anion gap: 16 — ABNORMAL HIGH (ref 5–15)
BUN: 36 mg/dL — ABNORMAL HIGH (ref 8–23)
BUN: 40 mg/dL — ABNORMAL HIGH (ref 8–23)
CO2: 19 mmol/L — ABNORMAL LOW (ref 22–32)
CO2: 15 mmol/L — ABNORMAL LOW (ref 22–32)
Calcium: 8.6 mg/dL — ABNORMAL LOW (ref 8.9–10.3)
Calcium: 7.8 mg/dL — ABNORMAL LOW (ref 8.9–10.3)
Chloride: 102 mmol/L (ref 98–111)
Chloride: 103 mmol/L (ref 98–111)
Creatinine, Ser: 2.35 mg/dL — ABNORMAL HIGH (ref 0.61–1.24)
Creatinine, Ser: 2.53 mg/dL — ABNORMAL HIGH (ref 0.61–1.24)
GFR, Estimated: 27 mL/min — ABNORMAL LOW (ref >60)
GFR, Estimated: 25 mL/min — ABNORMAL LOW (ref >60)
Glucose, Bld: 113 mg/dL — ABNORMAL HIGH (ref 70–99)
Glucose, Bld: 92 mg/dL (ref 70–99)
Potassium: 5.5 mmol/L — ABNORMAL HIGH (ref 3.5–5.1)
Potassium: 4.7 mmol/L (ref 3.5–5.1)
Sodium: 135 mmol/L (ref 135–145)
Sodium: 134 mmol/L — ABNORMAL LOW (ref 135–145)

## 2022-12-15 LAB — ECHOCARDIOGRAM LIMITED
Area-P 1/2: 3.53 cm2
Calc EF: 35.4 %
Height: 68 in
MV M vel: 4.95 m/s
MV Peak grad: 98 mmHg
S' Lateral: 3.1 cm
Single Plane A2C EF: 35.7 %
Single Plane A4C EF: 34 %
Weight: 2292.78 oz

## 2022-12-15 LAB — BRAIN NATRIURETIC PEPTIDE: B Natriuretic Peptide: 1131.7 pg/mL — ABNORMAL HIGH (ref 0.0–100.0)

## 2022-12-15 LAB — MAGNESIUM: Magnesium: 1.9 mg/dL (ref 1.7–2.4)

## 2022-12-15 LAB — HEPARIN LEVEL (UNFRACTIONATED): Heparin Unfractionated: 0.2 IU/mL — ABNORMAL LOW (ref 0.30–0.70)

## 2022-12-15 MED ORDER — SODIUM CHLORIDE 0.9% FLUSH
3.0000 mL | Freq: Two times a day (BID) | INTRAVENOUS | Status: DC
  Administered 2022-12-15 – 2022-12-18 (×7): 3 mL via INTRAVENOUS

## 2022-12-15 MED ORDER — ACETAMINOPHEN 325 MG PO TABS
650.0000 mg | ORAL_TABLET | Freq: Three times a day (TID) | ORAL | Status: DC
  Administered 2022-12-15 – 2022-12-17 (×8): 650 mg via ORAL
  Filled 2022-12-15 (×8): qty 2

## 2022-12-15 MED ORDER — NITROGLYCERIN 0.4 MG SL SUBL: 0.4000 mg | SUBLINGUAL_TABLET | SUBLINGUAL | Status: DC | PRN

## 2022-12-15 MED ORDER — RANOLAZINE ER 500 MG PO TB12
500.0000 mg | ORAL_TABLET | Freq: Two times a day (BID) | ORAL | Status: DC
  Administered 2022-12-15: 500 mg via ORAL
  Filled 2022-12-15: qty 1

## 2022-12-15 MED ORDER — HEPARIN (PORCINE) 25000 UT/250ML-% IV SOLN
1000.0000 [IU]/h | INTRAVENOUS | Status: DC
  Administered 2022-12-15: 800 [IU]/h via INTRAVENOUS
  Administered 2022-12-16: 1000 [IU]/h via INTRAVENOUS
  Filled 2022-12-15 (×2): qty 250

## 2022-12-15 MED ORDER — FAMOTIDINE 20 MG PO TABS
40.0000 mg | ORAL_TABLET | Freq: Every day | ORAL | Status: DC
  Administered 2022-12-15: 40 mg via ORAL
  Filled 2022-12-15: qty 2

## 2022-12-15 MED ORDER — IPRATROPIUM-ALBUTEROL 0.5-2.5 (3) MG/3ML IN SOLN: 3.0000 mL | Freq: Four times a day (QID) | RESPIRATORY_TRACT | Status: DC | PRN

## 2022-12-15 MED ORDER — LACTATED RINGERS IV BOLUS
500.0000 mL | Freq: Once | INTRAVENOUS | Status: AC
  Administered 2022-12-15: 500 mL via INTRAVENOUS

## 2022-12-15 MED ORDER — METOPROLOL SUCCINATE ER 100 MG PO TB24
200.0000 mg | ORAL_TABLET | Freq: Every day | ORAL | Status: DC
  Administered 2022-12-16 – 2022-12-18 (×3): 200 mg via ORAL
  Filled 2022-12-15: qty 2
  Filled 2022-12-15: qty 8
  Filled 2022-12-15 (×2): qty 2

## 2022-12-15 MED ORDER — AMLODIPINE BESYLATE 5 MG PO TABS: 10.0000 mg | ORAL_TABLET | Freq: Every day | ORAL | Status: DC

## 2022-12-15 MED ORDER — MORPHINE SULFATE (PF) 2 MG/ML IV SOLN: 2.0000 mg | INTRAVENOUS | Status: DC | PRN

## 2022-12-15 MED ORDER — PANTOPRAZOLE SODIUM 40 MG IV SOLR
40.0000 mg | Freq: Two times a day (BID) | INTRAVENOUS | Status: DC
  Administered 2022-12-15 – 2022-12-18 (×8): 40 mg via INTRAVENOUS
  Filled 2022-12-15 (×8): qty 10

## 2022-12-15 MED ORDER — ONDANSETRON HCL 4 MG/2ML IJ SOLN: 4.0000 mg | Freq: Four times a day (QID) | INTRAMUSCULAR | Status: DC | PRN

## 2022-12-15 MED ORDER — ASPIRIN 81 MG PO TBEC
81.0000 mg | DELAYED_RELEASE_TABLET | Freq: Every day | ORAL | Status: DC
  Administered 2022-12-15 – 2022-12-18 (×4): 81 mg via ORAL
  Filled 2022-12-15 (×4): qty 1

## 2022-12-15 MED ORDER — AMIODARONE HCL 200 MG PO TABS: 200.0000 mg | ORAL_TABLET | Freq: Every day | ORAL | Status: DC

## 2022-12-15 MED ORDER — SACUBITRIL-VALSARTAN 97-103 MG PO TABS
1.0000 | ORAL_TABLET | Freq: Two times a day (BID) | ORAL | Status: DC
  Filled 2022-12-15: qty 1

## 2022-12-15 MED ORDER — LACTATED RINGERS IV SOLN: INTRAVENOUS | Status: AC

## 2022-12-15 MED ORDER — MOMETASONE FURO-FORMOTEROL FUM 200-5 MCG/ACT IN AERO
2.0000 | INHALATION_SPRAY | Freq: Two times a day (BID) | RESPIRATORY_TRACT | Status: DC
  Administered 2022-12-15 – 2022-12-18 (×7): 2 via RESPIRATORY_TRACT
  Filled 2022-12-15: qty 8.8

## 2022-12-15 MED ORDER — ATORVASTATIN CALCIUM 80 MG PO TABS
80.0000 mg | ORAL_TABLET | Freq: Every day | ORAL | Status: DC
  Administered 2022-12-15 – 2022-12-18 (×4): 80 mg via ORAL
  Filled 2022-12-15 (×3): qty 1
  Filled 2022-12-15: qty 2

## 2022-12-15 MED ORDER — LACTATED RINGERS IV BOLUS: 500.0000 mL | Freq: Once | INTRAVENOUS | Status: DC

## 2022-12-15 MED ORDER — ACETAMINOPHEN 325 MG PO TABS: 650.0000 mg | ORAL_TABLET | Freq: Four times a day (QID) | ORAL | Status: DC | PRN

## 2022-12-15 MED ORDER — ALUM & MAG HYDROXIDE-SIMETH 200-200-20 MG/5ML PO SUSP
30.0000 mL | Freq: Once | ORAL | Status: AC
  Administered 2022-12-15: 30 mL via ORAL
  Filled 2022-12-15: qty 30

## 2022-12-15 MED ORDER — POLYETHYLENE GLYCOL 3350 17 G PO PACK: 17.0000 g | PACK | Freq: Every day | ORAL | Status: DC | PRN

## 2022-12-15 MED ORDER — GUAIFENESIN-DM 100-10 MG/5ML PO SYRP: 10.0000 mL | ORAL_SOLUTION | Freq: Four times a day (QID) | ORAL | Status: DC | PRN

## 2022-12-15 MED ORDER — FAMOTIDINE 20 MG PO TABS
20.0000 mg | ORAL_TABLET | Freq: Every day | ORAL | Status: DC
  Administered 2022-12-16 – 2022-12-18 (×3): 20 mg via ORAL
  Filled 2022-12-15 (×3): qty 1

## 2022-12-15 MED ORDER — HEPARIN BOLUS VIA INFUSION
4000.0000 [IU] | Freq: Once | INTRAVENOUS | Status: AC
  Administered 2022-12-15: 4000 [IU] via INTRAVENOUS
  Filled 2022-12-15: qty 4000

## 2022-12-15 MED ORDER — ONDANSETRON HCL 4 MG PO TABS: 4.0000 mg | ORAL_TABLET | Freq: Four times a day (QID) | ORAL | Status: DC | PRN

## 2022-12-15 MED ORDER — ACETAMINOPHEN 650 MG RE SUPP: 650.0000 mg | Freq: Four times a day (QID) | RECTAL | Status: DC | PRN

## 2022-12-15 MED ORDER — LIDOCAINE VISCOUS HCL 2 % MT SOLN
15.0000 mL | Freq: Once | OROMUCOSAL | Status: AC
  Administered 2022-12-15: 15 mL via ORAL
  Filled 2022-12-15: qty 15

## 2022-12-15 MED ORDER — LIDOCAINE 5 % EX PTCH
1.0000 | MEDICATED_PATCH | CUTANEOUS | Status: DC
  Administered 2022-12-15 – 2022-12-18 (×4): 1 via TRANSDERMAL
  Filled 2022-12-15 (×4): qty 1

## 2022-12-15 MED ORDER — TICAGRELOR 90 MG PO TABS
90.0000 mg | ORAL_TABLET | Freq: Two times a day (BID) | ORAL | Status: DC
  Administered 2022-12-15 – 2022-12-18 (×8): 90 mg via ORAL
  Filled 2022-12-15 (×8): qty 1

## 2022-12-15 MED ORDER — FLUTICASONE PROPIONATE 50 MCG/ACT NA SUSP
2.0000 | Freq: Every day | NASAL | Status: DC
  Administered 2022-12-15 – 2022-12-18 (×2): 2 via NASAL
  Filled 2022-12-15: qty 16
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
## 2022-12-15 NOTE — Progress Notes (Signed)
  Echocardiogram 2D Echocardiogram has been performed.  Delcie Roch 12/15/2022, 9:37 AM

## 2022-12-15 NOTE — ED Notes (Signed)
ED TO INPATIENT HANDOFF REPORT  ED Nurse Name and Phone #: Victorino Dike [phone]  S Name/Age/Gender [name] 82 y.o. male Room/Bed: 004C/004C  Code Status   Code Status: Full Code  Home/SNF/Other Home Patient oriented to: self, place, time, and situation Is this baseline? Yes   Triage Complete: Triage complete  Chief Complaint Abdominal pain [R10.9] Chest pain [R07.9]  Triage Note No notes on file   Allergies Allergies  Allergen Reactions   Acyclovir And Related     GI upset    Aspirin Other (See Comments)    GI upset at high doses only.    Level of Care/Admitting Diagnosis ED Disposition     ED Disposition  Admit   Condition  --   Comment  Hospital Area: [facility] [[id number]]  Level of Care: Telemetry Cardiac [[id number]]  May admit patient to [name] or [facility] if equivalent level of care is available:: No  Covid Evaluation: Confirmed COVID Negative  Diagnosis: Chest pain [161096]  Admitting Physician: Rachael Fee [[id number]]  Attending Physician: Lewayne Bunting [[id number]]  Certification:: I certify this patient will need inpatient services for at least 2 midnights          B Medical/Surgery History Past Medical History:  Diagnosis Date   Acute renal failure 05/31/2014   Angina decubitus 06/15/2014   CAD (coronary artery disease)    a. s/p stent RCA DES 9/09; b. 2014 Attempted PCI of OM1 @ [place];  c. 04/2014 Cath: LAD 40-50p, D1 95-99 (chronic), LCX 30-40 inf branch, OM1 CTO, RCA 30-40p, RCA patent stent, EF 35%->Med Rx.   Chronic combined systolic and diastolic CHF (congestive heart failure)    a. EF about 40 to 45% per echo in April 2013;  b. 04/2014 Echo: EF 20-25%, sev LVH, sev glob HK, Gr 1 DD, mildly reduced RV fxn, PASP . c. 01/2017: EF improved to 50-55%.    Chronic low back pain    Complete heart block    COPD (chronic obstructive pulmonary disease) dx 06/2013   PFTs 07/08/13: mod obst with resp to bronchodilator,  moderate decrease diffusion, airtrapping   COPD with acute exacerbation 01/17/2017   COPD with asthma    07/08/13 PFT: FEV1 1.74L (66% pred, 30% change with BD), mod obst with resp to bronchodilator, moderate decrease diffusion, air-trapping 11/2013 Simple spiro>> clear obstruction, FEV1 1.30 L (47% pred) - trial of symbicort 160 2bid 01/26/15      Depression 09/15/2018   Assessment: Increased sadness and depression since losing job  Plan: Patient denies suicidal and homicidal ideations Patient would not like to start medication therapies at this time Will establish patient with [facility] for primary care   Dyslipidemia    a. on statin   Essential hypertension    HLD (hyperlipidemia)    HTN (hypertension)    a. Reports intolerance to hydralazine; b. no beta blockers 2/2 bradycardia;  c. failed on ACE and ARB.   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 04/2014 Echo: EF 20-25%, sev glob HK.   Mixed Ischemic/Non-Ischemic Cardiomyopathy    probable mixed ischemic and non-ischemic    Noncompliance    OSA (obstructive sleep apnea) 04/18/2018   06/11/16 - home sleep study shows AHI of 2.9 an hour with the lowest SaO2 of 79% with an average of  93%  03/08/2018-Home sleep study-AHI 7/HR, SaO2 low 81%    Paroxysmal atrial fibrillation    a. identified on device interrogation 01/2016   Pneumonia 09/02/2013   Pulmonary edema 05/10/2014   Respiratory arrest 05/18/2014   Sinus bradycardia 05/31/2014   Ventricular tachycardia    treated with ATP,  CL 250-300 msec   Past Surgical History:  Procedure Laterality Date   BI-VENTRICULAR IMPLANTABLE CARDIOVERTER DEFIBRILLATOR N/A 08/12/2014   MDT Ovidio Kin XT CRTD implanted by Dr Johney Frame   BIV ICD GENERATOR CHANGEOUT N/A 08/08/2021   Procedure: BIV ICD GENERATOR CHANGEOUT;  Surgeon: Hillis Range, MD;  Location: [facility] INVASIVE CV LAB;  Service: Cardiovascular;  Laterality: N/A;   CARDIAC CATHETERIZATION     ejection fraction 50%   CORONARY STENT INTERVENTION N/A 12/10/2022   Procedure: CORONARY STENT INTERVENTION;  Surgeon: Swaziland, Peter M, MD;  Location: [facility] INVASIVE CV LAB;  Service: Cardiovascular;  Laterality: N/A;   CORONARY STENT INTERVENTION N/A 12/12/2022   Procedure: CORONARY STENT INTERVENTION;  Surgeon: Swaziland, Peter M, MD;  Location: [facility] INVASIVE CV LAB;  Service: Cardiovascular;  Laterality: N/A;   CORONARY ULTRASOUND/IVUS N/A 12/10/2022   Procedure: Intravascular Ultrasound/IVUS;  Surgeon: Swaziland, Peter M, MD;  Location: [facility] INVASIVE CV LAB;  Service: Cardiovascular;  Laterality: N/A;   CORONARY ULTRASOUND/IVUS N/A 12/12/2022   Procedure: Coronary Ultrasound/IVUS;  Surgeon: Swaziland, Peter M, MD;  Location: [facility] INVASIVE CV LAB;  Service: Cardiovascular;  Laterality: N/A;   LEFT HEART CATH AND CORONARY ANGIOGRAPHY N/A 12/10/2022   Procedure: LEFT HEART CATH AND CORONARY ANGIOGRAPHY;  Surgeon: Swaziland, Peter M, MD;  Location: [facility] INVASIVE CV LAB;  Service: Cardiovascular;  Laterality: N/A;   LEFT HEART CATHETERIZATION WITH CORONARY ANGIOGRAM N/A 05/08/2014   Procedure: LEFT HEART CATHETERIZATION WITH CORONARY ANGIOGRAM;  Surgeon: Micheline Chapman, MD;  Location: [facility] CATH LAB;  Service: Cardiovascular;  Laterality: N/A;     A IV Location/Drains/Wounds Patient Lines/Drains/Airways Status     Active Line/Drains/Airways     Name Placement date Placement time Site Days   Peripheral IV 12/14/22 20 G Anterior;Right Forearm 12/14/22  2307  Forearm  1            Intake/Output Last 24 hours  Intake/Output Summary (Last 24 hours) at 12/15/2022 1116 Last data filed at 12/15/2022 0034 Gross per 24 hour  Intake 500 ml  Output --  Net 500 ml    Labs/Imaging Results for orders placed or performed during the hospital encounter of 12/14/22 (from the past 48 hour(s))  Troponin I (High  Sensitivity)     Status: Abnormal   Collection Time: 12/14/22 10:19 PM  Result Value Ref Range   Troponin I (High Sensitivity) 9,381 (HH) <18 ng/L    Comment: CORRECTED RESULTS CALLED TO: CRITICAL RESULT CALLED TO, READ BACK BY AND VERIFIED WITH: J,KANTLEHNER RN @1106  12/15/22 E,BENTON (NOTE) Elevated high sensitivity troponin I (hsTnI) values and significant  changes across serial measurements may suggest ACS but many other  chronic and acute conditions are known to elevate hsTnI results.  Refer to the Links section for chest pain algorithms and additional  guidance. Performed at [facility], [address], [place], [place] [address] CORRECTED ON 04/06 AT 1109: PREVIOUSLY REPORTED AS 9381 CRITICAL VALUE NOTED. VALUE IS CONSISTENT WITH PREVIOUSLY REPORTED/CALLED VALUE   Brain natriuretic peptide     Status: Abnormal   Collection Time: 12/14/22 10:19 PM  Result Value Ref Range   B Natriuretic Peptide 1,131.7 (H) 0.0 - 100.0 pg/mL  Comment: Performed at [facility], [address] [place], [place] [address]  CBC with Differential     Status: Abnormal   Collection Time: 12/14/22 10:19 PM  Result Value Ref Range   WBC 17.9 (H) 4.0 - 10.5 K/uL   RBC 4.27 4.22 - 5.81 MIL/uL   Hemoglobin 12.0 (L) 13.0 - 17.0 g/dL   HCT 60.4 54.0 - 98.1 %   MCV 91.3 80.0 - 100.0 fL   MCH 28.1 26.0 - 34.0 pg   MCHC 30.8 30.0 - 36.0 g/dL   RDW 19.1 47.8 - 29.5 %   Platelets 218 150 - 400 K/uL   nRBC 0.0 0.0 - 0.2 %   Neutrophils Relative % 85 %   Neutro Abs 15.2 (H) 1.7 - 7.7 K/uL   Lymphocytes Relative 6 %   Lymphs Abs 1.1 0.7 - 4.0 K/uL   Monocytes Relative 8 %   Monocytes Absolute 1.5 (H) 0.1 - 1.0 K/uL   Eosinophils Relative 0 %   Eosinophils Absolute 0.0 0.0 - 0.5 K/uL   Basophils Relative 0 %   Basophils Absolute 0.0 0.0 - 0.1 K/uL   Immature Granulocytes 1 %   Abs Immature Granulocytes 0.09 (H) 0.00 - 0.07 K/uL    Comment: Performed at [facility], [address] [place], [place] [address]  Comprehensive metabolic panel     Status: Abnormal   Collection Time: 12/14/22 10:19 PM  Result Value Ref Range   Sodium 134 (L) 135 - 145 mmol/L   Potassium 5.6 (H) 3.5 - 5.1 mmol/L   Chloride 98 98 - 111 mmol/L   CO2 21 (L) 22 - 32 mmol/L   Glucose, Bld 142 (H) 70 - 99 mg/dL    Comment: Glucose reference range applies only to samples taken after fasting for at least 8 hours.   BUN 35 (H) 8 - 23 mg/dL   Creatinine, Ser 8.65 (H) 0.61 - 1.24 mg/dL   Calcium 9.2 8.9 - 78.4 mg/dL   Total Protein 8.4 (H) 6.5 - 8.1 g/dL   Albumin 4.0 3.5 - 5.0 g/dL   AST 69 (H) 15 - 41 U/L   ALT 31 0 - 44 U/L   Alkaline Phosphatase 88 38 - 126 U/L   Total Bilirubin 1.3 (H) 0.3 - 1.2 mg/dL   GFR, Estimated 26 (L) >60 mL/min    Comment: (NOTE) Calculated using the CKD-EPI Creatinine Equation (2021)    Anion gap 15 5 - 15    Comment: Performed at [facility], [address] [place], [place] [address]  Troponin I (High Sensitivity)     Status: Abnormal   Collection Time: 12/15/22 12:20 AM  Result Value Ref Range   Troponin I (High Sensitivity) 8,906 (HH) <18 ng/L    Comment: CRITICAL VALUE NOTED. VALUE IS CONSISTENT WITH PREVIOUSLY REPORTED/CALLED VALUE (NOTE) Elevated high sensitivity troponin I (hsTnI) values and significant  changes across serial measurements may suggest ACS but many other  chronic and acute conditions are known to elevate hsTnI results.  Refer to the "Links" section for chest pain algorithms and additional  guidance. Performed at [facility], [address], [place], [place] [address]   CBC     Status: Abnormal   Collection Time: 12/15/22 12:20 AM  Result Value Ref Range   WBC 17.0 (H) 4.0 - 10.5 K/uL   RBC 3.65 (L) 4.22 - 5.81 MIL/uL   Hemoglobin 10.7 (L) 13.0 - 17.0 g/dL  HCT 33.2 (L) 39.0 - 52.0 %   MCV 91.0 80.0 - 100.0 fL   MCH 29.3 26.0 - 34.0 pg   MCHC 32.2 30.0 - 36.0 g/dL   RDW 16.1 09.6 - 04.5 %   Platelets 182 150 -  400 K/uL   nRBC 0.0 0.0 - 0.2 %    Comment: Performed at [facility], [address] [place], [place] [address]  Basic metabolic panel     Status: Abnormal   Collection Time: 12/15/22 12:20 AM  Result Value Ref Range   Sodium 135 135 - 145 mmol/L   Potassium 5.5 (H) 3.5 - 5.1 mmol/L   Chloride 102 98 - 111 mmol/L   CO2 19 (L) 22 - 32 mmol/L   Glucose, Bld 113 (H) 70 - 99 mg/dL    Comment: Glucose reference range applies only to samples taken after fasting for at least 8 hours.   BUN 36 (H) 8 - 23 mg/dL   Creatinine, Ser 1.91 (H) 0.61 - 1.24 mg/dL   Calcium 8.6 (L) 8.9 - 10.3 mg/dL   GFR, Estimated 27 (L) >60 mL/min    Comment: (NOTE) Calculated using the CKD-EPI Creatinine Equation (2021)    Anion gap 14 5 - 15    Comment: Performed at [facility], [address] [place], [place] [address]  Troponin I (High Sensitivity)     Status: Abnormal   Collection Time: 12/15/22  6:20 AM  Result Value Ref Range   Troponin I (High Sensitivity) 9,283 (HH) <18 ng/L    Comment: CRITICAL VALUE NOTED. VALUE IS CONSISTENT WITH PREVIOUSLY REPORTED/CALLED VALUE (NOTE) Elevated high sensitivity troponin I (hsTnI) values and significant  changes across serial measurements may suggest ACS but many other  chronic and acute conditions are known to elevate hsTnI results.  Refer to the "Links" section for chest pain algorithms and additional  guidance. Performed at [facility], [address], [place], [place] [address]   CBC     Status: Abnormal   Collection Time: 12/15/22  7:25 AM  Result Value Ref Range   WBC 16.8 (H) 4.0 - 10.5 K/uL   RBC 3.58 (L) 4.22 - 5.81 MIL/uL   Hemoglobin 10.5 (L) 13.0 - 17.0 g/dL   HCT 08.6 (L) 57.8 - 46.9 %   MCV 90.8 80.0 - 100.0 fL   MCH 29.3 26.0 - 34.0 pg   MCHC 32.3 30.0 - 36.0 g/dL   RDW 62.9 52.8 - 41.3 %   Platelets 182 150 - 400 K/uL   nRBC 0.0 0.0 - 0.2 %    Comment: Performed at [facility], [address] [place],  [place] [address]  Troponin I (High Sensitivity)     Status: Abnormal   Collection Time: 12/15/22  9:44 AM  Result Value Ref Range   Troponin I (High Sensitivity) 6,685 (HH) <18 ng/L    Comment: CRITICAL RESULT CALLED TO, READ BACK BY AND VERIFIED WITH J,KANTLEHNER RN @1051  12/15/22 E,BENTON (NOTE) Elevated high sensitivity troponin I (hsTnI) values and significant  changes across serial measurements may suggest ACS but many other  chronic and acute conditions are known to elevate hsTnI results.  Refer to the "Links" section for chest pain algorithms and additional  guidance. Performed at [facility], [address] [place], [place] [address]    ECHOCARDIOGRAM LIMITED  Result Date: 12/15/2022    ECHOCARDIOGRAM LIMITED REPORT   Patient Name:   JOEVON HOLLIMAN [facility] Date  of Exam: 12/15/2022 Medical Rec #:  [id number]      Height:       68.0 in Accession #:    [id number]     Weight:       143.3 lb Date of Birth:  [birth year]     BSA:          1.774 m Patient Age:    81 years       BP:           107/59 mmHg Patient Gender: M              HR:           62 bpm. Exam Location:  Inpatient Procedure: Limited Echo, Limited Color Doppler and Cardiac Doppler STAT ECHO Indications:    chest pain  History:        Patient has prior history of Echocardiogram examinations, most                 recent 12/14/2022. Cardiomyopathy, CAD, Defibrillator, COPD,                 Arrythmias:Atrial Fibrillation and LBBB; Risk Factors:Sleep                 Apnea, Hypertension, Dyslipidemia and Former Smoker.  Sonographer:    Delcie Roch RDCS Referring Phys: [id number] RHONDA G BARRETT IMPRESSIONS  1. Limited study to assess LV function; hypokinesis of the inferior, inferolateral wall with moderate LV dysfunction.  2. Left ventricular ejection fraction, by estimation, is 35 to 40%. The left ventricle has moderately decreased function. The left ventricle demonstrates regional wall motion abnormalities (see scoring diagram/findings for  description). There is moderate left ventricular hypertrophy.  3. Right ventricular systolic function is severely reduced. The right ventricular size is normal. There is normal pulmonary artery systolic pressure.  4. The mitral valve is normal in structure. Mild mitral valve regurgitation. No evidence of mitral stenosis.  5. The aortic valve is tricuspid. Aortic valve regurgitation is trivial. Aortic valve sclerosis is present, with no evidence of aortic valve stenosis.  6. The inferior vena cava is normal in size with greater than 50% respiratory variability, suggesting right atrial pressure of 3 mmHg. FINDINGS  Left Ventricle: Left ventricular ejection fraction, by estimation, is 35 to 40%. The left ventricle has moderately decreased function. The left ventricle demonstrates regional wall motion abnormalities. The left ventricular internal cavity size was normal in size. There is moderate left ventricular hypertrophy. Right Ventricle: The right ventricular size is normal. Right ventricular systolic function is severely reduced. There is normal pulmonary artery systolic pressure. The tricuspid regurgitant velocity is 1.86 m/s, and with an assumed right atrial pressure of 3 mmHg, the estimated right ventricular systolic pressure is 16.8 mmHg. Left Atrium: Left atrial size was normal in size. Right Atrium: Right atrial size was normal in size. Pericardium: There is no evidence of pericardial effusion. Mitral Valve: The mitral valve is normal in structure. Mild mitral valve regurgitation. No evidence of mitral valve stenosis. Tricuspid Valve: The tricuspid valve is normal in structure. Tricuspid valve regurgitation is mild . No evidence of tricuspid stenosis. Aortic Valve: The aortic valve is tricuspid. Aortic valve regurgitation is trivial. Aortic valve sclerosis is present, with no evidence of aortic valve stenosis. Pulmonic Valve: The pulmonic valve was normal in structure. Pulmonic valve regurgitation is not  visualized. Aorta: The aortic root is normal in size and structure. Venous: The inferior vena cava is normal in size with greater than  50% respiratory variability, suggesting right atrial pressure of 3 mmHg. Additional Comments: Limited study to assess LV function; hypokinesis of the inferior, inferolateral wall with moderate LV dysfunction. A device lead is visualized. Spectral Doppler performed. Color Doppler performed.  LEFT VENTRICLE PLAX 2D LVIDd:         4.40 cm LVIDs:         3.10 cm LV PW:         1.40 cm LV IVS:        1.50 cm  LV Volumes (MOD) LV vol d, MOD A2C: 98.2 ml LV vol d, MOD A4C: 85.8 ml LV vol s, MOD A2C: 63.1 ml LV vol s, MOD A4C: 56.6 ml LV SV MOD A2C:     35.1 ml LV SV MOD A4C:     85.8 ml LV SV MOD BP:      32.6 ml IVC IVC diam: 1.90 cm LEFT ATRIUM         Index LA diam:    3.20 cm 1.80 cm/m  AORTIC VALVE LVOT Vmax:   89.30 cm/s LVOT Vmean:  52.800 cm/s LVOT VTI:    0.167 m MITRAL VALVE               TRICUSPID VALVE MV Area (PHT): 3.53 cm    TR Peak grad:   13.8 mmHg MV Decel Time: 215 msec    TR Vmax:        186.00 cm/s MR Peak grad: 98.0 mmHg MR Mean grad: 59.0 mmHg    SHUNTS MR Vmax:      495.00 cm/s  Systemic VTI: 0.17 m MR Vmean:     351.0 cm/s MV E velocity: 65.60 cm/s MV A velocity: 87.30 cm/s MV E/A ratio:  0.75 Olga Millers MD Electronically signed by Olga Millers MD Signature Date/Time: 12/15/2022/10:18:38 AM    Final    DG CHEST PORT 1 VIEW  Result Date: 12/14/2022 CLINICAL DATA:  Dyspnea EXAM: PORTABLE CHEST 1 VIEW COMPARISON:  12/13/2022 FINDINGS: Multi lead left-sided pacing device. Cardiomegaly with aortic atherosclerosis. No acute airspace disease, pleural effusion or pneumothorax. IMPRESSION: No active disease. Cardiomegaly. Electronically Signed   By: Jasmine Pang M.D.   On: 12/14/2022 23:14   ECHOCARDIOGRAM LIMITED  Result Date: 12/14/2022    ECHOCARDIOGRAM LIMITED REPORT   Patient Name:   MAXFIELD GILDERSLEEVE [facility] Date of Exam: 12/14/2022 Medical Rec #:  [id number]       Height:       68.5 in Accession #:    [id number]     Weight:       143.1 lb Date of Birth:  11-04-1940     BSA:          1.782 m Patient Age:    81 years       BP:           135/62 mmHg Patient Gender: M              HR:           60 bpm. Exam Location:  Inpatient Procedure: Limited Echo and Limited Color Doppler Indications:    Acute myocardial infarction, unspecified I21.9  History:        Patient has prior history of Echocardiogram examinations, most                 recent 12/11/2022. CHF and Cardiomyopathy, Angina and CAD, COPD,                 Arrythmias:LBBB  and Bradycardia, Signs/Symptoms:Chest Pain,                 Dyspnea and Syncope; Risk Factors:Hypertension, Sleep Apnea,                 Dyslipidemia and Current Smoker.  Sonographer:    Lucendia Herrlich Referring Phys: [phone] PETER M [place]aziland IMPRESSIONS  1. Left ventricular ejection fraction, by estimation, is 50 to 55%. The left ventricle has low normal function. The left ventricle has no regional wall motion abnormalities. There is moderate concentric left ventricular hypertrophy. Left ventricular diastolic parameters are consistent with Grade I diastolic dysfunction (impaired relaxation). There is dyskinesis of the left ventricular, basal inferior wall. There is akinesis of the left ventricular, basal-mid inferolateral wall.  2. Right ventricular systolic function is normal. The right ventricular size is normal. Tricuspid regurgitation signal is inadequate for assessing PA pressure.  3. The mitral valve is normal in structure. Mild mitral valve regurgitation. No evidence of mitral stenosis.  4. The aortic valve is tricuspid. Aortic valve regurgitation is not visualized. Aortic valve sclerosis/calcification is present, without any evidence of aortic stenosis.  5. The inferior vena cava is normal in size with greater than 50% respiratory variability, suggesting right atrial pressure of 3 mmHg. FINDINGS  Left Ventricle: Left ventricular ejection fraction, by  estimation, is 50 to 55%. The left ventricle has low normal function. The left ventricle has no regional wall motion abnormalities. The left ventricular internal cavity size was normal in size. There is moderate concentric left ventricular hypertrophy. Left ventricular diastolic parameters are consistent with Grade I diastolic dysfunction (impaired relaxation). Normal left ventricular filling pressure. Right Ventricle: The right ventricular size is normal. No increase in right ventricular wall thickness. Right ventricular systolic function is normal. Tricuspid regurgitation signal is inadequate for assessing PA pressure. Left Atrium: Left atrial size was normal in size. Right Atrium: Right atrial size was normal in size. Pericardium: There is no evidence of pericardial effusion. Mitral Valve: The mitral valve is normal in structure. Mild mitral valve regurgitation. No evidence of mitral valve stenosis. Tricuspid Valve: The tricuspid valve is normal in structure. Tricuspid valve regurgitation is not demonstrated. No evidence of tricuspid stenosis. Aortic Valve: The aortic valve is tricuspid. Aortic valve regurgitation is not visualized. Aortic valve sclerosis/calcification is present, without any evidence of aortic stenosis. Pulmonic Valve: The pulmonic valve was normal in structure. Pulmonic valve regurgitation is not visualized. No evidence of pulmonic stenosis. Aorta: The aortic root is normal in size and structure. Venous: The inferior vena cava is normal in size with greater than 50% respiratory variability, suggesting right atrial pressure of 3 mmHg. IAS/Shunts: No atrial level shunt detected by color flow Doppler. Additional Comments: A device lead is visualized.  LEFT VENTRICLE PLAX 2D LVIDd:         4.00 cm   Diastology LVIDs:         3.20 cm   LV e' medial:    4.50 cm/s LV PW:         1.60 cm   LV E/e' medial:  14.4 LV IVS:        1.47 cm   LV e' lateral:   5.48 cm/s LVOT diam:     2.40 cm   LV E/e'  lateral: 11.8 LVOT Area:     4.52 cm  LEFT ATRIUM         Index LA diam:    2.90 cm 1.63 cm/m   AORTA Ao Root diam:  3.30 cm Ao Asc diam:  3.20 cm MITRAL VALVE MV Area (PHT): 3.85 cm    SHUNTS MV Decel Time: 197 msec    Systemic Diam: 2.40 cm MV E velocity: 64.90 cm/s MV A velocity: 98.80 cm/s MV E/A ratio:  0.66 Armanda Magicraci Turner MD Electronically signed by Armanda Magicraci Turner MD Signature Date/Time: 12/14/2022/3:52:15 PM    Final    DG CHEST PORT 1 VIEW  Result Date: 12/13/2022 CLINICAL DATA:  [id number] with chest pain superiorly. EXAM: PORTABLE CHEST 1 VIEW COMPARISON:  CT chest, abdomen and pelvis recently 12/06/2022 FINDINGS: 8:09 p.m. A left chest dual lead pacing system with AID wire is again shown with stable wire insertions. There is mild cardiomegaly. Normal caliber vascular markings. The lungs are mildly emphysematous but clear. There is mild aortic tortuosity with patchy calcific plaques and a stable mediastinal configuration. No pleural effusion is seen or pneumothorax. Regional osseous structures grossly intact. IMPRESSION: No evidence of acute chest disease. Stable COPD chest with cardiomegaly. Aortic atherosclerosis. Electronically Signed   By: Almira BarKeith  Chesser M.D.   On: 12/13/2022 20:40    Pending Labs Unresulted Labs (From admission, onward)     Start     Ordered   12/15/22 1201  Basic metabolic panel  Once,   R        12/15/22 0852   12/15/22 1201  Magnesium  Once,   R        12/15/22 0852            Vitals/Pain Today's Vitals   12/15/22 0845 12/15/22 0900 12/15/22 0930 12/15/22 1030  BP: (!) 107/59 (!) 104/43 99/61 (!) 117/59  Pulse: 63  60 60  Resp: 15 (!) 21 16 (!) 21  Temp:      TempSrc:      SpO2: 95%  100% 95%  Weight:      Height:      PainSc:        Isolation Precautions No active isolations  Medications Medications  sodium chloride flush (NS) 0.9 % injection 3 mL (3 mLs Intravenous Given 12/15/22 0938)  acetaminophen (TYLENOL) tablet 650 mg (has no administration in  time range)    Or  acetaminophen (TYLENOL) suppository 650 mg (has no administration in time range)  polyethylene glycol (MIRALAX / GLYCOLAX) packet 17 g (has no administration in time range)  ondansetron (ZOFRAN) tablet 4 mg (has no administration in time range)    Or  ondansetron (ZOFRAN) injection 4 mg (has no administration in time range)  aspirin EC tablet 81 mg (81 mg Oral Given 12/15/22 0936)  nitroGLYCERIN (NITROSTAT) SL tablet 0.4 mg (has no administration in time range)  atorvastatin (LIPITOR) tablet 80 mg (80 mg Oral Given 12/15/22 0935)  mometasone-formoterol (DULERA) 200-5 MCG/ACT inhaler 2 puff (2 puffs Inhalation Given 12/15/22 0929)  fluticasone (FLONASE) 50 MCG/ACT nasal spray 2 spray (2 sprays Each Nare Given 12/15/22 0937)  guaiFENesin-dextromethorphan (ROBITUSSIN DM) 100-10 MG/5ML syrup 10 mL (has no administration in time range)  ipratropium-albuterol (DUONEB) 0.5-2.5 (3) MG/3ML nebulizer solution 3 mL (has no administration in time range)  famotidine (PEPCID) tablet 40 mg (40 mg Oral Given 12/15/22 0936)  ticagrelor (BRILINTA) tablet 90 mg (90 mg Oral Given 12/15/22 0936)  metoprolol succinate (TOPROL-XL) 24 hr tablet 200 mg (200 mg Oral Not Given 12/15/22 0935)  pantoprazole (PROTONIX) injection 40 mg (40 mg Intravenous Given 12/15/22 0937)  lactated ringers infusion ( Intravenous New Bag/Given 12/15/22 0214)  acetaminophen (TYLENOL) tablet 650 mg (650 mg Oral Given  12/15/22 0517)  lidocaine (LIDODERM) 5 % 1 patch (1 patch Transdermal Patch Applied 12/15/22 0937)  morphine (PF) 2 MG/ML injection 2 mg (has no administration in time range)  morphine (PF) 2 MG/ML injection 2 mg (2 mg Intravenous Given 12/14/22 2333)  lactated ringers bolus 500 mL (0 mLs Intravenous Stopped 12/15/22 0034)  alum & mag hydroxide-simeth (MAALOX/MYLANTA) 200-200-20 MG/5ML suspension 30 mL (30 mLs Oral Given 12/15/22 0210)    And  lidocaine (XYLOCAINE) 2 % viscous mouth solution 15 mL (15 mLs Oral Given 12/15/22 0211)   lactated ringers bolus 500 mL (500 mLs Intravenous New Bag/Given 12/15/22 0908)    Mobility walks with person assist     Focused Assessments Cardiac Assessment Handoff:    Lab Results  Component Value Date   CKTOTAL 991 (H) 05/24/2008   CKMB 100.4 (H) 05/24/2008   TROPONINI <0.30 06/20/2014   Lab Results  Component Value Date   DDIMER (H) 05/23/2008    0.65        AT THE INHOUSE ESTABLISHED CUTOFF VALUE OF 0.48 ug/mL FEU, THIS ASSAY HAS BEEN DOCUMENTED IN THE LITERATURE TO HAVE   Does the Patient currently have chest pain? No    R Recommendations: See Admitting Provider Note  Report given to:   Additional Notes: elevated trops

## 2022-12-15 NOTE — Progress Notes (Signed)
Rounding Note    Patient Name: Vincent DemarkGlenn A Knotts Date of Encounter: 12/15/2022  [facility] Cardiologist: Peter SwazilandJordan, MD   Subjective   Pt c/o intermittent CP, like pre-pci pain, started 2 days after the PCI, started after he took meds last pm. Thinks it is GI pain, but also felt like pre-PCI pain.   Has not had GI meds for it. Also c/o SOB.  Inpatient Medications    Scheduled Meds:  acetaminophen  650 mg Oral Q8H   amiodarone  200 mg Oral Daily   amLODipine  10 mg Oral Daily   aspirin EC  81 mg Oral Daily   atorvastatin  80 mg Oral Daily   famotidine  40 mg Oral Daily   fluticasone  2 spray Each Nare Daily   lidocaine  1 patch Transdermal Q24H   metoprolol  200 mg Oral Daily   mometasone-formoterol  2 puff Inhalation BID   pantoprazole (PROTONIX) IV  40 mg Intravenous Q12H   ranolazine  500 mg Oral BID   sacubitril-valsartan  1 tablet Oral BID   sodium chloride flush  3 mL Intravenous Q12H   ticagrelor  90 mg Oral BID   Continuous Infusions:  lactated ringers     lactated ringers 75 mL/hr at 12/15/22 0214   PRN Meds: acetaminophen **OR** acetaminophen, guaiFENesin-dextromethorphan, ipratropium-albuterol, morphine injection, nitroGLYCERIN, ondansetron **OR** ondansetron (ZOFRAN) IV, polyethylene glycol   Vital Signs    Vitals:   12/15/22 0545 12/15/22 0600 12/15/22 0615 12/15/22 0715  BP: (!) 83/52 (!) 104/49 (!) 113/57 (!) 94/56  Pulse: 65 60 (!) 120 63  Resp: 16 14 (!) 26 14  Temp:      TempSrc:      SpO2: 100% 100% 100% 100%  Weight:      Height:        Intake/Output Summary (Last 24 hours) at 12/15/2022 0758 Last data filed at 12/15/2022 0034 Gross per 24 hour  Intake 500 ml  Output --  Net 500 ml      12/14/2022   10:11 PM 12/14/2022    5:00 AM 12/13/2022    5:00 AM  Last 3 Weights  Weight (lbs) 143 lb 4.8 oz 143 lb 1.3 oz 141 lb 1.5 oz  Weight (kg) 65 kg 64.9 kg 64 kg      Telemetry    AV pacing - Personally Reviewed  ECG    AV  pacing w/ HR 60, QRS duration 141 ms  - Personally Reviewed  Physical Exam   GEN: Some acute distress.   Neck:  JVD 10 cm Cardiac: RRR, no murmurs, rubs, or gallops.  Respiratory: rales bases auscultation bilaterally. GI: Soft, + tender, non-distended  MS: No edema; No deformity. Neuro:  Nonfocal  Psych: Normal affect   Labs    High Sensitivity Troponin:   Recent Labs  Lab 12/12/22 1906 12/13/22 0932 12/14/22 2219 12/15/22 0020 12/15/22 0620  TROPONINIHS 2,106* 8,285* 9,381* 8,906* 9,283*     Chemistry Recent Labs  Lab 12/14/22 1014 12/14/22 2219 12/15/22 0020  NA 133* 134* 135  K 5.1 5.6* 5.5*  CL 98 98 102  CO2 21* 21* 19*  GLUCOSE 119* 142* 113*  BUN 23 35* 36*  CREATININE 1.66* 2.47* 2.35*  CALCIUM 9.1 9.2 8.6*  PROT  --  8.4*  --   ALBUMIN  --  4.0  --   AST  --  69*  --   ALT  --  31  --   ALKPHOS  --  88  --   BILITOT  --  1.3*  --   GFRNONAA 42* 26* 27*  ANIONGAP 14 15 14     Lipids  Recent Labs  Lab 12/12/22 0249  CHOL 166  TRIG 120  HDL 48  LDLCALC 94  CHOLHDL 3.5    Hematology Recent Labs  Lab 12/14/22 2219 12/15/22 0020 12/15/22 0725  WBC 17.9* 17.0* 16.8*  RBC 4.27 3.65* 3.58*  HGB 12.0* 10.7* 10.5*  HCT 39.0 33.2* 32.5*  MCV 91.3 91.0 90.8  MCH 28.1 29.3 29.3  MCHC 30.8 32.2 32.3  RDW 14.2 14.1 14.1  PLT 218 182 182   Thyroid No results for input(s): "TSH", "FREET4" in the last 168 hours.  BNP Recent Labs  Lab 12/14/22 2219  BNP 1,131.7*    DDimer No results for input(s): "DDIMER" in the last 168 hours.   Radiology    DG CHEST PORT 1 VIEW  Result Date: 12/14/2022 CLINICAL DATA:  Dyspnea EXAM: PORTABLE CHEST 1 VIEW COMPARISON:  12/13/2022 FINDINGS: Multi lead left-sided pacing device. Cardiomegaly with aortic atherosclerosis. No acute airspace disease, pleural effusion or pneumothorax. IMPRESSION: No active disease. Cardiomegaly. Electronically Signed   By: Jasmine Pang M.D.   On: 12/14/2022 23:14   ECHOCARDIOGRAM  LIMITED  Result Date: 12/14/2022    ECHOCARDIOGRAM LIMITED REPORT   Patient Name:   [name] [facility] Date of Exam: 12/14/2022 Medical Rec #:  [id number]      Height:       68.5 in Accession #:    [id number]     Weight:       143.1 lb Date of Birth:  Apr 18, 1941     BSA:          1.782 m Patient Age:    81 years       BP:           135/62 mmHg Patient Gender: M              HR:           60 bpm. Exam Location:  Inpatient Procedure: Limited Echo and Limited Color Doppler Indications:    Acute myocardial infarction, unspecified I21.9  History:        Patient has prior history of Echocardiogram examinations, most                 recent 12/11/2022. CHF and Cardiomyopathy, Angina and CAD, COPD,                 Arrythmias:LBBB and Bradycardia, Signs/Symptoms:Chest Pain,                 Dyspnea and Syncope; Risk Factors:Hypertension, Sleep Apnea,                 Dyslipidemia and Current Smoker.  Sonographer:    Lucendia Herrlich Referring Phys: [phone] PETER M [place] IMPRESSIONS  1. Left ventricular ejection fraction, by estimation, is 50 to 55%. The left ventricle has low normal function. The left ventricle has no regional wall motion abnormalities. There is moderate concentric left ventricular hypertrophy. Left ventricular diastolic parameters are consistent with Grade I diastolic dysfunction (impaired relaxation). There is dyskinesis of the left ventricular, basal inferior wall. There is akinesis of the left ventricular, basal-mid inferolateral wall.  2. Right ventricular systolic function is normal. The right ventricular size is normal. Tricuspid regurgitation signal is inadequate for assessing PA pressure.  3. The mitral valve is normal in structure. Mild mitral valve regurgitation. No evidence of  mitral stenosis.  4. The aortic valve is tricuspid. Aortic valve regurgitation is not visualized. Aortic valve sclerosis/calcification is present, without any evidence of aortic stenosis.  5. The inferior vena cava is normal in size  with greater than 50% respiratory variability, suggesting right atrial pressure of 3 mmHg. FINDINGS  Left Ventricle: Left ventricular ejection fraction, by estimation, is 50 to 55%. The left ventricle has low normal function. The left ventricle has no regional wall motion abnormalities. The left ventricular internal cavity size was normal in size. There is moderate concentric left ventricular hypertrophy. Left ventricular diastolic parameters are consistent with Grade I diastolic dysfunction (impaired relaxation). Normal left ventricular filling pressure. Right Ventricle: The right ventricular size is normal. No increase in right ventricular wall thickness. Right ventricular systolic function is normal. Tricuspid regurgitation signal is inadequate for assessing PA pressure. Left Atrium: Left atrial size was normal in size. Right Atrium: Right atrial size was normal in size. Pericardium: There is no evidence of pericardial effusion. Mitral Valve: The mitral valve is normal in structure. Mild mitral valve regurgitation. No evidence of mitral valve stenosis. Tricuspid Valve: The tricuspid valve is normal in structure. Tricuspid valve regurgitation is not demonstrated. No evidence of tricuspid stenosis. Aortic Valve: The aortic valve is tricuspid. Aortic valve regurgitation is not visualized. Aortic valve sclerosis/calcification is present, without any evidence of aortic stenosis. Pulmonic Valve: The pulmonic valve was normal in structure. Pulmonic valve regurgitation is not visualized. No evidence of pulmonic stenosis. Aorta: The aortic root is normal in size and structure. Venous: The inferior vena cava is normal in size with greater than 50% respiratory variability, suggesting right atrial pressure of 3 mmHg. IAS/Shunts: No atrial level shunt detected by color flow Doppler. Additional Comments: A device lead is visualized.  LEFT VENTRICLE PLAX 2D LVIDd:         4.00 cm   Diastology LVIDs:         3.20 cm   LV e'  medial:    4.50 cm/s LV PW:         1.60 cm   LV E/e' medial:  14.4 LV IVS:        1.47 cm   LV e' lateral:   5.48 cm/s LVOT diam:     2.40 cm   LV E/e' lateral: 11.8 LVOT Area:     4.52 cm  LEFT ATRIUM         Index LA diam:    2.90 cm 1.63 cm/m   AORTA Ao Root diam: 3.30 cm Ao Asc diam:  3.20 cm MITRAL VALVE MV Area (PHT): 3.85 cm    SHUNTS MV Decel Time: 197 msec    Systemic Diam: 2.40 cm MV E velocity: 64.90 cm/s MV A velocity: 98.80 cm/s MV E/A ratio:  0.66 Armanda Magic MD Electronically signed by Armanda Magic MD Signature Date/Time: 12/14/2022/3:52:15 PM    Final    DG CHEST PORT 1 VIEW  Result Date: 12/13/2022 CLINICAL DATA:  [id number] with chest pain superiorly. EXAM: PORTABLE CHEST 1 VIEW COMPARISON:  CT chest, abdomen and pelvis recently 12/06/2022 FINDINGS: 8:09 p.m. A left chest dual lead pacing system with AID wire is again shown with stable wire insertions. There is mild cardiomegaly. Normal caliber vascular markings. The lungs are mildly emphysematous but clear. There is mild aortic tortuosity with patchy calcific plaques and a stable mediastinal configuration. No pleural effusion is seen or pneumothorax. Regional osseous structures grossly intact. IMPRESSION: No evidence of acute chest disease. Stable COPD chest  with cardiomegaly. Aortic atherosclerosis. Electronically Signed   By: Almira Bar M.D.   On: 12/13/2022 20:40    Cardiac Studies   CARDIAC CATH: 12/11/2021   Prox RCA lesion is 80% stenosed.   Prox RCA to Mid RCA lesion is 75% stenosed.   Ramus lesion is 40% stenosed.   1st Mrg lesion is 100% stenosed.   RPAV lesion is 100% stenosed.   Non-stenotic Prox LAD to Mid LAD lesion was previously treated.   A drug-eluting stent was successfully placed using a SYNERGY XD 3.50X38.   A drug-eluting stent was successfully placed using a STENT ONYX FRONTIER 3.5X38.   Post intervention, there is a 0% residual stenosis.   Post intervention, there is a 0% residual stenosis.   Successful  stenting of the proximal and mid RCA with overlapping 3.5 x 38 mm DES but with resultant no reflow due to poor distal vessel runoff   Plan: Brilinta monotherapy indefinitely due to ASA intolerance. Will monitor in ICU. Check troponins. IV Ntg and analgesics for pain.  Stent  CATHETER LAUNCHER 6FR AL1 guide catheter was inserted. Lesion crossed with guidewire using a WIRE ASAHI PROWATER 180CM. Pre-stent angioplasty was performed using a BALLN EMERGE MR 3.0X20. A drug-eluting stent was successfully placed using a STENT ONYX FRONTIER 3.5X38. Maximum pressure: 14 atm. Stent strut is well apposed. Stent overlaps previously placed stent. Post-stent angioplasty was not performed.  Post-Intervention Lesion Assessment  The intervention was successful. Pre-interventional TIMI flow is 1. Post-intervention TIMI flow is 3. No-reflow occurred during the intervention. Ultrasound (IVUS) was performed on the lesion post PCI. Stent well apposed.  There is a 0% residual stenosis post intervention.  Prox RCA to Mid RCA lesion  Stent  CATHETER LAUNCHER 6FR AL1 guide catheter was inserted. Lesion crossed with guidewire using a WIRE ASAHI PROWATER 180CM. Pre-stent angioplasty was performed using a BALLN EMERGE MR 2.5X15. A drug-eluting stent was successfully placed using a SYNERGY XD 3.50X38. Maximum pressure: 14 atm. Stent strut is well apposed. Post-stent angioplasty was not performed.  Post-Intervention Lesion Assessment  The intervention was successful. Pre-interventional TIMI flow is 3. Post-intervention TIMI flow is 1. No-reflow occurred during the intervention. Ultrasound (IVUS) was performed on the lesion post PCI. Stent well apposed.  There is a 0% residual stenosis post intervention.  Intervention    ECHO: 12/13/2021  1. Left ventricular ejection fraction, by estimation, is 50 to 55%. The  left ventricle has low normal function. The left ventricle has no regional  wall motion abnormalities. There is moderate  concentric left ventricular  hypertrophy. Left ventricular diastolic parameters are consistent with Grade I diastolic dysfunction (impaired relaxation). There is dyskinesis of the left ventricular, basal inferior wall. There is akinesis of the left ventricular, basal-mid  inferolateral wall.   2. Right ventricular systolic function is normal. The right ventricular  size is normal. Tricuspid regurgitation signal is inadequate for assessing  PA pressure.   3. The mitral valve is normal in structure. Mild mitral valve  regurgitation. No evidence of mitral stenosis.   4. The aortic valve is tricuspid. Aortic valve regurgitation is not  visualized. Aortic valve sclerosis/calcification is present, without any  evidence of aortic stenosis.   5. The inferior vena cava is normal in size with greater than 50%  respiratory variability, suggesting right atrial pressure of 3 mmHg.   Patient Profile     82 y.o. 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   Patient saw Dr. Elberta Fortis 08/2022 after a VT episode treated with ATP. Patient was asymptomatic. Labs stable, no med changes, rec AAD if VT continued. Device clinic alerted in March for 4 VF episodes, all converted with ATP x 1, and >100 NSVT episodes. It was recommended at that time for patient to begin amiodarone via telephone notes. Patient again seen on 3/26 by Dr. Elberta Fortis for increased VT requiring ATP therapy. Since December, patient also has had increased cramping in central chest as well as worsening exertional dyspnea and dizziness/pre-syncope. Nitroglycerin does relieve these symptoms. Chest discomfort and dyspnea do not necessarily occur simultaneously. Given these symptoms, decision made to pursue outpatient LHC.   Admitted 04/01-04/05 for cath had DES LAD 04/01, DES x 2 RCA 04/03  Cath results above, no reflow post PCI RCA due to distal embolization >> med rx.  EF  initially 30-35% >> 50-55% by d/c. Entresto held due to AKI. Pt reluctant to take meds.    Assessment & Plan    NSTEMI - trop initially elevated 04/03 after PCI 3161 - after 2nd PCI, trop > 9000 - w/ recurrent pain, will get stat echo, interrogate device.  - reluctant to cath since Cr up and pain may be due to RCA territory where med rx is only option. - also possibly GI in origin, try GI cocktail and Protonix - also possibly Dressler's, no rub on exam.  - discuss plan w/ MD. - hold meds that affect Cr, get PA pressure on echo as he is wet by exam and BNP elevated, but CXR ok and Cr high enough to cause concern.   For questions or updates, please contact [facility] Please consult [email or web address] for contact info under        Signed, Theodore Demark, PA-C  12/15/2022, 7:58 AM  \

## 2022-12-15 NOTE — ED Notes (Signed)
Report called to floor nurse, cath lab attempt ed to be called, no answer
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
## 2022-12-15 NOTE — Progress Notes (Signed)
ANTICOAGULATION CONSULT NOTE - Initial Consult  Pharmacy Consult for heparin Indication: chest pain/ACS  Allergies  Allergen Reactions   Acyclovir And Related     GI upset    Aspirin Other (See Comments)    GI upset at high doses only.    Patient Measurements: Height: 5\' 8"  (172.7 cm) Weight: 65 kg (143 lb 4.8 oz) IBW/kg (Calculated) : 68.4 Heparin Dosing Weight: TBW  Vital Signs: Temp: 97.9 F (36.6 C) (04/06 0844) Temp Source: Oral (04/06 0844) BP: 117/59 (04/06 1030) Pulse Rate: 60 (04/06 1030)  Labs: Recent Labs    12/14/22 1014 12/14/22 2219 12/15/22 0020 12/15/22 0620 12/15/22 0725 12/15/22 0944  HGB  --  12.0* 10.7*  --  10.5*  --   HCT  --  39.0 33.2*  --  32.5*  --   PLT  --  218 182  --  182  --   CREATININE 1.66* 2.47* 2.35*  --   --   --   TROPONINIHS  --  4,142* 8,906* 9,283*  --  6,685*    Estimated Creatinine Clearance: 23.4 mL/min (A) (by C-G formula based on SCr of 2.35 mg/dL (H)).   Medical History: Past Medical History:  Diagnosis Date   Acute renal failure 05/31/2014   Angina decubitus 06/15/2014   CAD (coronary artery disease)    a. s/p stent RCA DES 9/09; b. 2014 Attempted PCI of OM1 @ [place];  c. 04/2014 Cath: LAD 40-50p, D1 95-99 (chronic), LCX 30-40 inf branch, OM1 CTO, RCA 30-40p, RCA patent stent, EF 35%->Med Rx.   Chronic combined systolic and diastolic CHF (congestive heart failure)    a. EF about 40 to 45% per echo in April 2013;  b. 04/2014 Echo: EF 20-25%, sev LVH, sev glob HK, Gr 1 DD, mildly reduced RV fxn, PASP . c. 01/2017: EF improved to 50-55%.    Chronic low back pain    Complete heart block    COPD (chronic obstructive pulmonary disease) dx 06/2013   PFTs 07/08/13: mod obst with resp to bronchodilator, moderate decrease diffusion, airtrapping   COPD with acute exacerbation 01/17/2017   COPD with asthma    07/08/13 PFT: FEV1 1.74L (66% pred, 30% change with BD), mod obst with resp to bronchodilator, moderate  decrease diffusion, air-trapping 11/2013 Simple spiro>> clear obstruction, FEV1 1.30 L (47% pred) - trial of symbicort 160 2bid 01/26/15      Depression 09/15/2018   Assessment: Increased sadness and depression since losing job  Plan: Patient denies suicidal and homicidal ideations Patient would not like to start medication therapies at this time Will establish patient with [facility] for primary care   Dyslipidemia    a. on statin   Essential hypertension    HLD (hyperlipidemia)    HTN (hypertension)    a. Reports intolerance to hydralazine; b. no beta blockers 2/2 bradycardia;  c. failed on ACE and ARB.   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 04/2014 Echo: EF 20-25%, sev glob HK.   Mixed Ischemic/Non-Ischemic Cardiomyopathy    probable mixed ischemic and non-ischemic    Noncompliance    OSA (obstructive sleep apnea) 04/18/2018   06/11/16 - home sleep study shows AHI of 2.9 an hour with the lowest SaO2 of 79% with an average of 93%  03/08/2018-Home sleep study-AHI 7/HR, SaO2 low 81%  Paroxysmal atrial fibrillation    a. identified on device interrogation 01/2016   Pneumonia 09/02/2013   Pulmonary edema 05/10/2014   Respiratory arrest 05/18/2014   Sinus bradycardia 05/31/2014   Ventricular tachycardia    treated with ATP,  CL 250-300 msec     Assessment: 81 YOM presenting with CP, recent PCI, on DAPT PTA, no AC.    Goal of Therapy:  Heparin level 0.3-0.7 units/ml Monitor platelets by anticoagulation protocol: Yes   Plan:  Heparin 4000 units IV x 1, and gtt at 800 units/hr F/u 8 hour heparin level F/u cards plan and recs  Daylene Posey, PharmD, [facility] Clinical Pharmacist ED Pharmacist Phone # [phone] 12/15/2022 12:00 PM

## 2022-12-15 NOTE — ED Notes (Addendum)
Pt systolic BP down, 90-80's. maintenance IVF in progress, pt c/o chest pain, repeat EKG down. Message sent to  Alvino Chapel MD with update

## 2022-12-15 NOTE — ED Notes (Signed)
ED TO INPATIENT HANDOFF REPORT  ED Nurse Name and Phone #: [phone]  S Name/Age/Gender Clent DemarkGlenn A Dumont 82 y.o. male Room/Bed: 004C/004C  Code Status   Code Status: Full Code  Home/SNF/Other Home Patient oriented to: self, place, and time Is this baseline? Yes   Triage Complete: Triage complete  Chief Complaint Abdominal pain [R10.9] Chest pain [R07.9]  Triage Note No notes on file   Allergies Allergies  Allergen Reactions   Acyclovir And Related     GI upset    Aspirin Other (See Comments)    GI upset at high doses only.    Level of Care/Admitting Diagnosis ED Disposition     ED Disposition  Admit   Condition  --   Comment  [facility] [[id number]]  Level of Care: Telemetry Cardiac [[id number]]  [facility] or [facility] if equivalent level of care is available:: No  Covid Evaluation: Confirmed COVID Negative  Diagnosis: Chest pain [161096][[id number]]  Admitting Physician: Rachael FeeZU, CRYSTAL [[id number]]  Attending Physician: Lewayne BuntingRENSHAW, BRIAN S [[id number]]  Certification:: I certify this patient will need inpatient services for at least 2 midnights          B Medical/Surgery History Past Medical History:  Diagnosis Date   Acute renal failure 05/31/2014   Angina decubitus 06/15/2014   CAD (coronary artery disease)    a. s/p stent RCA DES 9/09; b. 2014 Attempted PCI of OM1 @ [place];  c. 04/2014 Cath: LAD 40-50p, D1 95-99 (chronic), LCX 30-40 inf branch, OM1 CTO, RCA 30-40p, RCA patent stent, EF 35%->Med Rx.   Chronic combined systolic and diastolic CHF (congestive heart failure)    a. EF about 40 to 45% per echo in April 2013;  b. 04/2014 Echo: EF 20-25%, sev LVH, sev glob HK, Gr 1 DD, mildly reduced RV fxn, PASP 38mmHg. c. 01/2017: EF improved to 50-55%.    Chronic low back pain    Complete heart block    COPD (chronic obstructive pulmonary disease) dx 06/2013   PFTs 07/08/13: mod obst with resp to bronchodilator, moderate decrease  diffusion, airtrapping   COPD with acute exacerbation 01/17/2017   COPD with asthma    07/08/13 PFT: FEV1 1.74L (66% pred, 30% change with BD), mod obst with resp to bronchodilator, moderate decrease diffusion, air-trapping 11/2013 Simple spiro>> clear obstruction, FEV1 1.30 L (47% pred) - trial of symbicort 160 2bid 01/26/15      Depression 09/15/2018   Assessment: Increased sadness and depression since losing job  Plan: Patient denies suicidal and homicidal ideations Patient would not like to start medication therapies at this time Will establish patient with [facility] for primary care   Dyslipidemia    a. on statin   Essential hypertension    HLD (hyperlipidemia)    HTN (hypertension)    a. Reports intolerance to hydralazine; b. no beta blockers 2/2 bradycardia;  c. failed on ACE and ARB.   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 04/2014 Echo: EF 20-25%, sev glob HK.   Mixed Ischemic/Non-Ischemic Cardiomyopathy    probable mixed ischemic and non-ischemic    Noncompliance    OSA (obstructive sleep apnea) 04/18/2018   06/11/16 - home sleep study shows AHI of 2.9 an hour with the lowest SaO2 of 79% with an average of 93%  03/08/2018-Home sleep study-AHI 7/HR, SaO2 low 81%    Paroxysmal atrial fibrillation    a. identified on device interrogation 01/2016   Pneumonia 09/02/2013   Pulmonary edema 05/10/2014   Respiratory arrest 05/18/2014   Sinus bradycardia 05/31/2014   Ventricular tachycardia    treated with ATP,  CL 250-300 msec   Past Surgical History:  Procedure Laterality Date   BI-VENTRICULAR IMPLANTABLE CARDIOVERTER DEFIBRILLATOR N/A 08/12/2014   MDT Ovidio Kin XT CRTD implanted by Dr Johney Frame   BIV ICD GENERATOR CHANGEOUT N/A 08/08/2021   Procedure: BIV ICD GENERATOR CHANGEOUT;  Surgeon: Hillis Range, MD;   Location: [facility] INVASIVE CV LAB;  Service: Cardiovascular;  Laterality: N/A;   CARDIAC CATHETERIZATION     ejection fraction 50%   CORONARY STENT INTERVENTION N/A 12/10/2022   Procedure: CORONARY STENT INTERVENTION;  Surgeon: Swaziland, Peter M, MD;  Location: [facility] INVASIVE CV LAB;  Service: Cardiovascular;  Laterality: N/A;   CORONARY STENT INTERVENTION N/A 12/12/2022   Procedure: CORONARY STENT INTERVENTION;  Surgeon: Swaziland, Peter M, MD;  Location: [facility] INVASIVE CV LAB;  Service: Cardiovascular;  Laterality: N/A;   CORONARY ULTRASOUND/IVUS N/A 12/10/2022   Procedure: Intravascular Ultrasound/IVUS;  Surgeon: Swaziland, Peter M, MD;  Location: [facility] INVASIVE CV LAB;  Service: Cardiovascular;  Laterality: N/A;   CORONARY ULTRASOUND/IVUS N/A 12/12/2022   Procedure: Coronary Ultrasound/IVUS;  Surgeon: Swaziland, Peter M, MD;  Location: [facility] INVASIVE CV LAB;  Service: Cardiovascular;  Laterality: N/A;   LEFT HEART CATH AND CORONARY ANGIOGRAPHY N/A 12/10/2022   Procedure: LEFT HEART CATH AND CORONARY ANGIOGRAPHY;  Surgeon: Swaziland, Peter M, MD;  Location: [facility] INVASIVE CV LAB;  Service: Cardiovascular;  Laterality: N/A;   LEFT HEART CATHETERIZATION WITH CORONARY ANGIOGRAM N/A 05/08/2014   Procedure: LEFT HEART CATHETERIZATION WITH CORONARY ANGIOGRAM;  Surgeon: Micheline Chapman, MD;  Location[facility] CATH LAB;  Service: Cardiovascular;  Laterality: N/A;     A IV Location/Drains/Wounds Patient Lines/Drains/Airways Status     Active Line/Drains/Airways     Name Placement date Placement time Site Days   Peripheral IV 12/14/22 20 G Anterior;Right Forearm 12/14/22  2307  Forearm  1   Peripheral IV 12/15/22 Anterior;Left;Proximal Forearm 12/15/22  1256  Forearm  less than 1            Intake/Output Last 24 hours  Intake/Output Summary (Last 24 hours) at 12/15/2022 1310 Last data filed at 12/15/2022 0034 Gross per 24 hour  Intake 500 ml  Output --  Net 500 ml    Labs/Imaging Results for orders placed or performed during the  hospital encounter of 12/14/22 (from the past 48 hour(s))  Troponin I (High Sensitivity)     Status: Abnormal   Collection Time: 12/14/22 10:19 PM  Result Value Ref Range   Troponin I (High Sensitivity) 9,381 (HH) <18 ng/L    Comment: CORRECTED RESULTS CALLED TO: CRITICAL RESULT CALLED TO, READ BACK BY AND VERIFIED WITH: J,Goro Wenrick RN @1106  12/15/22 E,BENTON (NOTE) Elevated high sensitivity troponin I (hsTnI) values and significant  changes across serial measurements may suggest ACS but many other  chronic and acute conditions are known to elevate hsTnI results.  Refer to the Links section for chest pain algorithms and additional  guidance. Performed at [facility] Lab, [address], [place], [place] [address] CORRECTED ON 04/06 AT 1109: PREVIOUSLY REPORTED AS 9381 CRITICAL VALUE NOTED. VALUE IS CONSISTENT WITH PREVIOUSLY REPORTED/CALLED VALUE   Brain natriuretic peptide     Status: Abnormal   Collection Time: 12/14/22 10:19 PM  Result  Value Ref Range   B Natriuretic Peptide 1,131.7 (H) 0.0 - 100.0 pg/mL    Comment: Performed at [facility] Lab, [address], [place], [place] [address]  CBC with Differential     Status: Abnormal   Collection Time: 12/14/22 10:19 PM  Result Value Ref Range   WBC 17.9 (H) 4.0 - 10.5 K/uL   RBC 4.27 4.22 - 5.81 MIL/uL   Hemoglobin 12.0 (L) 13.0 - 17.0 g/dL   HCT 88.8 28.0 - 03.4 %   MCV 91.3 80.0 - 100.0 fL   MCH 28.1 26.0 - 34.0 pg   MCHC 30.8 30.0 - 36.0 g/dL   RDW 91.7 91.5 - 05.6 %   Platelets 218 150 - 400 K/uL   nRBC 0.0 0.0 - 0.2 %   Neutrophils Relative % 85 %   Neutro Abs 15.2 (H) 1.7 - 7.7 K/uL   Lymphocytes Relative 6 %   Lymphs Abs 1.1 0.7 - 4.0 K/uL   Monocytes Relative 8 %   Monocytes Absolute 1.5 (H) 0.1 - 1.0 K/uL   Eosinophils Relative 0 %   Eosinophils Absolute 0.0 0.0 - 0.5 K/uL   Basophils Relative 0 %   Basophils Absolute 0.0 0.0 - 0.1 K/uL   Immature Granulocytes 1 %   Abs Immature Granulocytes 0.09 (H) 0.00  - 0.07 K/uL    Comment: Performed at [facility] Lab, [address] [place], [place] [address]  Comprehensive metabolic panel     Status: Abnormal   Collection Time: 12/14/22 10:19 PM  Result Value Ref Range   Sodium 134 (L) 135 - 145 mmol/L   Potassium 5.6 (H) 3.5 - 5.1 mmol/L   Chloride 98 98 - 111 mmol/L   CO2 21 (L) 22 - 32 mmol/L   Glucose, Bld 142 (H) 70 - 99 mg/dL    Comment: Glucose reference range applies only to samples taken after fasting for at least 8 hours.   BUN 35 (H) 8 - 23 mg/dL   Creatinine, Ser 0.16 (H) 0.61 - 1.24 mg/dL   Calcium 9.2 8.9 - 55.3 mg/dL   Total Protein 8.4 (H) 6.5 - 8.1 g/dL   Albumin 4.0 3.5 - 5.0 g/dL   AST 69 (H) 15 - 41 U/L   ALT 31 0 - 44 U/L   Alkaline Phosphatase 88 38 - 126 U/L   Total Bilirubin 1.3 (H) 0.3 - 1.2 mg/dL   GFR, Estimated 26 (L) >60 mL/min    Comment: (NOTE) Calculated using the CKD-EPI Creatinine Equation (2021)    Anion gap 15 5 - 15    Comment: Performed at [facility] Lab, [address], [place], [place] [address]  Troponin I (High Sensitivity)     Status: Abnormal   Collection Time: 12/15/22 12:20 AM  Result Value Ref Range   Troponin I (High Sensitivity) 8,906 (HH) <18 ng/L    Comment: CRITICAL VALUE NOTED. VALUE IS CONSISTENT WITH PREVIOUSLY REPORTED/CALLED VALUE (NOTE) Elevated high sensitivity troponin I (hsTnI) values and significant  changes across serial measurements may suggest ACS but many other  chronic and acute conditions are known to elevate hsTnI results.  Refer to the "Links" section for chest pain algorithms and additional  guidance. Performed at [facility] Lab, [address], [place], [place] [address]   CBC     Status: Abnormal   Collection Time: 12/15/22 12:20 AM  Result Value Ref Range   WBC 17.0 (H) 4.0 - 10.5 K/uL   RBC  3.65 (L) 4.22 - 5.81 MIL/uL   Hemoglobin 10.7 (L) 13.0 - 17.0 g/dL   HCT 16.1 (L) 09.6 - 04.5 %   MCV 91.0 80.0 - 100.0 fL   MCH 29.3 26.0 - 34.0 pg    MCHC 32.2 30.0 - 36.0 g/dL   RDW 40.9 81.1 - 91.4 %   Platelets 182 150 - 400 K/uL   nRBC 0.0 0.0 - 0.2 %    Comment: Performed at [facility] Lab, [address] [place], [place] [address]  Basic metabolic panel     Status: Abnormal   Collection Time: 12/15/22 12:20 AM  Result Value Ref Range   Sodium 135 135 - 145 mmol/L   Potassium 5.5 (H) 3.5 - 5.1 mmol/L   Chloride 102 98 - 111 mmol/L   CO2 19 (L) 22 - 32 mmol/L   Glucose, Bld 113 (H) 70 - 99 mg/dL    Comment: Glucose reference range applies only to samples taken after fasting for at least 8 hours.   BUN 36 (H) 8 - 23 mg/dL   Creatinine, Ser 6.21 (H) 0.61 - 1.24 mg/dL   Calcium 8.6 (L) 8.9 - 10.3 mg/dL   GFR, Estimated 27 (L) >60 mL/min    Comment: (NOTE) Calculated using the CKD-EPI Creatinine Equation (2021)    Anion gap 14 5 - 15    Comment: Performed at [facility] Lab, [address] [place], [place] [address]  Troponin I (High Sensitivity)     Status: Abnormal   Collection Time: 12/15/22  6:20 AM  Result Value Ref Range   Troponin I (High Sensitivity) 9,283 (HH) <18 ng/L    Comment: CRITICAL VALUE NOTED. VALUE IS CONSISTENT WITH PREVIOUSLY REPORTED/CALLED VALUE (NOTE) Elevated high sensitivity troponin I (hsTnI) values and significant  changes across serial measurements may suggest ACS but many other  chronic and acute conditions are known to elevate hsTnI results.  Refer to the "Links" section for chest pain algorithms and additional  guidance. Performed at [facility] Lab, [address] [place], [place] [address]   CBC     Status: Abnormal   Collection Time: 12/15/22  7:25 AM  Result Value Ref Range   WBC 16.8 (H) 4.0 - 10.5 K/uL   RBC 3.58 (L) 4.22 - 5.81 MIL/uL   Hemoglobin 10.5 (L) 13.0 - 17.0 g/dL   HCT 62.9 (L) 52.8 - 41.3 %   MCV 90.8 80.0 - 100.0 fL   MCH 29.3 26.0 - 34.0 pg   MCHC 32.3 30.0 - 36.0 g/dL   RDW 24.4 01.0 - 27.2 %   Platelets 182 150 - 400 K/uL   nRBC 0.0 0.0 - 0.2 %     Comment: Performed at [facility] Lab, [address], [place], [place] [address]  Troponin I (High Sensitivity)     Status: Abnormal   Collection Time: 12/15/22  9:44 AM  Result Value Ref Range   Troponin I (High Sensitivity) 6,685 (HH) <18 ng/L    Comment: CRITICAL RESULT CALLED TO, READ BACK BY AND VERIFIED WITH J,Kiarah Eckstein RN  12/15/22 E,BENTON (NOTE) Elevated high sensitivity troponin I (hsTnI) values and significant  changes across serial measurements may suggest ACS but many other  chronic and acute conditions are known to elevate hsTnI results.  Refer to the "Links" section for chest pain algorithms and additional  guidance. Performed at [facility] Lab, [address] [place], [place] [address]   Basic metabolic panel  Status: Abnormal   Collection Time: 12/15/22 11:58 AM  Result Value Ref Range   Sodium 134 (L) 135 - 145 mmol/L   Potassium 4.7 3.5 - 5.1 mmol/L   Chloride 103 98 - 111 mmol/L   CO2 15 (L) 22 - 32 mmol/L   Glucose, Bld 92 70 - 99 mg/dL    Comment: Glucose reference range applies only to samples taken after fasting for at least 8 hours.   BUN 40 (H) 8 - 23 mg/dL   Creatinine, Ser 1.61 (H) 0.61 - 1.24 mg/dL   Calcium 7.8 (L) 8.9 - 10.3 mg/dL   GFR, Estimated 25 (L) >60 mL/min    Comment: (NOTE) Calculated using the CKD-EPI Creatinine Equation (2021)    Anion gap 16 (H) 5 - 15    Comment: Performed at [facility] Lab, [address] [place], [place] [address]  Magnesium     Status: None   Collection Time: 12/15/22 11:58 AM  Result Value Ref Range   Magnesium 1.9 1.7 - 2.4 mg/dL    Comment: Performed at [facility] Lab, [address] [place], [place] [address]   ECHOCARDIOGRAM LIMITED  Result Date: 12/15/2022    ECHOCARDIOGRAM LIMITED REPORT   Patient Name:   JAIVYN GULLA [facility] Date of Exam: 12/15/2022 Medical Rec #:  [id number]      Height:       68.0 in Accession #:    [id number]     Weight:       143.3 lb Date of Birth:  1941/08/27      BSA:          1.774 m Patient Age:    81 years       BP:           107/59 mmHg Patient Gender: M              HR:           62 bpm. Exam Location:  Inpatient Procedure: Limited Echo, Limited Color Doppler and Cardiac Doppler STAT ECHO Indications:    chest pain  History:        Patient has prior history of Echocardiogram examinations, most                 recent 12/14/2022. Cardiomyopathy, CAD, Defibrillator, COPD,                 Arrythmias:Atrial Fibrillation and LBBB; Risk Factors:Sleep                 Apnea, Hypertension, Dyslipidemia and Former Smoker.  Sonographer:    Delcie Roch RDCS Referring Phys: [id number] RHONDA G BARRETT IMPRESSIONS  1. Limited study to assess LV function; hypokinesis of the inferior, inferolateral wall with moderate LV dysfunction.  2. Left ventricular ejection fraction, by estimation, is 35 to 40%. The left ventricle has moderately decreased function. The left ventricle demonstrates regional wall motion abnormalities (see scoring diagram/findings for description). There is moderate left ventricular hypertrophy.  3. Right ventricular systolic function is severely reduced. The right ventricular size is normal. There is normal pulmonary artery systolic pressure.  4. The mitral valve is normal in structure. Mild mitral valve regurgitation. No evidence of mitral stenosis.  5. The aortic valve is tricuspid. Aortic valve regurgitation is trivial. Aortic valve sclerosis is present, with no evidence of aortic valve stenosis.  6. The inferior vena cava is normal in size with greater than 50% respiratory variability, suggesting right atrial pressure of 3 mmHg.  FINDINGS  Left Ventricle: Left ventricular ejection fraction, by estimation, is 35 to 40%. The left ventricle has moderately decreased function. The left ventricle demonstrates regional wall motion abnormalities. The left ventricular internal cavity size was normal in size. There is moderate left ventricular hypertrophy. Right Ventricle:  The right ventricular size is normal. Right ventricular systolic function is severely reduced. There is normal pulmonary artery systolic pressure. The tricuspid regurgitant velocity is 1.86 m/s, and with an assumed right atrial pressure of 3 mmHg, the estimated right ventricular systolic pressure is 16.8 mmHg. Left Atrium: Left atrial size was normal in size. Right Atrium: Right atrial size was normal in size. Pericardium: There is no evidence of pericardial effusion. Mitral Valve: The mitral valve is normal in structure. Mild mitral valve regurgitation. No evidence of mitral valve stenosis. Tricuspid Valve: The tricuspid valve is normal in structure. Tricuspid valve regurgitation is mild . No evidence of tricuspid stenosis. Aortic Valve: The aortic valve is tricuspid. Aortic valve regurgitation is trivial. Aortic valve sclerosis is present, with no evidence of aortic valve stenosis. Pulmonic Valve: The pulmonic valve was normal in structure. Pulmonic valve regurgitation is not visualized. Aorta: The aortic root is normal in size and structure. Venous: The inferior vena cava is normal in size with greater than 50% respiratory variability, suggesting right atrial pressure of 3 mmHg. Additional Comments: Limited study to assess LV function; hypokinesis of the inferior, inferolateral wall with moderate LV dysfunction. A device lead is visualized. Spectral Doppler performed. Color Doppler performed.  LEFT VENTRICLE PLAX 2D LVIDd:         4.40 cm LVIDs:         3.10 cm LV PW:         1.40 cm LV IVS:        1.50 cm  LV Volumes (MOD) LV vol d, MOD A2C: 98.2 ml LV vol d, MOD A4C: 85.8 ml LV vol s, MOD A2C: 63.1 ml LV vol s, MOD A4C: 56.6 ml LV SV MOD A2C:     35.1 ml LV SV MOD A4C:     85.8 ml LV SV MOD BP:      32.6 ml IVC IVC diam: 1.90 cm LEFT ATRIUM         Index LA diam:    3.20 cm 1.80 cm/m  AORTIC VALVE LVOT Vmax:   89.30 cm/s LVOT Vmean:  52.800 cm/s LVOT VTI:    0.167 m MITRAL VALVE               TRICUSPID VALVE  MV Area (PHT): 3.53 cm    TR Peak grad:   13.8 mmHg MV Decel Time: 215 msec    TR Vmax:        186.00 cm/s MR Peak grad: 98.0 mmHg MR Mean grad: 59.0 mmHg    SHUNTS MR Vmax:      495.00 cm/s  Systemic VTI: 0.17 m MR Vmean:     351.0 cm/s MV E velocity: 65.60 cm/s MV A velocity: 87.30 cm/s MV E/A ratio:  0.75 Olga Millers MD Electronically signed by Olga Millers MD Signature Date/Time: 12/15/2022/10:18:38 AM    Final    DG CHEST PORT 1 VIEW  Result Date: 12/14/2022 CLINICAL DATA:  Dyspnea EXAM: PORTABLE CHEST 1 VIEW COMPARISON:  12/13/2022 FINDINGS: Multi lead left-sided pacing device. Cardiomegaly with aortic atherosclerosis. No acute airspace disease, pleural effusion or pneumothorax. IMPRESSION: No active disease. Cardiomegaly. Electronically Signed   By: Jasmine Pang M.D.   On: 12/14/2022 23:14  ECHOCARDIOGRAM LIMITED  Result Date: 12/14/2022    ECHOCARDIOGRAM LIMITED REPORT   Patient Name:   THEOTIS GERDEMAN [facility] Date of Exam: 12/14/2022 Medical Rec #:  [id number]      Height:       68.5 in Accession #:    [id number]     Weight:       143.1 lb Date of Birth:  [birth year]     BSA:          1.782 m Patient Age:    81 years       BP:           135/62 mmHg Patient Gender: M              HR:           60 bpm. Exam Location:  Inpatient Procedure: Limited Echo and Limited Color Doppler Indications:    Acute myocardial infarction, unspecified I21.9  History:        Patient has prior history of Echocardiogram examinations, most                 recent 12/11/2022. CHF and Cardiomyopathy, Angina and CAD, COPD,                 Arrythmias:LBBB and Bradycardia, Signs/Symptoms:Chest Pain,                 Dyspnea and Syncope; Risk Factors:Hypertension, Sleep Apnea,                 Dyslipidemia and Current Smoker.  Sonographer:    Lucendia Herrlich Referring Phys: [phone] PETER M [place] IMPRESSIONS  1. Left ventricular ejection fraction, by estimation, is 50 to 55%. The left ventricle has low normal function. The left ventricle has no  regional wall motion abnormalities. There is moderate concentric left ventricular hypertrophy. Left ventricular diastolic parameters are consistent with Grade I diastolic dysfunction (impaired relaxation). There is dyskinesis of the left ventricular, basal inferior wall. There is akinesis of the left ventricular, basal-mid inferolateral wall.  2. Right ventricular systolic function is normal. The right ventricular size is normal. Tricuspid regurgitation signal is inadequate for assessing PA pressure.  3. The mitral valve is normal in structure. Mild mitral valve regurgitation. No evidence of mitral stenosis.  4. The aortic valve is tricuspid. Aortic valve regurgitation is not visualized. Aortic valve sclerosis/calcification is present, without any evidence of aortic stenosis.  5. The inferior vena cava is normal in size with greater than 50% respiratory variability, suggesting right atrial pressure of 3 mmHg. FINDINGS  Left Ventricle: Left ventricular ejection fraction, by estimation, is 50 to 55%. The left ventricle has low normal function. The left ventricle has no regional wall motion abnormalities. The left ventricular internal cavity size was normal in size. There is moderate concentric left ventricular hypertrophy. Left ventricular diastolic parameters are consistent with Grade I diastolic dysfunction (impaired relaxation). Normal left ventricular filling pressure. Right Ventricle: The right ventricular size is normal. No increase in right ventricular wall thickness. Right ventricular systolic function is normal. Tricuspid regurgitation signal is inadequate for assessing PA pressure. Left Atrium: Left atrial size was normal in size. Right Atrium: Right atrial size was normal in size. Pericardium: There is no evidence of pericardial effusion. Mitral Valve: The mitral valve is normal in structure. Mild mitral valve regurgitation. No evidence of mitral valve stenosis. Tricuspid Valve: The tricuspid valve is  normal in structure. Tricuspid valve regurgitation is not demonstrated. No evidence of tricuspid stenosis. Aortic Valve: The  aortic valve is tricuspid. Aortic valve regurgitation is not visualized. Aortic valve sclerosis/calcification is present, without any evidence of aortic stenosis. Pulmonic Valve: The pulmonic valve was normal in structure. Pulmonic valve regurgitation is not visualized. No evidence of pulmonic stenosis. Aorta: The aortic root is normal in size and structure. Venous: The inferior vena cava is normal in size with greater than 50% respiratory variability, suggesting right atrial pressure of 3 mmHg. IAS/Shunts: No atrial level shunt detected by color flow Doppler. Additional Comments: A device lead is visualized.  LEFT VENTRICLE PLAX 2D LVIDd:         4.00 cm   Diastology LVIDs:         3.20 cm   LV e' medial:    4.50 cm/s LV PW:         1.60 cm   LV E/e' medial:  14.4 LV IVS:        1.47 cm   LV e' lateral:   5.48 cm/s LVOT diam:     2.40 cm   LV E/e' lateral: 11.8 LVOT Area:     4.52 cm  LEFT ATRIUM         Index LA diam:    2.90 cm 1.63 cm/m   AORTA Ao Root diam: 3.30 cm Ao Asc diam:  3.20 cm MITRAL VALVE MV Area (PHT): 3.85 cm    SHUNTS MV Decel Time: 197 msec    Systemic Diam: 2.40 cm MV E velocity: 64.90 cm/s MV A velocity: 98.80 cm/s MV E/A ratio:  0.66 Armanda Magic MD Electronically signed by Armanda Magic MD Signature Date/Time: 12/14/2022/3:52:15 PM    Final    DG CHEST PORT 1 VIEW  Result Date: 12/13/2022 CLINICAL DATA:  [id number] with chest pain superiorly. EXAM: PORTABLE CHEST 1 VIEW COMPARISON:  CT chest, abdomen and pelvis recently 12/06/2022 FINDINGS: 8:09 p.m. A left chest dual lead pacing system with AID wire is again shown with stable wire insertions. There is mild cardiomegaly. Normal caliber vascular markings. The lungs are mildly emphysematous but clear. There is mild aortic tortuosity with patchy calcific plaques and a stable mediastinal configuration. No pleural effusion is  seen or pneumothorax. Regional osseous structures grossly intact. IMPRESSION: No evidence of acute chest disease. Stable COPD chest with cardiomegaly. Aortic atherosclerosis. Electronically Signed   By: Almira Bar M.D.   On: 12/13/2022 20:40    Pending Labs Unresulted Labs (From admission, onward)     Start     Ordered   12/16/22 0500  Heparin level (unfractionated)  Daily,   R     See Hyperspace for full Linked Orders Report.   12/15/22 1201   12/16/22 0500  CBC  Daily,   R     See Hyperspace for full Linked Orders Report.   12/15/22 1201   12/15/22 2000  Heparin level (unfractionated)  Once-Timed,   TIMED        12/15/22 1201            Vitals/Pain Today's Vitals   12/15/22 0900 12/15/22 0930 12/15/22 1030 12/15/22 1233  BP: (!) 104/43 99/61 (!) 117/59   Pulse:  60 60   Resp: (!) 21 16 (!) 21   Temp:    97.7 F (36.5 C)  TempSrc:    Oral  SpO2:  100% 95%   Weight:      Height:      PainSc:        Isolation Precautions No active isolations  Medications Medications  sodium chloride  flush (NS) 0.9 % injection 3 mL (3 mLs Intravenous Given 12/15/22 0938)  acetaminophen (TYLENOL) tablet 650 mg (has no administration in time range)    Or  acetaminophen (TYLENOL) suppository 650 mg (has no administration in time range)  polyethylene glycol (MIRALAX / GLYCOLAX) packet 17 g (has no administration in time range)  ondansetron (ZOFRAN) tablet 4 mg (has no administration in time range)    Or  ondansetron (ZOFRAN) injection 4 mg (has no administration in time range)  aspirin EC tablet 81 mg (81 mg Oral Given 12/15/22 0936)  nitroGLYCERIN (NITROSTAT) SL tablet 0.4 mg (has no administration in time range)  atorvastatin (LIPITOR) tablet 80 mg (80 mg Oral Given 12/15/22 0935)  mometasone-formoterol (DULERA) 200-5 MCG/ACT inhaler 2 puff (2 puffs Inhalation Given 12/15/22 0929)  fluticasone (FLONASE) 50 MCG/ACT nasal spray 2 spray (2 sprays Each Nare Given 12/15/22 0937)   guaiFENesin-dextromethorphan (ROBITUSSIN DM) 100-10 MG/5ML syrup 10 mL (has no administration in time range)  ipratropium-albuterol (DUONEB) 0.5-2.5 (3) MG/3ML nebulizer solution 3 mL (has no administration in time range)  famotidine (PEPCID) tablet 40 mg (40 mg Oral Given 12/15/22 0936)  ticagrelor (BRILINTA) tablet 90 mg (90 mg Oral Given 12/15/22 0936)  metoprolol succinate (TOPROL-XL) 24 hr tablet 200 mg (200 mg Oral Not Given 12/15/22 0935)  pantoprazole (PROTONIX) injection 40 mg (40 mg Intravenous Given 12/15/22 0937)  lactated ringers infusion ( Intravenous New Bag/Given 12/15/22 1133)  acetaminophen (TYLENOL) tablet 650 mg (650 mg Oral Given 12/15/22 0517)  lidocaine (LIDODERM) 5 % 1 patch (1 patch Transdermal Patch Applied 12/15/22 0937)  morphine (PF) 2 MG/ML injection 2 mg (has no administration in time range)  heparin ADULT infusion 100 units/mL (25000 units/227mL) (800 Units/hr Intravenous New Bag/Given 12/15/22 1257)  morphine (PF) 2 MG/ML injection 2 mg (2 mg Intravenous Given 12/14/22 2333)  lactated ringers bolus 500 mL (0 mLs Intravenous Stopped 12/15/22 0034)  alum & mag hydroxide-simeth (MAALOX/MYLANTA) 200-200-20 MG/5ML suspension 30 mL (30 mLs Oral Given 12/15/22 0210)    And  lidocaine (XYLOCAINE) 2 % viscous mouth solution 15 mL (15 mLs Oral Given 12/15/22 0211)  lactated ringers bolus 500 mL (500 mLs Intravenous New Bag/Given 12/15/22 0908)  heparin bolus via infusion 4,000 Units (4,000 Units Intravenous Bolus from Bag 12/15/22 1257)    Mobility walks with device     Focused Assessments Cardiac Assessment Handoff:    Lab Results  Component Value Date   CKTOTAL 991 (H) 05/24/2008   CKMB 100.4 (H) 05/24/2008   TROPONINI <0.30 06/20/2014   Lab Results  Component Value Date   DDIMER (H) 05/23/2008    0.65        AT THE INHOUSE ESTABLISHED CUTOFF VALUE OF 0.48 ug/mL FEU, THIS ASSAY HAS BEEN DOCUMENTED IN THE LITERATURE TO HAVE   Does the Patient currently have chest pain? No     R Recommendations: See Admitting Provider Note  Report given to:   Additional Notes:

## 2022-12-15 NOTE — ED Notes (Signed)
Pt complain of increase in frequency & duration chest pain. Trop drawn & sent. No response from cardiology team to epic messages.

## 2022-12-15 NOTE — Progress Notes (Signed)
ANTICOAGULATION CONSULT NOTE  Pharmacy Consult for heparin Indication: chest pain/ACS  Allergies  Allergen Reactions   Acyclovir And Related     GI upset    Aspirin Other (See Comments)    GI upset at high doses only.    Patient Measurements: Height: 5\' 8"  (172.7 cm) Weight: 65 kg (143 lb 4.8 oz) IBW/kg (Calculated) : 68.4 Heparin Dosing Weight: TBW  Vital Signs: Temp: 98.2 F (36.8 C) (04/06 1926) Temp Source: Oral (04/06 1926) BP: 112/56 (04/06 1926) Pulse Rate: 63 (04/06 1700)  Labs: Recent Labs    12/14/22 2219 12/15/22 0020 12/15/22 0620 12/15/22 0725 12/15/22 0944 12/15/22 1158 12/15/22 1514 12/15/22 1745 12/15/22 1954  HGB 12.0* 10.7*  --  10.5*  --   --   --   --   --   HCT 39.0 33.2*  --  32.5*  --   --   --   --   --   PLT 218 182  --  182  --   --   --   --   --   HEPARINUNFRC  --   --   --   --   --   --   --   --  0.20*  CREATININE 2.47* 2.35*  --   --   --  2.53*  --   --   --   TROPONINIHS 9,381* 8,906*   < >  --  0,071*  --  2,197* 8,487*  --    < > = values in this interval not displayed.     Estimated Creatinine Clearance: 21.8 mL/min (A) (by C-G formula based on SCr of 2.53 mg/dL (H)).   Medical History: Past Medical History:  Diagnosis Date   Acute renal failure 05/31/2014   Angina decubitus 06/15/2014   CAD (coronary artery disease)    a. s/p stent RCA DES 9/09; b. 2014 Attempted PCI of OM1 @ [facility];  c. 04/2014 Cath: LAD 40-50p, D1 95-99 (chronic), LCX 30-40 inf branch, OM1 CTO, RCA 30-40p, RCA patent stent, EF 35%->Med Rx.   Chronic combined systolic and diastolic CHF (congestive heart failure)    a. EF about 40 to 45% per echo in April 2013;  b. 04/2014 Echo: EF 20-25%, sev LVH, sev glob HK, Gr 1 DD, mildly reduced RV fxn, PASP . c. 01/2017: EF improved to 50-55%.    Chronic low back pain    Complete heart block    COPD (chronic obstructive pulmonary disease) dx 06/2013   PFTs 07/08/13: mod obst with resp to  bronchodilator, moderate decrease diffusion, airtrapping   COPD with acute exacerbation 01/17/2017   COPD with asthma    07/08/13 PFT: FEV1 1.74L (66% pred, 30% change with BD), mod obst with resp to bronchodilator, moderate decrease diffusion, air-trapping 11/2013 Simple spiro>> clear obstruction, FEV1 1.30 L (47% pred) - trial of symbicort 160 2bid 01/26/15      Depression 09/15/2018   Assessment: Increased sadness and depression since losing job  Plan: Patient denies suicidal and homicidal ideations Patient would not like to start medication therapies at this time Will establish patient with community health and wellness for primary care   Dyslipidemia    a. on statin   Essential hypertension    HLD (hyperlipidemia)    HTN (hypertension)    a. Reports intolerance to hydralazine; b. no beta blockers 2/2 bradycardia;  c. failed on ACE and ARB.   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 04/2014 Echo: EF 20-25%, sev glob HK.   Mixed Ischemic/Non-Ischemic Cardiomyopathy    probable mixed ischemic and non-ischemic    Noncompliance    OSA (obstructive sleep apnea) 04/18/2018   06/11/16 - home sleep study shows AHI of 2.9 an hour with the lowest SaO2 of 79% with an average of 93%  03/08/2018-Home sleep study-AHI 7/HR, SaO2 low 81%    Paroxysmal atrial fibrillation    a. identified on device interrogation 01/2016   Pneumonia 09/02/2013   Pulmonary edema 05/10/2014   Respiratory arrest 05/18/2014   Sinus bradycardia 05/31/2014   Ventricular tachycardia    treated with ATP,  CL 250-300 msec     Assessment: 81 YOM presenting with CP, recent PCI, on DAPT PTA, no AC. Pharmacy consulted to dose heparin.  Heparin level 0.2 units/mL (subtherapeutic) on heparin 800 units/hr. No signs of bleeding.  Goal of Therapy:  Heparin level 0.3-0.7  units/ml Monitor platelets by anticoagulation protocol: Yes   Plan:  Increase heparin to 1000 units/hr Check 8hr heparin level at 0600 Monitor for signs/symptoms of bleeding F/u long-term plans: no plans for cath as of now  Eldridge Scot, PharmD Clinical Pharmacist 12/15/2022 9:33 PM

## 2022-12-15 NOTE — ED Notes (Signed)
EKG given to Sharilyn Sites PA, pt was seen by provider at bedside. Verbal order given to trend troponin.

## 2022-12-16 ENCOUNTER — Inpatient Hospital Stay (HOSPITAL_COMMUNITY): Payer: Medicare Other

## 2022-12-16 DIAGNOSIS — R072 Precordial pain: Secondary | ICD-10-CM

## 2022-12-16 LAB — BASIC METABOLIC PANEL
Anion gap: 11 (ref 5–15)
BUN: 47 mg/dL — ABNORMAL HIGH (ref 8–23)
CO2: 22 mmol/L (ref 22–32)
Calcium: 8.5 mg/dL — ABNORMAL LOW (ref 8.9–10.3)
Chloride: 100 mmol/L (ref 98–111)
Creatinine, Ser: 2.98 mg/dL — ABNORMAL HIGH (ref 0.61–1.24)
GFR, Estimated: 21 mL/min — ABNORMAL LOW (ref >60)
Glucose, Bld: 106 mg/dL — ABNORMAL HIGH (ref 70–99)
Potassium: 4.3 mmol/L (ref 3.5–5.1)
Sodium: 133 mmol/L — ABNORMAL LOW (ref 135–145)

## 2022-12-16 LAB — HEPARIN LEVEL (UNFRACTIONATED): Heparin Unfractionated: 0.4 IU/mL (ref 0.30–0.70)

## 2022-12-16 LAB — TROPONIN I (HIGH SENSITIVITY): Troponin I (High Sensitivity): 9362 ng/L (ref <18)

## 2022-12-16 NOTE — Plan of Care (Signed)
  Problem: Clinical Measurements: Goal: Respiratory complications will improve Outcome: Progressing Goal: Cardiovascular complication will be avoided Outcome: Progressing   Problem: Activity: Goal: Risk for activity intolerance will decrease Outcome: Progressing   Problem: Coping: Goal: Level of anxiety will decrease Outcome: Progressing   Problem: Pain Managment: Goal: General experience of comfort will improve Outcome: Progressing   Problem: Safety: Goal: Ability to remain free from injury will improve Outcome: Progressing   

## 2022-12-16 NOTE — Progress Notes (Signed)
ANTICOAGULATION CONSULT NOTE  Pharmacy Consult for heparin Indication: chest pain/ACS  Allergies  Allergen Reactions   Acyclovir And Related     GI upset    Aspirin Other (See Comments)    GI upset at high doses only.    Patient Measurements: Height: 5\' 8"  (172.7 cm) Weight: 65.5 kg (144 lb 6.4 oz) IBW/kg (Calculated) : 68.4 Heparin Dosing Weight: TBW  Vital Signs: Temp: 97.8 F (36.6 C) (04/07 0731) Temp Source: Oral (04/07 0731) BP: 117/57 (04/07 0731) Pulse Rate: 60 (04/07 0731)  Labs: Recent Labs    12/14/22 2219 12/15/22 0020 12/15/22 0620 12/15/22 0725 12/15/22 0944 12/15/22 1158 12/15/22 1514 12/15/22 1745 12/15/22 1954 12/16/22 0700  HGB 12.0* 10.7*  --  10.5*  --   --   --   --   --   --   HCT 39.0 33.2*  --  32.5*  --   --   --   --   --   --   PLT 218 182  --  182  --   --   --   --   --   --   HEPARINUNFRC  --   --   --   --   --   --   --   --  0.20* 0.40  CREATININE 2.47* 2.35*  --   --   --  2.53*  --   --   --   --   TROPONINIHS 9,381* 8,906*   < >  --  7,588*  --  3,254* 8,487*  --   --    < > = values in this interval not displayed.     Estimated Creatinine Clearance: 21.9 mL/min (A) (by C-G formula based on SCr of 2.53 mg/dL (H)).   Medical History: Past Medical History:  Diagnosis Date   Acute renal failure 05/31/2014   Angina decubitus 06/15/2014   CAD (coronary artery disease)    a. s/p stent RCA DES 9/09; b. 2014 Attempted PCI of OM1 @ [facility];  c. 04/2014 Cath: LAD 40-50p, D1 95-99 (chronic), LCX 30-40 inf branch, OM1 CTO, RCA 30-40p, RCA patent stent, EF 35%->Med Rx.   Chronic combined systolic and diastolic CHF (congestive heart failure)    a. EF about 40 to 45% per echo in April 2013;  b. 04/2014 Echo: EF 20-25%, sev LVH, sev glob HK, Gr 1 DD, mildly reduced RV fxn, PASP . c. 01/2017: EF improved to 50-55%.    Chronic low back pain    Complete heart block    COPD (chronic obstructive pulmonary disease) dx 06/2013   PFTs  07/08/13: mod obst with resp to bronchodilator, moderate decrease diffusion, airtrapping   COPD with acute exacerbation 01/17/2017   COPD with asthma    07/08/13 PFT: FEV1 1.74L (66% pred, 30% change with BD), mod obst with resp to bronchodilator, moderate decrease diffusion, air-trapping 11/2013 Simple spiro>> clear obstruction, FEV1 1.30 L (47% pred) - trial of symbicort 160 2bid 01/26/15      Depression 09/15/2018   Assessment: Increased sadness and depression since losing job  Plan: Patient denies suicidal and homicidal ideations Patient would not like to start medication therapies at this time Will establish patient with community health and wellness for primary care   Dyslipidemia    a. on statin   Essential hypertension    HLD (hyperlipidemia)    HTN (hypertension)    a. Reports intolerance to hydralazine; b. no beta blockers 2/2 bradycardia;  c.  failed on ACE and ARB.   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 04/2014 Echo: EF 20-25%, sev glob HK.   Mixed Ischemic/Non-Ischemic Cardiomyopathy    probable mixed ischemic and non-ischemic    Noncompliance    OSA (obstructive sleep apnea) 04/18/2018   06/11/16 - home sleep study shows AHI of 2.9 an hour with the lowest SaO2 of 79% with an average of 93%  03/08/2018-Home sleep study-AHI 7/HR, SaO2 low 81%    Paroxysmal atrial fibrillation    a. identified on device interrogation 01/2016   Pneumonia 09/02/2013   Pulmonary edema 05/10/2014   Respiratory arrest 05/18/2014   Sinus bradycardia 05/31/2014   Ventricular tachycardia    treated with ATP,  CL 250-300 msec     Assessment: 89 yoM with recent ACS and PCI readmitted with CP. Pt started on IV heparin.  Heparin level therapeutic at 0.4.  Goal of Therapy:  Heparin level 0.3-0.7 units/ml Monitor platelets by anticoagulation  protocol: Yes   Plan:  Continue heparin 1000 units/h Daily heparin level and CBC  Fredonia Highland, PharmD, [place], [facility] Clinical Pharmacist [phone] Please check AMION for all [facility] Pharmacy numbers 12/16/2022
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
## 2022-12-17 DIAGNOSIS — I255 Ischemic cardiomyopathy: Secondary | ICD-10-CM

## 2022-12-17 LAB — URINALYSIS, ROUTINE W REFLEX MICROSCOPIC
Bilirubin Urine: NEGATIVE
Glucose, UA: NEGATIVE mg/dL
Hgb urine dipstick: NEGATIVE
Ketones, ur: NEGATIVE mg/dL
Leukocytes,Ua: NEGATIVE
Nitrite: NEGATIVE
Protein, ur: NEGATIVE mg/dL
Specific Gravity, Urine: 1.014 (ref 1.005–1.030)
pH: 5 (ref 5.0–8.0)

## 2022-12-17 LAB — BASIC METABOLIC PANEL
Anion gap: 7 (ref 5–15)
BUN: 48 mg/dL — ABNORMAL HIGH (ref 8–23)
CO2: 21 mmol/L — ABNORMAL LOW (ref 22–32)
Calcium: 7.9 mg/dL — ABNORMAL LOW (ref 8.9–10.3)
Chloride: 104 mmol/L (ref 98–111)
Creatinine, Ser: 3.09 mg/dL — ABNORMAL HIGH (ref 0.61–1.24)
GFR, Estimated: 20 mL/min — ABNORMAL LOW (ref >60)
Glucose, Bld: 102 mg/dL — ABNORMAL HIGH (ref 70–99)
Potassium: 4.3 mmol/L (ref 3.5–5.1)
Sodium: 132 mmol/L — ABNORMAL LOW (ref 135–145)

## 2022-12-17 LAB — CBC
HCT: 28.6 % — ABNORMAL LOW (ref 39.0–52.0)
Hemoglobin: 9.5 g/dL — ABNORMAL LOW (ref 13.0–17.0)
MCH: 29.1 pg (ref 26.0–34.0)
MCHC: 33.2 g/dL (ref 30.0–36.0)
MCV: 87.5 fL (ref 80.0–100.0)
Platelets: 194 10*3/uL (ref 150–400)
RBC: 3.27 MIL/uL — ABNORMAL LOW (ref 4.22–5.81)
RDW: 14.3 % (ref 11.5–15.5)
WBC: 8.7 10*3/uL (ref 4.0–10.5)
nRBC: 0 % (ref 0.0–0.2)

## 2022-12-17 LAB — HEPARIN LEVEL (UNFRACTIONATED): Heparin Unfractionated: 0.36 IU/mL (ref 0.30–0.70)

## 2022-12-17 MED ORDER — ENOXAPARIN SODIUM 30 MG/0.3ML IJ SOSY
30.0000 mg | PREFILLED_SYRINGE | Freq: Every day | INTRAMUSCULAR | Status: DC
  Administered 2022-12-17: 30 mg via SUBCUTANEOUS
  Filled 2022-12-17: qty 0.3

## 2022-12-17 MED ORDER — ENOXAPARIN SODIUM 40 MG/0.4ML IJ SOSY: 40.0000 mg | PREFILLED_SYRINGE | Freq: Every day | INTRAMUSCULAR | Status: DC

## 2022-12-17 MED ORDER — SODIUM CHLORIDE 0.9 % IV SOLN: INTRAVENOUS | Status: AC
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
## 2022-12-17 NOTE — Progress Notes (Signed)
ANTICOAGULATION CONSULT NOTE  Pharmacy Consult for heparin Indication: chest pain/ACS  Allergies  Allergen Reactions   Acyclovir And Related     GI upset    Aspirin Other (See Comments)    GI upset at high doses only.    Patient Measurements: Height: 5\' 8"  (172.7 cm) Weight: 64.5 kg (142 lb 3.2 oz) IBW/kg (Calculated) : 68.4 Heparin Dosing Weight: TBW  Vital Signs: Temp: 98.8 F (37.1 C) (04/08 0732) Temp Source: Oral (04/08 0732) BP: 135/58 (04/08 0732) Pulse Rate: 62 (04/07 2032)  Labs: Recent Labs    12/15/22 0020 12/15/22 0620 12/15/22 0725 12/15/22 0944 12/15/22 1158 12/15/22 1514 12/15/22 1745 12/15/22 1954 12/16/22 0700 12/17/22 0018  HGB 10.7*  --  10.5*  --   --   --   --   --   --  9.5*  HCT 33.2*  --  32.5*  --   --   --   --   --   --  28.6*  PLT 182  --  182  --   --   --   --   --   --  194  HEPARINUNFRC  --   --   --   --   --   --   --  0.20* 0.40 0.36  CREATININE 2.35*  --   --   --  2.53*  --   --   --  2.98* 3.09*  TROPONINIHS 8,906*   < >  --    < >  --  2,549* 8,487*  --  9,362*  --    < > = values in this interval not displayed.     Estimated Creatinine Clearance: 17.7 mL/min (A) (by C-G formula based on SCr of 3.09 mg/dL (H)).   Medical History: Past Medical History:  Diagnosis Date   Acute renal failure 05/31/2014   Angina decubitus 06/15/2014   CAD (coronary artery disease)    a. s/p stent RCA DES 9/09; b. 2014 Attempted PCI of OM1 @ [place];  c. 04/2014 Cath: LAD 40-50p, D1 95-99 (chronic), LCX 30-40 inf branch, OM1 CTO, RCA 30-40p, RCA patent stent, EF 35%->Med Rx.   Chronic combined systolic and diastolic CHF (congestive heart failure)    a. EF about 40 to 45% per echo in April 2013;  b. 04/2014 Echo: EF 20-25%, sev LVH, sev glob HK, Gr 1 DD, mildly reduced RV fxn, PASP . c. 01/2017: EF improved to 50-55%.    Chronic low back pain    Complete heart block    COPD (chronic obstructive pulmonary disease) dx 06/2013   PFTs  07/08/13: mod obst with resp to bronchodilator, moderate decrease diffusion, airtrapping   COPD with acute exacerbation 01/17/2017   COPD with asthma    07/08/13 PFT: FEV1 1.74L (66% pred, 30% change with BD), mod obst with resp to bronchodilator, moderate decrease diffusion, air-trapping 11/2013 Simple spiro>> clear obstruction, FEV1 1.30 L (47% pred) - trial of symbicort 160 2bid 01/26/15      Depression 09/15/2018   Assessment: Increased sadness and depression since losing job  Plan: Patient denies suicidal and homicidal ideations Patient would not like to start medication therapies at this time Will establish patient with [facility] for primary care   Dyslipidemia    a. on statin   Essential hypertension    HLD (hyperlipidemia)    HTN (hypertension)    a. Reports intolerance to hydralazine; b. no beta blockers 2/2 bradycardia;  c. failed  on ACE and ARB.   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 04/2014 Echo: EF 20-25%, sev glob HK.   Mixed Ischemic/Non-Ischemic Cardiomyopathy    probable mixed ischemic and non-ischemic    Noncompliance    OSA (obstructive sleep apnea) 04/18/2018   06/11/16 - home sleep study shows AHI of 2.9 an hour with the lowest SaO2 of 79% with an average of 93%  03/08/2018-Home sleep study-AHI 7/HR, SaO2 low 81%    Paroxysmal atrial fibrillation    a. identified on device interrogation 01/2016   Pneumonia 09/02/2013   Pulmonary edema 05/10/2014   Respiratory arrest 05/18/2014   Sinus bradycardia 05/31/2014   Ventricular tachycardia    treated with ATP,  CL 250-300 msec     Assessment: 89 yoM with recent ACS and PCI readmitted with CP. Pt started on IV heparin.  Heparin level therapeutic at 0.36.  Goal of Therapy:  Heparin level 0.3-0.7 units/ml Monitor platelets by anticoagulation  protocol: Yes   Plan:  Continue heparin 1000 units/h Daily heparin level and CBC  Harland German, PharmD Clinical Pharmacist **Pharmacist phone directory can now be found on [email or web address] ([email or web address]).  Listed under [facility].

## 2022-12-18 ENCOUNTER — Telehealth (HOSPITAL_COMMUNITY): Payer: Self-pay

## 2022-12-18 DIAGNOSIS — N179 Acute kidney failure, unspecified: Secondary | ICD-10-CM

## 2022-12-18 DIAGNOSIS — N289 Disorder of kidney and ureter, unspecified: Secondary | ICD-10-CM

## 2022-12-18 DIAGNOSIS — N9989 Other postprocedural complications and disorders of genitourinary system: Secondary | ICD-10-CM

## 2022-12-18 LAB — BASIC METABOLIC PANEL
Anion gap: 7 (ref 5–15)
BUN: 32 mg/dL — ABNORMAL HIGH (ref 8–23)
CO2: 21 mmol/L — ABNORMAL LOW (ref 22–32)
Calcium: 8.2 mg/dL — ABNORMAL LOW (ref 8.9–10.3)
Chloride: 107 mmol/L (ref 98–111)
Creatinine, Ser: 2.58 mg/dL — ABNORMAL HIGH (ref 0.61–1.24)
GFR, Estimated: 25 mL/min — ABNORMAL LOW (ref >60)
Glucose, Bld: 100 mg/dL — ABNORMAL HIGH (ref 70–99)
Potassium: 4.2 mmol/L (ref 3.5–5.1)
Sodium: 135 mmol/L (ref 135–145)

## 2022-12-18 LAB — CBC
HCT: 25.5 % — ABNORMAL LOW (ref 39.0–52.0)
Hemoglobin: 8.5 g/dL — ABNORMAL LOW (ref 13.0–17.0)
MCH: 28.9 pg (ref 26.0–34.0)
MCHC: 33.3 g/dL (ref 30.0–36.0)
MCV: 86.7 fL (ref 80.0–100.0)
Platelets: 220 10*3/uL (ref 150–400)
RBC: 2.94 MIL/uL — ABNORMAL LOW (ref 4.22–5.81)
RDW: 14.4 % (ref 11.5–15.5)
WBC: 6.6 10*3/uL (ref 4.0–10.5)
nRBC: 0 % (ref 0.0–0.2)
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
## 2022-12-18 NOTE — Telephone Encounter (Signed)
Heart Failure Patient Advocate Encounter  Medication assistance forms for Brilinta have been started. Patient has signed forms.  Provider information will need to be completed before submitting.  Forms have been attached to patient chart under 'Media' tab. Routing to appropriate office.  Burnell Blanks, CPhT Rx Patient Advocate Phone: [phone]

## 2022-12-18 NOTE — Progress Notes (Signed)
[facility] Progress Note   Subjective:   Feeling well, hopeful for discharge today.  UOP incomplete - noted to be .   No LUTs or urinary issues noted.   Objective Vitals:   12/17/22 2000 12/17/22 2127 12/17/22 2321 12/18/22 0312  BP: 132/67  (!) 132/58 (!) 151/51  Pulse: 62  66 65  Resp: 20  15 20   Temp: 98.8 F (37.1 C)  98.3 F (36.8 C) 98.4 F (36.9 C)  TempSrc: Oral  Oral Oral  SpO2: 94% 95% 93% 92%  Weight:    63.9 kg  Height:       Physical Exam Gen: well appearing man calm in bed  Eyes: anicteric ENT:MMM Neck: supple, no JVD CV:  RRR, II/VI SEM; wearing lidocaine patch on sternum Abd: soft, nontender Lungs; clear GU: no foley Extr:  no edema Neuro: nonfocal Skin: no rashes, no livedo on legs.  Additional Objective Labs: Basic Metabolic Panel: Recent Labs  Lab 12/16/22 0700 12/17/22 0018 12/18/22 0015  NA 133* 132* 135  K 4.3 4.3 4.2  CL 100 104 107  CO2 22 21* 21*  GLUCOSE 106* 102* 100*  BUN 47* 48* 32*  CREATININE 2.98* 3.09* 2.58*  CALCIUM 8.5* 7.9* 8.2*   Liver Function Tests: Recent Labs  Lab 12/14/22 2219  AST 69*  ALT 31  ALKPHOS 88  BILITOT 1.3*  PROT 8.4*  ALBUMIN 4.0   No results for input(s): "LIPASE", "AMYLASE" in the last 168 hours. CBC: Recent Labs  Lab 12/14/22 2219 12/15/22 0020 12/15/22 0725 12/17/22 0018 12/18/22 0015  WBC 17.9* 17.0* 16.8* 8.7 6.6  NEUTROABS 15.2*  --   --   --   --   HGB 12.0* 10.7* 10.5* 9.5* 8.5*  HCT 39.0 33.2* 32.5* 28.6* 25.5*  MCV 91.3 91.0 90.8 87.5 86.7  PLT 218 182 182 194 220   Blood Culture    Component Value Date/Time   SDES URINE, CLEAN CATCH 06/01/2021 1730   SPECREQUEST  06/01/2021 1730    NONE Performed at [facility], [address], [place], [place] [address]    CULT >=100,000 COLONIES/mL ESCHERICHIA COLI (A) 06/01/2021 1730   REPTSTATUS 06/04/2021 FINAL 06/01/2021 1730    Cardiac Enzymes: No results for input(s): "CKTOTAL", "CKMB",  "CKMBINDEX", "TROPONINI" in the last 168 hours. CBG: No results for input(s): "GLUCAP" in the last 168 hours. Iron Studies: No results for input(s): "IRON", "TIBC", "TRANSFERRIN", "FERRITIN" in the last 72 hours. @lablastinr3 @ Studies/Results: US RENAL  Result Date: 12/16/2022 CLINICAL DATA:  [id number] AKI (acute kidney injury) [id number] EXAM: RENAL / URINARY TRACT ULTRASOUND COMPLETE COMPARISON:  November 28, 2022 FINDINGS: Right Kidney: Renal measurements: 10.0 x 4.9 x 5.3 cm = volume: 136 mL. Echogenicity within normal limits. No mass or hydronephrosis visualized. Left Kidney: Renal measurements: 11.1 x 5.9 x 5.4 cm = volume: 177 mL. Echogenicity within normal limits. No mass or hydronephrosis visualized. Bladder: Appears normal for degree of bladder distention. Other: None. IMPRESSION: No hydronephrosis. Electronically Signed   By: Meda Klinefelter M.D.   On: 12/16/2022 10:55   Medications:   acetaminophen  650 mg Oral Q8H   aspirin EC  81 mg Oral Daily   atorvastatin  80 mg Oral Daily   enoxaparin (LOVENOX) injection  30 mg Subcutaneous QHS   famotidine  20 mg Oral Daily   fluticasone  2 spray Each Nare Daily   lidocaine  1 patch Transdermal Q24H   metoprolol  200 mg Oral Daily  mometasone-formoterol  2 puff Inhalation BID   pantoprazole (PROTONIX) IV  40 mg Intravenous Q12H   sodium chloride flush  3 mL Intravenous Q12H   ticagrelor  90 mg Oral BID    Assessment/Plan [name] is an 82 y.o. male with CAD, COPD, HTN, HFrEF (12/2021 EF 30-35%), HL, h/o VT s/p ICD placement who is seen for evaluation and management of AKI.    **AKI: normal baseline renal function now with (reported but not documented) nonoliguric AKI.  He had contrast exposure x 3 in the week prior to AKI as well as modest hypotension - expect this is CIN + hypoperfusion.  Renal US normal.  UA bland. His kidney function plateaued yesterday and has started to improve today (peak 3.1, today 2.6) and I expect it will continue  to improve barring further insults.   For now continue supportive care.   Avoid nephrotoxins, maintain euvolemia- doesn't appear to need IVF or diuretics today.  Cont to hold entresto for now until closer to baseline kidney function.   **CAD s/p recent NSTEMI and staged PCI,  HFrEF:  Per cardiology; agree with holding entresto at the moment.    **HL: on statin   **A fib: per cardiology.   Nothing further to add.  I suspect he will return to baseline kidney function over the coming weeks but if not he can be referred to nephrology as an outpt.  Please call with concerns.   Estill Bakes MD 12/18/2022, 8:04 AM  [facility] Pager: [phone]

## 2022-12-18 NOTE — Plan of Care (Signed)

## 2022-12-18 NOTE — Progress Notes (Signed)
Discharge instructions (including medications) discussed with and copy provided to patient/caregiver 
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
## 2022-12-19 ENCOUNTER — Telehealth: Payer: Self-pay

## 2022-12-19 NOTE — Transitions of Care (Post Inpatient/ED Visit) (Signed)
   12/19/2022  Name: [name] MRN: [id number] DOB: 1941-07-15  Today's TOC FU Call Status: Today's TOC FU Call Status:: Unsuccessul Call (1st Attempt) Unsuccessful Call (1st Attempt) Date: 12/19/22  Attempted to reach the patient regarding the most recent Inpatient/ED visit.  Follow Up Plan: Additional outreach attempts will be made to reach the patient to complete the Transitions of Care (Post Inpatient/ED visit) call.   Signature Robyne Peers, RN

## 2022-12-20 ENCOUNTER — Telehealth: Payer: Self-pay

## 2022-12-20 ENCOUNTER — Telehealth: Payer: Self-pay | Admitting: Licensed Clinical Social Worker

## 2022-12-20 NOTE — Telephone Encounter (Signed)
FYI- From the discharge call:    He said he is anxious to see Dr Swaziland tomorrow. I tried to explain to him that his appointment is with Joni Reining, NP; but he insisted he is seeing Dr Swaziland. He is not taking any medications as noted below and said he needs to speak to Dr Swaziland about what he should be taking.  He said he has a nebulizer but no nebulizer solution. 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 He does not have Medicare A/B or Medicaid.  I spoke to Octavio Graves, LCSW/ Cardiology about his lack of insurance.  She will not be in the cardiology clinic when he is there tomorrow but she will leave information for him about [facility]/ [employer] and [place].  It needs to be determined why he does not at least have Medicare A.  Being uinsured, he will need assistance obtaining/ affording his medications.    He said he doesn't have any medications and only takes Tylenol. I asked him multiple times if he has any medications on the  list and he said no, he only takes tylenol.  He then said that he has some inhalers that he uses " from time to time."  The patient said he will talk to Dr Swaziland tomorrow about his medications.     He reports that his balance is unsteady at times but he does not use any assistive device.    Follow-up Provider: Bertram Denver, NP - 01/14/2023.

## 2022-12-20 NOTE — Transitions of Care (Post Inpatient/ED Visit) (Signed)
   12/20/2022  Name: [name] MRN: [id number] DOB: [birth year]  Today's TOC FU Call Status: TOC FU Call Complete Date: 12/20/22  Transition Care Management Follow-up Telephone Call Date of Discharge: 12/18/22 Discharge Facility: [name]) Type of Discharge: Inpatient Admission Primary Inpatient Discharge Diagnosis:: chest pain How have you been since you were released from the hospital?: Same Any questions or concerns?: Yes Patient Questions/Concerns:: He said he is anxious to see Dr Swaziland tomorrow. I tried to explain to him that his appointment is with Joni Reining, NP; but he insisted he is seeing Dr Swaziland. He is not taking any medications as noted below and said he needs to speak to Dr Swaziland about what he should be taking.  He said he has a nebulizer but no nebulizer solution. 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 He does not have Medicare A/B or Medicaid.  I spoke to Octavio Graves, LCSW/ Cardiology about his lack of insurance.  She will not be in the cardiology clinic when he is there tomorrow but she will leave information for him about [facility]/ [employer] and [place].  It needs to be determined why he does not at least have Medicare A.  Being uinsured, he will need assistance obtaining/ affording his medications. Patient Questions/Concerns Addressed: Notified Provider of Patient Questions/Concerns, Other:  Items Reviewed: Did you receive and understand the discharge instructions provided?: Yes (He stated that he has them but did not have them available at the time of this call.) Medications obtained and verified?: No (He said he doesn't ahve any medications and only takes Tylenol. I asked him multiple times if he has any medications on the  list and he said no, he only takes tylenol.  He then said that he has some inhalers that he uses " from time to time.") Medications Not Reviewed Reasons:: Other: (the patient said he will  talk to Dr Swaziland tomorrow about his medications.) Any new allergies since your discharge?: No Dietary orders reviewed?: Yes Type of Diet Ordered:: heart healthy, low sodium.  however, he said he usually eats [employer]. Do you have support at home?: Yes People in Home: other relative(s), spouse Name of Support/Comfort Primary Source: He lives with his wife and grandson but he said his wife is disabled and receives help at home  Home Care and Equipment/Supplies: Were Home Health Services Ordered?: No Any new equipment or medical supplies ordered?: No  Functional Questionnaire: Do you need assistance with bathing/showering or dressing?: No Do you need assistance with meal preparation?: Yes Do you need assistance with eating?: No Do you have difficulty maintaining continence: No Do you need assistance with getting out of bed/getting out of a chair/moving?: No (He reports that his balance is unsteady at times but he does not use any assistive device.) Do you have difficulty managing or taking your medications?: No  Follow up appointments reviewed: PCP Follow-up appointment confirmed?: Yes Date of PCP follow-up appointment?: 01/14/23 Follow-up Provider: Bertram Denver, NP Specialist Hospital Follow-up appointment confirmed?: Yes Date of Specialist follow-up appointment?: 12/21/22 Follow-Up Specialty Provider:: cardiology - Joni Reining, NP Do you need transportation to your follow-up appointment?: No Do you understand care options if your condition(s) worsen?: Yes-patient verbalized understanding    SIGNATURE  Robyne Peers, RN

## 2022-12-20 NOTE — Telephone Encounter (Signed)
H&V Care Navigation CSW Progress Note  Clinical Social Worker  received call from [employer] from [employer] (pt PCP)  to f/u after recent hospital stay. Pt currently listed as self pay but per account notes pt meets citizenship and age guidelines for Medicare. Note pt has previous Medicare card in system from as recently as 2022. Pt unsure about benefits but concerned about medications and costs of care.   I have provided Brandie, CMA, information about Medicare, SHIIP counselors, my card and SNAP services card from [employer] to give to pt tomorrow as I will be out of clinic. I noted on information that pt should contact SHIIP or Medicare hotline prior to appt with [facility] team at [facility] clinic. I also have reached out to [place], LCSW, regarding this information.   Patient is participating in a Managed Medicaid Plan:  No, self pay but should be Medicare eligible  SDOH Screenings   Food Insecurity: No Food Insecurity (12/15/2022)  Recent Concern: Food Insecurity - Food Insecurity Present (12/10/2022)  Housing: Low Risk  (12/15/2022)  Transportation Needs: No Transportation Needs (12/15/2022)  Utilities: Not At Risk (12/15/2022)  Alcohol Screen: Low Risk  (12/13/2022)  Depression (PHQ2-9): Medium Risk (11/05/2022)  Financial Resource Strain: High Risk (12/13/2022)  Tobacco Use: Medium Risk (12/14/2022)   Octavio Graves, MSW, LCSW Clinical Social Worker [facility] Heart/Vascular Care Navigation  [phone]- work cell phone (preferred) [phone]- desk phone

## 2022-12-21 ENCOUNTER — Ambulatory Visit (INDEPENDENT_AMBULATORY_CARE_PROVIDER_SITE_OTHER): Payer: Medicare Other | Admitting: Adult Health

## 2022-12-21 ENCOUNTER — Encounter: Payer: Self-pay | Admitting: Adult Health

## 2022-12-21 VITALS — BP 136/86 | HR 71 | Ht 68.0 in | Wt 142.4 lb

## 2022-12-21 DIAGNOSIS — I5022 Chronic systolic (congestive) heart failure: Secondary | ICD-10-CM

## 2022-12-21 DIAGNOSIS — I2510 Atherosclerotic heart disease of native coronary artery without angina pectoris: Secondary | ICD-10-CM

## 2022-12-21 DIAGNOSIS — I10 Essential (primary) hypertension: Secondary | ICD-10-CM

## 2022-12-21 DIAGNOSIS — Z79899 Other long term (current) drug therapy: Secondary | ICD-10-CM

## 2022-12-21 DIAGNOSIS — R079 Chest pain, unspecified: Secondary | ICD-10-CM

## 2022-12-21 DIAGNOSIS — Z95810 Presence of automatic (implantable) cardiac defibrillator: Secondary | ICD-10-CM

## 2022-12-21 DIAGNOSIS — E7800 Pure hypercholesterolemia, unspecified: Secondary | ICD-10-CM
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
## 2022-12-21 NOTE — Patient Instructions (Signed)
Medication Instructions:  Please discard the medications in your white bag. The medications in your grey bag are your CURRENT medications. *If you need a refill on your cardiac medications before your next appointment, please call your pharmacy*   Lab Work: Your physician recommends that you lab work today: BMET   If you have labs (blood work) drawn today and your tests are completely normal, you will receive your results only by: MyChart Message (if you have MyChart) OR A paper copy in the mail If you have any lab test that is abnormal or we need to change your treatment, we will call you to review the results.   Testing/Procedures: None Ordered   Follow-Up: At [facility], you and your health needs are our priority.  As part of our continuing mission to provide you with exceptional heart care, we have created designated Provider Care Teams.  These Care Teams include your primary Cardiologist (physician) and Advanced Practice Providers (APPs -  Physician Assistants and Nurse Practitioners) who all work together to provide you with the care you need, when you need it.  We recommend signing up for the patient portal called "MyChart".  Sign up information is provided on this After Visit Summary.  MyChart is used to connect with patients for Virtual Visits (Telemedicine).  Patients are able to view lab/test results, encounter notes, upcoming appointments, etc.  Non-urgent messages can be sent to your provider as well.   To learn more about what you can do with MyChart, go to [email or web address].    Your next appointment:   1 month(s)  Provider:   Peter Swaziland, MD  or Joni Reining, DNP, ANP      Other Instructions Your provider has recommended you make an appointment with your Ophthalmologist.

## 2022-12-22 LAB — BASIC METABOLIC PANEL
BUN/Creatinine Ratio: 9 — ABNORMAL LOW (ref 10–24)
BUN: 17 mg/dL (ref 8–27)
CO2: 24 mmol/L (ref 20–29)
Calcium: 8.6 mg/dL (ref 8.6–10.2)
Chloride: 104 mmol/L (ref 96–106)
Creatinine, Ser: 1.81 mg/dL — ABNORMAL HIGH (ref 0.76–1.27)
Glucose: 78 mg/dL (ref 70–99)
Potassium: 4.2 mmol/L (ref 3.5–5.2)
Sodium: 148 mmol/L — ABNORMAL HIGH (ref 134–144)
eGFR: 38 mL/min/{1.73_m2} — ABNORMAL LOW (ref >59)

## 2022-12-24 ENCOUNTER — Telehealth: Payer: Self-pay

## 2022-12-24 NOTE — Telephone Encounter (Addendum)
Called patient regarding results. Left message for patient to call office.----- Message from Jodelle Gross, NP sent at 12/24/2022  2:00 PM EDT ----- I have reviewed his labs since stopping the Entresto and spironolactone. I separated this out from his medications on last office visit. His kidney function is improved. Please have him restart Entresto, but at a lower dose 24/25 mg BID. He will need follow up labs in 2 weeks. BMET.  He is very confused on his medications. He will need to make sure he has his wife or son with him on follow up appointment. Gets his medications from [facility].   KL

## 2022-12-28 ENCOUNTER — Other Ambulatory Visit (HOSPITAL_COMMUNITY): Payer: Self-pay

## 2022-12-28 ENCOUNTER — Encounter (HOSPITAL_COMMUNITY): Payer: Self-pay

## 2022-12-28 ENCOUNTER — Ambulatory Visit (HOSPITAL_COMMUNITY)
Admit: 2022-12-28 | Discharge: 2022-12-28 | Disposition: A | Discharge status: Home / Self Care | Payer: Self-pay | Attending: Physician Assistant | Admitting: Physician Assistant

## 2022-12-28 VITALS — BP 150/70 | HR 60 | Wt 147.2 lb

## 2022-12-28 DIAGNOSIS — Z955 Presence of coronary angioplasty implant and graft: Secondary | ICD-10-CM | POA: Diagnosis not present

## 2022-12-28 DIAGNOSIS — D649 Anemia, unspecified: Secondary | ICD-10-CM | POA: Diagnosis not present

## 2022-12-28 DIAGNOSIS — I2510 Atherosclerotic heart disease of native coronary artery without angina pectoris: Secondary | ICD-10-CM | POA: Insufficient documentation

## 2022-12-28 DIAGNOSIS — I480 Paroxysmal atrial fibrillation: Secondary | ICD-10-CM | POA: Diagnosis not present

## 2022-12-28 DIAGNOSIS — F1010 Alcohol abuse, uncomplicated: Secondary | ICD-10-CM | POA: Diagnosis not present

## 2022-12-28 DIAGNOSIS — Z7902 Long term (current) use of antithrombotics/antiplatelets: Secondary | ICD-10-CM | POA: Insufficient documentation

## 2022-12-28 DIAGNOSIS — I110 Hypertensive heart disease with heart failure: Secondary | ICD-10-CM | POA: Insufficient documentation

## 2022-12-28 DIAGNOSIS — I255 Ischemic cardiomyopathy: Secondary | ICD-10-CM

## 2022-12-28 DIAGNOSIS — Z79899 Other long term (current) drug therapy: Secondary | ICD-10-CM | POA: Diagnosis not present

## 2022-12-28 DIAGNOSIS — Z7982 Long term (current) use of aspirin: Secondary | ICD-10-CM | POA: Insufficient documentation

## 2022-12-28 DIAGNOSIS — I252 Old myocardial infarction: Secondary | ICD-10-CM | POA: Insufficient documentation

## 2022-12-28 DIAGNOSIS — I472 Ventricular tachycardia, unspecified: Secondary | ICD-10-CM

## 2022-12-28 DIAGNOSIS — E785 Hyperlipidemia, unspecified: Secondary | ICD-10-CM | POA: Insufficient documentation

## 2022-12-28 DIAGNOSIS — I5020 Unspecified systolic (congestive) heart failure: Secondary | ICD-10-CM

## 2022-12-28 LAB — COMPREHENSIVE METABOLIC PANEL
ALT: 56 U/L — ABNORMAL HIGH (ref 0–44)
AST: 30 U/L (ref 15–41)
Albumin: 3 g/dL — ABNORMAL LOW (ref 3.5–5.0)
Alkaline Phosphatase: 146 U/L — ABNORMAL HIGH (ref 38–126)
Anion gap: 9 (ref 5–15)
BUN: 14 mg/dL (ref 8–23)
CO2: 25 mmol/L (ref 22–32)
Calcium: 8.2 mg/dL — ABNORMAL LOW (ref 8.9–10.3)
Chloride: 106 mmol/L (ref 98–111)
Creatinine, Ser: 1.5 mg/dL — ABNORMAL HIGH (ref 0.61–1.24)
GFR, Estimated: 47 mL/min — ABNORMAL LOW (ref >60)
Glucose, Bld: 99 mg/dL (ref 70–99)
Potassium: 3.9 mmol/L (ref 3.5–5.1)
Sodium: 140 mmol/L (ref 135–145)
Total Bilirubin: 0.5 mg/dL (ref 0.3–1.2)
Total Protein: 6.5 g/dL (ref 6.5–8.1)

## 2022-12-28 LAB — CBC
HCT: 24.9 % — ABNORMAL LOW (ref 39.0–52.0)
Hemoglobin: 7.8 g/dL — ABNORMAL LOW (ref 13.0–17.0)
MCH: 28.4 pg (ref 26.0–34.0)
MCHC: 31.3 g/dL (ref 30.0–36.0)
MCV: 90.5 fL (ref 80.0–100.0)
Platelets: 314 10*3/uL (ref 150–400)
RBC: 2.75 MIL/uL — ABNORMAL LOW (ref 4.22–5.81)
RDW: 14.2 % (ref 11.5–15.5)
WBC: 6.2 10*3/uL (ref 4.0–10.5)
nRBC: 0 % (ref 0.0–0.2)

## 2022-12-28 LAB — BRAIN NATRIURETIC PEPTIDE: B Natriuretic Peptide: 1474 pg/mL — ABNORMAL HIGH (ref 0.0–100.0)

## 2022-12-28 NOTE — Addendum Note (Signed)
Encounter addended by: Andrey Farmer, PA-C on: 12/28/2022 4:32 PM  Actions taken: Clinical Note Signed

## 2022-12-28 NOTE — Patient Instructions (Addendum)
Labs done today. We will contact you only if your labs are abnormal.  No medication changes were made. Please continue all current medications as prescribed.  Your physician recommends that you schedule a follow-up appointment in: 6 weeks  If you have any questions or concerns before your next appointment please send [place] a message through [place] or call our office at [phone].    TO LEAVE A MESSAGE FOR THE NURSE SELECT OPTION 2, PLEASE LEAVE A MESSAGE INCLUDING: YOUR NAME DATE OF BIRTH CALL BACK NUMBER REASON FOR CALL**this is important as we prioritize the call backs  YOU WILL RECEIVE A CALL BACK THE SAME DAY AS LONG AS YOU CALL BEFORE 4:00 PM   Do the following things EVERYDAY: Weigh yourself in the morning before breakfast. Write it down and keep it in a log. Take your medicines as prescribed Eat low salt foods--Limit salt (sodium) to 2000 mg per day.  Stay as active as you can everyday Limit all fluids for the day to less than 2 liters   At the [facility], you and your health needs are our priority. As part of our continuing mission to provide you with exceptional heart care, we have created designated Provider Care Teams. These Care Teams include your primary Cardiologist (physician) and Advanced Practice Providers (APPs- Physician Assistants and Nurse Practitioners) who all work together to provide you with the care you need, when you need it.   You may see any of the following providers on your designated Care Team at your next follow up: Dr Arvilla Meres Dr Marca Ancona Dr. Marcos Eke, NP Robbie Lis, [place] [facility] [place], [place] Brynda Peon, NP Karle Plumber, PharmD   Please be sure to bring in all your medications bottles to every appointment.    Thank you for choosing [facility]

## 2022-12-28 NOTE — Addendum Note (Signed)
Encounter addended by: Andrey Farmer, PA-C on: 12/28/2022 4:31 PM  Actions taken: Clinical Note Signed

## 2022-12-28 NOTE — Addendum Note (Signed)
Encounter addended by: Andrey Farmer, PA-C on: 12/28/2022 4:51 PM  Actions taken: Clinical Note Signed
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
## 2022-12-28 NOTE — Addendum Note (Signed)
Encounter addended by: Andrey Farmer, PA-C on: 12/28/2022 4:52 PM  Actions taken: Clinical Note Signed

## 2022-12-30 NOTE — Addendum Note (Signed)
Encounter addended by: Andrey Farmer, PA-C on: 12/30/2022 7:29 PM  Actions taken: Clinical Note Signed

## 2022-12-31 ENCOUNTER — Telehealth (HOSPITAL_COMMUNITY): Payer: Self-pay

## 2022-12-31 ENCOUNTER — Telehealth (HOSPITAL_COMMUNITY): Payer: Self-pay | Admitting: Licensed Clinical Social Worker

## 2022-12-31 MED ORDER — ENTRESTO 24-26 MG PO TABS: 1.0000 | ORAL_TABLET | Freq: Two times a day (BID) | ORAL | 3 refills | Status: DC
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
## 2022-12-31 NOTE — Telephone Encounter (Signed)
Sending new Entresto prescription to [facility] per [employer]

## 2023-01-01 ENCOUNTER — Other Ambulatory Visit: Payer: Self-pay

## 2023-01-01 ENCOUNTER — Telehealth (HOSPITAL_COMMUNITY): Payer: Self-pay | Admitting: Cardiology

## 2023-01-01 ENCOUNTER — Telehealth: Payer: Self-pay

## 2023-01-01 DIAGNOSIS — Z79899 Other long term (current) drug therapy: Secondary | ICD-10-CM

## 2023-01-01 DIAGNOSIS — I5020 Unspecified systolic (congestive) heart failure: Secondary | ICD-10-CM

## 2023-01-01 MED ORDER — ENTRESTO 24-26 MG PO TABS
1.0000 | ORAL_TABLET | Freq: Two times a day (BID) | ORAL | 3 refills | Status: DC
  Filled 2023-01-01 – 2023-01-08 (×2): qty 180, 90d supply, fill #0

## 2023-01-01 NOTE — Telephone Encounter (Addendum)
Called patient regarding results. Advised patient to restart Entresto 24/26 mg Twice daily. And repeat BMET patient had understanding of resutls.----- Message from Jodelle Gross, NP sent at 12/24/2022  2:00 PM EDT ----- I have reviewed his labs since stopping the Entresto and spironolactone. I separated this out from his medications on last office visit. His kidney function is improved. Please have him restart Entresto, but at a lower dose 24/25 mg BID. He will need follow up labs in 2 weeks. BMET.  He is very confused on his medications. He will need to make sure he has his wife or son with him on follow up appointment. Gets his medications from [facility].   KL

## 2023-01-01 NOTE — Telephone Encounter (Signed)
Patient called.  Patient aware.  

## 2023-01-01 NOTE — Telephone Encounter (Signed)
-----   Message from Andrey Farmer, [place] sent at 12/28/2022  4:50 PM EDT ----- Kidney function improving. Can restart entresto at 24/26 mg BID. He will need a new Rx, he only has 97/103 mg dose at home and states they were sent to him by drug company. Needs BMP and CBC in 1 week. Please arrange.  I did explain to him over phone that he his hemoglobin is down and needs to watch for bleeding.

## 2023-01-04 ENCOUNTER — Other Ambulatory Visit: Payer: Self-pay

## 2023-01-07 ENCOUNTER — Telehealth: Payer: Self-pay | Admitting: Cardiology
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
## 2023-01-07 NOTE — Telephone Encounter (Signed)
Patient states in hospital until April 10. His employer needs a note to doctor to return to work.  He needs this before May 9th.  Please advise if this can be written so he can return. He does have an appt on May 10, BUT needs letter prior to that date.

## 2023-01-08 ENCOUNTER — Other Ambulatory Visit (HOSPITAL_COMMUNITY): Payer: Self-pay

## 2023-01-08 ENCOUNTER — Other Ambulatory Visit: Payer: Self-pay

## 2023-01-14 ENCOUNTER — Encounter: Payer: Self-pay | Admitting: Nurse Practitioner

## 2023-01-14 ENCOUNTER — Ambulatory Visit: Payer: Self-pay | Attending: Nurse Practitioner | Admitting: Nurse Practitioner

## 2023-01-14 VITALS — BP 162/71 | HR 75 | Resp 16 | Ht 68.0 in | Wt 141.8 lb

## 2023-01-14 DIAGNOSIS — Z09 Encounter for follow-up examination after completed treatment for conditions other than malignant neoplasm: Secondary | ICD-10-CM

## 2023-01-14 DIAGNOSIS — Z0289 Encounter for other administrative examinations: Secondary | ICD-10-CM

## 2023-01-14 DIAGNOSIS — D649 Anemia, unspecified: Secondary | ICD-10-CM

## 2023-01-14 DIAGNOSIS — I10 Essential (primary) hypertension: Secondary | ICD-10-CM

## 2023-01-14 NOTE — Progress Notes (Signed)
Assessment & Plan:  [name] was seen today for hospitalization follow-up and fmla.  Diagnoses and all orders for this visit:  Hospital discharge follow-up -     CMP14+EGFR -     CBC with Differential  Essential hypertension Blood pressure is elevated today.  He does not have any medications with him today for review.  Therefore I am unable to clear if there is a component of medication nonadherence. -     CMP14+EGFR  Anemia, unspecified type -     CBC with Differential  Encounter for completion of form with patient FMLA forms completed during office visit today   Patient has been counseled on age-appropriate routine health concerns for screening and prevention. These are reviewed and up-to-date. Referrals have been placed accordingly. Immunizations are up-to-date or declined.    Subjective:   Chief Complaint  Patient presents with   Hospitalization Follow-up   FMLA   HPI [name] 82 y.o. male presents to office today for hospital follow-up and completion of FMLA papers.  He is anticipating going back to work with no restrictions on Jan 17, 2023.  He has also been cleared by cardiology as well. Once again he is unclear about many of his medications that he is taking even after having an office visit on April 12 where the provider at that time went over his medications with him.    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  With severely reduced RV systolic function, CAD status post prior MI and recent PCI, COPD, NSVT, history of ICD placement (with BiV ICD generator change out 08/08/2021). Seen in the office with EP after device clinic was alerted for for episodes of VT converted with ATP.  He was placed on amiodarone.    HFU He presented to the ED with atypical chest pain and epigastric discomfort at rest on  12/14/2022.  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  For a few days this and discharged home with instructions to take aspirin, Brilinta, statin, metoprolol and Ranexa. Entresto was placed on hold due to acute kidney injury.   Today he endorses fatigue but states he would like to return to work as soon as possible due to his financial situation.  Will need to check CMP and CBC today as he was noted for anemia and AKI during his hospital admission last month.  BP Readings from Last 3 Encounters:  01/14/23 (!) 162/71  12/28/22 (!) 150/70  12/21/22 136/86    Review of Systems  Constitutional:  Positive for malaise/fatigue. Negative for fever and weight loss.  HENT: Negative.  Negative for nosebleeds.   Eyes: Negative.  Negative for blurred vision, double vision and photophobia.  Respiratory: Negative.  Negative for cough, hemoptysis, sputum production, shortness of breath and wheezing.   Cardiovascular: Negative.  Negative for chest pain, palpitations, orthopnea, claudication and leg swelling.  Gastrointestinal: Negative.  Negative for heartburn, nausea and vomiting.  Musculoskeletal: Negative.  Negative for myalgias.  Neurological: Negative.  Negative for dizziness, focal weakness, seizures and headaches.  Psychiatric/Behavioral: Negative.  Negative for suicidal ideas.     Past Medical History:  Diagnosis Date   Acute renal failure (HCC) 05/31/2014   Angina decubitus 06/15/2014   CAD (coronary artery disease)    a. s/p stent RCA DES 9/09; b. 2014 Attempted PCI of OM1 @ [place];  c. 04/2014 Cath: LAD  40-50p, D1 95-99 (chronic), LCX 30-40 inf branch, OM1 CTO, RCA 30-40p, RCA patent stent, EF 35%->Med Rx.   Chronic combined systolic and diastolic CHF (congestive heart failure) (HCC)    a. EF about 40 to 45% per echo in April 2013;  b. 04/2014 Echo: EF 20-25%, sev LVH, sev glob HK, Gr 1 DD, mildly reduced RV fxn, PASP . c. 01/2017: EF improved to 50-55%.    Chronic low back pain    Complete heart block (HCC)     COPD (chronic obstructive pulmonary disease) (HCC) dx 06/2013   PFTs 07/08/13: mod obst with resp to bronchodilator, moderate decrease diffusion, airtrapping   COPD with acute exacerbation (HCC) 01/17/2017   COPD with asthma    07/08/13 PFT: FEV1 1.74L (66% pred, 30% change with BD), mod obst with resp to bronchodilator, moderate decrease diffusion, air-trapping 11/2013 Simple spiro>> clear obstruction, FEV1 1.30 L (47% pred) - trial of symbicort 160 2bid 01/26/15      Depression 09/15/2018   Assessment: Increased sadness and depression since losing job  Plan: Patient denies suicidal and homicidal ideations Patient would not like to start medication therapies at this time Will establish patient with [facility] for primary care   Dyslipidemia    a. on statin   Essential hypertension    HLD (hyperlipidemia)    HTN (hypertension)    a. Reports intolerance to hydralazine; b. no beta blockers 2/2 bradycardia;  c. failed on ACE and ARB.   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 04/2014 Echo: EF 20-25%, sev glob HK.   Mixed Ischemic/Non-Ischemic Cardiomyopathy    probable mixed ischemic and non-ischemic    Noncompliance    OSA (obstructive sleep apnea) 04/18/2018   06/11/16 - home sleep study shows AHI of 2.9 an hour with the lowest SaO2 of 79% with an average of 93%  03/08/2018-Home sleep study-AHI 7/HR, SaO2 low 81%    Paroxysmal atrial fibrillation (HCC)    a. identified on device interrogation 01/2016   Pneumonia 09/02/2013   Pulmonary edema 05/10/2014   Respiratory arrest (HCC) 05/18/2014   Sinus bradycardia 05/31/2014   Ventricular tachycardia (HCC)    treated with ATP,  CL 250-300 msec    Past Surgical History:  Procedure Laterality Date   BI-VENTRICULAR IMPLANTABLE CARDIOVERTER DEFIBRILLATOR N/A 08/12/2014   MDT  Ovidio Kin XT CRTD implanted by Dr Johney Frame   BIV ICD GENERATOR CHANGEOUT N/A 08/08/2021   Procedure: BIV ICD GENERATOR CHANGEOUT;  Surgeon: Hillis Range, MD;  Location[facility] INVASIVE CV LAB;  Service: Cardiovascular;  Laterality: N/A;   CARDIAC CATHETERIZATION     ejection fraction 50%   CORONARY STENT INTERVENTION N/A 12/10/2022   Procedure: CORONARY STENT INTERVENTION;  Surgeon: Swaziland, Peter M, MD;  Location: [facility] INVASIVE CV LAB;  Service: Cardiovascular;  Laterality: N/A;   CORONARY STENT INTERVENTION N/A 12/12/2022   Procedure: CORONARY STENT INTERVENTION;  Surgeon: Swaziland, Peter M, MD;  Location: [facility] INVASIVE CV LAB;  Service: Cardiovascular;  Laterality: N/A;   CORONARY ULTRASOUND/IVUS N/A 12/10/2022   Procedure: Intravascular Ultrasound/IVUS;  Surgeon: Swaziland, Peter M, MD;  Location: [facility] INVASIVE CV LAB;  Service: Cardiovascular;  Laterality: N/A;   CORONARY ULTRASOUND/IVUS N/A 12/12/2022   Procedure: Coronary Ultrasound/IVUS;  Surgeon: Swaziland, Peter M, MD;  Location: [facility] INVASIVE CV LAB;  Service: Cardiovascular;  Laterality: N/A;   LEFT  HEART CATH AND CORONARY ANGIOGRAPHY N/A 12/10/2022   Procedure: LEFT HEART CATH AND CORONARY ANGIOGRAPHY;  Surgeon: Swaziland, Peter M, MD;  Location: [facility] INVASIVE CV LAB;  Service: Cardiovascular;  Laterality: N/A;   LEFT HEART CATHETERIZATION WITH CORONARY ANGIOGRAM N/A 05/08/2014   Procedure: LEFT HEART CATHETERIZATION WITH CORONARY ANGIOGRAM;  Surgeon: Micheline Chapman, MD;  Location: [facility] CATH LAB;  Service: Cardiovascular;  Laterality: N/A;    Family History  Problem Relation Age of Onset   Heart attack Neg Hx    Stroke Neg Hx     Social History Reviewed with no changes to be made today.   Outpatient Medications Prior to Visit  Medication Sig Dispense Refill   acetaminophen (TYLENOL) 650 MG CR tablet Take 650 mg by mouth every 8 (eight) hours as needed for pain.     albuterol (PROVENTIL) (2.5 MG/3ML) 0.083% nebulizer solution Take 3 mLs (2.5 mg total) by  nebulization every 6 (six) hours as needed for wheezing or shortness of breath. 180 mL 1   albuterol (VENTOLIN HFA) 108 (90 Base) MCG/ACT inhaler Inhale 1-2 puffs into the lungs every 6 (six) hours as needed for wheezing or shortness of breath. 18 g 1   aspirin EC 81 MG tablet Take 1 tablet (81 mg total) by mouth daily. Swallow whole. 30 tablet 11   atorvastatin (LIPITOR) 80 MG tablet Take 1 tablet (80 mg total) by mouth once daily. 90 tablet 3   famotidine (PEPCID) 40 MG tablet Take 1 tablet (40 mg total) by mouth daily. 90 tablet 1   Glycopyrrolate-Formoterol (BEVESPI AEROSPHERE) 9-4.8 MCG/ACT AERO Inhale 2 puffs into the lungs 2 (two) times daily. NEEDS PASS 10.7 g 11   ipratropium-albuterol (DUONEB) 0.5-2.5 (3) MG/3ML SOLN Take 3 mLs by nebulization every 6 (six) hours as needed (wheezing or shortness of breath). 360 mL 0   metoprolol (TOPROL-XL) 200 MG 24 hr tablet Take 1 tablet (200 mg total) by mouth daily. Take with or immediately following a meal. 30 tablet 2   mometasone-formoterol (DULERA) 200-5 MCG/ACT AERO Inhale 2 puffs into the lungs 2 (two) times daily. 13 g 6   nitroGLYCERIN (NITROSTAT) 0.4 MG SL tablet Place 1 tablet (0.4 mg total) under the tongue every 5 (five) minutes as needed for chest pain. 25 tablet 3   ranolazine (RANEXA) 500 MG 12 hr tablet Take 1 tablet (500 mg total) by mouth 2 (two) times daily. 60 tablet 2   sacubitril-valsartan (ENTRESTO) 24-26 MG Take 1 tablet by mouth 2 (two) times daily. 180 tablet 3   ticagrelor (BRILINTA) 90 MG TABS tablet Take 1 tablet (90 mg total) by mouth 2 (two) times daily. 60 tablet 5   No facility-administered medications prior to visit.    Allergies  Allergen Reactions   Acyclovir And Related     GI upset    Aspirin Other (See Comments)    GI upset at high doses only.       Objective:    BP (!) 162/71 (BP Location: Right Arm, Patient Position: Sitting, Cuff Size: Normal)   Pulse 75   Resp 16   Wt 141 lb 12.8 oz (64.3 kg)    SpO2 99%   BMI 21.56 kg/m  Wt Readings from Last 3 Encounters:  01/14/23 141 lb 12.8 oz (64.3 kg)  12/28/22 147 lb 3.2 oz (66.8 kg)  12/21/22 142 lb 6.4 oz (64.6 kg)    Physical Exam Vitals and nursing note reviewed.  Constitutional:      Appearance: He  is well-developed.  HENT:     Head: Normocephalic and atraumatic.  Cardiovascular:     Rate and Rhythm: Normal rate and regular rhythm.     Heart sounds: Normal heart sounds. No murmur heard.    No friction rub. No gallop.  Pulmonary:     Effort: Pulmonary effort is normal. No tachypnea or respiratory distress.     Breath sounds: Normal breath sounds. No decreased breath sounds, wheezing, rhonchi or rales.  Chest:     Chest wall: No tenderness.  Abdominal:     General: Bowel sounds are normal.     Palpations: Abdomen is soft.  Musculoskeletal:        General: Normal range of motion.     Cervical back: Normal range of motion.  Skin:    General: Skin is warm and dry.  Neurological:     Mental Status: He is alert and oriented to person, place, and time.     Coordination: Coordination normal.  Psychiatric:        Behavior: Behavior normal. Behavior is cooperative.        Thought Content: Thought content normal.        Judgment: Judgment normal.          Patient has been counseled extensively about nutrition and exercise as well as the importance of adherence with medications and regular follow-up. The patient was given clear instructions to go to ER or return to medical center if symptoms don't improve, worsen or new problems develop. The patient verbalized understanding.   Follow-up: Return in about 3 months (around 04/16/2023).   Claiborne Rigg, FNP-BC [facility] [place], [place] [phone]   01/14/2023, 4:45 PM

## 2023-01-15 LAB — CMP14+EGFR
ALT: 9 IU/L (ref 0–44)
AST: 14 IU/L (ref 0–40)
Albumin/Globulin Ratio: 1.4 (ref 1.2–2.2)
Albumin: 4.2 g/dL (ref 3.8–4.8)
Alkaline Phosphatase: 143 IU/L — ABNORMAL HIGH (ref 44–121)
BUN/Creatinine Ratio: 12 (ref 10–24)
BUN: 19 mg/dL (ref 8–27)
Bilirubin Total: 0.4 mg/dL (ref 0.0–1.2)
CO2: 19 mmol/L — ABNORMAL LOW (ref 20–29)
Calcium: 9 mg/dL (ref 8.6–10.2)
Chloride: 104 mmol/L (ref 96–106)
Creatinine, Ser: 1.56 mg/dL — ABNORMAL HIGH (ref 0.76–1.27)
Globulin, Total: 3 g/dL (ref 1.5–4.5)
Glucose: 99 mg/dL (ref 70–99)
Potassium: 3.8 mmol/L (ref 3.5–5.2)
Sodium: 143 mmol/L (ref 134–144)
Total Protein: 7.2 g/dL (ref 6.0–8.5)
eGFR: 45 mL/min/{1.73_m2} — ABNORMAL LOW (ref >59)

## 2023-01-15 LAB — CBC WITH DIFFERENTIAL/PLATELET
Basophils Absolute: 0 10*3/uL (ref 0.0–0.2)
Basos: 1 %
EOS (ABSOLUTE): 0 10*3/uL (ref 0.0–0.4)
Eos: 1 %
Hematocrit: 33.7 % — ABNORMAL LOW (ref 37.5–51.0)
Hemoglobin: 10.5 g/dL — ABNORMAL LOW (ref 13.0–17.7)
Immature Grans (Abs): 0 10*3/uL (ref 0.0–0.1)
Immature Granulocytes: 0 %
Lymphocytes Absolute: 2.5 10*3/uL (ref 0.7–3.1)
Lymphs: 48 %
MCH: 27.9 pg (ref 26.6–33.0)
MCHC: 31.2 g/dL — ABNORMAL LOW (ref 31.5–35.7)
MCV: 89 fL (ref 79–97)
Monocytes Absolute: 0.5 10*3/uL (ref 0.1–0.9)
Monocytes: 9 %
Neutrophils Absolute: 2.1 10*3/uL (ref 1.4–7.0)
Neutrophils: 41 %
Platelets: 183 10*3/uL (ref 150–450)
RBC: 3.77 x10E6/uL — ABNORMAL LOW (ref 4.14–5.80)
RDW: 13.6 % (ref 11.6–15.4)
WBC: 5.1 10*3/uL (ref 3.4–10.8)

## 2023-01-16 ENCOUNTER — Ambulatory Visit: Payer: Self-pay | Admitting: General Practice

## 2023-01-17 NOTE — Progress Notes (Deleted)
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 Year: Often true     Ran Out of Food in the Last Year: Often true  Transportation Needs: No Transportation Needs (12/15/2022)    PRAPARE - Therapist, art (Medical): No     Lack of Transportation (Non-Medical): No  Physical Activity: Not on file  Stress: Not on file  Social Connections: Not on file  Intimate Partner Violence: Not At Risk (12/15/2022)    Humiliation, Afraid, Rape, and Kick questionnaire     Fear of Current or Ex-Partner: No     Emotionally Abused: No     Physically Abused: No     Sexually Abused: No      Review of Systems    General:  No chills, fever, night sweats or weight changes.  Cardiovascular:  No chest pain, dyspnea on exertion, edema, orthopnea, palpitations, paroxysmal nocturnal dyspnea. Dermatological: No rash, lesions/masses Respiratory: No cough, dyspnea Urologic: No hematuria, dysuria Abdominal:   No nausea, vomiting, diarrhea, bright red blood per rectum, melena, or hematemesis Neurologic: Significant visual changes, unable to see clearly, read medication bottles, wkns, changes in mental status. All other systems reviewed and are otherwise negative  except as noted above.       Cardiac Rehabilitation Eligibility Assessment  The patient is NOT ready to start cardiac rehabilitation due to: Other     Physical Exam    VS:  BP 136/86   Pulse 71   Ht 5\' 8"  (1.727 m)   Wt 142 lb 6.4 oz (64.6 kg)   SpO2 94%   BMI 21.65  kg/m  , BMI Body mass index is 21.65 kg/m.     GEN: Well nourished, well developed, in no acute distress. HEENT: normal.  Wearing glasses, but states he is unable to read or see clearly Neck: Supple, no JVD, carotid bruits, or masses. Cardiac: RRR, no murmurs, rubs, or gallops. No clubbing, cyanosis, edema.  Radials/DP/PT 2+ and equal bilaterally.  Respiratory:  Respirations regular and unlabored, clear to auscultation bilaterally. GI: Soft, nontender, nondistended, BS + x 4. MS: no deformity or atrophy.  Chronic pain with inspiration and palpation. Skin: warm and dry, no rash. Neuro:  Strength and sensation are intact. Psych: Normal affect.   Accessory Clinical Findings    ECG personally reviewed by me today-not completed this office visit-  Recent Labs       Lab Results  Component Value Date    WBC 6.6 12/18/2022    HGB 8.5 (L) 12/18/2022    HCT 25.5 (L) 12/18/2022    MCV 86.7 12/18/2022    PLT 220 12/18/2022      Recent Labs       Lab Results  Component Value Date    CREATININE 2.58 (H) 12/18/2022    BUN 32 (H) 12/18/2022    NA 135 12/18/2022    K 4.2 12/18/2022    CL 107 12/18/2022    CO2 21 (L) 12/18/2022      Recent Labs       Lab Results  Component Value Date    ALT 31 12/14/2022    AST 69 (H) 12/14/2022    ALKPHOS 88 12/14/2022    BILITOT 1.3 (H) 12/14/2022      Recent Labs       Lab Results  Component Value Date    CHOL 166 12/12/2022    HDL 48 12/12/2022    LDLCALC 94 12/12/2022    LDLDIRECT 132.9 06/18/2013    TRIG 120 12/12/2022    CHOLHDL 3.5 12/12/2022      Recent Labs       Lab Results  Component Value Date    HGBA1C 6.1 (H) 12/12/2022        Review of  Prior Studies:   12/10/22 LHC: 3V CAD - 75% mid LAD segemental stenosis with RFR 0.84, - 100% CTO of the first OM with left to left collaterals.  - 80% proximal RCA, 75% mid RCA in stent, 100% PLOM with left to right collaterals. Mild to moderate LV dysfunction. EF estimated at 45% Mildly elevated LVEDP.=> IVUS-Guided DES PCI mLAD w/ 3.5 x 38 Synergy  XD (post dilated to 4.0)   12/12/22 LHC w/ Staged PCI of RCA: Successful stenting of the proximal and mid RCA with 2 overlapping SYNERGY XD 3.5 x 38 mm DES but with resultant no reflow due to poor distal vessel runoff            COMPLICATED BY Type IV a MI   Diagnostic 4/1                                                                       Intervention (4/1-LAD, 4/3 RCA)    12/11/22 TTE: LVEF reduced to 39.5% with global HK.  Severe LVH.  GR 1 DD.  Global HK.  Moderate RV  wall thickness but unable to assess PAP.  Mild LA dilation.  Aortic valve calcification but no stenosis.  Consider outpatient CMR for  chacterization of etiology of LV hypertrophy.  May 2023: EF 35%.  Basal to mid inferolateral anterolateral severe HK PAK.  Severe LVH.  Mild LA dilation.   12/14/22 Limited TTE (after Type IV1a MI): EF ~50-55%. RWMA: Basal Inferior Dyskinesis, Basal-mid Inferolateral Akinesis. Gr 1 DD. ~ Normal RV. AoV Sclerossis w/o Stenosis. Normal RAP . A device lead is visualized.    Assessment & Plan   1.  ***     {Are you ordering a CV Procedure (e.g. stress test, cath, DCCV, TEE, etc)?   Press F2        :[id number]}   Signed, Bettey Mare. Liborio Nixon, ANP, AACC   01/17/2023 12:49 PM      Office [phone] Fax [phone]  Notice: This dictation was prepared with Dragon dictation along with smaller phrase technology. Any transcriptional errors that result from this process are unintentional and may not be corrected upon review.

## 2023-01-18 ENCOUNTER — Ambulatory Visit: Payer: Self-pay | Attending: Adult Health | Admitting: Adult Health

## 2023-01-18 ENCOUNTER — Telehealth: Payer: Self-pay

## 2023-01-18 NOTE — Telephone Encounter (Signed)
Called patient regarding appointment reminder. And to advise to bring family member to appointment witn K. Lawrence. Left message for patient to call office.

## 2023-01-21 ENCOUNTER — Telehealth: Payer: Self-pay

## 2023-01-21 NOTE — Telephone Encounter (Signed)
Called patient. Left message for patient to call office.

## 2023-01-25 ENCOUNTER — Other Ambulatory Visit: Payer: Self-pay

## 2023-02-05 ENCOUNTER — Ambulatory Visit: Payer: Self-pay | Admitting: Nurse Practitioner

## 2023-02-06 ENCOUNTER — Ambulatory Visit (HOSPITAL_COMMUNITY)
Admission: RE | Admit: 2023-02-06 | Discharge: 2023-02-06 | Disposition: A | Discharge status: Home / Self Care | Payer: Self-pay | Source: Ambulatory Visit | Attending: Cardiology | Admitting: Cardiology

## 2023-02-06 ENCOUNTER — Other Ambulatory Visit: Payer: Self-pay

## 2023-02-06 ENCOUNTER — Encounter (HOSPITAL_COMMUNITY): Payer: Self-pay

## 2023-02-06 VITALS — BP 158/80 | HR 77 | Wt 142.0 lb

## 2023-02-06 DIAGNOSIS — Z7902 Long term (current) use of antithrombotics/antiplatelets: Secondary | ICD-10-CM | POA: Diagnosis not present

## 2023-02-06 DIAGNOSIS — R5383 Other fatigue: Secondary | ICD-10-CM | POA: Diagnosis not present

## 2023-02-06 DIAGNOSIS — R0600 Dyspnea, unspecified: Secondary | ICD-10-CM | POA: Insufficient documentation

## 2023-02-06 DIAGNOSIS — I255 Ischemic cardiomyopathy: Secondary | ICD-10-CM | POA: Insufficient documentation

## 2023-02-06 DIAGNOSIS — E785 Hyperlipidemia, unspecified: Secondary | ICD-10-CM | POA: Diagnosis not present

## 2023-02-06 DIAGNOSIS — N179 Acute kidney failure, unspecified: Secondary | ICD-10-CM | POA: Diagnosis not present

## 2023-02-06 DIAGNOSIS — I25119 Atherosclerotic heart disease of native coronary artery with unspecified angina pectoris: Secondary | ICD-10-CM | POA: Insufficient documentation

## 2023-02-06 DIAGNOSIS — D649 Anemia, unspecified: Secondary | ICD-10-CM | POA: Diagnosis not present

## 2023-02-06 DIAGNOSIS — I472 Ventricular tachycardia, unspecified: Secondary | ICD-10-CM | POA: Diagnosis not present

## 2023-02-06 DIAGNOSIS — I5042 Chronic combined systolic (congestive) and diastolic (congestive) heart failure: Secondary | ICD-10-CM | POA: Insufficient documentation

## 2023-02-06 DIAGNOSIS — Z955 Presence of coronary angioplasty implant and graft: Secondary | ICD-10-CM | POA: Diagnosis not present

## 2023-02-06 DIAGNOSIS — I110 Hypertensive heart disease with heart failure: Secondary | ICD-10-CM | POA: Diagnosis not present

## 2023-02-06 DIAGNOSIS — I480 Paroxysmal atrial fibrillation: Secondary | ICD-10-CM | POA: Insufficient documentation

## 2023-02-06 DIAGNOSIS — Z7982 Long term (current) use of aspirin: Secondary | ICD-10-CM | POA: Diagnosis not present

## 2023-02-06 DIAGNOSIS — Z79899 Other long term (current) drug therapy: Secondary | ICD-10-CM | POA: Diagnosis not present

## 2023-02-06 LAB — CBC
HCT: 29.1 % — ABNORMAL LOW (ref 39.0–52.0)
Hemoglobin: 9.2 g/dL — ABNORMAL LOW (ref 13.0–17.0)
MCH: 27.5 pg (ref 26.0–34.0)
MCHC: 31.6 g/dL (ref 30.0–36.0)
MCV: 87.1 fL (ref 80.0–100.0)
Platelets: 170 10*3/uL (ref 150–400)
RBC: 3.34 MIL/uL — ABNORMAL LOW (ref 4.22–5.81)
RDW: 14.6 % (ref 11.5–15.5)
WBC: 5 10*3/uL (ref 4.0–10.5)
nRBC: 0 % (ref 0.0–0.2)

## 2023-02-06 LAB — COMPREHENSIVE METABOLIC PANEL
ALT: 10 U/L (ref 0–44)
AST: 17 U/L (ref 15–41)
Albumin: 3.8 g/dL (ref 3.5–5.0)
Alkaline Phosphatase: 96 U/L (ref 38–126)
Anion gap: 9 (ref 5–15)
BUN: 16 mg/dL (ref 8–23)
CO2: 25 mmol/L (ref 22–32)
Calcium: 8.6 mg/dL — ABNORMAL LOW (ref 8.9–10.3)
Chloride: 103 mmol/L (ref 98–111)
Creatinine, Ser: 1.31 mg/dL — ABNORMAL HIGH (ref 0.61–1.24)
GFR, Estimated: 55 mL/min — ABNORMAL LOW (ref >60)
Glucose, Bld: 80 mg/dL (ref 70–99)
Potassium: 3.6 mmol/L (ref 3.5–5.1)
Sodium: 137 mmol/L (ref 135–145)
Total Bilirubin: 0.5 mg/dL (ref 0.3–1.2)
Total Protein: 7.1 g/dL (ref 6.5–8.1)

## 2023-02-06 LAB — IRON AND TIBC
Iron: 37 ug/dL — ABNORMAL LOW (ref 45–182)
Saturation Ratios: 12 % — ABNORMAL LOW (ref 17.9–39.5)
TIBC: 301 ug/dL (ref 250–450)
UIBC: 264 ug/dL

## 2023-02-06 LAB — FERRITIN: Ferritin: 63 ng/mL (ref 24–336)

## 2023-02-06 LAB — BRAIN NATRIURETIC PEPTIDE: B Natriuretic Peptide: 688.8 pg/mL — ABNORMAL HIGH (ref 0.0–100.0)

## 2023-02-06 MED ORDER — HYDRALAZINE HCL 25 MG PO TABS
25.0000 mg | ORAL_TABLET | Freq: Three times a day (TID) | ORAL | 1 refills | Status: DC
  Filled 2023-02-06: qty 90, 30d supply, fill #0

## 2023-02-06 MED ORDER — ISOSORBIDE MONONITRATE ER 30 MG PO TB24
15.0000 mg | ORAL_TABLET | Freq: Every day | ORAL | 1 refills | Status: DC
  Filled 2023-02-06: qty 15, 30d supply, fill #0

## 2023-02-06 MED ORDER — POTASSIUM CHLORIDE CRYS ER 10 MEQ PO TBCR
10.0000 meq | EXTENDED_RELEASE_TABLET | Freq: Every day | ORAL | 1 refills | Status: DC
  Filled 2023-02-06: qty 30, 30d supply, fill #0

## 2023-02-06 MED ORDER — TORSEMIDE 20 MG PO TABS
20.0000 mg | ORAL_TABLET | Freq: Every day | ORAL | 1 refills | Status: DC
  Filled 2023-02-06: qty 30, 30d supply, fill #0
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
## 2023-02-06 NOTE — Patient Instructions (Signed)
Labs done today. We will contact you only if your labs are abnormal.  START Torsemide 20mg  (1 tablet) by mouth daily.   START Potassium 54meq(1 tablet) by mouth daily.  START Imdur 15mg  (1/2 tablet) by mouth daily.   START Hydralazine 25mg  (1 tablet) by mouth 3 times daily.   No other medication changes were made. Please continue all current medications as prescribed.  Your physician recommends that you schedule a follow-up appointment in: 1 week  If you have any questions or concerns before your next appointment please send [place] a message through [place] or call our office at [phone].    TO LEAVE A MESSAGE FOR THE NURSE SELECT OPTION 2, PLEASE LEAVE A MESSAGE INCLUDING: YOUR NAME DATE OF BIRTH CALL BACK NUMBER REASON FOR CALL**this is important as we prioritize the call backs  YOU WILL RECEIVE A CALL BACK THE SAME DAY AS LONG AS YOU CALL BEFORE 4:00 PM   Do the following things EVERYDAY: Weigh yourself in the morning before breakfast. Write it down and keep it in a log. Take your medicines as prescribed Eat low salt foods--Limit salt (sodium) to 2000 mg per day.  Stay as active as you can everyday Limit all fluids for the day to less than 2 liters   At the [facility], you and your health needs are our priority. As part of our continuing mission to provide you with exceptional heart care, we have created designated Provider Care Teams. These Care Teams include your primary Cardiologist (physician) and Advanced Practice Providers (APPs- Physician Assistants and Nurse Practitioners) who all work together to provide you with the care you need, when you need it.   You may see any of the following providers on your designated Care Team at your next follow up: Dr Arvilla Meres Dr Marca Ancona Dr. Marcos Eke, NP Robbie Lis, [facility] [place], [place] Brynda Peon, NP Karle Plumber, PharmD   Please be sure to bring in all  your medications bottles to every appointment.    Thank you for choosing [facility]

## 2023-02-08 ENCOUNTER — Ambulatory Visit (HOSPITAL_COMMUNITY): Payer: Self-pay

## 2023-02-08 ENCOUNTER — Ambulatory Visit (INDEPENDENT_AMBULATORY_CARE_PROVIDER_SITE_OTHER): Payer: Self-pay

## 2023-02-08 DIAGNOSIS — I255 Ischemic cardiomyopathy: Secondary | ICD-10-CM

## 2023-02-08 LAB — CUP PACEART REMOTE DEVICE CHECK
Battery Remaining Longevity: 48 mo
Battery Voltage: 2.96 V
Brady Statistic AP VP Percent: 73.62 %
Brady Statistic AP VS Percent: 0.17 %
Brady Statistic AS VP Percent: 26 %
Brady Statistic AS VS Percent: 0.21 %
Brady Statistic RA Percent Paced: 70.78 %
Brady Statistic RV Percent Paced: 97.81 %
Date Time Interrogation Session: 20240531033323
HighPow Impedance: 64 Ohm
Implantable Lead Connection Status: 753985
Implantable Lead Connection Status: 753985
Implantable Lead Connection Status: 753985
Implantable Lead Implant Date: 20151203
Implantable Lead Implant Date: 20151203
Implantable Lead Implant Date: 20151203
Implantable Lead Location: 753858
Implantable Lead Location: 753859
Implantable Lead Location: 753860
Implantable Lead Model: 4598
Implantable Lead Model: 5076
Implantable Lead Model: 6935
Implantable Pulse Generator Implant Date: 20221129
Lead Channel Impedance Value: 160.941
Lead Channel Impedance Value: 160.941
Lead Channel Impedance Value: 165.029
Lead Channel Impedance Value: 175.622
Lead Channel Impedance Value: 175.622
Lead Channel Impedance Value: 228 Ohm
Lead Channel Impedance Value: 285 Ohm
Lead Channel Impedance Value: 304 Ohm
Lead Channel Impedance Value: 342 Ohm
Lead Channel Impedance Value: 342 Ohm
Lead Channel Impedance Value: 361 Ohm
Lead Channel Impedance Value: 361 Ohm
Lead Channel Impedance Value: 361 Ohm
Lead Channel Impedance Value: 532 Ohm
Lead Channel Impedance Value: 532 Ohm
Lead Channel Impedance Value: 589 Ohm
Lead Channel Impedance Value: 589 Ohm
Lead Channel Impedance Value: 608 Ohm
Lead Channel Pacing Threshold Amplitude: 0.625 V
Lead Channel Pacing Threshold Amplitude: 1.125 V
Lead Channel Pacing Threshold Amplitude: 1.25 V
Lead Channel Pacing Threshold Pulse Width: 0.4 ms
Lead Channel Pacing Threshold Pulse Width: 0.4 ms
Lead Channel Pacing Threshold Pulse Width: 0.6 ms
Lead Channel Sensing Intrinsic Amplitude: 1 mV
Lead Channel Sensing Intrinsic Amplitude: 1 mV
Lead Channel Sensing Intrinsic Amplitude: 23.25 mV
Lead Channel Sensing Intrinsic Amplitude: 23.25 mV
Lead Channel Setting Pacing Amplitude: 1.5 V
Lead Channel Setting Pacing Amplitude: 2.25 V
Lead Channel Setting Pacing Amplitude: 3.5 V
Lead Channel Setting Pacing Pulse Width: 0.4 ms
Lead Channel Setting Pacing Pulse Width: 0.6 ms
Lead Channel Setting Sensing Sensitivity: 0.3 mV
Zone Setting Status: 755011
Zone Setting Status: 755011

## 2023-02-15 NOTE — Progress Notes (Deleted)
Cardiology Clinic Note   Patient Name: [name] Date of Encounter: 02/15/2023  Primary Care Provider:  Claiborne Rigg, NP Primary Cardiologist:  Peter Swaziland, MD Advanced Heart Failure Cardiologist: Dr.Bensimhon EP: Dr. Gerre Pebbles   Patient Profile    82 year old male with history of coronary artery disease, ischemic cardiomyopathy with HFrEF, history of CRT-D, hypertension, PAF (not on anticoagulation per EP), and hyperlipidemia.  The patient did have history of VT requiring ATP, he was started on amiodarone.  LHC 12/10/22 demonstrated 75% m LAD (RFR 0.84), CTO OM1 with left to left collaterals, 80% p RCA, 75% m RCA stent and 100% PLOM with left to left collaterals. He underwent successful PCI of mLAD with IVUS and DES. He later underwent staged PCI/placement of 2 overlapping DES in mid to distal RCA. Post intervention had no reflow w/ distal embolization c/b MI with troponin elevation up to 9,000. Medical management recommended. Echo during admit EF 30-35%. Limited echo 12/15/22: EF 35-40%, hypokinesis inferior and inferolateral wall, RV severely reduced. He saw AHF clinic and was restarted on torsemide 20 mg daily, Imdur 15 mg daily, hydralazine 25 mg TID. Weight on last visit with AHF clinic 142 lbs. 02/06/2023.   Past Medical History    Past Medical History:  Diagnosis Date   Acute renal failure (HCC) 05/31/2014   Angina decubitus 06/15/2014   CAD (coronary artery disease)    a. s/p stent RCA DES 9/09; b. 2014 Attempted PCI of OM1 @ [place];  c. 04/2014 Cath: LAD 40-50p, D1 95-99 (chronic), LCX 30-40 inf branch, OM1 CTO, RCA 30-40p, RCA patent stent, EF 35%->Med Rx.   Chronic combined systolic and diastolic CHF (congestive heart failure) (HCC)    a. EF about 40 to 45% per echo in April 2013;  b. 04/2014 Echo: EF 20-25%, sev LVH, sev glob HK, Gr 1 DD, mildly reduced RV fxn, PASP . c. 01/2017: EF improved to 50-55%.    Chronic low back pain    Complete heart block (HCC)     COPD (chronic obstructive pulmonary disease) (HCC) dx 06/2013   PFTs 07/08/13: mod obst with resp to bronchodilator, moderate decrease diffusion, airtrapping   COPD with acute exacerbation (HCC) 01/17/2017   COPD with asthma    07/08/13 PFT: FEV1 1.74L (66% pred, 30% change with BD), mod obst with resp to bronchodilator, moderate decrease diffusion, air-trapping 11/2013 Simple spiro>> clear obstruction, FEV1 1.30 L (47% pred) - trial of symbicort 160 2bid 01/26/15      Depression 09/15/2018   Assessment: Increased sadness and depression since losing job  Plan: Patient denies suicidal and homicidal ideations Patient would not like to start medication therapies at this time Will establish patient with [facility] for primary care   Dyslipidemia    a. on statin   Essential hypertension    HLD (hyperlipidemia)    HTN (hypertension)    a. Reports intolerance to hydralazine; b. no beta blockers 2/2 bradycardia;  c. failed on ACE and ARB.   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 04/2014 Echo: EF 20-25%, sev glob HK.   Mixed Ischemic/Non-Ischemic Cardiomyopathy    probable mixed ischemic and non-ischemic    Noncompliance    OSA (obstructive sleep apnea) 04/18/2018   06/11/16 - home sleep study shows AHI of 2.9 an hour with the lowest SaO2 of 79%  with an average of 93%  03/08/2018-Home sleep study-AHI 7/HR, SaO2 low 81%    Paroxysmal atrial fibrillation (HCC)    a. identified on device interrogation 01/2016   Pneumonia 09/02/2013   Pulmonary edema 05/10/2014   Respiratory arrest (HCC) 05/18/2014   Sinus bradycardia 05/31/2014   Ventricular tachycardia (HCC)    treated with ATP,  CL 250-300 msec   Past Surgical History:  Procedure Laterality Date   BI-VENTRICULAR IMPLANTABLE CARDIOVERTER DEFIBRILLATOR N/A 08/12/2014   MDT Ovidio Kin XT CRTD implanted by Dr Johney Frame   BIV ICD GENERATOR CHANGEOUT N/A 08/08/2021   Procedure: BIV ICD GENERATOR CHANGEOUT;  Surgeon: Hillis Range, MD;  Location: [facility] INVASIVE CV LAB;  Service: Cardiovascular;  Laterality: N/A;   CARDIAC CATHETERIZATION     ejection fraction 50%   CORONARY STENT INTERVENTION N/A 12/10/2022   Procedure: CORONARY STENT INTERVENTION;  Surgeon: Swaziland, Peter M, MD;  Location: [facility] INVASIVE CV LAB;  Service: Cardiovascular;  Laterality: N/A;   CORONARY STENT INTERVENTION N/A 12/12/2022   Procedure: CORONARY STENT INTERVENTION;  Surgeon: Swaziland, Peter M, MD;  Location: [facility] INVASIVE CV LAB;  Service: Cardiovascular;  Laterality: N/A;   CORONARY ULTRASOUND/IVUS N/A 12/10/2022   Procedure: Intravascular Ultrasound/IVUS;  Surgeon: Swaziland, Peter M, MD;  Location: [facility] INVASIVE CV LAB;  Service: Cardiovascular;  Laterality: N/A;   CORONARY ULTRASOUND/IVUS N/A 12/12/2022   Procedure: Coronary Ultrasound/IVUS;  Surgeon: Swaziland, Peter M, MD;  Location: [facility] INVASIVE CV LAB;  Service: Cardiovascular;  Laterality: N/A;   LEFT HEART CATH AND CORONARY ANGIOGRAPHY N/A 12/10/2022   Procedure: LEFT HEART CATH AND CORONARY ANGIOGRAPHY;  Surgeon: Swaziland, Peter M, MD;  Location: [facility] INVASIVE CV LAB;  Service: Cardiovascular;  Laterality: N/A;   LEFT HEART CATHETERIZATION WITH CORONARY ANGIOGRAM N/A 05/08/2014   Procedure: LEFT HEART CATHETERIZATION WITH CORONARY ANGIOGRAM;  Surgeon: Micheline Chapman, MD;  Location: [facility] CATH LAB;  Service: Cardiovascular;  Laterality: N/A;    Allergies  Allergies  Allergen Reactions   Acyclovir And Related     GI upset    Aspirin Other (See Comments)    GI upset at high doses only.    History of Present Illness     Mr. Krenke returns today for ongoing assessment and management of coronary artery disease, HFrEF, being followed by advanced heart failure clinic, most recent EF 35 to 40%, history of noncompliance with medications reintroduced on last visit to heart  failure clinic as listed above, hypertension, hyperlipidemia. Home Medications    Current Outpatient Medications  Medication Sig Dispense Refill   acetaminophen (TYLENOL) 650 MG CR tablet Take 650 mg by mouth every 8 (eight) hours as needed for pain.     albuterol (PROVENTIL) (2.5 MG/3ML) 0.083% nebulizer solution Take 3 mLs (2.5 mg total) by nebulization every 6 (six) hours as needed for wheezing or shortness of breath. 180 mL 1   albuterol (VENTOLIN HFA) 108 (90 Base) MCG/ACT inhaler Inhale 1-2 puffs into the lungs every 6 (six) hours as needed for wheezing or shortness of breath. 18 g 1   aspirin EC 81 MG tablet Take 1 tablet (81 mg total) by mouth daily. Swallow whole. 30 tablet 11   atorvastatin (LIPITOR) 80 MG tablet Take 1 tablet (80 mg total) by mouth once daily. 90 tablet 3   famotidine (PEPCID) 40 MG tablet Take 1 tablet (40 mg total) by mouth daily. 90 tablet 1   Glycopyrrolate-Formoterol (BEVESPI AEROSPHERE) 9-4.8 MCG/ACT AERO Inhale 2 puffs into the lungs 2 (two) times daily. NEEDS  PASS 10.7 g 11   hydrALAZINE (APRESOLINE) 25 MG tablet Take 1 tablet (25 mg total) by mouth 3 (three) times daily. 90 tablet 1   ipratropium-albuterol (DUONEB) 0.5-2.5 (3) MG/3ML SOLN Take 3 mLs by nebulization every 6 (six) hours as needed (wheezing or shortness of breath). 360 mL 0   isosorbide mononitrate (IMDUR) 30 MG 24 hr tablet Take 0.5 tablets (15 mg total) by mouth daily. 15 tablet 1   metoprolol (TOPROL-XL) 200 MG 24 hr tablet Take 1 tablet (200 mg total) by mouth daily. Take with or immediately following a meal. 30 tablet 2   mometasone-formoterol (DULERA) 200-5 MCG/ACT AERO Inhale 2 puffs into the lungs 2 (two) times daily. 13 g 6   nitroGLYCERIN (NITROSTAT) 0.4 MG SL tablet Place 1 tablet (0.4 mg total) under the tongue every 5 (five) minutes as needed for chest pain. 25 tablet 3   potassium chloride (KLOR-CON M) 10 MEQ tablet Take 1 tablet (10 mEq total) by mouth daily. 30 tablet 1    ranolazine (RANEXA) 500 MG 12 hr tablet Take 1 tablet (500 mg total) by mouth 2 (two) times daily. 60 tablet 2   ticagrelor (BRILINTA) 90 MG TABS tablet Take 1 tablet (90 mg total) by mouth 2 (two) times daily. 60 tablet 5   torsemide (DEMADEX) 20 MG tablet Take 1 tablet (20 mg total) by mouth daily. 30 tablet 1   No current facility-administered medications for this visit.     Family History    Family History  Problem Relation Age of Onset   Heart attack Neg Hx    Stroke Neg Hx    He indicated that his mother is deceased. He indicated that his father is deceased. He indicated that the status of his neg hx is unknown.  Social History    Social History   Socioeconomic History   Marital status: Married    Spouse name: Not on file   Number of children: 3   Years of education: Not on file   Highest education level: Not on file  Occupational History   Occupation: security guard.  Tobacco Use   Smoking status: Former    Packs/day: 0.50    Years: 56.00    Additional pack years: 0.00    Total pack years: 28.00    Types: Cigarettes    Quit date: 09/10/2012    Years since quitting: 10.4   Smokeless tobacco: Never   Tobacco comments:    tried 1 month ago (05/2018) but didn't like it   Vaping Use   Vaping Use: Never used  Substance and Sexual Activity   Alcohol use: No   Drug use: No   Sexual activity: Not Currently  Other Topics Concern   Not on file  Social History Narrative          Social Determinants of Health   Financial Resource Strain: High Risk (12/13/2022)   Overall Financial Resource Strain (CARDIA)    Difficulty of Paying Living Expenses: Hard  Food Insecurity: No Food Insecurity (12/15/2022)   Hunger Vital Sign    Worried About Running Out of Food in the Last Year: Never true    Ran Out of Food in the Last Year: Never true  Recent Concern: Food Insecurity - Food Insecurity Present (12/10/2022)   Hunger Vital Sign    Worried About Running Out of Food in the Last  Year: Often true    Ran Out of Food in the Last Year: Often true  Transportation Needs:  No Transportation Needs (12/15/2022)   PRAPARE - Administrator, Civil Service (Medical): No    Lack of Transportation (Non-Medical): No  Physical Activity: Not on file  Stress: Not on file  Social Connections: Not on file  Intimate Partner Violence: Not At Risk (12/15/2022)   Humiliation, Afraid, Rape, and Kick questionnaire    Fear of Current or Ex-Partner: No    Emotionally Abused: No    Physically Abused: No    Sexually Abused: No     Review of Systems    General:  No chills, fever, night sweats or weight changes.  Cardiovascular:  No chest pain, dyspnea on exertion, edema, orthopnea, palpitations, paroxysmal nocturnal dyspnea. Dermatological: No rash, lesions/masses Respiratory: No cough, dyspnea Urologic: No hematuria, dysuria Abdominal:   No nausea, vomiting, diarrhea, bright red blood per rectum, melena, or hematemesis Neurologic:  No visual changes, wkns, changes in mental status. All other systems reviewed and are otherwise negative except as noted above.     Physical Exam    VS:  There were no vitals taken for this visit. , BMI There is no height or weight on file to calculate BMI.     GEN: Well nourished, well developed, in no acute distress. HEENT: normal. Neck: Supple, no JVD, carotid bruits, or masses. Cardiac: RRR, no murmurs, rubs, or gallops. No clubbing, cyanosis, edema.  Radials/DP/PT 2+ and equal bilaterally.  Respiratory:  Respirations regular and unlabored, clear to auscultation bilaterally. GI: Soft, nontender, nondistended, BS + x 4. MS: no deformity or atrophy. Skin: warm and dry, no rash. Neuro:  Strength and sensation are intact. Psych: Normal affect.  Accessory Clinical Findings    ECG personally reviewed by me today- *** - No acute changes  Lab Results  Component Value Date   WBC 5.0 02/06/2023   HGB 9.2 (L) 02/06/2023   HCT 29.1 (L)  02/06/2023   MCV 87.1 02/06/2023   PLT 170 02/06/2023   Lab Results  Component Value Date   CREATININE 1.31 (H) 02/06/2023   BUN 16 02/06/2023   NA 137 02/06/2023   K 3.6 02/06/2023   CL 103 02/06/2023   CO2 25 02/06/2023   Lab Results  Component Value Date   ALT 10 02/06/2023   AST 17 02/06/2023   ALKPHOS 96 02/06/2023   BILITOT 0.5 02/06/2023   Lab Results  Component Value Date   CHOL 166 12/12/2022   HDL 48 12/12/2022   LDLCALC 94 12/12/2022   LDLDIRECT 132.9 06/18/2013   TRIG 120 12/12/2022   CHOLHDL 3.5 12/12/2022    Lab Results  Component Value Date   HGBA1C 6.1 (H) 12/12/2022    Review of Prior Studies: Limited Echocardiogram 12/15/2022  1. Limited study to assess LV function; hypokinesis of the inferior,  inferolateral wall with moderate LV dysfunction.   2. Left ventricular ejection fraction, by estimation, is 35 to 40%. The  left ventricle has moderately decreased function. The left ventricle  demonstrates regional wall motion abnormalities (see scoring  diagram/findings for description). There is  moderate left ventricular hypertrophy.   3. Right ventricular systolic function is severely reduced. The right  ventricular size is normal. There is normal pulmonary artery systolic  pressure.   4. The mitral valve is normal in structure. Mild mitral valve  regurgitation. No evidence of mitral stenosis.   5. The aortic valve is tricuspid. Aortic valve regurgitation is trivial.  Aortic valve sclerosis is present, with no evidence of aortic valve  stenosis.  6. The inferior vena cava is normal in size with greater than 50%  respiratory variability, suggesting right atrial pressure of 3 mmHg.   LHC with Intervention 12/12/2022 Prox RCA lesion is 80% stenosed.   Prox RCA to Mid RCA lesion is 75% stenosed.   Ramus lesion is 40% stenosed.   1st Mrg lesion is 100% stenosed.   RPAV lesion is 100% stenosed.   Non-stenotic Prox LAD to Mid LAD lesion was previously  treated.   A drug-eluting stent was successfully placed using a SYNERGY XD 3.50X38.   A drug-eluting stent was successfully placed using a STENT ONYX FRONTIER 3.5X38.   Post intervention, there is a 0% residual stenosis.   Post intervention, there is a 0% residual stenosis.   1. Successful stenting of the proximal and mid RCA with overlapping 3.5 x 38 mm DES but with resultant no reflow due to poor distal vessel runoff   Plan: Brilinta monotherapy indefinitely due to ASA intolerance. Will monitor in ICU. Check troponins. IV Ntg and analgesics for pain.    Assessment & Plan   1.  ***     {Are you ordering a CV Procedure (e.g. stress test, cath, DCCV, TEE, etc)?   Press F2        :[id number]}   Signed, Bettey Mare. Liborio Nixon, ANP, AACC   02/15/2023 12:25 PM      Office [phone] Fax [phone]  Notice: This dictation was prepared with Dragon dictation along with smaller phrase technology. Any transcriptional errors that result from this process are unintentional and may not be corrected upon review.

## 2023-02-18 ENCOUNTER — Ambulatory Visit (HOSPITAL_COMMUNITY): Payer: Self-pay

## 2023-02-26 NOTE — Progress Notes (Signed)
Remote ICD transmission.   

## 2023-02-27 ENCOUNTER — Other Ambulatory Visit: Payer: Self-pay

## 2023-02-27 ENCOUNTER — Other Ambulatory Visit: Payer: Self-pay | Admitting: Nurse Practitioner

## 2023-02-27 DIAGNOSIS — I10 Essential (primary) hypertension: Secondary | ICD-10-CM

## 2023-02-28 ENCOUNTER — Ambulatory Visit: Payer: Self-pay

## 2023-02-28 ENCOUNTER — Emergency Department (HOSPITAL_COMMUNITY): Payer: Self-pay

## 2023-02-28 ENCOUNTER — Ambulatory Visit: Payer: No Typology Code available for payment source | Admitting: Adult Health

## 2023-02-28 ENCOUNTER — Emergency Department (HOSPITAL_COMMUNITY)
Admission: EM | Admit: 2023-02-28 | Discharge: 2023-02-28 | Disposition: A | Discharge status: Home / Self Care | Payer: Self-pay | Attending: Emergency Medicine | Admitting: Emergency Medicine

## 2023-02-28 ENCOUNTER — Encounter (HOSPITAL_COMMUNITY): Payer: Self-pay

## 2023-02-28 ENCOUNTER — Other Ambulatory Visit: Payer: Self-pay

## 2023-02-28 ENCOUNTER — Encounter: Payer: Self-pay | Admitting: Pharmacist

## 2023-02-28 DIAGNOSIS — E860 Dehydration: Secondary | ICD-10-CM | POA: Insufficient documentation

## 2023-02-28 DIAGNOSIS — I5042 Chronic combined systolic (congestive) and diastolic (congestive) heart failure: Secondary | ICD-10-CM | POA: Insufficient documentation

## 2023-02-28 DIAGNOSIS — Z7982 Long term (current) use of aspirin: Secondary | ICD-10-CM | POA: Insufficient documentation

## 2023-02-28 DIAGNOSIS — N179 Acute kidney failure, unspecified: Secondary | ICD-10-CM | POA: Insufficient documentation

## 2023-02-28 LAB — CBC WITH DIFFERENTIAL/PLATELET
Abs Immature Granulocytes: 0.01 10*3/uL (ref 0.00–0.07)
Basophils Absolute: 0 10*3/uL (ref 0.0–0.1)
Basophils Relative: 0 %
Eosinophils Absolute: 0 10*3/uL (ref 0.0–0.5)
Eosinophils Relative: 1 %
HCT: 34.9 % — ABNORMAL LOW (ref 39.0–52.0)
Hemoglobin: 10.7 g/dL — ABNORMAL LOW (ref 13.0–17.0)
Immature Granulocytes: 0 %
Lymphocytes Relative: 30 %
Lymphs Abs: 1.6 10*3/uL (ref 0.7–4.0)
MCH: 27.5 pg (ref 26.0–34.0)
MCHC: 30.7 g/dL (ref 30.0–36.0)
MCV: 89.7 fL (ref 80.0–100.0)
Monocytes Absolute: 0.3 10*3/uL (ref 0.1–1.0)
Monocytes Relative: 6 %
Neutro Abs: 3.3 10*3/uL (ref 1.7–7.7)
Neutrophils Relative %: 63 %
Platelets: 146 10*3/uL — ABNORMAL LOW (ref 150–400)
RBC: 3.89 MIL/uL — ABNORMAL LOW (ref 4.22–5.81)
RDW: 15.3 % (ref 11.5–15.5)
WBC: 5.2 10*3/uL (ref 4.0–10.5)
nRBC: 0 % (ref 0.0–0.2)

## 2023-02-28 LAB — BASIC METABOLIC PANEL
Anion gap: 19 — ABNORMAL HIGH (ref 5–15)
BUN: 33 mg/dL — ABNORMAL HIGH (ref 8–23)
CO2: 19 mmol/L — ABNORMAL LOW (ref 22–32)
Calcium: 9 mg/dL (ref 8.9–10.3)
Chloride: 103 mmol/L (ref 98–111)
Creatinine, Ser: 1.89 mg/dL — ABNORMAL HIGH (ref 0.61–1.24)
GFR, Estimated: 36 mL/min — ABNORMAL LOW (ref >60)
Glucose, Bld: 179 mg/dL — ABNORMAL HIGH (ref 70–99)
Potassium: 3.5 mmol/L (ref 3.5–5.1)
Sodium: 141 mmol/L (ref 135–145)

## 2023-02-28 LAB — BRAIN NATRIURETIC PEPTIDE: B Natriuretic Peptide: 336.5 pg/mL — ABNORMAL HIGH (ref 0.0–100.0)

## 2023-02-28 LAB — TROPONIN I (HIGH SENSITIVITY)
Troponin I (High Sensitivity): 31 ng/L — ABNORMAL HIGH (ref <18)
Troponin I (High Sensitivity): 32 ng/L — ABNORMAL HIGH (ref <18)

## 2023-02-28 LAB — MAGNESIUM: Magnesium: 1.9 mg/dL (ref 1.7–2.4)

## 2023-02-28 MED ORDER — LACTATED RINGERS IV BOLUS
250.0000 mL | Freq: Once | INTRAVENOUS | Status: AC
  Administered 2023-02-28: 250 mL via INTRAVENOUS

## 2023-02-28 NOTE — ED Provider Notes (Signed)
[facility] AT [facility] Provider Note   CSN: [id number] Arrival date & time: 02/28/23  4098     History  Chief Complaint  Patient presents with   Near Syncope    [name] is a 82 y.o. male.  82 year old male presents today for evaluation of near syncopal episode.  Did not lose consciousness.  He denies chest pain.  His wife recently passed away last week.  [name] is tomorrow.  He endorsed some shortness of breath.  He states all of his symptoms started this morning.  He does have history of CHF.  Reports chronic two-pillow orthopnea.  No recent illness.  No palpitations.  He states he is intermittent compliance with Lasix.  Took this medication last yesterday.  States when he takes this he has to constantly go to the bathroom.  The history is provided by the patient. No language interpreter was used.       Home Medications Prior to Admission medications   Medication Sig Start Date End Date Taking? Authorizing Provider  acetaminophen (TYLENOL) 650 MG CR tablet Take 650 mg by mouth every 8 (eight) hours as needed for pain.    [provider]  albuterol (PROVENTIL) (2.5 MG/3ML) 0.083% nebulizer solution Take 3 mLs (2.5 mg total) by nebulization every 6 (six) hours as needed for wheezing or shortness of breath. 07/11/22   Claiborne Rigg, NP  albuterol (VENTOLIN HFA) 108 (90 Base) MCG/ACT inhaler Inhale 1-2 puffs into the lungs every 6 (six) hours as needed for wheezing or shortness of breath. 11/13/21   Claiborne Rigg, NP  aspirin EC 81 MG tablet Take 1 tablet (81 mg total) by mouth daily. Swallow whole. 12/15/22   Perlie Gold, PA-C  atorvastatin (LIPITOR) 80 MG tablet Take 1 tablet (80 mg total) by mouth once daily. 02/20/22   [place], Peter M, MD  famotidine (PEPCID) 40 MG tablet Take 1 tablet (40 mg total) by mouth daily. 11/05/22   Claiborne Rigg, NP  Glycopyrrolate-Formoterol (BEVESPI AEROSPHERE) 9-4.8 MCG/ACT AERO Inhale 2 puffs  into the lungs 2 (two) times daily. NEEDS PASS 07/12/22   Claiborne Rigg, NP  hydrALAZINE (APRESOLINE) 25 MG tablet Take 1 tablet (25 mg total) by mouth 3 (three) times daily. 02/06/23 02/06/24  Robbie Lis M, PA-C  ipratropium-albuterol (DUONEB) 0.5-2.5 (3) MG/3ML SOLN Take 3 mLs by nebulization every 6 (six) hours as needed (wheezing or shortness of breath). 09/14/22   Zenia Resides, MD  isosorbide mononitrate (IMDUR) 30 MG 24 hr tablet Take 0.5 tablets (15 mg total) by mouth daily. 02/06/23 02/06/24  Robbie Lis M, PA-C  metoprolol (TOPROL-XL) 200 MG 24 hr tablet Take 1 tablet (200 mg total) by mouth daily. Take with or immediately following a meal. 12/15/22 03/15/23  Perlie Gold, PA-C  mometasone-formoterol (DULERA) 200-5 MCG/ACT AERO Inhale 2 puffs into the lungs 2 (two) times daily. 07/11/22   Claiborne Rigg, NP  nitroGLYCERIN (NITROSTAT) 0.4 MG SL tablet Place 1 tablet (0.4 mg total) under the tongue every 5 (five) minutes as needed for chest pain. 12/08/21 04/27/24  Hoy Register, MD  potassium chloride (KLOR-CON M) 10 MEQ tablet Take 1 tablet (10 mEq total) by mouth daily. 02/06/23   Robbie Lis M, PA-C  ranolazine (RANEXA) 500 MG 12 hr tablet Take 1 tablet (500 mg total) by mouth 2 (two) times daily. 12/14/22   Perlie Gold, PA-C  ticagrelor (BRILINTA) 90 MG TABS tablet Take 1 tablet (90 mg total) by  mouth 2 (two) times daily. 12/14/22 06/12/23  Perlie Gold, PA-C  torsemide (DEMADEX) 20 MG tablet Take 1 tablet (20 mg total) by mouth daily. 02/06/23   Allayne Butcher, PA-C      Allergies    Acyclovir and related and Aspirin    Review of Systems   Review of Systems  Constitutional:  Negative for fever.  Respiratory:  Positive for shortness of breath. Negative for cough.   Cardiovascular:  Negative for chest pain, palpitations and leg swelling.  Gastrointestinal:  Negative for abdominal pain.  Neurological:  Positive for light-headedness. Negative for syncope.   All other systems reviewed and are negative.   Physical Exam Updated Vital Signs BP 104/70 (BP Location: Right Arm)   Pulse 60   Temp (!) 97.1 F (36.2 C) (Oral)   Resp 20   Ht 5\' 8"  (1.727 m)   Wt 65.8 kg   SpO2 100%   BMI 22.05 kg/m  Physical Exam Vitals and nursing note reviewed.  Constitutional:      General: He is not in acute distress.    Appearance: Normal appearance. He is not ill-appearing.  HENT:     Head: Normocephalic and atraumatic.     Nose: Nose normal.  Eyes:     General: No scleral icterus.    Extraocular Movements: Extraocular movements intact.     Conjunctiva/sclera: Conjunctivae normal.  Cardiovascular:     Rate and Rhythm: Normal rate.     Heart sounds: Normal heart sounds.  Pulmonary:     Effort: Pulmonary effort is normal. No respiratory distress.     Breath sounds: Normal breath sounds. No wheezing or rales.  Abdominal:     General: There is no distension.     Tenderness: There is no abdominal tenderness.  Musculoskeletal:        General: Normal range of motion.     Cervical back: Normal range of motion.     Right lower leg: No edema.     Left lower leg: No edema.  Skin:    General: Skin is warm and dry.  Neurological:     General: No focal deficit present.     Mental Status: He is alert. Mental status is at baseline.     ED Results / Procedures / Treatments   Labs (all labs ordered are listed, but only abnormal results are displayed) Labs Reviewed  CBC WITH DIFFERENTIAL/PLATELET - Abnormal; Notable for the following components:      Result Value   RBC 3.89 (*)    Hemoglobin 10.7 (*)    HCT 34.9 (*)    Platelets 146 (*)    All other components within normal limits  BASIC METABOLIC PANEL  BRAIN NATRIURETIC PEPTIDE  MAGNESIUM  TROPONIN I (HIGH SENSITIVITY)    EKG EKG Interpretation  Date/Time:  Thursday February 28 2023 09:24:52 EDT Ventricular Rate:  62 PR Interval:  168 QRS Duration: 160 QT Interval:  477 QTC  Calculation: 485 R Axis:   264 Text Interpretation: paced rhythm RBBB and LAFB similar to April 2024 Confirmed by Pricilla Loveless [phone]) on 02/28/2023 9:38:26 AM  Radiology DG Chest Port 1 View  Result Date: 02/28/2023 CLINICAL DATA:  Dyspnea.  Syncope EXAM: PORTABLE CHEST 1 VIEW COMPARISON:  12/14/2022 x-ray and older FINDINGS: Hyperinflation. Enlarged cardiopericardial silhouette with calcified tortuous aorta. Left upper chest defibrillator. No pneumothorax, effusion or consolidation. There is some bandlike opacity lung bases. Atelectasis is favored, left-greater-than-right. No edema. Degenerative changes of the spine. IMPRESSION: Hyperinflation. Linear  opacity at the lung bases. Atelectasis is favored. Defibrillator Electronically Signed   By: Karen Kays M.D.   On: 02/28/2023 10:16    Procedures Procedures    Medications Ordered in ED Medications - No data to display  ED Course/ Medical Decision Making/ A&P                             Medical Decision Making Amount and/or Complexity of Data Reviewed Labs: ordered. Radiology: ordered.   Medical Decision Making / ED Course   This patient presents to the ED for concern of near syncope, this involves an extensive number of treatment options, and is a complaint that carries with it a high risk of complications and morbidity.  The differential diagnosis includes dehydration, CHF exacerbation, arrhythmia  MDM: 82 year old male with past medical history significant for CHF status post defib in place who presents today for concern of near syncopal episode.  Under a lot of stress due to his wife passing recently.  [name] is tomorrow.  Does endorse taking Lasix intermittently most recently yesterday.  States he has significant urine output when he takes this.  Orthostatic positive during exam.  Will obtain labs and obtain chest x-ray to ensure there is no significant concern for volume overload.  CBC without leukocytosis, hemoglobin at  baseline at 10.7.  BMP shows creatinine of 1.89 which is uptrending from recent of 1.3 from 5/29.  Baseline creatinine has between 1.00-1.20.  Recent admission shows peak of 3.0 from contrast-induced nephropathy.  BNP is 336.  This is downtrending from most recent.  Troponin of 32 and flat.  Without chest pain.  EKG without acute ischemic changes.  No suspicion for ACS.  Magnesium 1.9.  He does have an AKI.  Concern for dehydration.  In setting of CHF unable to give generous fluid hydration.  Discussed admission for gentle hydration however he defers due to needing to attend his wife's funeral tomorrow.  Will give an additional 250 micro bolus.  Discussed holding his Lasix dose tomorrow.  Prior to discharge his interrogation report was reviewed.  Showed multiple events from today.  2 events from yesterday that required a shock.  Discussed with cardiology who will review these and discuss with EP.  Will await further recommendations.  Signed out to oncoming provider for follow-up on cardiology recommendations.  Lab Tests: -I ordered, reviewed, and interpreted labs.   The pertinent results include:   Labs Reviewed  BASIC METABOLIC PANEL - Abnormal; Notable for the following components:      Result Value   CO2 19 (*)    Glucose, Bld 179 (*)    BUN 33 (*)    Creatinine, Ser 1.89 (*)    GFR, Estimated 36 (*)    Anion gap 19 (*)    All other components within normal limits  BRAIN NATRIURETIC PEPTIDE - Abnormal; Notable for the following components:   B Natriuretic Peptide 336.5 (*)    All other components within normal limits  CBC WITH DIFFERENTIAL/PLATELET - Abnormal; Notable for the following components:   RBC 3.89 (*)    Hemoglobin 10.7 (*)    HCT 34.9 (*)    Platelets 146 (*)    All other components within normal limits  TROPONIN I (HIGH SENSITIVITY) - Abnormal; Notable for the following components:   Troponin I (High Sensitivity) 31 (*)    All other components within normal limits   TROPONIN I (HIGH SENSITIVITY) - Abnormal;  Notable for the following components:   Troponin I (High Sensitivity) 32 (*)    All other components within normal limits  MAGNESIUM      EKG  EKG Interpretation  Date/Time:  Thursday February 28 2023 09:24:52 EDT Ventricular Rate:  62 PR Interval:  168 QRS Duration: 160 QT Interval:  477 QTC Calculation: 485 R Axis:   264 Text Interpretation: paced rhythm RBBB and LAFB similar to April 2024 Confirmed by Pricilla Loveless [phone]) on 02/28/2023 9:38:26 AM         Imaging Studies ordered: I ordered imaging studies including cxr I independently visualized and interpreted imaging. I agree with the radiologist interpretation   Medicines ordered and prescription drug management: Meds ordered this encounter  Medications   lactated ringers bolus 250 mL   lactated ringers bolus 250 mL    -I have reviewed the patients home medicines and have made adjustments as needed  Reevaluation: After the interventions noted above, I reevaluated the patient and found that they have :improved  Co morbidities that complicate the patient evaluation  Past Medical History:  Diagnosis Date   Acute renal failure (HCC) 05/31/2014   Angina decubitus 06/15/2014   CAD (coronary artery disease)    a. s/p stent RCA DES 9/09; b. 2014 Attempted PCI of OM1 @ [place];  c. 04/2014 Cath: LAD 40-50p, D1 95-99 (chronic), LCX 30-40 inf branch, OM1 CTO, RCA 30-40p, RCA patent stent, EF 35%->Med Rx.   Chronic combined systolic and diastolic CHF (congestive heart failure) (HCC)    a. EF about 40 to 45% per echo in April 2013;  b. 04/2014 Echo: EF 20-25%, sev LVH, sev glob HK, Gr 1 DD, mildly reduced RV fxn, PASP . c. 01/2017: EF improved to 50-55%.    Chronic low back pain    Complete heart block (HCC)    COPD (chronic obstructive pulmonary disease) (HCC) dx 06/2013   PFTs 07/08/13: mod obst with resp to bronchodilator, moderate decrease diffusion, airtrapping    COPD with acute exacerbation (HCC) 01/17/2017   COPD with asthma    07/08/13 PFT: FEV1 1.74L (66% pred, 30% change with BD), mod obst with resp to bronchodilator, moderate decrease diffusion, air-trapping 11/2013 Simple spiro>> clear obstruction, FEV1 1.30 L (47% pred) - trial of symbicort 160 2bid 01/26/15      Depression 09/15/2018   Assessment: Increased sadness and depression since losing job  Plan: Patient denies suicidal and homicidal ideations Patient would not like to start medication therapies at this time Will establish patient with [facility] for primary care   Dyslipidemia    a. on statin   Essential hypertension    HLD (hyperlipidemia)    HTN (hypertension)    a. Reports intolerance to hydralazine; b. no beta blockers 2/2 bradycardia;  c. failed on ACE and ARB.   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 04/2014 Echo: EF 20-25%, sev glob HK.   Mixed Ischemic/Non-Ischemic Cardiomyopathy    probable mixed ischemic and non-ischemic    Noncompliance    OSA (obstructive sleep apnea) 04/18/2018   06/11/16 - home sleep study shows AHI of 2.9 an hour with the lowest SaO2 of 79% with an average of 93%  03/08/2018-Home sleep study-AHI 7/HR, SaO2 low 81%    Paroxysmal atrial fibrillation (HCC)    a. identified on device interrogation 01/2016  Pneumonia 09/02/2013   Pulmonary edema 05/10/2014   Respiratory arrest (HCC) 05/18/2014   Sinus bradycardia 05/31/2014   Ventricular tachycardia (HCC)    treated with ATP,  CL 250-300 msec      Dispostion: Signout to oncoming provider.  Final Clinical Impression(s) / ED Diagnoses Final diagnoses:  AKI (acute kidney injury) (HCC)  Dehydration  Chronic combined systolic and diastolic heart failure [facility])    Rx / DC Orders ED Discharge Orders     None         Marita Kansas, PA-C 02/28/23 1525    Pricilla Loveless, MD 03/02/23 1439

## 2023-02-28 NOTE — Telephone Encounter (Signed)
  Chief Complaint: "new medication cause this." Symptoms: in ED for syncopal episode Frequency: today  Disposition: [] ED /[] Urgent Care (no appt availability in office) / [] Appointment(In office/virtual)/ []  [facility]/ [] Home Care/ [] Refused Recommended Disposition /[] [facility]/ [x]  Follow-up with PCP Additional Notes: see narrative:  Pt called to report: In ED Rm 30 Dr stated  "Fluid pill taking cause of kidneys messing up"  Pt stated he was taken of a few pills like  Amlodipine, Since can't take reg dose aspirin rx 81 mg and "Plavix  and Entrestor  working fine until taken off". "Today sweating and called ambulance passed out at work".  "They Gave  me 3-4  aspirins".  "I'm in no pain at all- breathing difficulty and  kidney issues  Dealing with [facility]  senf refill request for needed amlodipine Plavix  "She changed prescriptions. My ER doctor doesn't want me to take fluid pil." "Fluid pill Lasix last hospital kidney values do not take Lasix  can drink water and soda to make sure kidneys are working ok ."  "My wife passed away and  funeral tomorrow at 1100. Told doctor wants to go home and to treat him after he comes back from her funeral   "I want to stay off fluid pill."- "Went to bathroom several times and lower back and abd pain.  I took Tylenol 650 mg to ease pain." "Woke up fine this morning"  Ate breakfast and went to work as soon  Dizzy sitting in car and used an inhaler  Went inside bldg sweating and passed out- drinking water turned on fan -  Pt wants these 4 meds restarted: Entresto Plavix generic  to [facility] Amlodipine Losartan  Pt said meds were working fine with him - and wants   "I believe she made rx for fluid pill pill for fluid in lungs."  Requesting all the pills that she took off - he wants them back. Needs to know the common  side effects.   When pt say in pain concentrating on that part of body... "BP dropped to  109/60 at work 911 called no headache, Breathing  and heart was beating so fast." -   Asked several times were working for me until she changed them the new ones not working like it should Want to stop torsemide   DrAmjar said to stop taking Lasix  Please forward to provider.  Reason for Disposition . [1] Caller has URGENT medicine question about med that PCP or specialist prescribed AND [2] triager unable to answer question  Answer Assessment - Initial Assessment Questions 1. NAME of MEDICINE: "What medicine(s) are you calling about?"     Pt wants to be put back on Entresto, Plavix, Amlodipine, Losartan and wasn't to stop Torsemide.  2. QUESTION: "What is your question?" (e.g., double dose of medicine, side effect)     Mentioned several times that the new medications caused this - pt in hospital  3. PRESCRIBER: "Who prescribed the medicine?" Reason: if prescribed by specialist, call should be referred to that group.     PCP 4. SYMPTOMS: "Do you have any symptoms?" If Yes, ask: "What symptoms are you having?"  "How bad are the symptoms (e.g., mild, moderate, severe)     Currently being treated in ED for Syncopal episode  Protocols used: Medication Question Call-A-AH

## 2023-02-28 NOTE — ED Triage Notes (Signed)
Pt present to ED from place of employment with near syncopal episode. Pt denies chest pain but c/o shortness of breath. Pt hasn't taken plavix, amlodipine in three days. Pt's wife recently expired and funeral procession is tomorrow.

## 2023-02-28 NOTE — Discharge Instructions (Addendum)
Your workup demonstrated that you are dehydrated.  We did discuss the option of coming into the hospital to receive gentle hydration in the setting of your history of congestive heart failure.  However you have a funeral to attend tomorrow and you are unable to stay.  If you have any worsening symptoms you return.  Drink plenty of fluids.  Hold your Lasix dose today.  Follow-up closely with a cardiologist.  For any concerning symptoms return to the emergency room.  You will need to have your kidney function rechecked within 48 hours.  You can have your primary care provider set this up for you.

## 2023-03-01 NOTE — Telephone Encounter (Signed)
These are cardiology medications he will need to follow up with Cardiology regarding torsemide and entresto. I am very confused with the entire message honestly.

## 2023-03-01 NOTE — Telephone Encounter (Signed)
Addendum:  Pt called while in ED. Pt stated that he had a syncopal episode at work and was brought to ED. Pt stated that he thinks changing the meds he was on caused him to be ill. Pt stated that he wanted to call to ask to have those meds restarted. Pt kept saying it was PCP who changed them. Also stated that the ED attending told him not to take Lasix for his CHF.  Note was written as he was talking.

## 2023-03-06 ENCOUNTER — Ambulatory Visit: Payer: Self-pay | Attending: Nurse Practitioner

## 2023-03-18 ENCOUNTER — Encounter: Payer: Self-pay | Admitting: Nurse Practitioner

## 2023-03-18 ENCOUNTER — Ambulatory Visit: Payer: Self-pay | Attending: Adult Health | Admitting: Nurse Practitioner

## 2023-03-18 VITALS — BP 138/68 | HR 61 | Ht 68.0 in | Wt 149.0 lb

## 2023-03-18 DIAGNOSIS — I10 Essential (primary) hypertension: Secondary | ICD-10-CM

## 2023-03-18 DIAGNOSIS — I5042 Chronic combined systolic (congestive) and diastolic (congestive) heart failure: Secondary | ICD-10-CM

## 2023-03-18 DIAGNOSIS — E785 Hyperlipidemia, unspecified: Secondary | ICD-10-CM

## 2023-03-18 DIAGNOSIS — I4729 Other ventricular tachycardia: Secondary | ICD-10-CM

## 2023-03-18 DIAGNOSIS — I480 Paroxysmal atrial fibrillation: Secondary | ICD-10-CM

## 2023-03-18 DIAGNOSIS — N1831 Chronic kidney disease, stage 3a: Secondary | ICD-10-CM

## 2023-03-18 DIAGNOSIS — I25118 Atherosclerotic heart disease of native coronary artery with other forms of angina pectoris: Secondary | ICD-10-CM

## 2023-03-18 NOTE — Progress Notes (Addendum)
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   Past Medical History    Past Medical History:  Diagnosis Date   Acute renal failure (HCC) 05/31/2014   Angina decubitus 06/15/2014   CAD (coronary artery disease)    a. s/p stent RCA DES 9/09; b. 2014 Attempted PCI of OM1 @ [place];  c. 04/2014 Cath: LAD 40-50p, D1 95-99 (chronic), LCX 30-40 inf branch, OM1 CTO, RCA 30-40p, RCA patent stent, EF 35%->Med Rx.   Chronic combined systolic and diastolic CHF (congestive heart failure) (HCC)    a. EF about 40 to 45% per echo in April 2013;  b. 04/2014 Echo: EF 20-25%, sev LVH, sev glob HK, Gr 1 DD, mildly reduced RV fxn, PASP . c. 01/2017: EF improved to 50-55%.    Chronic low back pain    Complete heart block (HCC)    COPD (chronic obstructive pulmonary disease) (HCC) dx 06/2013   PFTs 07/08/13: mod obst with resp to bronchodilator, moderate decrease diffusion, airtrapping   COPD with acute exacerbation (HCC) 01/17/2017   COPD with asthma    07/08/13 PFT: FEV1 1.74L (66% pred, 30% change with BD), mod obst with resp to bronchodilator, moderate decrease diffusion, air-trapping 11/2013 Simple spiro>> clear obstruction, FEV1 1.30 L (47% pred) - trial of symbicort 160 2bid 01/26/15      Depression 09/15/2018   Assessment: Increased sadness and depression since losing job  Plan: Patient denies suicidal and homicidal ideations Patient would not like to start medication therapies  at this time Will establish patient with [facility] for primary care   Dyslipidemia    a. on statin   Essential hypertension    HLD (hyperlipidemia)    HTN (hypertension)    a. Reports intolerance to hydralazine; b. no beta blockers 2/2 bradycardia;  c. failed on ACE and ARB.   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 04/2014 Echo: EF 20-25%, sev glob HK.   Mixed Ischemic/Non-Ischemic Cardiomyopathy    probable mixed ischemic and non-ischemic    Noncompliance    OSA (obstructive sleep apnea) 04/18/2018   06/11/16 - home sleep study shows AHI of 2.9 an hour with the lowest SaO2 of 79% with an average of 93%  03/08/2018-Home sleep study-AHI 7/HR, SaO2 low 81%    Paroxysmal atrial fibrillation (HCC)    a. identified on device interrogation 01/2016   Pneumonia 09/02/2013   Pulmonary edema 05/10/2014   Respiratory arrest (HCC) 05/18/2014   Sinus bradycardia 05/31/2014   Ventricular tachycardia (HCC)    treated with ATP,  CL 250-300 msec   Past Surgical History:  Procedure Laterality Date   BI-VENTRICULAR IMPLANTABLE CARDIOVERTER DEFIBRILLATOR N/A 08/12/2014   MDT Ovidio Kin XT CRTD implanted by Dr Johney Frame   BIV ICD GENERATOR CHANGEOUT N/A 08/08/2021   Procedure: BIV ICD GENERATOR CHANGEOUT;  Surgeon: Hillis Range,  MD;  Location: [facility] INVASIVE CV LAB;  Service: Cardiovascular;  Laterality: N/A;   CARDIAC CATHETERIZATION     ejection fraction 50%   CORONARY STENT INTERVENTION N/A 12/10/2022   Procedure: CORONARY STENT INTERVENTION;  Surgeon: Swaziland, Peter M, MD;  Location: [facility] INVASIVE CV LAB;  Service: Cardiovascular;  Laterality: N/A;   CORONARY STENT INTERVENTION N/A 12/12/2022   Procedure: CORONARY STENT INTERVENTION;  Surgeon: Swaziland, Peter M, MD;  Location: [facility] INVASIVE CV LAB;  Service: Cardiovascular;  Laterality: N/A;    CORONARY ULTRASOUND/IVUS N/A 12/10/2022   Procedure: Intravascular Ultrasound/IVUS;  Surgeon: Swaziland, Peter M, MD;  Location: [facility] INVASIVE CV LAB;  Service: Cardiovascular;  Laterality: N/A;   CORONARY ULTRASOUND/IVUS N/A 12/12/2022   Procedure: Coronary Ultrasound/IVUS;  Surgeon: Swaziland, Peter M, MD;  Location: [facility] INVASIVE CV LAB;  Service: Cardiovascular;  Laterality: N/A;   LEFT HEART CATH AND CORONARY ANGIOGRAPHY N/A 12/10/2022   Procedure: LEFT HEART CATH AND CORONARY ANGIOGRAPHY;  Surgeon: Swaziland, Peter M, MD;  Location: [facility] INVASIVE CV LAB;  Service: Cardiovascular;  Laterality: N/A;   LEFT HEART CATHETERIZATION WITH CORONARY ANGIOGRAM N/A 05/08/2014   Procedure: LEFT HEART CATHETERIZATION WITH CORONARY ANGIOGRAM;  Surgeon: Micheline Chapman, MD;  Location: [facility] CATH LAB;  Service: Cardiovascular;  Laterality: N/A;    Allergies  Allergies  Allergen Reactions   Acyclovir And Related     GI upset    Aspirin Other (See Comments)    GI upset at high doses only.     Labs/Other Studies Reviewed    The following studies were reviewed today:  Cardiac Studies & Procedures   CARDIAC CATHETERIZATION  CARDIAC CATHETERIZATION 12/12/2022  Narrative   Prox RCA lesion is 80% stenosed.   Prox RCA to Mid RCA lesion is 75% stenosed.   Ramus lesion is 40% stenosed.   1st Mrg lesion is 100% stenosed.   RPAV lesion is 100% stenosed.   Non-stenotic Prox LAD to Mid LAD lesion was previously treated.   A drug-eluting stent was successfully placed using a SYNERGY XD 3.50X38.   A drug-eluting stent was successfully placed using a STENT ONYX FRONTIER 3.5X38.   Post intervention, there is a 0% residual stenosis.   Post intervention, there is a 0% residual stenosis.  Successful stenting of the proximal and mid RCA with overlapping 3.5 x 38 mm DES but with resultant no reflow due to poor distal vessel runoff  Plan: Brilinta monotherapy indefinitely due to ASA intolerance. Will monitor in ICU. Check troponins.  IV Ntg and analgesics for pain.  Findings Coronary Findings Diagnostic  Dominance: Right  Left Anterior Descending Non-stenotic Prox LAD to Mid LAD lesion was previously treated. The lesion is segmental. RFR: 0.84.  First Diagonal Branch Vessel is small in size.  Ramus Intermedius Ramus lesion is 40% stenosed.  Left Circumflex  First Obtuse Marginal Branch Collaterals 1st Mrg filled by collaterals from Ramus.  1st Mrg lesion is 100% stenosed. The lesion is chronically occluded.  Second Obtuse Marginal Branch  Right Coronary Artery Prox RCA lesion is 80% stenosed. Prox RCA to Mid RCA lesion is 75% stenosed. The lesion was previously treated using a drug eluting stent over 2 years ago.  Right Posterior Atrioventricular Artery Collaterals RPAV filled by collaterals from Dist Cx.  RPAV lesion is 100% stenosed.  Intervention  Prox RCA lesion Stent CATHETER LAUNCHER 6FR AL1 guide catheter was inserted. Lesion crossed with guidewire using a WIRE ASAHI PROWATER 180CM. Pre-stent angioplasty was performed using a BALLN EMERGE MR 3.0X20. A  drug-eluting stent was successfully placed using a STENT ONYX FRONTIER 3.5X38. Maximum pressure: 14 atm. Stent strut is well apposed. Stent overlaps previously placed stent. Post-stent angioplasty was not performed. Post-Intervention Lesion Assessment The intervention was successful. Pre-interventional TIMI flow is 1. Post-intervention TIMI flow is 3. No-reflow occurred during the intervention. Ultrasound (IVUS) was performed on the lesion post PCI. Stent well apposed. There is a 0% residual stenosis post intervention.  Prox RCA to Mid RCA lesion Stent CATHETER LAUNCHER 6FR AL1 guide catheter was inserted. Lesion crossed with guidewire using a WIRE ASAHI PROWATER 180CM. Pre-stent angioplasty was performed using a BALLN EMERGE MR 2.5X15. A drug-eluting stent was successfully placed using a SYNERGY XD 3.50X38. Maximum pressure: 14 atm. Stent strut  is well apposed. Post-stent angioplasty was not performed. Post-Intervention Lesion Assessment The intervention was successful. Pre-interventional TIMI flow is 3. Post-intervention TIMI flow is 1. No-reflow occurred during the intervention. Ultrasound (IVUS) was performed on the lesion post PCI. Stent well apposed. There is a 0% residual stenosis post intervention.   CARDIAC CATHETERIZATION 12/10/2022  Narrative   Prox LAD to Mid LAD lesion is 70% stenosed.   Ramus lesion is 40% stenosed.   1st Mrg lesion is 100% stenosed.   Prox RCA lesion is 80% stenosed.   Prox RCA to Mid RCA lesion is 75% stenosed.   RPAV lesion is 100% stenosed.   A drug-eluting stent was successfully placed using a SYNERGY XD 3.50X38.   Post intervention, there is a 0% residual stenosis.   There is mild to moderate left ventricular systolic dysfunction.   LV end diastolic pressure is mildly elevated.   The left ventricular ejection fraction is 45-50% by visual estimate.  3 vessel obstructive CAD - 75% mid LAD segemental stenosis with RFR 0.84 - 100% CTO of the first OM with left to left collaterals. - 80% proximal RCA, 75% mid RCA in stent, 100% PLOM with left to right collaterals. 2. Mild to moderate LV dysfunction. EF estimated at 45%. 3. Mildly elevated LVEDP 4. Successful PCI of the mid LAD with IVUS guidance and DES (3.5 x 38 Synergy post dilated to 4.0)   Plan: DAPT indefinitely. Will keep overnight. Repeat Echo. Recheck BMET in am. If renal function is stable plan staged PCI of the proximal and mid RCA tomorrow. Would treat PL and OM occlusions medically.  Findings Coronary Findings Diagnostic  Dominance: Right  Left Anterior Descending Prox LAD to Mid LAD lesion is 70% stenosed. The lesion is segmental. Pressure gradient was performed on the lesion. RFR: 0.84. Ultrasound (IVUS) was performed. Severe plaque burden was detected. IVUS has determined that the lesion is heterogeneous.  First Diagonal  Branch Vessel is small in size.  Ramus Intermedius Ramus lesion is 40% stenosed.  Left Circumflex  First Obtuse Marginal Branch Collaterals 1st Mrg filled by collaterals from Ramus.  1st Mrg lesion is 100% stenosed. The lesion is chronically occluded.  Second Obtuse Marginal Branch  Right Coronary Artery Prox RCA lesion is 80% stenosed. Prox RCA to Mid RCA lesion is 75% stenosed. The lesion was previously treated using a drug eluting stent over 2 years ago.  Right Posterior Atrioventricular Artery Collaterals RPAV filled by collaterals from Dist Cx.  RPAV lesion is 100% stenosed.  Intervention  Prox LAD to Mid LAD lesion Stent CATH [place]IST[place] GUIDE 6FR XBLAD3.5 guide catheter was inserted. Lesion crossed with guidewire using a WIRE ASAHI PROWATER 180CM. Pre-stent angioplasty was performed using a BALLN EMERGE MR 2.5X15. A drug-eluting stent was successfully  placed using a SYNERGY XD 3.50X38. Stent strut is well apposed. Post-stent angioplasty was performed using a BALLN [place] EMERGE MR 4.0X20. Maximum pressure:  16 atm. Post-Intervention Lesion Assessment The intervention was successful. Pre-interventional TIMI flow is 3. Post-intervention TIMI flow is 3. No complications occurred at this lesion. Ultrasound (IVUS) was performed on the lesion post PCI. Stent well apposed. There is a 0% residual stenosis post intervention.   STRESS TESTS  MYOCARDIAL PERFUSION IMAGING 12/15/2021  Narrative   Findings are consistent with prior myocardial infarction. The study is intermediate risk.   No ST deviation was noted.   Left ventricular function is abnormal. Global function is severely reduced. End diastolic cavity size is normal.   Prior study available for comparison from 02/20/2016.  Small, moderate intensity fixed lesion in the inferolateral wall consistent with old scar. No significant change from prior study   ECHOCARDIOGRAM  ECHOCARDIOGRAM LIMITED 12/15/2022  Narrative ECHOCARDIOGRAM  LIMITED REPORT    Patient Name:   CHIAM FLOREZ [facility] Date of Exam: 12/15/2022 Medical Rec #:  [id number]      Height:       68.0 in Accession #:    [id number]     Weight:       143.3 lb Date of Birth:  May 30, 1941     BSA:          1.774 m Patient Age:    81 years       BP:           107/59 mmHg Patient Gender: M              HR:           62 bpm. Exam Location:  Inpatient  Procedure: Limited Echo, Limited Color Doppler and Cardiac Doppler  STAT ECHO  Indications:    chest pain  History:        Patient has prior history of Echocardiogram examinations, most recent 12/14/2022. Cardiomyopathy, CAD, Defibrillator, COPD, Arrythmias:Atrial Fibrillation and LBBB; Risk Factors:Sleep Apnea, Hypertension, Dyslipidemia and Former Smoker.  Sonographer:    Delcie Roch RDCS Referring Phys: 19 RHONDA G BARRETT  IMPRESSIONS   1. Limited study to assess LV function; hypokinesis of the inferior, inferolateral wall with moderate LV dysfunction. 2. Left ventricular ejection fraction, by estimation, is 35 to 40%. The left ventricle has moderately decreased function. The left ventricle demonstrates regional wall motion abnormalities (see scoring diagram/findings for description). There is moderate left ventricular hypertrophy. 3. Right ventricular systolic function is severely reduced. The right ventricular size is normal. There is normal pulmonary artery systolic pressure. 4. The mitral valve is normal in structure. Mild mitral valve regurgitation. No evidence of mitral stenosis. 5. The aortic valve is tricuspid. Aortic valve regurgitation is trivial. Aortic valve sclerosis is present, with no evidence of aortic valve stenosis. 6. The inferior vena cava is normal in size with greater than 50% respiratory variability, suggesting right atrial pressure of 3 mmHg.  FINDINGS Left Ventricle: Left ventricular ejection fraction, by estimation, is 35 to 40%. The left ventricle has moderately decreased  function. The left ventricle demonstrates regional wall motion abnormalities. The left ventricular internal cavity size was normal in size. There is moderate left ventricular hypertrophy.  Right Ventricle: The right ventricular size is normal. Right ventricular systolic function is severely reduced. There is normal pulmonary artery systolic pressure. The tricuspid regurgitant velocity is 1.86 m/s, and with an assumed right atrial pressure of 3 mmHg, the estimated right ventricular systolic pressure is 16.8 mmHg.  Left Atrium: Left atrial size was normal in size.  Right Atrium: Right atrial size was normal in size.  Pericardium: There is no evidence of pericardial effusion.  Mitral Valve: The mitral valve is normal in structure. Mild mitral valve regurgitation. No evidence of mitral valve stenosis.  Tricuspid Valve: The tricuspid valve is normal in structure. Tricuspid valve regurgitation is mild . No evidence of tricuspid stenosis.  Aortic Valve: The aortic valve is tricuspid. Aortic valve regurgitation is trivial. Aortic valve sclerosis is present, with no evidence of aortic valve stenosis.  Pulmonic Valve: The pulmonic valve was normal in structure. Pulmonic valve regurgitation is not visualized.  Aorta: The aortic root is normal in size and structure.  Venous: The inferior vena cava is normal in size with greater than 50% respiratory variability, suggesting right atrial pressure of 3 mmHg.  Additional Comments: Limited study to assess LV function; hypokinesis of the inferior, inferolateral wall with moderate LV dysfunction. A device lead is visualized. Spectral Doppler performed. Color Doppler performed.  LEFT VENTRICLE PLAX 2D LVIDd:         4.40 cm LVIDs:         3.10 cm LV PW:         1.40 cm LV IVS:        1.50 cm  LV Volumes (MOD) LV vol d, MOD A2C: 98.2 ml LV vol d, MOD A4C: 85.8 ml LV vol s, MOD A2C: 63.1 ml LV vol s, MOD A4C: 56.6 ml LV SV MOD A2C:     35.1 ml LV SV  MOD A4C:     85.8 ml LV SV MOD BP:      32.6 ml  IVC IVC diam: 1.90 cm  LEFT ATRIUM         Index LA diam:    3.20 cm 1.80 cm/m AORTIC VALVE LVOT Vmax:   89.30 cm/s LVOT Vmean:  52.800 cm/s LVOT VTI:    0.167 m  MITRAL VALVE               TRICUSPID VALVE MV Area (PHT): 3.53 cm    TR Peak grad:   13.8 mmHg MV Decel Time: 215 msec    TR Vmax:        186.00 cm/s MR Peak grad: 98.0 mmHg MR Mean grad: 59.0 mmHg    SHUNTS MR Vmax:      495.00 cm/s  Systemic VTI: 0.17 m MR Vmean:     351.0 cm/s MV E velocity: 65.60 cm/s MV A velocity: 87.30 cm/s MV E/A ratio:  0.75  Olga Millers MD Electronically signed by Olga Millers MD Signature Date/Time: 12/15/2022/10:18:38 AM    Final            Recent Labs: 12/04/2022: TSH 9.770 02/06/2023: ALT 10 02/28/2023: B Natriuretic Peptide 336.5; BUN 33; Creatinine, Ser 1.89; Hemoglobin 10.7; Magnesium 1.9; Platelets 146; Potassium 3.5; Sodium 141  Recent Lipid Panel    Component Value Date/Time   CHOL 166 12/12/2022 0249   CHOL 219 (H) 02/16/2022 0853   TRIG 120 12/12/2022 0249   HDL 48 12/12/2022 0249   HDL 65 02/16/2022 0853   CHOLHDL 3.5 12/12/2022 0249   VLDL 24 12/12/2022 0249   LDLCALC 94 12/12/2022 0249   LDLCALC 135 (H) 02/16/2022 0853   LDLDIRECT 132.9 06/18/2013 1038    History of Present Illness    82 year old male with the above past medical history including CAD s/p prior stenting-RCA in 2009, attempted PCI-OM in 2014, DES-LAD and RCA  x 2 overlapping in 12/2022, 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  He has a history of prior stenting to the RCA in 2009, also with attempted PCI-OM in 2014.  He was hospitalized in August 2015 in setting of acute pulmonary edema.  Echocardiogram the time showed EF decreased to 2025%.  Repeat echo in November 2015 showed EF 30%.  He underwent  ICD/BiV implant by Dr. Johney Frame, s/p generator change in 2022.  Most recent cardiac catheterization in 12/2022 demonstrated 75% mid LAD, CTO of OM1 with with left to left collaterals, 80% P RCA, 75% mRCA (ISR), and 100% PLOM stenosis with left to left collaterals.  He underwent successful PCI/DES of mid LAD.  He later underwent staged PCI/DES x 2 overlapping in the mid to distal RCA.  Postintervention he had no reflow with distal embolization, elevated troponin.  Medical management was recommended.  Echocardiogram at the time showed EF 30 to 35%.  Limited echo on the day of discharge showed EF improved to 50 to 55%.  Repeat limited echo in 12/2022 showed EF 35 to 40%, hypokinesis in the inferior inferolateral wall, severely reduced RV systolic function.  He was referred to the heart failure TOC clinic.  He was last seen in the heart failure TOC clinic on 02/06/2023 and was stable overall from a cardiac standpoint.  Device interrogation showed signs of volume overload since early April.  Escalation of GDMT was limited in the setting of AKI on CKD.  He was started back on torsemide 20 mg daily.  He was started on Imdur and hydralazine.  He presented to the ED on 02/28/2023 in the setting of near syncope.  He reported a significant amount of personal stress with his wife had died the week before.  He noted intermittent parents to diuretics.  He also noted some mild shortness of breath, chronic two-pillow orthopnea.  He denied palpitations.  Orthostatics were positive.  Troponin was mildly elevated with flat trend.  EKG was without ischemic changes.  Per ED physician report, device interrogation showed "multiple events," including 2 events from the day before that required a shock. There was concern for dehydration/AKI.  He deferred admission as his wife's funeral was scheduled for the following day.  He was gently hydrated and discharged home in stable condition.  He presents today for follow-up.  Since his ED visit he has  been stable from a cardiac standpoint.  He denies any further dizziness, presyncope, or syncope.  He denies any chest pain, dyspnea, edema, PND, orthopnea, weight gain.  He has only been taking his torsemide as needed.  Overall, his symptoms have improved.  He continues to mourn the loss of his wife but states he is adjusting.  Home Medications    Current Outpatient Medications  Medication Sig Dispense Refill   acetaminophen (TYLENOL) 650 MG CR tablet Take 650 mg by mouth every 8 (eight) hours as needed for pain.     albuterol (PROVENTIL) (2.5 MG/3ML) 0.083% nebulizer solution Take 3 mLs (2.5 mg total) by nebulization every 6 (six) hours as needed for wheezing or shortness of breath. 180 mL 1   albuterol (VENTOLIN HFA) 108 (90 Base) MCG/ACT inhaler Inhale 1-2 puffs into the lungs every 6 (six) hours as needed for wheezing or shortness of breath. 18 g 1   aspirin EC 81 MG tablet Take 1 tablet (81 mg total) by mouth daily. Swallow whole.  30 tablet 11   atorvastatin (LIPITOR) 80 MG tablet Take 1 tablet (80 mg total) by mouth once daily. 90 tablet 3   famotidine (PEPCID) 40 MG tablet Take 1 tablet (40 mg total) by mouth daily. 90 tablet 1   Glycopyrrolate-Formoterol (BEVESPI AEROSPHERE) 9-4.8 MCG/ACT AERO Inhale 2 puffs into the lungs 2 (two) times daily. NEEDS PASS 10.7 g 11   hydrALAZINE (APRESOLINE) 25 MG tablet Take 1 tablet (25 mg total) by mouth 3 (three) times daily. 90 tablet 1   ipratropium-albuterol (DUONEB) 0.5-2.5 (3) MG/3ML SOLN Take 3 mLs by nebulization every 6 (six) hours as needed (wheezing or shortness of breath). 360 mL 0   isosorbide mononitrate (IMDUR) 30 MG 24 hr tablet Take 0.5 tablets (15 mg total) by mouth daily. 15 tablet 1   metoprolol (TOPROL-XL) 200 MG 24 hr tablet Take 1 tablet (200 mg total) by mouth daily. Take with or immediately following a meal. 30 tablet 2   mometasone-formoterol (DULERA) 200-5 MCG/ACT AERO Inhale 2 puffs into the lungs 2 (two) times daily. 13 g 6    nitroGLYCERIN (NITROSTAT) 0.4 MG SL tablet Place 1 tablet (0.4 mg total) under the tongue every 5 (five) minutes as needed for chest pain. 25 tablet 3   potassium chloride (KLOR-CON M) 10 MEQ tablet Take 1 tablet (10 mEq total) by mouth daily. 30 tablet 1   ranolazine (RANEXA) 500 MG 12 hr tablet Take 1 tablet (500 mg total) by mouth 2 (two) times daily. 60 tablet 2   ticagrelor (BRILINTA) 90 MG TABS tablet Take 1 tablet (90 mg total) by mouth 2 (two) times daily. 60 tablet 5   torsemide (DEMADEX) 20 MG tablet Take 1 tablet (20 mg total) by mouth daily. (Patient taking differently: Take 20 mg by mouth daily as needed. Take daily as needed for weight gain of 3 lbs overnight or 5 lb in 1 week.) 30 tablet 1   No current facility-administered medications for this visit.     Review of Systems    He denies chest pain, palpitations, dyspnea, pnd, orthopnea, n, v, dizziness, syncope, edema, weight gain, or early satiety. All other systems reviewed and are otherwise negative except as noted above.   Physical Exam    VS:  BP 138/68   Pulse 61   Ht 5\' 8"  (1.727 m)   Wt 149 lb (67.6 kg)   SpO2 96%   BMI 22.66 kg/m   GEN: Well nourished, well developed, in no acute distress. HEENT: normal. Neck: Supple, no JVD, carotid bruits, or masses. Cardiac: RRR, no murmurs, rubs, or gallops. No clubbing, cyanosis, edema.  Radials/DP/PT 2+ and equal bilaterally.  Respiratory:  Respirations regular and unlabored, clear to auscultation bilaterally. GI: Soft, nontender, nondistended, BS + x 4. MS: no deformity or atrophy. Skin: warm and dry, no rash. Neuro:  Strength and sensation are intact. Psych: Normal affect.  Accessory Clinical Findings    ECG personally reviewed by me today - EKG Interpretation Date/Time:  Monday March 18 2023 14:10:18 EDT Ventricular Rate:  61 PR Interval:  164 QRS Duration:  156 QT Interval:  472 QTC Calculation: 475 R Axis:   268  Text Interpretation: AV dual-paced rhythm  Biventricular pacemaker detected When compared with ECG of 28-Feb-2023 09:24, PREVIOUS ECG IS PRESENT Confirmed by Bernadene Person ([id number]) on 03/18/2023 2:11:46 PM  - no acute changes.   Lab Results  Component Value Date   WBC 5.2 02/28/2023   HGB 10.7 (L) 02/28/2023   HCT 34.9 (  L) 02/28/2023   MCV 89.7 02/28/2023   PLT 146 (L) 02/28/2023   Lab Results  Component Value Date   CREATININE 1.89 (H) 02/28/2023   BUN 33 (H) 02/28/2023   NA 141 02/28/2023   K 3.5 02/28/2023   CL 103 02/28/2023   CO2 19 (L) 02/28/2023   Lab Results  Component Value Date   ALT 10 02/06/2023   AST 17 02/06/2023   ALKPHOS 96 02/06/2023   BILITOT 0.5 02/06/2023   Lab Results  Component Value Date   CHOL 166 12/12/2022   HDL 48 12/12/2022   LDLCALC 94 12/12/2022   LDLDIRECT 132.9 06/18/2013   TRIG 120 12/12/2022   CHOLHDL 3.5 12/12/2022    Lab Results  Component Value Date   HGBA1C 6.1 (H) 12/12/2022    Assessment & Plan    1. CAD: S/p prior stenting-RCA in 2009, attempted PCI-OM in 2014, DES-LAD and RCA x 2 overlapping in 12/2022. Stable with no anginal symptoms. No indication for ischemic evaluation.  Continue aspirin, Brilinta, metoprolol, hydralazine, Imdur, Ranexa, and torsemide.  2. 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  Has been seen twice in the heart failure TOC clinic.  Amlodipine and Entresto were discontinued in the setting of CKD/AKI.  Further escalation of GDMT has been limited in the setting of AKI/CKD.  Device interrogation has shown evidence of volume overload. Euvolemic and well compensated on exam.  Will repeat BMET today.  He is only taking torsemide as needed okay to continue same.  If renal function stable, could consider reintroduction of low-dose Entresto with close monitoring of BP/renal function, will defer  for now.  Continue current medications as above.  3. Paroxysmal atrial fibrillation: Low burden on device checks.  Denies any recent palpitations.  Not on anticoagulation per EP.  Continue metoprolol.  4. 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  Amiodarone was discontinued following revascularization. Recent ED evaluation noted "multiple events" on device check, including 2 events from the day prior to ED evaluation that required a shock.  I did not see evidence of this on my review of device interrogation report.  Patient states he was told this was an error.  He denies any significant palpitations, denies any awareness of shocks, any recurrent presyncope or syncope.  This likely occurred in the setting of dehydration/hypotension. I will send a message to the device clinic to review of most recent interrogation.  Continue metoprolol.  Addendum 03/28/2023: Device interrogation reviewed by Barrington Ellison, RN who states "I reviewed the 02/28/23 device transmission that was performed in the ER.   There were no events occurring at all in June. He does have a short 1 second NSVT event on 5/29 and 5/17 and there were several short NSVT'S in March but otherwise no noted VT events.   VF events tx successfully w/ ATP occurred in February 2024, Nov and Dec 2023.  (These were known and evaluated at that time).    I do not see where patient ever received any shock therapy."  5. Hypertension: BP well controlled. Continue current antihypertensive regimen.  Continue Lipitor.  6. Hyperlipidemia: LDL was 94 in 12/2022, above goal. Could consider referral to lipid clinic Pharm.D. at follow-up.  Continue Lipitor.    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  Will repeat BMET today.  8. Disposition: Follow-up in 1 month with APP, follow-up as scheduled with Dr. Elberta Fortis in 05/2023, follow-up in 6 months with Dr. Swaziland.      Joylene Grapes,  NP 03/18/2023, 2:52 PM

## 2023-03-18 NOTE — Patient Instructions (Addendum)
Medication Instructions:  Your physician recommends that you continue on your current medications as directed. Please refer to the Current Medication list given to you today.  *If you need a refill on your cardiac medications before your next appointment, please call your pharmacy*   Lab Work: BMET today   Testing/Procedures: NONE ordered at this time of appointment     Follow-Up: At [facility], you and your health needs are our priority.  As part of our continuing mission to provide you with exceptional heart care, we have created designated Provider Care Teams.  These Care Teams include your primary Cardiologist (physician) and Advanced Practice Providers (APPs -  Physician Assistants and Nurse Practitioners) who all work together to provide you with the care you need, when you need it.  We recommend signing up for the patient portal called "MyChart".  Sign up information is provided on this After Visit Summary.  MyChart is used to connect with patients for Virtual Visits (Telemedicine).  Patients are able to view lab/test results, encounter notes, upcoming appointments, etc.  Non-urgent messages can be sent to your provider as well.   To learn more about what you can do with MyChart, go to [email or web address].    Your next appointment:   1 month(s) Joni Reining, DNP, or Bernadene Person NP) 6 months (Dr. Swaziland recall placed) Provider:   Joni Reining, DNP, ANP or Bernadene Person, NP

## 2023-03-19 LAB — BASIC METABOLIC PANEL
BUN/Creatinine Ratio: 13 (ref 10–24)
BUN: 17 mg/dL (ref 8–27)
CO2: 27 mmol/L (ref 20–29)
Calcium: 9.1 mg/dL (ref 8.6–10.2)
Chloride: 107 mmol/L — ABNORMAL HIGH (ref 96–106)
Creatinine, Ser: 1.3 mg/dL — ABNORMAL HIGH (ref 0.76–1.27)
Glucose: 85 mg/dL (ref 70–99)
Potassium: 4.2 mmol/L (ref 3.5–5.2)
Sodium: 147 mmol/L — ABNORMAL HIGH (ref 134–144)
eGFR: 56 mL/min/{1.73_m2} — ABNORMAL LOW (ref >59)

## 2023-03-19 LAB — SPECIMEN STATUS REPORT

## 2023-03-25 ENCOUNTER — Telehealth: Payer: Self-pay

## 2023-03-25 NOTE — Telephone Encounter (Signed)
Spoke with pt. Pt was notified of lab results. Pt will continue current medication and f/u as planned.  

## 2023-04-02 ENCOUNTER — Encounter (HOSPITAL_COMMUNITY): Payer: Self-pay

## 2023-04-02 ENCOUNTER — Inpatient Hospital Stay (HOSPITAL_COMMUNITY)
Admission: EM | Admit: 2023-04-02 | Discharge: 2023-04-04 | DRG: 291 | Disposition: A | Discharge status: Home / Self Care | Payer: Medicare Other | Attending: Internal Medicine | Admitting: Internal Medicine

## 2023-04-02 ENCOUNTER — Other Ambulatory Visit: Payer: Self-pay

## 2023-04-02 ENCOUNTER — Emergency Department (HOSPITAL_COMMUNITY): Payer: Medicare Other

## 2023-04-02 DIAGNOSIS — I5040 Unspecified combined systolic (congestive) and diastolic (congestive) heart failure: Secondary | ICD-10-CM

## 2023-04-02 DIAGNOSIS — E872 Acidosis, unspecified: Secondary | ICD-10-CM | POA: Diagnosis present

## 2023-04-02 DIAGNOSIS — E785 Hyperlipidemia, unspecified: Secondary | ICD-10-CM | POA: Diagnosis present

## 2023-04-02 DIAGNOSIS — Z955 Presence of coronary angioplasty implant and graft: Secondary | ICD-10-CM

## 2023-04-02 DIAGNOSIS — I130 Hypertensive heart and chronic kidney disease with heart failure and stage 1 through stage 4 chronic kidney disease, or unspecified chronic kidney disease: Principal | ICD-10-CM | POA: Diagnosis present

## 2023-04-02 DIAGNOSIS — J984 Other disorders of lung: Secondary | ICD-10-CM | POA: Diagnosis present

## 2023-04-02 DIAGNOSIS — Z7902 Long term (current) use of antithrombotics/antiplatelets: Secondary | ICD-10-CM

## 2023-04-02 DIAGNOSIS — J4489 Other specified chronic obstructive pulmonary disease: Secondary | ICD-10-CM

## 2023-04-02 DIAGNOSIS — I428 Other cardiomyopathies: Secondary | ICD-10-CM | POA: Diagnosis present

## 2023-04-02 DIAGNOSIS — G4733 Obstructive sleep apnea (adult) (pediatric): Secondary | ICD-10-CM | POA: Diagnosis present

## 2023-04-02 DIAGNOSIS — I509 Heart failure, unspecified: Secondary | ICD-10-CM

## 2023-04-02 DIAGNOSIS — J40 Bronchitis, not specified as acute or chronic: Secondary | ICD-10-CM

## 2023-04-02 DIAGNOSIS — F32A Depression, unspecified: Secondary | ICD-10-CM | POA: Diagnosis present

## 2023-04-02 DIAGNOSIS — J441 Chronic obstructive pulmonary disease with (acute) exacerbation: Secondary | ICD-10-CM | POA: Diagnosis present

## 2023-04-02 DIAGNOSIS — J9601 Acute respiratory failure with hypoxia: Secondary | ICD-10-CM | POA: Diagnosis present

## 2023-04-02 DIAGNOSIS — Z1152 Encounter for screening for COVID-19: Secondary | ICD-10-CM

## 2023-04-02 DIAGNOSIS — Z7951 Long term (current) use of inhaled steroids: Secondary | ICD-10-CM

## 2023-04-02 DIAGNOSIS — I255 Ischemic cardiomyopathy: Secondary | ICD-10-CM | POA: Diagnosis present

## 2023-04-02 DIAGNOSIS — I2510 Atherosclerotic heart disease of native coronary artery without angina pectoris: Secondary | ICD-10-CM | POA: Diagnosis present

## 2023-04-02 DIAGNOSIS — Z5986 Financial insecurity: Secondary | ICD-10-CM

## 2023-04-02 DIAGNOSIS — R0603 Acute respiratory distress: Secondary | ICD-10-CM | POA: Diagnosis present

## 2023-04-02 DIAGNOSIS — Z7982 Long term (current) use of aspirin: Secondary | ICD-10-CM

## 2023-04-02 DIAGNOSIS — Z87891 Personal history of nicotine dependence: Secondary | ICD-10-CM

## 2023-04-02 DIAGNOSIS — Z23 Encounter for immunization: Secondary | ICD-10-CM

## 2023-04-02 DIAGNOSIS — Z79899 Other long term (current) drug therapy: Secondary | ICD-10-CM

## 2023-04-02 DIAGNOSIS — Z886 Allergy status to analgesic agent status: Secondary | ICD-10-CM

## 2023-04-02 DIAGNOSIS — F40240 Claustrophobia: Secondary | ICD-10-CM | POA: Diagnosis present

## 2023-04-02 DIAGNOSIS — N1831 Chronic kidney disease, stage 3a: Secondary | ICD-10-CM | POA: Diagnosis present

## 2023-04-02 DIAGNOSIS — I447 Left bundle-branch block, unspecified: Secondary | ICD-10-CM | POA: Diagnosis present

## 2023-04-02 DIAGNOSIS — I480 Paroxysmal atrial fibrillation: Secondary | ICD-10-CM | POA: Diagnosis present

## 2023-04-02 DIAGNOSIS — J9602 Acute respiratory failure with hypercapnia: Secondary | ICD-10-CM | POA: Diagnosis present

## 2023-04-02 DIAGNOSIS — I5043 Acute on chronic combined systolic (congestive) and diastolic (congestive) heart failure: Secondary | ICD-10-CM | POA: Diagnosis present

## 2023-04-02 DIAGNOSIS — I161 Hypertensive emergency: Secondary | ICD-10-CM | POA: Diagnosis present

## 2023-04-02 LAB — RESP PANEL BY RT-PCR (RSV, FLU A&B, COVID)  RVPGX2
Influenza A by PCR: NEGATIVE
Influenza B by PCR: NEGATIVE
Resp Syncytial Virus by PCR: NEGATIVE
SARS Coronavirus 2 by RT PCR: NEGATIVE

## 2023-04-02 LAB — I-STAT ARTERIAL BLOOD GAS, ED
Acid-base deficit: 1 mmol/L (ref 0.0–2.0)
Bicarbonate: 24 mmol/L (ref 20.0–28.0)
Calcium, Ion: 1.16 mmol/L (ref 1.15–1.40)
HCT: 29 % — ABNORMAL LOW (ref 39.0–52.0)
Hemoglobin: 9.9 g/dL — ABNORMAL LOW (ref 13.0–17.0)
O2 Saturation: 98 %
Patient temperature: 98.4
Potassium: 3.7 mmol/L (ref 3.5–5.1)
Sodium: 142 mmol/L (ref 135–145)
TCO2: 25 mmol/L (ref 22–32)
pCO2 arterial: 41 mmHg (ref 32–48)
pH, Arterial: 7.375 (ref 7.35–7.45)
pO2, Arterial: 103 mmHg (ref 83–108)

## 2023-04-02 LAB — BASIC METABOLIC PANEL
Anion gap: 10 (ref 5–15)
BUN: 15 mg/dL (ref 8–23)
CO2: 22 mmol/L (ref 22–32)
Calcium: 8.5 mg/dL — ABNORMAL LOW (ref 8.9–10.3)
Chloride: 109 mmol/L (ref 98–111)
Creatinine, Ser: 1.36 mg/dL — ABNORMAL HIGH (ref 0.61–1.24)
GFR, Estimated: 53 mL/min — ABNORMAL LOW (ref >60)
Glucose, Bld: 90 mg/dL (ref 70–99)
Potassium: 3.8 mmol/L (ref 3.5–5.1)
Sodium: 141 mmol/L (ref 135–145)

## 2023-04-02 LAB — CBC
HCT: 31.7 % — ABNORMAL LOW (ref 39.0–52.0)
Hemoglobin: 9.8 g/dL — ABNORMAL LOW (ref 13.0–17.0)
MCH: 28.1 pg (ref 26.0–34.0)
MCHC: 30.9 g/dL (ref 30.0–36.0)
MCV: 90.8 fL (ref 80.0–100.0)
Platelets: 140 10*3/uL — ABNORMAL LOW (ref 150–400)
RBC: 3.49 MIL/uL — ABNORMAL LOW (ref 4.22–5.81)
RDW: 16.4 % — ABNORMAL HIGH (ref 11.5–15.5)
WBC: 6 10*3/uL (ref 4.0–10.5)
nRBC: 0 % (ref 0.0–0.2)

## 2023-04-02 LAB — TSH: TSH: 6.682 u[IU]/mL — ABNORMAL HIGH (ref 0.350–4.500)

## 2023-04-02 LAB — T4, FREE: Free T4: 1.27 ng/dL — ABNORMAL HIGH (ref 0.61–1.12)

## 2023-04-02 LAB — BRAIN NATRIURETIC PEPTIDE: B Natriuretic Peptide: 1708.7 pg/mL — ABNORMAL HIGH (ref 0.0–100.0)

## 2023-04-02 LAB — TROPONIN I (HIGH SENSITIVITY)
Troponin I (High Sensitivity): 49 ng/L — ABNORMAL HIGH (ref <18)
Troponin I (High Sensitivity): 50 ng/L — ABNORMAL HIGH (ref <18)

## 2023-04-02 MED ORDER — BUDESONIDE 0.5 MG/2ML IN SUSP
0.5000 mg | Freq: Two times a day (BID) | RESPIRATORY_TRACT | Status: DC
  Administered 2023-04-02 – 2023-04-04 (×5): 0.5 mg via RESPIRATORY_TRACT
  Filled 2023-04-02 (×5): qty 2

## 2023-04-02 MED ORDER — FUROSEMIDE 10 MG/ML IJ SOLN
40.0000 mg | Freq: Once | INTRAMUSCULAR | Status: AC
  Administered 2023-04-02: 40 mg via INTRAVENOUS
  Filled 2023-04-02: qty 4

## 2023-04-02 MED ORDER — FAMOTIDINE 20 MG PO TABS
40.0000 mg | ORAL_TABLET | Freq: Every day | ORAL | Status: DC
  Administered 2023-04-02 – 2023-04-04 (×2): 40 mg via ORAL
  Filled 2023-04-02 (×4): qty 2

## 2023-04-02 MED ORDER — RANOLAZINE ER 500 MG PO TB12
500.0000 mg | ORAL_TABLET | Freq: Two times a day (BID) | ORAL | Status: DC
  Administered 2023-04-02 – 2023-04-04 (×4): 500 mg via ORAL
  Filled 2023-04-02 (×7): qty 1

## 2023-04-02 MED ORDER — SENNOSIDES-DOCUSATE SODIUM 8.6-50 MG PO TABS: 1.0000 | ORAL_TABLET | Freq: Every evening | ORAL | Status: DC | PRN

## 2023-04-02 MED ORDER — BENZONATATE 100 MG PO CAPS: 100.0000 mg | ORAL_CAPSULE | Freq: Once | ORAL | Status: DC

## 2023-04-02 MED ORDER — ISOSORBIDE MONONITRATE ER 30 MG PO TB24
15.0000 mg | ORAL_TABLET | Freq: Every day | ORAL | Status: DC
  Administered 2023-04-02 – 2023-04-04 (×3): 15 mg via ORAL
  Filled 2023-04-02 (×3): qty 1

## 2023-04-02 MED ORDER — ACETAMINOPHEN 325 MG PO TABS
650.0000 mg | ORAL_TABLET | Freq: Four times a day (QID) | ORAL | Status: DC | PRN
  Administered 2023-04-02: 650 mg via ORAL
  Filled 2023-04-02: qty 2

## 2023-04-02 MED ORDER — PREDNISONE 20 MG PO TABS
40.0000 mg | ORAL_TABLET | Freq: Every day | ORAL | Status: DC
  Administered 2023-04-03 – 2023-04-04 (×2): 40 mg via ORAL
  Filled 2023-04-02 (×2): qty 2

## 2023-04-02 MED ORDER — METOPROLOL SUCCINATE ER 100 MG PO TB24
200.0000 mg | ORAL_TABLET | Freq: Every day | ORAL | Status: DC
  Administered 2023-04-02 – 2023-04-04 (×3): 200 mg via ORAL
  Filled 2023-04-02 (×3): qty 2

## 2023-04-02 MED ORDER — IPRATROPIUM-ALBUTEROL 0.5-2.5 (3) MG/3ML IN SOLN
3.0000 mL | Freq: Four times a day (QID) | RESPIRATORY_TRACT | Status: DC
  Administered 2023-04-02 (×2): 3 mL via RESPIRATORY_TRACT
  Filled 2023-04-02 (×2): qty 3

## 2023-04-02 MED ORDER — ACETAMINOPHEN 650 MG RE SUPP: 650.0000 mg | Freq: Four times a day (QID) | RECTAL | Status: DC | PRN

## 2023-04-02 MED ORDER — IPRATROPIUM-ALBUTEROL 0.5-2.5 (3) MG/3ML IN SOLN: 3.0000 mL | Freq: Two times a day (BID) | RESPIRATORY_TRACT | Status: DC

## 2023-04-02 MED ORDER — PNEUMOCOCCAL 20-VAL CONJ VACC 0.5 ML IM SUSY
0.5000 mL | PREFILLED_SYRINGE | INTRAMUSCULAR | Status: AC | PRN
  Administered 2023-04-04: 0.5 mL via INTRAMUSCULAR
  Filled 2023-04-02: qty 0.5

## 2023-04-02 MED ORDER — HYDRALAZINE HCL 10 MG PO TABS: 10.0000 mg | ORAL_TABLET | Freq: Three times a day (TID) | ORAL | Status: DC

## 2023-04-02 MED ORDER — SODIUM CHLORIDE 0.9 % IV BOLUS
1000.0000 mL | Freq: Once | INTRAVENOUS | Status: AC
  Administered 2023-04-02: 1000 mL via INTRAVENOUS

## 2023-04-02 MED ORDER — MAGNESIUM SULFATE 2 GM/50ML IV SOLN
INTRAVENOUS | Status: AC
  Administered 2023-04-02: 2 g via INTRAVENOUS
  Filled 2023-04-02: qty 50

## 2023-04-02 MED ORDER — ASPIRIN 81 MG PO TBEC
81.0000 mg | DELAYED_RELEASE_TABLET | Freq: Every day | ORAL | Status: DC
  Administered 2023-04-02 – 2023-04-04 (×3): 81 mg via ORAL
  Filled 2023-04-02 (×3): qty 1

## 2023-04-02 MED ORDER — MAGNESIUM SULFATE 2 GM/50ML IV SOLN: 2.0000 g | Freq: Once | INTRAVENOUS | Status: AC

## 2023-04-02 MED ORDER — ATORVASTATIN CALCIUM 80 MG PO TABS
80.0000 mg | ORAL_TABLET | Freq: Every day | ORAL | Status: DC
  Administered 2023-04-02 – 2023-04-04 (×3): 80 mg via ORAL
  Filled 2023-04-02 (×3): qty 1

## 2023-04-02 MED ORDER — TICAGRELOR 90 MG PO TABS
90.0000 mg | ORAL_TABLET | Freq: Two times a day (BID) | ORAL | Status: DC
  Administered 2023-04-02 – 2023-04-04 (×5): 90 mg via ORAL
  Filled 2023-04-02 (×5): qty 1

## 2023-04-02 MED ORDER — BENZONATATE 100 MG PO CAPS
100.0000 mg | ORAL_CAPSULE | Freq: Three times a day (TID) | ORAL | Status: DC | PRN
  Administered 2023-04-03 (×2): 100 mg via ORAL
  Filled 2023-04-02 (×2): qty 1

## 2023-04-02 MED ORDER — ENOXAPARIN SODIUM 40 MG/0.4ML IJ SOSY
40.0000 mg | PREFILLED_SYRINGE | INTRAMUSCULAR | Status: DC
  Administered 2023-04-02: 40 mg via SUBCUTANEOUS
  Filled 2023-04-02: qty 0.4

## 2023-04-02 NOTE — H&P (Signed)
Date: 04/02/2023               Patient Name:  [name] MRN: [id number]  DOB: 1941/04/01 Age / Sex: 82 y.o., male   PCP: Claiborne Rigg, NP         Medical Service: Internal Medicine Teaching Service         Attending Physician: Dr. Dickie La, MD      First Contact: Annett Fabian, MD        Pager: Lurlean Nanny [phone]        Second Contact: Elza Rafter, DO    Pager: RA [phone]    Chief Complaint: shortness of breath  History of Present Illness:  Pt is a 82 year old male with PMHx of COPD, Combined HF and s/p CRT-D, CAD with stents, paroxysmal atrial fibrillation, NSVT, HTN and OSA presenting to the ED with shortness of breath.   Pt was on Bipap during evaluation. Pt states he had some coughing yesterday with minimal sputum production. He developed significant shortness of breath this morning prompting him to call EMS. He states he usually uses 2 pillows at baseline during sleep and felt like his breathing was not doing well with them overnight. He is a torsemide but states he does not take it often given it makes him urinate a lot. He only takes it when needed but when asked how does he know to take this medication, pt unable to give me an answer.   Review of Systems negative unless stated in the HPI.  In the ED, pt placed on Bipap with significant relief of his symptoms. EKG on was obtained and on my review does not show any abnormalities but ventricular paced rhythms from his pacemaker. CXR on my review shows hyper-inflated lungs and pulmonary edema. Pt was given  lasix 40 mg. Although 1 L bolus is documented per EDP this was not given but ran with magnesium and for only 100 cc.   Past Medical History: Past Medical History:  Diagnosis Date   Acute renal failure (HCC) 05/31/2014   Angina decubitus 06/15/2014   CAD (coronary artery disease)    a. s/p stent RCA DES 9/09; b. 2014 Attempted PCI of OM1 @ [place];  c. 04/2014 Cath: LAD 40-50p, D1 95-99 (chronic),  LCX 30-40 inf branch, OM1 CTO, RCA 30-40p, RCA patent stent, EF 35%->Med Rx.   Chronic combined systolic and diastolic CHF (congestive heart failure) (HCC)    a. EF about 40 to 45% per echo in April 2013;  b. 04/2014 Echo: EF 20-25%, sev LVH, sev glob HK, Gr 1 DD, mildly reduced RV fxn, PASP . c. 01/2017: EF improved to 50-55%.    Chronic low back pain    Complete heart block (HCC)    COPD (chronic obstructive pulmonary disease) (HCC) dx 06/2013   PFTs 07/08/13: mod obst with resp to bronchodilator, moderate decrease diffusion, airtrapping   COPD with acute exacerbation (HCC) 01/17/2017   COPD with asthma    07/08/13 PFT: FEV1 1.74L (66% pred, 30% change with BD), mod obst with resp to bronchodilator, moderate decrease diffusion, air-trapping 11/2013 Simple spiro>> clear obstruction, FEV1 1.30 L (47% pred) - trial of symbicort 160 2bid 01/26/15      Depression 09/15/2018   Assessment: Increased sadness and depression since losing job  Plan: Patient denies suicidal and homicidal ideations Patient would not like to start medication therapies at this time Will establish patient with community health and wellness for primary care  Dyslipidemia    a. on statin   Essential hypertension    HLD (hyperlipidemia)    HTN (hypertension)    a. Reports intolerance to hydralazine; b. no beta blockers 2/2 bradycardia;  c. failed on ACE and ARB.   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 04/2014 Echo: EF 20-25%, sev glob HK.   Mixed Ischemic/Non-Ischemic Cardiomyopathy    probable mixed ischemic and non-ischemic    Noncompliance    OSA (obstructive sleep apnea) 04/18/2018   06/11/16 - home sleep study shows AHI of 2.9 an hour with the lowest SaO2 of 79% with an average of 93%  03/08/2018-Home sleep study-AHI 7/HR, SaO2 low 81%    Paroxysmal atrial  fibrillation (HCC)    a. identified on device interrogation 01/2016   Pneumonia 09/02/2013   Pulmonary edema 05/10/2014   Respiratory arrest (HCC) 05/18/2014   Sinus bradycardia 05/31/2014   Ventricular tachycardia (HCC)    treated with ATP,  CL 250-300 msec    Meds: Current Outpatient Medications  Medication Instructions   acetaminophen (TYLENOL) 650 mg, Oral, Every 8 hours PRN   albuterol (PROVENTIL) 2.5 mg, Nebulization, Every 6 hours PRN   albuterol (VENTOLIN HFA) 108 (90 Base) MCG/ACT inhaler 1-2 puffs, Inhalation, Every 6 hours PRN   aspirin EC 81 mg, Oral, Daily, Swallow whole.   atorvastatin (LIPITOR) 80 MG tablet Take 1 tablet (80 mg total) by mouth once daily.   Brilinta 90 mg, Oral, 2 times daily   famotidine (PEPCID) 40 mg, Oral, Daily   Glycopyrrolate-Formoterol (BEVESPI AEROSPHERE) 9-4.8 MCG/ACT AERO 2 puffs, Inhalation, 2 times daily, NEEDS PASS   hydrALAZINE (APRESOLINE) 25 mg, Oral, 3 times daily   ipratropium-albuterol (DUONEB) 0.5-2.5 (3) MG/3ML SOLN 3 mLs, Nebulization, Every 6 hours PRN   isosorbide mononitrate (IMDUR) 15 mg, Oral, Daily   metoprolol (TOPROL-XL) 200 mg, Oral, Daily, Take with or immediately following a meal.   mometasone-formoterol (DULERA) 200-5 MCG/ACT AERO 2 puffs, Inhalation, 2 times daily   nitroGLYCERIN (NITROSTAT) 0.4 mg, Sublingual, Every 5 min PRN   potassium chloride (KLOR-CON M) 10 MEQ tablet 10 mEq, Oral, Daily   ranolazine (RANEXA) 500 mg, Oral, 2 times daily   torsemide (DEMADEX) 20 mg, Oral, Daily    Allergies: Allergies as of 04/02/2023 - Review Complete 04/02/2023  Allergen Reaction Noted   Acyclovir and related  08/12/2014   Aspirin Other (See Comments) 12/13/2011    Past Surgical History: Past Surgical History:  Procedure Laterality Date   BI-VENTRICULAR IMPLANTABLE CARDIOVERTER DEFIBRILLATOR N/A 08/12/2014   MDT Ovidio Kin XT CRTD implanted by Dr Johney Frame   BIV ICD GENERATOR CHANGEOUT N/A 08/08/2021   Procedure: BIV ICD  GENERATOR CHANGEOUT;  Surgeon: Hillis Range, MD;  Location: [facility] INVASIVE CV LAB;  Service: Cardiovascular;  Laterality: N/A;   CARDIAC CATHETERIZATION     ejection fraction 50%   CORONARY STENT INTERVENTION N/A 12/10/2022   Procedure: CORONARY STENT INTERVENTION;  Surgeon: Swaziland, Peter M, MD;  Location: [facility] INVASIVE CV LAB;  Service: Cardiovascular;  Laterality: N/A;   CORONARY STENT INTERVENTION N/A 12/12/2022   Procedure: CORONARY STENT INTERVENTION;  Surgeon: Swaziland, Peter M, MD;  Location: [facility] INVASIVE CV LAB;  Service: Cardiovascular;  Laterality: N/A;   CORONARY ULTRASOUND/IVUS N/A 12/10/2022   Procedure: Intravascular Ultrasound/IVUS;  Surgeon: Swaziland, Peter M, MD;  Location: [facility] INVASIVE CV LAB;  Service: Cardiovascular;  Laterality: N/A;   CORONARY ULTRASOUND/IVUS N/A 12/12/2022   Procedure: Coronary Ultrasound/IVUS;  Surgeon: Swaziland, Peter M, MD;  Location: [facility] INVASIVE CV LAB;  Service: Cardiovascular;  Laterality: N/A;   LEFT HEART CATH AND CORONARY ANGIOGRAPHY N/A 12/10/2022   Procedure: LEFT HEART CATH AND CORONARY ANGIOGRAPHY;  Surgeon: Swaziland, Peter M, MD;  Location: [facility] INVASIVE CV LAB;  Service: Cardiovascular;  Laterality: N/A;   LEFT HEART CATHETERIZATION WITH CORONARY ANGIOGRAM N/A 05/08/2014   Procedure: LEFT HEART CATHETERIZATION WITH CORONARY ANGIOGRAM;  Surgeon: Micheline Chapman, MD;  Location: [facility] CATH LAB;  Service: Cardiovascular;  Laterality: N/A;    Family History:  Family History  Problem Relation Age of Onset   Heart attack Neg Hx    Stroke Neg Hx     Social History:  Lives with grandson.  Currently works as Office manager. States he walks a lot on his job.  Tobacco- significant hx >30 pack years but only using 1-2 cigarettes per day now.   States he does not drink alcohol and no illicit substance use.  IADLs/ADLs- can person independently at baseline   Physical Exam: Blood pressure (!) 181/79, pulse 64, temperature (!) 97.2 F (36.2 C), temperature source Axillary, resp.  rate 18, height 5\' 8"  (1.727 m), weight 67.5 kg, SpO2 100%. General: NAD, on bipap resting comfortably HENT: on Bipap, NCAT Lungs: diminished lung sounds bilaterally Cardiovascular: sinus rhythm, good pulses, no LE edema, no JVD Abdomen: No TTP, normal bowel sounds MSK: No asymmetry, good bulk and tone, normal strength Skin: no lesions on skin Neuro: alert and oriented x4 Psych: normal mood and normal affect  Diagnostics:     Latest Ref Rng & Units 04/02/2023    9:27 AM 04/02/2023    7:31 AM 02/28/2023    9:47 AM  CBC  WBC 4.0 - 10.5 K/uL  6.0  5.2   Hemoglobin 13.0 - 17.0 g/dL 9.9  9.8  40.9   Hematocrit 39.0 - 52.0 % 29.0  31.7  34.9   Platelets 150 - 400 K/uL  140  146        Latest Ref Rng & Units 04/02/2023    9:27 AM 04/02/2023    7:31 AM 03/18/2023   12:00 AM  CMP  Glucose 70 - 99 mg/dL  90  85   BUN 8 - 23 mg/dL  15  17   Creatinine 8.11 - 1.24 mg/dL  9.14  7.82   Sodium 956 - 145 mmol/L 142  141  147   Potassium 3.5 - 5.1 mmol/L 3.7  3.8  4.2   Chloride 98 - 111 mmol/L  109  107   CO2 22 - 32 mmol/L  22  27   Calcium 8.9 - 10.3 mg/dL  8.5  9.1      Labs obtained showed stable anemia, no leukocytosis, and slight thrombocytopenia. BMP with baseline renal fxn with CKDIIIa and normal electrolytes except mild hypocalcemia. BNP with significant elevation at 1700. ABG was normal.   DG Chest Portable 1 View  Result Date: 04/02/2023 CLINICAL DATA:  Shortness of breath. EXAM: PORTABLE CHEST 1 VIEW COMPARISON:  March 30, 2023. FINDINGS: Stable cardiomegaly. Left-sided defibrillator is unchanged in position. Mild central pulmonary vascular congestion is noted. Mildly increased bibasilar opacities are noted concerning for atelectasis or possibly infiltrates. Bony thorax is unremarkable. IMPRESSION: Mild central pulmonary vascular congestion. Mildly increased bibasilar opacities are noted concerning for atelectasis or possibly infiltrates. Electronically Signed   By: Zenda Alpers.D.  On: 04/02/2023 07:59    Assessment & Plan by Problem:  Present on Admission:  Acute hypoxic respiratory failure (HCC)  Acute Hypoxic Respiratory Failure 2/2 CHF and COPD exacerbation Combined Systolic and Diastolic HF Pt presenting with acute hypoxic respiratory failure that appears to be multifactorial secondary to CHF exacerbation and to COPD exacerbation. Pt has 2/3 cardinal symptoms of COPD exacerbation so will treat him for this. Pt initially on bipap but weaned to 3 L oxygen. He does not use oxygen at baseline. Pt given solumedrol by the EMS, and I do not hear any wheeze noted by EDP so appears to have responded. Will treat with 4 days of oral prednisone 40 mg. Will hold home diuretics and start him on duonebs and budesonide nebulizer. Will not treat with azithromycin given pt's cardiac hx and prolonged Qtc. Will need additional lasix but appears he is sensitive and becomes dehydrated easily. Can give 1 more dose this evening and transition to oral home diuretics tomorrow. BP was significantly elevated at presentation but improved with one dose of IV lasix. Will start low dose BP medications if BP is >160/110. Can restart home inhalers tomorrow.   Chronic Problems:  HLD/CAD: On lipitor, aspirin, and brillinta. Will continue these. Will continue renexa and imdur. No symptoms of CP.  PAF: on metoprolol. No anticoagulation per pt's EP.  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 Will renally dose meds and avoid nephrotoxic meds.    DVT prophx: Lovenox Diet: HH Bowel: PRN Code: Full  Prior to Admission Living Arrangement: Home Anticipated Discharge Location: Home Barriers to Discharge: Medical Workup  Dispo: Admit patient to Observation with expected length of stay less than 2 midnights.  Gwenevere Abbot, MD Eligha Bridegroom. [facility] Medicine Res[facility], PGY-3 After 5 pm or weekends:  1st Contact: Pager: [phone]  2nd Contact: Pager: [phone]

## 2023-04-02 NOTE — Progress Notes (Signed)
MD notified of 8 beat run vt and pt wanting cough syrup and cough drops.

## 2023-04-02 NOTE — ED Provider Notes (Signed)
[place] [facility]MENT AT [facility] Provider Note   CSN: [id number] Arrival date & time: 04/02/23  4098     History  Chief Complaint  Patient presents with   Respiratory Distress    [name] is a 82 y.o. male with past medical history significant for hypertension, coronary artery disease, combined diastolic and systolic heart failure, biventricular cardioverter defibrillator, and pacemaker device, COPD, who presents with concern for respiratory distress.  Patient reports increased work of breathing starting this morning.  Wheezing endorsed by EMS arrival.  Received 2 DuoNebs, 125 mg Solu-Medrol with EMS prior to arrival.  Patient reports no significant improvement, 97% oxygen saturation on room air.  Patient denies any chest pain, hemoptysis, fever, chills.  HPI     Home Medications Prior to Admission medications   Medication Sig Start Date End Date Taking? Authorizing Provider  acetaminophen (TYLENOL) 650 MG CR tablet Take 650 mg by mouth every 8 (eight) hours as needed for pain.    [provider]  albuterol (PROVENTIL) (2.5 MG/3ML) 0.083% nebulizer solution Take 3 mLs (2.5 mg total) by nebulization every 6 (six) hours as needed for wheezing or shortness of breath. 07/11/22   Claiborne Rigg, NP  albuterol (VENTOLIN HFA) 108 (90 Base) MCG/ACT inhaler Inhale 1-2 puffs into the lungs every 6 (six) hours as needed for wheezing or shortness of breath. 11/13/21   Claiborne Rigg, NP  aspirin EC 81 MG tablet Take 1 tablet (81 mg total) by mouth daily. Swallow whole. 12/15/22   Perlie Gold, PA-C  atorvastatin (LIPITOR) 80 MG tablet Take 1 tablet (80 mg total) by mouth once daily. 02/20/22   [place], Peter M, MD  famotidine (PEPCID) 40 MG tablet Take 1 tablet (40 mg total) by mouth daily. 11/05/22   Claiborne Rigg, NP  Glycopyrrolate-Formoterol (BEVESPI AEROSPHERE) 9-4.8 MCG/ACT AERO Inhale 2 puffs into the lungs 2 (two) times daily. NEEDS PASS 07/12/22    Claiborne Rigg, NP  hydrALAZINE (APRESOLINE) 25 MG tablet Take 1 tablet (25 mg total) by mouth 3 (three) times daily. 02/06/23 02/06/24  Robbie Lis M, PA-C  ipratropium-albuterol (DUONEB) 0.5-2.5 (3) MG/3ML SOLN Take 3 mLs by nebulization every 6 (six) hours as needed (wheezing or shortness of breath). 09/14/22   Zenia Resides, MD  isosorbide mononitrate (IMDUR) 30 MG 24 hr tablet Take 0.5 tablets (15 mg total) by mouth daily. 02/06/23 02/06/24  Robbie Lis M, PA-C  metoprolol (TOPROL-XL) 200 MG 24 hr tablet Take 1 tablet (200 mg total) by mouth daily. Take with or immediately following a meal. 12/15/22 03/18/23  Perlie Gold, PA-C  mometasone-formoterol (DULERA) 200-5 MCG/ACT AERO Inhale 2 puffs into the lungs 2 (two) times daily. 07/11/22   Claiborne Rigg, NP  nitroGLYCERIN (NITROSTAT) 0.4 MG SL tablet Place 1 tablet (0.4 mg total) under the tongue every 5 (five) minutes as needed for chest pain. 12/08/21 04/27/24  Hoy Register, MD  potassium chloride (KLOR-CON M) 10 MEQ tablet Take 1 tablet (10 mEq total) by mouth daily. 02/06/23   Robbie Lis M, PA-C  ranolazine (RANEXA) 500 MG 12 hr tablet Take 1 tablet (500 mg total) by mouth 2 (two) times daily. 12/14/22   Perlie Gold, PA-C  ticagrelor (BRILINTA) 90 MG TABS tablet Take 1 tablet (90 mg total) by mouth 2 (two) times daily. 12/14/22 06/12/23  Perlie Gold, PA-C  torsemide (DEMADEX) 20 MG tablet Take 1 tablet (20 mg total) by mouth daily. Patient taking differently: Take 20 mg  by mouth daily as needed. Take daily as needed for weight gain of 3 lbs overnight or 5 lb in 1 week. 02/06/23   Allayne Butcher, PA-C      Allergies    Acyclovir and related and Aspirin    Review of Systems   Review of Systems  All other systems reviewed and are negative.   Physical Exam Updated Vital Signs BP (!) 168/87   Pulse 65   Temp 98.4 F (36.9 C) (Oral)   Resp 16   Ht 5\' 8"  (1.727 m)   Wt 67.5 kg   SpO2 100%   BMI 22.63  kg/m  Physical Exam Vitals and nursing note reviewed.  Constitutional:      General: He is in acute distress.     Appearance: Normal appearance. He is ill-appearing.  HENT:     Head: Normocephalic and atraumatic.  Eyes:     General:        Right eye: No discharge.        Left eye: No discharge.  Cardiovascular:     Rate and Rhythm: Regular rhythm. Tachycardia present.     Heart sounds: No murmur heard.    No friction rub. No gallop.  Pulmonary:     Effort: Respiratory distress present.     Comments: Increased work of breathing, belly breathing. Diffuse wheezing throughout Abdominal:     General: Bowel sounds are normal.     Palpations: Abdomen is soft.  Skin:    General: Skin is warm and dry.     Capillary Refill: Capillary refill takes less than 2 seconds.  Neurological:     Mental Status: He is alert and oriented to person, place, and time.  Psychiatric:        Mood and Affect: Mood normal.        Behavior: Behavior normal.     ED Results / Procedures / Treatments   Labs (all labs ordered are listed, but only abnormal results are displayed) Labs Reviewed  CBC - Abnormal; Notable for the following components:      Result Value   RBC 3.49 (*)    Hemoglobin 9.8 (*)    HCT 31.7 (*)    RDW 16.4 (*)    Platelets 140 (*)    All other components within normal limits  BASIC METABOLIC PANEL - Abnormal; Notable for the following components:   Creatinine, Ser 1.36 (*)    Calcium 8.5 (*)    GFR, Estimated 53 (*)    All other components within normal limits  BRAIN NATRIURETIC PEPTIDE - Abnormal; Notable for the following components:   B Natriuretic Peptide 1,708.7 (*)    All other components within normal limits  I-STAT ARTERIAL BLOOD GAS, ED - Abnormal; Notable for the following components:   HCT 29.0 (*)    Hemoglobin 9.9 (*)    All other components within normal limits  RESP PANEL BY RT-PCR (RSV, FLU A&B, COVID)  [id number]    EKG EKG  Interpretation Date/Time:  Tuesday April 02 2023 07:27:27 EDT Ventricular Rate:  92 PR Interval:  171 QRS Duration:  148 QT Interval:  415 QTC Calculation: 514 R Axis:   -82  Text Interpretation: VENTRICULAR PACED RHYTHM Confirmed by Rolan Bucco [phone]) on 04/02/2023 7:34:48 AM  Radiology DG Chest Portable 1 View  Result Date: 04/02/2023 CLINICAL DATA:  Shortness of breath. EXAM: PORTABLE CHEST 1 VIEW COMPARISON:  March 30, 2023. FINDINGS: Stable cardiomegaly. Left-sided defibrillator is unchanged in position. Mild central  pulmonary vascular congestion is noted. Mildly increased bibasilar opacities are noted concerning for atelectasis or possibly infiltrates. Bony thorax is unremarkable. IMPRESSION: Mild central pulmonary vascular congestion. Mildly increased bibasilar opacities are noted concerning for atelectasis or possibly infiltrates. Electronically Signed   By: Lupita Raider M.D.   On: 04/02/2023 07:59    Procedures .Critical Care  Performed by: Olene Floss, PA-C Authorized by: Olene Floss, PA-C   Critical care provider statement:    Critical care time (minutes):  35   Critical care was necessary to treat or prevent imminent or life-threatening deterioration of the following conditions:  Respiratory failure   Critical care was time spent personally by me on the following activities:  Development of treatment plan with patient or surrogate, discussions with consultants, evaluation of patient's response to treatment, examination of patient, ordering and review of laboratory studies, ordering and review of radiographic studies, ordering and performing treatments and interventions, pulse oximetry, re-evaluation of patient's condition and review of old charts   Care discussed with: admitting provider       Medications Ordered in ED Medications  magnesium sulfate IVPB 2 g 50 mL (0 g Intravenous Stopped 04/02/23 0817)  sodium chloride 0.9 % bolus 1,000 mL (0 mLs  Intravenous Stopped 04/02/23 0817)  furosemide (LASIX) injection 40 mg (40 mg Intravenous Given 04/02/23 2956)    ED Course/ Medical Decision Making/ A&P Clinical Course as of 04/02/23 1059  Tue Apr 02, 2023  0746 Accidentally ordered magnesium as drip -- we will rapidly administer and DC bolus [CP]  0812 DG Chest Portable 1 View [SW]    Clinical Course User Index [CP] Siriah Treat, Harrel Carina, PA-C [SW] Darnell Level, Student-PA                             Medical Decision Making Amount and/or Complexity of Data Reviewed Labs: ordered. Radiology: ordered.  Risk Prescription drug management.   This patient is a 82 y.o. male who presents to the ED for concern of shob, respiratory distress, this involves an extensive number of treatment options, and is a complaint that carries with it a high risk of complications and morbidity. The emergent differential diagnosis prior to evaluation includes, but is not limited to,  asthma exacerbation, COPD exacerbation, acute upper respiratory infection, acute bronchitis, chronic bronchitis, interstitial lung disease, ARDS, PE, pneumonia, atypical ACS, carbon monoxide poisoning, spontaneous pneumothorax, new CHF vs CHF exacerbation, versus other . This is not an exhaustive differential.   Past Medical History / Co-morbidities / Social History: hypertension, coronary artery disease, combined diastolic and systolic heart failure, biventricular cardioverter defibrillator, and pacemaker device, COPD  Additional history: Chart reviewed. Pertinent results include: Reviewed lab work, imaging, and previous cardiology procedures including previous cath and echo  Physical Exam: Physical exam performed. The pertinent findings include: Patient arrives in extremis with bilateral wheezing, increased respiratory effort, belly breathing, he is quite hypertensive on arrival, blood pressure 225/112, respiratory rate 34.  Stable oxygen saturation on his nonrebreather  at 6 L/min  Lab Tests: I ordered, and personally interpreted labs.  The pertinent results include: RVP negative for COVID, flu, BMP with elevated creatinine at 1.36, CBC notable for mild anemia, hemoglobin 9.8, ABG without acidosis or significant elevated pCO2.  His BNP is significantly elevated at 1700, increase suspicion of heart failure as the etiology for his respiratory distress although I think that COPD exacerbation is also involved given his wheezing  Imaging Studies: I ordered imaging studies including plain film chest x-ray which showed increased pulmonary congestion versus early infiltrate, clinical picture suggest likely secondary to pulmonary vascular congestion.   Cardiac Monitoring:  The patient was maintained on a cardiac monitor.  My attending physician Dr. Fredderick Phenix viewed and interpreted the cardiac monitored which showed an underlying rhythm of: paced rhythm, no significant change from baseline. I agree with this interpretation.   Medications: I ordered medication including Lasix for heart failure exacerbation, magnesium for respiratory stress, magnesium initially ordered as IV piggyback and so a small amount of fluid was administered but quickly discontinued once the order was corrected to rapid administration magnesium.   Consultations Obtained: I requested consultation with the hospitalist, spoke with Dr. Welton Flakes,  and discussed lab and imaging findings as well as pertinent plan - they recommend: Admission for acute respiratory distress secondary to COPD exacerbation with overlying heart failure exacerbation   Disposition: After consideration of the diagnostic results and the patients response to treatment, I feel that patient would benefit from admission for above.   I discussed this case with my attending physician Dr. Fredderick Phenix who cosigned this note including patient's presenting symptoms, physical exam, and planned diagnostics and interventions. Attending physician stated  agreement with plan or made changes to plan which were implemented.    Final Clinical Impression(s) / ED Diagnoses Final diagnoses:  COPD exacerbation [facility])    Rx / DC Orders ED Discharge Orders     None         [place] 04/02/23 1059    Rolan Bucco, MD 04/02/23 1607

## 2023-04-02 NOTE — ED Notes (Signed)
Pt placed on BiPAP

## 2023-04-02 NOTE — ED Triage Notes (Signed)
Pt from home with ems c.o resp distress.  Pt received 2 duonebs and 125 solumedrol with ems.  Pt was 97% on room air.  Pt arrives a.o

## 2023-04-02 NOTE — ED Notes (Signed)
ED TO INPATIENT HANDOFF REPORT  ED Nurse Name and Phone #: Theophilus Bones [phone]  S Name/Age/Gender [name] 82 y.o. male Room/Bed: 043C/043C  Code Status   Code Status: Full Code  Home/SNF/Other Home Patient oriented to: self, place, time, and situation Is this baseline? Yes   Triage Complete: Triage complete  Chief Complaint Acute hypoxic respiratory failure (HCC) [J96.01]  Triage Note Pt from home with ems c.o resp distress.  Pt received 2 duonebs and 125 solumedrol with ems.  Pt was 97% on room air.  Pt arrives a.o   Allergies Allergies  Allergen Reactions   Acyclovir And Related     GI upset    Aspirin Other (See Comments)    GI upset at high doses only.    Level of Care/Admitting Diagnosis ED Disposition     ED Disposition  Admit   Condition  --   Comment  Hospital Area: [facility] [[id number]]  Level of Care: Telemetry Medical [104]  May place patient in observation at [facility] or [facility] if equivalent level of care is available:: No  Covid Evaluation: Confirmed COVID Negative  Diagnosis: Acute hypoxic respiratory failure [facility]) [[id number]]  Admitting Physician: Dickie La [[id number]]  Attending Physician: Dickie La [[id number]]          B Medical/Surgery History Past Medical History:  Diagnosis Date   Acute renal failure (HCC) 05/31/2014   Angina decubitus 06/15/2014   CAD (coronary artery disease)    a. s/p stent RCA DES 9/09; b. 2014 Attempted PCI of OM1 @ [place];  c. 04/2014 Cath: LAD 40-50p, D1 95-99 (chronic), LCX 30-40 inf branch, OM1 CTO, RCA 30-40p, RCA patent stent, EF 35%->Med Rx.   Chronic combined systolic and diastolic CHF (congestive heart failure) (HCC)    a. EF about 40 to 45% per echo in April 2013;  b. 04/2014 Echo: EF 20-25%, sev LVH, sev glob HK, Gr 1 DD, mildly reduced RV fxn, PASP . c. 01/2017: EF improved to 50-55%.    Chronic low back pain    Complete heart block (HCC)    COPD (chronic  obstructive pulmonary disease) (HCC) dx 06/2013   PFTs 07/08/13: mod obst with resp to bronchodilator, moderate decrease diffusion, airtrapping   COPD with acute exacerbation (HCC) 01/17/2017   COPD with asthma    07/08/13 PFT: FEV1 1.74L (66% pred, 30% change with BD), mod obst with resp to bronchodilator, moderate decrease diffusion, air-trapping 11/2013 Simple spiro>> clear obstruction, FEV1 1.30 L (47% pred) - trial of symbicort 160 2bid 01/26/15      Depression 09/15/2018   Assessment: Increased sadness and depression since losing job  Plan: Patient denies suicidal and homicidal ideations Patient would not like to start medication therapies at this time Will establish patient with community health [facility] wellness for primary care   Dyslipidemia    a. on statin   Essential hypertension    HLD (hyperlipidemia)    HTN (hypertension)    a. Reports intolerance to hydralazine; b. no beta blockers 2/2 bradycardia;  c. failed on ACE and ARB.   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 04/2014 Echo: EF 20-25%, sev glob HK.   Mixed Ischemic/Non-Ischemic Cardiomyopathy    probable mixed ischemic and non-ischemic    Noncompliance    OSA (obstructive sleep apnea) 04/18/2018   06/11/16 -  home sleep study shows AHI of 2.9 an hour with the lowest SaO2 of 79% with an average of 93%  03/08/2018-Home sleep study-AHI 7/HR, SaO2 low 81%    Paroxysmal atrial fibrillation (HCC)    a. identified on device interrogation 01/2016   Pneumonia 09/02/2013   Pulmonary edema 05/10/2014   Respiratory arrest (HCC) 05/18/2014   Sinus bradycardia 05/31/2014   Ventricular tachycardia (HCC)    treated with ATP,  CL 250-300 msec   Past Surgical History:  Procedure Laterality Date   BI-VENTRICULAR IMPLANTABLE CARDIOVERTER DEFIBRILLATOR N/A 08/12/2014   MDT Ovidio Kin XT CRTD  implanted by Dr Johney Frame   BIV ICD GENERATOR CHANGEOUT N/A 08/08/2021   Procedure: BIV ICD GENERATOR CHANGEOUT;  Surgeon: Hillis Range, MD;  Location: [facility] INVASIVE CV LAB;  Service: Cardiovascular;  Laterality: N/A;   CARDIAC CATHETERIZATION     ejection fraction 50%   CORONARY STENT INTERVENTION N/A 12/10/2022   Procedure: CORONARY STENT INTERVENTION;  Surgeon: Swaziland, Peter M, MD;  Location: [facility] INVASIVE CV LAB;  Service: Cardiovascular;  Laterality: N/A;   CORONARY STENT INTERVENTION N/A 12/12/2022   Procedure: CORONARY STENT INTERVENTION;  Surgeon: Swaziland, Peter M, MD;  Location: [facility] INVASIVE CV LAB;  Service: Cardiovascular;  Laterality: N/A;   CORONARY ULTRASOUND/IVUS N/A 12/10/2022   Procedure: Intravascular Ultrasound/IVUS;  Surgeon: Swaziland, Peter M, MD;  Location: [facility] INVASIVE CV LAB;  Service: Cardiovascular;  Laterality: N/A;   CORONARY ULTRASOUND/IVUS N/A 12/12/2022   Procedure: Coronary Ultrasound/IVUS;  Surgeon: Swaziland, Peter M, MD;  Location: [facility] INVASIVE CV LAB;  Service: Cardiovascular;  Laterality: N/A;   LEFT HEART CATH AND CORONARY ANGIOGRAPHY N/A 12/10/2022   Procedure: LEFT HEART CATH AND CORONARY ANGIOGRAPHY;  Surgeon: Swaziland, Peter M, MD;  Location: [facility] INVASIVE CV LAB;  Service: Cardiovascular;  Laterality: N/A;   LEFT HEART CATHETERIZATION WITH CORONARY ANGIOGRAM N/A 05/08/2014   Procedure: LEFT HEART CATHETERIZATION WITH CORONARY ANGIOGRAM;  Surgeon: Micheline Chapman, MD;  Location: [facility] CATH LAB;  Service: Cardiovascular;  Laterality: N/A;     A IV Location/Drains/Wounds Patient Lines/Drains/Airways Status     Active Line/Drains/Airways     Name Placement date Placement time Site Days   Peripheral IV 04/02/23 20 G Anterior;Left Forearm 04/02/23  0730  Forearm  less than 1            Intake/Output Last 24 hours  Intake/Output Summary (Last 24 hours) at 04/02/2023 1722 Last data filed at 04/02/2023 1116 Gross per 24 hour  Intake 50 ml  Output 800 ml  Net -750 ml     Labs/Imaging Results for orders placed or performed during the hospital encounter of 04/02/23 (from the past 48 hour(s))  CBC     Status: Abnormal   Collection Time: 04/02/23  7:31 AM  Result Value Ref Range   WBC 6.0 4.0 - 10.5 K/uL   RBC 3.49 (L) 4.22 - 5.81 MIL/uL   Hemoglobin 9.8 (L) 13.0 - 17.0 g/dL   HCT 53.6 (L) 64.4 - 03.4 %   MCV 90.8 80.0 - 100.0 fL   MCH 28.1 26.0 - 34.0 pg   MCHC 30.9 30.0 - 36.0 g/dL   RDW 74.2 (H) 59.5 - 63.8 %   Platelets 140 (L) 150 - 400 K/uL    Comment: REPEATED TO VERIFY   nRBC 0.0 0.0 - 0.2 %    Comment: Performed at [facility], [address] [place], [place] [address]  Basic metabolic panel     Status: Abnormal   Collection  Time: 04/02/23  7:31 AM  Result Value Ref Range   Sodium 141 135 - 145 mmol/L   Potassium 3.8 3.5 - 5.1 mmol/L   Chloride 109 98 - 111 mmol/L   CO2 22 22 - 32 mmol/L   Glucose, Bld 90 70 - 99 mg/dL    Comment: Glucose reference range applies only to samples taken after fasting for at least 8 hours.   BUN 15 8 - 23 mg/dL   Creatinine, Ser 7.82 (H) 0.61 - 1.24 mg/dL   Calcium 8.5 (L) 8.9 - 10.3 mg/dL   GFR, Estimated 53 (L) >60 mL/min    Comment: (NOTE) Calculated using the CKD-EPI Creatinine Equation (2021)    Anion gap 10 5 - 15    Comment: Performed at [facility], [address], [place], [place] [address]  Brain natriuretic peptide     Status: Abnormal   Collection Time: 04/02/23  7:31 AM  Result Value Ref Range   B Natriuretic Peptide 1,708.7 (H) 0.0 - 100.0 pg/mL    Comment: Performed at [facility], [address] [place], [place] [address]  TSH     Status: Abnormal   Collection Time: 04/02/23  7:31 AM  Result Value Ref Range   TSH 6.682 (H) 0.350 - 4.500 uIU/mL    Comment: Performed by a 3rd Generation assay with a functional sensitivity of <=0.01 uIU/mL. Performed at [facility] Lab, [address], [place], [place] [address]   Resp panel by RT-PCR (RSV, Flu A&B, Covid)  Anterior Nasal Swab     Status: None   Collection Time: 04/02/23  8:13 AM   Specimen: Anterior Nasal Swab  Result Value Ref Range   SARS Coronavirus 2 by RT PCR NEGATIVE NEGATIVE   Influenza A by PCR NEGATIVE NEGATIVE   Influenza B by PCR NEGATIVE NEGATIVE    Comment: (NOTE) The Xpert Xpress SARS-CoV-2/FLU/RSV plus assay is intended as an aid in the diagnosis of influenza from Nasopharyngeal swab specimens and should not be used as a sole basis for treatment. Nasal washings and aspirates are unacceptable for Xpert Xpress SARS-CoV-2/FLU/RSV testing.  Fact Sheet for Patients: BloggerCourse.com  Fact Sheet for Healthcare Providers: SeriousBroker.it  This test is not yet approved or cleared by the [place] FDA and has been authorized for detection and/or diagnosis of SARS-CoV-2 by FDA under an Emergency Use Authorization (EUA). This EUA will remain in effect (meaning this test can be used) for the duration of the COVID-19 declaration under Section 564(b)(1) of the Act, 21 U.S.C. section 360bbb-3(b)(1), unless the authorization is terminated or revoked.     Resp Syncytial Virus by PCR NEGATIVE NEGATIVE    Comment: (NOTE) Fact Sheet for Patients: BloggerCourse.com  Fact Sheet for Healthcare Providers: SeriousBroker.it  This test is not yet approved or cleared by the [place] FDA and has been authorized for detection and/or diagnosis of SARS-CoV-2 by FDA under an Emergency Use Authorization (EUA). This EUA will remain in effect (meaning this test can be used) for the duration of the COVID-19 declaration under Section 564(b)(1) of the Act, 21 U.S.C. section 360bbb-3(b)(1), unless the authorization is terminated or revoked.  Performed at [facility], [address] [place], [place] [address]   I-Stat arterial blood gas, ED [facility] ED, [facility], [facility])     Status: Abnormal    Collection Time: 04/02/23  9:27 AM  Result Value Ref Range   pH, Arterial 7.375 7.35 - 7.45   pCO2 arterial  41.0 32 - 48 mmHg   pO2, Arterial 103 83 - 108 mmHg   Bicarbonate 24.0 20.0 - 28.0 mmol/L   TCO2 25 22 - 32 mmol/L   O2 Saturation 98 %   Acid-base deficit 1.0 0.0 - 2.0 mmol/L   Sodium 142 135 - 145 mmol/L   Potassium 3.7 3.5 - 5.1 mmol/L   Calcium, Ion 1.16 1.15 - 1.40 mmol/L   HCT 29.0 (L) 39.0 - 52.0 %   Hemoglobin 9.9 (L) 13.0 - 17.0 g/dL   Patient temperature 04.5 F    Collection site RADIAL, ALLEN'S TEST ACCEPTABLE    Drawn by RT    Sample type ARTERIAL   Troponin I (High Sensitivity)     Status: Abnormal   Collection Time: 04/02/23  4:06 PM  Result Value Ref Range   Troponin I (High Sensitivity) 49 (H) <18 ng/L    Comment: (NOTE) Elevated high sensitivity troponin I (hsTnI) values and significant  changes across serial measurements may suggest ACS but many other  chronic and acute conditions are known to elevate hsTnI results.  Refer to the "Links" section for chest pain algorithms and additional  guidance. Performed at [facility], [address], [place], [place] [address]   T4, free     Status: Abnormal   Collection Time: 04/02/23  4:06 PM  Result Value Ref Range   Free T4 1.27 (H) 0.61 - 1.12 ng/dL    Comment: (NOTE) Biotin ingestion may interfere with free T4 tests. If the results are inconsistent with the TSH level, previous test results, or the clinical presentation, then consider biotin interference. If needed, order repeat testing after stopping biotin. Performed at [facility], [address] [place], [place] [address]    DG Chest Portable 1 View  Result Date: 04/02/2023 CLINICAL DATA:  Shortness of breath. EXAM: PORTABLE CHEST 1 VIEW COMPARISON:  March 30, 2023. FINDINGS: Stable cardiomegaly. Left-sided defibrillator is unchanged in position. Mild central pulmonary vascular congestion is noted. Mildly increased bibasilar opacities  are noted concerning for atelectasis or possibly infiltrates. Bony thorax is unremarkable. IMPRESSION: Mild central pulmonary vascular congestion. Mildly increased bibasilar opacities are noted concerning for atelectasis or possibly infiltrates. Electronically Signed   By: Lupita Raider M.D.   On: 04/02/2023 07:59    Pending Labs Unresulted Labs (From admission, onward)     Start     Ordered   04/03/23 0500  CBC  Tomorrow morning,   R        04/02/23 1207   04/03/23 0500  Comprehensive metabolic panel  Tomorrow morning,   R        04/02/23 1303            Vitals/Pain Today's Vitals   04/02/23 1500 04/02/23 1530 04/02/23 1600 04/02/23 1605  BP: (!) 158/87 (!) 193/97 (!) 166/95   Pulse: 69 86 73   Resp: 19 18 (!) 21   Temp:    99 F (37.2 C)  TempSrc:      SpO2: 95% 99% 98%   Weight:      Height:      PainSc:        Isolation Precautions No active isolations  Medications Medications  enoxaparin (LOVENOX) injection 40 mg (40 mg Subcutaneous Given 04/02/23 1708)  acetaminophen (TYLENOL) tablet 650 mg (has no administration in time range)    Or  acetaminophen (TYLENOL) suppository 650 mg (has no administration in time range)  senna-docusate (Senokot-S) tablet 1 tablet (  has no administration in time range)  aspirin EC tablet 81 mg (81 mg Oral Given 04/02/23 1354)  atorvastatin (LIPITOR) tablet 80 mg (80 mg Oral Given 04/02/23 1354)  famotidine (PEPCID) tablet 40 mg (40 mg Oral Given 04/02/23 1354)  ranolazine (RANEXA) 12 hr tablet 500 mg (500 mg Oral Given 04/02/23 1354)  ticagrelor (BRILINTA) tablet 90 mg (90 mg Oral Given 04/02/23 1356)  isosorbide mononitrate (IMDUR) 24 hr tablet 15 mg (15 mg Oral Given 04/02/23 1356)  metoprolol succinate (TOPROL-XL) 24 hr tablet 200 mg (200 mg Oral Given 04/02/23 1353)  ipratropium-albuterol (DUONEB) 0.5-2.5 (3) MG/3ML nebulizer solution 3 mL (3 mLs Nebulization Given 04/02/23 1336)  budesonide (PULMICORT) nebulizer solution 0.5 mg (0.5 mg  Nebulization Given 04/02/23 1336)  predniSONE (DELTASONE) tablet 40 mg (has no administration in time range)  magnesium sulfate IVPB 2 g 50 mL (0 g Intravenous Stopped 04/02/23 0817)  sodium chloride 0.9 % bolus 1,000 mL (0 mLs Intravenous Stopped 04/02/23 0817)  furosemide (LASIX) injection 40 mg (40 mg Intravenous Given 04/02/23 0938)  furosemide (LASIX) injection 40 mg (40 mg Intravenous Given 04/02/23 1553)    Mobility walks     Focused Assessments Pulmonary Assessment Handoff:  Lung sounds:   O2 Device: (S) Nasal Cannula (pt. taken off bipap per. MD request and placed on [place] 3L. Tolerating well, no distress noted) O2 Flow Rate (L/min): (S) 3 L/min    R Recommendations: See Admitting Provider Note  Report given to:   Additional Notes:

## 2023-04-02 NOTE — ED Notes (Signed)
RT aware of ABG.

## 2023-04-02 NOTE — Plan of Care (Signed)

## 2023-04-02 NOTE — Progress Notes (Signed)
Tele called to report 9 beat v tach.  Pt now v paced.  Pt assessed and he denies any distress but did report that he started coughing recently about the same time of the event. Bp 149/78, HR 61, resp 18,  temp 99.  Pt has generalized aches/pains.  Tylenol 650mg  given po.  Cont to monitor.

## 2023-04-02 NOTE — Progress Notes (Signed)
pt. taken off bipap per. MD request and placed on [place] 3L. Tolerating well, no distress noted at this time

## 2023-04-03 ENCOUNTER — Observation Stay (HOSPITAL_COMMUNITY): Payer: Medicare Other

## 2023-04-03 DIAGNOSIS — I480 Paroxysmal atrial fibrillation: Secondary | ICD-10-CM | POA: Diagnosis present

## 2023-04-03 DIAGNOSIS — Z7982 Long term (current) use of aspirin: Secondary | ICD-10-CM | POA: Diagnosis not present

## 2023-04-03 DIAGNOSIS — J441 Chronic obstructive pulmonary disease with (acute) exacerbation: Secondary | ICD-10-CM

## 2023-04-03 DIAGNOSIS — I5043 Acute on chronic combined systolic (congestive) and diastolic (congestive) heart failure: Secondary | ICD-10-CM | POA: Diagnosis present

## 2023-04-03 DIAGNOSIS — I130 Hypertensive heart and chronic kidney disease with heart failure and stage 1 through stage 4 chronic kidney disease, or unspecified chronic kidney disease: Secondary | ICD-10-CM | POA: Diagnosis present

## 2023-04-03 DIAGNOSIS — Z955 Presence of coronary angioplasty implant and graft: Secondary | ICD-10-CM | POA: Diagnosis not present

## 2023-04-03 DIAGNOSIS — Z7951 Long term (current) use of inhaled steroids: Secondary | ICD-10-CM | POA: Diagnosis not present

## 2023-04-03 DIAGNOSIS — Z79899 Other long term (current) drug therapy: Secondary | ICD-10-CM | POA: Diagnosis not present

## 2023-04-03 DIAGNOSIS — J9601 Acute respiratory failure with hypoxia: Secondary | ICD-10-CM | POA: Diagnosis present

## 2023-04-03 DIAGNOSIS — Z1152 Encounter for screening for COVID-19: Secondary | ICD-10-CM | POA: Diagnosis not present

## 2023-04-03 DIAGNOSIS — Z23 Encounter for immunization: Secondary | ICD-10-CM | POA: Diagnosis present

## 2023-04-03 DIAGNOSIS — I2510 Atherosclerotic heart disease of native coronary artery without angina pectoris: Secondary | ICD-10-CM | POA: Diagnosis present

## 2023-04-03 DIAGNOSIS — N1831 Chronic kidney disease, stage 3a: Secondary | ICD-10-CM | POA: Diagnosis present

## 2023-04-03 DIAGNOSIS — F40240 Claustrophobia: Secondary | ICD-10-CM | POA: Diagnosis present

## 2023-04-03 DIAGNOSIS — Z886 Allergy status to analgesic agent status: Secondary | ICD-10-CM | POA: Diagnosis not present

## 2023-04-03 DIAGNOSIS — I509 Heart failure, unspecified: Secondary | ICD-10-CM

## 2023-04-03 DIAGNOSIS — I161 Hypertensive emergency: Secondary | ICD-10-CM | POA: Diagnosis present

## 2023-04-03 DIAGNOSIS — E872 Acidosis, unspecified: Secondary | ICD-10-CM | POA: Diagnosis present

## 2023-04-03 DIAGNOSIS — E785 Hyperlipidemia, unspecified: Secondary | ICD-10-CM | POA: Diagnosis present

## 2023-04-03 DIAGNOSIS — Z7902 Long term (current) use of antithrombotics/antiplatelets: Secondary | ICD-10-CM | POA: Diagnosis not present

## 2023-04-03 DIAGNOSIS — I255 Ischemic cardiomyopathy: Secondary | ICD-10-CM | POA: Diagnosis present

## 2023-04-03 DIAGNOSIS — F32A Depression, unspecified: Secondary | ICD-10-CM | POA: Diagnosis present

## 2023-04-03 DIAGNOSIS — Z87891 Personal history of nicotine dependence: Secondary | ICD-10-CM | POA: Diagnosis not present

## 2023-04-03 DIAGNOSIS — I428 Other cardiomyopathies: Secondary | ICD-10-CM | POA: Diagnosis present

## 2023-04-03 DIAGNOSIS — I5042 Chronic combined systolic (congestive) and diastolic (congestive) heart failure: Secondary | ICD-10-CM

## 2023-04-03 DIAGNOSIS — J9602 Acute respiratory failure with hypercapnia: Secondary | ICD-10-CM | POA: Diagnosis present

## 2023-04-03 LAB — POCT I-STAT 7, (LYTES, BLD GAS, ICA,H+H)
Acid-base deficit: 3 mmol/L — ABNORMAL HIGH (ref 0.0–2.0)
Bicarbonate: 24.4 mmol/L (ref 20.0–28.0)
Calcium, Ion: 1.21 mmol/L (ref 1.15–1.40)
HCT: 36 % — ABNORMAL LOW (ref 39.0–52.0)
Hemoglobin: 12.2 g/dL — ABNORMAL LOW (ref 13.0–17.0)
O2 Saturation: 97 %
Patient temperature: 98.6
Potassium: 5.6 mmol/L — ABNORMAL HIGH (ref 3.5–5.1)
Sodium: 138 mmol/L (ref 135–145)
TCO2: 26 mmol/L (ref 22–32)
pCO2 arterial: 51.9 mmHg — ABNORMAL HIGH (ref 32–48)
pH, Arterial: 7.28 — ABNORMAL LOW (ref 7.35–7.45)
pO2, Arterial: 108 mmHg (ref 83–108)

## 2023-04-03 LAB — CBC
HCT: 33.4 % — ABNORMAL LOW (ref 39.0–52.0)
Hemoglobin: 10.3 g/dL — ABNORMAL LOW (ref 13.0–17.0)
MCH: 27 pg (ref 26.0–34.0)
MCHC: 30.8 g/dL (ref 30.0–36.0)
MCV: 87.4 fL (ref 80.0–100.0)
Platelets: 157 10*3/uL (ref 150–400)
RBC: 3.82 MIL/uL — ABNORMAL LOW (ref 4.22–5.81)
RDW: 16.4 % — ABNORMAL HIGH (ref 11.5–15.5)
WBC: 10.1 10*3/uL (ref 4.0–10.5)
nRBC: 0 % (ref 0.0–0.2)

## 2023-04-03 LAB — COMPREHENSIVE METABOLIC PANEL
ALT: 101 U/L — ABNORMAL HIGH (ref 0–44)
ALT: 90 U/L — ABNORMAL HIGH (ref 0–44)
AST: 71 U/L — ABNORMAL HIGH (ref 15–41)
AST: 57 U/L — ABNORMAL HIGH (ref 15–41)
Albumin: 4 g/dL (ref 3.5–5.0)
Albumin: 4 g/dL (ref 3.5–5.0)
Alkaline Phosphatase: 137 U/L — ABNORMAL HIGH (ref 38–126)
Alkaline Phosphatase: 128 U/L — ABNORMAL HIGH (ref 38–126)
Anion gap: 12 (ref 5–15)
Anion gap: 10 (ref 5–15)
BUN: 29 mg/dL — ABNORMAL HIGH (ref 8–23)
BUN: 32 mg/dL — ABNORMAL HIGH (ref 8–23)
CO2: 27 mmol/L (ref 22–32)
CO2: 26 mmol/L (ref 22–32)
Calcium: 9.1 mg/dL (ref 8.9–10.3)
Calcium: 8.9 mg/dL (ref 8.9–10.3)
Chloride: 98 mmol/L (ref 98–111)
Chloride: 102 mmol/L (ref 98–111)
Creatinine, Ser: 1.96 mg/dL — ABNORMAL HIGH (ref 0.61–1.24)
Creatinine, Ser: 1.97 mg/dL — ABNORMAL HIGH (ref 0.61–1.24)
GFR, Estimated: 34 mL/min — ABNORMAL LOW (ref >60)
GFR, Estimated: 34 mL/min — ABNORMAL LOW (ref >60)
Glucose, Bld: 146 mg/dL — ABNORMAL HIGH (ref 70–99)
Glucose, Bld: 134 mg/dL — ABNORMAL HIGH (ref 70–99)
Potassium: 4.2 mmol/L (ref 3.5–5.1)
Potassium: 4.1 mmol/L (ref 3.5–5.1)
Sodium: 137 mmol/L (ref 135–145)
Sodium: 138 mmol/L (ref 135–145)
Total Bilirubin: 0.9 mg/dL (ref 0.3–1.2)
Total Bilirubin: 0.7 mg/dL (ref 0.3–1.2)
Total Protein: 7.4 g/dL (ref 6.5–8.1)
Total Protein: 7.3 g/dL (ref 6.5–8.1)

## 2023-04-03 LAB — ECHOCARDIOGRAM COMPLETE
Area-P 1/2: 3.91 cm2
Calc EF: 42.5 %
Height: 68 in
MV M vel: 5.39 m/s
MV Peak grad: 116.2 mmHg
Radius: 0.3 cm
S' Lateral: 3.5 cm
Single Plane A2C EF: 48.2 %
Single Plane A4C EF: 36.1 %
Weight: 2321 oz

## 2023-04-03 LAB — BLOOD GAS, VENOUS
Acid-Base Excess: 4.2 mmol/L — ABNORMAL HIGH (ref 0.0–2.0)
Bicarbonate: 28.4 mmol/L — ABNORMAL HIGH (ref 20.0–28.0)
Drawn by: 6364
O2 Saturation: 69 %
Patient temperature: 36.8
pCO2, Ven: 40 mmHg — ABNORMAL LOW (ref 44–60)
pH, Ven: 7.46 — ABNORMAL HIGH (ref 7.25–7.43)
pO2, Ven: 37 mmHg (ref 32–45)

## 2023-04-03 LAB — MAGNESIUM
Magnesium: 2.1 mg/dL (ref 1.7–2.4)
Magnesium: 2.1 mg/dL (ref 1.7–2.4)

## 2023-04-03 LAB — HEPATITIS PANEL, ACUTE
HCV Ab: NONREACTIVE
Hep A IgM: NONREACTIVE
Hep B C IgM: NONREACTIVE
Hepatitis B Surface Ag: NONREACTIVE

## 2023-04-03 MED ORDER — HYDRALAZINE HCL 20 MG/ML IJ SOLN: 2.0000 mg | Freq: Once | INTRAMUSCULAR | Status: DC

## 2023-04-03 MED ORDER — TORSEMIDE 20 MG PO TABS
20.0000 mg | ORAL_TABLET | Freq: Every day | ORAL | Status: DC
  Administered 2023-04-03: 20 mg via ORAL
  Filled 2023-04-03: qty 1

## 2023-04-03 MED ORDER — METHYLPREDNISOLONE SODIUM SUCC 125 MG IJ SOLR
125.0000 mg | Freq: Once | INTRAMUSCULAR | Status: AC
  Administered 2023-04-03: 125 mg via INTRAVENOUS
  Filled 2023-04-03: qty 2

## 2023-04-03 MED ORDER — FUROSEMIDE 10 MG/ML IJ SOLN
20.0000 mg | Freq: Once | INTRAMUSCULAR | Status: AC
  Administered 2023-04-03: 20 mg via INTRAVENOUS
  Filled 2023-04-03: qty 2

## 2023-04-03 MED ORDER — HYDRALAZINE HCL 50 MG PO TABS
50.0000 mg | ORAL_TABLET | Freq: Three times a day (TID) | ORAL | Status: DC
  Administered 2023-04-03 – 2023-04-04 (×3): 50 mg via ORAL
  Filled 2023-04-03 (×3): qty 1

## 2023-04-03 MED ORDER — HYDROXYZINE HCL 50 MG/ML IM SOLN
25.0000 mg | Freq: Four times a day (QID) | INTRAMUSCULAR | Status: DC | PRN
  Administered 2023-04-03: 25 mg via INTRAMUSCULAR
  Filled 2023-04-03 (×2): qty 0.5

## 2023-04-03 MED ORDER — SODIUM ZIRCONIUM CYCLOSILICATE 5 G PO PACK
5.0000 g | PACK | Freq: Once | ORAL | Status: DC
  Filled 2023-04-03: qty 1

## 2023-04-03 MED ORDER — ALBUTEROL SULFATE (2.5 MG/3ML) 0.083% IN NEBU: 2.5000 mg | INHALATION_SOLUTION | RESPIRATORY_TRACT | Status: DC | PRN

## 2023-04-03 MED ORDER — IPRATROPIUM-ALBUTEROL 0.5-2.5 (3) MG/3ML IN SOLN
3.0000 mL | Freq: Four times a day (QID) | RESPIRATORY_TRACT | Status: DC
  Administered 2023-04-03 – 2023-04-04 (×6): 3 mL via RESPIRATORY_TRACT
  Filled 2023-04-03 (×5): qty 3

## 2023-04-03 MED ORDER — MENTHOL 3 MG MT LOZG
1.0000 | LOZENGE | OROMUCOSAL | Status: DC | PRN
  Administered 2023-04-03: 3 mg via ORAL
  Filled 2023-04-03: qty 9

## 2023-04-03 MED ORDER — IPRATROPIUM-ALBUTEROL 0.5-2.5 (3) MG/3ML IN SOLN: 3.0000 mL | Freq: Four times a day (QID) | RESPIRATORY_TRACT | Status: DC

## 2023-04-03 MED ORDER — IPRATROPIUM-ALBUTEROL 0.5-2.5 (3) MG/3ML IN SOLN
3.0000 mL | Freq: Once | RESPIRATORY_TRACT | Status: AC
  Administered 2023-04-03: 3 mL via RESPIRATORY_TRACT
  Filled 2023-04-03: qty 3

## 2023-04-03 MED ORDER — HYDRALAZINE HCL 25 MG PO TABS
25.0000 mg | ORAL_TABLET | Freq: Three times a day (TID) | ORAL | Status: DC
  Administered 2023-04-03: 25 mg via ORAL
  Filled 2023-04-03: qty 1

## 2023-04-03 MED ORDER — ALBUTEROL SULFATE (2.5 MG/3ML) 0.083% IN NEBU
5.0000 mg | INHALATION_SOLUTION | Freq: Once | RESPIRATORY_TRACT | Status: AC
  Administered 2023-04-03: 5 mg via RESPIRATORY_TRACT
  Filled 2023-04-03: qty 6

## 2023-04-03 MED ORDER — HYDRALAZINE HCL 20 MG/ML IJ SOLN
10.0000 mg | Freq: Once | INTRAMUSCULAR | Status: AC
  Administered 2023-04-03: 10 mg via INTRAVENOUS
  Filled 2023-04-03: qty 1

## 2023-04-03 MED ORDER — ENOXAPARIN SODIUM 30 MG/0.3ML IJ SOSY
30.0000 mg | PREFILLED_SYRINGE | INTRAMUSCULAR | Status: DC
  Administered 2023-04-03: 30 mg via SUBCUTANEOUS
  Filled 2023-04-03: qty 0.3

## 2023-04-03 NOTE — Progress Notes (Addendum)
Got message from nurse at around 11pm notifying me that patient had an 8 beat run VT and pt wanting cough syrup and cough drops. He was SOB. Went to assess at bedside. He had wheezing on bilateral Right and Left upper, mid, and lower lobes. No rales on auscultation, did not appreciate a JVD although patient was moving constantly. Non-stop non-productive coughing and anxious. Increased O2 levels at bedside to 5L. Sat patient up. Cough diminished. Ordered duoneb, patient remained tachypneic with little change. Pt refused NRB due to claustrophobia. Solumedrol and hydroxyzine ordered. Went to reassess, RR 25, wheezing somewhat improved. +JVD. Furosemide 20mg  IV administered and he had 100cc of output with mild relief. Bipap and progressive orders placed.   RRT called Pt is on BIPAP 16/8 with TV 850 and RR 24. ABG (7.28/52/108/24). Albuterol given.   On reassessment patient was still tachypneic. K 5.6 unable to get PO Lokelma. Albuterol should help with K. Since he had some improvement with furosemide, 20mg  IV ordered again.   BP is now at 190s hydralazine 25mg  ordered.  5AM patient trasferred to progressive. EKG pending. Received IV furosemide. Goal it to keep O2 sat between 88-92% given that he has COPD.

## 2023-04-03 NOTE — Progress Notes (Signed)
Report called to [place], RN on [address] and then pt transferred via bed and bip[ap to room 9 on 4 [place].

## 2023-04-03 NOTE — Progress Notes (Signed)
  Echocardiogram 2D Echocardiogram has been performed.  [name] 04/03/2023, 3:03 PM

## 2023-04-03 NOTE — Progress Notes (Signed)
Pt has progressively gotten worse with his resp failure tonight starting at approx 2 am.  Pt has had anxiety and sob despite O2 sats 100%.  His O2 was increased to 4 liters [place], he refused NRB.  Pt was given Lasix IV and solumedrol IV w/o improvement.  RRT called by RT and bipap placed. ABG drawn.  Order for transfer to PCU.

## 2023-04-03 NOTE — Progress Notes (Signed)
Placed patient on Bipap due to increased shortness of breath. B/S decreased with tight ex wheezes. Albuterol tx given with bipap

## 2023-04-03 NOTE — Significant Event (Addendum)
Rapid Response Event Note   Reason for Call : Respiratory failure Initial Focused Assessment:  Notified by Tim RRT regarding pt with respiratory failure d/t COPD and CHF (EF 35-40% 4/24). Pt sitting in fowlers position in the bed, anxious but oriented and able to follow commands. BBS diminished and no completion of the expiratory phase during ventilations. Pt states he is having trouble getting his breath (even on BIPAP). Pt is on BIPAP 16/8 with TV 850 and RR 24.   0400-97.20F, HR 65, 190/72(103), RR 24 sats 100 on 40% on BIPAP   Interventions:  -BIPAP -ABG (7.28/52/108/24)  -Tx 4E09       MD Notified: per primary RN Call Time: 0344 Arrival Time: 4098 End Time: 0530  Rose Fillers, RN

## 2023-04-03 NOTE — Progress Notes (Signed)
.  Heart Failure Navigator Progress Note  Assessed for Heart & Vascular TOC clinic readiness.  Patient admission multifactorial with EF 35-40% Has scheduled [facility] follow up appointment on 04/23/2023.   Navigator will sign off at this time.   Rhae Hammock, BSN, Scientist, clinical (histocompatibility and immunogenetics) Only

## 2023-04-03 NOTE — Progress Notes (Addendum)
HD#0 SUBJECTIVE:  Patient Summary: [name] is a 82 y.o. male with a pertinent PMH of COPD, systolic and diastolic HF s/p CRT-D, CAD, pAF, hypertension, OSA, and CKD3a who presented with several days of progressive dyspnea and cough and was admitted for acute respiratory distress which we suspect is secondary to heart failure exacerbation, pulmonary edema in the setting of hypertensive emergency, and COPD exacerbation.   Overnight Events: Overnight resident was paged because patient was in respiratory distress with accessory muscle use and expressing anxiety.  He was given an additional dose of solumedrol, DuoNebs were changed to scheduled, and hydroxyzine was given for anxiety.  Respiratory therapy was called and BiPAP was used with improvement in status.  BiPAP was discontinued in the morning and hydralazine was restarted at 10 mg for his elevated blood pressure.  Interim History: He was evaluated at bedside this morning.  He was breathing well on nasal cannula with no evidence of respiratory distress.  He said he feels well overall and does not have any shortness of breath.  His only concern is fatigue, which he says is due to the rough night he had last night.  OBJECTIVE:  Vital Signs: Vitals:   04/03/23 1045 04/03/23 1226 04/03/23 1336 04/03/23 1411  BP: (!) 149/81 (!) 174/82    Pulse: 73  72   Resp:   20   Temp:  98.5 F (36.9 C)  98.4 F (36.9 C)  TempSrc:  Oral    SpO2:   100%   Weight:      Height:       Supplemental O2: Nasal Cannula SpO2: 100 % O2 Flow Rate (L/min): 2 L/min FiO2 (%): 28 %  Filed Weights   04/02/23 0742 04/03/23 0539  Weight: 67.5 kg 65.8 kg     Intake/Output Summary (Last 24 hours) at 04/03/2023 1514 Last data filed at 04/03/2023 1412 Gross per 24 hour  Intake 240 ml  Output 1000 ml  Net -760 ml   Net IO Since Admission: -1,510 mL [04/03/23 1514]  Physical Exam:  General: Well-appearing, sitting upright in bed in no acute  distress Pulm: Diffuse wheezes throughout, no accessory muscle use, normal respiratory effort Cardio: Regular rate and rhythm, no murmurs, no edema noted in the lower extremities Neuro: Alert and oriented to person, place, and time  Patient Lines/Drains/Airways Status     Active Line/Drains/Airways     Name Placement date Placement time Site Days   Peripheral IV 04/02/23 20 G Anterior;Left Forearm 04/02/23  0730  Forearm  1            Pertinent Labs:    Latest Ref Rng & Units 04/03/2023    6:30 AM 04/03/2023    3:56 AM 04/02/2023    9:27 AM  CBC  WBC 4.0 - 10.5 K/uL 10.1     Hemoglobin 13.0 - 17.0 g/dL 16.6  06.3  9.9   Hematocrit 39.0 - 52.0 % 33.4  36.0  29.0   Platelets 150 - 400 K/uL 157          Latest Ref Rng & Units 04/03/2023    6:30 AM 04/03/2023    3:56 AM 04/02/2023    9:27 AM  CMP  Glucose 70 - 99 mg/dL 016     BUN 8 - 23 mg/dL 29     Creatinine 0.10 - 1.24 mg/dL 9.32     Sodium 355 - 732 mmol/L 137  138  142   Potassium 3.5 - 5.1 mmol/L  4.2  5.6  3.7   Chloride 98 - 111 mmol/L 98     CO2 22 - 32 mmol/L 27     Calcium 8.9 - 10.3 mg/dL 9.1     Total Protein 6.5 - 8.1 g/dL 7.4     Total Bilirubin 0.3 - 1.2 mg/dL 0.9     Alkaline Phos 38 - 126 U/L 137     AST 15 - 41 U/L 71     ALT 0 - 44 U/L 101      Venous Blood Gas: pH 7.46, pCO2 40  Pertinent Imaging: DG CHEST PORT 1 VIEW  Result Date: 04/03/2023 CLINICAL DATA:  82 year old male with shortness of breath. EXAM: PORTABLE CHEST 1 VIEW COMPARISON:  Portable chest 04/02/2023 and earlier. FINDINGS: Portable AP semi upright view at 0809 hours. Larger lung volumes. Stable cardiomegaly and mediastinal contours. Stable left chest AICD. Allowing for portable technique the lungs are clear. No pneumothorax or pleural effusion. Visualized tracheal air column is within normal limits. No acute osseous abnormality identified. IMPRESSION: Stable cardiomegaly. No acute cardiopulmonary abnormality. Electronically Signed    By: Odessa Fleming M.D.   On: 04/03/2023 08:22    ASSESSMENT/PLAN:  Assessment: Principal Problem:   Acute respiratory distress Active Problems:   COPD with acute exacerbation (HCC)   Acute exacerbation of CHF (congestive heart failure) (HCC)   Hypertensive emergency   Acute hypoxic respiratory failure (HCC)   Plan: Acute hypoxic, hypercarbic respiratory failure In the setting of HFrEF (EF 35-40%), Hypertension, COPD, OSA Likely secondary to acute heart failure exacerbation, pulmonary edema in hypertensive emergency, and COPD exacerbation.  Overnight he had respiratory distress which required BiPAP support.  He was given duonebs, solumedrol, hydroxyzine, and lasix 20mg  IV.  This morning he is much improved and on nasal cannula satting at 99%.  He reports some mild shortness of breath and fatigue, but is otherwise not having any active symptoms.  He still has diffuse wheezing throughout. CXR this morning showed resolution of pulmonary edema. His blood pressure remains elevated despite restarting his home hydralazine medication, so we will increase it as we believe this may be contributing to his dyspnea.  Repeat VBG this morning showed resolution of hypercarbia and normal pH.  He has been stable from a respiratory standpoint today.  We will continue his home CPAP tonight. -Increase hydralazine to 50 mg every 8 hours -Prednisone 40 mg daily for 4 doses -Echocardiogram -Repeat CMP; if K+ high, give lokelma -Restart home torsemide; strict I's & O's -CPAP at bedtime ordered  Chronic Problems:  HLD/CAD: Continue aspirin, atorvastatin, Imdur, metoprolol, ranolazine, ticagrelor PAF: Metoprolol succinate 200 mg daily CKD IIIa: Will avoid nephrotoxic meds and monitor kidney function  Best Practice: Diet: Cardiac diet IVF: None VTE: enoxaparin (LOVENOX) injection 30 mg Start: 04/03/23 1800 Code: Full AB: None Therapy Recs: pending DISPO: Anticipated discharge in 1-2 days to Home pending medical  stability.   Signature: Annett Fabian, MD  Internal Medicine Resident, PGY-1 [name]  Pager: [phone] 3:14 PM, 04/03/2023   Please contact the on call pager after 5 pm and on weekends at [phone].

## 2023-04-03 NOTE — Plan of Care (Signed)

## 2023-04-04 ENCOUNTER — Other Ambulatory Visit (HOSPITAL_COMMUNITY): Payer: Self-pay

## 2023-04-04 DIAGNOSIS — I5042 Chronic combined systolic (congestive) and diastolic (congestive) heart failure: Secondary | ICD-10-CM

## 2023-04-04 DIAGNOSIS — I480 Paroxysmal atrial fibrillation: Secondary | ICD-10-CM

## 2023-04-04 DIAGNOSIS — R0603 Acute respiratory distress: Secondary | ICD-10-CM

## 2023-04-04 LAB — CBC
HCT: 32 % — ABNORMAL LOW (ref 39.0–52.0)
Hemoglobin: 10.4 g/dL — ABNORMAL LOW (ref 13.0–17.0)
MCH: 27.4 pg (ref 26.0–34.0)
MCHC: 32.5 g/dL (ref 30.0–36.0)
MCV: 84.4 fL (ref 80.0–100.0)
Platelets: 166 10*3/uL (ref 150–400)
RBC: 3.79 MIL/uL — ABNORMAL LOW (ref 4.22–5.81)
RDW: 16.3 % — ABNORMAL HIGH (ref 11.5–15.5)
WBC: 9.2 10*3/uL (ref 4.0–10.5)

## 2023-04-04 LAB — COMPREHENSIVE METABOLIC PANEL
ALT: 79 U/L — ABNORMAL HIGH (ref 0–44)
AST: 46 U/L — ABNORMAL HIGH (ref 15–41)
Albumin: 3.8 g/dL (ref 3.5–5.0)
Alkaline Phosphatase: 122 U/L (ref 38–126)
Anion gap: 12 (ref 5–15)
BUN: 38 mg/dL — ABNORMAL HIGH (ref 8–23)
CO2: 25 mmol/L (ref 22–32)
Calcium: 8.8 mg/dL — ABNORMAL LOW (ref 8.9–10.3)
Creatinine, Ser: 2 mg/dL — ABNORMAL HIGH (ref 0.61–1.24)
GFR, Estimated: 33 mL/min — ABNORMAL LOW (ref >60)
Glucose, Bld: 130 mg/dL — ABNORMAL HIGH (ref 70–99)
Sodium: 136 mmol/L (ref 135–145)
Total Bilirubin: 0.8 mg/dL (ref 0.3–1.2)

## 2023-04-04 MED ORDER — PREDNISONE 20 MG PO TABS
40.0000 mg | ORAL_TABLET | Freq: Every day | ORAL | 0 refills | Status: AC
  Filled 2023-04-04 (×2): qty 4, 2d supply, fill #0

## 2023-04-04 MED ORDER — CLOPIDOGREL BISULFATE 75 MG PO TABS: 75.0000 mg | ORAL_TABLET | Freq: Every day | ORAL | Status: DC

## 2023-04-04 MED ORDER — TIOTROPIUM BROMIDE MONOHYDRATE 18 MCG IN CAPS
1.0000 | ORAL_CAPSULE | Freq: Every day | RESPIRATORY_TRACT | 11 refills | Status: DC
  Filled 2023-04-04: qty 10.7, 30d supply, fill #0
  Filled 2023-04-04: qty 30, 30d supply, fill #0

## 2023-04-04 MED ORDER — ALBUTEROL SULFATE HFA 108 (90 BASE) MCG/ACT IN AERS
1.0000 | INHALATION_SPRAY | Freq: Four times a day (QID) | RESPIRATORY_TRACT | 1 refills | Status: DC | PRN
Start: 2023-04-04 — End: 2023-05-20
  Filled 2023-04-04: qty 6.7, 25d supply, fill #0
  Filled 2023-04-04: qty 6.7, 10d supply, fill #0

## 2023-04-04 MED ORDER — MOMETASONE FURO-FORMOTEROL FUM 200-5 MCG/ACT IN AERO
2.0000 | INHALATION_SPRAY | Freq: Two times a day (BID) | RESPIRATORY_TRACT | 6 refills | Status: DC
Start: 2023-04-04 — End: 2023-05-23
  Filled 2023-04-04 (×2): qty 13, 30d supply, fill #0

## 2023-04-04 MED ORDER — HYDRALAZINE HCL 50 MG PO TABS
50.0000 mg | ORAL_TABLET | Freq: Three times a day (TID) | ORAL | 0 refills | Status: DC
  Filled 2023-04-04 (×2): qty 90, 30d supply, fill #0

## 2023-04-04 MED ORDER — CLOPIDOGREL BISULFATE 75 MG PO TABS
ORAL_TABLET | ORAL | 0 refills | Status: DC
  Filled 2023-04-04: qty 34, 31d supply, fill #0
  Filled 2023-04-04: qty 4, 1d supply, fill #0

## 2023-04-04 MED ORDER — MOMETASONE FURO-FORMOTEROL FUM 200-5 MCG/ACT IN AERO
2.0000 | INHALATION_SPRAY | Freq: Two times a day (BID) | RESPIRATORY_TRACT | Status: DC
  Filled 2023-04-04: qty 8.8

## 2023-04-04 MED ORDER — CLOPIDOGREL BISULFATE 75 MG PO TABS
75.0000 mg | ORAL_TABLET | Freq: Every day | ORAL | 0 refills | Status: DC
  Filled 2023-04-04: qty 30, 30d supply, fill #0

## 2023-04-04 MED ORDER — IPRATROPIUM-ALBUTEROL 0.5-2.5 (3) MG/3ML IN SOLN
3.0000 mL | Freq: Four times a day (QID) | RESPIRATORY_TRACT | 0 refills | Status: DC | PRN
  Filled 2023-04-04 (×2): qty 360, 30d supply, fill #0

## 2023-04-04 MED ORDER — CLOPIDOGREL BISULFATE 75 MG PO TABS: 300.0000 mg | ORAL_TABLET | Freq: Every day | ORAL | Status: DC

## 2023-04-04 MED ORDER — TORSEMIDE 20 MG PO TABS
20.0000 mg | ORAL_TABLET | Freq: Every day | ORAL | 0 refills | Status: DC | PRN
  Filled 2023-04-04 (×2): qty 30, 30d supply, fill #0

## 2023-04-04 NOTE — Hospital Course (Addendum)
  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  His shortness of  breath increased significantly on the morning of 7/23 so he called EMS.  He received 2 DuoNebs and 125 mg Solumedrol prior to arrival emergency department. On arrival in the ED, he was quite hypertensive with a blood pressure 225/112 and a respiratory rate of 34.  No hypoxia was noted on room air. He was briefly placed on BiPAP with significant relief of his dyspnea.  EKG was obtained and was non-concerning.  Chest x-ray showed hyperinflated lungs and pulmonary edema.  BNP was elevated in the 1700s. Troponins were unremarkable.  An ABG demonstrated pCO2 41, pH 7.375.  He was treated with nebulizers, steroids, inhalers, magnesium, and diuretics.  He was transitioned to nasal cannula and was admitted to the internal medicine team for management.  On day 1 of admission, he developed worsening shortness of breath and increased work of breathing.  A VBG demonstrated acidosis with pH 7.28, pCO2 52.  He was placed on further breathing treatments and IV Lasix. His respiratory status improved and he was transitioned back to room air. Since then, has been satting well on room air and is able to ambulate without any desaturations.  He has some baseline and expiratory wheezes bilaterally, predominantly in the bases, secondary to his COPD, but he has returned to his baseline respiratory function.  Hypertensive emergency His blood pressure was 225/112 on arrival to the emergency department.  Given his pulmonary edema, it was thought that there may be an element in which the hypertension was contributing to flash pulmonary edema.  His home hydralazine was restarted and slowly titrated up to 50 mg every 8 hours.  His blood pressure returned to a normal level over the  course of 2 days.

## 2023-04-04 NOTE — Progress Notes (Signed)
Shortly after the RT put the patient on the BiPap, the patient took off the mask. He said that it didn't feel right and made him short of breath. Placed the patient on 2 L O2 nasal cannula for comfort and informed the RT he is off the machine.  Will continue to monitor.  Harriet Masson, RN

## 2023-04-04 NOTE — Plan of Care (Signed)
  Problem: Education: Goal: Knowledge of General Education information will improve Description Including pain rating scale, medication(s)/side effects and non-pharmacologic comfort measures Outcome: Progressing   Problem: Health Behavior/Discharge Planning: Goal: Ability to manage health-related needs will improve Outcome: Progressing   Problem: Clinical Measurements: Goal: Ability to maintain clinical measurements within normal limits will improve Outcome: Progressing Goal: Will remain free from infection Outcome: Progressing Goal: Diagnostic test results will improve Outcome: Progressing Goal: Respiratory complications will improve Outcome: Progressing Goal: Cardiovascular complication will be avoided Outcome: Progressing   Problem: Activity: Goal: Risk for activity intolerance will decrease Outcome: Progressing   Problem: Nutrition: Goal: Adequate nutrition will be maintained Outcome: Progressing   Problem: Coping: Goal: Level of anxiety will decrease Outcome: Progressing   Problem: Elimination: Goal: Will not experience complications related to bowel motility Outcome: Progressing Goal: Will not experience complications related to urinary retention Outcome: Progressing   Problem: Pain Managment: Goal: General experience of comfort will improve Outcome: Progressing   Problem: Safety: Goal: Ability to remain free from injury will improve Outcome: Progressing   Problem: Skin Integrity: Goal: Risk for impaired skin integrity will decrease Outcome: Progressing   Problem: Education: Goal: Knowledge of disease or condition will improve Outcome: Progressing Goal: Knowledge of the prescribed therapeutic regimen will improve Outcome: Progressing   Problem: Activity: Goal: Ability to tolerate increased activity will improve Outcome: Progressing Goal: Will verbalize the importance of balancing activity with adequate rest periods Outcome: Progressing   Problem:  Respiratory: Goal: Ability to maintain a clear airway will improve Outcome: Progressing Goal: Levels of oxygenation will improve Outcome: Progressing Goal: Ability to maintain adequate ventilation will improve Outcome: Progressing   

## 2023-04-04 NOTE — TOC Transition Note (Addendum)
Transition of Care [facility]) - CM/SW Discharge Note   Patient Details  Name: [name] MRN: [id number] Date of Birth: February 19, 1941  Transition of Care [facility]) CM/SW Contact:  Lockie Pares, RN Phone Number: 04/04/2023, 10:44 AM   Clinical Narrative:    Called patient via phone to discuss any DC concerns. Nursing walked him and he does not have any oxygen requirement. He  walks long distances, still working in [employer]. He is glad that the medications were changed, with hopeful abatement of symptoms.    No Needs identified, he will call his grandson to pick him up when DC 1145, pharmacy  messaging to state the patient has no listed insurance, He did receive a MATCH in April. Called patient to discuss. He states he has Medicaid. He has been trying to reach out for Medicare, but the wait is so long over the phone. Permission to reach out to [facility] for assistance with this.  1200 Messaging with pharmacy and [facility] leadership, RN. This patient does not have insurance and has been using [facility]. He has just been MATCH in April. Expediting of FC services message sent securely to decrease readmission.  1300 Reinstated patient for MATCH. Called [employer] and wellness to set up appt within 14 days for Financial Counseling as well as follow up hospitalization. Yolanda from the call center has sent a message to administration regarding needing appt for both. They will call patient with the  appointment.  Final next level of care: Home/Self Care Barriers to Discharge: No Barriers Identified   Patient Goals and CMS Choice      Discharge Placement                         Discharge Plan and Services Additional resources added to the After Visit Summary for                                       Social Determinants of Health (SDOH) Interventions SDOH Screenings   Food Insecurity: No Food Insecurity (04/02/2023)  Housing: Low Risk  (04/02/2023)   Transportation Needs: No Transportation Needs (04/02/2023)  Utilities: Not At Risk (04/02/2023)  Alcohol Screen: Low Risk  (12/13/2022)  Depression (PHQ2-9): Low Risk  (01/14/2023)  Recent Concern: Depression (PHQ2-9) - Medium Risk (11/05/2022)  Financial Resource Strain: High Risk (12/13/2022)  Tobacco Use: Medium Risk (04/02/2023)     Readmission Risk Interventions     No data to display

## 2023-04-04 NOTE — Consult Note (Signed)
Cardiology Consultation   Patient ID: RAINE ELSASS MRN: [id number]; DOB: 1941-08-30  Admit date: 04/02/2023 Date of Consult: 04/04/2023  PCP:  Claiborne Rigg, NP   [facility] Providers Cardiologist:  Peter Swaziland, MD  Electrophysiologist:  Regan Lemming, MD       Patient Profile:   KHIRY PASQUARIELLO is a 82 y.o. male with a hx of coronary artery disease with last stent placed in April 2024 who is being seen 04/04/2023 for the evaluation of transition from Brilinta to clopidogrel at the request of Dr Sol Blazing.  History of Present Illness:   Mr. Marcussen is a 82 year old male with a complicated cardiac history with recent cardiac catheterization in April 2024 by Dr. Peter Swaziland which resulted in drug-eluting stent placement and recommendation for indefinite Brilinta therapy lifelong .  Question was posed if we could transition him from Brilinta to clopidogrel secondary to sensation of shortness of breath perhaps being exacerbated by the Brilinta usage.  He is not currently actively having any chest pain.  Has a history of systolic and diastolic heart failure with CRT-D paroxysmal atrial fibrillation chronic kidney disease stage III.  Also has COPD as well.  Has been using BiPAP, recently discontinued.  Blood pressure has been elevated at points during this admission.   Past Medical History:  Diagnosis Date   Acute renal failure (HCC) 05/31/2014   Angina decubitus 06/15/2014   CAD (coronary artery disease)    a. s/p stent RCA DES 9/09; b. 2014 Attempted PCI of OM1 @ [place];  c. 04/2014 Cath: LAD 40-50p, D1 95-99 (chronic), LCX 30-40 inf branch, OM1 CTO, RCA 30-40p, RCA patent stent, EF 35%->Med Rx.   Chronic combined systolic and diastolic CHF (congestive heart failure) (HCC)    a. EF about 40 to 45% per echo in April 2013;  b. 04/2014 Echo: EF 20-25%, sev LVH, sev glob HK, Gr 1 DD, mildly reduced RV fxn, PASP . c. 01/2017: EF improved to 50-55%.    Chronic low  back pain    Complete heart block (HCC)    COPD (chronic obstructive pulmonary disease) (HCC) dx 06/2013   PFTs 07/08/13: mod obst with resp to bronchodilator, moderate decrease diffusion, airtrapping   COPD with acute exacerbation (HCC) 01/17/2017   COPD with asthma    07/08/13 PFT: FEV1 1.74L (66% pred, 30% change with BD), mod obst with resp to bronchodilator, moderate decrease diffusion, air-trapping 11/2013 Simple spiro>> clear obstruction, FEV1 1.30 L (47% pred) - trial of symbicort 160 2bid 01/26/15      Depression 09/15/2018   Assessment: Increased sadness and depression since losing job  Plan: Patient denies suicidal and homicidal ideations Patient would not like to start medication therapies at this time Will establish patient with [facility] for primary care   Dyslipidemia    a. on statin   Essential hypertension    HLD (hyperlipidemia)    HTN (hypertension)    a. Reports intolerance to hydralazine; b. no beta blockers 2/2 bradycardia;  c. failed on ACE and ARB.   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 04/2014 Echo: EF 20-25%, sev glob HK.   Mixed Ischemic/Non-Ischemic Cardiomyopathy    probable mixed ischemic and non-ischemic    Noncompliance    OSA (obstructive sleep apnea) 04/18/2018   06/11/16 - home sleep study  shows AHI of 2.9 an hour with the lowest SaO2 of 79% with an average of 93%  03/08/2018-Home sleep study-AHI 7/HR, SaO2 low 81%    Paroxysmal atrial fibrillation (HCC)    a. identified on device interrogation 01/2016   Pneumonia 09/02/2013   Pulmonary edema 05/10/2014   Respiratory arrest (HCC) 05/18/2014   Sinus bradycardia 05/31/2014   Ventricular tachycardia (HCC)    treated with ATP,  CL 250-300 msec    Past Surgical History:  Procedure Laterality Date   BI-VENTRICULAR IMPLANTABLE  CARDIOVERTER DEFIBRILLATOR N/A 08/12/2014   MDT Ovidio Kin XT CRTD implanted by Dr Johney Frame   BIV ICD GENERATOR CHANGEOUT N/A 08/08/2021   Procedure: BIV ICD GENERATOR CHANGEOUT;  Surgeon: Hillis Range, MD;  Location: [facility] INVASIVE CV LAB;  Service: Cardiovascular;  Laterality: N/A;   CARDIAC CATHETERIZATION     ejection fraction 50%   CORONARY STENT INTERVENTION N/A 12/10/2022   Procedure: CORONARY STENT INTERVENTION;  Surgeon: Swaziland, Peter M, MD;  Location: [facility] INVASIVE CV LAB;  Service: Cardiovascular;  Laterality: N/A;   CORONARY STENT INTERVENTION N/A 12/12/2022   Procedure: CORONARY STENT INTERVENTION;  Surgeon: Swaziland, Peter M, MD;  Location: [facility] INVASIVE CV LAB;  Service: Cardiovascular;  Laterality: N/A;   CORONARY ULTRASOUND/IVUS N/A 12/10/2022   Procedure: Intravascular Ultrasound/IVUS;  Surgeon: Swaziland, Peter M, MD;  Location: [facility] INVASIVE CV LAB;  Service: Cardiovascular;  Laterality: N/A;   CORONARY ULTRASOUND/IVUS N/A 12/12/2022   Procedure: Coronary Ultrasound/IVUS;  Surgeon: Swaziland, Peter M, MD;  Location: [facility] INVASIVE CV LAB;  Service: Cardiovascular;  Laterality: N/A;   LEFT HEART CATH AND CORONARY ANGIOGRAPHY N/A 12/10/2022   Procedure: LEFT HEART CATH AND CORONARY ANGIOGRAPHY;  Surgeon: Swaziland, Peter M, MD;  Location: [facility] INVASIVE CV LAB;  Service: Cardiovascular;  Laterality: N/A;   LEFT HEART CATHETERIZATION WITH CORONARY ANGIOGRAM N/A 05/08/2014   Procedure: LEFT HEART CATHETERIZATION WITH CORONARY ANGIOGRAM;  Surgeon: Micheline Chapman, MD;  Location: [facility] CATH LAB;  Service: Cardiovascular;  Laterality: N/A;     Home Medications:  Prior to Admission medications   Medication Sig Start Date End Date Taking? Authorizing Provider  amiodarone (PACERONE) 200 MG tablet Take 400 mg by mouth 2 (two) times daily.   Yes [provider]  aspirin EC 81 MG tablet Take 1 tablet (81 mg total) by mouth daily. Swallow whole. 12/15/22  Yes Perlie Gold, PA-C  atorvastatin (LIPITOR) 80 MG tablet  Take 1 tablet (80 mg total) by mouth once daily. 02/20/22  Yes Swaziland, Peter M, MD  famotidine (PEPCID) 40 MG tablet Take 1 tablet (40 mg total) by mouth daily. 11/05/22  Yes Claiborne Rigg, NP  hydrALAZINE (APRESOLINE) 25 MG tablet Take 1 tablet (25 mg total) by mouth 3 (three) times daily. 02/06/23 02/06/24 Yes Simmons, Brittainy M, PA-C  isosorbide mononitrate (IMDUR) 30 MG 24 hr tablet Take 0.5 tablets (15 mg total) by mouth daily. 02/06/23 02/06/24 Yes Simmons, Brittainy M, PA-C  metoprolol (TOPROL-XL) 200 MG 24 hr tablet Take 1 tablet (200 mg total) by mouth daily. Take with or immediately following a meal. 12/15/22 04/02/23 Yes Perlie Gold, PA-C  nitroGLYCERIN (NITROSTAT) 0.4 MG SL tablet Place 1 tablet (0.4 mg total) under the tongue every 5 (five) minutes as needed for chest pain. 12/08/21 04/27/24 Yes Hoy Register, MD  potassium chloride (KLOR-CON M) 10 MEQ tablet Take 1 tablet (10 mEq total) by mouth daily. 02/06/23  Yes Robbie Lis M, PA-C  ranolazine (RANEXA) 500 MG 12 hr tablet Take 1 tablet (  500 mg total) by mouth 2 (two) times daily. 12/14/22  Yes Perlie Gold, PA-C  torsemide (DEMADEX) 20 MG tablet Take 1 tablet (20 mg total) by mouth daily. Patient taking differently: Take 20 mg by mouth daily. Take daily as needed for weight gain of 3 lbs overnight or 5 lb in 1 week. 02/06/23  Yes Simmons, Brittainy M, PA-C  albuterol (PROVENTIL) (2.5 MG/3ML) 0.083% nebulizer solution Take 3 mLs (2.5 mg total) by nebulization every 6 (six) hours as needed for wheezing or shortness of breath. Patient not taking: Reported on 04/02/2023 07/11/22   Claiborne Rigg, NP  albuterol (VENTOLIN HFA) 108 (90 Base) MCG/ACT inhaler Inhale 1-2 puffs into the lungs every 6 (six) hours as needed for wheezing or shortness of breath. Patient not taking: Reported on 04/02/2023 11/13/21   Claiborne Rigg, NP  Glycopyrrolate-Formoterol (BEVESPI AEROSPHERE) 9-4.8 MCG/ACT AERO Inhale 2 puffs into the lungs 2 (two) times  daily. NEEDS PASS Patient not taking: Reported on 04/02/2023 07/12/22   Claiborne Rigg, NP  ipratropium-albuterol (DUONEB) 0.5-2.5 (3) MG/3ML SOLN Take 3 mLs by nebulization every 6 (six) hours as needed (wheezing or shortness of breath). Patient not taking: Reported on 04/02/2023 09/14/22   Zenia Resides, MD  mometasone-formoterol [facility]) 200-5 MCG/ACT AERO Inhale 2 puffs into the lungs 2 (two) times daily. Patient not taking: Reported on 04/02/2023 07/11/22   Claiborne Rigg, NP  ticagrelor (BRILINTA) 90 MG TABS tablet Take 1 tablet (90 mg total) by mouth 2 (two) times daily. Patient not taking: Reported on 04/02/2023 12/14/22 06/12/23  Perlie Gold, PA-C    Inpatient Medications: Scheduled Meds:  aspirin EC  81 mg Oral Daily   atorvastatin  80 mg Oral Daily   budesonide (PULMICORT) nebulizer solution  0.5 mg Nebulization BID   enoxaparin (LOVENOX) injection  30 mg Subcutaneous Q24H   famotidine  40 mg Oral Daily   hydrALAZINE  50 mg Oral Q8H   ipratropium-albuterol  3 mL Nebulization Q6H   isosorbide mononitrate  15 mg Oral Daily   metoprolol  200 mg Oral Daily   mometasone-formoterol  2 puff Inhalation BID   predniSONE  40 mg Oral Q breakfast   ranolazine  500 mg Oral BID   sodium zirconium cyclosilicate  5 g Oral Once   ticagrelor  90 mg Oral BID   Continuous Infusions:  PRN Meds: acetaminophen **OR** acetaminophen, albuterol, benzonatate, hydrOXYzine, menthol-cetylpyridinium, pneumococcal 20-valent conjugate vaccine, senna-docusate  Allergies:    Allergies  Allergen Reactions   Acyclovir And Related     GI upset    Aspirin Other (See Comments)    GI upset at high doses only.    Social History:   Social History   Socioeconomic History   Marital status: Married    Spouse name: Not on file   Number of children: 3   Years of education: Not on file   Highest education level: Not on file  Occupational History   Occupation: security guard.  Tobacco Use   Smoking  status: Former    Current packs/day: 0.00    Average packs/day: 0.5 packs/day for 56.0 years (28.0 ttl pk-yrs)    Types: Cigarettes    Start date: 09/10/1956    Quit date: 09/10/2012    Years since quitting: 10.5   Smokeless tobacco: Never   Tobacco comments:    tried 1 month ago (05/2018) but didn't like it   Vaping Use   Vaping status: Never Used  Substance and Sexual Activity  Alcohol use: No   Drug use: No   Sexual activity: Not Currently  Other Topics Concern   Not on file  Social History Narrative          Social Determinants of Health   Financial Resource Strain: High Risk (12/13/2022)   Overall Financial Resource Strain (CARDIA)    Difficulty of Paying Living Expenses: Hard  Food Insecurity: No Food Insecurity (04/02/2023)   Hunger Vital Sign    Worried About Running Out of Food in the Last Year: Never true    Ran Out of Food in the Last Year: Never true  Transportation Needs: No Transportation Needs (04/02/2023)   PRAPARE - Administrator, Civil Service (Medical): No    Lack of Transportation (Non-Medical): No  Physical Activity: Not on file  Stress: Not on file  Social Connections: Not on file  Intimate Partner Violence: Not At Risk (04/02/2023)   Humiliation, Afraid, Rape, and Kick questionnaire    Fear of Current or Ex-Partner: No    Emotionally Abused: No    Physically Abused: No    Sexually Abused: No    Family History:    Family History  Problem Relation Age of Onset   Heart attack Neg Hx    Stroke Neg Hx      ROS:  Please see the history of present illness.   All other ROS reviewed and negative.     Physical Exam/Data:   Vitals:   04/04/23 0430 04/04/23 0735 04/04/23 0739 04/04/23 0913  BP: (!) 157/80   (!) 151/68  Pulse: 64   64  Resp: 18   18  Temp: 98.4 F (36.9 C)   98.3 F (36.8 C)  TempSrc: Oral   Oral  SpO2: 100% 100% 99% 98%  Weight:      Height:        Intake/Output Summary (Last 24 hours) at 04/04/2023 0936 Last  data filed at 04/04/2023 0900 Gross per 24 hour  Intake 600 ml  Output 1850 ml  Net -1250 ml      04/04/2023    4:26 AM 04/03/2023    5:39 AM 04/02/2023    7:42 AM  Last 3 Weights  Weight (lbs) 139 lb 5.3 oz 145 lb 1 oz 148 lb 13 oz  Weight (kg) 63.2 kg 65.8 kg 67.5 kg     Body mass index is 21.19 kg/m.  General:  Well nourished, well developed, in no acute distress HEENT: normal Neck: no JVD Vascular: No carotid bruits; Distal pulses 2+ bilaterally Cardiac:  normal S1, S2; RRR; no murmur  Lungs: Diffuse wheezes  abd: soft, nontender, no hepatomegaly  Ext: no edema Musculoskeletal:  No deformities, BUE and BLE strength normal and equal Skin: warm and dry  Neuro:  CNs 2-12 intact, no focal abnormalities noted Psych:  Normal affect   EKG:  The EKG was personally reviewed and demonstrates: AV pacing Telemetry:  Telemetry was personally reviewed and demonstrates: AV pacing  Relevant CV Studies: Cardiac Studies & Procedures   CARDIAC CATHETERIZATION  CARDIAC CATHETERIZATION 12/12/2022  Narrative   Prox RCA lesion is 80% stenosed.   Prox RCA to Mid RCA lesion is 75% stenosed.   Ramus lesion is 40% stenosed.   1st Mrg lesion is 100% stenosed.   RPAV lesion is 100% stenosed.   Non-stenotic Prox LAD to Mid LAD lesion was previously treated.   A drug-eluting stent was successfully placed using a SYNERGY XD 3.50X38.   A drug-eluting stent was  successfully placed using a STENT ONYX FRONTIER 3.5X38.   Post intervention, there is a 0% residual stenosis.   Post intervention, there is a 0% residual stenosis.  Successful stenting of the proximal and mid RCA with overlapping 3.5 x 38 mm DES but with resultant no reflow due to poor distal vessel runoff  Plan: Brilinta monotherapy indefinitely due to ASA intolerance. Will monitor in ICU. Check troponins. IV Ntg and analgesics for pain.  Findings Coronary Findings Diagnostic  Dominance: Right  Left Anterior Descending Non-stenotic  Prox LAD to Mid LAD lesion was previously treated. The lesion is segmental. RFR: 0.84.  First Diagonal Branch Vessel is small in size.  Ramus Intermedius Ramus lesion is 40% stenosed.  Left Circumflex  First Obtuse Marginal Branch Collaterals 1st Mrg filled by collaterals from Ramus.  1st Mrg lesion is 100% stenosed. The lesion is chronically occluded.  Second Obtuse Marginal Branch  Right Coronary Artery Prox RCA lesion is 80% stenosed. Prox RCA to Mid RCA lesion is 75% stenosed. The lesion was previously treated using a drug eluting stent over 2 years ago.  Right Posterior Atrioventricular Artery Collaterals RPAV filled by collaterals from Dist Cx.  RPAV lesion is 100% stenosed.  Intervention  Prox RCA lesion Stent CATHETER LAUNCHER 6FR AL1 guide catheter was inserted. Lesion crossed with guidewire using a WIRE ASAHI PROWATER 180CM. Pre-stent angioplasty was performed using a BALLN EMERGE MR 3.0X20. A drug-eluting stent was successfully placed using a STENT ONYX FRONTIER 3.5X38. Maximum pressure: 14 atm. Stent strut is well apposed. Stent overlaps previously placed stent. Post-stent angioplasty was not performed. Post-Intervention Lesion Assessment The intervention was successful. Pre-interventional TIMI flow is 1. Post-intervention TIMI flow is 3. No-reflow occurred during the intervention. Ultrasound (IVUS) was performed on the lesion post PCI. Stent well apposed. There is a 0% residual stenosis post intervention.  Prox RCA to Mid RCA lesion Stent CATHETER LAUNCHER 6FR AL1 guide catheter was inserted. Lesion crossed with guidewire using a WIRE ASAHI PROWATER 180CM. Pre-stent angioplasty was performed using a BALLN EMERGE MR 2.5X15. A drug-eluting stent was successfully placed using a SYNERGY XD 3.50X38. Maximum pressure: 14 atm. Stent strut is well apposed. Post-stent angioplasty was not performed. Post-Intervention Lesion Assessment The intervention was successful.  Pre-interventional TIMI flow is 3. Post-intervention TIMI flow is 1. No-reflow occurred during the intervention. Ultrasound (IVUS) was performed on the lesion post PCI. Stent well apposed. There is a 0% residual stenosis post intervention.   CARDIAC CATHETERIZATION 12/10/2022  Narrative   Prox LAD to Mid LAD lesion is 70% stenosed.   Ramus lesion is 40% stenosed.   1st Mrg lesion is 100% stenosed.   Prox RCA lesion is 80% stenosed.   Prox RCA to Mid RCA lesion is 75% stenosed.   RPAV lesion is 100% stenosed.   A drug-eluting stent was successfully placed using a SYNERGY XD 3.50X38.   Post intervention, there is a 0% residual stenosis.   There is mild to moderate left ventricular systolic dysfunction.   LV end diastolic pressure is mildly elevated.   The left ventricular ejection fraction is 45-50% by visual estimate.  3 vessel obstructive CAD - 75% mid LAD segemental stenosis with RFR 0.84 - 100% CTO of the first OM with left to left collaterals. - 80% proximal RCA, 75% mid RCA in stent, 100% PLOM with left to right collaterals. 2. Mild to moderate LV dysfunction. EF estimated at 45%. 3. Mildly elevated LVEDP 4. Successful PCI of the mid LAD with IVUS guidance and  DES (3.5 x 38 Synergy post dilated to 4.0)   Plan: DAPT indefinitely. Will keep overnight. Repeat Echo. Recheck BMET in am. If renal function is stable plan staged PCI of the proximal and mid RCA tomorrow. Would treat PL and OM occlusions medically.  Findings Coronary Findings Diagnostic  Dominance: Right  Left Anterior Descending Prox LAD to Mid LAD lesion is 70% stenosed. The lesion is segmental. Pressure gradient was performed on the lesion. RFR: 0.84. Ultrasound (IVUS) was performed. Severe plaque burden was detected. IVUS has determined that the lesion is heterogeneous.  First Diagonal Branch Vessel is small in size.  Ramus Intermedius Ramus lesion is 40% stenosed.  Left Circumflex  First Obtuse Marginal  Branch Collaterals 1st Mrg filled by collaterals from Ramus.  1st Mrg lesion is 100% stenosed. The lesion is chronically occluded.  Second Obtuse Marginal Branch  Right Coronary Artery Prox RCA lesion is 80% stenosed. Prox RCA to Mid RCA lesion is 75% stenosed. The lesion was previously treated using a drug eluting stent over 2 years ago.  Right Posterior Atrioventricular Artery Collaterals RPAV filled by collaterals from Dist Cx.  RPAV lesion is 100% stenosed.  Intervention  Prox LAD to Mid LAD lesion Stent CATH [place]IST[place] GUIDE 6FR XBLAD3.5 guide catheter was inserted. Lesion crossed with guidewire using a WIRE ASAHI PROWATER 180CM. Pre-stent angioplasty was performed using a BALLN EMERGE MR 2.5X15. A drug-eluting stent was successfully placed using a SYNERGY XD 3.50X38. Stent strut is well apposed. Post-stent angioplasty was performed using a BALLN [place] EMERGE MR 4.0X20. Maximum pressure:  16 atm. Post-Intervention Lesion Assessment The intervention was successful. Pre-interventional TIMI flow is 3. Post-intervention TIMI flow is 3. No complications occurred at this lesion. Ultrasound (IVUS) was performed on the lesion post PCI. Stent well apposed. There is a 0% residual stenosis post intervention.   STRESS TESTS  MYOCARDIAL PERFUSION IMAGING 12/15/2021  Narrative   Findings are consistent with prior myocardial infarction. The study is intermediate risk.   No ST deviation was noted.   Left ventricular function is abnormal. Global function is severely reduced. End diastolic cavity size is normal.   Prior study available for comparison from 02/20/2016.  Small, moderate intensity fixed lesion in the inferolateral wall consistent with old scar. No significant change from prior study   ECHOCARDIOGRAM  ECHOCARDIOGRAM COMPLETE 04/03/2023  Narrative ECHOCARDIOGRAM REPORT    Patient Name:   BEAUDEN TREMONT [facility] Date of Exam: 04/03/2023 Medical Rec #:  [id number]      Height:       68.0  in Accession #:    [id number]     Weight:       145.1 lb Date of Birth:  11-19-1940     BSA:          1.783 m Patient Age:    81 years       BP:           174/82 mmHg Patient Gender: M              HR:           65 bpm. Exam Location:  Inpatient  Procedure: 2D Echo, Cardiac Doppler and Color Doppler  Indications:    Congestive Heart Failure I50.9  History:        Patient has prior history of Echocardiogram examinations, most recent 12/15/2022. Cardiomyopathy, LVH and CHF, Pacemaker, COPD, Arrythmias:LBBB and CHB; Risk Factors:Dyslipidemia, Hypertension and Former Smoker.  Sonographer:    Aron Baba Referring Phys: [id number] GRACE LAU  IMPRESSIONS   1. Left ventricular ejection fraction, by estimation, is 40 to 45%. The left ventricle has mildly decreased function. The left ventricle demonstrates global hypokinesis. There is severe concentric left ventricular hypertrophy. Left ventricular diastolic function could not be evaluated. There is mild hypokinesis of the left ventricular, basal-mid inferior wall and inferolateral wall. 2. Right ventricular systolic function is normal. The right ventricular size is normal. There is mildly elevated pulmonary artery systolic pressure. 3. Left atrial size was moderately dilated. 4. Right atrial size was moderately dilated. 5. The mitral valve is normal in structure. Moderate mitral valve regurgitation. No evidence of mitral stenosis. 6. The aortic valve is grossly normal. There is mild calcification of the aortic valve. Aortic valve regurgitation is trivial. Aortic valve sclerosis is present, with no evidence of aortic valve stenosis. 7. The inferior vena cava is dilated in size with >50% respiratory variability, suggesting right atrial pressure of 8 mmHg.  Comparison(s): No significant change from prior study.  FINDINGS Left Ventricle: Left ventricular ejection fraction, by estimation, is 40 to 45%. The left ventricle has mildly decreased  function. The left ventricle demonstrates global hypokinesis. Mild hypokinesis of the left ventricular, basal-mid inferior wall and inferolateral wall. The left ventricular internal cavity size was normal in size. There is severe concentric left ventricular hypertrophy. Abnormal (paradoxical) septal motion, consistent with RV pacemaker. Left ventricular diastolic function could not be evaluated due to paced rhythm. Left ventricular diastolic function could not be evaluated.  Right Ventricle: The right ventricular size is normal. Right vetricular wall thickness was not well visualized. Right ventricular systolic function is normal. There is mildly elevated pulmonary artery systolic pressure. The tricuspid regurgitant velocity is 2.99 m/s, and with an assumed right atrial pressure of 8 mmHg, the estimated right ventricular systolic pressure is 43.8 mmHg.  Left Atrium: Left atrial size was moderately dilated.  Right Atrium: Right atrial size was moderately dilated.  Pericardium: Trivial pericardial effusion is present.  Mitral Valve: The mitral valve is normal in structure. Moderate mitral valve regurgitation. No evidence of mitral valve stenosis.  Tricuspid Valve: The tricuspid valve is grossly normal. Tricuspid valve regurgitation is trivial. No evidence of tricuspid stenosis.  Aortic Valve: The aortic valve is grossly normal. There is mild calcification of the aortic valve. Aortic valve regurgitation is trivial. Aortic valve sclerosis is present, with no evidence of aortic valve stenosis.  Pulmonic Valve: The pulmonic valve was grossly normal. Pulmonic valve regurgitation is not visualized. No evidence of pulmonic stenosis.  Aorta: The aortic root and ascending aorta are structurally normal, with no evidence of dilitation.  Venous: The inferior vena cava is dilated in size with greater than 50% respiratory variability, suggesting right atrial pressure of 8 mmHg.  IAS/Shunts: The atrial  septum is grossly normal.  Additional Comments: A device lead is visualized.   LEFT VENTRICLE PLAX 2D LVIDd:         3.90 cm      Diastology LVIDs:         3.50 cm      LV e' medial:    4.80 cm/s LV PW:         1.95 cm      LV E/e' medial:  30.4 LV IVS:        2.11 cm      LV e' lateral:   3.00 cm/s LVOT diam:     1.90 cm      LV E/e' lateral: 48.7 LV SV:  46 LV SV Index:   26 LVOT Area:     2.84 cm  LV Volumes (MOD) LV vol d, MOD A2C: 147.0 ml LV vol d, MOD A4C: 122.0 ml LV vol s, MOD A2C: 76.2 ml LV vol s, MOD A4C: 78.0 ml LV SV MOD A2C:     70.8 ml LV SV MOD A4C:     122.0 ml LV SV MOD BP:      57.5 ml  RIGHT VENTRICLE RV S prime:     11.60 cm/s  LEFT ATRIUM             Index        RIGHT ATRIUM           Index LA diam:        5.10 cm 2.86 cm/m   RA Area:     18.90 cm LA Vol (A2C):   82.0 ml 45.99 ml/m  RA Volume:   60.90 ml  34.16 ml/m LA Vol (A4C):   71.1 ml 39.88 ml/m LA Biplane Vol: 75.9 ml 42.57 ml/m AORTIC VALVE LVOT Vmax:   94.30 cm/s LVOT Vmean:  53.100 cm/s LVOT VTI:    0.164 m  AORTA Ao Root diam: 3.30 cm Ao Asc diam:  3.60 cm  MITRAL VALVE                 TRICUSPID VALVE MV Area (PHT): 3.91 cm      TR Peak grad:   35.8 mmHg MV Decel Time: 194 msec      TR Vmax:        299.00 cm/s MR Peak grad:   116.2 mmHg MR Mean grad:   72.0 mmHg    SHUNTS MR Vmax:        539.00 cm/s  Systemic VTI:  0.16 m MR Vmean:       399.0 cm/s   Systemic Diam: 1.90 cm MR PISA:        0.57 cm MR PISA Radius: 0.30 cm MV E velocity: 146.00 cm/s MV A velocity: 47.10 cm/s MV E/A ratio:  3.10  Jodelle Red MD Electronically signed by Jodelle Red MD Signature Date/Time: 04/03/2023/9:53:54 PM    Final              Laboratory Data:  High Sensitivity Troponin:   Recent Labs  Lab 04/02/23 1606 04/02/23 1816  TROPONINIHS 49* 50*     Chemistry Recent Labs  Lab 04/03/23 0630 04/03/23 1408 04/03/23 2028 04/04/23 0039  NA 137 138   --  136  K 4.2 4.1  --  3.5  CL 98 102  --  99  CO2 27 26  --  25  GLUCOSE 146* 134*  --  130*  BUN 29* 32*  --  38*  CREATININE 1.96* 1.97*  --  2.00*  CALCIUM 9.1 8.9  --  8.8*  MG 2.1  --  2.1  --   GFRNONAA 34* 34*  --  33*  ANIONGAP 12 10  --  12    Recent Labs  Lab 04/03/23 0630 04/03/23 1408 04/04/23 0039  PROT 7.4 7.3 6.9  ALBUMIN 4.0 4.0 3.8  AST 71* 57* 46*  ALT 101* 90* 79*  ALKPHOS 137* 128* 122  BILITOT 0.9 0.7 0.8   Lipids No results for input(s): "CHOL", "TRIG", "HDL", "LABVLDL", "LDLCALC", "CHOLHDL" in the last 168 hours.  Hematology Recent Labs  Lab 04/02/23 0731 04/02/23 0927 04/03/23 0356 04/03/23 0630 04/04/23 0039  WBC 6.0  --   --  10.1 9.2  RBC 3.49*  --   --  3.82* 3.79*  HGB 9.8*   < > 12.2* 10.3* 10.4*  HCT 31.7*   < > 36.0* 33.4* 32.0*  MCV 90.8  --   --  87.4 84.4  MCH 28.1  --   --  27.0 27.4  MCHC 30.9  --   --  30.8 32.5  RDW 16.4*  --   --  16.4* 16.3*  PLT 140*  --   --  157 166   < > = values in this interval not displayed.   Thyroid  Recent Labs  Lab 04/02/23 0731 04/02/23 1606  TSH 6.682*  --   FREET4  --  1.27*    BNP Recent Labs  Lab 04/02/23 0731  BNP 1,708.7*    DDimer No results for input(s): "DDIMER" in the last 168 hours.   Radiology/Studies:  ECHOCARDIOGRAM COMPLETE  Result Date: 04/03/2023    ECHOCARDIOGRAM REPORT   Patient Name:   LAMARR FEENSTRA [facility] Date of Exam: 04/03/2023 Medical Rec #:  [id number]      Height:       68.0 in Accession #:    [id number]     Weight:       145.1 lb Date of Birth:  1941/06/18     BSA:          1.783 m Patient Age:    81 years       BP:           174/82 mmHg Patient Gender: M              HR:           65 bpm. Exam Location:  Inpatient Procedure: 2D Echo, Cardiac Doppler and Color Doppler Indications:    Congestive Heart Failure I50.9  History:        Patient has prior history of Echocardiogram examinations, most                 recent 12/15/2022. Cardiomyopathy, LVH and CHF,  Pacemaker, COPD,                 Arrythmias:LBBB and CHB; Risk Factors:Dyslipidemia, Hypertension                 and Former Smoker.  Sonographer:    Aron Baba Referring Phys: [id number] GRACE LAU IMPRESSIONS  1. Left ventricular ejection fraction, by estimation, is 40 to 45%. The left ventricle has mildly decreased function. The left ventricle demonstrates global hypokinesis. There is severe concentric left ventricular hypertrophy. Left ventricular diastolic  function could not be evaluated. There is mild hypokinesis of the left ventricular, basal-mid inferior wall and inferolateral wall.  2. Right ventricular systolic function is normal. The right ventricular size is normal. There is mildly elevated pulmonary artery systolic pressure.  3. Left atrial size was moderately dilated.  4. Right atrial size was moderately dilated.  5. The mitral valve is normal in structure. Moderate mitral valve regurgitation. No evidence of mitral stenosis.  6. The aortic valve is grossly normal. There is mild calcification of the aortic valve. Aortic valve regurgitation is trivial. Aortic valve sclerosis is present, with no evidence of aortic valve stenosis.  7. The inferior vena cava is dilated in size with >50% respiratory variability, suggesting right atrial pressure of 8 mmHg. Comparison(s): No significant change from prior study. FINDINGS  Left Ventricle: Left ventricular ejection fraction, by estimation, is 40 to 45%. The left ventricle has mildly decreased  function. The left ventricle demonstrates global hypokinesis. Mild hypokinesis of the left ventricular, basal-mid inferior wall and inferolateral wall. The left ventricular internal cavity size was normal in size. There is severe concentric left ventricular hypertrophy. Abnormal (paradoxical) septal motion, consistent with RV pacemaker. Left ventricular diastolic function could not be evaluated due to paced rhythm. Left ventricular diastolic function could not be evaluated.  Right Ventricle: The right ventricular size is normal. Right vetricular wall thickness was not well visualized. Right ventricular systolic function is normal. There is mildly elevated pulmonary artery systolic pressure. The tricuspid regurgitant velocity  is 2.99 m/s, and with an assumed right atrial pressure of 8 mmHg, the estimated right ventricular systolic pressure is 43.8 mmHg. Left Atrium: Left atrial size was moderately dilated. Right Atrium: Right atrial size was moderately dilated. Pericardium: Trivial pericardial effusion is present. Mitral Valve: The mitral valve is normal in structure. Moderate mitral valve regurgitation. No evidence of mitral valve stenosis. Tricuspid Valve: The tricuspid valve is grossly normal. Tricuspid valve regurgitation is trivial. No evidence of tricuspid stenosis. Aortic Valve: The aortic valve is grossly normal. There is mild calcification of the aortic valve. Aortic valve regurgitation is trivial. Aortic valve sclerosis is present, with no evidence of aortic valve stenosis. Pulmonic Valve: The pulmonic valve was grossly normal. Pulmonic valve regurgitation is not visualized. No evidence of pulmonic stenosis. Aorta: The aortic root and ascending aorta are structurally normal, with no evidence of dilitation. Venous: The inferior vena cava is dilated in size with greater than 50% respiratory variability, suggesting right atrial pressure of 8 mmHg. IAS/Shunts: The atrial septum is grossly normal. Additional Comments: A device lead is visualized.  LEFT VENTRICLE PLAX 2D LVIDd:         3.90 cm      Diastology LVIDs:         3.50 cm      LV e' medial:    4.80 cm/s LV PW:         1.95 cm      LV E/e' medial:  30.4 LV IVS:        2.11 cm      LV e' lateral:   3.00 cm/s LVOT diam:     1.90 cm      LV E/e' lateral: 48.7 LV SV:         46 LV SV Index:   26 LVOT Area:     2.84 cm  LV Volumes (MOD) LV vol d, MOD A2C: 147.0 ml LV vol d, MOD A4C: 122.0 ml LV vol s, MOD A2C: 76.2 ml LV vol s,  MOD A4C: 78.0 ml LV SV MOD A2C:     70.8 ml LV SV MOD A4C:     122.0 ml LV SV MOD BP:      57.5 ml RIGHT VENTRICLE RV S prime:     11.60 cm/s LEFT ATRIUM             Index        RIGHT ATRIUM           Index LA diam:        5.10 cm 2.86 cm/m   RA Area:     18.90 cm LA Vol (A2C):   82.0 ml 45.99 ml/m  RA Volume:   60.90 ml  34.16 ml/m LA Vol (A4C):   71.1 ml 39.88 ml/m LA Biplane Vol: 75.9 ml 42.57 ml/m  AORTIC VALVE LVOT Vmax:   94.30 cm/s LVOT Vmean:  53.100 cm/s LVOT  VTI:    0.164 m  AORTA Ao Root diam: 3.30 cm Ao Asc diam:  3.60 cm MITRAL VALVE                 TRICUSPID VALVE MV Area (PHT): 3.91 cm      TR Peak grad:   35.8 mmHg MV Decel Time: 194 msec      TR Vmax:        299.00 cm/s MR Peak grad:   116.2 mmHg MR Mean grad:   72.0 mmHg    SHUNTS MR Vmax:        539.00 cm/s  Systemic VTI:  0.16 m MR Vmean:       399.0 cm/s   Systemic Diam: 1.90 cm MR PISA:        0.57 cm MR PISA Radius: 0.30 cm MV E velocity: 146.00 cm/s MV A velocity: 47.10 cm/s MV E/A ratio:  3.10 Jodelle Red MD Electronically signed by Jodelle Red MD Signature Date/Time: 04/03/2023/9:53:54 PM    Final    DG CHEST PORT 1 VIEW  Result Date: 04/03/2023 CLINICAL DATA:  82 year old male with shortness of breath. EXAM: PORTABLE CHEST 1 VIEW COMPARISON:  Portable chest 04/02/2023 and earlier. FINDINGS: Portable AP semi upright view at 0809 hours. Larger lung volumes. Stable cardiomegaly and mediastinal contours. Stable left chest AICD. Allowing for portable technique the lungs are clear. No pneumothorax or pleural effusion. Visualized tracheal air column is within normal limits. No acute osseous abnormality identified. IMPRESSION: Stable cardiomegaly. No acute cardiopulmonary abnormality. Electronically Signed   By: Odessa Fleming M.D.   On: 04/03/2023 08:22   DG Chest Portable 1 View  Result Date: 04/02/2023 CLINICAL DATA:  Shortness of breath. EXAM: PORTABLE CHEST 1 VIEW COMPARISON:  March 30, 2023. FINDINGS: Stable  cardiomegaly. Left-sided defibrillator is unchanged in position. Mild central pulmonary vascular congestion is noted. Mildly increased bibasilar opacities are noted concerning for atelectasis or possibly infiltrates. Bony thorax is unremarkable. IMPRESSION: Mild central pulmonary vascular congestion. Mildly increased bibasilar opacities are noted concerning for atelectasis or possibly infiltrates. Electronically Signed   By: Lupita Raider M.D.   On: 04/02/2023 07:59     Assessment and Plan:   82 year old male with extensive coronary artery disease recent drug-eluting stent placement April 2024 with ticagrelor usage.  Underlying COPD.  Recent exacerbation.  Question posed if he can transition from ticagrelor to Plavix.  -I think would be reasonable to transition him at this point from ticagrelor to clopidogrel or Plavix.  My recommendation would be to administer a Plavix load of 300 mg 12 hours after his last dose of ticagrelor and then utilize Plavix 75 mg daily thereafter. Discussed with patient.  Please make sure to reiterate these instructions to him.  Paroxysmal atrial fibrillation - Low burden on device checks.  Not anticoagulated per electrophysiology.  Continue with metoprolol.  Chronic combined systolic and diastolic heart failure - EF as low as 25% in 2015 recovered to 55% in 2021 now 35 to 40% once again with RV reduced function as well.  Has had trouble in the past with amlodipine and Entresto was discontinued with CKD.  Taking torsemide as needed.  Risk Assessment/Risk Scores:      We will go ahead and sign off.  Please let us know if we can be of further assistance.     For questions or updates, please contact [facility] Please consult [email or web address] for contact info under    Signed, Donato Schultz, MD  04/04/2023 9:36 AM

## 2023-04-04 NOTE — Discharge Summary (Addendum)
Name: [name] MRN: [id number] DOB: [birth year] 82 y.o. PCP: Claiborne Rigg, NP  Date of Admission: 04/02/2023  7:19 AM Date of Discharge:  04/04/2023 Attending Physician: Dr. Dickie La  DISCHARGE DIAGNOSIS:  Primary Problem: Acute respiratory distress   Hospital Problems: Principal Problem:   Acute respiratory distress Active Problems:   COPD with acute exacerbation (HCC)   Acute exacerbation of CHF (congestive heart failure) (HCC)   Hypertensive emergency   Acute hypoxic respiratory failure (HCC)   COPD exacerbation (HCC)    DISCHARGE MEDICATIONS:   Allergies as of 04/04/2023       Reactions   Acyclovir And Related    GI upset    Aspirin Other (See Comments)   GI upset at high doses only.        Medication List     STOP taking these medications    amiodarone 200 MG tablet Commonly known as: PACERONE   Bevespi Aerosphere 9-4.8 MCG/ACT Aero Generic drug: Glycopyrrolate-Formoterol Replaced by: tiotropium 18 MCG inhalation capsule   Brilinta 90 MG Tabs tablet Generic drug: ticagrelor       TAKE these medications    albuterol (2.5 MG/3ML) 0.083% nebulizer solution Commonly known as: PROVENTIL Take 3 mLs (2.5 mg total) by nebulization every 6 (six) hours as needed for wheezing or shortness of breath.   albuterol 108 (90 Base) MCG/ACT inhaler Commonly known as: VENTOLIN HFA Inhale 1-2 puffs into the lungs every 6 (six) hours as needed for wheezing or shortness of breath.   aspirin EC 81 MG tablet Take 1 tablet (81 mg total) by mouth daily. Swallow whole.   atorvastatin 80 MG tablet Commonly known as: LIPITOR Take 1 tablet (80 mg total) by mouth once daily.   clopidogrel 75 MG tablet Commonly known as: PLAVIX Take 4 tablets (300 mg total) by mouth tonight, 7/25 at around 9pm for 1 day, THEN 1 tablet (75 mg total) daily starting 7/26 Start taking on: April 04, 2023   famotidine 40 MG tablet Commonly known as: Pepcid Take 1 tablet (40 mg  total) by mouth daily.   hydrALAZINE 50 MG tablet Commonly known as: APRESOLINE Take 1 tablet (50 mg total) by mouth 3 (three) times daily. What changed:  medication strength how much to take   ipratropium-albuterol 0.5-2.5 (3) MG/3ML Soln Commonly known as: DUONEB Take 3 mLs by nebulization every 6 (six) hours as needed (wheezing or shortness of breath).   isosorbide mononitrate 30 MG 24 hr tablet Commonly known as: IMDUR Take 0.5 tablets (15 mg total) by mouth daily.   metoprolol 200 MG 24 hr tablet Commonly known as: TOPROL-XL Take 1 tablet (200 mg total) by mouth daily. Take with or immediately following a meal.   mometasone-formoterol 200-5 MCG/ACT Aero Commonly known as: DULERA Inhale 2 puffs into the lungs 2 (two) times daily.   nitroGLYCERIN 0.4 MG SL tablet Commonly known as: Nitrostat Place 1 tablet (0.4 mg total) under the tongue every 5 (five) minutes as needed for chest pain.   potassium chloride 10 MEQ tablet Commonly known as: KLOR-CON M Take 1 tablet (10 mEq total) by mouth daily.   predniSONE 20 MG tablet Commonly known as: DELTASONE Take 2 tablets (40 mg total) by mouth daily with breakfast for 2 days. Start taking on: April 05, 2023   ranolazine 500 MG 12 hr tablet Commonly known as: Ranexa Take 1 tablet (500 mg total) by mouth 2 (two) times daily.   tiotropium 18 MCG inhalation capsule Commonly known  as: SPIRIVA Place 1 capsule (18 mcg total) into inhaler and inhale daily. Replaces: Bevespi Aerosphere 9-4.8 MCG/ACT Aero   torsemide 20 MG tablet Commonly known as: DEMADEX Take 1 tablet (20 mg total) by mouth daily as needed for weight gain of 3 lbs overnight or 5 lbs in 1 week. What changed:  when to take this reasons to take this        DISPOSITION AND FOLLOW-UP:  [name] was discharged from [facility] in Stable condition. At the hospital follow up visit please address:  Acute respiratory failure: Assess his  respiratory status.  Ensure he finished his course of prednisone. Ensure he is using his COPD inhalers appropriately.  Hypertension: Blood pressure 225/112 on admission, improved to 135/62 on discharge.  Increased hydralazine to 50 mg 3 times a day.  Recommend rechecking his blood pressure and modifying his medications as appropriate.  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  Ensure that he is taking metoprolol, imdur, ranolazine, plavix, rosuvastatin, aspirin, and torsemide prn as prescribed.  It seems that his amiodarone was held at some point and he has not been taking it recently but that needs to be clarified. Ensure follow-up with cardiology. He has appointments on 8/13 at [facility], 8/30 at 7am at [facility], and on 9/17 at 1:45 PM with Dr. Elberta Fortis at [facility].  Follow-up Recommendations: Consults: Cardiology Labs: BMP Studies: None Medications: Stopped ticagrelor, switched to Plavix. Plavix 300 mg tonight, 7/25, around 9 PM.  Then starting 7/26, take 75 mg every day.   Prednisone 40 mg daily with breakfast for 2 days.   Hydralazine (Apresoline) increased to 50 mg 3 times a day.    Torsemide 20 mg daily as needed.  Stopped amiodarone-unclear patient has been taking it-until follow-up with cardiology. No recent fill history and last cardiology notes this was discontinued.  Follow-up Appointments:  Follow-up Information     [facility]. Go on 04/23/2023.   Specialty: Cardiology Why: They will remind you of your appointment time on 8/15 for hospital follow up Contact information: [address] [place] [address] [phone]        Claiborne Rigg, NP. Schedule an appointment as soon as possible for a visit on 04/11/2023.   Specialty: Nurse Practitioner Why: * NEED TO SPEAK TO FINACIAL  COUNSELLOR* set up for Medicare/ Medicaid. They will call you to schedule that and for the 14 days post hospital. 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  His shortness of  breath increased significantly on the morning of 7/23 so he called EMS.  He received 2 DuoNebs and 125 mg Solumedrol prior to arrival emergency department. On arrival in the ED, he was quite hypertensive with a blood pressure 225/112 and a respiratory rate of 34.  No hypoxia was noted on room air. He was briefly placed on BiPAP with significant relief of his dyspnea.  EKG was obtained and was non-concerning.  Chest x-ray showed hyperinflated lungs and pulmonary edema.  BNP was elevated in the 1700s. Troponins were unremarkable.  An ABG demonstrated pCO2 41, pH 7.375.  He was treated with nebulizers, steroids, inhalers, magnesium, and diuretics.  He was transitioned to nasal cannula and was admitted to the internal medicine team for management.  On day 1 of admission, he developed worsening shortness of breath and increased work of breathing.  A VBG demonstrated acidosis with pH 7.28, pCO2 52.  He was placed on further breathing treatments and IV Lasix. His respiratory status improved and he was transitioned back to room air. Since then, has been satting well on room air and is able to ambulate without any desaturations.  He has some baseline and expiratory wheezes bilaterally, predominantly in the bases, secondary to his COPD, but he has returned to his baseline respiratory function.  Hypertensive emergency His blood pressure was 225/112 on arrival to the emergency department.  Given his pulmonary edema,  it was thought that there may be an element in which the hypertension was contributing to flash pulmonary edema.  His home hydralazine was restarted and slowly titrated up to 50 mg every 8 hours.  His blood pressure returned to a normal level over the course of 2 days.    DISCHARGE INSTRUCTIONS:   Discharge Instructions     Call MD for:  difficulty breathing, headache or visual disturbances   Complete by: As directed    Call MD for:  extreme fatigue   Complete by: As directed    Call MD for:  persistant dizziness or light-headedness   Complete by: As directed    Call MD for:  persistant nausea and vomiting   Complete by: As directed    Diet - low sodium heart healthy   Complete by: As directed    Discharge instructions   Complete by: As directed    It was a pleasure taking care of you at [facility].  We are glad you are feeling better.  You were admitted and managed for acute respiratory failure likely caused by a COPD and heart failure exacerbation.  The following medication changes have been made:  Please take 1 tablet (300 mg) of Plavix tonight around 9 PM.  Then tomorrow, please start taking 1 tablet (75 mg) Plavix every day.  Please take 2 tablets (40 mg total) prednisone daily with breakfast for 2 days.  We have increased your hydralazine (Apresoline) to 50 mg 3 times a day for blood pressure control.  Please take your water pill, torsemide (Demadex), as needed when you notice swelling or weight gain.  Please STOP taking amiodarone and ticagrelor until you follow-up with your cardiologist.  You may continue taking the rest of your medications as prescribed.  We have given you refills of all of your inhalers.  It is important that you use these regularly to avoid another COPD exacerbation.  Please call and make a hospital follow-up appointment with your PCP, Bertram Denver, NP, in the next week or two.  You have a follow-up appointment scheduled with your  cardiologist on August 13 at 3:35 PM at [facility].   Increase activity slowly   Complete by: As directed        SUBJECTIVE:   He was evaluated at bedside this morning.  He was doing well on room air saturations greater than 95.  He was not complaining of any significant shortness of breath.  His wheezes were improved from yesterday.   Discharge Vitals:   BP 135/62 (  BP Location: Right Arm)   Pulse (!) 59   Temp 97.6 F (36.4 C) (Oral)   Resp 18   Ht 5\' 8"  (1.727 m)   Wt 63.2 kg   SpO2 96%   BMI 21.19 kg/m   OBJECTIVE:  Physical Exam Constitutional:      Appearance: Normal appearance.  HENT:     Mouth/Throat:     Mouth: Mucous membranes are moist.  Cardiovascular:     Rate and Rhythm: Normal rate and regular rhythm.     Pulses: Normal pulses.     Heart sounds: Normal heart sounds.  Pulmonary:     Breath sounds: Wheezing present.     Comments: End expiratory wheezes bilaterally, more predominant in the bases. Neurological:     Mental Status: He is alert and oriented to person, place, and time.     Pertinent Labs, Studies, and Procedures:     Latest Ref Rng & Units 04/04/2023   12:39 AM 04/03/2023    6:30 AM 04/03/2023    3:56 AM  CBC  WBC 4.0 - 10.5 K/uL 9.2  10.1    Hemoglobin 13.0 - 17.0 g/dL 21.3  08.6  57.8   Hematocrit 39.0 - 52.0 % 32.0  33.4  36.0   Platelets 150 - 400 K/uL 166  157         Latest Ref Rng & Units 04/04/2023   12:39 AM 04/03/2023    2:08 PM 04/03/2023    6:30 AM  CMP  Glucose 70 - 99 mg/dL 469  629  528   BUN 8 - 23 mg/dL 38  32  29   Creatinine 0.61 - 1.24 mg/dL 4.13  2.44  0.10   Sodium 135 - 145 mmol/L 136  138  137   Potassium 3.5 - 5.1 mmol/L 3.5  4.1  4.2   Chloride 98 - 111 mmol/L 99  102  98   CO2 22 - 32 mmol/L 25  26  27    Calcium 8.9 - 10.3 mg/dL 8.8  8.9  9.1   Total Protein 6.5 - 8.1 g/dL 6.9  7.3  7.4   Total Bilirubin 0.3 - 1.2 mg/dL 0.8  0.7  0.9   Alkaline Phos 38 - 126 U/L 122  128   137   AST 15 - 41 U/L 46  57  71   ALT 0 - 44 U/L 79  90  101    Hepatitis panel: Nonreactive  TSH: 6.62, T4: 1.27  Respiratory panel: Negative  Imaging:  ECHOCARDIOGRAM COMPLETE Result Date: 04/03/2023 IMPRESSIONS  1. Left ventricular ejection fraction, by estimation, is 40 to 45%. The left ventricle has mildly decreased function. The left ventricle demonstrates global hypokinesis. There is severe concentric left ventricular hypertrophy. Left ventricular diastolic  function could not be evaluated. There is mild hypokinesis of the left ventricular, basal-mid inferior wall and inferolateral wall.  2. Right ventricular systolic function is normal. The right ventricular size is normal. There is mildly elevated pulmonary artery systolic pressure.  3. Left atrial size was moderately dilated.  4. Right atrial size was moderately dilated.  5. The mitral valve is normal in structure. Moderate mitral valve regurgitation. No evidence of mitral stenosis.  6. The aortic valve is grossly normal. There is mild calcification of the aortic valve. Aortic valve regurgitation is trivial. Aortic valve sclerosis is present, with no evidence of aortic valve stenosis.  7. The inferior vena cava is dilated in size with >50% respiratory variability, suggesting right  atrial pressure of 8 mmHg. Comparison(s): No significant change from prior study.    Signed: Annett Fabian, MD Internal Medicine Resident, PGY-1 [name]  Pager: [phone] 1:49 PM, 04/04/2023

## 2023-04-04 NOTE — Progress Notes (Signed)
SATURATION QUALIFICATIONS: (This note is used to comply with regulatory documentation for home oxygen)  Patient Saturations on Room Air at Rest = 98%  Patient Saturations on Room Air while Ambulating = 95%  Patient Saturations on 0 Liters of oxygen while Ambulating = 98%

## 2023-04-08 ENCOUNTER — Telehealth: Payer: Self-pay

## 2023-04-08 NOTE — Transitions of Care (Post Inpatient/ED Visit) (Signed)
   04/08/2023  Name: JERIMI WRITE MRN: [id number] DOB: [birth year]  Today's TOC FU Call Status: Today's TOC FU Call Status:: Unsuccessul Call (1st Attempt) Unsuccessful Call (1st Attempt) Date: 04/08/23  Attempted to reach the patient regarding the most recent Inpatient/ED visit.  Follow Up Plan: Additional outreach attempts will be made to reach the patient to complete the Transitions of Care (Post Inpatient/ED visit) call.   Signature Robyne Peers, RN

## 2023-04-09 ENCOUNTER — Telehealth: Payer: Self-pay

## 2023-04-09 NOTE — Transitions of Care (Post Inpatient/ED Visit) (Signed)
   04/09/2023  Name: [name] MRN: [id number] DOB: 1940-12-20  Today's TOC FU Call Status: Today's TOC FU Call Status:: Unsuccessful Call (2nd Attempt) Unsuccessful Call (1st Attempt) Date: 04/08/23 Unsuccessful Call (2nd Attempt) Date: 04/09/23  Attempted to reach the patient regarding the most recent Inpatient/ED visit.  Follow Up Plan: Additional outreach attempts will be made to reach the patient to complete the Transitions of Care (Post Inpatient/ED visit) call.   Signature Robyne Peers, RN

## 2023-04-10 ENCOUNTER — Telehealth: Payer: Self-pay

## 2023-04-10 NOTE — Transitions of Care (Post Inpatient/ED Visit) (Signed)
   04/10/2023  Name: IDA VENKATARAMAN MRN: [id number] DOB: 06/10/1941  Today's TOC FU Call Status: Today's TOC FU Call Status:: Unsuccessful Call (3rd Attempt) Unsuccessful Call (1st Attempt) Date: 04/08/23 Unsuccessful Call (2nd Attempt) Date: 04/09/23 Unsuccessful Call (3rd Attempt) Date: 04/10/23  Attempted to reach the patient regarding the most recent Inpatient/ED visit.  Follow Up Plan: No further outreach attempts will be made at this time. We have been unable to contact the patient.  Letter sent to patient requesting he call [facility] to schedule an appointment as we have not been able to reach him.   Signature Robyne Peers, RN

## 2023-04-18 ENCOUNTER — Observation Stay (HOSPITAL_COMMUNITY)
Admission: EM | Admit: 2023-04-18 | Discharge: 2023-04-19 | Disposition: A | Discharge status: Home / Self Care | Payer: Self-pay | Attending: Internal Medicine | Admitting: Internal Medicine

## 2023-04-18 ENCOUNTER — Other Ambulatory Visit: Payer: Self-pay

## 2023-04-18 ENCOUNTER — Emergency Department (HOSPITAL_COMMUNITY): Payer: Self-pay

## 2023-04-18 DIAGNOSIS — I2510 Atherosclerotic heart disease of native coronary artery without angina pectoris: Secondary | ICD-10-CM | POA: Insufficient documentation

## 2023-04-18 DIAGNOSIS — Z955 Presence of coronary angioplasty implant and graft: Secondary | ICD-10-CM | POA: Insufficient documentation

## 2023-04-18 DIAGNOSIS — D649 Anemia, unspecified: Secondary | ICD-10-CM | POA: Insufficient documentation

## 2023-04-18 DIAGNOSIS — Z950 Presence of cardiac pacemaker: Secondary | ICD-10-CM | POA: Insufficient documentation

## 2023-04-18 DIAGNOSIS — Z7982 Long term (current) use of aspirin: Secondary | ICD-10-CM | POA: Insufficient documentation

## 2023-04-18 DIAGNOSIS — K219 Gastro-esophageal reflux disease without esophagitis: Secondary | ICD-10-CM

## 2023-04-18 DIAGNOSIS — J9601 Acute respiratory failure with hypoxia: Secondary | ICD-10-CM

## 2023-04-18 DIAGNOSIS — Z1152 Encounter for screening for COVID-19: Secondary | ICD-10-CM | POA: Insufficient documentation

## 2023-04-18 DIAGNOSIS — I5042 Chronic combined systolic (congestive) and diastolic (congestive) heart failure: Principal | ICD-10-CM | POA: Insufficient documentation

## 2023-04-18 DIAGNOSIS — Z79899 Other long term (current) drug therapy: Secondary | ICD-10-CM | POA: Insufficient documentation

## 2023-04-18 DIAGNOSIS — F17210 Nicotine dependence, cigarettes, uncomplicated: Secondary | ICD-10-CM | POA: Insufficient documentation

## 2023-04-18 DIAGNOSIS — R0602 Shortness of breath: Secondary | ICD-10-CM

## 2023-04-18 DIAGNOSIS — J9621 Acute and chronic respiratory failure with hypoxia: Secondary | ICD-10-CM | POA: Insufficient documentation

## 2023-04-18 DIAGNOSIS — R7989 Other specified abnormal findings of blood chemistry: Secondary | ICD-10-CM | POA: Insufficient documentation

## 2023-04-18 DIAGNOSIS — Z87891 Personal history of nicotine dependence: Secondary | ICD-10-CM

## 2023-04-18 DIAGNOSIS — J441 Chronic obstructive pulmonary disease with (acute) exacerbation: Principal | ICD-10-CM | POA: Insufficient documentation

## 2023-04-18 DIAGNOSIS — I480 Paroxysmal atrial fibrillation: Secondary | ICD-10-CM | POA: Insufficient documentation

## 2023-04-18 DIAGNOSIS — I509 Heart failure, unspecified: Secondary | ICD-10-CM

## 2023-04-18 DIAGNOSIS — I5030 Unspecified diastolic (congestive) heart failure: Secondary | ICD-10-CM

## 2023-04-18 LAB — CBC WITH DIFFERENTIAL/PLATELET
Abs Immature Granulocytes: 0.01 10*3/uL (ref 0.00–0.07)
Basophils Absolute: 0 10*3/uL (ref 0.0–0.1)
Basophils Relative: 0 %
Eosinophils Absolute: 0.1 10*3/uL (ref 0.0–0.5)
Eosinophils Relative: 1 %
HCT: 31.1 % — ABNORMAL LOW (ref 39.0–52.0)
Hemoglobin: 9.5 g/dL — ABNORMAL LOW (ref 13.0–17.0)
Immature Granulocytes: 0 %
Lymphocytes Relative: 28 %
Lymphs Abs: 1.9 10*3/uL (ref 0.7–4.0)
MCH: 27.5 pg (ref 26.0–34.0)
MCHC: 30.5 g/dL (ref 30.0–36.0)
MCV: 89.9 fL (ref 80.0–100.0)
Monocytes Absolute: 0.6 10*3/uL (ref 0.1–1.0)
Monocytes Relative: 10 %
Neutro Abs: 4 10*3/uL (ref 1.7–7.7)
Neutrophils Relative %: 61 %
Platelets: 154 10*3/uL (ref 150–400)
RBC: 3.46 MIL/uL — ABNORMAL LOW (ref 4.22–5.81)
RDW: 16.3 % — ABNORMAL HIGH (ref 11.5–15.5)
WBC: 6.6 10*3/uL (ref 4.0–10.5)
nRBC: 0 % (ref 0.0–0.2)

## 2023-04-18 LAB — BASIC METABOLIC PANEL
Anion gap: 12 (ref 5–15)
BUN: 13 mg/dL (ref 8–23)
CO2: 23 mmol/L (ref 22–32)
Calcium: 8.5 mg/dL — ABNORMAL LOW (ref 8.9–10.3)
Chloride: 109 mmol/L (ref 98–111)
Creatinine, Ser: 1.16 mg/dL (ref 0.61–1.24)
GFR, Estimated: >60 mL/min (ref >60)
Glucose, Bld: 98 mg/dL (ref 70–99)
Potassium: 3.6 mmol/L (ref 3.5–5.1)
Sodium: 144 mmol/L (ref 135–145)

## 2023-04-18 LAB — TROPONIN I (HIGH SENSITIVITY)
Troponin I (High Sensitivity): 79 ng/L — ABNORMAL HIGH (ref <18)
Troponin I (High Sensitivity): 65 ng/L — ABNORMAL HIGH (ref <18)

## 2023-04-18 LAB — I-STAT VENOUS BLOOD GAS, ED
Acid-Base Excess: 0 mmol/L (ref 0.0–2.0)
Bicarbonate: 24 mmol/L (ref 20.0–28.0)
Calcium, Ion: 1.07 mmol/L — ABNORMAL LOW (ref 1.15–1.40)
HCT: 30 % — ABNORMAL LOW (ref 39.0–52.0)
Hemoglobin: 10.2 g/dL — ABNORMAL LOW (ref 13.0–17.0)
O2 Saturation: 91 %
Potassium: 3.6 mmol/L (ref 3.5–5.1)
Sodium: 146 mmol/L — ABNORMAL HIGH (ref 135–145)
TCO2: 25 mmol/L (ref 22–32)
pCO2, Ven: 37.1 mmHg — ABNORMAL LOW (ref 44–60)
pH, Ven: 7.419 (ref 7.25–7.43)
pO2, Ven: 60 mmHg — ABNORMAL HIGH (ref 32–45)

## 2023-04-18 LAB — BRAIN NATRIURETIC PEPTIDE: B Natriuretic Peptide: 1618.2 pg/mL — ABNORMAL HIGH (ref 0.0–100.0)

## 2023-04-18 LAB — SARS CORONAVIRUS 2 BY RT PCR: SARS Coronavirus 2 by RT PCR: NEGATIVE

## 2023-04-18 MED ORDER — PREDNISONE 20 MG PO TABS
40.0000 mg | ORAL_TABLET | Freq: Every day | ORAL | Status: DC
  Administered 2023-04-19: 40 mg via ORAL
  Filled 2023-04-18: qty 2

## 2023-04-18 MED ORDER — FUROSEMIDE 10 MG/ML IJ SOLN
60.0000 mg | Freq: Once | INTRAMUSCULAR | Status: AC
  Administered 2023-04-18: 60 mg via INTRAVENOUS
  Filled 2023-04-18: qty 6

## 2023-04-18 MED ORDER — METOPROLOL SUCCINATE ER 100 MG PO TB24
200.0000 mg | ORAL_TABLET | Freq: Every day | ORAL | Status: DC
  Administered 2023-04-18 – 2023-04-19 (×2): 200 mg via ORAL
  Filled 2023-04-18 (×2): qty 2

## 2023-04-18 MED ORDER — HYDRALAZINE HCL 20 MG/ML IJ SOLN
10.0000 mg | Freq: Once | INTRAMUSCULAR | Status: AC
  Administered 2023-04-18: 10 mg via INTRAVENOUS
  Filled 2023-04-18: qty 1

## 2023-04-18 MED ORDER — HYDRALAZINE HCL 25 MG PO TABS
25.0000 mg | ORAL_TABLET | Freq: Three times a day (TID) | ORAL | Status: DC
  Administered 2023-04-18 (×2): 25 mg via ORAL
  Filled 2023-04-18 (×2): qty 1

## 2023-04-18 MED ORDER — ISOSORBIDE MONONITRATE ER 30 MG PO TB24
15.0000 mg | ORAL_TABLET | Freq: Every day | ORAL | Status: DC
  Administered 2023-04-18 – 2023-04-19 (×2): 15 mg via ORAL
  Filled 2023-04-18 (×2): qty 1

## 2023-04-18 MED ORDER — AZITHROMYCIN 250 MG PO TABS
500.0000 mg | ORAL_TABLET | Freq: Every day | ORAL | Status: DC
  Administered 2023-04-18 – 2023-04-19 (×2): 500 mg via ORAL
  Filled 2023-04-18 (×2): qty 2

## 2023-04-18 MED ORDER — IPRATROPIUM BROMIDE 0.02 % IN SOLN
2.5000 mL | Freq: Four times a day (QID) | RESPIRATORY_TRACT | Status: AC
  Administered 2023-04-18 (×2): 0.5 mg via RESPIRATORY_TRACT
  Filled 2023-04-18 (×4): qty 2.5

## 2023-04-18 MED ORDER — CLOPIDOGREL BISULFATE 75 MG PO TABS
75.0000 mg | ORAL_TABLET | Freq: Every day | ORAL | Status: DC
  Administered 2023-04-18 – 2023-04-19 (×2): 75 mg via ORAL
  Filled 2023-04-18 (×2): qty 1

## 2023-04-18 MED ORDER — ATORVASTATIN CALCIUM 80 MG PO TABS
80.0000 mg | ORAL_TABLET | Freq: Every day | ORAL | Status: DC
  Administered 2023-04-18 – 2023-04-19 (×2): 80 mg via ORAL
  Filled 2023-04-18 (×2): qty 1

## 2023-04-18 MED ORDER — MAGNESIUM SULFATE 2 GM/50ML IV SOLN
2.0000 g | Freq: Once | INTRAVENOUS | Status: AC
  Administered 2023-04-18: 2 g via INTRAVENOUS
  Filled 2023-04-18: qty 50

## 2023-04-18 MED ORDER — ALBUTEROL SULFATE (2.5 MG/3ML) 0.083% IN NEBU
10.0000 mg/h | INHALATION_SOLUTION | RESPIRATORY_TRACT | Status: AC
  Administered 2023-04-18: 10 mg/h via RESPIRATORY_TRACT
  Filled 2023-04-18: qty 12

## 2023-04-18 MED ORDER — RIVAROXABAN 10 MG PO TABS
10.0000 mg | ORAL_TABLET | Freq: Every day | ORAL | Status: DC
  Administered 2023-04-18 – 2023-04-19 (×2): 10 mg via ORAL
  Filled 2023-04-18 (×2): qty 1

## 2023-04-18 NOTE — H&P (Signed)
Date: 04/18/2023               Patient Name:  [name] MRN: [id number]  DOB: 28-Jul-1941 Age / Sex: 82 y.o., male   PCP: Claiborne Rigg, NP         Medical Service: Internal Medicine Teaching Service         Attending Physician: Dr. Gust Rung, DO      First Contact: Dr. Laretta Bolster, MD Pager [phone]    Second Contact: Dr. 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 He states that when he was getting ready for work this morning he felt extremely short of breath so he has his grandson call EMS for him.  In the last few days he has had some congestion and increased productive cough. He denies chest pain and nausea.  He was recently admitted 2 weeks ago for heart failure exacerbation.  ED Course: He received IV steroids and duonebs. He was placed on 4L [place] with stable oxygen saturations. Initially ED wanted to place BiPAP but patient endorsed severe anxiety with this.  Meds:  He is not taking medication regularly. The only medication he could remember was amlodipine which has not been dispensed since 02/24.  Albuterol Asa 81 mg Clopidogrel 75 mg-recently dispensed Famotidine 40 mg Hydralazine 50 mg TID IMDUR 15 mg Metoprolol 200 mg Ranolazine 500 mg BID Torsemide 20 mg PRN  Past Medical History  Past Surgical History:  Procedure Laterality Date   BI-VENTRICULAR IMPLANTABLE CARDIOVERTER DEFIBRILLATOR N/A 08/12/2014   MDT Ovidio Kin XT CRTD implanted by Dr Johney Frame   BIV ICD GENERATOR CHANGEOUT N/A 08/08/2021   Procedure: BIV ICD GENERATOR CHANGEOUT;  Surgeon: Hillis Range, MD;  Location:  [facility] INVASIVE CV LAB;  Service: Cardiovascular;  Laterality: N/A;   CARDIAC CATHETERIZATION     ejection fraction 50%   CORONARY STENT INTERVENTION N/A 12/10/2022   Procedure: CORONARY STENT INTERVENTION;  Surgeon: Swaziland, Peter M, MD;  Location: [facility] INVASIVE CV LAB;  Service: Cardiovascular;  Laterality: N/A;   CORONARY STENT INTERVENTION N/A 12/12/2022   Procedure: CORONARY STENT INTERVENTION;  Surgeon: Swaziland, Peter M, MD;  Location: [facility] INVASIVE CV LAB;  Service: Cardiovascular;  Laterality: N/A;   CORONARY ULTRASOUND/IVUS N/A 12/10/2022   Procedure: Intravascular Ultrasound/IVUS;  Surgeon: Swaziland, Peter M, MD;  Location: [facility] INVASIVE CV LAB;  Service: Cardiovascular;  Laterality: N/A;   CORONARY ULTRASOUND/IVUS N/A 12/12/2022   Procedure: Coronary Ultrasound/IVUS;  Surgeon: Swaziland, Peter M, MD;  Location: [facility] INVASIVE CV LAB;  Service: Cardiovascular;  Laterality: N/A;   LEFT HEART CATH AND CORONARY ANGIOGRAPHY N/A 12/10/2022   Procedure: LEFT HEART CATH AND CORONARY ANGIOGRAPHY;  Surgeon: Swaziland, Peter M, MD;  Location: [facility] INVASIVE CV LAB;  Service: Cardiovascular;  Laterality: N/A;   LEFT HEART CATHETERIZATION WITH CORONARY ANGIOGRAM N/A 05/08/2014   Procedure: LEFT HEART CATHETERIZATION WITH CORONARY ANGIOGRAM;  Surgeon: Micheline Chapman, MD;  Location: [facility] CATH LAB;  Service: Cardiovascular;  Laterality: N/A;    Social:  Lives With: grandson Occupation: gas station Level of Function: iADLS PCP:  Bertram Denver Substances: smokes cigarettes, denies EtOH or other substances   Allergies: Allergies as of 04/18/2023 - Review Complete 04/18/2023  Allergen Reaction Noted   Acyclovir and related Other (See Comments) 08/12/2014   Aspirin Other (See Comments) 12/13/2011    Review of Systems: A complete ROS was negative except as per HPI.   OBJECTIVE:   Physical Exam: Blood pressure (!) 165/97, pulse 82, temperature 97.9 F (36.6 C), temperature source Oral, resp. rate (!) 27, SpO2 98%.   Constitutional: in no acute distress Cardiovascular: regular rate and rhythm, no m/r/g Pulmonary/Chest: normal work of breathing on room air, lungs clear to auscultation bilaterally Abdominal: soft, non-tender, non-distended MSK: normal bulk and tone Neurological: alert & oriented x 3, 5/5 strength in bilateral upper and lower extremities, normal gait Skin: warm and dry Psych: anxious  Labs: CBC    Component Value Date/Time   WBC 6.6 04/18/2023 0728   RBC 3.46 (L) 04/18/2023 0728   HGB 10.2 (L) 04/18/2023 0801   HGB 10.5 (L) 01/14/2023 1615   HCT 30.0 (L) 04/18/2023 0801   HCT 33.7 (L) 01/14/2023 1615   PLT 154 04/18/2023 0728   PLT 183 01/14/2023 1615   MCV 89.9 04/18/2023 0728   MCV 89 01/14/2023 1615   MCH 27.5 04/18/2023 0728   MCHC 30.5 04/18/2023 0728   RDW 16.3 (H) 04/18/2023 0728   RDW 13.6 01/14/2023 1615   LYMPHSABS 1.9 04/18/2023 0728   LYMPHSABS 2.5 01/14/2023 1615   MONOABS 0.6 04/18/2023 0728   EOSABS 0.1 04/18/2023 0728   EOSABS 0.0 01/14/2023 1615   BASOSABS 0.0 04/18/2023 0728   BASOSABS 0.0 01/14/2023 1615     CMP     Component Value Date/Time   NA 146 (H) 04/18/2023 0801   NA 147 (H) 03/18/2023 0000   K 3.6 04/18/2023 0801   CL 109 04/18/2023 0728   CO2 23 04/18/2023 0728   GLUCOSE 98 04/18/2023 0728   BUN 13 04/18/2023 0728   BUN 17 03/18/2023 0000   CREATININE 1.16 04/18/2023 0728   CREATININE 0.83 04/17/2016 0330   CALCIUM 8.5 (L) 04/18/2023 0728   PROT 6.9 04/04/2023 0039   PROT 7.2 01/14/2023 1615   ALBUMIN 3.8 04/04/2023 0039   ALBUMIN 4.2 01/14/2023 1615   AST 46 (H) 04/04/2023 0039   ALT 79 (H) 04/04/2023 0039   ALKPHOS 122 04/04/2023 0039   BILITOT 0.8 04/04/2023 0039   BILITOT 0.4 01/14/2023 1615   GFRNONAA >60 04/18/2023 0728   GFRNONAA 88 04/17/2016 0330   GFRAA 90 01/12/2020 1539   GFRAA >89 04/17/2016 0330    Imaging: CXR IMPRESSION: Cardiomegaly without acute superimposed cardiopulmonary process.  EKG:  personally reviewed my interpretation is sinus rhythm that is paced with changes to ST morphology at II, III, and avf  ASSESSMENT & PLAN:   Assessment & Plan by Problem: Principal Problem:   Acute exacerbation of CHF (congestive heart failure) (HCC)   [name] is a 82 y.o. 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  BNP elevated in 1,600. He does not take torsemide regularly and only takes when he starts  feeling bad.  In the past, GDMT was discontinued due to renal function. His creatinine is now at 1.16 with GFR >60. I think it would be reasonable to restart GDMT gradually while he is admitted. No signs of low output HF. -IV lasix 60 mg BID -restart metoprolol -strict I and os -daily weights  COPD exacerbation He reports increased shortness of breath with sputum production in last 4 days. He has mild wheezing present on exam. Covid negative. He continues to smoke which could trigger exacerbation. -prednisone 40 mg for 5 days -azithromycin 500 mg for 3 days -ipratropium nebs q 6 hrs -maintain O2 saturation >88%  CAD s/p PCI Elevated troponin PCI placed 4/24. He should be on plavix and asa long term. I do not think he has been taking at home. Of note his EKG did have some changes to morphology of II, III, and Avf. Troponin mildly elevated then flat and he denies chest pain. -restart ASA and plavix -restart atorvastatin 80mg   Paroxysmal atrial fibrillation He follows with EP and is not anticoagulated as low burden of afib.  Normocytic anemia Hgb 9.5  Diet: Heart Healthy VTE:  DOAC Code: Full  Prior to Admission Living Arrangement: Home, living grandson Anticipated Discharge Location: Home Barriers to Discharge: respiratory failure  Dispo: Admit patient to Observation with expected length of stay less than 2 midnights.  Signed: Rudene Christians, DO Internal Medicine Resident PGY-3  04/18/2023, 6:13 PM

## 2023-04-18 NOTE — Plan of Care (Signed)

## 2023-04-18 NOTE — ED Triage Notes (Signed)
Pt arrives to ED c/o SOB x 1 day. Pt w/ hx of COPD/ decreased lung sounds bilaterally per EMS. Pt given 5mg  of Albuterol, 0.5mg  atrovent, 125mg  of solumedrol per EMS

## 2023-04-18 NOTE — ED Triage Notes (Signed)
Pt BIB [facility] for SOB/distress.  Pt is talking but def has work of breathing.  EMS endorses severely decreased lung sounds in left lower lobe.    EMS gave 5mg  Alb, 0.5 ATrovent, 125 solumedrol

## 2023-04-18 NOTE — ED Provider Notes (Signed)
[place] EMERGENCY DEPARTMENT AT [facility] Provider Note   CSN: [id number] Arrival date & time: 04/18/23  0703     History  Chief Complaint  Patient presents with   Shortness of Breath    [name] is a 82 y.o. male with a history of congestive heart failure, pacemaker placement, coronary disease status post MI and stenting, COPD on 2 L as needed at night, presenting from home with acute onset shortness of breath.  Patient reports he got out of the shower this morning and feels breathing was extremely labored.  EMS gave the patient 1 DuoNeb en route to the hospital as well as 125 mg of IV Solu-Medrol.  The patient does feel better after DuoNeb and continues to feel winded.  Per medical record review the patient had been hospitalized approximately 2 weeks ago, at that time with significant hypertension, respiratory distress.  X-rays at time showed pulmonary edema and his BNP was elevated and the patient was treated with medications for both COPD exacerbation as well as diuresis for heart failure.  There was some concern with his significant hypertension and may be some component of pulmonary edema as well.  He was titrated on his home hydralazine.  HPI     Home Medications Prior to Admission medications   Medication Sig Start Date End Date Taking? Authorizing Provider  albuterol (VENTOLIN HFA) 108 (90 Base) MCG/ACT inhaler Inhale 1-2 puffs into the lungs every 6 (six) hours as needed for wheezing or shortness of breath. 04/04/23  Yes Annett Fabian, MD  AMLODIPINE BESYLATE PO Take 1 tablet by mouth daily.   Yes [provider]  atorvastatin (LIPITOR) 80 MG tablet Take 1 tablet (80 mg total) by mouth once daily. 02/20/22  Yes [place], Peter M, MD  clopidogrel (PLAVIX) 75 MG tablet Take 4 tablets (300 mg total) by mouth tonight, 7/25 at around 9pm for 1 day, THEN 1 tablet (75 mg total) daily starting 7/26 04/04/23 05/05/23 Yes Annett Fabian, MD  famotidine (PEPCID)  40 MG tablet Take 1 tablet (40 mg total) by mouth daily. 11/05/22  Yes Claiborne Rigg, NP  hydrALAZINE (APRESOLINE) 50 MG tablet Take 1 tablet (50 mg total) by mouth 3 (three) times daily. 04/04/23  Yes Annett Fabian, MD  ipratropium-albuterol (DUONEB) 0.5-2.5 (3) MG/3ML SOLN Take 3 mLs by nebulization every 6 (six) hours as needed (wheezing or shortness of breath). 04/04/23  Yes Annett Fabian, MD  isosorbide mononitrate (IMDUR) 30 MG 24 hr tablet Take 0.5 tablets (15 mg total) by mouth daily. 02/06/23 02/06/24 Yes Simmons, Brittainy M, PA-C  metoprolol (TOPROL-XL) 200 MG 24 hr tablet Take 1 tablet (200 mg total) by mouth daily. Take with or immediately following a meal. 12/15/22 04/18/23 Yes Perlie Gold, PA-C  mometasone-formoterol (DULERA) 200-5 MCG/ACT AERO Inhale 2 puffs into the lungs 2 (two) times daily. 04/04/23  Yes Annett Fabian, MD  potassium chloride (KLOR-CON M) 10 MEQ tablet Take 1 tablet (10 mEq total) by mouth daily. 02/06/23  Yes Sharol Harness, Brittainy M, PA-C  tiotropium (SPIRIVA) 18 MCG inhalation capsule Place 1 capsule (18 mcg total) into inhaler and inhale daily. 04/04/23  Yes Annett Fabian, MD  torsemide (DEMADEX) 20 MG tablet Take 1 tablet (20 mg total) by mouth daily as needed for weight gain of 3 lbs overnight or 5 lbs in 1 week. 04/04/23  Yes Annett Fabian, MD  ranolazine (RANEXA) 500 MG 12 hr tablet Take 1 tablet (500 mg total) by mouth 2 (two) times daily.  12/14/22   Perlie Gold, PA-C      Allergies    Acyclovir and related and Aspirin    Review of Systems   Review of Systems  Physical Exam Updated Vital Signs BP (!) 181/96   Pulse 87   Temp 98.4 F (36.9 C) (Oral)   Resp (!) 25   SpO2 100%  Physical Exam Constitutional:      General: He is not in acute distress. HENT:     Head: Normocephalic and atraumatic.  Eyes:     Conjunctiva/sclera: Conjunctivae normal.     Pupils: Pupils are equal, round, and reactive to light.  Cardiovascular:     Rate and Rhythm:  Normal rate and regular rhythm.  Pulmonary:     Effort: Pulmonary effort is normal. No respiratory distress.     Comments: Speaking full sentences, 98% on room air, mildly tachypneic, poor air movement/distant breath sounds bilaterally Abdominal:     General: There is no distension.     Tenderness: There is no abdominal tenderness.  Skin:    General: Skin is warm and dry.  Neurological:     General: No focal deficit present.     Mental Status: He is alert. Mental status is at baseline.  Psychiatric:        Mood and Affect: Mood normal.        Behavior: Behavior normal.     ED Results / Procedures / Treatments   Labs (all labs ordered are listed, but only abnormal results are displayed) Labs Reviewed  BASIC METABOLIC PANEL - Abnormal; Notable for the following components:      Result Value   Calcium 8.5 (*)    All other components within normal limits  CBC WITH DIFFERENTIAL/PLATELET - Abnormal; Notable for the following components:   RBC 3.46 (*)    Hemoglobin 9.5 (*)    HCT 31.1 (*)    RDW 16.3 (*)    All other components within normal limits  BRAIN NATRIURETIC PEPTIDE - Abnormal; Notable for the following components:   B Natriuretic Peptide 1,618.2 (*)    All other components within normal limits  I-STAT VENOUS BLOOD GAS, ED - Abnormal; Notable for the following components:   pCO2, Ven 37.1 (*)    pO2, Ven 60 (*)    Sodium 146 (*)    Calcium, Ion 1.07 (*)    HCT 30.0 (*)    Hemoglobin 10.2 (*)    All other components within normal limits  TROPONIN I (HIGH SENSITIVITY) - Abnormal; Notable for the following components:   Troponin I (High Sensitivity) 79 (*)    All other components within normal limits  TROPONIN I (HIGH SENSITIVITY) - Abnormal; Notable for the following components:   Troponin I (High Sensitivity) 65 (*)    All other components within normal limits  SARS CORONAVIRUS 2 BY RT PCR    EKG EKG Interpretation Date/Time:  Thursday April 18 2023 07:12:29  EDT Ventricular Rate:  89 PR Interval:  180 QRS Duration:  145 QT Interval:  426 QTC Calculation: 519 R Axis:   -83  Text Interpretation: V paced rhythm, baseline wander in lateral leads, no evident ischemic changes Paired ventricular premature complexes IVCD, consider atypical RBBB LVH with IVCD, LAD and secondary repol abnrm Confirmed by Alvester Chou [phone]) on 04/18/2023 7:32:34 AM  Radiology DG Chest Portable 1 View  Result Date: 04/18/2023 CLINICAL DATA:  shortness of breath EXAM: PORTABLE CHEST 1 VIEW COMPARISON:  Chest XR, 04/03/2023 and 04/02/2023. CT chest,  12/06/2022. FINDINGS: Support lines: LEFT subclavian-approach AICD with intact leads. Overlying cutaneous leads. Cardiac silhouette enlargement. Aortic atherosclerosis. Mild hypoinflation with similar appearance of patchy RIGHT basilar opacities. No discrete consolidation. No pleural effusion or pneumothorax. No acute osseous abnormality. IMPRESSION: Cardiomegaly without acute superimposed cardiopulmonary process. Electronically Signed   By: Roanna Banning M.D.   On: 04/18/2023 07:44    Procedures .Critical Care  Performed by: Terald Sleeper, MD Authorized by: Terald Sleeper, MD   Critical care provider statement:    Critical care time (minutes):  40   Critical care time was exclusive of:  Separately billable procedures and treating other patients   Critical care was necessary to treat or prevent imminent or life-threatening deterioration of the following conditions:  Circulatory failure   Critical care was time spent personally by me on the following activities:  Ordering and performing treatments and interventions, ordering and review of laboratory studies, ordering and review of radiographic studies, pulse oximetry, review of old charts, examination of patient and evaluation of patient's response to treatment Comments:     IV diuresis for congestive heart failure, oxygen monitoring supplemental O2 for hypoxia, continuous  nebulizer treatment and breathing reassessment for COPD     Medications Ordered in ED Medications  albuterol (PROVENTIL) (2.5 MG/3ML) 0.083% nebulizer solution (0 mg/hr Nebulization Stopped 04/18/23 0836)  rivaroxaban (XARELTO) tablet 10 mg (10 mg Oral Given 04/18/23 1129)  predniSONE (DELTASONE) tablet 40 mg (has no administration in time range)  azithromycin (ZITHROMAX) tablet 500 mg (500 mg Oral Given 04/18/23 1129)  furosemide (LASIX) injection 60 mg (has no administration in time range)  ipratropium (ATROVENT) nebulizer solution 0.5 mg (0.5 mg Inhalation Given 04/18/23 1129)  hydrALAZINE (APRESOLINE) tablet 25 mg (25 mg Oral Given 04/18/23 1130)  magnesium sulfate IVPB 2 g 50 mL (0 g Intravenous Stopped 04/18/23 0855)  hydrALAZINE (APRESOLINE) injection 10 mg (10 mg Intravenous Given 04/18/23 0746)  furosemide (LASIX) injection 60 mg (60 mg Intravenous Given 04/18/23 0902)  hydrALAZINE (APRESOLINE) injection 10 mg (10 mg Intravenous Given 04/18/23 0901)    ED Course/ Medical Decision Making/ A&P Clinical Course as of 04/18/23 1231  Thu Apr 18, 2023  0813 More labored breathing and audible wheezing now, we'll start on bipap, RN calling RT [MT]  [phone] Patient was refusing to wear BiPAP.  I reassessed him he is on 4 L nasal cannula.  He continues to have some labored breathing with a respiratory rate around 30, but he says subjectively feels better.  He says he will not wear BiPAP as he feels that it is suffocating him.  I offered him an anxiolytic but he is still refusing the BiPAP.  He understands that if his breathing were to worsen, BiPAP would be the only option short of intubation. [MT]  [phone] He is completing his magnesium we will add some IV diuretic as I suspect again there may be a COPD mixed component to this.  Additional hydralazine ordered as his blood pressure remains elevated [MT]  0913 Admitted to medicine service. [MT]  1230 Deltra troponins flat [MT]    Clinical Course User Index [MT]  , Kermit Balo, MD                                 Medical Decision Making Amount and/or Complexity of Data Reviewed Labs: ordered. Radiology: ordered.  Risk Prescription drug management. Decision regarding hospitalization.   This patient presents to  the ED with concern for shortness of breath. This involves an extensive number of treatment options, and is a complaint that carries with it a high risk of complications and morbidity.  The differential diagnosis includes COPD vs CHF vs PNA vs viral URI vs anemia vs other  Co-morbidities that complicate the patient evaluation: hx of CHF and COPD at risk of exacerbations  Additional history obtained from EMS  External records from outside source obtained and reviewed including hospital discharge summary July 2024,  I ordered and personally interpreted labs.  The pertinent results include: VBG with no significant CO2 retention or acidosis.  BMP appears to be at baseline.  Troponin is chronically mildly elevated now at 79.  The patient has no leukocytosis.  Hemoglobin of chronic anemia, slightly lower at 9.5 today.  COVID and BNP testing are pending at the time of admission  I ordered imaging studies including dg chest I independently visualized and interpreted imaging which showed cardiomegaly with no focal pulmonary findings I agree with the radiologist interpretation  The patient was maintained on a cardiac monitor.  I personally viewed and interpreted the cardiac monitored which showed an underlying rhythm of: V paced rhythm   Per my interpretation the patient's ECG shows V paced rhythm, no acute ischemic changes  I ordered medication including IV mag, continuous nebs, hydralazine for COPD and hypertension; IV Lasix for diuresis  I have reviewed the patients home medicines and have made adjustments as needed  Test Considered: lower suspicion for acute PE in this clinical setting; no indication for CTA chest at this time  After  the interventions noted above, I reevaluated the patient and found that they have: stayed the same   Dispostion:  After consideration of the diagnostic results and the patients response to treatment, I feel that the patent would benefit from medical admission         Final Clinical Impression(s) / ED Diagnoses Final diagnoses:  COPD exacerbation (HCC)  Shortness of breath  Congestive heart failure, unspecified HF chronicity, unspecified heart failure type [facility])    Rx / DC Orders ED Discharge Orders     None         , Kermit Balo, MD 04/18/23 1231

## 2023-04-18 NOTE — ED Notes (Signed)
ED TO INPATIENT HANDOFF REPORT  ED Nurse Name and Phone #: [phone]  S Name/Age/Gender [name] 82 y.o. male Room/Bed: 008C/008C  Code Status   Code Status: Full Code  Home/SNF/Other Home Patient oriented to: self, place, time, and situation Is this baseline? Yes   Triage Complete: Triage complete  Chief Complaint Acute exacerbation of CHF (congestive heart failure) (HCC) [I50.9]  Triage Note Pt arrives to ED c/o SOB x 1 day. Pt w/ hx of COPD/ decreased lung sounds bilaterally per EMS. Pt given 5mg  of Albuterol, 0.5mg  atrovent, 125mg  of solumedrol per EMS  Pt BIB [facility] for SOB/distress.  Pt is talking but def has work of breathing.  EMS endorses severely decreased lung sounds in left lower lobe.    EMS gave 5mg  Alb, 0.5 ATrovent, 125 solumedrol   Allergies Allergies  Allergen Reactions   Acyclovir And Related Other (See Comments)    Pt states he is not familiar with this medication.   Aspirin Other (See Comments)    GI upset at high doses only.    Level of Care/Admitting Diagnosis ED Disposition     ED Disposition  Admit   Condition  --   Comment  Hospital Area: [facility] [[id number]]  Level of Care: Progressive [102]  Admit to Progressive based on following criteria: RESPIRATORY PROBLEMS hypoxemic/hypercapnic respiratory failure that is responsive to NIPPV (BiPAP) or High Flow Nasal Cannula (6-80 lpm). Frequent assessment/intervention, no > Q2 hrs < Q4 hrs, to maintain oxygenation and pulmonary hygiene.  May place patient in observation at [facility] or [facility] if equivalent level of care is available:: No  Covid Evaluation: Symptomatic Person Under Investigation (PUI) or recent exposure (last 10 days) *Testing Required*  Diagnosis: Acute exacerbation of CHF (congestive heart failure) (HCC) [960454]  Admitting Physician: Gust Rung [[id number]]  Attending Physician: Gust Rung [[id number]]          B Medical/Surgery History Past  Medical History:  Diagnosis Date   Acute renal failure (HCC) 05/31/2014   Angina decubitus 06/15/2014   CAD (coronary artery disease)    a. s/p stent RCA DES 9/09; b. 2014 Attempted PCI of OM1 @ [place];  c. 04/2014 Cath: LAD 40-50p, D1 95-99 (chronic), LCX 30-40 inf branch, OM1 CTO, RCA 30-40p, RCA patent stent, EF 35%->Med Rx.   Chronic combined systolic and diastolic CHF (congestive heart failure) (HCC)    a. EF about 40 to 45% per echo in April 2013;  b. 04/2014 Echo: EF 20-25%, sev LVH, sev glob HK, Gr 1 DD, mildly reduced RV fxn, PASP . c. 01/2017: EF improved to 50-55%.    Chronic low back pain    Complete heart block (HCC)    COPD (chronic obstructive pulmonary disease) (HCC) dx 06/2013   PFTs 07/08/13: mod obst with resp to bronchodilator, moderate decrease diffusion, airtrapping   COPD with acute exacerbation (HCC) 01/17/2017   COPD with asthma    07/08/13 PFT: FEV1 1.74L (66% pred, 30% change with BD), mod obst with resp to bronchodilator, moderate decrease diffusion, air-trapping 11/2013 Simple spiro>> clear obstruction, FEV1 1.30 L (47% pred) - trial of symbicort 160 2bid 01/26/15      Depression 09/15/2018   Assessment: Increased sadness and depression since losing job  Plan: Patient denies suicidal and homicidal ideations Patient would not like to start medication therapies at this time Will establish patient with [facility] for primary care   Dyslipidemia    a. on statin  Essential hypertension    HLD (hyperlipidemia)    HTN (hypertension)    a. Reports intolerance to hydralazine; b. no beta blockers 2/2 bradycardia;  c. failed on ACE and ARB.   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 04/2014 Echo: EF 20-25%, sev glob HK.   Mixed Ischemic/Non-Ischemic Cardiomyopathy    probable mixed ischemic and  non-ischemic    Noncompliance    OSA (obstructive sleep apnea) 04/18/2018   06/11/16 - home sleep study shows AHI of 2.9 an hour with the lowest SaO2 of 79% with an average of 93%  03/08/2018-Home sleep study-AHI 7/HR, SaO2 low 81%    Paroxysmal atrial fibrillation (HCC)    a. identified on device interrogation 01/2016   Pneumonia 09/02/2013   Pulmonary edema 05/10/2014   Respiratory arrest (HCC) 05/18/2014   Sinus bradycardia 05/31/2014   Ventricular tachycardia (HCC)    treated with ATP,  CL 250-300 msec   Past Surgical History:  Procedure Laterality Date   BI-VENTRICULAR IMPLANTABLE CARDIOVERTER DEFIBRILLATOR N/A 08/12/2014   MDT Ovidio Kin XT CRTD implanted by Dr Johney Frame   BIV ICD GENERATOR CHANGEOUT N/A 08/08/2021   Procedure: BIV ICD GENERATOR CHANGEOUT;  Surgeon: Hillis Range, MD;  Location: [facility] INVASIVE CV LAB;  Service: Cardiovascular;  Laterality: N/A;   CARDIAC CATHETERIZATION     ejection fraction 50%   CORONARY STENT INTERVENTION N/A 12/10/2022   Procedure: CORONARY STENT INTERVENTION;  Surgeon: Swaziland, Peter M, MD;  Location: [facility] INVASIVE CV LAB;  Service: Cardiovascular;  Laterality: N/A;   CORONARY STENT INTERVENTION N/A 12/12/2022   Procedure: CORONARY STENT INTERVENTION;  Surgeon: Swaziland, Peter M, MD;  Location: [facility] INVASIVE CV LAB;  Service: Cardiovascular;  Laterality: N/A;   CORONARY ULTRASOUND/IVUS N/A 12/10/2022   Procedure: Intravascular Ultrasound/IVUS;  Surgeon: Swaziland, Peter M, MD;  Location: [facility] INVASIVE CV LAB;  Service: Cardiovascular;  Laterality: N/A;   CORONARY ULTRASOUND/IVUS N/A 12/12/2022   Procedure: Coronary Ultrasound/IVUS;  Surgeon: Swaziland, Peter M, MD;  Location: [facility] INVASIVE CV LAB;  Service: Cardiovascular;  Laterality: N/A;   LEFT HEART CATH AND CORONARY ANGIOGRAPHY N/A 12/10/2022   Procedure: LEFT HEART CATH AND CORONARY ANGIOGRAPHY;  Surgeon: Swaziland, Peter M, MD;  Location: [facility] INVASIVE CV LAB;  Service: Cardiovascular;  Laterality: N/A;   LEFT HEART  CATHETERIZATION WITH CORONARY ANGIOGRAM N/A 05/08/2014   Procedure: LEFT HEART CATHETERIZATION WITH CORONARY ANGIOGRAM;  Surgeon: Micheline Chapman, MD;  Location: [facility] CATH LAB;  Service: Cardiovascular;  Laterality: N/A;     A IV Location/Drains/Wounds Patient Lines/Drains/Airways Status     Active Line/Drains/Airways     Name Placement date Placement time Site Days   Peripheral IV 04/18/23 18 G 1" Anterior;Right Forearm 04/18/23  0711  Forearm  less than 1   Peripheral IV 04/18/23 18 G 1" Anterior;Left;Proximal Forearm 04/18/23  0718  Forearm  less than 1            Intake/Output Last 24 hours  Intake/Output Summary (Last 24 hours) at 04/18/2023 1331 Last data filed at 04/18/2023 1021 Gross per 24 hour  Intake --  Output 700 ml  Net -700 ml    Labs/Imaging Results for orders placed or performed during the hospital encounter of 04/18/23 (from the past 48 hour(s))  Basic metabolic panel     Status: Abnormal   Collection Time: 04/18/23  7:28 AM  Result  Value Ref Range   Sodium 144 135 - 145 mmol/L   Potassium 3.6 3.5 - 5.1 mmol/L   Chloride 109 98 - 111 mmol/L   CO2 23 22 - 32 mmol/L   Glucose, Bld 98 70 - 99 mg/dL    Comment: Glucose reference range applies only to samples taken after fasting for at least 8 hours.   BUN 13 8 - 23 mg/dL   Creatinine, Ser 1.32 0.61 - 1.24 mg/dL   Calcium 8.5 (L) 8.9 - 10.3 mg/dL   GFR, Estimated >44 >01 mL/min    Comment: (NOTE) Calculated using the CKD-EPI Creatinine Equation (2021)    Anion gap 12 5 - 15    Comment: Performed at [facility] Lab, [address] [place], [place] [address]  CBC with Differential     Status: Abnormal   Collection Time: 04/18/23  7:28 AM  Result Value Ref Range   WBC 6.6 4.0 - 10.5 K/uL   RBC 3.46 (L) 4.22 - 5.81 MIL/uL   Hemoglobin 9.5 (L) 13.0 - 17.0 g/dL   HCT 36.6 (L) 44.0 - 34.7 %   MCV 89.9 80.0 - 100.0 fL   MCH 27.5 26.0 - 34.0 pg   MCHC 30.5 30.0 - 36.0 g/dL   RDW 42.5 (H) 95.6 - 38.7 %    Platelets 154 150 - 400 K/uL   nRBC 0.0 0.0 - 0.2 %   Neutrophils Relative % 61 %   Neutro Abs 4.0 1.7 - 7.7 K/uL   Lymphocytes Relative 28 %   Lymphs Abs 1.9 0.7 - 4.0 K/uL   Monocytes Relative 10 %   Monocytes Absolute 0.6 0.1 - 1.0 K/uL   Eosinophils Relative 1 %   Eosinophils Absolute 0.1 0.0 - 0.5 K/uL   Basophils Relative 0 %   Basophils Absolute 0.0 0.0 - 0.1 K/uL   Immature Granulocytes 0 %   Abs Immature Granulocytes 0.01 0.00 - 0.07 K/uL    Comment: Performed at [facility], [address], [place], [place] [address]  Troponin I (High Sensitivity)     Status: Abnormal   Collection Time: 04/18/23  7:28 AM  Result Value Ref Range   Troponin I (High Sensitivity) 79 (H) <18 ng/L    Comment: (NOTE) Elevated high sensitivity troponin I (hsTnI) values and significant  changes across serial measurements may suggest ACS but many other  chronic and acute conditions are known to elevate hsTnI results.  Refer to the "Links" section for chest pain algorithms and additional  guidance. Performed at [facility], [address], [place], [place] [address]   SARS Coronavirus 2 by RT PCR (hospital order, performed in [facility] hospital lab) *cepheid single result test* Anterior Nasal Swab     Status: None   Collection Time: 04/18/23  7:28 AM   Specimen: Anterior Nasal Swab  Result Value Ref Range   SARS Coronavirus 2 by RT PCR NEGATIVE NEGATIVE    Comment: Performed at [facility], [address], [place], [place] [address]  Brain natriuretic peptide     Status: Abnormal   Collection Time: 04/18/23  8:00 AM  Result Value Ref Range   B Natriuretic Peptide 1,618.2 (H) 0.0 - 100.0 pg/mL    Comment: Performed at [facility], [address] [place], [place] [address]  I-Stat venous blood gas, [facility] ED, [facility], [facility])     Status: Abnormal   Collection Time: 04/18/23  8:01 AM  Result Value  Ref Range   pH, Ven 7.419 7.25 - 7.43   pCO2, Ven 37.1 (L) 44 - 60 mmHg    pO2, Ven 60 (H) 32 - 45 mmHg   Bicarbonate 24.0 20.0 - 28.0 mmol/L   TCO2 25 22 - 32 mmol/L   O2 Saturation 91 %   Acid-Base Excess 0.0 0.0 - 2.0 mmol/L   Sodium 146 (H) 135 - 145 mmol/L   Potassium 3.6 3.5 - 5.1 mmol/L   Calcium, Ion 1.07 (L) 1.15 - 1.40 mmol/L   HCT 30.0 (L) 39.0 - 52.0 %   Hemoglobin 10.2 (L) 13.0 - 17.0 g/dL   Sample type VENOUS   Troponin I (High Sensitivity)     Status: Abnormal   Collection Time: 04/18/23 10:20 AM  Result Value Ref Range   Troponin I (High Sensitivity) 65 (H) <18 ng/L    Comment: (NOTE) Elevated high sensitivity troponin I (hsTnI) values and significant  changes across serial measurements may suggest ACS but many other  chronic and acute conditions are known to elevate hsTnI results.  Refer to the "Links" section for chest pain algorithms and additional  guidance. Performed at [facility], [address], [place], [place] [address]    DG Chest Portable 1 View  Result Date: 04/18/2023 CLINICAL DATA:  shortness of breath EXAM: PORTABLE CHEST 1 VIEW COMPARISON:  Chest XR, 04/03/2023 and 04/02/2023. CT chest, 12/06/2022. FINDINGS: Support lines: LEFT subclavian-approach AICD with intact leads. Overlying cutaneous leads. Cardiac silhouette enlargement. Aortic atherosclerosis. Mild hypoinflation with similar appearance of patchy RIGHT basilar opacities. No discrete consolidation. No pleural effusion or pneumothorax. No acute osseous abnormality. IMPRESSION: Cardiomegaly without acute superimposed cardiopulmonary process. Electronically Signed   By: Roanna Banning M.D.   On: 04/18/2023 07:44    Pending Labs Unresulted Labs (From admission, onward)     Start     Ordered   04/19/23 0500  Basic metabolic panel  Tomorrow morning,   R        04/18/23 1023   04/19/23 0500  CBC  Tomorrow morning,   R        04/18/23 1023   04/19/23 0500  Ferritin  Tomorrow morning,   R        04/18/23 1253   04/19/23 0500  Iron and TIBC  Tomorrow morning,   R         04/18/23 1253            Vitals/Pain Today's Vitals   04/18/23 0915 04/18/23 0930 04/18/23 1216 04/18/23 1231  BP: (!) 175/76 (!) 181/96  (!) 157/85  Pulse: 93 87  87  Resp: 19 (!) 25  (!) 22  Temp:   98.4 F (36.9 C)   TempSrc:   Oral   SpO2: 99% 100%  100%    Isolation Precautions No active isolations  Medications Medications  albuterol (PROVENTIL) (2.5 MG/3ML) 0.083% nebulizer solution (0 mg/hr Nebulization Stopped 04/18/23 0836)  rivaroxaban (XARELTO) tablet 10 mg (10 mg Oral Given 04/18/23 1129)  predniSONE (DELTASONE) tablet 40 mg (has no administration in time range)  azithromycin (ZITHROMAX) tablet 500 mg (500 mg Oral Given 04/18/23 1129)  furosemide (LASIX) injection 60 mg (has no administration in time range)  ipratropium (ATROVENT) nebulizer solution 0.5 mg (0.5 mg Inhalation Given 04/18/23 1129)  hydrALAZINE (APRESOLINE) tablet 25 mg (25 mg Oral Given 04/18/23 1130)  magnesium sulfate IVPB 2 g 50 mL (0 g Intravenous Stopped 04/18/23 0855)  hydrALAZINE (APRESOLINE) injection 10 mg (10 mg Intravenous  Given 04/18/23 0746)  furosemide (LASIX) injection 60 mg (60 mg Intravenous Given 04/18/23 0902)  hydrALAZINE (APRESOLINE) injection 10 mg (10 mg Intravenous Given 04/18/23 0901)    Mobility walks with device     Focused Assessments Pulmonary Assessment Handoff:  Lung sounds: L Breath Sounds: Diminished R Breath Sounds: Diminished O2 Device: Room Air      R Recommendations: See Admitting Provider Note  Report given to:   Additional Notes:

## 2023-04-18 NOTE — Progress Notes (Signed)
Arrived to [facility] room 3E 16 from Emergency Department. Placed on cardiac monitor and vital signs obtained.

## 2023-04-18 NOTE — Progress Notes (Signed)
Pt refused BiPAP. RT and RN attempted to explain to pt the importance of BiPAP but pt states he would rather stay on the [place].

## 2023-04-19 ENCOUNTER — Other Ambulatory Visit (HOSPITAL_COMMUNITY): Payer: Self-pay

## 2023-04-19 ENCOUNTER — Encounter (HOSPITAL_COMMUNITY): Payer: Self-pay | Admitting: Internal Medicine

## 2023-04-19 DIAGNOSIS — I509 Heart failure, unspecified: Secondary | ICD-10-CM

## 2023-04-19 MED ORDER — FAMOTIDINE 40 MG PO TABS
40.0000 mg | ORAL_TABLET | Freq: Every day | ORAL | 3 refills | Status: DC
Start: 2023-04-19 — End: 2023-05-23
  Filled 2023-04-19: qty 30, 30d supply, fill #0

## 2023-04-19 MED ORDER — PREDNISONE 20 MG PO TABS
20.0000 mg | ORAL_TABLET | Freq: Every day | ORAL | 0 refills | Status: AC
Start: 2023-04-20 — End: 2023-04-23
  Filled 2023-04-19: qty 3, 3d supply, fill #0

## 2023-04-19 MED ORDER — IPRATROPIUM-ALBUTEROL 0.5-2.5 (3) MG/3ML IN SOLN
3.0000 mL | Freq: Four times a day (QID) | RESPIRATORY_TRACT | 1 refills | Status: DC | PRN
Start: 2023-04-19 — End: 2023-07-26
  Filled 2023-04-19: qty 180, 15d supply, fill #0

## 2023-04-19 MED ORDER — FUROSEMIDE 10 MG/ML IJ SOLN
60.0000 mg | Freq: Once | INTRAMUSCULAR | Status: AC
  Administered 2023-04-19: 60 mg via INTRAVENOUS
  Filled 2023-04-19: qty 6

## 2023-04-19 MED ORDER — AZITHROMYCIN 250 MG PO TABS
ORAL_TABLET | ORAL | 0 refills | Status: AC
  Filled 2023-04-19: qty 1, 0d supply, fill #0

## 2023-04-19 MED ORDER — FUROSEMIDE 10 MG/ML IJ SOLN: 60.0000 mg | Freq: Two times a day (BID) | INTRAMUSCULAR | Status: DC

## 2023-04-19 NOTE — Progress Notes (Signed)
SaO2 on room air at rest = 100% SaO2 on room air while ambulating = 95%  Did not require oxygen while ambulating.    04/19/23 1235  Vitals  ECG Heart Rate 72  Resp (!) 21  MEWS COLOR  MEWS Score Color Green  Oxygen Therapy  SpO2 95 %  O2 Device Room Air  Patient Activity (if Appropriate) Ambulating  MEWS Score  MEWS Temp 0  MEWS Systolic 0  MEWS Pulse 0  MEWS RR 1  MEWS LOC 0  MEWS Score 1

## 2023-04-19 NOTE — Discharge Instructions (Addendum)
You were hospitalized for shortness of breath due to heart failure and COPD exacerbation. You were treated with IV lasix and breathing treatments including prednisone and azithromycin. At this time, your breathing has greatly improved and I feel you are medically stable for discharge. Thank you for allowing [place] to be part of your care.   Please follow up with your PCP within 1-2 weeks   Please note these changes made to your medications:   *Please START taking:  Prednisone 20mg  for three more days Azithromycin for 1 more day. Both of these will help you breath easier  *Please CONTINUE taking:   For your COPD, to breathe easier:  Albuterol inhaler as needed Dulera, inhale two puffs twice daily Spiriva, inhale 1 capsule daily Duoneb: take every 6 hours as needed  For your heart failure, to urinate off some fluid: Torsemide 20mg   For your blood pressure: Amlodipine 1 tablet daily Imdur 1/2 tablet per day Metoprolol 1 200mg  tablet daily Hydralazine 1 tablet three times per day  For your heart health: Atorvastatin once daily Clopidogrel once daily Ranexa 1 tablet twice per day   Please make sure to return to the hospital if you have worsening shortness of breath  Please call our clinic if you have any questions or concerns, we may be able to help and keep you from a long and expensive emergency room wait. Our clinic and after hours phone number is [phone], the best time to call is Monday through Friday 9 am to 4 pm but there is always someone available 24/7 if you have an emergency. If you need medication refills please notify your pharmacy one week in advance and they will send [place] a request.

## 2023-04-19 NOTE — Progress Notes (Signed)
Pt has been given lengthy discharge instructions, IV and Tele Removed, Pt needs refills on Neb solution ans Pepcid. And revision on Doctors note to return on Monday 8/12

## 2023-04-19 NOTE — Discharge Summary (Signed)
Name: [name] MRN: [id number] DOB: 12/10/1940 82 y.o. PCP: Claiborne Rigg, NP  Date of Admission: 04/18/2023  7:03 AM Date of Discharge: 04/19/23 Attending Physician: Dr. Cleda Daub  Discharge Diagnosis: Principal Problem:   Acute exacerbation of CHF (congestive heart failure) [facility])    Discharge Medications: Allergies as of 04/19/2023       Reactions   Acyclovir And Related Other (See Comments)   Pt states he is not familiar with this medication.   Aspirin Other (See Comments)   GI upset at high doses only.        Medication List     TAKE these medications    albuterol 108 (90 Base) MCG/ACT inhaler Commonly known as: VENTOLIN HFA Inhale 1-2 puffs into the lungs every 6 (six) hours as needed for wheezing or shortness of breath.   AMLODIPINE BESYLATE PO Take 1 tablet by mouth daily.   atorvastatin 80 MG tablet Commonly known as: LIPITOR Take 1 tablet (80 mg total) by mouth once daily.   azithromycin 250 MG tablet Commonly known as: ZITHROMAX Please take one tablet on 8/10. Start taking on: April 20, 2023   clopidogrel 75 MG tablet Commonly known as: PLAVIX Take 4 tablets (300 mg total) by mouth tonight, 7/25 at around 9pm for 1 day, THEN 1 tablet (75 mg total) daily starting 7/26 Start taking on: April 04, 2023   [facility] 200-5 MCG/ACT Aero Generic drug: mometasone-formoterol Inhale 2 puffs into the lungs 2 (two) times daily.   famotidine 40 MG tablet Commonly known as: Pepcid Take 1 tablet (40 mg total) by mouth daily.   hydrALAZINE 50 MG tablet Commonly known as: APRESOLINE Take 1 tablet (50 mg total) by mouth 3 (three) times daily.   ipratropium-albuterol 0.5-2.5 (3) MG/3ML Soln Commonly known as: DUONEB Take 3 mLs by nebulization every 6 (six) hours as needed (wheezing or shortness of breath).   isosorbide mononitrate 30 MG 24 hr tablet Commonly known as: IMDUR Take 0.5 tablets (15 mg total) by mouth daily.   metoprolol 200 MG 24 hr  tablet Commonly known as: TOPROL-XL Take 1 tablet (200 mg total) by mouth daily. Take with or immediately following a meal.   potassium chloride 10 MEQ tablet Commonly known as: KLOR-CON M Take 1 tablet (10 mEq total) by mouth daily.   predniSONE 20 MG tablet Commonly known as: DELTASONE Take 1 tablet (20 mg total) by mouth daily with breakfast for 3 days. Start taking on: April 20, 2023   ranolazine 500 MG 12 hr tablet Commonly known as: Ranexa Take 1 tablet (500 mg total) by mouth 2 (two) times daily.   tiotropium 18 MCG inhalation capsule Commonly known as: SPIRIVA Place 1 capsule (18 mcg total) into inhaler and inhale daily.   torsemide 20 MG tablet Commonly known as: DEMADEX Take 1 tablet (20 mg total) by mouth daily as needed for weight gain of 3 lbs overnight or 5 lbs in 1 week.        Disposition and follow-up:   [name] was discharged from [facility] in Stable condition.  At the hospital follow up visit please address:  1.  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 COPD: ensure patient is breathing at baseline, does not have any questions about breathing treatments   c. CAD: no chest pain, taking ASA, Plavix, atorvastatin   d. Afib: stable  2.  Labs / imaging needed at time of follow-up: none  3.  Pending labs/ test needing follow-up: none  4.  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 History is significant for HF with mildly recovered EF 40-45% with severe concentric left ventricular hypertrophy. BNP in the ED was elevated to 1600. He only takes his home torsemide when he is feeling poorly. In the past GDMT was discontinued due to his renal function. His creatinine is now  at 1.16. No signs of low output HF. Patient received IV lasix 60mg  x3 over 2 days with urine output of over 4L. At time of discharge, patient appeared euvolemic and was breathing at his baseline.   #COPD exacerbation Patient presented with increased shortness of breath and sputum production for the past several days with mild wheezing present on exam. Covid test negative. Patient also continues to smoke. He was treated with a 5 day course of prednisone, 3 days of azithromycin, and ipratropium nebulizers q6 hours. At time of discharge, patient was breathing normally with nasal cannula dangling from his nose. Patient was discharged with azithromycin and prednisone.   #CAD s/p PCI #Elevated troponin PCI placed 4/24. Home meds include plavix and ASA, though he has not been taking at home. Troponin in ED was mildly elevated then flat. No chest pain. ASA, plavix, atorvastatin were restarted.   #Paroxysmal Afib He follows with EP and is not anticoagulated as low burden of Afib  #Normocytic anemia Hemoglobin 9.5, stable   Discharge Subjective: Saw patient this am. Stated he has been breathing well and is feeling much better. Wants to go home. We told him he could go home today if he is feeling well enough. He expressed some confusion about his home medications and doesn't know why he was taking all of them. We assured him we would include explanations about how and why to use his meds in his discharge instructions. Denies shortness of breath, chest pain.   Discharge Exam:   BP (!) 140/74 (BP Location: Right Arm)   Pulse 67   Temp 98.4 F (36.9 C) (Oral)   Resp 19   Ht 5\' 8"  (1.727 m)   Wt 62.1 kg   SpO2 100%   BMI 20.82 kg/m  Constitutional: well-appearing, sitting in bed, in no acute distress HENT: normocephalic atraumatic, mucous membranes moist Eyes: conjunctiva non-erythematous Neck: supple Cardiovascular: regular rate and rhythm, no m/r/g Pulmonary/Chest: normal work of breathing on  room air, lungs clear to auscultation bilaterally. No wheezes auscultated Abdominal: soft, non-tender, non-distended MSK: normal bulk and tone Neurological: alert & oriented x 3, 5/5 strength in bilateral upper and lower extremities, normal gait Skin: warm and dry  Pertinent Labs, Studies, and Procedures:     Latest Ref Rng & Units 04/19/2023    2:01 AM 04/18/2023    8:01 AM 04/18/2023    7:28 AM  CBC  WBC 4.0 - 10.5 K/uL 6.7   6.6   Hemoglobin 13.0 - 17.0 g/dL 9.7  57.8  9.5   Hematocrit 39.0 - 52.0 % 30.5  30.0  31.1   Platelets 150 - 400 K/uL 179   154        Latest Ref Rng & Units 04/19/2023    2:01 AM 04/18/2023    8:01 AM 04/18/2023    7:28 AM  CMP  Glucose  70 - 99 mg/dL 161   98   BUN 8 - 23 mg/dL 26   13   Creatinine 0.96 - 1.24 mg/dL 0.45   4.09   Sodium 811 - 145 mmol/L 138  146  144   Potassium 3.5 - 5.1 mmol/L 4.3  3.6  3.6   Chloride 98 - 111 mmol/L 102   109   CO2 22 - 32 mmol/L 25   23   Calcium 8.9 - 10.3 mg/dL 8.9   8.5     DG Chest Portable 1 View  Result Date: 04/18/2023 CLINICAL DATA:  shortness of breath EXAM: PORTABLE CHEST 1 VIEW COMPARISON:  Chest XR, 04/03/2023 and 04/02/2023. CT chest, 12/06/2022. FINDINGS: Support lines: LEFT subclavian-approach AICD with intact leads. Overlying cutaneous leads. Cardiac silhouette enlargement. Aortic atherosclerosis. Mild hypoinflation with similar appearance of patchy RIGHT basilar opacities. No discrete consolidation. No pleural effusion or pneumothorax. No acute osseous abnormality. IMPRESSION: Cardiomegaly without acute superimposed cardiopulmonary process. Electronically Signed   By: Roanna Banning M.D.   On: 04/18/2023 07:44     Discharge Instructions: Discharge Instructions     Diet - low sodium heart healthy   Complete by: As directed    Increase activity slowly   Complete by: As directed        Signed: Monna Fam, MD PGY-1 04/19/2023, 11:18 AM   Pager: [phone]

## 2023-04-19 NOTE — TOC Initial Note (Addendum)
Transition of Care [facility]) - Initial/Assessment Note    Patient Details  Name: [name] MRN: [id number] Date of Birth: [birth year]  Transition of Care [facility]) CM/SW Contact:    Leone Haven, RN Phone Number: 04/19/2023, 12:12 PM  Clinical Narrative:                 From home with his grandson, Feliz Beam, he has PCP, Meredeth Ide at [facility] clinic, he states he has insurance, [place], and he pays 4.00 for his medication copays.  He states he has no HH agency in place at this time, he states he only has a neb machine, NCM asked if he has any oxygen he kept saying he has neb machine,  he states he will call himself a cab to get home and he will need a note for work so that he can return to work on Monday. He is a Electrical engineer.  His grandson , Feliz Beam is his support system, pta self ambulatory.   He has follow up apt at [facility] clinic on [facility].  Expected Discharge Plan: Home/Self Care Barriers to Discharge: No Barriers Identified   Patient Goals and CMS Choice Patient states their goals for this hospitalization and ongoing recovery are:: return home   Choice offered to / list presented to : NA      Expected Discharge Plan and Services In-house Referral: NA Discharge Planning Services: CM Consult Post Acute Care Choice: NA Living arrangements for the past 2 months: Single Family Home Expected Discharge Date: 04/19/23               DME Arranged: N/A DME Agency: NA       HH Arranged: NA          Prior Living Arrangements/Services Living arrangements for the past 2 months: Single Family Home Lives with:: Adult Children (grandson) Patient language and need for interpreter reviewed:: Yes Do you feel safe going back to the place where you live?: Yes      Need for Family Participation in Patient Care: Yes (Comment) Care giver support system in place?: Yes (comment) Current Home services (DME) neb machine Criminal Activity/Legal Involvement Pertinent to Current  Situation/Hospitalization: No - Comment as needed  Activities of Daily Living Home Assistive Devices/Equipment: Nebulizer, Walker (specify type) ADL Screening (condition at time of admission) Patient's cognitive ability adequate to safely complete daily activities?: Yes Is the patient deaf or have difficulty hearing?: No Does the patient have difficulty seeing, even when wearing glasses/contacts?: Yes Does the patient have difficulty concentrating, remembering, or making decisions?: No Patient able to express need for assistance with ADLs?: Yes Does the patient have difficulty dressing or bathing?: No Independently performs ADLs?: Yes (appropriate for developmental age) Does the patient have difficulty walking or climbing stairs?: Yes Weakness of Legs: None Weakness of Arms/Hands: None  Permission Sought/Granted                  Emotional Assessment   Attitude/Demeanor/Rapport: Engaged Affect (typically observed): Appropriate Orientation: : Oriented to Self, Oriented to Place, Oriented to  Time, Oriented to Situation Alcohol / Substance Use: Not Applicable Psych Involvement: No (comment)  Admission diagnosis:  Shortness of breath [R06.02] COPD exacerbation (HCC) [J44.1] Acute exacerbation of CHF (congestive heart failure) (HCC) [I50.9] Congestive heart failure, unspecified HF chronicity, unspecified heart failure type [facility]) [I50.9] Patient Active Problem List   Diagnosis Date Noted   Acute exacerbation of CHF (congestive heart failure) (HCC) 04/03/2023   Hypertensive emergency 04/03/2023   Acute  hypoxic respiratory failure (HCC) 04/03/2023   COPD exacerbation (HCC) 04/03/2023   Acute respiratory distress 04/02/2023   Abdominal pain 12/15/2022   Non-ST elevation (NSTEMI) myocardial infarction [facility]) 12/15/2022   Persistent atrial fibrillation (HCC) 12/15/2022   Status post coronary artery stent placement 12/13/2022   Myocardial infarction sequelae (HCC) 12/13/2022    Chest pain 12/11/2022   Unstable angina (HCC) 12/10/2022   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   Depression 09/15/2018   OSA (obstructive sleep apnea) 04/18/2018   Healthcare maintenance 04/18/2018   Essential hypertension 02/06/2017   Dizziness 02/06/2017   Coronary artery disease involving native coronary artery of native heart without angina pectoris 02/06/2017   Near syncope 02/06/2017   COPD with acute exacerbation (HCC) 01/17/2017   Allergic rhinitis 01/17/2017   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   Nocturnal hypoxemia 07/03/2016   Facial abscess 02/04/2016   Dental abscess    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/12/2014   Angina decubitus 06/15/2014   Acute renal failure (HCC) 05/31/2014   Sinus bradycardia 05/31/2014   Syncope 05/30/2014   Chronic combined systolic and diastolic heart failure (HCC)    Mixed Ischemic/Non-Ischemic Cardiomyopathy    NSVT (nonsustained ventricular tachycardia) (HCC)    Mixed hyperlipidemia    Respiratory arrest (HCC) 05/18/2014   CAD (coronary artery disease) 05/18/2014   Pulmonary edema 05/10/2014   HTN (hypertension) 05/08/2014   LBBB (left bundle branch block) 05/08/2014   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   Low back pain 10/05/2013   NSVT (nonsustained ventricular tachycardia) (HCC) 09/04/2013   Pneumonia 09/02/2013   Flu-like symptoms 09/02/2013   Anemia 09/02/2013   COPD with asthma    Fatigue 06/18/2013   Nocturia 06/18/2013   Dyspnea 06/18/2013   LV dysfunction 12/13/2011   CAD (coronary artery disease) 10/10/2011   Tobacco abuse 10/10/2011   Dyslipidemia    Noncompliance    PCP:  Claiborne Rigg, NP Pharmacy:   [facility] [address] [place] [address] Phone: [phone] Fax: [phone]  [name] [address] [place] [address] Phone: [phone] Fax:  [phone]  [facility] [place], [place] - [address] [place] [address] Phone: [phone] Fax: [phone]     Social Determinants of Health (SDOH) Social History: SDOH Screenings   Food Insecurity: No Food Insecurity (04/18/2023)  Housing: Low Risk  (04/18/2023)  Transportation Needs: No Transportation Needs (04/18/2023)  Utilities: Not At Risk (04/18/2023)  Alcohol Screen: Low Risk  (12/13/2022)  Depression (PHQ2-9): Low Risk  (01/14/2023)  Recent Concern: Depression (PHQ2-9) - Medium Risk (11/05/2022)  Financial Resource Strain: High Risk (12/13/2022)  Tobacco Use: Medium Risk (04/19/2023)   SDOH Interventions:     Readmission Risk Interventions     No data to display

## 2023-04-19 NOTE — Progress Notes (Signed)
Nursing Discharge Note  Name: [name] MRN: [id number] DOB: 04/18/1941   Admit Date: 04/18/2023 Discharge date: 04/19/2023  ABISAI RIGO is to be discharged home per MD order.  AVS completed. Reviewed with patient and family at bedside by discharge nurse Lamelva RN. Highlighted copy provided for patient to take home.  Patient able to verbalize understanding of discharge instructions. PIV removed. Patient stable upon discharge.   Patient received discharge medications from [facility]. When Lamelva RN was reviewing medication list, patient stated he did not have refills on albuterol nebulizer solutions and famotidine. Patient also requested work note for his job, Internal Medicine Team made aware and work note provided.   Discharge Instructions     Diet - low sodium heart healthy   Complete by: As directed    Increase activity slowly   Complete by: As directed        Allergies as of 04/19/2023       Reactions   Acyclovir And Related Other (See Comments)   Pt states he is not familiar with this medication.   Aspirin Other (See Comments)   GI upset at high doses only.        Medication List     TAKE these medications    albuterol 108 (90 Base) MCG/ACT inhaler Commonly known as: VENTOLIN HFA Inhale 1-2 puffs into the lungs every 6 (six) hours as needed for wheezing or shortness of breath. Notes to patient: To improve breathing    AMLODIPINE BESYLATE PO Take 1 tablet by mouth daily. Notes to patient: For High Blood pressure   atorvastatin 80 MG tablet Commonly known as: LIPITOR Take 1 tablet (80 mg total) by mouth once daily. Notes to patient: For High Cholesterol    azithromycin 250 MG tablet Commonly known as: ZITHROMAX Please take one tablet on 8/10. Start taking on: April 20, 2023 Notes to patient: To clear up infection   clopidogrel 75 MG tablet Commonly known as: PLAVIX Take 4 tablets (300 mg total) by mouth tonight, 7/25 at around 9pm for 1 day, THEN  1 tablet (75 mg total) daily starting 7/26 Start taking on: April 04, 2023 Notes to patient: To prevent blood clots   Dulera 200-5 MCG/ACT Aero Generic drug: mometasone-formoterol Inhale 2 puffs into the lungs 2 (two) times daily. Notes to patient: To improve breath and prevent flare up    famotidine 40 MG tablet Commonly known as: Pepcid Take 1 tablet (40 mg total) by mouth daily.   hydrALAZINE 50 MG tablet Commonly known as: APRESOLINE Take 1 tablet (50 mg total) by mouth 3 (three) times daily. Notes to patient: For High Blood Pressure    ipratropium-albuterol 0.5-2.5 (3) MG/3ML Soln Commonly known as: DUONEB Take 3 mLs by nebulization every 6 (six) hours as needed (wheezing or shortness of breath).   isosorbide mononitrate 30 MG 24 hr tablet Commonly known as: IMDUR Take 0.5 tablets (15 mg total) by mouth daily. Notes to patient: For High lung pressures   metoprolol 200 MG 24 hr tablet Commonly known as: TOPROL-XL Take 1 tablet (200 mg total) by mouth daily. Take with or immediately following a meal. Notes to patient: For Blood Pressure   potassium chloride 10 MEQ tablet Commonly known as: KLOR-CON M Take 1 tablet (10 mEq total) by mouth daily. Notes to patient: To replete/maintain  potassium level   predniSONE 20 MG tablet Commonly known as: DELTASONE Take 1 tablet (20 mg total) by mouth daily with breakfast for 3 days. Start  taking on: April 20, 2023 Notes to patient: Doreatha Martin to clear up inflammation    ranolazine 500 MG 12 hr tablet Commonly known as: Ranexa Take 1 tablet (500 mg total) by mouth 2 (two) times daily. Notes to patient: To improve blood flow    tiotropium 18 MCG inhalation capsule Commonly known as: SPIRIVA Place 1 capsule (18 mcg total) into inhaler and inhale daily. Notes to patient: To improve breathing and prevent flare up    torsemide 20 MG tablet Commonly known as: DEMADEX Take 1 tablet (20 mg total) by mouth daily as needed for weight  gain of 3 lbs overnight or 5 lbs in 1 week. Notes to patient: To remove excess fluids        Discharge Instructions/ Education: Discharge instructions given to patient with verbalized understanding. Discharge education completed with patient including: follow up instructions, medication list, discharge activities, and limitations if indicated.  Patient escorted via wheelchair to lobby and discharged home via private automobile.

## 2023-04-19 NOTE — TOC Transition Note (Addendum)
Transition of Care [facility]) - CM/SW Discharge Note   Patient Details  Name: [name] MRN: [id number] Date of Birth: 1941-05-31  Transition of Care [facility]) CM/SW Contact:  Leone Haven, RN Phone Number: 04/19/2023, 12:25 PM   Clinical Narrative:    Patient is for dc today, he will call him self a cab, TOC to fill meds.  He states he has Medicaid and he pays 4.00 copays for his meds. Per Cristopher Peru with Pharmacy they did not see any insurance for this patient, he [facility], so he must pay 4.00 there for his medications. His medications are only $2 for today which he can pay for.    Final next level of care: Home/Self Care Barriers to Discharge: No Barriers Identified   Patient Goals and CMS Choice   Choice offered to / list presented to : NA  Discharge Placement                         Discharge Plan and Services Additional resources added to the After Visit Summary for   In-house Referral: NA Discharge Planning Services: CM Consult Post Acute Care Choice: NA          DME Arranged: N/A DME Agency: NA       HH Arranged: NA          Social Determinants of Health (SDOH) Interventions SDOH Screenings   Food Insecurity: No Food Insecurity (04/18/2023)  Housing: Low Risk  (04/18/2023)  Transportation Needs: No Transportation Needs (04/18/2023)  Utilities: Not At Risk (04/18/2023)  Alcohol Screen: Low Risk  (12/13/2022)  Depression (PHQ2-9): Low Risk  (01/14/2023)  Recent Concern: Depression (PHQ2-9) - Medium Risk (11/05/2022)  Financial Resource Strain: High Risk (12/13/2022)  Tobacco Use: Medium Risk (04/19/2023)     Readmission Risk Interventions     No data to display

## 2023-04-19 NOTE — Progress Notes (Signed)
Heart Failure Nurse Navigator Progress Note  PCP: Claiborne Rigg, NP PCP-Cardiologist: Jens Som Admission Diagnosis: COPD exacerbation, shortness of breath, congestive heart failure.  Admitted from: Home via EMS  Presentation:   [name] presented with shortness of breath, EMS gave 5 mg Albuterol. Patient recently hospitalized 2 weeks prior for hypertension, respiratory distress. BP on admission 181/96, HR 87, BNP 1,618, EKG showed V-paced, IV lasix given,   Patient has been seen in HF TOC on 12/28/2022 and 02/06/2023. He was reeducated on the sign and symptoms of heart failure, daily weights, when to call his doctor or go to the ED, Diet/ fluid restrictions, taking all medications as prescribed and attending all medical appointments. Patient verbalized his understanding. A HF TOC follow up appointment was scheduled for 04/29/2023 @ 12 noon.   ECHO/ LVEF: 40-45% G1DD  Clinical Course:  Past Medical History:  Diagnosis Date   Acute renal failure (HCC) 05/31/2014   Angina decubitus 06/15/2014   CAD (coronary artery disease)    a. s/p stent RCA DES 9/09; b. 2014 Attempted PCI of OM1 @ [place];  c. 04/2014 Cath: LAD 40-50p, D1 95-99 (chronic), LCX 30-40 inf branch, OM1 CTO, RCA 30-40p, RCA patent stent, EF 35%->Med Rx.   Chronic combined systolic and diastolic CHF (congestive heart failure) (HCC)    a. EF about 40 to 45% per echo in April 2013;  b. 04/2014 Echo: EF 20-25%, sev LVH, sev glob HK, Gr 1 DD, mildly reduced RV fxn, PASP . c. 01/2017: EF improved to 50-55%.    Chronic low back pain    Complete heart block (HCC)    COPD (chronic obstructive pulmonary disease) (HCC) dx 06/2013   PFTs 07/08/13: mod obst with resp to bronchodilator, moderate decrease diffusion, airtrapping   COPD with acute exacerbation (HCC) 01/17/2017   COPD with asthma    07/08/13 PFT: FEV1 1.74L (66% pred, 30% change with BD), mod obst with resp to bronchodilator, moderate decrease diffusion,  air-trapping 11/2013 Simple spiro>> clear obstruction, FEV1 1.30 L (47% pred) - trial of symbicort 160 2bid 01/26/15      Depression 09/15/2018   Assessment: Increased sadness and depression since losing job  Plan: Patient denies suicidal and homicidal ideations Patient would not like to start medication therapies at this time Will establish patient with community health and wellness for primary care   Dyslipidemia    a. on statin   Essential hypertension    HLD (hyperlipidemia)    HTN (hypertension)    a. Reports intolerance to hydralazine; b. no beta blockers 2/2 bradycardia;  c. failed on ACE and ARB.   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 04/2014 Echo: EF 20-25%, sev glob HK.   Mixed Ischemic/Non-Ischemic Cardiomyopathy    probable mixed ischemic and non-ischemic    Noncompliance    OSA (obstructive sleep apnea) 04/18/2018   06/11/16 - home sleep study shows AHI of 2.9 an hour with the lowest SaO2 of 79% with an average of 93%  03/08/2018-Home sleep study-AHI 7/HR, SaO2 low 81%    Paroxysmal atrial fibrillation (HCC)    a. identified on device interrogation 01/2016   Pneumonia 09/02/2013   Pulmonary edema 05/10/2014   Respiratory arrest (HCC) 05/18/2014   Sinus bradycardia 05/31/2014   Ventricular tachycardia (HCC)    treated with ATP,  CL 250-300 msec  Social History   Socioeconomic History   Marital status: Married    Spouse name: Not on file   Number of children: 3   Years of education: Not on file   Highest education level: Not on file  Occupational History   Occupation: security guard.  Tobacco Use   Smoking status: Former    Current packs/day: 0.00    Average packs/day: 0.5 packs/day for 56.0 years (28.0 ttl pk-yrs)    Types: Cigarettes    Start date: 09/10/1956    Quit date: 09/10/2012    Years since quitting:  10.6   Smokeless tobacco: Never   Tobacco comments:    tried 1 month ago (05/2018) but didn't like it   Vaping Use   Vaping status: Never Used  Substance and Sexual Activity   Alcohol use: No   Drug use: No   Sexual activity: Not Currently  Other Topics Concern   Not on file  Social History Narrative          Social Determinants of Health   Financial Resource Strain: High Risk (12/13/2022)   Overall Financial Resource Strain (CARDIA)    Difficulty of Paying Living Expenses: Hard  Food Insecurity: No Food Insecurity (04/18/2023)   Hunger Vital Sign    Worried About Running Out of Food in the Last Year: Never true    Ran Out of Food in the Last Year: Never true  Transportation Needs: No Transportation Needs (04/18/2023)   PRAPARE - Administrator, Civil Service (Medical): No    Lack of Transportation (Non-Medical): No  Physical Activity: Not on file  Stress: Not on file  Social Connections: Not on file   Education Assessment and Provision:  Detailed education and instructions provided on heart failure disease management including the following:  Signs and symptoms of Heart Failure When to call the physician Importance of daily weights Low sodium diet Fluid restriction Medication management Anticipated future follow-up appointments  Patient education given on each of the above topics.  Patient acknowledges understanding via teach back method and acceptance of all instructions.  Education Materials:  "Living Better With Heart Failure" Booklet, HF zone tool, & Daily Weight Tracker Tool.  Patient has scale at home: yes Patient has pill box at home: NA    High Risk Criteria for Readmission and/or Poor Patient Outcomes: Heart failure hospital admissions (last 6 months): 0  No Show rate: 10% Difficult social situation: Lives with Grandson Demonstrates medication adherence: No, needs help with setting up medications Primary Language: English Literacy level:  Reading, writing, and comprehension  Barriers of Care:   Medication compliance ( unsure what and when to take medications)  Diet/ fluid restrictions ( salt)  Daily weights  Considerations/Referrals:   Referral made to Heart Failure Pharmacist Stewardship: Yes Referral made to Heart Failure CSW/NCM TOC: NA Referral made to Heart & Vascular TOC clinic: Yes, F/U 04/29/2023 @ 12 noon.  Items for Follow-up on DC/TOC: Medication compliance with understanding of what med is and when to take it.  Diet/ fluid restrictions ( salt) Daily weights   Rhae Hammock, BSN, RN Heart Failure Print production planner Chat Only

## 2023-04-19 NOTE — Progress Notes (Signed)
Heart Failure Stewardship Pharmacist Progress Note   PCP: Claiborne Rigg, NP PCP-Cardiologist: Peter Swaz[place], MD    HPI:  82 yo M with PMH of COPD, CAD, CHF, and afib.   Admitted 12/2022 with chest pain and VT that was seen on his device in office visit. He was set up for elective cardiac cath. Cath on 4/1 revealed 3 vessel obstructive CAD s/p PCI with DES to mid LAD. Planned staged PCI of prox and mid RCA on 4/3. Plan to treat PL and OM branch disease medically. ECHO on 4/2 showed LVEF 30-35%, global hypokinesis, severe LVH, and G1DD. Previous ECHO on 01/2022 also showed LVEF 35%. Taken back to cath lab on 4/3 and successful stenting of prox and mid RCA with DES but with resultant no reflow due to poor distal vessel runoff. Repeat ECHO 4/6 with EF 35-40%.  Had follow up with HF TOC clinic on 4/19. Noted shortness of breath with light activity (chronic and stable). He was restarted on Entresto. Followed up with TOC clinic on 5/29. He was restarted on torsemide and started on hydralazine. Unsure if he was taking Entresto at this time.   Admitted 03/2023 with acute COPD and HF exacerbation. Was diuresed and ECHO showed EF 40-45%. Discharged on torsemide PRN, metoprolol, hydralazine, and Imdur.   Discharge HF Medications: Diuretic: torsemide 20 mg daily PRN Beta Blocker: metoprolol XL 200 mg daily Other: hydralazine 50 mg TID + Imdur 15 mg daily  Prior to admission HF Medications: Diuretic: torsemide 20 mg daily PRN Beta Blocker: metoprolol XL 200 mg daily Other: hydralazine 50 mg TID + Imdur 15 mg daily  Pertinent Lab Values: Serum creatinine 1.16>1.50, BUN 26, Potassium 4.3, Sodium 138, BNP 1618.2  Vital Signs: Weight: 136 lbs (admission weight: 136 lbs) Blood pressure: 140/70s  Heart rate: 60s  I/O: net -3.6L yesterday; net -4L since admission  Medication Assistance / Insurance Benefits Check: Does the patient have prescription insurance?  No   Does the patient qualify for  medication assistance through manufacturers or grants?   Yes Eligible grants and/or patient assistance programs: [employer] Medication assistance applications in progress: none Medication assistance applications approved: [employer] Approved medication assistance renewals will be completed by: [facility]  Outpatient Pharmacy:  Prior to admission outpatient pharmacy: [facility] Is the patient willing to use [facility] TOC pharmacy at discharge? Yes   Assessment: 1. Acute on systolic CHF (LVEF 40-45%), due to ICM. NYHA class II symptoms. - Off IV lasix, resuming PRN torsemide on discharge - Continue metoprolol XL 200 mg daily - Continue hydralazine and Imdur - Consider restarting Entresto and starting Farxiga as outpatient if renal function improved. Renal function generally unstable.    Plan: 1) Medication changes recommended at this time: - Start [name] / [name] at follow up if renal function improved  2) Patient assistance: - Uninsured - will need patient assistance for medications. Entresto patient assistance expired in June - Patient states he has not received any patient assistance supplied medications in about 6 months - He is requesting to have indication written on his discharge paperwork next to each medication (ex: cholesterol, heart, etc). - He plans to bring his Grandson to his HF TOC appt to see if he could assist with filling pill boxes  3)  Education  - Patient has been educated on current HF medications and potential additions to HF medication regimen - Patient verbalizes understanding that over the next few months, these medication doses may change and more medications  may be added to optimize HF regimen - Patient has been educated on basic disease state pathophysiology and goals of therapy  Sharen Hones, PharmD, BCPS Heart Failure Stewardship Pharmacist Phone [phone]

## 2023-04-22 ENCOUNTER — Ambulatory Visit: Payer: Self-pay | Admitting: Nurse Practitioner

## 2023-04-22 ENCOUNTER — Telehealth: Payer: Self-pay

## 2023-04-22 NOTE — Transitions of Care (Post Inpatient/ED Visit) (Signed)
   04/22/2023  Name: [name] MRN: [id number] DOB: 07/08/1941  Today's TOC FU Call Status: Today's TOC FU Call Status:: Unsuccessful Call (1st Attempt) Unsuccessful Call (1st Attempt) Date: 04/22/23  Attempted to reach the patient regarding the most recent Inpatient/ED visit.  Follow Up Plan: Additional outreach attempts will be made to reach the patient to complete the Transitions of Care (Post Inpatient/ED visit) call.   Signature Robyne Peers, RN

## 2023-04-23 ENCOUNTER — Other Ambulatory Visit: Payer: Self-pay | Admitting: Nurse Practitioner

## 2023-04-23 ENCOUNTER — Telehealth: Payer: Self-pay

## 2023-04-23 ENCOUNTER — Ambulatory Visit: Payer: Self-pay | Admitting: Adult Health

## 2023-04-23 ENCOUNTER — Ambulatory Visit: Payer: No Typology Code available for payment source | Admitting: Nurse Practitioner

## 2023-04-23 ENCOUNTER — Other Ambulatory Visit: Payer: Self-pay

## 2023-04-23 MED ORDER — AMLODIPINE BESYLATE 10 MG PO TABS
10.0000 mg | ORAL_TABLET | Freq: Every day | ORAL | 1 refills | Status: DC
  Filled 2023-04-23: qty 90, 90d supply, fill #0

## 2023-04-23 NOTE — Transitions of Care (Post Inpatient/ED Visit) (Signed)
   04/23/2023  Name: [name] MRN: [id number] DOB: 01-11-1941  Today's TOC FU Call Status: Today's TOC FU Call Status:: Successful TOC FU Call Completed Unsuccessful Call (1st Attempt) Date: 04/19/23 [facility] FU Call Complete Date: 04/23/23  Transition Care Management Follow-up Telephone Call Date of Discharge: 04/19/23 Discharge Facility: [name]) Type of Discharge: Inpatient Admission Primary Inpatient Discharge Diagnosis:: acute exacerbation of CHF How have you been since you were released from the hospital?: Better (He said hs is feeling alot better   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  He also has a nebulizer and scale.  he said he did not have any questions about the med regime and he did not need to review the med list. We did review the impotance of daily weights and keeping a log of the weights.  we also reviewed the orders for torsemide and when to take that. he said he understood.   I reminded him to take all of his pills with him to his upcoming appointments. Any new allergies since your discharge?: No Dietary orders reviewed?: Yes Type of Diet Ordered:: heart healthy , low sodium Do you have support at home?: Yes People in Home: grandchild(ren) Name of Support/Comfort Primary Source: he lives with his grandson  Medications Reviewed Today: Medications Reviewed Today   Medications were not reviewed in this encounter     Home Care and Equipment/Supplies: Were Home Health Services Ordered?: No Any new equipment or medical supplies ordered?: No  Functional Questionnaire: Do you need assistance with bathing/showering or dressing?:  No Do you need assistance with meal preparation?: No Do you need assistance with eating?: No Do you have difficulty maintaining continence: No Do you need assistance with getting out of bed/getting out of a chair/moving?: No Do you have difficulty managing or taking your medications?: No  Follow up appointments reviewed: PCP Follow-up appointment confirmed?: Yes Date of PCP follow-up appointment?: 05/01/23 Follow-up Provider: Corene Cornea, PA Specialist Hospital Follow-up appointment confirmed?: Yes Date of Specialist follow-up appointment?: 04/29/23 Follow-Up Specialty Provider:: cardiology Do you need transportation to your follow-up appointment?: No Do you understand care options if your condition(s) worsen?: Yes-patient verbalized understanding    SIGNATURE Robyne Peers, RN

## 2023-04-23 NOTE — Telephone Encounter (Signed)
He is requesting a refill of amlodipine

## 2023-04-29 ENCOUNTER — Encounter (HOSPITAL_COMMUNITY): Payer: Self-pay

## 2023-04-29 ENCOUNTER — Ambulatory Visit (HOSPITAL_COMMUNITY)
Admit: 2023-04-29 | Discharge: 2023-04-29 | Disposition: A | Discharge status: Home / Self Care | Payer: Self-pay | Attending: Physician Assistant | Admitting: Physician Assistant

## 2023-04-29 ENCOUNTER — Other Ambulatory Visit: Payer: Self-pay

## 2023-04-29 VITALS — BP 180/78 | HR 74 | Wt 143.4 lb

## 2023-04-29 DIAGNOSIS — I428 Other cardiomyopathies: Secondary | ICD-10-CM | POA: Diagnosis not present

## 2023-04-29 DIAGNOSIS — I480 Paroxysmal atrial fibrillation: Secondary | ICD-10-CM | POA: Diagnosis not present

## 2023-04-29 DIAGNOSIS — Z7902 Long term (current) use of antithrombotics/antiplatelets: Secondary | ICD-10-CM | POA: Insufficient documentation

## 2023-04-29 DIAGNOSIS — I110 Hypertensive heart disease with heart failure: Secondary | ICD-10-CM | POA: Insufficient documentation

## 2023-04-29 DIAGNOSIS — Z91199 Patient's noncompliance with other medical treatment and regimen due to unspecified reason: Secondary | ICD-10-CM | POA: Diagnosis not present

## 2023-04-29 DIAGNOSIS — Z955 Presence of coronary angioplasty implant and graft: Secondary | ICD-10-CM | POA: Diagnosis not present

## 2023-04-29 DIAGNOSIS — F1010 Alcohol abuse, uncomplicated: Secondary | ICD-10-CM | POA: Diagnosis not present

## 2023-04-29 DIAGNOSIS — I472 Ventricular tachycardia, unspecified: Secondary | ICD-10-CM | POA: Insufficient documentation

## 2023-04-29 DIAGNOSIS — Z556 Problems related to health literacy: Secondary | ICD-10-CM | POA: Insufficient documentation

## 2023-04-29 DIAGNOSIS — I10 Essential (primary) hypertension: Secondary | ICD-10-CM

## 2023-04-29 DIAGNOSIS — I2510 Atherosclerotic heart disease of native coronary artery without angina pectoris: Secondary | ICD-10-CM | POA: Diagnosis not present

## 2023-04-29 DIAGNOSIS — I447 Left bundle-branch block, unspecified: Secondary | ICD-10-CM | POA: Insufficient documentation

## 2023-04-29 DIAGNOSIS — E785 Hyperlipidemia, unspecified: Secondary | ICD-10-CM | POA: Diagnosis not present

## 2023-04-29 DIAGNOSIS — Z79899 Other long term (current) drug therapy: Secondary | ICD-10-CM | POA: Diagnosis not present

## 2023-04-29 DIAGNOSIS — I5022 Chronic systolic (congestive) heart failure: Secondary | ICD-10-CM | POA: Insufficient documentation

## 2023-04-29 LAB — COMPREHENSIVE METABOLIC PANEL
ALT: 18 U/L (ref 0–44)
AST: 24 U/L (ref 15–41)
Albumin: 3.6 g/dL (ref 3.5–5.0)
Alkaline Phosphatase: 89 U/L (ref 38–126)
Anion gap: 9 (ref 5–15)
BUN: 16 mg/dL (ref 8–23)
CO2: 27 mmol/L (ref 22–32)
Calcium: 8.6 mg/dL — ABNORMAL LOW (ref 8.9–10.3)
Chloride: 103 mmol/L (ref 98–111)
Creatinine, Ser: 1.35 mg/dL — ABNORMAL HIGH (ref 0.61–1.24)
GFR, Estimated: 53 mL/min — ABNORMAL LOW (ref >60)
Glucose, Bld: 84 mg/dL (ref 70–99)
Potassium: 3.8 mmol/L (ref 3.5–5.1)
Sodium: 139 mmol/L (ref 135–145)
Total Bilirubin: 0.6 mg/dL (ref 0.3–1.2)
Total Protein: 6.6 g/dL (ref 6.5–8.1)

## 2023-04-29 LAB — BRAIN NATRIURETIC PEPTIDE: B Natriuretic Peptide: 769.3 pg/mL — ABNORMAL HIGH (ref 0.0–100.0)

## 2023-04-29 MED ORDER — CLOPIDOGREL BISULFATE 75 MG PO TABS
75.0000 mg | ORAL_TABLET | Freq: Every day | ORAL | 6 refills | Status: DC
  Filled 2023-04-29 (×2): qty 30, 30d supply, fill #0

## 2023-04-29 MED ORDER — TORSEMIDE 20 MG PO TABS: 10.0000 mg | ORAL_TABLET | Freq: Every day | ORAL | Status: DC

## 2023-04-29 MED ORDER — POTASSIUM CHLORIDE CRYS ER 10 MEQ PO TBCR
10.0000 meq | EXTENDED_RELEASE_TABLET | Freq: Every day | ORAL | 6 refills | Status: DC
  Filled 2023-04-29 (×2): qty 30, 30d supply, fill #0

## 2023-04-29 MED ORDER — LOSARTAN POTASSIUM 50 MG PO TABS
50.0000 mg | ORAL_TABLET | Freq: Every day | ORAL | 3 refills | Status: DC
  Filled 2023-04-29: qty 30, 30d supply, fill #0

## 2023-04-29 MED ORDER — ATORVASTATIN CALCIUM 80 MG PO TABS
80.0000 mg | ORAL_TABLET | Freq: Every day | ORAL | 3 refills | Status: DC
  Filled 2023-04-29 (×2): qty 90, 90d supply, fill #0

## 2023-04-29 MED ORDER — METOPROLOL SUCCINATE ER 25 MG PO TB24
25.0000 mg | ORAL_TABLET | Freq: Every day | ORAL | 6 refills | Status: DC
  Filled 2023-04-29 (×2): qty 30, 30d supply, fill #0

## 2023-04-29 NOTE — Progress Notes (Addendum)
HEART & VASCULAR TRANSITION OF CARE PROGRESS NOTE     Referring Physician: Dr. Allyson Sabal Primary Care: Bertram Denver, NP Primary Cardiologist: Dr. [place] HF: Establish with Dr. Gala Romney  HPI: Referred to clinic by Dr. Allyson Sabal with Cardiology for heart failure consultation. 82 y.o. male with history of CAD, HFrEF/ICM, hx CRT-D, HTN, HLD, PAF (very frew episodes on prior device checks and not anticoagulated per EP).  Echo 11/15: EF 30%  EF 50-55% on echo 05/21.  EF down to 30-35% on echo 07/23.   Seen by EP for acute visit in 03/24 for VT requiring ATP. He was started on amiodarone.    LHC 12/10/22: 75% m LAD (RFR 0.84), CTO OM1 with left to left collaterals, 80% p RCA, 75% m RCA stent and 100% PLOM with left to left collaterals. He underwent successful PCI of mLAD with IVUS and DES and later staged PCI/placement of 2 overlapping DES in mid to distal RCA. Post intervention had no reflow w/ distal embolization c/b MI with troponin elevation up to 9,000. Medical management recommended. Echo during admit EF 30-35%. Limited echo day of discharge with improvement in EF to 50-55%.  He was discharged 04/05 but returned later that evening with recurrent chest pain/GI discomfort. Medical therapy recommended. Course complicated by AKI with Scr up to 3. 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  Entresto and loop diuretic held. Amiodarone also discontinued.   Limited echo 12/15/22: EF 35-40%, hypokinesis inferior and inferolateral wall, RV severely reduced  Seen for initial TOC visit on 04/19. Had been instructed to restart Entresto but did not obtain the medication. Seen for f/u 05/29 and was on no GDMT. Volume status elevated. Started on 20 mg Torsemide daily and imdur/hydralazine. Seen in ED on 02/28/23 with near syncope. Orthostatics positive. He was felt to be volume depleted and given IVF.  Readmitted 07/24 with acute respiratory failure 2/2 AECOPD and  hypertensive emergency w/ flash pulmonary edema. Brilinta switched to Plavix d/t concern this may be contributing to dyspnea. Echo during admit with EF 40-45%, severe concentric LVH, RV okay  Readmitted 08/08-08/09 with acute on chronic CHF and AECOPD. He was diuresed with IV lasix. Discharged on 20 mg Torsemide daily.  Here today for hospital follow-up.  No complaints other than fatigue, which he believes is due to taking too many medications. I reviewed his medication list with him. He is out of metoprolol, amlodipine and ranexa. Not taking aspirin, uncertain about hydralazine. Can only confirm he is taking plavix atorvastatin and famotidine. Prescribed 15 mg imdur daily, has bottles with 30 mg and 60 mg doses. Uses Torsemide sometimes + an old prescription of lasix. He does not have a medication list that he follows.   Denies dyspnea, orthopnea, PND or lower extremity edema. No chest pain. Walks 6,000-11,000 steps a day at work.  He does not have health insurance. 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  His wife recently passed away. Has trouble paying for medications and pills. Missed a lot of work with recent readmissions.  ICD interrogation: BiV paced 95%, OptiVol up and down last few weeks starting to trend up but below threshold, thoracic impedance trending below threshold, activity 1.6 hrs a day, 4 episodes monitored VT since 06/20 longest 38 seconds on July 9th   Past Medical History:  Diagnosis Date   Acute renal failure (HCC) 05/31/2014   Angina decubitus 06/15/2014   CAD (coronary artery disease)  a. s/p stent RCA DES 9/09; b. 2014 Attempted PCI of OM1 @ [place];  c. 04/2014 Cath: LAD 40-50p, D1 95-99 (chronic), LCX 30-40 inf branch, OM1 CTO, RCA 30-40p, RCA patent stent, EF 35%->Med Rx.   Chronic combined systolic and diastolic CHF (congestive heart failure) (HCC)    a. EF about 40 to 45% per echo in April 2013;  b. 04/2014 Echo: EF 20-25%, sev  LVH, sev glob HK, Gr 1 DD, mildly reduced RV fxn, PASP . c. 01/2017: EF improved to 50-55%.    Chronic low back pain    Complete heart block (HCC)    COPD (chronic obstructive pulmonary disease) (HCC) dx 06/2013   PFTs 07/08/13: mod obst with resp to bronchodilator, moderate decrease diffusion, airtrapping   COPD with acute exacerbation (HCC) 01/17/2017   COPD with asthma    07/08/13 PFT: FEV1 1.74L (66% pred, 30% change with BD), mod obst with resp to bronchodilator, moderate decrease diffusion, air-trapping 11/2013 Simple spiro>> clear obstruction, FEV1 1.30 L (47% pred) - trial of symbicort 160 2bid 01/26/15      Depression 09/15/2018   Assessment: Increased sadness and depression since losing job  Plan: Patient denies suicidal and homicidal ideations Patient would not like to start medication therapies at this time Will establish patient with community health and wellness for primary care   Dyslipidemia    a. on statin   Essential hypertension    HLD (hyperlipidemia)    HTN (hypertension)    a. Reports intolerance to hydralazine; b. no beta blockers 2/2 bradycardia;  c. failed on ACE and ARB.   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 04/2014 Echo: EF 20-25%, sev glob HK.   Mixed Ischemic/Non-Ischemic Cardiomyopathy    probable mixed ischemic and non-ischemic    Noncompliance    OSA (obstructive sleep apnea) 04/18/2018   06/11/16 - home sleep study shows AHI of 2.9 an hour with the lowest SaO2 of 79% with an average of 93%  03/08/2018-Home sleep study-AHI 7/HR, SaO2 low 81%    Paroxysmal atrial fibrillation (HCC)    a. identified on device interrogation 01/2016   Pneumonia 09/02/2013   Pulmonary edema 05/10/2014   Respiratory arrest (HCC) 05/18/2014   Sinus bradycardia 05/31/2014   Ventricular tachycardia (HCC)    treated with  ATP,  CL 250-300 msec    Current Outpatient Medications  Medication Sig Dispense Refill   albuterol (VENTOLIN HFA) 108 (90 Base) MCG/ACT inhaler Inhale 1-2 puffs into the lungs every 6 (six) hours as needed for wheezing or shortness of breath. 6.7 g 1   famotidine (PEPCID) 40 MG tablet Take 1 tablet (40 mg total) by mouth daily. 90 tablet 3   ipratropium-albuterol (DUONEB) 0.5-2.5 (3) MG/3ML SOLN Take 3 mLs by nebulization every 6 (six) hours as needed (wheezing or shortness of breath). 360 mL 1   losartan (COZAAR) 50 MG tablet Take 1 tablet (50 mg total) by mouth daily. 30 tablet 3   mometasone-formoterol (DULERA) 200-5 MCG/ACT AERO Inhale 2 puffs into the lungs 2 (two) times daily. 13 g 6   ranolazine (RANEXA) 500 MG 12 hr tablet Take 1 tablet (500 mg total) by mouth 2 (two) times daily. 60 tablet 2   tiotropium (SPIRIVA) 18 MCG inhalation capsule Place 1 capsule (18 mcg total) into inhaler and inhale daily. 30 capsule 11  atorvastatin (LIPITOR) 80 MG tablet Take 1 tablet (80 mg total) by mouth once daily. 90 tablet 3   clopidogrel (PLAVIX) 75 MG tablet Take 1 tablet (75 mg total) by mouth daily. 30 tablet 6   metoprolol succinate (TOPROL-XL) 25 MG 24 hr tablet Take 1 tablet (25 mg total) by mouth daily. Take with or immediately following a meal. 30 tablet 6   potassium chloride (KLOR-CON M) 10 MEQ tablet Take 1 tablet (10 mEq total) by mouth daily. 30 tablet 6   torsemide (DEMADEX) 20 MG tablet Take 0.5 tablets (10 mg total) by mouth daily.     No current facility-administered medications for this encounter.    Allergies  Allergen Reactions   Acyclovir And Related Other (See Comments)    Pt states he is not familiar with this medication.   Aspirin Other (See Comments)    GI upset at high doses only.      Social History   Socioeconomic History   Marital status: Widowed    Spouse name: Not on file   Number of children: 3   Years of education: Not on file   Highest education  level: Not on file  Occupational History   Occupation: security guard.  Tobacco Use   Smoking status: Former    Current packs/day: 0.00    Average packs/day: 0.5 packs/day for 56.0 years (28.0 ttl pk-yrs)    Types: Cigarettes    Start date: 09/10/1956    Quit date: 09/10/2012    Years since quitting: 10.6   Smokeless tobacco: Never   Tobacco comments:    tried 1 month ago (05/2018) but didn't like it   Vaping Use   Vaping status: Never Used  Substance and Sexual Activity   Alcohol use: No   Drug use: No   Sexual activity: Not Currently  Other Topics Concern   Not on file  Social History Narrative          Social Determinants of Health   Financial Resource Strain: Patient Declined (04/19/2023)   Overall Financial Resource Strain (CARDIA)    Difficulty of Paying Living Expenses: Patient declined  Food Insecurity: No Food Insecurity (04/18/2023)   Hunger Vital Sign    Worried About Running Out of Food in the Last Year: Never true    Ran Out of Food in the Last Year: Never true  Transportation Needs: No Transportation Needs (04/18/2023)   PRAPARE - Administrator, Civil Service (Medical): No    Lack of Transportation (Non-Medical): No  Physical Activity: Not on file  Stress: Not on file  Social Connections: Not on file  Intimate Partner Violence: Not At Risk (04/18/2023)   Humiliation, Afraid, Rape, and Kick questionnaire    Fear of Current or Ex-Partner: No    Emotionally Abused: No    Physically Abused: No    Sexually Abused: No      Family History  Problem Relation Age of Onset   Heart attack Neg Hx    Stroke Neg Hx     Vitals:   04/29/23 1151  BP: (!) 180/78  Pulse: 74  SpO2: 98%  Weight: 65 kg (143 lb 6.4 oz)     General:  Thin elderly male HEENT: normal Neck: supple. JVP 8-9. Carotids 2+ bilat; no bruits.  Cor: PMI nondisplaced. Regular rate & rhythm. No rubs, gallops or murmurs. Lungs: clear Abdomen: soft, nontender, nondistended.  Extremities:  no cyanosis, clubbing, rash, edema Neuro: alert & orientedx3. Affect pleasant  ECG:  BiV paced 76 bpm   ASSESSMENT & PLAN: 1. 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 EF as low as 20-25% in 2015. Recovered to 50-55% in May 2021. Severe LVH noted on echo dating back 10 + years. -EF down to 35% on echo 05/23.  -Limited echo 12/15/22: EF 35-40%, inferior and inferolateral hypokinesis, RV severely reduced -Echo 07/24: EF 40-45%, severe LVH, HK basal-mid inferior and inferolateral walls, RV okay -NYHA II-early III, confounded by physical deconditioning and COPD -Volume okay on exam. However, thoracic impedance below threshold and optivol starting to trend up. Optivol up and down last few months likely d/t intermittent compliance. Needs very close monitoring. Will enroll in remote monitoring with [name], RN. -Unintentional noncompliance with medical management and poor health literacy. Will need to keep regimen simple.  -Start 10 mg Torsemide daily -Start metoprolol succinate 25 mg daily -Start losartan 50 mg daily -Reviewed with Dr. Gala Romney. LVH may be d/t longstanding HTN vs hypertrophic cardiomyopathy but need to rule out cardiac amyloid. Check myeloma panel. Obtain cMRI once he has insurance. -CMET/BNP today  2. CAD -Remote stenting to RCA in 2009 with DES. Chronically occluded OM. -LHC 12/10/22 75% m LAD (RFR 0.84), CTO OM1 with left to left collaterals, 80% p RCA, 75% m RCA stent and 100% PLOM with left to left collaterals. S/p PCI of mLAD with IVUS and DES. Later had staged PCI/placement of 2 overlapping DES in mid to distal RCA. Post intervention had no reflow w/ distal embolization c/b MI with troponin elevation up to 9,000 -On plavix. Not taking aspirin regularly. Refilled aspirin today. -Continue 80 mg Atorvastatin daily -Restart beta blocker -Will keep off Ranexa and Imdur for now as he is not having angina  3. Atrial  fibrillation -Very low burden on device checks -No anticoagulation per EP  4. VT -Multiple episodes VT previously treated with ATP -Had been started on amiodarone 12/23, stopped after revascularization in 04/24 -4 episodes VT since 06/20, longest 38 seconds. No therapies. -Restarting metoprolol as above -Followed by Dr. Elberta Fortis. Has f/u in a few weeks.  5. Recent AKIs -Scr up to 3 in April after contrast and in setting of hypotension -Prior baseline 1.1. -Cr recently variable, 1.3-2 (most recently 1.5). Multiple admits with a/c CHF and uncontrolled. HTN.  6. HTN - BP uncontrolled - Meds as above   Referred to HFSW (PCP, Medications, Transportation, ETOH Abuse, Drug Abuse, Insurance, Financial ): Yes, no insurance. Assist with cost of meds. Will send medications to [facility] and [facility] HF CSW picked up meds and arranged in weekly pill box. Refer to Pharmacy: No  Refer to Home Health: No Refer to Advanced Heart Failure Clinic: yes  Refer to General Cardiology: No, already established    Follow up 1 week to assess med compliance, volume status and titrate GDMT then refer to establish with Dr. Gala Romney.

## 2023-04-29 NOTE — Addendum Note (Signed)
Encounter addended by: Noralee Space, RN on: 04/29/2023 2:26 PM  Actions taken: Clinical Note Signed

## 2023-04-29 NOTE — Patient Instructions (Addendum)
Medication Changes:  We have filled a weekly pill box for you, please bring this pill box and all medications to your appointment next week and we'll refill it for you  START  Baby aspirin 81 mg daily Lab Work:  Labs done today, your results will be available in MyChart, we will contact you for abnormal readings.  Testing/Procedures:  none  Referrals:  none  Special Instructions // Education:  Do the following things EVERYDAY: Weigh yourself in the morning before breakfast. Write it down and keep it in a log. Take your medicines as prescribed Eat low salt foods--Limit salt (sodium) to 2000 mg per day.  Stay as active as you can everyday Limit all fluids for the day to less than 2 liters   Follow-Up in: 1 week and again in 2 weeks  At the [facility] Clinic, you and your health needs are our priority. We have a designated team specialized in the treatment of Heart Failure. This Care Team includes your primary Heart Failure Specialized Cardiologist (physician), Advanced Practice Providers (APPs- Physician Assistants and Nurse Practitioners), and Pharmacist who all work together to provide you with the care you need, when you need it.   You may see any of the following providers on your designated Care Team at your next follow up:  Dr. Arvilla Meres Dr. Marca Ancona Dr. Marcos Eke, NP Robbie Lis, [place] [facility] [place], [place] Brynda Peon, NP Karle Plumber, PharmD   Please be sure to bring in all your medications bottles to every appointment.   Need to Contact us:  If you have any questions or concerns before your next appointment please send [place] a message through [place] or call our office at [phone].    TO LEAVE A MESSAGE FOR THE NURSE SELECT OPTION 2, PLEASE LEAVE A MESSAGE INCLUDING: YOUR NAME DATE OF BIRTH CALL BACK NUMBER REASON FOR CALL**this is important as we prioritize the call backs  YOU WILL RECEIVE A CALL  BACK THE SAME DAY AS LONG AS YOU CALL BEFORE 4:00 PM

## 2023-04-29 NOTE — Progress Notes (Signed)
Heart and Vascular Care Navigation  04/29/2023  [name] 06/24/1941 [id number]  Reason for Referral: financial concerns, medication access concerns/ lack of insurance   Engaged with patient face to face for initial visit for Heart and Vascular Care Coordination.                                                                                                   Assessment:    CSW met with pt to discuss current situation.  Pt lives alone after wife's death in 03-18-23.  Without her added income it has been a struggle to keep up with things financially.  Pt immigrated from [place] in the 60s and never became a citizen.  He is unsure of his current immigration status but he works for a Dance movement psychotherapist so must have some kind of current status I would think.  He has had medicare and medicaid in the past and has a Tree surgeon care but has no medicaid at this time just Medicare part A.  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  CSW had referred to apply for disability benefits back in April- he said he called and would be eligible for $1,400/month but had to bring proof of immigration status in order to complete application.    Pt reports he does not have any eviction or turn off notices and that he has managed to stay  on top of bills ok but it has been hard.  Reports no food insecurity at this time.  CSW assisted in paying for meds at [facility] and picking them up.  CSW emailed immigration advocacy groups to inquire how they can help him to get information regarding his immigration status- awaiting responses.                                  HRT/VAS Care Coordination     Patients Home Cardiology Office Heart Failure Clinic   Outpatient Care Team Social Worker   Social Worker Name: Rosetta Posner, Advanced HF clinic, [phone]    Living arrangements for the past 2 months Single Family Home   Lives with: Self   Patient Has Concern With Paying Medical Bills No   Does Patient Have Prescription Coverage? No   Home Assistive Devices/Equipment Nebulizer; Walker (specify type)   DME Agency NA   Current home services DME  home oxygen, neb machine       Social History:  SDOH Screenings   Food Insecurity: No Food Insecurity (04/18/2023)  Housing: Low Risk  (04/18/2023)  Transportation Needs: No Transportation Needs (04/18/2023)  Utilities: Not At Risk (04/18/2023)  Alcohol Screen: Low Risk  (04/19/2023)  Depression (PHQ2-9): Low Risk  (01/14/2023)  Recent Concern: Depression (PHQ2-9) - Medium Risk (11/05/2022)  Financial Resource Strain: High Risk (04/29/2023)  Tobacco Use: Medium Risk (04/29/2023)    SDOH Interventions: Financial Resources:    Employment about $2000/month  Food Insecurity:  None reported  Housing Insecurity:  None reported  Transportation:   Has car   CSW will continue to follow and assist as needed  Burna Sis, LCSW Clinical Social Worker Advanced [facility] Failure Clinic Desk#: [phone] Cell#: [phone]

## 2023-04-29 NOTE — Addendum Note (Signed)
Encounter addended by: Burna Sis, LCSW on: 04/29/2023 3:52 PM  Actions taken: Flowsheet accepted, Clinical Note Signed

## 2023-04-29 NOTE — Progress Notes (Signed)
Weekly pill box filled, pt aware and agreeable to take all meds. He will bring pill box and bottles to next weeks appointment to have it refilled.   Meredith Staggers, RN, BSN, [facility] Special[facility] Coordinator Advanced [facility] Failure Clinic

## 2023-04-29 NOTE — Addendum Note (Signed)
Encounter addended by: Andrey Farmer, PA-C on: 04/29/2023 2:09 PM  Actions taken: Clinical Note Signed

## 2023-05-01 ENCOUNTER — Telehealth: Payer: Self-pay | Admitting: Physician Assistant

## 2023-05-01 NOTE — Progress Notes (Deleted)
Patient ID: [name], male   DOB: Feb 14, 1941, 82 y.o.   MRN: [id number]  After hospitalization 8/8-8/9.  He was seen by cardiology 04/29/2023 Disposition and follow-up:   [name] was discharged from [facility] in Stable condition.  At the hospital follow up visit please address:   1.  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 COPD: ensure patient is breathing at baseline, does not have any questions about breathing treatments               c. CAD: no chest pain, taking ASA, Plavix, atorvastatin               d. Afib: stable   2.  Labs / imaging needed at time of follow-up: none   3.  Pending labs/ test needing follow-up: none   4.  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 History is significant for HF with mildly recovered EF 40-45% with severe concentric left ventricular hypertrophy. BNP in the ED was elevated to 1600. He only takes his home torsemide when he is feeling poorly. In the past GDMT was discontinued due to his renal function. His creatinine is now at 1.16. No signs of low output HF. Patient received IV lasix 60mg  x3 over 2 days with urine output of over 4L. At time of discharge, patient appeared euvolemic and was breathing at his baseline.    #COPD exacerbation Patient presented with increased shortness of breath and sputum production for the past several days with mild wheezing present on exam. Covid test negative. Patient also continues to smoke. He was treated with a 5 day course of prednisone, 3 days of azithromycin, and ipratropium nebulizers q6 hours. At time of discharge, patient was breathing normally with  nasal cannula dangling from his nose. Patient was discharged with azithromycin and prednisone.    #CAD s/p PCI #Elevated troponin PCI placed 4/24. Home meds include plavix and ASA, though he has not been taking at home. Troponin in ED was mildly elevated then flat. No chest pain. ASA, plavix, atorvastatin were restarted.    #Paroxysmal Afib He follows with EP and is not anticoagulated as low burden of Afib   #Normocytic anemia Hemoglobin 9.5, stable  From cardiology A/P 04/29/2023 ASSESSMENT & PLAN: 1. 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 EF as low as 20-25% in 2015. Recovered to 50-55% in May 2021. Severe LVH noted on echo dating back 10 + years. -EF down to 35% on echo 05/23.  -Limited echo 12/15/22: EF 35-40%, inferior and inferolateral hypokinesis, RV severely reduced -Echo 07/24: EF 40-45%, severe LVH, HK basal-mid inferior and inferolateral walls, RV okay -NYHA II-early III, confounded by physical deconditioning and COPD -Volume okay on exam. However, thoracic impedance below threshold and optivol starting to trend  up. Optivol up and down last few months likely d/t intermittent compliance. Needs very close monitoring. Will enroll in remote monitoring with [name], RN. -Unintentional noncompliance with medical management and poor health literacy. Will need to keep regimen simple.  -Start 10 mg Torsemide daily -Start metoprolol succinate 25 mg daily -Start losartan 50 mg daily -Reviewed with Dr. Gala Romney. LVH may be d/t longstanding HTN vs hypertrophic cardiomyopathy but need to rule out cardiac amyloid. Check myeloma panel. Obtain cMRI once he has insurance. -CMET/BNP today   2. CAD -Remote stenting to RCA in 2009 with DES. Chronically occluded OM. -LHC 12/10/22 75% m LAD (RFR 0.84), CTO OM1 with left to left collaterals, 80% p RCA, 75% m RCA stent and 100% PLOM with left to left collaterals. S/p PCI of mLAD with IVUS and DES.  Later had staged PCI/placement of 2 overlapping DES in mid to distal RCA. Post intervention had no reflow w/ distal embolization c/b MI with troponin elevation up to 9,000 -On plavix. Not taking aspirin regularly. Refilled aspirin today. -Continue 80 mg Atorvastatin daily -Restart beta blocker -Will keep off Ranexa and Imdur for now as he is not having angina   3. 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 No therapies. -Restarting metoprolol as above -Followed by Dr. Elberta Fortis. Has f/u in a few weeks.   5. Recent AKIs -Scr up to 3 in April after contrast and in setting of hypotension -Prior baseline 1.1. -Cr recently variable, 1.3-2 (most recently 1.5). Multiple admits with a/c CHF and uncontrolled. HTN.   6. HTN - BP uncontrolled - Meds as above

## 2023-05-07 ENCOUNTER — Ambulatory Visit (HOSPITAL_COMMUNITY): Payer: Self-pay

## 2023-05-08 ENCOUNTER — Other Ambulatory Visit: Payer: Self-pay

## 2023-05-09 ENCOUNTER — Other Ambulatory Visit: Payer: Self-pay

## 2023-05-10 ENCOUNTER — Ambulatory Visit (INDEPENDENT_AMBULATORY_CARE_PROVIDER_SITE_OTHER): Payer: Self-pay

## 2023-05-10 DIAGNOSIS — I472 Ventricular tachycardia, unspecified: Secondary | ICD-10-CM

## 2023-05-14 ENCOUNTER — Other Ambulatory Visit: Payer: Self-pay

## 2023-05-14 LAB — CUP PACEART REMOTE DEVICE CHECK
Battery Remaining Longevity: 43 mo
Battery Voltage: 2.96 V
Brady Statistic AP VP Percent: 46.39 %
Brady Statistic AP VS Percent: 0.18 %
Brady Statistic AS VP Percent: 52.45 %
Brady Statistic AS VS Percent: 0.98 %
Brady Statistic RA Percent Paced: 45.01 %
Brady Statistic RV Percent Paced: 96.4 %
Date Time Interrogation Session: 20240830172603
HighPow Impedance: 56 Ohm
Implantable Lead Connection Status: 753985
Implantable Lead Connection Status: 753985
Implantable Lead Connection Status: 753985
Implantable Lead Implant Date: 20151203
Implantable Lead Implant Date: 20151203
Implantable Lead Implant Date: 20151203
Implantable Lead Location: 753858
Implantable Lead Location: 753859
Implantable Lead Location: 753860
Implantable Lead Model: 4598
Implantable Lead Model: 5076
Implantable Lead Model: 6935
Implantable Pulse Generator Implant Date: 20221129
Lead Channel Impedance Value: 152 Ohm
Lead Channel Impedance Value: 152 Ohm
Lead Channel Impedance Value: 160.941
Lead Channel Impedance Value: 160.941
Lead Channel Impedance Value: 160.941
Lead Channel Impedance Value: 247 Ohm
Lead Channel Impedance Value: 304 Ohm
Lead Channel Impedance Value: 304 Ohm
Lead Channel Impedance Value: 304 Ohm
Lead Channel Impedance Value: 342 Ohm
Lead Channel Impedance Value: 342 Ohm
Lead Channel Impedance Value: 361 Ohm
Lead Channel Impedance Value: 361 Ohm
Lead Channel Impedance Value: 532 Ohm
Lead Channel Impedance Value: 551 Ohm
Lead Channel Impedance Value: 551 Ohm
Lead Channel Impedance Value: 551 Ohm
Lead Channel Impedance Value: 589 Ohm
Lead Channel Pacing Threshold Amplitude: 0.625 V
Lead Channel Pacing Threshold Amplitude: 1.125 V
Lead Channel Pacing Threshold Amplitude: 1.5 V
Lead Channel Pacing Threshold Pulse Width: 0.4 ms
Lead Channel Pacing Threshold Pulse Width: 0.4 ms
Lead Channel Pacing Threshold Pulse Width: 0.6 ms
Lead Channel Sensing Intrinsic Amplitude: 1.375 mV
Lead Channel Sensing Intrinsic Amplitude: 1.375 mV
Lead Channel Sensing Intrinsic Amplitude: 7.25 mV
Lead Channel Sensing Intrinsic Amplitude: 7.25 mV
Lead Channel Setting Pacing Amplitude: 1.5 V
Lead Channel Setting Pacing Amplitude: 2.25 V
Lead Channel Setting Pacing Amplitude: 3.25 V
Lead Channel Setting Pacing Pulse Width: 0.4 ms
Lead Channel Setting Pacing Pulse Width: 0.6 ms
Lead Channel Setting Sensing Sensitivity: 0.3 mV
Zone Setting Status: 755011
Zone Setting Status: 755011

## 2023-05-14 NOTE — Progress Notes (Signed)
Remote ICD transmission.   

## 2023-05-15 ENCOUNTER — Telehealth: Payer: Self-pay

## 2023-05-15 ENCOUNTER — Encounter (HOSPITAL_COMMUNITY): Payer: Self-pay

## 2023-05-15 NOTE — Telephone Encounter (Signed)
Andrey Farmer, PA-C  [name], Vincent Igo, RN Are you able to follow this patient closely? Very frequent readmissions. Med compliance is an issue.

## 2023-05-15 NOTE — Telephone Encounter (Signed)
Referred to ICM clinic by Anna Genre, PA at HF clinic.   Attempted call to patient for ICM intro and left message for return call.

## 2023-05-16 ENCOUNTER — Inpatient Hospital Stay (HOSPITAL_COMMUNITY)
Admission: EM | Admit: 2023-05-16 | Discharge: 2023-05-23 | DRG: 190 | Disposition: A | Discharge status: Home / Self Care | Payer: Medicare Other | Attending: Internal Medicine | Admitting: Internal Medicine

## 2023-05-16 ENCOUNTER — Emergency Department (HOSPITAL_COMMUNITY): Payer: Medicare Other

## 2023-05-16 ENCOUNTER — Other Ambulatory Visit (HOSPITAL_COMMUNITY): Payer: Self-pay

## 2023-05-16 ENCOUNTER — Other Ambulatory Visit: Payer: Self-pay

## 2023-05-16 ENCOUNTER — Telehealth (HOSPITAL_COMMUNITY): Payer: Self-pay | Admitting: Licensed Clinical Social Worker

## 2023-05-16 DIAGNOSIS — I509 Heart failure, unspecified: Secondary | ICD-10-CM

## 2023-05-16 DIAGNOSIS — F17210 Nicotine dependence, cigarettes, uncomplicated: Secondary | ICD-10-CM | POA: Diagnosis present

## 2023-05-16 DIAGNOSIS — F32A Depression, unspecified: Secondary | ICD-10-CM | POA: Diagnosis present

## 2023-05-16 DIAGNOSIS — Z79899 Other long term (current) drug therapy: Secondary | ICD-10-CM

## 2023-05-16 DIAGNOSIS — I5042 Chronic combined systolic (congestive) and diastolic (congestive) heart failure: Secondary | ICD-10-CM | POA: Diagnosis present

## 2023-05-16 DIAGNOSIS — R0602 Shortness of breath: Principal | ICD-10-CM

## 2023-05-16 DIAGNOSIS — Z7951 Long term (current) use of inhaled steroids: Secondary | ICD-10-CM

## 2023-05-16 DIAGNOSIS — Z95810 Presence of automatic (implantable) cardiac defibrillator: Secondary | ICD-10-CM

## 2023-05-16 DIAGNOSIS — I255 Ischemic cardiomyopathy: Secondary | ICD-10-CM | POA: Diagnosis present

## 2023-05-16 DIAGNOSIS — Z7902 Long term (current) use of antithrombotics/antiplatelets: Secondary | ICD-10-CM

## 2023-05-16 DIAGNOSIS — I2510 Atherosclerotic heart disease of native coronary artery without angina pectoris: Secondary | ICD-10-CM | POA: Diagnosis present

## 2023-05-16 DIAGNOSIS — J9601 Acute respiratory failure with hypoxia: Secondary | ICD-10-CM | POA: Diagnosis present

## 2023-05-16 DIAGNOSIS — J441 Chronic obstructive pulmonary disease with (acute) exacerbation: Principal | ICD-10-CM | POA: Diagnosis present

## 2023-05-16 DIAGNOSIS — G4733 Obstructive sleep apnea (adult) (pediatric): Secondary | ICD-10-CM | POA: Diagnosis present

## 2023-05-16 DIAGNOSIS — J439 Emphysema, unspecified: Secondary | ICD-10-CM | POA: Diagnosis present

## 2023-05-16 DIAGNOSIS — I4729 Other ventricular tachycardia: Secondary | ICD-10-CM | POA: Diagnosis present

## 2023-05-16 DIAGNOSIS — I951 Orthostatic hypotension: Secondary | ICD-10-CM | POA: Diagnosis not present

## 2023-05-16 DIAGNOSIS — N1831 Chronic kidney disease, stage 3a: Secondary | ICD-10-CM | POA: Diagnosis present

## 2023-05-16 DIAGNOSIS — Z888 Allergy status to other drugs, medicaments and biological substances status: Secondary | ICD-10-CM

## 2023-05-16 DIAGNOSIS — J9621 Acute and chronic respiratory failure with hypoxia: Secondary | ICD-10-CM | POA: Diagnosis not present

## 2023-05-16 DIAGNOSIS — I5020 Unspecified systolic (congestive) heart failure: Secondary | ICD-10-CM

## 2023-05-16 DIAGNOSIS — M25512 Pain in left shoulder: Secondary | ICD-10-CM | POA: Diagnosis present

## 2023-05-16 DIAGNOSIS — I428 Other cardiomyopathies: Secondary | ICD-10-CM | POA: Diagnosis present

## 2023-05-16 DIAGNOSIS — E785 Hyperlipidemia, unspecified: Secondary | ICD-10-CM | POA: Diagnosis present

## 2023-05-16 DIAGNOSIS — Z955 Presence of coronary angioplasty implant and graft: Secondary | ICD-10-CM

## 2023-05-16 DIAGNOSIS — I130 Hypertensive heart and chronic kidney disease with heart failure and stage 1 through stage 4 chronic kidney disease, or unspecified chronic kidney disease: Secondary | ICD-10-CM | POA: Diagnosis present

## 2023-05-16 DIAGNOSIS — J9382 Other air leak: Secondary | ICD-10-CM | POA: Diagnosis not present

## 2023-05-16 DIAGNOSIS — J4489 Other specified chronic obstructive pulmonary disease: Secondary | ICD-10-CM

## 2023-05-16 DIAGNOSIS — I480 Paroxysmal atrial fibrillation: Secondary | ICD-10-CM | POA: Diagnosis present

## 2023-05-16 DIAGNOSIS — I4819 Other persistent atrial fibrillation: Secondary | ICD-10-CM | POA: Diagnosis present

## 2023-05-16 DIAGNOSIS — I5022 Chronic systolic (congestive) heart failure: Secondary | ICD-10-CM | POA: Diagnosis present

## 2023-05-16 DIAGNOSIS — Z91148 Patient's other noncompliance with medication regimen for other reason: Secondary | ICD-10-CM

## 2023-05-16 DIAGNOSIS — Z7982 Long term (current) use of aspirin: Secondary | ICD-10-CM

## 2023-05-16 DIAGNOSIS — I472 Ventricular tachycardia, unspecified: Secondary | ICD-10-CM | POA: Diagnosis not present

## 2023-05-16 LAB — BASIC METABOLIC PANEL
Anion gap: 19 — ABNORMAL HIGH (ref 5–15)
Anion gap: 14 (ref 5–15)
BUN: 24 mg/dL — ABNORMAL HIGH (ref 8–23)
BUN: 26 mg/dL — ABNORMAL HIGH (ref 8–23)
CO2: 27 mmol/L (ref 22–32)
CO2: 23 mmol/L (ref 22–32)
Calcium: 8.7 mg/dL — ABNORMAL LOW (ref 8.9–10.3)
Calcium: 8.9 mg/dL (ref 8.9–10.3)
Chloride: 97 mmol/L — ABNORMAL LOW (ref 98–111)
Chloride: 102 mmol/L (ref 98–111)
Creatinine, Ser: 1.76 mg/dL — ABNORMAL HIGH (ref 0.61–1.24)
Creatinine, Ser: 1.78 mg/dL — ABNORMAL HIGH (ref 0.61–1.24)
GFR, Estimated: 39 mL/min — ABNORMAL LOW (ref >60)
GFR, Estimated: 38 mL/min — ABNORMAL LOW (ref >60)
Glucose, Bld: 114 mg/dL — ABNORMAL HIGH (ref 70–99)
Glucose, Bld: 164 mg/dL — ABNORMAL HIGH (ref 70–99)
Potassium: 3.3 mmol/L — ABNORMAL LOW (ref 3.5–5.1)
Potassium: 4.2 mmol/L (ref 3.5–5.1)
Sodium: 143 mmol/L (ref 135–145)
Sodium: 139 mmol/L (ref 135–145)

## 2023-05-16 LAB — CBC
HCT: 34.4 % — ABNORMAL LOW (ref 39.0–52.0)
Hemoglobin: 10.7 g/dL — ABNORMAL LOW (ref 13.0–17.0)
MCH: 27.2 pg (ref 26.0–34.0)
MCHC: 31.1 g/dL (ref 30.0–36.0)
MCV: 87.5 fL (ref 80.0–100.0)
Platelets: 113 10*3/uL — ABNORMAL LOW (ref 150–400)
RBC: 3.93 MIL/uL — ABNORMAL LOW (ref 4.22–5.81)
RDW: 15 % (ref 11.5–15.5)
WBC: 6.8 10*3/uL (ref 4.0–10.5)
nRBC: 0 % (ref 0.0–0.2)

## 2023-05-16 LAB — TROPONIN I (HIGH SENSITIVITY)
Troponin I (High Sensitivity): 81 ng/L — ABNORMAL HIGH (ref <18)
Troponin I (High Sensitivity): 72 ng/L — ABNORMAL HIGH (ref <18)

## 2023-05-16 LAB — BRAIN NATRIURETIC PEPTIDE: B Natriuretic Peptide: 1329.2 pg/mL — ABNORMAL HIGH (ref 0.0–100.0)

## 2023-05-16 LAB — PROTIME-INR
INR: 1.1 (ref 0.8–1.2)
Prothrombin Time: 14.6 s (ref 11.4–15.2)

## 2023-05-16 LAB — MAGNESIUM: Magnesium: 1.6 mg/dL — ABNORMAL LOW (ref 1.7–2.4)

## 2023-05-16 MED ORDER — HYDRALAZINE HCL 25 MG PO TABS
25.0000 mg | ORAL_TABLET | Freq: Four times a day (QID) | ORAL | Status: DC
  Administered 2023-05-16: 25 mg via ORAL
  Filled 2023-05-16: qty 1

## 2023-05-16 MED ORDER — AZITHROMYCIN 250 MG PO TABS
500.0000 mg | ORAL_TABLET | Freq: Every day | ORAL | Status: AC
  Administered 2023-05-16 – 2023-05-18 (×3): 500 mg via ORAL
  Filled 2023-05-16 (×3): qty 2

## 2023-05-16 MED ORDER — POTASSIUM CHLORIDE CRYS ER 20 MEQ PO TBCR
40.0000 meq | EXTENDED_RELEASE_TABLET | Freq: Once | ORAL | Status: AC
  Administered 2023-05-16: 40 meq via ORAL
  Filled 2023-05-16: qty 2

## 2023-05-16 MED ORDER — UMECLIDINIUM BROMIDE 62.5 MCG/ACT IN AEPB
1.0000 | INHALATION_SPRAY | Freq: Every day | RESPIRATORY_TRACT | Status: DC
  Administered 2023-05-16 – 2023-05-18 (×3): 1 via RESPIRATORY_TRACT
  Filled 2023-05-16: qty 7

## 2023-05-16 MED ORDER — PREDNISONE 20 MG PO TABS
40.0000 mg | ORAL_TABLET | Freq: Every day | ORAL | Status: AC
  Administered 2023-05-16 – 2023-05-20 (×5): 40 mg via ORAL
  Filled 2023-05-16 (×5): qty 2

## 2023-05-16 MED ORDER — ENSURE ENLIVE PO LIQD
237.0000 mL | Freq: Two times a day (BID) | ORAL | Status: DC
  Administered 2023-05-16 – 2023-05-21 (×11): 237 mL via ORAL

## 2023-05-16 MED ORDER — CLOPIDOGREL BISULFATE 75 MG PO TABS
75.0000 mg | ORAL_TABLET | Freq: Every day | ORAL | Status: DC
  Administered 2023-05-16 – 2023-05-23 (×8): 75 mg via ORAL
  Filled 2023-05-16 (×9): qty 1

## 2023-05-16 MED ORDER — ACETAMINOPHEN 650 MG RE SUPP: 650.0000 mg | Freq: Four times a day (QID) | RECTAL | Status: DC | PRN

## 2023-05-16 MED ORDER — MOMETASONE FURO-FORMOTEROL FUM 200-5 MCG/ACT IN AERO
2.0000 | INHALATION_SPRAY | Freq: Two times a day (BID) | RESPIRATORY_TRACT | Status: DC
  Administered 2023-05-16 – 2023-05-18 (×4): 2 via RESPIRATORY_TRACT
  Filled 2023-05-16: qty 8.8

## 2023-05-16 MED ORDER — IPRATROPIUM-ALBUTEROL 0.5-2.5 (3) MG/3ML IN SOLN
3.0000 mL | Freq: Four times a day (QID) | RESPIRATORY_TRACT | Status: DC
  Administered 2023-05-16 – 2023-05-17 (×3): 3 mL via RESPIRATORY_TRACT
  Filled 2023-05-16 (×4): qty 3

## 2023-05-16 MED ORDER — LOSARTAN POTASSIUM 50 MG PO TABS: 50.0000 mg | ORAL_TABLET | Freq: Every day | ORAL | Status: DC

## 2023-05-16 MED ORDER — IPRATROPIUM-ALBUTEROL 0.5-2.5 (3) MG/3ML IN SOLN
3.0000 mL | RESPIRATORY_TRACT | Status: DC
  Administered 2023-05-16: 3 mL via RESPIRATORY_TRACT
  Filled 2023-05-16 (×2): qty 3

## 2023-05-16 MED ORDER — MAGNESIUM SULFATE 2 GM/50ML IV SOLN
2.0000 g | Freq: Once | INTRAVENOUS | Status: AC
  Administered 2023-05-16: 2 g via INTRAVENOUS
  Filled 2023-05-16: qty 50

## 2023-05-16 MED ORDER — HYDRALAZINE HCL 25 MG PO TABS
25.0000 mg | ORAL_TABLET | Freq: Three times a day (TID) | ORAL | Status: DC
  Administered 2023-05-16 – 2023-05-17 (×2): 25 mg via ORAL
  Filled 2023-05-16 (×2): qty 1

## 2023-05-16 MED ORDER — ASPIRIN 81 MG PO CHEW
81.0000 mg | CHEWABLE_TABLET | Freq: Every day | ORAL | Status: DC
  Administered 2023-05-16 – 2023-05-22 (×7): 81 mg via ORAL
  Filled 2023-05-16 (×8): qty 1

## 2023-05-16 MED ORDER — FUROSEMIDE 40 MG PO TABS
40.0000 mg | ORAL_TABLET | Freq: Two times a day (BID) | ORAL | Status: DC
  Administered 2023-05-16 (×2): 40 mg via ORAL
  Filled 2023-05-16 (×2): qty 1

## 2023-05-16 MED ORDER — IPRATROPIUM-ALBUTEROL 0.5-2.5 (3) MG/3ML IN SOLN
3.0000 mL | Freq: Once | RESPIRATORY_TRACT | Status: AC
  Administered 2023-05-16: 3 mL via RESPIRATORY_TRACT
  Filled 2023-05-16: qty 3

## 2023-05-16 MED ORDER — FUROSEMIDE 10 MG/ML IJ SOLN
40.0000 mg | Freq: Once | INTRAMUSCULAR | Status: AC
  Administered 2023-05-16: 40 mg via INTRAVENOUS
  Filled 2023-05-16: qty 4

## 2023-05-16 MED ORDER — BENZONATATE 100 MG PO CAPS
100.0000 mg | ORAL_CAPSULE | Freq: Three times a day (TID) | ORAL | Status: DC | PRN
  Administered 2023-05-16 – 2023-05-17 (×2): 100 mg via ORAL
  Filled 2023-05-16 (×2): qty 1

## 2023-05-16 MED ORDER — METOPROLOL SUCCINATE ER 25 MG PO TB24
25.0000 mg | ORAL_TABLET | Freq: Every day | ORAL | Status: DC
  Administered 2023-05-16: 25 mg via ORAL
  Filled 2023-05-16: qty 1

## 2023-05-16 MED ORDER — ACETAMINOPHEN 325 MG PO TABS
650.0000 mg | ORAL_TABLET | Freq: Four times a day (QID) | ORAL | Status: DC | PRN
  Administered 2023-05-21 – 2023-05-22 (×2): 650 mg via ORAL
  Filled 2023-05-16 (×2): qty 2

## 2023-05-16 MED ORDER — ENOXAPARIN SODIUM 30 MG/0.3ML IJ SOSY: 30.0000 mg | PREFILLED_SYRINGE | Freq: Every day | INTRAMUSCULAR | Status: DC

## 2023-05-16 MED ORDER — GUAIFENESIN-DM 100-10 MG/5ML PO SYRP
5.0000 mL | ORAL_SOLUTION | Freq: Once | ORAL | Status: AC
  Administered 2023-05-16: 5 mL via ORAL
  Filled 2023-05-16: qty 5

## 2023-05-16 MED ORDER — ATORVASTATIN CALCIUM 80 MG PO TABS
80.0000 mg | ORAL_TABLET | Freq: Every day | ORAL | Status: DC
  Administered 2023-05-16 – 2023-05-23 (×8): 80 mg via ORAL
  Filled 2023-05-16 (×9): qty 1

## 2023-05-16 MED ORDER — HYDRALAZINE HCL 25 MG PO TABS: 25.0000 mg | ORAL_TABLET | Freq: Four times a day (QID) | ORAL | Status: DC

## 2023-05-16 MED ORDER — ENOXAPARIN SODIUM 30 MG/0.3ML IJ SOSY
30.0000 mg | PREFILLED_SYRINGE | INTRAMUSCULAR | Status: DC
  Administered 2023-05-16 – 2023-05-22 (×7): 30 mg via SUBCUTANEOUS
  Filled 2023-05-16 (×7): qty 0.3

## 2023-05-16 NOTE — H&P (Signed)
Date: 05/16/2023               Patient Name:  [name] MRN: [id number]  DOB: 06-Dec-1940 Age / Sex: 82 y.o., male   PCP: Claiborne Rigg, NP         Medical Service: Internal Medicine Teaching Service         Attending Physician: Dr. Inez Catalina, MD      First Contact: Dr. Manuela Neptune, MD Pager [phone]    Second Contact: Dr. Marrianne Mood, MD Pager [phone]         After Hours (After 5p/  First Contact Pager: [phone]  weekends / holidays): Second Contact Pager: [phone]   SUBJECTIVE   Chief Complaint: shortness of breath  History of Present Illness: DREAN REMME is a 82 yo male with COPD, ischemic vascular disease, CAD s/p PCI, HFmrEF s/p CRT-D, paroxysmal Afib, and HTN who was recently discharged from the hospital on 04/19/23 for acute hypoxic respiratory failure from COPD exacerbation and HFmrEF exacerbation who presents with SOB.  Patient reported a one day history of feeling short of breath accompanied with wheezing, nonproductive cough, and chest tightness.He stated that he was breathing quickly and used one of his nebulizers, which helped a little bit; however, the shortness of breath returned. He denies sick contacts, URI, rhinorrhea, and changes to chronic cough. He uses a home inhaler, Bevespi, as needed.   Patient follows with [facility] cardiology. At the last appointment on 8/19, where he reported difficulty affording medications after his wife passed away and having to work more to afford his medications. During our conversation, he stated that he is able to afford his medications and that he is compliant with his medications. He denies orthopnea, chest pain, dizziness, feeling light headed, and lower extremity edema. He reports edema in his LE whenever he does have a heart failure exacerbation, but no edema today. Patient reported canceling a scheduled appointment with [facility] yesterday due to him not feeling well.     Meds:  Torsemide 10 mg  daily Metoprolol 25 mg daily Losartan 50 mg daily  Aspirin 81 Plavix 75 Atorvastatin 80 mg Bevespi PRN Dueonebs PRN   Current Meds  Medication Sig   albuterol (VENTOLIN HFA) 108 (90 Base) MCG/ACT inhaler Inhale 1-2 puffs into the lungs every 6 (six) hours as needed for wheezing or shortness of breath.   aspirin 81 MG chewable tablet Chew by mouth daily.   atorvastatin (LIPITOR) 80 MG tablet Take 1 tablet (80 mg total) by mouth once daily.   clopidogrel (PLAVIX) 75 MG tablet Take 1 tablet (75 mg total) by mouth daily.   famotidine (PEPCID) 40 MG tablet Take 1 tablet (40 mg total) by mouth daily.   ipratropium-albuterol (DUONEB) 0.5-2.5 (3) MG/3ML SOLN Take 3 mLs by nebulization every 6 (six) hours as needed (wheezing or shortness of breath).   losartan (COZAAR) 50 MG tablet Take 1 tablet (50 mg total) by mouth daily.   metoprolol succinate (TOPROL-XL) 25 MG 24 hr tablet Take 1 tablet (25 mg total) by mouth daily. Take with or immediately following a meal.   mometasone-formoterol (DULERA) 200-5 MCG/ACT AERO Inhale 2 puffs into the lungs 2 (two) times daily.   potassium chloride (KLOR-CON M) 10 MEQ tablet Take 1 tablet (10 mEq total) by mouth daily.   tiotropium (SPIRIVA) 18 MCG inhalation capsule Place 1 capsule (18 mcg total) into inhaler and inhale daily.   torsemide (DEMADEX) 20 MG tablet Take 0.5 tablets (10 mg  total) by mouth daily.    Past Medical History COPD Ischemic vascular disease CAD s/p PCI HFmrEF s/p CRT-D Paroxysmal Afib HTN  Past Surgical History:  Procedure Laterality Date   BI-VENTRICULAR IMPLANTABLE CARDIOVERTER DEFIBRILLATOR N/A 08/12/2014   MDT Ovidio Kin XT CRTD implanted by Dr Johney Frame   BIV ICD GENERATOR CHANGEOUT N/A 08/08/2021   Procedure: BIV ICD GENERATOR CHANGEOUT;  Surgeon: Hillis Range, MD;  Location: [facility] INVASIVE CV LAB;  Service: Cardiovascular;  Laterality: N/A;   CARDIAC CATHETERIZATION     ejection fraction 50%   CORONARY STENT INTERVENTION N/A  12/10/2022   Procedure: CORONARY STENT INTERVENTION;  Surgeon: Swaziland, Peter M, MD;  Location: [facility] INVASIVE CV LAB;  Service: Cardiovascular;  Laterality: N/A;   CORONARY STENT INTERVENTION N/A 12/12/2022   Procedure: CORONARY STENT INTERVENTION;  Surgeon: Swaziland, Peter M, MD;  Location: [facility] INVASIVE CV LAB;  Service: Cardiovascular;  Laterality: N/A;   CORONARY ULTRASOUND/IVUS N/A 12/10/2022   Procedure: Intravascular Ultrasound/IVUS;  Surgeon: Swaziland, Peter M, MD;  Location: [facility] INVASIVE CV LAB;  Service: Cardiovascular;  Laterality: N/A;   CORONARY ULTRASOUND/IVUS N/A 12/12/2022   Procedure: Coronary Ultrasound/IVUS;  Surgeon: Swaziland, Peter M, MD;  Location: [facility] INVASIVE CV LAB;  Service: Cardiovascular;  Laterality: N/A;   LEFT HEART CATH AND CORONARY ANGIOGRAPHY N/A 12/10/2022   Procedure: LEFT HEART CATH AND CORONARY ANGIOGRAPHY;  Surgeon: Swaziland, Peter M, MD;  Location: [facility] INVASIVE CV LAB;  Service: Cardiovascular;  Laterality: N/A;   LEFT HEART CATHETERIZATION WITH CORONARY ANGIOGRAM N/A 05/08/2014   Procedure: LEFT HEART CATHETERIZATION WITH CORONARY ANGIOGRAM;  Surgeon: Micheline Chapman, MD;  Location: [facility] CATH LAB;  Service: Cardiovascular;  Laterality: N/A;    Social:  Lives with oldest grandson  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   OBJECTIVE:   Physical Exam: Blood pressure (!) 188/106, temperature 98 F (36.7 C), resp. rate 16, SpO2 98%.  Constitutional: well-appearing, sitting in bed comfortably, in no acute distress HENT: normocephalic atraumatic, mucous membranes  moist Eyes: conjunctiva non-erythematous Neck: supple Cardiovascular: regular rate and rhythm Pulmonary/Chest: increased work of breathing on room air, speaks in short sentences, dyspnea while conversing; significant wheezing in all lung fields Abdominal: soft, non-tender, non-distended, BS present in all quadrants MSK: normal bulk and tone, no pitting edema  Neurological: alert & oriented x 3, moving all four extremities spontaneously, normal gait Skin: warm and dry Psych: normal mood and affect   Labs: CBC    Component Value Date/Time   WBC 6.8 05/16/2023 0153   RBC 3.93 (L) 05/16/2023 0153   HGB 10.7 (L) 05/16/2023 0153   HGB 10.5 (L) 01/14/2023 1615   HCT 34.4 (L) 05/16/2023 0153   HCT 33.7 (L) 01/14/2023 1615   PLT 113 (L) 05/16/2023 0153   PLT 183 01/14/2023 1615   MCV 87.5 05/16/2023 0153   MCV 89 01/14/2023 1615   MCH 27.2 05/16/2023 0153   MCHC 31.1 05/16/2023 0153   RDW 15.0 05/16/2023 0153   RDW 13.6 01/14/2023 1615   LYMPHSABS 1.9 04/18/2023 0728   LYMPHSABS 2.5 01/14/2023 1615   MONOABS 0.6 04/18/2023 0728  EOSABS 0.1 04/18/2023 0728   EOSABS 0.0 01/14/2023 1615   BASOSABS 0.0 04/18/2023 0728   BASOSABS 0.0 01/14/2023 1615     CMP     Component Value Date/Time   NA 143 05/16/2023 0153   NA 147 (H) 03/18/2023 0000   K 3.3 (L) 05/16/2023 0153   CL 97 (L) 05/16/2023 0153   CO2 27 05/16/2023 0153   GLUCOSE 114 (H) 05/16/2023 0153   BUN 24 (H) 05/16/2023 0153   BUN 17 03/18/2023 0000   CREATININE 1.76 (H) 05/16/2023 0153   CREATININE 0.83 04/17/2016 0330   CALCIUM 8.7 (L) 05/16/2023 0153   PROT 6.6 04/29/2023 1255   PROT 7.2 01/14/2023 1615   ALBUMIN 3.6 04/29/2023 1255   ALBUMIN 4.2 01/14/2023 1615   AST 24 04/29/2023 1255   ALT 18 04/29/2023 1255   ALKPHOS 89 04/29/2023 1255   BILITOT 0.6 04/29/2023 1255   BILITOT 0.4 01/14/2023 1615   GFRNONAA 39 (L) 05/16/2023 0153   GFRNONAA 88 04/17/2016 0330   GFRAA 90 01/12/2020 1539   GFRAA >89  04/17/2016 0330    Imaging: CLINICAL DATA:  Shortness of breath EXAM: CHEST - 2 VIEW COMPARISON:  04/18/2023 FINDINGS: Left AICD remains in place, unchanged. Cardiomegaly, aortic atherosclerosis. No confluent opacities or effusions. Underlying COPD with hyperinflation. IMPRESSION: COPD. Cardiomegaly. No active disease. Electronically Signed   By: Charlett Nose M.D.   On: 05/16/2023 02:07   EKG: personally reviewed my interpretation is paced rhythm at 83 BPM, occasional PVC present, Qtc is 535. Prior EKG has similar findings.   ASSESSMENT & PLAN:   Assessment & Plan by Problem: Principal Problem:   COPD exacerbation (HCC)   [name] is a 82 y.o. person living with a history of COPD, ischemic vascular disease, CAD s/p PCI, HFmrEF s/p CRT-D, paroxysmal Afib, and HTN who presented with shortness of breath and admitted for COPD exacerbation on hospital day 0  COPD Exacerbation Patient presents with increased shortness of breath, wheezing, and nonproductive cough likely due to COPD from medication noncompliance. Physical exam findings are remarkable for expiratory wheezes present in all lung fields, dyspnea while conversing, and no signs of edema.The COPD exacerbation is most likely due to using his DAILY inhaler (Bevespi) incorrectly, he was using it as PRN.  He continues to smoke about three cigarettes a day which is also likely contributing to the frequent exacerbations. Low suspicion for infection causing COPD exacerbation, no URI sx, fever, or leukocytosis.  Plan: -Start azithromycin 500 mg daily -Start prednisone 40mg  daily -Duonebs q4 prn -Provide education on daily vs PRN inhalers  -Start incruse ellipta, 1 puff daily  -Start Dulera 2 puffs BID   HFmrEF s/p CRT-D, LVEF 35-40% Patient presents with shortness of breath without orthopnea and an elevation in BNP of 1,329.2 (from 769.3). Physical exam findings are not congruent with volume overload: No crackles on  auscultation and no lower extremity edema and CXR did not show effusions or pulmonary edema.  Low suspicion for HFmrEF exacerbation at this time, he is compliant with his medications, denied orthopnea, there is no LE edema, no new/worsening arrhythmias or ischemia that could be causing an exacerbation at this moment. He did receive 40mg  IV Lasix in the ED, but once again he did not appear to be volume overloaded during our evaluation.  Prior Echocardiogram April 2024 LVEF 35-40% with moderate LVH Plan: -He was diuresed tonight, will hold off on further diuresing until day team evaluates volume status -Continue Metoprolol succinate 25mg   daily -Strict Is and Os -Daily weights, standing    CKD Stage 3a He presents with a Scr of 1.76, Scr baseline of ~1.5 (varies from 1.3-2). Serum Creatinine is slightly elevated from baseline, not quite in the AKI range yet. It appears that he was started on Losartan on 8/19, elevation in serum creatinine is possibly due to beginning Losartan. Plan: -Hold Losartan for now  -Avoid nephrotoxic agents -Repeat BMP in AM   HTN -Will hold due losartan due to elevation in serum creatinine  -Continue Metoprolol succinate 25mg  daily   CAD s/p PCI  Continue home medication regimen of Plavix 75gm, Lipitor 80mg , and Aspirin 81mg   Paroxsymal Atrial Fibrillation  -Continue Home metoprolol succinate 25mg  daily -No anticoagulation per EP      Diet: Heart Healthy VTE: Enoxaparin IVF: None,None Code: Full  Prior to Admission Living Arrangement: Home, living grandson Anticipated Discharge Location: Home Barriers to Discharge: Medical Treatment  Dispo: Admit patient to Observation with expected length of stay less than 2 midnights.  Signed: Faith Rogue, DO Internal Medicine Resident PGY-3  05/16/2023, 5:34 AM

## 2023-05-16 NOTE — ED Provider Notes (Signed)
[place] EMERGENCY DEPARTMENT AT [facility] Provider Note   CSN: [id number] Arrival date & time: 05/16/23  0145     History  Chief Complaint  Patient presents with   Shortness of Breath    [name] is a 82 y.o. male.  Patient presents to the emergency department complaining of shortness of breath with chest tightness which is worsened this evening.   He has been admitted twice over the last 2 months for similar complaints.  Tonight he complains of a dry cough which was unrelieved by his home medications.  He denies abdominal pain, nausea, vomiting. He has a history of CHF, coronary artery disease, COPD, hypertension, coronary stents in April 2024   Shortness of Breath      Home Medications Prior to Admission medications   Medication Sig Start Date End Date Taking? Authorizing Provider  albuterol (VENTOLIN HFA) 108 (90 Base) MCG/ACT inhaler Inhale 1-2 puffs into the lungs every 6 (six) hours as needed for wheezing or shortness of breath. 04/04/23  Yes Annett Fabian, MD  aspirin 81 MG chewable tablet Chew by mouth daily.   Yes [provider]  atorvastatin (LIPITOR) 80 MG tablet Take 1 tablet (80 mg total) by mouth once daily. 04/29/23  Yes Andrey Farmer, PA-C  clopidogrel (PLAVIX) 75 MG tablet Take 1 tablet (75 mg total) by mouth daily. 04/29/23  Yes Andrey Farmer, PA-C  famotidine (PEPCID) 40 MG tablet Take 1 tablet (40 mg total) by mouth daily. 04/19/23  Yes Monna Fam, MD  ipratropium-albuterol (DUONEB) 0.5-2.5 (3) MG/3ML SOLN Take 3 mLs by nebulization every 6 (six) hours as needed (wheezing or shortness of breath). 04/19/23  Yes Monna Fam, MD  losartan (COZAAR) 50 MG tablet Take 1 tablet (50 mg total) by mouth daily. 04/29/23  Yes Andrey Farmer, PA-C  metoprolol succinate (TOPROL-XL) 25 MG 24 hr tablet Take 1 tablet (25 mg total) by mouth daily. Take with or immediately following a meal. 04/29/23  Yes Andrey Farmer, PA-C   mometasone-formoterol [facility]) 200-5 MCG/ACT AERO Inhale 2 puffs into the lungs 2 (two) times daily. 04/04/23  Yes Annett Fabian, MD  potassium chloride (KLOR-CON M) 10 MEQ tablet Take 1 tablet (10 mEq total) by mouth daily. 04/29/23  Yes Andrey Farmer, PA-C  tiotropium (SPIRIVA) 18 MCG inhalation capsule Place 1 capsule (18 mcg total) into inhaler and inhale daily. 04/04/23  Yes Annett Fabian, MD  torsemide (DEMADEX) 20 MG tablet Take 0.5 tablets (10 mg total) by mouth daily. 04/29/23  Yes Andrey Farmer, PA-C      Allergies    Acyclovir and related and Aspirin    Review of Systems   Review of Systems  Respiratory:  Positive for shortness of breath.     Physical Exam Updated Vital Signs BP (!) 188/106 (BP Location: Left Arm)   Temp 98 F (36.7 C)   Resp 16   SpO2 98%  Physical Exam Vitals and nursing note reviewed.  Constitutional:      General: He is not in acute distress.    Appearance: He is well-developed.  HENT:     Head: Normocephalic and atraumatic.  Eyes:     Conjunctiva/sclera: Conjunctivae normal.  Cardiovascular:     Rate and Rhythm: Normal rate and regular rhythm.     Heart sounds: No murmur heard. Pulmonary:     Effort: Pulmonary effort is normal. No respiratory distress.     Breath sounds: Wheezing and rales present.  Chest:  Chest wall: No tenderness.  Abdominal:     Palpations: Abdomen is soft.     Tenderness: There is no abdominal tenderness.  Musculoskeletal:        General: No swelling.     Cervical back: Neck supple.  Skin:    General: Skin is warm and dry.     Capillary Refill: Capillary refill takes less than 2 seconds.  Neurological:     Mental Status: He is alert.  Psychiatric:        Mood and Affect: Mood normal.     ED Results / Procedures / Treatments   Labs (all labs ordered are listed, but only abnormal results are displayed) Labs Reviewed  BASIC METABOLIC PANEL - Abnormal; Notable for the following  components:      Result Value   Potassium 3.3 (*)    Chloride 97 (*)    Glucose, Bld 114 (*)    BUN 24 (*)    Creatinine, Ser 1.76 (*)    Calcium 8.7 (*)    GFR, Estimated 39 (*)    Anion gap 19 (*)    All other components within normal limits  CBC - Abnormal; Notable for the following components:   RBC 3.93 (*)    Hemoglobin 10.7 (*)    HCT 34.4 (*)    Platelets 113 (*)    All other components within normal limits  BRAIN NATRIURETIC PEPTIDE - Abnormal; Notable for the following components:   B Natriuretic Peptide 1,329.2 (*)    All other components within normal limits  TROPONIN I (HIGH SENSITIVITY) - Abnormal; Notable for the following components:   Troponin I (High Sensitivity) 81 (*)    All other components within normal limits  PROTIME-INR  TROPONIN I (HIGH SENSITIVITY)    EKG None  Radiology DG Chest 2 View  Result Date: 05/16/2023 CLINICAL DATA:  Shortness of breath EXAM: CHEST - 2 VIEW COMPARISON:  04/18/2023 FINDINGS: Left AICD remains in place, unchanged. Cardiomegaly, aortic atherosclerosis. No confluent opacities or effusions. Underlying COPD with hyperinflation. IMPRESSION: COPD. Cardiomegaly. No active disease. Electronically Signed   By: Charlett Nose M.D.   On: 05/16/2023 02:07    Procedures Procedures    Medications Ordered in ED Medications  ipratropium-albuterol (DUONEB) 0.5-2.5 (3) MG/3ML nebulizer solution 3 mL (3 mLs Nebulization Given 05/16/23 0310)  furosemide (LASIX) injection 40 mg (40 mg Intravenous Given 05/16/23 0338)  potassium chloride SA (KLOR-CON M) CR tablet 40 mEq (40 mEq Oral Given 05/16/23 1610)    ED Course/ Medical Decision Making/ A&P                                 Medical Decision Making Amount and/or Complexity of Data Reviewed Labs: ordered. Radiology: ordered.  Risk Prescription drug management.   This patient presents to the ED for concern of shortness of breath and chest tightness, this involves an extensive number of  treatment options, and is a complaint that carries with it a high risk of complications and morbidity.  The differential diagnosis includes heart failure exacerbation, COPD exacerbation, ACS, others   Co morbidities that complicate the patient evaluation  History of NSTEMI, implanted pacemaker defibrillator, COPD, CHF   Additional history obtained:   External records from outside source obtained and reviewed including discharge summaries from recent hospitalizations   Lab Tests:  I Ordered, and personally interpreted labs.  The pertinent results include:  BNP 1329.2, potassium 3.3, troponin  81   Imaging Studies ordered:  I ordered imaging studies including chest x-ray I independently visualized and interpreted imaging which showed  COPD.    Cardiomegaly   I agree with the radiologist interpretation   Cardiac Monitoring: / EKG:  The patient was maintained on a cardiac monitor.  I personally viewed and interpreted the cardiac monitored which showed an underlying rhythm of: Ventricular paced rhythm with occasional PVCs   Consultations Obtained:  I requested consultation with the internal medicine service,  and discussed lab and imaging findings as well as pertinent plan - they recommend: admission   Problem List / ED Course / Critical interventions / Medication management   I ordered medication including DuoNeb, Lasix, potassium  Reevaluation of the patient after these medicines showed that the patient improved I have reviewed the patients home medicines and have made adjustments as needed   Test / Admission - Considered:  Patient with apparent COPD exacerbation, CHF exacerbation. Needs admission for further evaluation and management         Final Clinical Impression(s) / ED Diagnoses Final diagnoses:  SOB (shortness of breath)  Acute on chronic congestive heart failure, unspecified heart failure type [facility])    Rx / DC Orders ED Discharge Orders      None         Pamala Duffel 05/16/23 0408    Palumbo, April, MD 05/16/23 0423

## 2023-05-16 NOTE — Progress Notes (Addendum)
C/o left shoulder and upper arm numbness. He is saying that it started after a PNA injection a month ago. I have offered tylenol and he said No. He would like to talk to a doctor about it. MD informed via secure chat. MD came to see patient.

## 2023-05-16 NOTE — Telephone Encounter (Signed)
Spoke with patient.  He reports he is admitted to [facility].  Advised will follow up in the next couple of weeks after he is discharged.

## 2023-05-16 NOTE — Congregational Nurse Program (Signed)
Dept: [phone]   Congregational Nurse Program Note  Date of Encounter: 05/16/2023  Past Medical History: Past Medical History:  Diagnosis Date   Acute renal failure (HCC) 05/31/2014   Angina decubitus 06/15/2014   CAD (coronary artery disease)    a. s/p stent RCA DES 9/09; b. 2014 Attempted PCI of OM1 @ [place];  c. 04/2014 Cath: LAD 40-50p, D1 95-99 (chronic), LCX 30-40 inf branch, OM1 CTO, RCA 30-40p, RCA patent stent, EF 35%->Med Rx.   Chronic combined systolic and diastolic CHF (congestive heart failure) (HCC)    a. EF about 40 to 45% per echo in April 2013;  b. 04/2014 Echo: EF 20-25%, sev LVH, sev glob HK, Gr 1 DD, mildly reduced RV fxn, PASP . c. 01/2017: EF improved to 50-55%.    Chronic low back pain    Complete heart block (HCC)    COPD (chronic obstructive pulmonary disease) (HCC) dx 06/2013   PFTs 07/08/13: mod obst with resp to bronchodilator, moderate decrease diffusion, airtrapping   COPD with acute exacerbation (HCC) 01/17/2017   COPD with asthma    07/08/13 PFT: FEV1 1.74L (66% pred, 30% change with BD), mod obst with resp to bronchodilator, moderate decrease diffusion, air-trapping 11/2013 Simple spiro>> clear obstruction, FEV1 1.30 L (47% pred) - trial of symbicort 160 2bid 01/26/15      Depression 09/15/2018   Assessment: Increased sadness and depression since losing job  Plan: Patient denies suicidal and homicidal ideations Patient would not like to start medication therapies at this time Will establish patient with community health and wellness for primary care   Dyslipidemia    a. on statin   Essential hypertension    HLD (hyperlipidemia)    HTN (hypertension)    a. Reports intolerance to hydralazine; b. no beta blockers 2/2 bradycardia;  c. failed on ACE and ARB.   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 04/2014 Echo: EF 20-25%, sev glob HK.   Mixed Ischemic/Non-Ischemic Cardiomyopathy    probable mixed ischemic and non-ischemic    Noncompliance    OSA (obstructive sleep apnea) 04/18/2018   06/11/16 - home sleep study shows AHI of 2.9 an hour with the lowest SaO2 of 79% with an average of 93%  03/08/2018-Home sleep study-AHI 7/HR, SaO2 low 81%    Paroxysmal atrial fibrillation (HCC)    a. identified on device interrogation 01/2016   Pneumonia 09/02/2013   Pulmonary edema 05/10/2014   Respiratory arrest (HCC) 05/18/2014   Sinus bradycardia 05/31/2014   Ventricular tachycardia (HCC)    treated with ATP,  CL 250-300 msec    Encounter Details:  CNP Questionnaire - 05/16/23 1727       Questionnaire   Ask client: Do you give verbal consent for me to treat you today? Yes    Student Assistance N/A    Location Patient Served  [facility]    Visit Setting with Client Hospital    Patient Status Migrant    Insurance Uninsured ([employer]/Care Connects/Self-Pay/Medicaid Family Planning)    Insurance/Financial Assistance Referral Charitable Care;Worried about Medical Debt    Medication N/A;Have Medication Insecurities    Medical Provider Yes    Screening Referrals Made N/A    Medical Referrals Made N/A    Medical Appointment Made N/A    Recently w/o PCP, now 1st time PCP visit completed due to CNs  referral or appointment made N/A    Transportation N/A    Interpersonal Safety N/A    Interventions Advocate/Support;Navigate Healthcare System;Case Management;Counsel;Educate    Abnormal to Normal Screening Since Last CN Visit N/A    Screenings CN Performed N/A    Sent Client to Lab for: N/A    Did client attend any of the following based off CNs referral or appointments made? N/A    ED Visit Averted N/A    Life-Saving Intervention Made N/A           Uninsured Immigrant patient who has been in [place] for ''many years" he is unsure of his immigration status, he does not have a  green card or citizenship.  I have directed patient to [employer]  for free citizenship classes  and free legal advice from [place].Address provided.   Nicole Cella Marta Bouie RN BSN PCCN  [facility] [phone]-cell [phone]-office

## 2023-05-16 NOTE — Progress Notes (Signed)
Please note per the patient's request. If patient ever become in respiratory distress. Please give a relaxant. The bipap causes severe panic attacks.

## 2023-05-16 NOTE — Progress Notes (Signed)
HD#0 SUBJECTIVE:   Interim History:  Resting comfortably in bed, SOB with movement and speaking. Used Bevespi, did not use his other meds due to him running out of them. Tried to get a refill, working on getting these for free through an assistance program.   OBJECTIVE:  Vital Signs: Vitals:   05/16/23 0705 05/16/23 0710 05/16/23 0937 05/16/23 1231  BP:  (!) 147/76 (!) 161/82 (!) 172/97  Pulse: 71 69 66 78  Resp:   16 16  Temp:   98.2 F (36.8 C) 98 F (36.7 C)  TempSrc:   Oral Oral  SpO2: 97% 96% 97% 98%  Weight:      Height:       Supplemental O2: Room Air SpO2: 98 %  Filed Weights   05/16/23 0540  Weight: 63.6 kg     Intake/Output Summary (Last 24 hours) at 05/16/2023 1305 Last data filed at 05/16/2023 1233 Gross per 24 hour  Intake 395.64 ml  Output 1425 ml  Net -1029.36 ml   Net IO Since Admission: -1,029.36 mL [05/16/23 1305]  Physical Exam:  Alert, resting comfortably in bed. Non-toxic appearing Loud wheezing throughout all lung lobes. No crackles. No accessory muscle use, mild tachypnea.  No mrg normal S1, S2  JVD + Hepatojugular reflex present. no edema in bilateral lower extremities Bowel sound present, non-tender to palpation. Non-distended.   Patient Lines/Drains/Airways Status     Active Line/Drains/Airways     Name Placement date Placement time Site Days   Peripheral IV 05/16/23 20 G 1" Anterior;Proximal;Right Forearm 05/16/23  0338  Forearm  less than 1             ASSESSMENT/PLAN:  Assessment: Principal Problem:   COPD exacerbation (HCC)   Plan:  [name] is a 82 y.o. with a history of COPD, ischemic vascular disease, CAD s/p PCI, HFmrEF s/p CRT-D, paroxysmal Afib, and HTN who presented with shortness of breath and admitted for COPD exacerbation on hospital day 0.   COPD Exacerbation Physical exam findings are remarkable for expiratory wheezes present in all lung fields, dyspnea while conversing, increased dry cough but no  fevers, chills, or increased sputum production, no leukocytosis, no pna on xr.The COPD exacerbation is most likely due to using his DAILY inhaler (Bevespi) incorrectly, he was using it as PRN.  He was last discharged on Spiriva, Dulera, and albuterol. Used these but did not get refills. Wife passed away and has been having trouble keeping up with meds since causing recurrent admissions. He continues to smoke about three cigarettes a day which is also likely contributing to the frequent exacerbations. -cw azithromycin 500 mg daily -cw prednisone 40mg  daily -cw Duonebs q4 prn -cw incruse ellipta, 1 puff daily  -cw Dulera 2 puffs BID    HFmrEF s/p CRT-D Prior Echocardiogram April 2024 LVEF 35-40% with moderate LVH Low suspicion for HFmrEF exacerbation at this time. Patient presents with shortness of breath without orthopnea, crackles or BLE edema. However, there is an elevation in BNP of 1,329.2 (from 769.3). 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 S/p furosemide 40mg  IV this AM. In the past GDMT was discontinued due to his renal function. In his last hospitalization plan was to start him on -Entresto / Farxiga if renal function permitted. He is also uninsured at the moment and needs financial assistance with meds.  -One more dose of furosemide 40mg  BID today  -cw metoprolol succinate 25mg  daily  -Strict I/O  -Daily weight   CKD Stage  3a He presents with a Scr of 1.76, Scr baseline of ~1.5 (varies from 1.3-2). Serum Creatinine is slightly elevated from baseline, not quite in the AKI range yet. It appears that he was started on Losartan on 8/19, elevation in serum creatinine is possibly due to beginning Losartan. Plan: -Hold Losartan for now  -Avoid nephrotoxic agents -Repeat BMP in AM   HTN -Will hold Losatan due to elevation in serum creatinine.  -Continue Metoprolol succinate 25mg  daily -Continue to monitor BP and symptoms     CAD s/p PCI  Continue home medication regimen of Plavix  75gm, Lipitor 80mg , and Aspirin 81mg    Paroxsymal Atrial Fibrillation has defibrillator in place -Continue Home metoprolol succinate 25mg  daily -No anticoagulation per EP   Social factors affecting medical care  Unable to follow up with meds, has had two hospitalizations in the last month and a half since his wife passed away. Lives with grandson at home. Uninsured.  -TOC consult placed   Diet: Heart Healthy VTE: Enoxaparin IVF: None,None Code: Full  Signature: Vincent Dunlap Alexander-Savino,MD  Internal Medicine Resident, PGY-1 [name]  Pager: [phone] 1:05 PM, 05/16/2023   Please contact the on call pager after 5 pm and on weekends at [phone].

## 2023-05-16 NOTE — Progress Notes (Addendum)
CSW received consult for patient. CSW spoke with patient at bedside. PTA patient reports he comes from home with Grandson.Patient gave CSW permission to reach out to financial counseling to screen patient for medicaid. Patient reports he plans to drive himself home when medically ready for dc.CSW emailed [place] in Financial counseling and requested to screen patient for medicaid. [place] CSW provided patient with information on who to contact to follow up on his immigration status and how to obtain proof of it. CSW will continue to follow.

## 2023-05-16 NOTE — Progress Notes (Signed)
Patient's systolic blood pressure is in the 160s. Bender DO was notified/ Advised to monitor unless symptomatic. Patient is asymptomatic at this time.

## 2023-05-16 NOTE — ED Triage Notes (Addendum)
Patient reports SOB with chest tightness and dry cough unrelieved by OTC antitussive onset this evening . History of CHF/CAD/COPD. His cardiologist is Dr. Peter [place].

## 2023-05-16 NOTE — Telephone Encounter (Signed)
Received voice mail message from patient stating he is currently hospitalized at [facility] hospital for breathing difficulty and will be several days before discharge.  He canceled 9/4 OV with HF clinic.

## 2023-05-16 NOTE — Telephone Encounter (Signed)
CSW unable to reach [employer] so reached out to other organizations  Was able to speak with legal team at [employer] who report they should be able to assist patient in finding out his immigration status and obtaining proof of it.    Reports pe can call at [phone] and speak with [facility] or [place] and can set up an appointment or can come to walk in [place] between 8am-4:30pm  Information sent to pt through Mychart and provided to pt in writing in the hospital.  Will continue to follow and assist as needed  Burna Sis, LCSW Clinical Social Worker [facility] Desk#: [phone] Cell#: [phone]

## 2023-05-16 NOTE — Hospital Course (Addendum)
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  - Continue nebulized triple therapy for now - As needed albuterol  Spontaneous Pneumothorax He was having NSVT runs intermittently during his hospitalization and these espisodes were for the most part asymptomatic. On 05/21/2023 he developed worsening SOB, chest palpitations, and lightheadedness. He was bradycardic and orthostatic on exam, lungs were clear to auscultation. He received .5L of LR with improvement of his symptoms. Shortly after, the nurse paged stating that he was having chest pain, diaphoretic, and SOB. PVCs on tele. Troponins were ordered, EKG not significantly changed from admission. The patient was placed on BiPAP with significant improvement on his symptoms. Later that evening, troponins were flat. He developed worsening symptoms. Lung sounds were decreased on the R and had diffused wheezing. CXR showing collapsed right lung/pneumothorax compared to previous CXR on 9/5. PCCM was consulted and a chest tube was placed. He had resolution of his symptoms. Repeat CXR showed improvement of his Pneumothorax. *** -Robaxin 1,500 mg made him dizzy and somnolent. Decrease dose to 750mg  TID.  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  Has had multiple intermittent episodes throughout his hospitalization. These have increased in frequency since yesterday. Likely in the setting of COPD. - Increase Metoprolol succinate 75 mg twice daily   Prerenal azotemia Orthostatic hypotension Slightly increased BUN/creatinine at baseline. Holding torsemide for now. He has been diuresed and Cr function was increasing (2.01 on 05/19/2023). Was given .5L NS bolus yesterday. He is not fluid overloaded on exam. Cr improved today 1.31 and at baseline. -Continue to  monitor with BMP   Chronic combined systolic and diastolic heart failure JVD and hepatojugular reflex have resolved. No BLE edema. Pt is not fluid overloaded. Has not gained weight.  [name] / [name] at discharge if renal function permitted.  -Holding home bumex as he is not fluid overloaded -metoprolol as above -Strict I/O  -Daily weights   HTN -Holding Losatan for now due to renal function. 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  -cw metoprolol as above.  -No anticoagulation per EP   CAD s/p PCI  Continue home medication regimen of Plavix 75gm, Lipitor 80mg , and Aspirin 81mg   Shoulder pain Started after his pneumococcal vaccination last month. Has not limited range of motion, but is tingling and has anesthesia over deltoid. This will sometimes radiate down his deltoid. Empty can test is negative. Tylenol does not help. Gabapentin 100mg  nightly started with improvement of his symptoms.  -Start gabapentin 100mg  nightly               Social factors affecting medical care  Unable to follow up with meds, has had two hospitalizations in the last month and a half since his wife passed away. Lives with grandson at home. Uninsured.  -appreciate TOC help with this.  9/5 Feels a lot better. Feels like the last time he was in the hospital for fluid in his lings Reports he has a dry cough Denied fever, chills  Reports he has a PCP, cardio recently cut down on his medications and he feels like the meds were working well for him and he is unsure what triggered his recent SOB and wheezing. Reports  using all his inhalers. Reports no issue getting meds bc he is able to get some meds free from drug company called [employer]. They send him meds thru the mail. He reports using spiriva and dulera but not everyday like he was supposed to. [employer] sends him nebulizers??  PE:  breathing comfortably on room air, sitting at edge of bed. +wheezing  +JVD.   9/6 Patient reports left arm numbness and pain that started after he got pneumococcal vaccine. Improves with arm exercise but returns shortly after. Has tried hot and cold compresses. Arm has a tingling sensation. Starts in shoulder and radiates down bicep.  Reports continued dry cough.  Reports breathing fine, improved with lasix. Reports that he uses his inhaler every morning and then as needed throughout the day when he feels short of breath.   PE: RUE strength 5/5 bilaterally. Tingling sensation worsened with empty can maneuver. No JVD.    Shoulder pain after vaccine  Neurological in nature  100mg  gabapentin nightly   9/7 Pt reports no improvement in cough. He reports no improvement with the mucinex and is still congested. He now reports having a headache but he reports some blurred vision. He is frustrated that he is not getting better and we informed him that his medications take time to improve his COPD exacerbation. He does not like the food here and we told him that he can have someone bring his home food if he will eat them.   9/8 he reports that he has continued cough but now that it is productive.   9/11 Breathing feels better Not having many muscle spasms

## 2023-05-16 NOTE — Plan of Care (Signed)
  Problem: Clinical Measurements: Goal: Will remain free from infection Outcome: Completed/Met

## 2023-05-16 NOTE — ED Notes (Signed)
ED TO INPATIENT HANDOFF REPORT  ED Nurse Name and Phone #: Scheryl Marten RN, [phone]  S Name/Age/Gender [name] 82 y.o. male Room/Bed: 021C/021C  Code Status   Code Status: Full Code  Home/SNF/Other Home Patient oriented to: self, place, time, and situation Is this baseline? Yes   Triage Complete: Triage complete  Chief Complaint COPD exacerbation (HCC) [J44.1]  Triage Note Patient reports SOB with chest tightness and dry cough unrelieved by OTC antitussive onset this evening . History of CHF/CAD/COPD. His cardiologist is Dr. Peter Swaziland.    Allergies Allergies  Allergen Reactions   Acyclovir And Related Other (See Comments)    Pt states he is not familiar with this medication.   Aspirin Other (See Comments)    GI upset at high doses only.    Level of Care/Admitting Diagnosis ED Disposition     ED Disposition  Admit   Condition  --   Comment  Hospital Area: [facility] [[id number]]  Level of Care: Telemetry Medical [104]  May place patient in observation at [facility] or [facility] if equivalent level of care is available:: No  Covid Evaluation: Asymptomatic - no recent exposure (last 10 days) testing not required  Diagnosis: COPD exacerbation [facility]) [[id number]]  Admitting Physician: Inez Catalina [phone]  Attending Physician: Nena Polio          B Medical/Surgery History Past Medical History:  Diagnosis Date   Acute renal failure (HCC) 05/31/2014   Angina decubitus 06/15/2014   CAD (coronary artery disease)    a. s/p stent RCA DES 9/09; b. 2014 Attempted PCI of OM1 @ [place];  c. 04/2014 Cath: LAD 40-50p, D1 95-99 (chronic), LCX 30-40 inf branch, OM1 CTO, RCA 30-40p, RCA patent stent, EF 35%->Med Rx.   Chronic combined systolic and diastolic CHF (congestive heart failure) (HCC)    a. EF about 40 to 45% per echo in April 2013;  b. 04/2014 Echo: EF 20-25%, sev LVH, sev glob HK, Gr 1 DD, mildly reduced RV fxn, PASP .  c. 01/2017: EF improved to 50-55%.    Chronic low back pain    Complete heart block (HCC)    COPD (chronic obstructive pulmonary disease) (HCC) dx 06/2013   PFTs 07/08/13: mod obst with resp to bronchodilator, moderate decrease diffusion, airtrapping   COPD with acute exacerbation (HCC) 01/17/2017   COPD with asthma    07/08/13 PFT: FEV1 1.74L (66% pred, 30% change with BD), mod obst with resp to bronchodilator, moderate decrease diffusion, air-trapping 11/2013 Simple spiro>> clear obstruction, FEV1 1.30 L (47% pred) - trial of symbicort 160 2bid 01/26/15      Depression 09/15/2018   Assessment: Increased sadness and depression since losing job  Plan: Patient denies suicidal and homicidal ideations Patient would not like to start medication therapies at this time Will establish patient with [facility] for primary care   Dyslipidemia    a. on statin   Essential hypertension    HLD (hyperlipidemia)    HTN (hypertension)    a. Reports intolerance to hydralazine; b. no beta blockers 2/2 bradycardia;  c. failed on ACE and ARB.   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 04/2014 Echo: EF 20-25%, sev glob HK.   Mixed Ischemic/Non-Ischemic Cardiomyopathy    probable mixed ischemic and non-ischemic  Noncompliance    OSA (obstructive sleep apnea) 04/18/2018   06/11/16 - home sleep study shows AHI of 2.9 an hour with the lowest SaO2 of 79% with an average of 93%  03/08/2018-Home sleep study-AHI 7/HR, SaO2 low 81%    Paroxysmal atrial fibrillation (HCC)    a. identified on device interrogation 01/2016   Pneumonia 09/02/2013   Pulmonary edema 05/10/2014   Respiratory arrest (HCC) 05/18/2014   Sinus bradycardia 05/31/2014   Ventricular tachycardia (HCC)    treated with ATP,  CL 250-300 msec   Past Surgical History:  Procedure  Laterality Date   BI-VENTRICULAR IMPLANTABLE CARDIOVERTER DEFIBRILLATOR N/A 08/12/2014   MDT Ovidio Kin XT CRTD implanted by Dr Johney Frame   BIV ICD GENERATOR CHANGEOUT N/A 08/08/2021   Procedure: BIV ICD GENERATOR CHANGEOUT;  Surgeon: Hillis Range, MD;  Location: [facility] INVASIVE CV LAB;  Service: Cardiovascular;  Laterality: N/A;   CARDIAC CATHETERIZATION     ejection fraction 50%   CORONARY STENT INTERVENTION N/A 12/10/2022   Procedure: CORONARY STENT INTERVENTION;  Surgeon: Swaziland, Peter M, MD;  Location: [facility] INVASIVE CV LAB;  Service: Cardiovascular;  Laterality: N/A;   CORONARY STENT INTERVENTION N/A 12/12/2022   Procedure: CORONARY STENT INTERVENTION;  Surgeon: Swaziland, Peter M, MD;  Location: [facility] INVASIVE CV LAB;  Service: Cardiovascular;  Laterality: N/A;   CORONARY ULTRASOUND/IVUS N/A 12/10/2022   Procedure: Intravascular Ultrasound/IVUS;  Surgeon: Swaziland, Peter M, MD;  Location: [facility] INVASIVE CV LAB;  Service: Cardiovascular;  Laterality: N/A;   CORONARY ULTRASOUND/IVUS N/A 12/12/2022   Procedure: Coronary Ultrasound/IVUS;  Surgeon: Swaziland, Peter M, MD;  Location: [facility] INVASIVE CV LAB;  Service: Cardiovascular;  Laterality: N/A;   LEFT HEART CATH AND CORONARY ANGIOGRAPHY N/A 12/10/2022   Procedure: LEFT HEART CATH AND CORONARY ANGIOGRAPHY;  Surgeon: Swaziland, Peter M, MD;  Location: [facility] INVASIVE CV LAB;  Service: Cardiovascular;  Laterality: N/A;   LEFT HEART CATHETERIZATION WITH CORONARY ANGIOGRAM N/A 05/08/2014   Procedure: LEFT HEART CATHETERIZATION WITH CORONARY ANGIOGRAM;  Surgeon: Micheline Chapman, MD;  Location: [facility] CATH LAB;  Service: Cardiovascular;  Laterality: N/A;     A IV Location/Drains/Wounds Patient Lines/Drains/Airways Status     Active Line/Drains/Airways     Name Placement date Placement time Site Days   Peripheral IV 05/16/23 20 G 1" Anterior;Proximal;Right Forearm 05/16/23  0338  Forearm  less than 1            Intake/Output Last 24 hours No intake or output data in the 24 hours  ending 05/16/23 0443  Labs/Imaging Results for orders placed or performed during the hospital encounter of 05/16/23 (from the past 48 hour(s))  Basic metabolic panel     Status: Abnormal   Collection Time: 05/16/23  1:53 AM  Result Value Ref Range   Sodium 143 135 - 145 mmol/L   Potassium 3.3 (L) 3.5 - 5.1 mmol/L   Chloride 97 (L) 98 - 111 mmol/L   CO2 27 22 - 32 mmol/L   Glucose, Bld 114 (H) 70 - 99 mg/dL    Comment: Glucose reference range applies only to samples taken after fasting for at least 8 hours.   BUN 24 (H) 8 - 23 mg/dL   Creatinine, Ser 1.61 (H) 0.61 - 1.24 mg/dL   Calcium 8.7 (L) 8.9 - 10.3 mg/dL   GFR, Estimated 39 (L) >60 mL/min    Comment: (NOTE) Calculated using the CKD-EPI Creatinine Equation (2021)    Anion gap 19 (H) 5 - 15    Comment: Performed at  [facility], [address] [place], [place] [address]  CBC     Status: Abnormal   Collection Time: 05/16/23  1:53 AM  Result Value Ref Range   WBC 6.8 4.0 - 10.5 K/uL   RBC 3.93 (L) 4.22 - 5.81 MIL/uL   Hemoglobin 10.7 (L) 13.0 - 17.0 g/dL   HCT 60.4 (L) 54.0 - 98.1 %   MCV 87.5 80.0 - 100.0 fL   MCH 27.2 26.0 - 34.0 pg   MCHC 31.1 30.0 - 36.0 g/dL   RDW 19.1 47.8 - 29.5 %   Platelets 113 (L) 150 - 400 K/uL    Comment: REPEATED TO VERIFY   nRBC 0.0 0.0 - 0.2 %    Comment: Performed at [facility], [address], [place], [place] [address]  Troponin I (High Sensitivity)     Status: Abnormal   Collection Time: 05/16/23  1:53 AM  Result Value Ref Range   Troponin I (High Sensitivity) 81 (H) <18 ng/L    Comment: (NOTE) Elevated high sensitivity troponin I (hsTnI) values and significant  changes across serial measurements may suggest ACS but many other  chronic and acute conditions are known to elevate hsTnI results.  Refer to the "Links" section for chest pain algorithms and additional  guidance. Performed at [facility], [address], [place], [place] [address]   Protime-INR  (order if Patient is taking Coumadin / Warfarin)     Status: None   Collection Time: 05/16/23  1:53 AM  Result Value Ref Range   Prothrombin Time 14.6 11.4 - 15.2 seconds   INR 1.1 0.8 - 1.2    Comment: (NOTE) INR goal varies based on device and disease states. Performed at [facility], [address] [place], [place] [address]   Brain natriuretic peptide     Status: Abnormal   Collection Time: 05/16/23  1:53 AM  Result Value Ref Range   B Natriuretic Peptide 1,329.2 (H) 0.0 - 100.0 pg/mL    Comment: Performed at [facility], [address] [place], [place] [address]  Troponin I (High Sensitivity)     Status: Abnormal   Collection Time: 05/16/23  3:50 AM  Result Value Ref Range   Troponin I (High Sensitivity) 72 (H) <18 ng/L    Comment: (NOTE) Elevated high sensitivity troponin I (hsTnI) values and significant  changes across serial measurements may suggest ACS but many other  chronic and acute conditions are known to elevate hsTnI results.  Refer to the "Links" section for chest pain algorithms and additional  guidance. Performed at [facility], [address], [place], [place] [address]    DG Chest 2 View  Result Date: 05/16/2023 CLINICAL DATA:  Shortness of breath EXAM: CHEST - 2 VIEW COMPARISON:  04/18/2023 FINDINGS: Left AICD remains in place, unchanged. Cardiomegaly, aortic atherosclerosis. No confluent opacities or effusions. Underlying COPD with hyperinflation. IMPRESSION: COPD. Cardiomegaly. No active disease. Electronically Signed   By: Charlett Nose M.D.   On: 05/16/2023 02:07    Pending Labs Unresulted Labs (From admission, onward)     Start     Ordered   05/16/23 0435  Magnesium  Add-on,   AD        05/16/23 0434            Vitals/Pain Today's Vitals   05/16/23 0150 05/16/23 0151 05/16/23 0300  BP: (!) 188/106  (!) 144/95  Pulse:   70  Resp:  16  (!) 22  Temp: 98 F (36.7 C)    SpO2: 98%  100%  PainSc:  0-No pain     Isolation  Precautions No active isolations  Medications Medications  acetaminophen (TYLENOL) tablet 650 mg (has no administration in time range)    Or  acetaminophen (TYLENOL) suppository 650 mg (has no administration in time range)  atorvastatin (LIPITOR) tablet 80 mg (has no administration in time range)  aspirin chewable tablet 81 mg (has no administration in time range)  losartan (COZAAR) tablet 50 mg (has no administration in time range)  metoprolol succinate (TOPROL-XL) 24 hr tablet 25 mg (has no administration in time range)  mometasone-formoterol (DULERA) 200-5 MCG/ACT inhaler 2 puff (has no administration in time range)  umeclidinium bromide (INCRUSE ELLIPTA) 62.5 MCG/ACT 1 puff (has no administration in time range)  clopidogrel (PLAVIX) tablet 75 mg (has no administration in time range)  enoxaparin (LOVENOX) injection 30 mg (has no administration in time range)  predniSONE (DELTASONE) tablet 40 mg (has no administration in time range)  azithromycin (ZITHROMAX) tablet 500 mg (has no administration in time range)  ipratropium-albuterol (DUONEB) 0.5-2.5 (3) MG/3ML nebulizer solution 3 mL (has no administration in time range)  ipratropium-albuterol (DUONEB) 0.5-2.5 (3) MG/3ML nebulizer solution 3 mL (3 mLs Nebulization Given 05/16/23 0310)  furosemide (LASIX) injection 40 mg (40 mg Intravenous Given 05/16/23 0338)  potassium chloride SA (KLOR-CON M) CR tablet 40 mEq (40 mEq Oral Given 05/16/23 0338)    Mobility walks     Focused Assessments Cardiac Assessment Handoff:    Lab Results  Component Value Date   CKTOTAL 991 (H) 05/24/2008   CKMB 100.4 (H) 05/24/2008   TROPONINI <0.30 06/20/2014   Lab Results  Component Value Date   DDIMER (H) 05/23/2008    0.65        AT THE INHOUSE ESTABLISHED CUTOFF VALUE OF 0.48 ug/mL FEU, THIS ASSAY HAS BEEN DOCUMENTED IN THE LITERATURE TO HAVE   Does the Patient currently have chest pain? No   , Pulmonary Assessment Handoff:  Lung sounds:    O2 Device: Room Air      R Recommendations: See Admitting Provider Note  Report given to:   Additional Notes: n/a

## 2023-05-17 ENCOUNTER — Encounter: Payer: Self-pay | Admitting: Pharmacist

## 2023-05-17 DIAGNOSIS — I472 Ventricular tachycardia, unspecified: Secondary | ICD-10-CM | POA: Diagnosis not present

## 2023-05-17 DIAGNOSIS — I428 Other cardiomyopathies: Secondary | ICD-10-CM | POA: Diagnosis present

## 2023-05-17 DIAGNOSIS — J9382 Other air leak: Secondary | ICD-10-CM | POA: Diagnosis not present

## 2023-05-17 DIAGNOSIS — F32A Depression, unspecified: Secondary | ICD-10-CM | POA: Diagnosis present

## 2023-05-17 DIAGNOSIS — Z7982 Long term (current) use of aspirin: Secondary | ICD-10-CM | POA: Diagnosis not present

## 2023-05-17 DIAGNOSIS — I2510 Atherosclerotic heart disease of native coronary artery without angina pectoris: Secondary | ICD-10-CM | POA: Diagnosis present

## 2023-05-17 DIAGNOSIS — I5022 Chronic systolic (congestive) heart failure: Secondary | ICD-10-CM | POA: Diagnosis present

## 2023-05-17 DIAGNOSIS — Z95810 Presence of automatic (implantable) cardiac defibrillator: Secondary | ICD-10-CM | POA: Diagnosis not present

## 2023-05-17 DIAGNOSIS — Z7902 Long term (current) use of antithrombotics/antiplatelets: Secondary | ICD-10-CM | POA: Diagnosis not present

## 2023-05-17 DIAGNOSIS — Z79899 Other long term (current) drug therapy: Secondary | ICD-10-CM | POA: Diagnosis not present

## 2023-05-17 DIAGNOSIS — I951 Orthostatic hypotension: Secondary | ICD-10-CM | POA: Diagnosis not present

## 2023-05-17 DIAGNOSIS — M25512 Pain in left shoulder: Secondary | ICD-10-CM | POA: Diagnosis present

## 2023-05-17 DIAGNOSIS — E785 Hyperlipidemia, unspecified: Secondary | ICD-10-CM | POA: Diagnosis present

## 2023-05-17 DIAGNOSIS — Z7951 Long term (current) use of inhaled steroids: Secondary | ICD-10-CM | POA: Diagnosis not present

## 2023-05-17 DIAGNOSIS — F17210 Nicotine dependence, cigarettes, uncomplicated: Secondary | ICD-10-CM | POA: Diagnosis present

## 2023-05-17 DIAGNOSIS — Z955 Presence of coronary angioplasty implant and graft: Secondary | ICD-10-CM | POA: Diagnosis not present

## 2023-05-17 DIAGNOSIS — I255 Ischemic cardiomyopathy: Secondary | ICD-10-CM | POA: Diagnosis present

## 2023-05-17 DIAGNOSIS — J441 Chronic obstructive pulmonary disease with (acute) exacerbation: Secondary | ICD-10-CM | POA: Diagnosis present

## 2023-05-17 DIAGNOSIS — Z888 Allergy status to other drugs, medicaments and biological substances status: Secondary | ICD-10-CM | POA: Diagnosis not present

## 2023-05-17 DIAGNOSIS — I130 Hypertensive heart and chronic kidney disease with heart failure and stage 1 through stage 4 chronic kidney disease, or unspecified chronic kidney disease: Secondary | ICD-10-CM | POA: Diagnosis present

## 2023-05-17 DIAGNOSIS — I4819 Other persistent atrial fibrillation: Secondary | ICD-10-CM | POA: Diagnosis present

## 2023-05-17 DIAGNOSIS — N1831 Chronic kidney disease, stage 3a: Secondary | ICD-10-CM | POA: Diagnosis present

## 2023-05-17 DIAGNOSIS — J439 Emphysema, unspecified: Secondary | ICD-10-CM | POA: Diagnosis present

## 2023-05-17 DIAGNOSIS — R0602 Shortness of breath: Secondary | ICD-10-CM | POA: Diagnosis present

## 2023-05-17 DIAGNOSIS — J9621 Acute and chronic respiratory failure with hypoxia: Secondary | ICD-10-CM | POA: Diagnosis not present

## 2023-05-17 LAB — RENAL FUNCTION PANEL
Albumin: 4 g/dL (ref 3.5–5.0)
Anion gap: 9 (ref 5–15)
BUN: 28 mg/dL — ABNORMAL HIGH (ref 8–23)
CO2: 26 mmol/L (ref 22–32)
Calcium: 8.7 mg/dL — ABNORMAL LOW (ref 8.9–10.3)
Chloride: 102 mmol/L (ref 98–111)
Creatinine, Ser: 1.67 mg/dL — ABNORMAL HIGH (ref 0.61–1.24)
GFR, Estimated: 41 mL/min — ABNORMAL LOW (ref >60)
Glucose, Bld: 121 mg/dL — ABNORMAL HIGH (ref 70–99)
Phosphorus: 3.4 mg/dL (ref 2.5–4.6)
Potassium: 3.2 mmol/L — ABNORMAL LOW (ref 3.5–5.1)
Sodium: 137 mmol/L (ref 135–145)

## 2023-05-17 LAB — MAGNESIUM: Magnesium: 2.1 mg/dL (ref 1.7–2.4)

## 2023-05-17 MED ORDER — GABAPENTIN 100 MG PO CAPS
100.0000 mg | ORAL_CAPSULE | Freq: Every evening | ORAL | Status: DC | PRN
  Administered 2023-05-18 – 2023-05-21 (×2): 100 mg via ORAL
  Filled 2023-05-17 (×2): qty 1

## 2023-05-17 MED ORDER — HYDROCOD POLI-CHLORPHE POLI ER 10-8 MG/5ML PO SUER
5.0000 mL | Freq: Two times a day (BID) | ORAL | Status: DC | PRN
  Administered 2023-05-17: 5 mL via ORAL
  Filled 2023-05-17 (×2): qty 5

## 2023-05-17 MED ORDER — METOPROLOL SUCCINATE ER 25 MG PO TB24
25.0000 mg | ORAL_TABLET | Freq: Every day | ORAL | Status: DC
  Administered 2023-05-17 – 2023-05-18 (×2): 25 mg via ORAL
  Filled 2023-05-17 (×2): qty 1

## 2023-05-17 MED ORDER — IPRATROPIUM-ALBUTEROL 0.5-2.5 (3) MG/3ML IN SOLN: 3.0000 mL | Freq: Four times a day (QID) | RESPIRATORY_TRACT | Status: DC | PRN

## 2023-05-17 MED ORDER — GUAIFENESIN ER 600 MG PO TB12: 600.0000 mg | ORAL_TABLET | Freq: Two times a day (BID) | ORAL | Status: DC

## 2023-05-17 MED ORDER — TORSEMIDE 20 MG PO TABS
10.0000 mg | ORAL_TABLET | Freq: Every day | ORAL | Status: DC
  Administered 2023-05-17 – 2023-05-19 (×3): 10 mg via ORAL
  Filled 2023-05-17 (×3): qty 1

## 2023-05-17 MED ORDER — POTASSIUM CHLORIDE CRYS ER 20 MEQ PO TBCR
40.0000 meq | EXTENDED_RELEASE_TABLET | Freq: Once | ORAL | Status: AC
  Administered 2023-05-17: 40 meq via ORAL
  Filled 2023-05-17: qty 2

## 2023-05-17 MED ORDER — HYDRALAZINE HCL 50 MG PO TABS
50.0000 mg | ORAL_TABLET | Freq: Three times a day (TID) | ORAL | Status: DC
  Administered 2023-05-17 – 2023-05-23 (×17): 50 mg via ORAL
  Filled 2023-05-17 (×19): qty 1

## 2023-05-17 NOTE — Progress Notes (Signed)
HD#1 SUBJECTIVE:   Interim History:  Slept well overnight but patient is very concerned about shoulder pain that started about a month ago after his pneumonia vaccine. Since, he has been feeling anesthesia over the left shoulder and sometimes radiating shooting tingling pain down his shoulder. Has not affected strength but sensation is. Tylenol does not help. He is upset that this is happening and wants it addressed.   He also has had some cough overnight.  Rubitussin did not help.   OBJECTIVE:  Vital Signs: Vitals:   05/17/23 0340 05/17/23 0553 05/17/23 0807 05/17/23 0828  BP: (!) 154/71 (!) 168/91 (!) 151/83   Pulse: 70     Resp: 18  16   Temp: 98.3 F (36.8 C)     TempSrc: Oral     SpO2: 95%   96%  Weight: 60.9 kg     Height:       Supplemental O2: Room Air SpO2: 96 %  Filed Weights   05/16/23 0540 05/17/23 0340  Weight: 63.6 kg 60.9 kg     Intake/Output Summary (Last 24 hours) at 05/17/2023 1136 Last data filed at 05/17/2023 0300 Gross per 24 hour  Intake 360 ml  Output 1670 ml  Net -1310 ml   Net IO Since Admission: -2,399.36 mL [05/17/23 1136]  Physical Exam:  Alert, resting comfortably in bed. Non-toxic appearing.  Loud wheezing throughout all lung lobes. No crackles. No accessory muscle use, mild tachypnea.  No mrg normal S1, S2  No JVD Hepatojugular reflex absent today. no edema in bilateral lower extremities Bowel sound present, non-tender to palpation. Non-distended.  Shoulder is non tender to palpation. Anesthesia over deltoid region. Negative empty can test.  Patient Lines/Drains/Airways Status     Active Line/Drains/Airways     Name Placement date Placement time Site Days   Peripheral IV 05/16/23 20 G 1" Anterior;Proximal;Right Forearm 05/16/23  0338  Forearm  less than 1             ASSESSMENT/PLAN:  Assessment: Principal Problem:   COPD exacerbation (HCC)   Plan:  [name] is a 82 y.o. with a history of COPD, ischemic  vascular disease, CAD s/p PCI, HFmrEF s/p CRT-D, paroxysmal Afib, and HTN who presented with shortness of breath and admitted for COPD exacerbation on hospital day 1.   COPD Exacerbation On further interrogation [name] states that he was using his Bevespi more than twice a day (was using it for rescue) with no signs of relief. He stopped using the albuterol because he was worried about the medications he was given and the side effects. He does have duoneb at home. He continues to smoke about three cigarettes a day which is also likely contributing to the frequent exacerbations as well.  --cw azithromycin 500 mg daily -cw prednisone 40mg  daily -cw Duonebs q4 prn -cw incruse ellipta, 1 puff daily  -cw Dulera 2 puffs BID -Smoke cessation counseling -Considering Bevespi at discharge given that it is unclear if it has failed. Albuterol for rescue.   HFmrEF s/p CRT-D Prior Echocardiogram April 2024 LVEF 35-40% with moderate LVH JVD and hepatojugular reflex have resolved. [name] / [name] if renal function permitted. He is also uninsured at the moment and needs financial assistance with meds.  -restart home bumex -cw metoprolol succinate 25mg  daily  -Strict I/O  -Daily weight   CKD Stage 3a HTN He presents with a Scr of 1.76, Scr baseline of ~1.5 (varies from 1.3-2). Serum Creatinine is slightly elevated from  baseline, not quite in the AKI range yet. It appears that he was started on Losartan on 8/19, elevation in serum creatinine is possibly due to beginning Losartan. Plan: -Hold Losartan for now  -Avoid nephrotoxic agents    HTN -Will hold Losatan due to elevation in serum creatinine. 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 Has not limited range of motion, but is tingling and has anesthesia over deltoid. This will sometimes radiate down his deltoid. Empty can test is negative. Tylenol does not help.  -Start gabapentin 100mg  nightly   Social factors affecting medical care  Unable to follow up with meds, has had two hospitalizations in the last month and a half since his wife passed away. Lives with grandson at home. Uninsured.  -appreciate TOC help with this.  Diet: Heart Healthy VTE: Enoxaparin IVF: None,None Code: Full  Signature: [name],MD  Internal Medicine Resident, PGY-1 [name]  Pager: [phone] 11:36 AM, 05/17/2023   Please contact the on call pager after 5 pm and on weekends at [phone].

## 2023-05-17 NOTE — Plan of Care (Signed)

## 2023-05-17 NOTE — Plan of Care (Signed)
  Problem: Clinical Measurements: Goal: Diagnostic test results will improve Outcome: Progressing Goal: Respiratory complications will improve Outcome: Progressing Goal: Cardiovascular complication will be avoided Outcome: Progressing   

## 2023-05-18 DIAGNOSIS — I110 Hypertensive heart disease with heart failure: Secondary | ICD-10-CM

## 2023-05-18 LAB — BASIC METABOLIC PANEL
Anion gap: 11 (ref 5–15)
BUN: 46 mg/dL — ABNORMAL HIGH (ref 8–23)
CO2: 25 mmol/L (ref 22–32)
Calcium: 9.3 mg/dL (ref 8.9–10.3)
Chloride: 97 mmol/L — ABNORMAL LOW (ref 98–111)
Creatinine, Ser: 1.89 mg/dL — ABNORMAL HIGH (ref 0.61–1.24)
GFR, Estimated: 36 mL/min — ABNORMAL LOW (ref >60)
Glucose, Bld: 103 mg/dL — ABNORMAL HIGH (ref 70–99)
Potassium: 4.1 mmol/L (ref 3.5–5.1)
Sodium: 133 mmol/L — ABNORMAL LOW (ref 135–145)

## 2023-05-18 LAB — CBC
HCT: 35.3 % — ABNORMAL LOW (ref 39.0–52.0)
Hemoglobin: 11.1 g/dL — ABNORMAL LOW (ref 13.0–17.0)
MCH: 26.9 pg (ref 26.0–34.0)
MCHC: 31.4 g/dL (ref 30.0–36.0)
MCV: 85.5 fL (ref 80.0–100.0)
Platelets: 147 10*3/uL — ABNORMAL LOW (ref 150–400)
RBC: 4.13 MIL/uL — ABNORMAL LOW (ref 4.22–5.81)
RDW: 15 % (ref 11.5–15.5)
WBC: 8.2 10*3/uL (ref 4.0–10.5)
nRBC: 0 % (ref 0.0–0.2)

## 2023-05-18 MED ORDER — ALBUTEROL SULFATE (2.5 MG/3ML) 0.083% IN NEBU
2.5000 mg | INHALATION_SOLUTION | RESPIRATORY_TRACT | Status: DC | PRN
  Administered 2023-05-18 – 2023-05-20 (×3): 2.5 mg via RESPIRATORY_TRACT
  Filled 2023-05-18 (×4): qty 3

## 2023-05-18 MED ORDER — ARFORMOTEROL TARTRATE 15 MCG/2ML IN NEBU
15.0000 ug | INHALATION_SOLUTION | Freq: Two times a day (BID) | RESPIRATORY_TRACT | Status: DC
  Administered 2023-05-18 – 2023-05-23 (×10): 15 ug via RESPIRATORY_TRACT
  Filled 2023-05-18 (×10): qty 2

## 2023-05-18 MED ORDER — METOPROLOL SUCCINATE ER 50 MG PO TB24
50.0000 mg | ORAL_TABLET | Freq: Every day | ORAL | Status: DC
  Administered 2023-05-19: 50 mg via ORAL
  Filled 2023-05-18: qty 1

## 2023-05-18 MED ORDER — BUDESONIDE 0.25 MG/2ML IN SUSP
0.2500 mg | Freq: Two times a day (BID) | RESPIRATORY_TRACT | Status: DC
  Administered 2023-05-19 – 2023-05-23 (×9): 0.25 mg via RESPIRATORY_TRACT
  Filled 2023-05-18 (×10): qty 2

## 2023-05-18 MED ORDER — POLYVINYL ALCOHOL 1.4 % OP SOLN
1.0000 [drp] | OPHTHALMIC | Status: DC | PRN
  Filled 2023-05-18: qty 15

## 2023-05-18 MED ORDER — HYDROCOD POLI-CHLORPHE POLI ER 10-8 MG/5ML PO SUER
5.0000 mL | Freq: Two times a day (BID) | ORAL | Status: DC
  Administered 2023-05-18 – 2023-05-22 (×8): 5 mL via ORAL
  Filled 2023-05-18 (×10): qty 5

## 2023-05-18 MED ORDER — REVEFENACIN 175 MCG/3ML IN SOLN
175.0000 ug | Freq: Every day | RESPIRATORY_TRACT | Status: DC
  Administered 2023-05-19 – 2023-05-23 (×5): 175 ug via RESPIRATORY_TRACT
  Filled 2023-05-18 (×5): qty 3

## 2023-05-18 NOTE — Plan of Care (Signed)
  Problem: Clinical Measurements: Goal: Ability to maintain clinical measurements within normal limits will improve Outcome: Progressing Goal: Diagnostic test results will improve Outcome: Progressing Goal: Respiratory complications will improve Outcome: Progressing Goal: Cardiovascular complication will be avoided Outcome: Progressing   

## 2023-05-18 NOTE — Progress Notes (Signed)
SUBJECTIVE:   No acute events ON. Cough remains the same. He is hungry and would like to eat home foods. Wants to ambulate, feels he is getting weaker.   OBJECTIVE:  Vital Signs: Vitals:   05/18/23 0813 05/18/23 0855 05/18/23 0939 05/18/23 1219  BP: (!) 141/80   (!) 140/68  Pulse: 64  69 65  Resp: 18   17  Temp: 98.5 F (36.9 C)   98 F (36.7 C)  TempSrc: Oral   Oral  SpO2: 99% 98%  98%  Weight:      Height:       Supplemental O2: Room Air SpO2: 98 %  Filed Weights   05/16/23 0540 05/17/23 0340 05/18/23 0427  Weight: 63.6 kg 60.9 kg 61.2 kg    Intake/Output Summary (Last 24 hours) at 05/18/2023 1532 Last data filed at 05/18/2023 1300 Gross per 24 hour  Intake 480 ml  Output --  Net 480 ml   Net IO Since Admission: -1,919.36 mL [05/18/23 1532]  Physical Exam: Physical Exam Constitutional:      General: He is in acute distress (coughing).     Appearance: He is not toxic-appearing.     Interventions: He is not intubated. Neck:     Vascular: No hepatojugular reflux or JVD.  Cardiovascular:     Rate and Rhythm: Normal rate and regular rhythm.  Pulmonary:     Effort: No tachypnea or accessory muscle usage. He is not intubated.     Breath sounds: Examination of the right-upper field reveals wheezing. Examination of the left-upper field reveals wheezing. Examination of the right-middle field reveals wheezing. Examination of the left-middle field reveals wheezing. Examination of the right-lower field reveals wheezing. Examination of the left-lower field reveals wheezing. Wheezing present. No rhonchi or rales.  Abdominal:     Palpations: There is no mass.     Tenderness: There is no abdominal tenderness. There is no guarding.  Musculoskeletal:     Right lower leg: No edema.     Left lower leg: No edema.  Neurological:     Mental Status: He is alert.    Patient Lines/Drains/Airways Status     Active Line/Drains/Airways     Name Placement date Placement time Site  Days   Peripheral IV 05/16/23 20 G 1" Anterior;Proximal;Right Forearm 05/16/23  0338  Forearm  2             ASSESSMENT/PLAN:  Assessment: Principal Problem:   COPD exacerbation (HCC) Active Problems:   COPD with acute exacerbation (HCC)   Plan:  [name] is a 82 y.o. with a history of COPD, ischemic vascular disease, CAD s/p PCI, HFmrEF s/p CRT-D, paroxysmal Afib, and HTN who presented with shortness of breath and admitted for COPD exacerbation    COPD Exacerbation Continues to cough and feeling chest tightness. Not expectorating. Switching to nebulizations as this may help him if not breathing deep enough with his inhalers.  -cw azithromycin 500 mg daily -cw prednisone 40mg  daily -revefenacin nebs daily -budesonide nebs BID -brovana BID -Tussinex BID -Smoke cessation counseling -Considering Bevespi at discharge given that it is unclear if it has failed.    HFmrEF s/p CRT-D Prior Echocardiogram April 2024 LVEF 35-40% with moderate LVH JVD and hepatojugular reflex have resolved. [name] / [name] if renal function permitted. He is also uninsured at the moment and needs financial assistance with meds.  -restart home bumex -Increase metoprolol 50mg  BID -Strict I/O  -Daily weight    CKD Stage 3a HTN  He presents with a Scr of 1.76, Scr baseline of ~1.5 (varies from 1.3-2). Serum Creatinine is slightly elevated from baseline, not quite in the AKI range yet. It appears that he was started on Losartan on 8/19, elevation in serum creatinine is possibly due to beginning Losartan. Plan: -Hold Losartan for now  -Avoid nephrotoxic agents   HTN -Will hold Losatan due to elevation in serum creatinine. -Hydralazine 50 mg TID (has remained hypertensive on hydralazine 25mg ) complaining of visual changes with his cough -Continue Metoprolol succinate 25mg  daily -Continue to monitor BP and symptoms     CAD s/p PCI  Continue home medication regimen of Plavix 75gm, Lipitor  80mg , and Aspirin 81mg    Paroxsymal Atrial Fibrillation has defibrillator in place.  Has had several runs of Vtach caught on telemetry. Pacemaker interrogated. Lead parameters and battery status is table. Since his Vtach episodes are >1 min.  -increase metoprolol succinate to 50mg  daily -No anticoagulation per EP   Shoulder pain Started after his pneumococcal vaccination last month. Has not limited range of motion, but is tingling and has anesthesia over deltoid. This will sometimes radiate down his deltoid. Empty can test is negative. Tylenol does not help.  -Start gabapentin 100mg  nightly -MRI unable to be done today due to ICD and it being the weekend.    Social factors affecting medical care  Unable to follow up with meds, has had two hospitalizations in the last month and a half since his wife passed away. Lives with grandson at home. Uninsured.  -appreciate TOC help with this.   Diet: Heart Healthy VTE: Enoxaparin IVF: None,None Code: Full   Signature: Vincent Caratachea Alexander-Savino,MD  Internal Medicine Resident, PGY-1 [name]  Pager: [phone] 3:32 PM, 05/18/2023   Please contact the on call pager after 5 pm and on weekends at [phone].

## 2023-05-18 NOTE — Progress Notes (Signed)
Patient has stated he wants the hydralazine pill taken off his MAR, patient states this is a water pill and everytime he takes it,he goes to the bathroom 16 times to urinate, and he is not going to take it anymore, education x2 has been provided, patient still refuses, Dr Garey Ham has been advised

## 2023-05-19 LAB — BASIC METABOLIC PANEL
Anion gap: 17 — ABNORMAL HIGH (ref 5–15)
BUN: 58 mg/dL — ABNORMAL HIGH (ref 8–23)
CO2: 24 mmol/L (ref 22–32)
Calcium: 9 mg/dL (ref 8.9–10.3)
Chloride: 96 mmol/L — ABNORMAL LOW (ref 98–111)
Creatinine, Ser: 2.01 mg/dL — ABNORMAL HIGH (ref 0.61–1.24)
GFR, Estimated: 33 mL/min — ABNORMAL LOW (ref >60)
Glucose, Bld: 114 mg/dL — ABNORMAL HIGH (ref 70–99)
Potassium: 4 mmol/L (ref 3.5–5.1)
Sodium: 137 mmol/L (ref 135–145)

## 2023-05-19 LAB — MAGNESIUM: Magnesium: 2.3 mg/dL (ref 1.7–2.4)

## 2023-05-19 MED ORDER — METOPROLOL SUCCINATE ER 50 MG PO TB24
50.0000 mg | ORAL_TABLET | Freq: Two times a day (BID) | ORAL | Status: DC
  Administered 2023-05-19 – 2023-05-21 (×4): 50 mg via ORAL
  Filled 2023-05-19 (×5): qty 1

## 2023-05-19 NOTE — Progress Notes (Addendum)
                 Interval history Feeling much improved since yesterday, really happy that his son could bring him some food to eat.  Breathing feels okay.  Cough is still bothersome but stable.  Physical exam Blood pressure 136/71, pulse 71, temperature 98.3 F (36.8 C), temperature source Oral, resp. rate 20, height 5\' 5"  (1.651 m), weight 63.1 kg, SpO2 97%.  No distress Heart rates normal, radial pulses strong, some external jugular distention Breathing is regular and unlabored, scant wheezing throughout Skin is warm and dry Alert and oriented  Labs, images, and other studies Electrolytes stable Rising BUN/creatinine CBC stable  Assessment and plan Hospital day 2  [name] is a 82 y.o. admitted for COPD exacerbation, complicated by chronic congestive heart failure and nonsustained ventricular tachycardia  Principal Problem:   COPD exacerbation (HCC) Active Problems:   NSVT (nonsustained ventricular tachycardia) (HCC)   Chronic combined systolic and diastolic heart failure (HCC)   Persistent atrial fibrillation (HCC)   Acute hypoxic respiratory failure (HCC)  COPD with exacerbation Acute on chronic hypoxic respiratory failure This problem is essentially resolved.  He will continue Bevespi inhaler + albuterol prn when he leaves the hospital. - Continue nebulized triple therapy for now - As needed albuterol - Prednisone through 05/20/2023  NSVT History of same.  ICD in place.  Had a 40-50 beat run today while he was sleeping.  Apparently asymptomatic.  Consulted cardiology over the phone, will increase to twice daily metoprolol for now and talk to EP tomorrow about this.  Can do amiodarone as second line if metoprolol fails overnight. - Metoprolol succinate 50 mg twice daily  Prerenal azotemia Slightly increased BUN/creatinine.  Will DC torsemide for tomorrow.  Chronic combined systolic and diastolic heart failure Well compensated, not contributing to respiratory  failure at present.  Going to stop diuretics for a day because of elevating BUN/creatinine. - Hydralazine 50 mg 3 times daily - Increasing metoprolol per above  Stable conditions: History of ACS status post revascularization-DAPT and statin Paroxysmal A-fib-continue metoprolol, no anticoagulation per electrophysiology  Diet: Regular IVF: N/A VTE: enoxaparin (LOVENOX) injection 30 mg Start: 05/16/23 2200  Code: Full  Discharge plan: Pending EP evaluation for NSVT.  Marrianne Mood MD 05/19/2023, 7:08 PM  Pager: [phone] After 5pm or weekend: [phone]

## 2023-05-19 NOTE — Plan of Care (Signed)
  Problem: Clinical Measurements: Goal: Ability to maintain clinical measurements within normal limits will improve Outcome: Progressing Goal: Diagnostic test results will improve Outcome: Progressing Goal: Respiratory complications will improve Outcome: Progressing Goal: Cardiovascular complication will be avoided Outcome: Progressing   

## 2023-05-19 NOTE — Consult Note (Signed)
Brief Cardiology Review  Called by medicine team regarding [name] with CAD, HFrEF, hx VT w/ ICD, and pAF who is currently admitted with a severe COPD exacerbation. Follows with Dr Elberta Fortis of EP. Patient was having runs of NSVT in the hospital, but this evening had a 50 beat run. Primary team planning on consulting EP in the am, but asking for any interim recommendations for overnight. In chart review, the patient was historically on amiodarone earlier this year with reduction in his VT burden, but was stopped following revascularization. Overnight, recommended could increase metoprolol succinate 50mg  from every day to BID, and if has continued VT overnight on higher dose metop, could do bridge amio load until EP can provide their input.   Achille Rich, MD

## 2023-05-19 NOTE — Evaluation (Signed)
Physical Therapy Evaluation and D/C Patient Details Name: [name] MRN: [id number] DOB: 05/18/1941 Today's Date: 05/19/2023  History of Present Illness  [name] is a 82 y.o. admitted 9/5 for COPD exacerbation.  PMH: COPD, ischemic vascular disease, CAD s/p PCI, HFmrEF s/p CRT-D, paroxysmal Afib, and HTN  Clinical Impression  Pt admitted with above diagnosis. Pt was able to ambulate without device with no LOB.  Pt has not current deficits with O2 saturation 93% on RA. Pt asking about returning to work and messaged MD relaying that pt requesting note to return to work from MD.  Will not follow as pt doesn't have skilled PT needs at this time. Have asked mobility specialist to ambulate with pt while in hospital as well as nursing.  Will sign off.     If plan is discharge home, recommend the following:     Can travel by private vehicle        Equipment Recommendations None recommended by PT  Recommendations for Other Services       Functional Status Assessment Patient has not had a recent decline in their functional status     Precautions / Restrictions Precautions Precautions: Fall Restrictions Weight Bearing Restrictions: No      Mobility  Bed Mobility Overal bed mobility: Independent                  Transfers Overall transfer level: Independent                      Ambulation/Gait Ambulation/Gait assistance: Independent Gait Distance (Feet): 450 Feet Assistive device: None         General Gait Details: No LOB with challenges to balance.  Stairs            Wheelchair Mobility     Tilt Bed    Modified Rankin (Stroke Patients Only)       Balance                                 Standardized Balance Assessment Standardized Balance Assessment : Dynamic Gait Index   Dynamic Gait Index Level Surface: Normal Change in Gait Speed: Normal Gait with Horizontal Head Turns: Normal Gait with Vertical Head Turns:  Normal Gait and Pivot Turn: Normal Step Over Obstacle: Normal Step Around Obstacles: Normal Steps: Mild Impairment Total Score: 23       Pertinent Vitals/Pain Pain Assessment Pain Assessment: No/denies pain    Home Living Family/patient expects to be discharged to:: Private residence Living Arrangements: Alone;Other relatives (grandson works) Available Help at Discharge: Family;Available PRN/intermittently Type of Home: House Home Access: Stairs to enter;Ramped entrance Entrance Stairs-Rails: Right Entrance Stairs-Number of Steps: 3   Home Layout: One level Home Equipment: Cane - quad;Cane - single Librarian, academic (2 wheels);Crutches;BSC/3in1;Shower seat Additional Comments: 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    Prior Function Prior Level of Function : Driving;Working/employed;Independent/Modified Independent                     Extremity/Trunk Assessment   Upper Extremity Assessment Upper Extremity Assessment: Defer to OT evaluation    Lower Extremity Assessment Lower Extremity Assessment: Generalized weakness    Cervical / Trunk Assessment Cervical / Trunk Assessment: Normal  Communication   Communication Communication: No apparent difficulties  Cognition Arousal: Alert Behavior During Therapy: WFL for tasks assessed/performed Overall Cognitive Status: Within Functional Limits for tasks  assessed                                          General Comments General comments (skin integrity, edema, etc.): 80-103 bpm, 93% RA    Exercises     Assessment/Plan    PT Assessment Patient does not need any further PT services  PT Problem List         PT Treatment Interventions      PT Goals (Current goals can be found in the Care Plan section)  Acute Rehab PT Goals Patient Stated Goal: to go home PT Goal Formulation: All assessment and education complete, DC therapy    Frequency       Co-evaluation                AM-PAC PT "6 Clicks" Mobility  Outcome Measure Help needed turning from your back to your side while in a flat bed without using bedrails?: None Help needed moving from lying on your back to sitting on the side of a flat bed without using bedrails?: None Help needed moving to and from a bed to a chair (including a wheelchair)?: None Help needed standing up from a chair using your arms (e.g., wheelchair or bedside chair)?: None Help needed to walk in hospital room?: None Help needed climbing 3-5 steps with a railing? : None 6 Click Score: 24    End of Session Equipment Utilized During Treatment: Gait belt Activity Tolerance: Patient tolerated treatment well Patient left: in bed;with call bell/phone within reach Nurse Communication: Mobility status PT Visit Diagnosis: Muscle weakness (generalized) (M62.81)    Time: 2130-8657 PT Time Calculation (min) (ACUTE ONLY): 20 min   Charges:   PT Evaluation $PT Eval Low Complexity: 1 Low   PT General Charges $$ ACUTE PT VISIT: 1 Visit         Horice Carrero M,PT Acute Rehab Services [phone]   Bevelyn Buckles 05/19/2023, 3:18 PM

## 2023-05-19 NOTE — Plan of Care (Signed)
Patient had 42 beats of VTACH. MD was paged and notified that the patient has not had a cardiology consult. He cancelled his appointment on 05/15/23. From previous notes the patient is not compliant. Patient has been upset and insisting on leaving today. Will continue to monitor patient

## 2023-05-20 ENCOUNTER — Inpatient Hospital Stay (HOSPITAL_COMMUNITY): Payer: Medicare Other

## 2023-05-20 ENCOUNTER — Telehealth: Payer: Self-pay | Admitting: Nurse Practitioner

## 2023-05-20 ENCOUNTER — Other Ambulatory Visit (HOSPITAL_COMMUNITY): Payer: Self-pay

## 2023-05-20 DIAGNOSIS — Z4682 Encounter for fitting and adjustment of non-vascular catheter: Secondary | ICD-10-CM

## 2023-05-20 DIAGNOSIS — J9312 Secondary spontaneous pneumothorax: Secondary | ICD-10-CM

## 2023-05-20 DIAGNOSIS — J9621 Acute and chronic respiratory failure with hypoxia: Secondary | ICD-10-CM

## 2023-05-20 DIAGNOSIS — J939 Pneumothorax, unspecified: Secondary | ICD-10-CM

## 2023-05-20 LAB — CBC
HCT: 36.4 % — ABNORMAL LOW (ref 39.0–52.0)
Hemoglobin: 11.5 g/dL — ABNORMAL LOW (ref 13.0–17.0)
MCH: 27 pg (ref 26.0–34.0)
MCHC: 31.6 g/dL (ref 30.0–36.0)
MCV: 85.4 fL (ref 80.0–100.0)
Platelets: 165 10*3/uL (ref 150–400)
RBC: 4.26 MIL/uL (ref 4.22–5.81)
RDW: 15.3 % (ref 11.5–15.5)
WBC: 10.4 10*3/uL (ref 4.0–10.5)
nRBC: 0 % (ref 0.0–0.2)

## 2023-05-20 LAB — RENAL FUNCTION PANEL
Albumin: 3.8 g/dL (ref 3.5–5.0)
Anion gap: 12 (ref 5–15)
BUN: 64 mg/dL — ABNORMAL HIGH (ref 8–23)
CO2: 26 mmol/L (ref 22–32)
Calcium: 8.7 mg/dL — ABNORMAL LOW (ref 8.9–10.3)
Chloride: 98 mmol/L (ref 98–111)
Creatinine, Ser: 1.85 mg/dL — ABNORMAL HIGH (ref 0.61–1.24)
GFR, Estimated: 37 mL/min — ABNORMAL LOW (ref >60)
Glucose, Bld: 115 mg/dL — ABNORMAL HIGH (ref 70–99)
Phosphorus: 4.4 mg/dL (ref 2.5–4.6)
Potassium: 4 mmol/L (ref 3.5–5.1)
Sodium: 136 mmol/L (ref 135–145)

## 2023-05-20 LAB — TROPONIN I (HIGH SENSITIVITY)
Troponin I (High Sensitivity): 40 ng/L — ABNORMAL HIGH (ref <18)
Troponin I (High Sensitivity): 48 ng/L — ABNORMAL HIGH (ref <18)

## 2023-05-20 LAB — MAGNESIUM: Magnesium: 2.3 mg/dL (ref 1.7–2.4)

## 2023-05-20 MED ORDER — ENSURE ENLIVE PO LIQD
237.0000 mL | Freq: Two times a day (BID) | ORAL | 12 refills | Status: DC
  Filled 2023-05-20: qty 237, 1d supply, fill #0

## 2023-05-20 MED ORDER — ASPIRIN 81 MG PO CHEW
81.0000 mg | CHEWABLE_TABLET | Freq: Every day | ORAL | 0 refills | Status: DC
  Filled 2023-05-20: qty 90, 90d supply, fill #0
  Filled 2023-05-24: qty 30, 30d supply, fill #0

## 2023-05-20 MED ORDER — MUCUS RELIEF DM 30-600 MG PO TB12
1.0000 | ORAL_TABLET | Freq: Two times a day (BID) | ORAL | 0 refills | Status: DC
  Filled 2023-05-20: qty 70, 7d supply, fill #0
  Filled 2023-05-20: qty 20, 10d supply, fill #0

## 2023-05-20 MED ORDER — SODIUM CHLORIDE 0.9% FLUSH
10.0000 mL | Freq: Three times a day (TID) | INTRAVENOUS | Status: DC
  Administered 2023-05-20 – 2023-05-23 (×8): 10 mL via INTRAPLEURAL

## 2023-05-20 MED ORDER — HYDROCORTISONE 1 % EX CREA
TOPICAL_CREAM | Freq: Four times a day (QID) | CUTANEOUS | Status: DC | PRN
  Filled 2023-05-20: qty 28

## 2023-05-20 MED ORDER — LIDOCAINE HCL (CARDIAC) PF 100 MG/5ML IV SOSY
PREFILLED_SYRINGE | INTRAVENOUS | Status: AC
  Filled 2023-05-20: qty 5

## 2023-05-20 MED ORDER — ALBUTEROL SULFATE (2.5 MG/3ML) 0.083% IN NEBU
2.5000 mg | INHALATION_SOLUTION | RESPIRATORY_TRACT | 12 refills | Status: DC | PRN
Start: 2023-05-20 — End: 2023-05-20
  Filled 2023-05-20: qty 90, 5d supply, fill #0

## 2023-05-20 MED ORDER — HYDROCORTISONE 1 % EX CREA
TOPICAL_CREAM | Freq: Four times a day (QID) | CUTANEOUS | 0 refills | Status: DC | PRN
  Filled 2023-05-20: qty 28, 30d supply, fill #0
  Filled 2023-05-24: qty 28, 14d supply, fill #0

## 2023-05-20 MED ORDER — HYDRALAZINE HCL 50 MG PO TABS
50.0000 mg | ORAL_TABLET | Freq: Three times a day (TID) | ORAL | 0 refills | Status: DC
  Filled 2023-05-20 – 2023-05-24 (×2): qty 90, 30d supply, fill #0

## 2023-05-20 MED ORDER — FENTANYL CITRATE PF 50 MCG/ML IJ SOSY
PREFILLED_SYRINGE | INTRAMUSCULAR | Status: AC
  Filled 2023-05-20: qty 1

## 2023-05-20 MED ORDER — OXYCODONE HCL 5 MG PO TABS
5.0000 mg | ORAL_TABLET | ORAL | Status: DC | PRN
  Administered 2023-05-21 (×2): 5 mg via ORAL
  Filled 2023-05-20 (×3): qty 1

## 2023-05-20 MED ORDER — CLOPIDOGREL BISULFATE 75 MG PO TABS
75.0000 mg | ORAL_TABLET | Freq: Every day | ORAL | 0 refills | Status: DC
Start: 2023-05-21 — End: 2023-07-26
  Filled 2023-05-20 – 2023-05-24 (×2): qty 30, 30d supply, fill #0

## 2023-05-20 MED ORDER — METOPROLOL SUCCINATE ER 50 MG PO TB24
50.0000 mg | ORAL_TABLET | Freq: Two times a day (BID) | ORAL | 0 refills | Status: DC
  Filled 2023-05-20: qty 60, 30d supply, fill #0

## 2023-05-20 MED ORDER — ALBUTEROL SULFATE HFA 108 (90 BASE) MCG/ACT IN AERS
2.0000 | INHALATION_SPRAY | Freq: Four times a day (QID) | RESPIRATORY_TRACT | 3 refills | Status: DC | PRN
  Filled 2023-05-20 – 2023-05-24 (×2): qty 6.7, 25d supply, fill #0

## 2023-05-20 MED ORDER — NITROGLYCERIN 0.4 MG SL SUBL
0.4000 mg | SUBLINGUAL_TABLET | SUBLINGUAL | Status: DC | PRN
  Filled 2023-05-20: qty 1

## 2023-05-20 MED ORDER — LIDOCAINE HCL 2 % IJ SOLN
10.0000 mL | Freq: Once | INTRAMUSCULAR | Status: AC
  Administered 2023-05-20: 200 mg
  Filled 2023-05-20: qty 10

## 2023-05-20 MED ORDER — LACTATED RINGERS IV BOLUS
500.0000 mL | Freq: Once | INTRAVENOUS | Status: AC
  Administered 2023-05-20: 500 mL via INTRAVENOUS

## 2023-05-20 MED ORDER — POLYVINYL ALCOHOL 1.4 % OP SOLN
1.0000 [drp] | OPHTHALMIC | 0 refills | Status: AC | PRN
Start: 2023-05-20 — End: ?
  Filled 2023-05-20: qty 15, fill #0

## 2023-05-20 MED ORDER — GUAIFENESIN ER 600 MG PO TB12
600.0000 mg | ORAL_TABLET | Freq: Two times a day (BID) | ORAL | 0 refills | Status: DC
  Filled 2023-05-20: qty 40, 20d supply, fill #0
  Filled 2023-05-24: qty 60, 30d supply, fill #0

## 2023-05-20 MED ORDER — FENTANYL CITRATE PF 50 MCG/ML IJ SOSY
25.0000 ug | PREFILLED_SYRINGE | Freq: Once | INTRAMUSCULAR | Status: AC
  Administered 2023-05-20: 25 ug via INTRAVENOUS

## 2023-05-20 MED ORDER — GABAPENTIN 100 MG PO CAPS
100.0000 mg | ORAL_CAPSULE | Freq: Every evening | ORAL | 0 refills | Status: DC | PRN
  Filled 2023-05-20 – 2023-05-24 (×2): qty 30, 30d supply, fill #0

## 2023-05-20 NOTE — Procedures (Signed)
Insertion of Chest Tube Procedure Note  [name]  [id number]  Jul 27, 1941  Date:05/20/23  Time:9:54 PM    Provider Performing: Steffanie Dunn   Procedure: Pleural Catheter Insertion w/ Imaging Guidance ([id number])  Indication(s) Pneumothorax  Consent Risks of the procedure as well as the alternatives and risks of each were explained to the patient and/or caregiver.  Consent for the procedure was obtained and is signed in the bedside chart  Anesthesia 5cc lidocaine 1%, fentanyl   Time Out Verified patient identification, verified procedure, site/side was marked, verified correct patient position, special equipment/implants available, medications/allergies/relevant history reviewed, required imaging and test results available.   Sterile Technique Maximal sterile technique including full sterile barrier drape, hand hygiene, sterile gown, sterile gloves, mask, hair covering, sterile ultrasound probe cover (if used).   Procedure Description Ultrasound used to identify appropriate pleural anatomy for placement and overlying skin marked. Area of placement cleaned and draped in sterile fashion.  A 14 French pigtail pleural catheter was placed into the right pleural space using Seldinger technique. Appropriate return of air was obtained.  The tube was connected to atrium and placed on -20 cm H2O wall suction.   Complications/Tolerance None; patient tolerated the procedure well. Chest X-ray is ordered to verify placement.   EBL Minimal  Specimen(s) none  Steffanie Dunn, DO 05/20/23 9:55 PM [facility]  For contact information, see Amion. If no response to pager, please call PCCM consult pager. After hours, 7PM- 7AM, please call Elink.

## 2023-05-20 NOTE — Telephone Encounter (Signed)
Patient called and wanted to Dr Meredeth Ide to know he was in the hospital but he may get out tomorrow.

## 2023-05-20 NOTE — Progress Notes (Signed)
   Chart and telemetry reviewed and discussed with Dr. Nelly Laurence.   Pt well known to EP team.   NSVT consistent with prior device checks and likely exacerbated in the setting of his acute illness.   Agree with increase of Toprol and can continue to titrate as tolerated.   Would not resume amiodarone unless pt has prolonged episodes with therapy.   Potassium4.0 (09/09 8295) Magnesium  2.3 (09/09 0824) Creatinine, ser  1.85* (09/09 0824) Keep K > 4.0 and Mg > 2.0    Keep EP follow up in place for 9/19.   Please call back with any further questions or if he has increasing burden of NSVT.    Casimiro Needle [address] [place], PA-C  05/20/2023 11:02 AM

## 2023-05-20 NOTE — Telephone Encounter (Signed)
Noted  

## 2023-05-20 NOTE — Progress Notes (Signed)
Patient has become short of breath and diaphoretic since he walked to the bathroom. He believes he has indigestion and that is what caused him to have this episode. Notified MD of patient condition and will continue to monitor patient.  MD is at bedside 1532

## 2023-05-20 NOTE — Plan of Care (Signed)
Patient is discharging

## 2023-05-20 NOTE — Significant Event (Signed)
Rapid Response Event Note   Reason for Call : Respiratory distress Initial Focused Assessment:  Notified by nursing staff regarding pt with increased SOB, CP, diaphoretic and oxygen saturations 87% despite being on BIPAP. Upon arrival, pt alert, anxious, in significant distress with accessory muscle use. Reportedly this is the second episode today with the earlier event around 330pm. The silicone plug on the facemask was out and was reinserted into the mask. BIPAP settings were adjusted per RRT and pt began to improve WOB. PCXR ordered stat. After about 10-15 mins, pt's WOB improved and oxygen saturations were 98%. Pt was also given an albuterol neb per RRT.   2042-98.90F, HR 88 177/92 (114), rr 24, sats 100% on BIPAP 12/8   Interventions:  -PCXR (pending)  Addendum: PTX on PCXR-PCCM consulted for CT placement.     MD Notified: per primary MD Call Time: 2028 Arrival Time: 2030 End Time: 2144  Rose Fillers, RN

## 2023-05-20 NOTE — Consult Note (Signed)
NAME:  [name], MRN:  [id number], DOB:  01/23/1941, LOS: 3 ADMISSION DATE:  05/16/2023, CONSULTATION DATE:  9/9 REFERRING MD:  IMTS , CHIEF COMPLAINT:  acute pneumothorax   History of Present Illness:  [name] is a 82 y/o gentleman with a history of COPD who presented on 9/4 with acute COPD exacerbation. He has been managed with LAMA, LABA, ICS and suddenly worsened this afternoon after a coughing episode. He has been on BiPAP and steadily growing more SOB. CXR obtained by team showing pneumothorax. PCCM consulted for chest tube placement.    Pertinent  Medical History  COPD NSVT  Significant Hospital Events: Including procedures, antibiotic start and stop dates in addition to other pertinent events   9/5 admitted 9/9 pneumothorax on R> chest tube  Interim History / Subjective:    Objective   Blood pressure (!) 148/73, pulse 77, temperature 98 F (36.7 C), temperature source Oral, resp. rate (!) 22, height 5\' 5"  (1.651 Brewer), weight 61.1 kg, SpO2 100%.    Vent Mode: PCV;BIPAP FiO2 (%):  [40 %] 40 % Set Rate:  [10 bmp] 10 bmp PEEP:  [5 cmH20] 5 cmH20   Intake/Output Summary (Last 24 hours) at 05/20/2023 2156 Last data filed at 05/20/2023 1957 Gross per 24 hour  Intake 600 ml  Output 100 ml  Net 500 ml   Filed Weights   05/18/23 0427 05/19/23 0608 05/20/23 0353  Weight: 61.2 kg 63.1 kg 61.1 kg    Examination: General: ill appearing man on bipap in mild distress HENT: [place]/AT, eyes anicteric Lungs: tachypnea Abdomen: nondistended Extremities: no cyanosis Neuro: awake, alert, able to try to talk, comprehension appears intact Derm: warm, dry  Resolved Hospital Problem list     Assessment & Plan:  Acute secondary pneumothorax on the R due to AE COPD -R pigtail placed, CXR pending -con't chest tube to -20cm H2O suction -oxycodone PRN for pain control  Acute COPD exacerbation -con't triple inhaled therapy -may need some steroids  Best Practice (right click and  "Reselect all SmartList Selections" daily)   Per primary  Labs   CBC: Recent Labs  Lab 05/16/23 0153 05/18/23 0420 05/20/23 0824  WBC 6.8 8.2 10.4  HGB 10.7* 11.1* 11.5*  HCT 34.4* 35.3* 36.4*  MCV 87.5 85.5 85.4  PLT 113* 147* 165    Basic Metabolic Panel: Recent Labs  Lab 05/16/23 0350 05/16/23 1621 05/17/23 0920 05/18/23 0420 05/19/23 0444 05/20/23 0824  NA  --  139 137 133* 137 136  K  --  4.2 3.2* 4.1 4.0 4.0  CL  --  102 102 97* 96* 98  CO2  --  23 26 25 24 26   GLUCOSE  --  164* 121* 103* 114* 115*  BUN  --  26* 28* 46* 58* 64*  CREATININE  --  1.78* 1.67* 1.89* 2.01* 1.85*  CALCIUM  --  8.9 8.7* 9.3 9.0 8.7*  MG 1.6*  --  2.1  --  2.3 2.3  PHOS  --   --  3.4  --   --  4.4   GFR: Estimated Creatinine Clearance: 28 mL/min (A) (by C-G formula based on SCr of 1.85 mg/dL (H)). Recent Labs  Lab 05/16/23 0153 05/18/23 0420 05/20/23 0824  WBC 6.8 8.2 10.4    Liver Function Tests: Recent Labs  Lab 05/17/23 0920 05/20/23 0824  ALBUMIN 4.0 3.8   No results for input(s): "LIPASE", "AMYLASE" in the last 168 hours. No results for input(s): "AMMONIA" in the  last 168 hours.  ABG    Component Value Date/Time   PHART 7.280 (L) 04/03/2023 0356   PCO2ART 51.9 (H) 04/03/2023 0356   PO2ART 108 04/03/2023 0356   HCO3 24.0 04/18/2023 0801   TCO2 25 04/18/2023 0801   ACIDBASEDEF 3.0 (H) 04/03/2023 0356   O2SAT 91 04/18/2023 0801     Coagulation Profile: Recent Labs  Lab 05/16/23 0153  INR 1.1    Cardiac Enzymes: No results for input(s): "CKTOTAL", "CKMB", "CKMBINDEX", "TROPONINI" in the last 168 hours.  HbA1C: Hgb A1c MFr Bld  Date/Time Value Ref Brewer Status  12/12/2022 02:49 AM 6.1 (H) 4.8 - 5.6 % Final    Comment:    (NOTE)         Prediabetes: 5.7 - 6.4         Diabetes: >6.4         Glycemic control for adults with diabetes: <7.0     CBG: No results for input(s): "GLUCAP" in the last 168 hours.  Review of Systems:   Limited due to  acuity of situation.  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   Surgical History:   Past Surgical History:  Procedure Laterality Date   BI-VENTRICULAR IMPLANTABLE CARDIOVERTER DEFIBRILLATOR N/A 08/12/2014   MDT [name] XT CRTD implanted by Dr [name]   BIV ICD GENERATOR CHANGEOUT N/A 08/08/2021   Procedure: BIV ICD GENERATOR CHANGEOUT;  Surgeon: Vincent Range, MD;  Location: [facility] INVASIVE CV LAB;  Service: Cardiovascular;  Laterality: N/A;   CARDIAC CATHETERIZATION     ejection fraction 50%   CORONARY STENT INTERVENTION N/A 12/10/2022   Procedure: CORONARY STENT INTERVENTION;  Surgeon: Swaziland, Peter M, MD;  Location: [facility] INVASIVE CV LAB;  Service: Cardiovascular;  Laterality: N/A;   CORONARY STENT INTERVENTION N/A 12/12/2022   Procedure: CORONARY STENT INTERVENTION;  Surgeon: Swaziland, Peter M, MD;  Location[facility] INVASIVE CV LAB;  Service: Cardiovascular;  Laterality: N/A;   CORONARY ULTRASOUND/IVUS N/A 12/10/2022   Procedure: Intravascular  Ultrasound/IVUS;  Surgeon: Swaziland, Peter M, MD;  Location: [facility] INVASIVE CV LAB;  Service: Cardiovascular;  Laterality: N/A;   CORONARY ULTRASOUND/IVUS N/A 12/12/2022   Procedure: Coronary Ultrasound/IVUS;  Surgeon: Swaziland, Peter M, MD;  Location: [facility] INVASIVE CV LAB;  Service: Cardiovascular;  Laterality: N/A;   LEFT HEART CATH AND CORONARY ANGIOGRAPHY N/A 12/10/2022   Procedure: LEFT HEART CATH AND CORONARY ANGIOGRAPHY;  Surgeon: Swaziland, Peter M, MD;  Location: [facility] INVASIVE CV LAB;  Service: Cardiovascular;  Laterality: N/A;   LEFT HEART CATHETERIZATION WITH CORONARY ANGIOGRAM N/A 05/08/2014   Procedure: LEFT HEART CATHETERIZATION WITH CORONARY ANGIOGRAM;  Surgeon: Micheline Chapman, MD;  Location: [facility] CATH LAB;  Service: Cardiovascular;  Laterality: N/A;     Social History:   reports that he quit smoking about 10 years ago. His smoking use included cigarettes. He started smoking about 66 years ago.  He has a 28 pack-year smoking history. He has never used smokeless tobacco. He reports that he does not drink alcohol and does not use drugs.   Family History:  His family history is negative for Heart attack and Stroke.   Allergies Allergies  Allergen Reactions   Acyclovir And Related Other (See Comments)    Pt states he is not familiar with this medication.   Aspirin Other (See Comments)    GI upset at high doses only.     Home Medications  Prior to Admission medications   Medication Sig Start Date End Date Taking? Authorizing Provider  aspirin 81 MG chewable tablet Chew by mouth daily.   Yes [provider]  atorvastatin (LIPITOR) 80 MG tablet Take 1 tablet (80 mg total) by mouth once daily. 04/29/23  Yes Andrey Farmer, PA-C  clopidogrel (PLAVIX) 75 MG tablet Take 1 tablet (75 mg total) by mouth daily. 04/29/23  Yes Andrey Farmer, PA-C  famotidine (PEPCID) 40 MG tablet Take 1 tablet (40 mg total) by mouth daily. 04/19/23  Yes Monna Fam, MD  Glycopyrrolate-Formoterol  (BEVESPI AEROSPHERE) 9-4.8 MCG/ACT AERO Inhale 2 puffs into the lungs 2 (two) times daily. Do NOT use more than 2 times per day   Yes [provider]  guaiFENesin (MUCINEX) 600 MG 12 hr tablet Take 1 tablet (600 mg total) by mouth 2 (two) times daily. 05/20/23 06/19/23 Yes Alexander-Savino, [place], MD  ipratropium-albuterol (DUONEB) 0.5-2.5 (3) MG/3ML SOLN Take 3 mLs by nebulization every 6 (six) hours as needed (wheezing or shortness of breath). 04/19/23  Yes Monna Fam, MD  losartan (COZAAR) 50 MG tablet Take 1 tablet (50 mg total) by mouth daily. 04/29/23  Yes Andrey Farmer, PA-C  metoprolol succinate (TOPROL-XL) 25 MG 24 hr tablet Take 1 tablet (25 mg total) by mouth daily. Take with or immediately following a meal. 04/29/23  Yes Andrey Farmer, PA-C  mometasone-formoterol [facility]) 200-5 MCG/ACT AERO Inhale 2 puffs into the lungs 2 (two) times daily. 04/04/23  Yes Annett Fabian, MD  potassium chloride (KLOR-CON Brewer) 10 MEQ tablet Take 1 tablet (10 mEq total) by mouth daily. 04/29/23  Yes Andrey Farmer, PA-C  tiotropium (SPIRIVA) 18 MCG inhalation capsule Place 1 capsule (18 mcg total) into inhaler and inhale daily. 04/04/23  Yes Annett Fabian, MD  torsemide (DEMADEX) 20 MG tablet Take 0.5 tablets (10 mg total) by mouth daily. 04/29/23  Yes Andrey Farmer, PA-C  albuterol (VENTOLIN HFA) 108 (90 Base) MCG/ACT inhaler Inhale 2 puffs into the lungs every 6 (six) hours as needed for wheezing or shortness of breath. 05/20/23   Reymundo Poll, MD  aspirin 81 MG chewable tablet Chew 1 tablet (81 mg total) by mouth daily. 05/21/23   Alexander-Savino, [place], MD  clopidogrel (PLAVIX) 75 MG tablet Take 1 tablet (75 mg total) by mouth daily. 05/21/23   Alexander-Savino, [place], MD  feeding supplement (ENSURE ENLIVE / ENSURE PLUS) LIQD Take 237 mLs by mouth 2 (two) times daily between meals. 05/20/23   Alexander-Savino, [place], MD  gabapentin (NEURONTIN) 100 MG capsule Take 1  capsule (100 mg total) by mouth at bedtime as needed (for shoulder pain). 05/20/23   Reymundo Poll, MD  hydrALAZINE (APRESOLINE) 50 MG tablet Take 1 tablet (50 mg total) by mouth 3 (three) times daily. 05/20/23   Reymundo Poll, MD  hydrocortisone cream 1 % Apply topically every 6 (six) hours as needed for itching. 05/20/23   Hassan Rowan, [place], MD  metoprolol succinate (TOPROL-XL) 50  MG 24 hr tablet Take 1 tablet (50 mg total) by mouth 2 (two) times daily. Take with or immediately following a meal. 05/20/23   Reymundo Poll, MD  polyvinyl alcohol (LIQUIFILM TEARS) 1.4 % ophthalmic solution Place 1 drop into both eyes as needed for dry eyes. 05/20/23   Manuela Neptune, MD     Critical care time: n/a       Steffanie Dunn, DO 05/20/23 9:56 PM [facility]  For contact information, see Amion. If no response to pager, please call PCCM consult pager. After hours, 7PM- 7AM, please call Elink.

## 2023-05-20 NOTE — Progress Notes (Signed)
Night team paged by Elease Hashimoto, RN regarding elevated BP 217/106, SOB, and CP. RT and rapid called to bedside, CXR ordered. Evaluated patient at bedside with Dr. Sherrilee Gilles. Patient was resting in bed with BIPAP in place, able to vocalize that CP and SOB were improving. Endorsed a mild headache that had been present throughout the day.   Patient with RR 15, BIPAP Peep 8, O2 concentration 40%, saturating well on supplemental oxygen. BP 177/92, HR 87. On pulmonary exam, decreased breath sounds on right side and diffuse wheezing throughout both lungs A/P. EKG unchanged from earlier EKG and EKG on admission. Troponins stable at 40 and 48 down from 81 on admission. CXR showing collapsed right lung/pneumothorax compared to previous CXR on 9/5. Discussed imaging with Dr. Chestine Spore with PCCM, who agreed to proceed with immediate intervention with chest tube.   Delorise Shiner ICU, NP and Dr. Chestine Spore consented the patient for chest tube placement, patient's grandson was also notified and updated regarding the need for the procedure. Procedure completed without complications, post tube placement CRX showed re-inflation of right lung. Appreciate PCCM assistance.   Nataki Mccrumb Colbert Coyer, MD PGY-1 IMTP

## 2023-05-20 NOTE — Progress Notes (Signed)
Mobility Specialist Progress Note:   05/20/23 1200  Mobility  Activity Ambulated with assistance in hallway  Level of Assistance Standby assist, set-up cues, supervision of patient - no hands on  Assistive Device None  Distance Ambulated (ft) 940 ft  Activity Response Tolerated well  Mobility Referral Yes  $Mobility charge 1 Mobility  Mobility Specialist Start Time (ACUTE ONLY) 1157  Mobility Specialist Stop Time (ACUTE ONLY) 1204  Mobility Specialist Time Calculation (min) (ACUTE ONLY) 7 min    Pre Mobility: 65 HR During Mobility: 95 HR Post Mobility:  91 HR  Pt received in bed, agreeable to mobility. C/o having dry cough, otherwise asymptomatic. Pt left ambulating in room with call bell near. RN present.  [name] Mobility Specialist Please contact via Special educational needs teacher or Rehab office at [phone]

## 2023-05-20 NOTE — Progress Notes (Signed)
HD#3 SUBJECTIVE:  Patient Summary: [name] is a 82 y.o. with a pertinent PMH of COPD, HEmodEF, Afib here for COPD exacerbation.   Overnight Events: No acute events ON.   Interim History: Patient resting comfortably in bed, Anxious about leaving as he needs to return to work. Has enjoyed PT. Feels stable to go home.   In the afternoon: Nurse messaged about two episodes of bigeminy, with the last one leading the pt to feel SOB, Diaphoretic, and with chest palpitations.   OBJECTIVE:  Vital Signs: Vitals:   05/20/23 0801 05/20/23 1153 05/20/23 1449 05/20/23 1550  BP:  (!) 150/73    Pulse:  67    Resp:  19  18  Temp:  98.2 F (36.8 C)    TempSrc:  Oral    SpO2: 97%  97%   Weight:      Height:       Supplemental O2: Room Air SpO2: 97 % O2 Flow Rate (L/min): 2 L/min  Filed Weights   05/18/23 0427 05/19/23 0608 05/20/23 0353  Weight: 61.2 kg 63.1 kg 61.1 kg     Intake/Output Summary (Last 24 hours) at 05/20/2023 1630 Last data filed at 05/20/2023 0750 Gross per 24 hour  Intake 600 ml  Output --  Net 600 ml   Net IO Since Admission: -1,079.36 mL [05/20/23 1630]  Physical Exam: Physical Exam Constitutional:      General: He is not in acute distress.    Appearance: He is normal weight. He is not ill-appearing.  Cardiovascular:     Rate and Rhythm: Normal rate and regular rhythm.     Heart sounds: S1 normal and S2 normal. No murmur heard.    No friction rub. No gallop.  Pulmonary:     Effort: No tachypnea, bradypnea or respiratory distress.     Breath sounds: No decreased air movement. No decreased breath sounds, wheezing, rhonchi or rales.  Abdominal:     General: There is no distension.     Palpations: There is no hepatomegaly or mass.     Tenderness: There is no abdominal tenderness. There is no guarding or rebound.  Musculoskeletal:     Right lower leg: No edema.     Left lower leg: No edema.  Skin:    Capillary Refill: Capillary refill takes less than 2  seconds.     Coloration: Skin is not cyanotic.     Nails: There is no clubbing.  Neurological:     Mental Status: He is alert.    ASSESSMENT/PLAN:  Assessment: Active Problems:   NSVT (nonsustained ventricular tachycardia) (HCC)   Chronic combined systolic and diastolic heart failure (HCC)   Paroxysmal A-fib (HCC)   Plan:  COPD with exacerbation Acute on chronic hypoxic respiratory failure His wheezing has resolved.  He will continue Bevespi inhaler + albuterol prn when he leaves the hospital. - Continue nebulized triple therapy for now - As needed albuterol - Prednisone through today.  -Tussinex for expectoration and mucous suppression.   NSVT History of same.  ICD in place.  Has had multiple intermittent episodes but one run that was 40-50 beats yesterday. Cardiology was consulted, which recommended to increase metoprolol to 50 mg BID. Amiodarone as second line if metoprolol failed. He did not receive amiodarone, and NSVT did not return with the increase in 50 mg. However, today, he had an episode when he became SOB, diaphoretic, and chest palpitations. HR in the 40s. EP consulted which advised to continue on metoprolol.  We appreciate their recommendations. - Metoprolol succinate 50 mg twice daily   Prerenal azotemia Orthostatic hypotension Slightly increased BUN/creatinine.  Holding torsemide for now. He has been diuresed and Cr function was increasing. He had a 22 mmHg upon standing from sitting down. [name]5L NS given that he is not fluid overloaded.  -Continue to monitor overnight  -Metorpololg 50mg  twice daily   HFmrEF s/p CRT-D Prior Echocardiogram April 2024 LVEF 35-40% with moderate LVH JVD and hepatojugular reflex have resolved. No BLE edema. Pt is not fluid overloaded. Has not gained weight.  [name] / [name] at discharge if renal function permitted.  -Hold home bumex due to pre-renal azotemia. -cw metoprolol 50mg  BID -Strict I/O  -Daily weights   HTN -Holding  Losatan due to elevation in serum creatinine. -Hydralazine 50 mg TID  -Continue Metoprolol succinate 50 mg daily -Continue to monitor BP and symptoms   Paroxsymal Atrial Fibrillation has defibrillator in place.  -Continue on metoprolol 50 mg BID -No anticoagulation per EP  CAD s/p PCI  Continue home medication regimen of Plavix 75gm, Lipitor 80mg , and Aspirin 81mg   Shoulder pain Started after his pneumococcal vaccination last month. Has not limited range of motion, but is tingling and has anesthesia over deltoid. This will sometimes radiate down his deltoid. Empty can test is negative. Tylenol does not help. Improving with gabapentin.  -cw gabapentin 100mg  nightly  Best Practice: Diet: Regular diet IVF: Fluids: 0.9NS, Rate:  250 cc bolus VTE: enoxaparin (LOVENOX) injection 30 mg Start: 05/16/23 2200 Code: Full  Signature: [facility]  Internal Medicine Resident, PGY-1 [name]  Pager: [phone] 4:30 PM, 05/20/2023   Please contact the on call pager after 5 pm and on weekends at [phone].

## 2023-05-20 NOTE — Discharge Instructions (Addendum)
Mr. Burt, Narez were admitted because you had worsening of your COPD. The following are changes we made to some of your medications:  Please make sure you do the following:   For your COPD: -Use your Bevespi in the AM and PM  -Use fluticasone in the AM and PM -Use albuterol whenever you are short of breath and need immediate relief  -Guaifenesin (Mucinex) 60mg  twice a day as needed for sputum production  -STOP taking duoneb, Spiriva, and dulera.  For your wound on your chest (where you had the chest tube):   -Keep your wound clean, change your dressings daily. Apply vaseline, gauze, and tegaderm.   ----Melene Plan FOR TWO DAYS-----  For pain: Take tylenol up to 1,000 mg three times a day.  Follow up with pulmonology.  For your heart:  -Continue taking metoprolol 75 mg twice a day. -Continue taking aspirin 81mg  daily  -Continue taking Plavix 75mg  daily -Continue taking Atorvastatin 80mg  daily   Your volume status is currently okay. FOR NOW: -Stop taking Torsemide 0.5 mg daily . You will need to re-start it if:   -You have weight gain more than 3 lbs in a day or more than 5 lbs in a week.   -Worsening lower extremity swelling.   -You will need to follow-up with your heart doctor within a week to check your kidney function.  For your blood pressure:  -Please take 50mg  of Hydralazine three times a day.  -STOP taking your losartan 50mg  daily  -You will need to follow up with your PCP for this as hydralazine is NOT a long term medication for your blood pressure.  For your shoulder pain:  -Continue with 100mg  of Gabapentin at night.  This medication can make you drowsy. Make sure you follow up about this with your primary care doctor.   For your dry eyes:  Use Liquifilm tears 1.4% ophthalmic solution 1 drop in each eye as needed   For your itch:  Use hydrocortisone cream 1% every 6 hours as needed for up to two weeks. DO NOT USE it continuously for more than two weeks.    Sincerely,  Manuela Neptune, MD  PGY-1  Internal Medicine [name]

## 2023-05-20 NOTE — TOC Initial Note (Signed)
Transition of Care [facility]) - Initial/Assessment Note    Patient Details  Name: [name] MRN: [id number] Date of Birth: April 20, 1941  Transition of Care [facility]) CM/SW Contact:    Gala Lewandowsky, RN Phone Number: 05/20/2023, 2:29 PM  Clinical Narrative:  Plan for transition home today. MATCH completed for medication assistance. TOC to provide medications for the family.   MATCH MEDICATION ASSISTANCE CARD Pharmacies please call [phone] for claim processing assistance.  Rx BIN: [id number] Rx Group: [id number] Rx PCN: [id number] Relationship Code: 1 Person Code: 01  Patient ID (MRN): [id number]    Patient Name: [name]   Patient DOB: 08-Sep-1941   Discharge Date: 05/20/2023  Expiration Date: 05/28/2023 (must be filled within 7 days of discharge)                   Expected Discharge Plan: Home/Self Care Barriers to Discharge: No Barriers Identified   Expected Discharge Plan and Services In-house Referral: Financial Counselor, Clinical Social Work Discharge Planning Services: CM Consult, HF Clinic   Living arrangements for the past 2 months: Single Family Home   Prior Living Arrangements/Services Living arrangements for the past 2 months: Single Family Home Lives with:: Self Patient language and need for interpreter reviewed:: Yes Do you feel safe going back to the place where you live?: Yes      Need for Family Participation in Patient Care: No (Comment) Care giver support system in place?: No (comment)   Criminal Activity/Legal Involvement Pertinent to Current Situation/Hospitalization: No - Comment as needed  Activities of Daily Living Home Assistive Devices/Equipment: None ADL Screening (condition at time of admission) Patient's cognitive ability adequate to safely complete daily activities?: Yes Is the patient deaf or have difficulty hearing?: No Does the patient have difficulty seeing, even when wearing glasses/contacts?: No Does the patient have difficulty  concentrating, remembering, or making decisions?: No Patient able to express need for assistance with ADLs?: Yes Does the patient have difficulty dressing or bathing?: No Independently performs ADLs?: Yes (appropriate for developmental age) Does the patient have difficulty walking or climbing stairs?: No Weakness of Legs: None Weakness of Arms/Hands: None  Permission Sought/Granted Permission sought to share information with : Case Manager    Emotional Assessment Appearance:: Appears stated age       Alcohol / Substance Use: Not Applicable Psych Involvement: No (comment)  Admission diagnosis:  SOB (shortness of breath) [R06.02] COPD exacerbation (HCC) [J44.1] Acute on chronic congestive heart failure, unspecified heart failure type (HCC) [I50.9] COPD with acute exacerbation (HCC) [J44.1] Patient Active Problem List   Diagnosis Date Noted   Acute exacerbation of CHF (congestive heart failure) (HCC) 04/03/2023   Hypertensive emergency 04/03/2023   Acute respiratory distress 04/02/2023   Abdominal pain 12/15/2022   Non-ST elevation (NSTEMI) myocardial infarction (HCC) 12/15/2022   Paroxysmal A-fib (HCC) 12/15/2022   Status post coronary artery stent placement 12/13/2022   Myocardial infarction sequelae (HCC) 12/13/2022   Chest pain 12/11/2022   Unstable angina (HCC) 12/10/2022   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   Depression 09/15/2018   OSA (obstructive sleep apnea) 04/18/2018   Healthcare maintenance 04/18/2018   Essential hypertension 02/06/2017   Dizziness 02/06/2017   Coronary artery disease involving native coronary artery of native heart without angina pectoris 02/06/2017   Near syncope 02/06/2017   Allergic rhinitis 01/17/2017   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   Nocturnal hypoxemia 07/03/2016   Facial abscess 02/04/2016   Dental abscess    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/12/2014  Angina decubitus 06/15/2014   Acute renal  failure (HCC) 05/31/2014   Sinus bradycardia 05/31/2014   Syncope 05/30/2014   Chronic combined systolic and diastolic heart failure (HCC)    Mixed Ischemic/Non-Ischemic Cardiomyopathy    NSVT (nonsustained ventricular tachycardia) (HCC)    Mixed hyperlipidemia    Respiratory arrest (HCC) 05/18/2014   CAD (coronary artery disease) 05/18/2014   Pulmonary edema 05/10/2014   HTN (hypertension) 05/08/2014   LBBB (left bundle branch block) 05/08/2014   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   Low back pain 10/05/2013   NSVT (nonsustained ventricular tachycardia) (HCC) 09/04/2013   Pneumonia 09/02/2013   Flu-like symptoms 09/02/2013   Anemia 09/02/2013   COPD with asthma    Fatigue 06/18/2013   Nocturia 06/18/2013   Dyspnea 06/18/2013   LV dysfunction 12/13/2011   CAD (coronary artery disease) 10/10/2011   Tobacco abuse 10/10/2011   Dyslipidemia    Noncompliance    PCP:  Claiborne Rigg, NP Pharmacy:   [facility] [address], [address] [place] [address] Phone: [phone] Fax: [phone]  [name] [address] [place] [address] Phone: [phone] Fax: [phone]  [facility] [place], [place] - [address] [place] [address] Phone: [phone] Fax: [phone]  Social Determinants of Health (SDOH) Social History: SDOH Screenings   Food Insecurity: No Food Insecurity (05/16/2023)  Housing: Low Risk  (05/16/2023)  Transportation Needs: No Transportation Needs (05/16/2023)  Utilities: Not At Risk (05/16/2023)  Alcohol Screen: Low Risk  (04/19/2023)  Depression (PHQ2-9): Low Risk  (01/14/2023)  Recent Concern: Depression (PHQ2-9) - Medium Risk (11/05/2022)  Financial Resource Strain: High Risk (04/29/2023)  Tobacco Use: Medium Risk (04/29/2023)   Readmission Risk Interventions     No data to display

## 2023-05-20 NOTE — Progress Notes (Signed)
NT notified that patient said he was having trouble breathing. RN to the room to find patient sob using accessory muscles, complaining of cp, bp elevated. Pt said he felt like he was suffocating need to removed the bipap so he could breathe. Respiratory therapist and rapid response nurse called to bedside. Rt administered PRN treatments and adjusted bi pap. Rapid response nurse ordered chest xray. On call provider notified and to bedside to see patient. Providers evaluated chest xray and called critical care team. Chest tube placed. Pt states he feels much better.

## 2023-05-20 NOTE — Progress Notes (Signed)
Paged by nursing for patient with chest pain, dyspnea, and diaphoresis.  Went to see the patient who was having significant chest pain, dyspnea and diaphoresis that was worse than earlier in the day.  Heart rate on the monitor was 70s to 90s but manual pulse was closer to 40-50 with his frequent PVCs.  Initial blood pressure significantly elevated at 216/106 the patient continued to saturate well on supplemental oxygen.  There was mild end expiratory wheezes on lung exam.  With history of coronary artery disease and PCI to the RCA in 12/2022 we had a repeat EKG.  Discussed EKG with Dr. Eldridge Dace who does not see any significant ischemic or other changes in his EKG compared to admission EKG.  Patient was placed on BiPAP with significant improvement in his symptoms. - Troponins x 2 - Continue BiPAP - Please page on-call team if any return of symptoms  Vincent Morel, DO Internal Medicine Resident, PGY-2 Pager# [phone] Please contact the on call pager after 5 pm and on weekends at [phone].

## 2023-05-20 NOTE — Progress Notes (Signed)
eLink Physician-Brief Progress Note Patient Name: [name] DOB: [birth year] MRN: [id number]   Date of Service  05/20/2023  HPI/Events of Note  82 year old gentleman with history of COPD who initially presented with COPD exacerbation found to have a large right-sided pneumothorax.  Pigtail catheter placed.  Chest tube currently on -20 suction.  eICU Interventions  Reviewed post insertion radiograph with appropriate reinflation of the right lung.  Maintain -20 suction.     Intervention Category Intermediate Interventions: Respiratory distress - evaluation and management  Kaysen Sefcik 05/20/2023, 10:31 PM

## 2023-05-21 ENCOUNTER — Other Ambulatory Visit (HOSPITAL_COMMUNITY): Payer: Self-pay

## 2023-05-21 ENCOUNTER — Inpatient Hospital Stay (HOSPITAL_COMMUNITY): Payer: Medicare Other

## 2023-05-21 ENCOUNTER — Ambulatory Visit: Payer: Self-pay | Admitting: Nurse Practitioner

## 2023-05-21 LAB — CBC
HCT: 35.4 % — ABNORMAL LOW (ref 39.0–52.0)
Hemoglobin: 11.1 g/dL — ABNORMAL LOW (ref 13.0–17.0)
MCH: 26.7 pg (ref 26.0–34.0)
MCHC: 31.4 g/dL (ref 30.0–36.0)
MCV: 85.3 fL (ref 80.0–100.0)
Platelets: 174 10*3/uL (ref 150–400)
RBC: 4.15 MIL/uL — ABNORMAL LOW (ref 4.22–5.81)
RDW: 15.4 % (ref 11.5–15.5)
WBC: 10 10*3/uL (ref 4.0–10.5)
nRBC: 0 % (ref 0.0–0.2)

## 2023-05-21 LAB — BASIC METABOLIC PANEL
Anion gap: 11 (ref 5–15)
BUN: 52 mg/dL — ABNORMAL HIGH (ref 8–23)
CO2: 26 mmol/L (ref 22–32)
Calcium: 9 mg/dL (ref 8.9–10.3)
Chloride: 101 mmol/L (ref 98–111)
Creatinine, Ser: 1.31 mg/dL — ABNORMAL HIGH (ref 0.61–1.24)
GFR, Estimated: 55 mL/min — ABNORMAL LOW (ref >60)
Glucose, Bld: 112 mg/dL — ABNORMAL HIGH (ref 70–99)
Potassium: 4.4 mmol/L (ref 3.5–5.1)
Sodium: 138 mmol/L (ref 135–145)

## 2023-05-21 LAB — MAGNESIUM: Magnesium: 2.5 mg/dL — ABNORMAL HIGH (ref 1.7–2.4)

## 2023-05-21 MED ORDER — METOPROLOL SUCCINATE ER 50 MG PO TB24
75.0000 mg | ORAL_TABLET | Freq: Two times a day (BID) | ORAL | Status: DC
  Administered 2023-05-21 – 2023-05-23 (×4): 75 mg via ORAL
  Filled 2023-05-21 (×4): qty 1

## 2023-05-21 MED ORDER — METHOCARBAMOL 500 MG PO TABS
1500.0000 mg | ORAL_TABLET | Freq: Three times a day (TID) | ORAL | Status: DC
  Administered 2023-05-21 (×2): 1500 mg via ORAL
  Filled 2023-05-21 (×2): qty 3

## 2023-05-21 MED ORDER — ENSURE ENLIVE PO LIQD
237.0000 mL | Freq: Two times a day (BID) | ORAL | Status: DC
  Administered 2023-05-22 – 2023-05-23 (×3): 237 mL via ORAL

## 2023-05-21 NOTE — Progress Notes (Signed)
HD#4 SUBJECTIVE:   Overnight Events: Heart rate on the monitor was 70s to 90s but manual pulse was closer to 40-50 with his frequent PVCs in the afternoon. Lungs clear to auscultation. He complained of SOB and diaphoresis. No CP. Orthostatic. Given .5L of LR. EP consulted, pt's metoprolol was increased to 50mg  BID and plan was to stay at that increased dose for the day. Cross cover was then messages. SOB with CP and diaphoretic and increased wheezing. EKG did not show any significant changes since admission. Pt was placed on BiPap with improvement. Troponins were ordered, flat in the 40s. Night team was then paged, pt worsened SOB and CP.  RT was called at bedside. CXR was ordered. BiPap helped but he had decreased lung sounds on the right and wheezing throughout. CXR concerning for pneumothorax. PCCM consulted and placed a chest tube.   Interim History: He feels a lot better with the chest tube. Feels he can breathe.   OBJECTIVE:  Vital Signs: Vitals:   05/21/23 0733 05/21/23 1002 05/21/23 1023 05/21/23 1419  BP: (!) 152/74 (!) 145/71 (!) 145/71 (!) 140/73  Pulse: (!) 58 65 64 (!) 58  Resp: 16 14    Temp: 97.9 F (36.6 C)     TempSrc: Oral     SpO2: 94% 93%  93%  Weight:      Height:       Supplemental O2: Nasal Cannula SpO2: 93 % O2 Flow Rate (L/min): 2 L/min FiO2 (%): 40 %  Filed Weights   05/19/23 0608 05/20/23 0353 05/21/23 0438  Weight: 63.1 kg 61.1 kg 60.1 kg     Intake/Output Summary (Last 24 hours) at 05/21/2023 1633 Last data filed at 05/21/2023 1518 Gross per 24 hour  Intake 1092 ml  Output 1020 ml  Net 72 ml   Net IO Since Admission: -1,007.36 mL [05/21/23 1633]  Physical Exam: Physical Exam Constitutional:      General: He is not in acute distress.    Appearance: He is not toxic-appearing.  Neck:     Vascular: No JVD.  Cardiovascular:     Rate and Rhythm: Regular rhythm. Bradycardia present.     Heart sounds: S1 normal and S2 normal. Heart sounds not  distant. No murmur heard.    No friction rub.  Pulmonary:     Effort: Respiratory distress present. No tachypnea, bradypnea or accessory muscle usage.     Breath sounds: Examination of the right-upper field reveals decreased breath sounds. Examination of the right-middle field reveals decreased breath sounds. Examination of the left-middle field reveals wheezing. Examination of the left-lower field reveals wheezing. Decreased breath sounds and wheezing present. No rhonchi or rales.  Abdominal:     General: There is no distension.     Tenderness: There is no abdominal tenderness.  Musculoskeletal:     Right lower leg: No edema.     Left lower leg: No edema.  Neurological:     Mental Status: He is alert.    Patient Lines/Drains/Airways Status     Active Line/Drains/Airways     Name Placement date Placement time Site Days   Peripheral IV 05/20/23 20 G Right Antecubital 05/20/23  1712  Antecubital  1   Chest Tube 1 Lateral;Right 05/20/23  --  --  1             ASSESSMENT/PLAN:  Assessment: Active Problems:   NSVT (nonsustained ventricular tachycardia) (HCC)   Chronic combined systolic and diastolic heart failure (HCC)   Paroxysmal  A-fib (HCC)   Plan:  COPD with exacerbation Acute on chronic hypoxic respiratory failure - Continue nebulized triple therapy for now - As needed albuterol - S/p azithromycin and prednisone. -Tussinex for expectoration and mucous suppression. -Will likely discharge on triple therapy   R Spontaneous Pneumothorax  Likely due to COPD on BiPap -S/p chest tube placement, appreciate PCCM -Robaxin 1,500mg  TID daily  -Oxycodone 5mg  q4hrs PRN for pain  NSVT History of same.  ICD in place.  Has had multiple intermittent episodes throughout his hospitalization. These have increased in frequency since yesterday. Likely in the setting of COPD. - Increase Metoprolol succinate 75 mg twice daily   Prerenal azotemia Orthostatic hypotension Slightly  increased BUN/creatinine at baseline. Holding torsemide for now. He has been diuresed and Cr function was increasing (2.01 on 05/19/2023). Was given .5L NS bolus yesterday. He is not fluid overloaded on exam. Cr improved today 1.31 and at baseline. -Continue to monitor with BMP  HFmrEF s/p CRT-D Prior Echocardiogram April 2024 LVEF 35-40% with moderate LVH JVD and hepatojugular reflex have resolved. No BLE edema. Pt is not fluid overloaded. Has not gained weight.  [name] / [name] at discharge if renal function permitted.  -Holding home bumex as he is not fluid overloaded -metoprolol as above -Strict I/O  -Daily weights    HTN -Holding [place]atan for now due to renal function. 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  -cw metoprolol as above.  -No anticoagulation per EP   CAD s/p PCI  Continue home medication regimen of Plavix 75gm, Lipitor 80mg , and Aspirin 81mg    Shoulder pain Has improved. -cw gabapentin 100mg  nightly   Best Practice: Diet: Regular diet IVF: Fluids: 0.9NS, Rate:  250 cc bolus VTE: enoxaparin (LOVENOX) injection 30 mg Start: 05/16/23 2200 Code: Full   Signature: [facility]  Internal Medicine Resident, PGY-1 [name]  Pager: [phone] 4:33 PM, 05/21/2023   Please contact the on call pager after 5 pm and on weekends at [phone].

## 2023-05-21 NOTE — Progress Notes (Signed)
Mobility Specialist Progress Note:   05/21/23 1419  Therapy Vitals  Pulse Rate (!) 58  BP (!) 140/73  Oxygen Therapy  SpO2 93 %  Mobility  Activity Ambulated independently in hallway  Level of Assistance Independent after set-up  Assistive Device None  Distance Ambulated (ft) 940 ft  Activity Response Tolerated well  Mobility Referral Yes  $Mobility charge 1 Mobility  Mobility Specialist Start Time (ACUTE ONLY) 1439  Mobility Specialist Stop Time (ACUTE ONLY) 1453  Mobility Specialist Time Calculation (min) (ACUTE ONLY) 14 min    Pre Mobility: 61 HR During Mobility: 83 HR Post Mobility:  80 HR  Pt received in bed, agreeable to mobility. Voided in BR before entering hallway. Asymptomatic throughout w/ no complaints. Pt left in bed with call bell and all needs met. Chest tube on suction.  [name] Mobility Specialist Please contact via Special educational needs teacher or Rehab office at [phone]

## 2023-05-21 NOTE — Progress Notes (Signed)
   NAME:  [name], MRN:  [id number], DOB:  December 01, 1940, LOS: 4 ADMISSION DATE:  05/16/2023, CONSULTATION DATE:  9/9 REFERRING MD:  IMTS , CHIEF COMPLAINT:  acute pneumothorax   History of Present Illness:  [name] is a 82 y/o gentleman with a history of COPD who presented on 9/4 with acute COPD exacerbation. He has been managed with LAMA, LABA, ICS and suddenly worsened this afternoon after a coughing episode. He has been on BiPAP and steadily growing more SOB. CXR obtained by team showing pneumothorax. PCCM consulted for chest tube placement.    Pertinent  Medical History  COPD NSVT  Significant Hospital Events: Including procedures, antibiotic start and stop dates in addition to other pertinent events   9/5 admitted 9/9 pneumothorax on R> chest tube  Interim History / Subjective:  Feels good. No dyspnea or chest pain. CXR with slight apical PTX still. [place] with small leak, on 20cm suction.  Objective   Blood pressure (!) 152/74, pulse (!) 58, temperature 97.9 F (36.6 C), temperature source Oral, resp. rate 16, height 5\' 5"  (1.651 m), weight 60.1 kg, SpO2 94%.    Vent Mode: PCV;BIPAP FiO2 (%):  [40 %] 40 % Set Rate:  [10 bmp] 10 bmp PEEP:  [5 cmH20] 5 cmH20   Intake/Output Summary (Last 24 hours) at 05/21/2023 0917 Last data filed at 05/21/2023 0850 Gross per 24 hour  Intake 480 ml  Output 770 ml  Net -290 ml   Filed Weights   05/19/23 0608 05/20/23 0353 05/21/23 0438  Weight: 63.1 kg 61.1 kg 60.1 kg    Examination: General: adult male, in NAD HENT: [place]/AT, eyes anicteric Lungs: Normal effort. CTAB Heart: RRR, no M/R/G Abdomen: nondistended Extremities: no cyanosis Neuro: A&O x 3, no deficits Derm: warm, dry   Assessment & Plan:   Acute secondary pneumothorax on the R due to AE COPD - s/p pigtail chest tube placement 9/9. Follow up CXR 9/10 has small apical PTX and CT still with air leak. - Continue chest tube to -20cm H2O suction. - Routine chest tube  flushes per protocol. - Oxycodone PRN for pain control. - F/u CXR in AM 9/11.  Acute COPD exacerbation -con't triple inhaled therapy  Best Practice (right click and "Reselect all SmartList Selections" daily)  Per primary   Rutherford Guys, PA - [facility] For pager details, please see AMION or use Epic chat  After 1900, please call ELINK for cross coverage needs 05/21/2023, 9:20 AM

## 2023-05-21 NOTE — Progress Notes (Signed)
Patient has been transition to BIPAP at the end of shift do to change in status. Patient has been complaining of occasional shortness of breath, diaphoretic, lower HR also occurs at this time. MD aware of patient change and occurrences each time. Cardiology consulted for patient

## 2023-05-21 NOTE — Progress Notes (Signed)
Initial Nutrition Assessment  DOCUMENTATION CODES:   Not applicable  INTERVENTION:  DYS 3 diet with 1500 ml FR   Strawberry Ensure Plus High Protein po BID, each supplement provides 350 kcal and 20 grams of protein.  Encourage po intake   NUTRITION DIAGNOSIS:   Inadequate oral intake related to decreased appetite as evidenced by moderate muscle depletion, per patient/family report.  GOAL:   Patient will meet greater than or equal to 90% of their needs  MONITOR:   PO intake, Weight trends, Supplement acceptance, Skin, I & O's, Labs  REASON FOR ASSESSMENT:   Malnutrition Screening Tool    ASSESSMENT:   82 y.o. male with PMHx including COPD, who presents with acute COPD exacerbation  9/9 s/p R chest tube for pneumothorax  Visited patient at bedside who reports he does not like the food very much here and that he eats 2 meals at baseline. Patient reports he normally eats a good breakfast and then chooses whether he eats lunch or supper. Patient reports his UBW used to be 150-160# about a year ago and now his UBW as of lately is ~134#. Patient reports he lives with his 40 year old nephew and takes care of himself since his wife passed. He reports he prepares his own meals at home.   Patient reports taste changes which has been affecting his appetite. He reports not liking high sodium, greasy foods like chinese food, pizza or greasy fried chicken anymore which RD thinks is a good thing since he would not benefit from foods like this given he has CHF and a-fib.   Patient reports trouble chewing since he is missing many teeth. He complains that the vegetables are not easy for him to eat and is agreeable to a softer diet.   Labs: Glu 112, BUN 52, Cr 1.31, Mag 2.5  Meds: lipitor, NS  Wt:  05/21/23 60.1 kg  04/29/23 65 kg  04/19/23 62.1 kg  04/04/23 63.2 kg  03/18/23 67.6 kg  02/28/23 65.8 kg  02/06/23 64.4 kg  01/14/23 64.3 kg  12/28/22 66.8 kg  12/21/22 64.6 kg   PO:  75-100% intake x last 7 documented meals   I/O's: -1 L (net cumulative)      NUTRITION - FOCUSED PHYSICAL EXAM:  Flowsheet Row Most Recent Value  Orbital Region No depletion  Upper Arm Region No depletion  Thoracic and Lumbar Region No depletion  Buccal Region No depletion  Temple Region No depletion  Clavicle Bone Region No depletion  Clavicle and Acromion Bone Region No depletion  Scapular Bone Region Unable to assess  Dorsal Hand No depletion  Patellar Region Moderate depletion  Anterior Thigh Region Moderate depletion  Posterior Calf Region Moderate depletion  Edema (RD Assessment) None  Hair Reviewed  Eyes Reviewed  Mouth Reviewed  Skin Reviewed  Nails Reviewed        Diet Order:   Diet Order             DIET DYS 3 Room service appropriate? Yes; Fluid consistency: Thin; Fluid restriction: 1500 mL Fluid  Diet effective now                   EDUCATION NEEDS:   Education needs have been addressed  Skin:  Skin Assessment: Reviewed RN Assessment  Last BM:  9/9  Height:   Ht Readings from Last 1 Encounters:  05/16/23 5\' 5"  (1.651 m)    Weight:   Wt Readings from Last 1 Encounters:  05/21/23  60.1 kg    Ideal Body Weight:     BMI:  Body mass index is 22.05 kg/m.  Estimated Nutritional Needs:   Kcal:  1500-1800  Protein:  75-90 g  Fluid:  >/= 1.8 L    Leodis Rains, RDN, LDN  Clinical Nutrition

## 2023-05-22 ENCOUNTER — Inpatient Hospital Stay (HOSPITAL_COMMUNITY): Payer: Medicare Other

## 2023-05-22 LAB — CBC
HCT: 35.8 % — ABNORMAL LOW (ref 39.0–52.0)
Hemoglobin: 11.4 g/dL — ABNORMAL LOW (ref 13.0–17.0)
MCH: 27.8 pg (ref 26.0–34.0)
MCHC: 31.8 g/dL (ref 30.0–36.0)
MCV: 87.3 fL (ref 80.0–100.0)
Platelets: 163 10*3/uL (ref 150–400)
RBC: 4.1 MIL/uL — ABNORMAL LOW (ref 4.22–5.81)
RDW: 15.3 % (ref 11.5–15.5)
WBC: 7.9 10*3/uL (ref 4.0–10.5)
nRBC: 0 % (ref 0.0–0.2)

## 2023-05-22 LAB — BASIC METABOLIC PANEL
Anion gap: 7 (ref 5–15)
BUN: 44 mg/dL — ABNORMAL HIGH (ref 8–23)
CO2: 26 mmol/L (ref 22–32)
Calcium: 8.5 mg/dL — ABNORMAL LOW (ref 8.9–10.3)
Chloride: 103 mmol/L (ref 98–111)
Creatinine, Ser: 1.32 mg/dL — ABNORMAL HIGH (ref 0.61–1.24)
GFR, Estimated: 55 mL/min — ABNORMAL LOW (ref >60)
Glucose, Bld: 124 mg/dL — ABNORMAL HIGH (ref 70–99)
Potassium: 3.9 mmol/L (ref 3.5–5.1)
Sodium: 136 mmol/L (ref 135–145)

## 2023-05-22 NOTE — Progress Notes (Signed)
HD#5 SUBJECTIVE:   Overnight Events: NSVT run patient is asymptomatic. Dizzy with Robaxin.  Interim History: He feels a lot better today. Improving. Coughing mucous.   OBJECTIVE:  Vital Signs: Vitals:   05/22/23 0730 05/22/23 0733 05/22/23 0806 05/22/23 1143  BP:   (!) 156/84 (!) 143/80  Pulse:   60   Resp:   18 15  Temp:   98.3 F (36.8 C) 98.4 F (36.9 C)  TempSrc:   Oral Oral  SpO2: 94% 94% 94%   Weight:      Height:        Filed Weights   05/20/23 0353 05/21/23 0438 05/22/23 0410  Weight: 61.1 kg 60.1 kg 60.7 kg     Intake/Output Summary (Last 24 hours) at 05/22/2023 1512 Last data filed at 05/22/2023 1100 Gross per 24 hour  Intake 370 ml  Output 600 ml  Net -230 ml   Net IO Since Admission: -1,357.36 mL [05/22/23 1512]  Physical Exam: Physical Exam Constitutional:      General: He is not in acute distress.    Appearance: He is not toxic-appearing.  Neck:     Vascular: No JVD.  Cardiovascular:     Rate and Rhythm: Normal rate and regular rhythm.     Heart sounds: S1 normal and S2 normal. Heart sounds not distant. No murmur heard.    No friction rub.  Pulmonary:     Effort: No tachypnea, bradypnea, accessory muscle usage or respiratory distress.     Breath sounds: Examination of the left-middle field reveals wheezing. Examination of the left-lower field reveals wheezing. Wheezing present. No decreased breath sounds, rhonchi or rales.  Abdominal:     General: There is no distension.     Tenderness: There is no abdominal tenderness.  Musculoskeletal:     Right lower leg: No edema.     Left lower leg: No edema.  Neurological:     Mental Status: He is alert.    Patient Lines/Drains/Airways Status     Active Line/Drains/Airways     Name Placement date Placement time Site Days   Peripheral IV 05/20/23 20 G Right Antecubital 05/20/23  1712  Antecubital  1   Chest Tube 1 Lateral;Right 05/20/23  --  --  1           CXR showing residual  Pneumothorax- improving  ASSESSMENT/PLAN:  Assessment: Active Problems:   NSVT (nonsustained ventricular tachycardia) (HCC)   Chronic combined systolic and diastolic heart failure (HCC)   Paroxysmal A-fib (HCC)   Plan:  COPD with exacerbation Acute on chronic hypoxic respiratory failure - Continue nebulized triple therapy for now - As needed albuterol - S/p azithromycin and prednisone. -Tussinex for expectoration and mucous suppression. -Will likely discharge on triple therapy -appreciate pharm help with this.   R Spontaneous Pneumothorax  Likely due to COPD on BiPap -S/p chest tube placement, appreciate PCCM -Oxycodone 5mg  q4hrs PRN for pain  NSVT History of same.  ICD in place.  Has had multiple intermittent episodes throughout his hospitalization. These have increased in frequency since yesterday. Likely in the setting of COPD. -cw Metoprolol succinate 75 mg twice daily  -continue to monitor   Prerenal azotemia Orthostatic hypotension Slightly increased BUN/creatinine at baseline. Holding torsemide for now. He has been diuresed and Cr function was increasing (2.01 on 05/19/2023). Was given .5L NS bolus yesterday. He is not fluid overloaded on exam. Cr improved today 1.31 and at baseline. -Continue to monitor with BMP -Not fluid overloaded today, holding  diuresis.  HFmrEF s/p CRT-D Prior Echocardiogram April 2024 LVEF 35-40% with moderate LVH JVD and hepatojugular reflex have resolved. No BLE edema. Pt is not fluid overloaded. Has not gained weight.  [name] / [name] at discharge if renal function permitted.  -Holding home bumex as he is not fluid overloaded -metoprolol as above -Strict I/O  -Daily weights    HTN -Holding Losatan for now due to renal function. 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  -cw metoprolol as above.  -No anticoagulation per  EP   CAD s/p PCI  Continue home medication regimen of Plavix 75gm, Lipitor 80mg , and Aspirin 81mg    Shoulder pain Has improved. -cw gabapentin 100mg  nightly   Best Practice: Diet: Regular diet IVF: Fluids: 0.9NS, Rate:  250 cc bolus VTE: enoxaparin (LOVENOX) injection 30 mg Start: 05/16/23 2200 Code: Full   Signature: [facility]  Internal Medicine Resident, PGY-1 [name]  Pager: [phone] 3:12 PM, 05/22/2023   Please contact the on call pager after 5 pm and on weekends at [phone].

## 2023-05-22 NOTE — Progress Notes (Signed)
   NAME:  [name], MRN:  [id number], DOB:  [birth year], LOS: 5 ADMISSION DATE:  05/16/2023, CONSULTATION DATE:  9/9 REFERRING MD:  IMTS , CHIEF COMPLAINT:  acute pneumothorax   History of Present Illness:  [name] is a 82 y/o gentleman with a history of COPD who presented on 9/4 with acute COPD exacerbation. He has been managed with LAMA, LABA, ICS and suddenly worsened this afternoon after a coughing episode. He has been on BiPAP and steadily growing more SOB. CXR obtained by team showing pneumothorax. PCCM consulted for chest tube placement.    Pertinent  Medical History  COPD NSVT  Significant Hospital Events: Including procedures, antibiotic start and stop dates in addition to other pertinent events   9/5 admitted 9/9 pneumothorax on R> chest tube 9/11 CT placed to waterseal at 0900, repeat CXR planned for 1500  Interim History / Subjective:  Feels much better. CXR my read improved. No air leak currently. He is on room air, comfortable.  Placed CT to water seal and instructed pt on alarming symptoms suggesting recurrent/worsened PTX including but not limited to dyspnea, chest pain, tachycardia, distress, etc.  RN also notified of above along with plan for repeat CXR at 1500. RN asked that if pt develops any concerning symptoms, please order STAT CXR and notify us immediately.  Objective   Blood pressure (!) 156/84, pulse 60, temperature 98.3 F (36.8 C), temperature source Oral, resp. rate 18, height 5\' 5"  (1.651 m), weight 60.7 kg, SpO2 94%.        Intake/Output Summary (Last 24 hours) at 05/22/2023 0911 Last data filed at 05/22/2023 0806 Gross per 24 hour  Intake 617 ml  Output 780 ml  Net -163 ml   Filed Weights   05/20/23 0353 05/21/23 0438 05/22/23 0410  Weight: 61.1 kg 60.1 kg 60.7 kg    Examination: General: adult male, watching TV, in NAD HENT: [place]/AT, eyes anicteric Lungs: Normal effort. CTAB. R chest tube without air leak Heart: RRR, no M/R/G Abdomen:  nondistended Extremities: no cyanosis Neuro: A&O x 3, no deficits Derm: warm, dry   Assessment & Plan:   Acute secondary pneumothorax on the R due to AE COPD - s/p pigtail chest tube placement 9/9. Follow up CXR 9/11 per my read improved. - Change chest tube to water seal now. - Repeat CXR at 1500 and in AM. If lung remains up, can pull chest tube 9/12. - Pt warned about alarming symptoms and instructed to call RN immediately if he were to develop any of them. RN also instructed on such and if so, will order STAT CXR and notify us. - Routine chest tube flushes per protocol. - Oxycodone PRN for pain control.  Acute COPD exacerbation -con't triple inhaled therapy  Primary team notified of plan outlined above. If CXR stable this PM and AM, pull chest tube AM 9/12 and can d/c thereafter.  Best Practice (right click and "Reselect all SmartList Selections" daily)  Per primary   Rutherford Guys, PA - [facility] For pager details, please see AMION or use Epic chat  After 1900, please call [facility] for cross coverage needs 05/22/2023, 9:11 AM

## 2023-05-22 NOTE — TOC Progression Note (Signed)
Transition of Care [facility]) - Progression Note    Patient Details  Name: [name] MRN: [id number] Date of Birth: 26-Jul-1941  Transition of Care [facility]) CM/SW Contact  Graves-Bigelow, Lamar Laundry, RN Phone Number: 05/22/2023, 2:11 PM  Clinical Narrative:   Patient was discussed in progression-patient's discharge held due to increasing shortness of breath. CXR revealed pneumothorax-CT to waterseal in place. Case Manager will continue to follow for additional needs.   Expected Discharge Plan: Home/Self Care Barriers to Discharge: No Barriers Identified  Expected Discharge Plan and Services In-house Referral: Financial Counselor, Clinical Social Work Discharge Planning Services: CM Consult, HF Clinic   Living arrangements for the past 2 months: Single Family Home   Social Determinants of Health (SDOH) Interventions SDOH Screenings   Food Insecurity: No Food Insecurity (05/16/2023)  Housing: Low Risk  (05/16/2023)  Transportation Needs: No Transportation Needs (05/16/2023)  Utilities: Not At Risk (05/16/2023)  Alcohol Screen: Low Risk  (04/19/2023)  Depression (PHQ2-9): Low Risk  (01/14/2023)  Recent Concern: Depression (PHQ2-9) - Medium Risk (11/05/2022)  Financial Resource Strain: High Risk (04/29/2023)  Tobacco Use: Medium Risk (04/29/2023)    Readmission Risk Interventions     No data to display

## 2023-05-22 NOTE — Progress Notes (Signed)
Mobility Specialist Progress Note:   05/22/23 1400  Mobility  Activity Ambulated independently in hallway  Level of Assistance Independent after set-up  Assistive Device None  Distance Ambulated (ft) 940 ft  Activity Response Tolerated well  Mobility Referral Yes  $Mobility charge 1 Mobility  Mobility Specialist Start Time (ACUTE ONLY) 1422  Mobility Specialist Stop Time (ACUTE ONLY) 1433  Mobility Specialist Time Calculation (min) (ACUTE ONLY) 11 min    Pre Mobility: 63 HR, 97% SpO2 During Mobility: 87 HR Post Mobility:  72 HR  Pt received in bed, agreeable to mobility. Voided in BR before ambulating in hallway. Asymptomatic throughout w/ no complaints. Pt left in bed w/ call bell and all needs met.  D'Vante Earlene Plater Mobility Specialist Please contact via Special educational needs teacher or Rehab office at [phone]

## 2023-05-22 NOTE — Plan of Care (Signed)
  Problem: Education: Goal: Knowledge of General Education information will improve Description: Including pain rating scale, medication(s)/side effects and non-pharmacologic comfort measures Outcome: Progressing   Problem: Health Behavior/Discharge Planning: Goal: Ability to manage health-related needs will improve Outcome: Progressing   Problem: Clinical Measurements: Goal: Ability to maintain clinical measurements within normal limits will improve Outcome: Progressing Goal: Will remain free from infection Outcome: Progressing Goal: Diagnostic test results will improve Outcome: Progressing Goal: Respiratory complications will improve Outcome: Progressing Goal: Cardiovascular complication will be avoided Outcome: Progressing   Problem: Activity: Goal: Risk for activity intolerance will decrease Outcome: Progressing   Problem: Nutrition: Goal: Adequate nutrition will be maintained Outcome: Progressing   Problem: Coping: Goal: Level of anxiety will decrease Outcome: Progressing   Problem: Elimination: Goal: Will not experience complications related to bowel motility Outcome: Progressing Goal: Will not experience complications related to urinary retention Outcome: Progressing   Problem: Pain Managment: Goal: General experience of comfort will improve Outcome: Progressing   Problem: Safety: Goal: Ability to remain free from injury will improve Outcome: Progressing   Problem: Skin Integrity: Goal: Risk for impaired skin integrity will decrease Outcome: Progressing   Problem: Education: Goal: Knowledge of General Education information will improve Description: Including pain rating scale, medication(s)/side effects and non-pharmacologic comfort measures Outcome: Progressing   Problem: Health Behavior/Discharge Planning: Goal: Ability to manage health-related needs will improve Outcome: Progressing   Problem: Clinical Measurements: Goal: Ability to maintain  clinical measurements within normal limits will improve Outcome: Progressing Goal: Will remain free from infection Outcome: Progressing Goal: Diagnostic test results will improve Outcome: Progressing Goal: Respiratory complications will improve Outcome: Progressing Goal: Cardiovascular complication will be avoided Outcome: Progressing   Problem: Activity: Goal: Risk for activity intolerance will decrease Outcome: Progressing   Problem: Nutrition: Goal: Adequate nutrition will be maintained Outcome: Progressing   Problem: Coping: Goal: Level of anxiety will decrease Outcome: Progressing   Problem: Elimination: Goal: Will not experience complications related to bowel motility Outcome: Progressing Goal: Will not experience complications related to urinary retention Outcome: Progressing   Problem: Pain Managment: Goal: General experience of comfort will improve Outcome: Progressing   Problem: Safety: Goal: Ability to remain free from injury will improve Outcome: Progressing   Problem: Skin Integrity: Goal: Risk for impaired skin integrity will decrease Outcome: Progressing   Problem: Education: Goal: Knowledge of cardiac device and self-care will improve Outcome: Progressing Goal: Ability to safely manage health related needs after discharge will improve Outcome: Progressing Goal: Individualized Educational Video(s) Outcome: Progressing   Problem: Cardiac: Goal: Ability to achieve and maintain adequate cardiopulmonary perfusion will improve Outcome: Progressing

## 2023-05-23 ENCOUNTER — Telehealth: Payer: Self-pay | Admitting: Pulmonary Disease

## 2023-05-23 ENCOUNTER — Inpatient Hospital Stay (HOSPITAL_COMMUNITY): Payer: Medicare Other

## 2023-05-23 ENCOUNTER — Other Ambulatory Visit (HOSPITAL_COMMUNITY): Payer: Self-pay

## 2023-05-23 ENCOUNTER — Telehealth (HOSPITAL_COMMUNITY): Payer: Self-pay | Admitting: Licensed Clinical Social Worker

## 2023-05-23 LAB — CBC
HCT: 36.3 % — ABNORMAL LOW (ref 39.0–52.0)
Hemoglobin: 11.7 g/dL — ABNORMAL LOW (ref 13.0–17.0)
MCH: 28.2 pg (ref 26.0–34.0)
MCHC: 32.2 g/dL (ref 30.0–36.0)
MCV: 87.5 fL (ref 80.0–100.0)
Platelets: 171 10*3/uL (ref 150–400)
RBC: 4.15 MIL/uL — ABNORMAL LOW (ref 4.22–5.81)
RDW: 15.2 % (ref 11.5–15.5)
WBC: 8.2 10*3/uL (ref 4.0–10.5)
nRBC: 0 % (ref 0.0–0.2)

## 2023-05-23 LAB — BASIC METABOLIC PANEL
Anion gap: 7 (ref 5–15)
BUN: 38 mg/dL — ABNORMAL HIGH (ref 8–23)
CO2: 26 mmol/L (ref 22–32)
Calcium: 8.4 mg/dL — ABNORMAL LOW (ref 8.9–10.3)
Chloride: 104 mmol/L (ref 98–111)
Creatinine, Ser: 1.37 mg/dL — ABNORMAL HIGH (ref 0.61–1.24)
GFR, Estimated: 52 mL/min — ABNORMAL LOW (ref >60)
Glucose, Bld: 99 mg/dL (ref 70–99)
Potassium: 3.9 mmol/L (ref 3.5–5.1)
Sodium: 137 mmol/L (ref 135–145)

## 2023-05-23 MED ORDER — METOPROLOL SUCCINATE ER 25 MG PO TB24
75.0000 mg | ORAL_TABLET | Freq: Two times a day (BID) | ORAL | 2 refills | Status: DC
  Filled 2023-05-23 – 2023-05-24 (×2): qty 180, 30d supply, fill #0

## 2023-05-23 MED ORDER — FLUTICASONE PROPIONATE HFA 110 MCG/ACT IN AERO
2.0000 | INHALATION_SPRAY | Freq: Two times a day (BID) | RESPIRATORY_TRACT | 2 refills | Status: DC
  Filled 2023-05-23 – 2023-05-24 (×2): qty 12, 30d supply, fill #0

## 2023-05-23 NOTE — Progress Notes (Signed)
Patient got frustrated while explaining discharge instructions saying he had told "Everybody" that he needs "a work letter to return full time" which was not include in the discharge papers. Attempted to explain his chest Tube has been only pulled today; that recovery time may be needed; and that work return document will probably be given once deemed safe to return to work after the pulmonologist clinic follow up appointment completed. Refused teaching and not even willing for this writer to reach out to the discharging provider to address his concerns. Irritable and inappropriate; Reports "would not come back to [facility] again" if he has to go to the hospital. Transferred out via wheelchair; alert and oriented at baseline. Mikal Plane, BSN, RN

## 2023-05-23 NOTE — Progress Notes (Signed)
Mobility Specialist Progress Note:   05/23/23 1200  Mobility  Activity Ambulated independently in hallway  Level of Assistance Independent after set-up  Assistive Device None  Distance Ambulated (ft) 940 ft  Activity Response Tolerated well  Mobility Referral Yes  $Mobility charge 1 Mobility  Mobility Specialist Start Time (ACUTE ONLY) 1057  Mobility Specialist Stop Time (ACUTE ONLY) 1105  Mobility Specialist Time Calculation (min) (ACUTE ONLY) 8 min    Pre Mobility: 60 HR During Mobility: 89 HR Post Mobility:  77 HR  Pt received in bed, agreeable to mobility. Asymptomatic throughout w/ no complaints. Pt left in bed with call bell and all needs met.  D'Vante Earlene Plater Mobility Specialist Please contact via Special educational needs teacher or Rehab office at [phone]

## 2023-05-23 NOTE — Telephone Encounter (Signed)
ER dept called to set HFU appt. Only appt avail was in late Oct. Adv I would see if Dr. Isaiah Serge can see sooner. PT will need rads when he comes in for appt so please see if we can put in order and have arrive early if we can indeed resched.  Otherwise appt made, reminder sent to PT. TY.

## 2023-05-23 NOTE — Progress Notes (Signed)
Chest tube removed per Md order. Pt tolerated well, VSS, Vaseline gauze and tegaderm placed. Primary RN notified.

## 2023-05-23 NOTE — Telephone Encounter (Signed)
CSW called pt to follow up on referral to help pt learn his immigration status- unable to reach pt- left VM requesting return call  Burna Sis, LCSW Clinical Social Worker [facility] Desk#: [phone] Cell#: [phone]

## 2023-05-23 NOTE — Discharge Summary (Signed)
Name: [name] MRN: [id number] DOB: 1941-07-13 82 y.o. PCP: Claiborne Rigg, NP  Date of Admission: 05/16/2023  1:46 AM Date of Discharge:  05/23/2023 Attending Physician: Dr. Antony Contras  DISCHARGE DIAGNOSIS:  Primary Problem: COPD exacerbation [facility])   Hospital Problems: Active Problems:   NSVT (nonsustained ventricular tachycardia) (HCC)   Chronic combined systolic and diastolic heart failure (HCC)   Paroxysmal A-fib (HCC)    DISCHARGE MEDICATIONS:   Allergies as of 05/23/2023       Reactions   Acyclovir And Related Other (See Comments)   Pt states he is not familiar with this medication.   Aspirin Other (See Comments)   GI upset at high doses only.        Medication List     STOP taking these medications    Dulera 200-5 MCG/ACT Aero Generic drug: mometasone-formoterol   famotidine 40 MG tablet Commonly known as: Pepcid   losartan 50 MG tablet Commonly known as: COZAAR   potassium chloride 10 MEQ tablet Commonly known as: KLOR-CON M   tiotropium 18 MCG inhalation capsule Commonly known as: SPIRIVA   torsemide 20 MG tablet Commonly known as: DEMADEX       TAKE these medications    albuterol 108 (90 Base) MCG/ACT inhaler Commonly known as: VENTOLIN HFA Inhale 2 puffs into the lungs every 6 (six) hours as needed for wheezing or shortness of breath. What changed: how much to take Notes to patient: Rescue inhaler, take to work and use when short of breath at work   Aspirin Low Dose 81 MG chewable tablet Generic drug: aspirin Chew 1 tablet (81 mg total) by mouth daily. What changed: how much to take Notes to patient: To prevent heart attack   atorvastatin 80 MG tablet Commonly known as: LIPITOR Take 1 tablet (80 mg total) by mouth once daily. Notes to patient: To lower cholesterol   Bevespi Aerosphere 9-4.8 MCG/ACT Aero Generic drug: Glycopyrrolate-Formoterol Inhale 2 puffs into the lungs 2 (two) times daily. Do NOT use more than 2 times  per day Notes to patient: To prevent breathing issues   clopidogrel 75 MG tablet Commonly known as: PLAVIX Take 1 tablet (75 mg total) by mouth daily. Notes to patient: To prevent heart attack   feeding supplement Liqd Take 237 mLs by mouth 2 (two) times daily between meals. Notes to patient: NEW- nutrition supplement. Please purchase at a pharmacy or on Amazon if you'd like   fluticasone 110 MCG/ACT inhaler Commonly known as: FLOVENT HFA Inhale 2 puffs into the lungs 2 (two) times daily. Notes to patient: NEW- Inhaled steroid to prevent breathing issues   gabapentin 100 MG capsule Commonly known as: NEURONTIN Take 1 capsule (100 mg total) by mouth at bedtime as needed (for shoulder pain). Notes to patient: NEW - For shoulder pain. May cause dizziness and drowsiness   guaiFENesin 600 MG 12 hr tablet Commonly known as: Mucinex Take 1 tablet (600 mg total) by mouth 2 (two) times daily. Notes to patient: NEW - To help with cough   hydrALAZINE 50 MG tablet Commonly known as: APRESOLINE Take 1 tablet (50 mg total) by mouth 3 (three) times daily. Notes to patient: NEW - To lower blood pressure    hydrocortisone cream 1 % Apply topically every 6 (six) hours as needed for itching. Notes to patient: For itching   ipratropium-albuterol 0.5-2.5 (3) MG/3ML Soln Commonly known as: DUONEB Take 3 mLs by nebulization every 6 (six) hours as needed (wheezing or shortness  of breath). Notes to patient: For shortness of breath    metoprolol succinate 25 MG 24 hr tablet Commonly known as: TOPROL-XL Take 3 tablets (75 mg total) by mouth 2 (two) times daily. What changed:  how much to take when to take this additional instructions Notes to patient: INCREASED- to prevent fast heart rate    polyvinyl alcohol 1.4 % ophthalmic solution Commonly known as: LIQUIFILM TEARS Place 1 drop into both eyes as needed for dry eyes. Notes to patient: For dry eyes               Discharge Care  Instructions  (From admission, onward)           Start     Ordered   05/23/23 0000  Discharge wound care:       Comments: Cannot shower for 48 hours. Change dressing daily. Keep area clean. Apply vaseline, gauze and tegaderm.   05/23/23 1514            DISPOSITION AND FOLLOW-UP:  [name] was discharged from [facility] in Good condition. At the hospital follow up visit please address:  Follow-up Recommendations: Consults: PCCM- has a follow up appointment with pulmonology for repeat XR for resolution of opacity on CXR Labs: Basic Metabolic Profile Studies: Will need CXR as above Medications: May need to re-start furosemide. ON triple therapy for his COPD now. Patient educated- please ensure adherence to meds.  Follow-up Appointments:  Follow-up Information     Regan Lemming, MD. Go in 7 day(s).   Specialty: Cardiology Why: Appt on 9/17 at 1:45pm Contact information: [address] [place] [address] [phone]         Rocky Morel, DO. Go in 14 day(s).   Specialty: Internal Medicine Why: Follow up Internal Medicine clinic  GROUND FLOOR EAST SIDE  06/07/2023 at 08:15AM   Make sure to come in a little earlier in case you get lost. Contact information: [address] [place] [address] [phone]         [facility] Follow up.   Specialty: Pulmonology Why: You currently have the following appointment: 10/28 11:15   However, you will be called if a sooner time becomes available. Contact information: [address] [place] [address] [phone]                HOSPITAL COURSE:  Patient Summary: COPD with exacerbation Acute on chronic hypoxic respiratory failure Received triple therapy while hospitalized with improvement on his symptoms. He needed nebulizers. Was using Bevespi as rescue prior to his hospitalization. We spent  time educating the patient on what inhalers he would need, but needs close follow up so that we ensure he is using them correct. Wife recently passed away. He is uninsured. Tried to work but limited with hospitalizations. Getting Bevespi through pharmaceutical patient assistance program. Please ensure compliance with the below. We are adding fluticasone now.  -Use Bevespi BID  -Use fluticasone BID  -Use albuterol as rescue   Spontaneous Pneumothorax He was having NSVT runs intermittently during his hospitalization and these espisodes were for the most part asymptomatic. On 05/21/2023 he developed worsening SOB, chest palpitations, and lightheadedness. He was bradycardic and orthostatic on exam, lungs were clear to auscultation. He received .5L of LR with improvement of his symptoms. Shortly after, the nurse paged stating that he was having chest pain, was diaphoretic, and SOB. PVCs on tele.  Troponins were ordered, EKG not significantly changed from admission. The patient was placed on BiPAP with significant improvement on his symptoms. Later that evening, he developed worsening symptoms. Troponins were flat in the 40s. Lung sounds were decreased on the R and had diffused wheezing. CXR showing collapsed right lung/pneumothorax compared to previous CXR on 9/5. PCCM was consulted and a chest tube was placed. He had resolution of his symptoms. Repeat CXRs showed resolution of his pneumothorax but there was a residual opacity where the chest tube was placed. Chest tube was removed on 9/12.  -Please ensure wound is healing well.  -Will need to follow up with Pulm with Dr. Isaiah Serge (appt set, office working on getting earlier appt) for repeat CXR and to ensure that there is resolution of the opacity on his CXR.   NSVT History of same.  ICD in place.  Has had multiple intermittent episodes throughout his hospitalization. Metoprolol was uptitrated to 75mg  BID and helped reduce the frequency of these events.  - cw  Metoprolol succinate 75 mg twice daily   Prerenal azotemia Orthostatic hypotension Slightly increased BUN/creatinine at baseline. Torsemide was held due to worsening kidney function (2.01 on 05/19/2023). Cr improved after a small volume bolus, torsemine held and he remained at baseline. He knows to restart should his feet become edematous or has weight gain of 3lbs in 24 hours or 5 lbs in a week. Dry weight is 133 lbs  -Continue to monitor with BMP   Chronic combined systolic and diastolic heart failure JVD and hepatojugular reflex have resolved. No BLE edema. Pt is not fluid overloaded. Has not gained weight.  [name] / [name] not restarted. Please consider restarting these medications if renal function permits. -Holding home bumex as he is not fluid overloaded, can restart if volume overloaded. -metoprolol as above  HTN -Holding Losatan for now due to renal function. -Hydralazine 50 mg TID  -Continue Metoprolol as above -Continue to monitor BP   Paroxsymal Atrial Fibrillation has defibrillator in place.  -cw metoprolol as above.  -No anticoagulation per EP   CAD s/p PCI  Continue home medication regimen of Plavix 75gm, Lipitor 80mg , and Aspirin 81mg   Shoulder pain Started after his pneumococcal vaccination last month. Has not limited range of motion, but is tingling and has anesthesia over deltoid. This will sometimes radiate down his deltoid. Empty can test is negative. Tylenol does not help. Gabapentin 100mg  nightly started with improvement of his symptoms.  -Start gabapentin 100mg  nightly  Social factors affecting medical care  Unable to follow up with meds, has had two hospitalizations in the last month and a half since his wife passed away. Lives with grandson at home. Unsure about immigration status. He was going to get screened for [facility] but needs assistance obtaining medical care.     DISCHARGE INSTRUCTIONS:   Discharge Instructions     (HEART FAILURE PATIENTS)  Call MD:  Anytime you have any of the following symptoms: 1) 3 pound weight gain in 24 hours or 5 pounds in 1 week 2) shortness of breath, with or without a dry hacking cough 3) swelling in the hands, feet or stomach 4) if you have to sleep on extra pillows at night in order to breathe.   Complete by: As directed    Call MD for:  persistant nausea and vomiting   Complete by: As directed    Call MD for:  redness, tenderness, or signs of infection (pain, swelling, redness, odor or green/yellow discharge around incision site)  Complete by: As directed    Call MD for:  severe uncontrolled pain   Complete by: As directed    Call MD for:  temperature >100.4   Complete by: As directed    Diet - low sodium heart healthy   Complete by: As directed    Discharge wound care:   Complete by: As directed    Cannot shower for 48 hours. Change dressing daily. Keep area clean. Apply vaseline, gauze and tegaderm.   Increase activity slowly   Complete by: As directed       [name], [name] were admitted because you had worsening of your COPD. The following are changes we made to some of your medications:   Please make sure you do the following:    For your COPD: -Use your Bevespi in the AM and PM  -Use fluticasone in the AM and PM -Use albuterol whenever you are short of breath and need immediate relief  -Guaifenesin (Mucinex) 60mg  twice a day as needed for sputum production   -STOP taking duoneb, Spiriva, and dulera.   For your wound on your chest (where you had the chest tube):    -Keep your wound clean, change your dressings daily. Apply vaseline, gauze, and tegaderm.    ----Melene Plan FOR TWO DAYS-----   For pain: Take tylenol up to 1,000 mg three times a day.   Follow up with pulmonology.   For your heart:  -Continue taking metoprolol 75 mg twice a day. -Continue taking aspirin 81mg  daily  -Continue taking Plavix 75mg  daily -Continue taking Atorvastatin 80mg  daily    Your  volume status is currently okay. FOR NOW: -Stop taking Torsemide 0.5 mg daily . You will need to re-start it if:    -You have weight gain more than 3 lbs in a day or more than 5 lbs in a week.   -Worsening lower extremity swelling.   -You will need to follow-up with your heart doctor within a week to check your kidney function.   For your blood pressure:  -Please take 50mg  of Hydralazine three times a day.  -STOP taking your losartan 50mg  daily  -You will need to follow up with your PCP for this as hydralazine is NOT a long term medication for your blood pressure.   For your shoulder pain:  -Continue with 100mg  of Gabapentin at night.  This medication can make you drowsy. Make sure you follow up about this with your primary care doctor.    For your dry eyes:  Use Liquifilm tears 1.4% ophthalmic solution 1 drop in each eye as needed    For your itch:  Use hydrocortisone cream 1% every 6 hours as needed for up to two weeks. DO NOT USE it continuously for more than two weeks.    Sincerely,  Manuela Neptune, MD  PGY-1  Internal Medicine [name]  SUBJECTIVE:   Feels well and is much improved. Wants to go home today.   Discharge Vitals:   BP 136/65   Pulse 92   Temp 98.1 F (36.7 C) (Oral)   Resp 16   Ht 5\' 5"  (1.651 m)   Wt 60.3 kg   SpO2 100%   BMI 22.13 kg/m   OBJECTIVE:  Physical Exam Constitutional:      General: He is not in acute distress.    Appearance: He is not ill-appearing.  Neck:     Vascular: No JVD.  Cardiovascular:     Rate and Rhythm:  Normal rate and regular rhythm.     Heart sounds: S1 normal and S2 normal. No murmur heard.    No friction rub. No gallop.  Pulmonary:     Effort: No tachypnea, bradypnea or respiratory distress.     Breath sounds: Examination of the left-upper field reveals wheezing. Examination of the right-lower field reveals wheezing. Examination of the left-lower field reveals wheezing. Wheezing present. No decreased  breath sounds, rhonchi or rales.  Musculoskeletal:     Right lower leg: No edema.     Left lower leg: No edema.  Neurological:     Mental Status: He is alert.     Pertinent Labs, Studies, and Procedures:     Latest Ref Rng & Units 05/23/2023    5:00 AM 05/22/2023    4:04 AM 05/21/2023    6:57 AM  CBC  WBC 4.0 - 10.5 K/uL 8.2  7.9  10.0   Hemoglobin 13.0 - 17.0 g/dL 63.8  75.6  43.3   Hematocrit 39.0 - 52.0 % 36.3  35.8  35.4   Platelets 150 - 400 K/uL 171  163  174        Latest Ref Rng & Units 05/23/2023    5:00 AM 05/22/2023    4:04 AM 05/21/2023    6:57 AM  CMP  Glucose 70 - 99 mg/dL 99  295  188   BUN 8 - 23 mg/dL 38  44  52   Creatinine 0.61 - 1.24 mg/dL 4.16  6.06  3.01   Sodium 135 - 145 mmol/L 137  136  138   Potassium 3.5 - 5.1 mmol/L 3.9  3.9  4.4   Chloride 98 - 111 mmol/L 104  103  101   CO2 22 - 32 mmol/L 26  26  26    Calcium 8.9 - 10.3 mg/dL 8.4  8.5  9.0     DG Chest 1 View  Result Date: 05/23/2023 CLINICAL DATA:  601093 Pneumothorax [id number] EXAM: CHEST  1 VIEW COMPARISON:  May 23, 2023 FINDINGS: The cardiomediastinal silhouette is unchanged and enlarged in contour.LEFT chest AICD. Removal of RIGHT chest tube. Oval density along the contours of the RIGHT minor fissure laterally likely reflects a loculated pleural effusion. Trace RIGHT apical pneumothorax, not significantly changed in comparison to most recent prior. Scattered linear opacities most consistent with atelectasis. Prominent air-filled loops of bowel in the upper abdomen IMPRESSION: 1. Trace RIGHT apical pneumothorax, not significantly changed in comparison to most recent prior status post RIGHT chest tube removal. 2. Prominent air-filled loops of bowel in the upper abdomen. Electronically Signed   By: Meda Klinefelter M.D.   On: 05/23/2023 14:59   DG Chest Port 1 View  Result Date: 05/23/2023 CLINICAL DATA:  Follow-up pneumothorax. EXAM: PORTABLE CHEST 1 VIEW COMPARISON:  05/22/2023 FINDINGS:  Left chest wall ICD noted with leads in the right atrial appendage, coronary sinus and right ventricle. Stable position of the right-sided chest tube. No significant pneumothorax visualized on the current exam. New opacity adjacent to the chest 2 within the right midlung may reflect fluid along the fissure or atelectasis. Similar scar like opacities in the right base. IMPRESSION: 1. Stable position of right-sided chest tube. No significant pneumothorax visualized on the current exam. 2. New opacity adjacent to the chest tube within the right midlung may reflect fluid along the fissure or atelectasis. Electronically Signed   By: Signa Kell M.D.   On: 05/23/2023 10:04   DG Chest [facility] 1 View  Result  Date: 05/22/2023 CLINICAL DATA:  Shortness of breath.  Pneumothorax. EXAM: PORTABLE CHEST 1 VIEW COMPARISON:  Chest radiograph dated 05/22/2023. FINDINGS: Similar positioning of the right chest tube. There is background of emphysema and chronic interstitial coarsening. Bibasilar atelectasis/scarring. No focal consolidation, or pleural effusion. No detectable pneumothorax. Stable cardiomegaly. Left pectoral AICD device. Atherosclerotic calcification of the aorta. No acute osseous pathology. IMPRESSION: 1. Similar positioning of the right chest tube. No detectable pneumothorax. 2. Emphysema and bibasilar atelectasis/scarring. Electronically Signed   By: Elgie Collard M.D.   On: 05/22/2023 18:59   DG Chest Port 1 View  Result Date: 05/22/2023 CLINICAL DATA:  Pneumothorax EXAM: PORTABLE CHEST 1 VIEW COMPARISON:  CXR 05/21/23 FINDINGS: Left-sided triple lead cardiac device in place with unchanged lead positioning. Right-sided pleural drainage catheter in place with unchanged positioning. There is a small right pleural effusion, new from prior exam. Small right apical pneumothorax, decreased from prior exam. Bibasilar atelectasis. No radiographically apparent displaced rib fractures. Visualized upper abdomen is  unremarkable. Colonic and gastric gaseous distention. IMPRESSION: 1. Small right apical pneumothorax, decreased from prior exam. 2. Small right pleural effusion, new from prior exam. Electronically Signed   By: Lorenza Cambridge M.D.   On: 05/22/2023 10:24   DG CHEST PORT 1 VIEW  Result Date: 05/21/2023 CLINICAL DATA:  Right chest tube in place EXAM: PORTABLE CHEST 1 VIEW COMPARISON:  Chest radiograph 1 day prior FINDINGS: The left chest wall cardiac device and associated leads are stable. The right-sided chest tube is stable in position. The cardiomediastinal silhouette is stable The residual right pneumothorax is unchanged, with apical and inferolateral components. There is no new or worsening focal airspace disease. There is no pleural effusion. There is no left pneumothorax There is no acute osseous abnormality. IMPRESSION: Stable residual right pneumothorax with chest tube in place. Electronically Signed   By: Lesia Hausen M.D.   On: 05/21/2023 10:17   DG CHEST PORT 1 VIEW  Result Date: 05/20/2023 CLINICAL DATA:  Follow-up pneumothorax EXAM: PORTABLE CHEST 1 VIEW COMPARISON:  05/20/2023, 05/16/2023 FINDINGS: Left-sided pacing device as before. Interim placement of right-sided chest tube with pigtail over the mid medial chest. Decreased right pneumothorax with trace right apical and small right CP angle residual pneumothorax. Enlarged cardiomediastinal silhouette with aortic atherosclerosis. IMPRESSION: Interim placement of right-sided chest tube with decreased right pneumothorax. Small residual right apical and CP angle pneumothorax Electronically Signed   By: Jasmine Pang M.D.   On: 05/20/2023 23:51   DG Chest Port 1 View  Addendum Date: 05/20/2023   ADDENDUM REPORT: 05/20/2023 22:52 ADDENDUM: Critical Value/emergent results were called by telephone at the time of interpretation on 05/20/2023 at 10:51 pm to provider Dr. Justin Mend, who verbally acknowledged these results. Electronically Signed   By: Thornell Sartorius M.D.   On: 05/20/2023 22:52   Result Date: 05/20/2023 CLINICAL DATA:  Acute respiratory distress. EXAM: PORTABLE CHEST 1 VIEW COMPARISON:  05/16/2023. FINDINGS: The heart size and mediastinal contours are stable. There is atherosclerotic calcification of the aorta. There is a large right pneumothorax with right lung collapse. Mild atelectasis is noted at the left lung base. A multi lead pacemaker device is present over the left chest. No acute osseous abnormality. IMPRESSION: Large right pneumothorax with right lung collapse. Electronically Signed: By: Thornell Sartorius M.D. On: 05/20/2023 22:33   DG Chest 2 View  Result Date: 05/16/2023 CLINICAL DATA:  Shortness of breath EXAM: CHEST - 2 VIEW COMPARISON:  04/18/2023 FINDINGS: Left AICD remains in place,  unchanged. Cardiomegaly, aortic atherosclerosis. No confluent opacities or effusions. Underlying COPD with hyperinflation. IMPRESSION: COPD. Cardiomegaly. No active disease. Electronically Signed   By: Charlett Nose M.D.   On: 05/16/2023 02:07   CUP PACEART REMOTE DEVICE CHECK  Result Date: 05/14/2023 Scheduled remote reviewed. Normal device function.  3 brief NSVT, no therapy Next remote 91 days. LA, CVRS    Signed: Manuela Neptune, MD Internal Medicine Resident, PGY-1 [name]  Pager: [phone] 3:24 PM, 05/23/2023

## 2023-05-23 NOTE — Progress Notes (Signed)
   NAME:  [name], MRN:  [id number], DOB:  1940-11-23, LOS: 6 ADMISSION DATE:  05/16/2023, CONSULTATION DATE:  9/9 REFERRING MD:  IMTS , CHIEF COMPLAINT:  acute pneumothorax   History of Present Illness:  [name] is a 82 y/o gentleman with a history of COPD who presented on 9/4 with acute COPD exacerbation. He has been managed with LAMA, LABA, ICS and suddenly worsened this afternoon after a coughing episode. He has been on BiPAP and steadily growing more SOB. CXR obtained by team showing pneumothorax. PCCM consulted for chest tube placement.    Pertinent  Medical History  COPD NSVT  Significant Hospital Events: Including procedures, antibiotic start and stop dates in addition to other pertinent events   9/5 admitted 9/9 pneumothorax on R> chest tube 9/11 CT placed to waterseal at 0900, repeat CXR planned for 1500  Interim History / Subjective:   CT remains on water seal. No ptx on chest xray.  Objective   Blood pressure 139/81, pulse 61, temperature 98.1 F (36.7 C), temperature source Oral, resp. rate 16, height 5\' 5"  (1.651 m), weight 60.3 kg, SpO2 100%.        Intake/Output Summary (Last 24 hours) at 05/23/2023 1207 Last data filed at 05/23/2023 0518 Gross per 24 hour  Intake --  Output 568 ml  Net -568 ml   Filed Weights   05/22/23 0410 05/22/23 0733 05/23/23 0500  Weight: 60.7 kg 60.3 kg 60.3 kg    Examination: Gen:      No acute distress HEENT:  EOMI, sclera anicteric Neck:     No masses; no thyromegaly Lungs:    Clear to auscultation bilaterally; normal respiratory effort CV:         Regular rate and rhythm; no murmurs Abd:      + bowel sounds; soft, non-tender; no palpable masses, no distension Ext:    No edema; adequate peripheral perfusion Skin:      Warm and dry; no rash Neuro: alert and oriented x 3 Psych: normal mood and affect   CXR 9/12- No pneumothorax  Assessment & Plan:   Acute secondary pneumothorax on the R due to AE COPD - s/p  pigtail chest tube placement 9/9. Follow up CXR 9/11 per my read improved. - No ptx on water seal, no air leak. Will remove chest tube and get follow up CXR - Oxycodone PRN for pain control.  Acute COPD exacerbation -con't triple inhaled therapy   Best Practice (right click and "Reselect all SmartList Selections" daily)  Per primary  Chilton Greathouse MD [facility] See Amion for pager  If no response to pager , please call [phone] until 7pm After 7:00 pm call Elink  [phone] 05/23/2023, 1:07 PM

## 2023-05-24 ENCOUNTER — Other Ambulatory Visit (HOSPITAL_COMMUNITY): Payer: Self-pay

## 2023-05-24 ENCOUNTER — Other Ambulatory Visit: Payer: Self-pay

## 2023-05-24 ENCOUNTER — Telehealth: Payer: Self-pay | Admitting: Pharmacist

## 2023-05-24 NOTE — Telephone Encounter (Signed)
Hey all,   Spoke with our pharmacy advocate here at [employer]. Pt can get signed up for [facility]. Hospital team started triple therapy during recent admission and wants to maintain him on this as an OP. Zelda was his PCP but I'm not sure if the plan is to transfer care to IM clinic or what. I wanted to give a heads up. I think either triple option is doable so there may be one that is easier to get approval for. Looping in the pharmacy advocate who assists with IM.

## 2023-05-27 ENCOUNTER — Telehealth (HOSPITAL_COMMUNITY): Payer: Self-pay | Admitting: Licensed Clinical Social Worker

## 2023-05-27 ENCOUNTER — Other Ambulatory Visit: Payer: Self-pay

## 2023-05-27 ENCOUNTER — Telehealth: Payer: Self-pay

## 2023-05-27 NOTE — Transitions of Care (Post Inpatient/ED Visit) (Signed)
05/27/2023  Name: [name] MRN: [id number] DOB: 18-Apr-1941  Today's TOC FU Call Status: Today's TOC FU Call Status:: Successful TOC FU Call Completed TOC FU Call Complete Date: 05/27/23 Patient's Name and Date of Birth confirmed.  Transition Care Management Follow-up Telephone Call Date of Discharge: 05/23/23 Discharge Facility: [name]) Type of Discharge: Inpatient Admission Primary Inpatient Discharge Diagnosis:: CPPD exacerbation. How have you been since you were released from the hospital?: Same Any questions or concerns?: Yes Patient Questions/Concerns:: he said he needs a letter from the provider stating he can return to work.  I instructed him to speak to the cardiologist at his appointment tomorrow. Patient Questions/Concerns Addressed: Other: (patient to speak with cardiologist.)  Items Reviewed: Did you receive and understand the discharge instructions provided?: Yes Medications obtained,verified, and reconciled?: No Medications Not Reviewed Reasons:: Other: Reason for Partial Mediation Review: He said he just returned from the pharmacy where he picked up all of his medications.  He was in his car and just returned home so we were not able to review the list.  I instructed him to call me back if he has any questions after reviewing the list and I also instructed him to take his medicatations to his appointment with the cardiologist tomorrow.  He has a nebulizer. Any new allergies since your discharge?: No Dietary orders reviewed?: Yes Type of Diet Ordered:: heart healthy, low sodium.  He has not obtained the feeding supplement Do you have support at home?: Yes People in Home: grandchild(ren) Name of Support/Comfort Primary Source: his grandson stays with him.  Medications Reviewed Today: Medications Reviewed Today   Medications were not reviewed in this encounter     Home Care and Equipment/Supplies: Were Home Health Services Ordered?: No Any new  equipment or medical supplies ordered?: No  Functional Questionnaire: Do you need assistance with bathing/showering or dressing?: No Do you need assistance with meal preparation?: No Do you need assistance with eating?: No Do you have difficulty maintaining continence: No Do you need assistance with getting out of bed/getting out of a chair/moving?: No Do you have difficulty managing or taking your medications?: No  Follow up appointments reviewed: PCP Follow-up appointment confirmed?: Yes Date of PCP follow-up appointment?: 06/07/23 Follow-up Provider: Internal Medicine clinic.  I asked if Internal Med  will be his new PCP or if he wants me to schedule him with Bertram Denver NP.    He stated he wants to see what happens with the cardiologist tomorrow and will call this clinc if he wants an appointment with Ms Meredeth Ide, NP Specialist Hospital Follow-up appointment confirmed?: Yes Date of Specialist follow-up appointment?: 05/28/23 Follow-Up Specialty Provider:: cardiology; pulmonary - 07/08/2023. Do you need transportation to your follow-up appointment?: No Do you understand care options if your condition(s) worsen?: Yes-patient verbalized understanding    SIGNATURE Robyne Peers, RN

## 2023-05-27 NOTE — Telephone Encounter (Signed)
CSW attempted to call pt to check on progress with immigration assistance program- unable to reach- left VM requesting return call  Burna Sis, LCSW Clinical Social Worker [facility] Desk#: [phone] Cell#: [phone]

## 2023-05-28 ENCOUNTER — Ambulatory Visit: Payer: Self-pay | Attending: Cardiology | Admitting: Cardiology

## 2023-05-28 ENCOUNTER — Encounter: Payer: Self-pay | Admitting: Cardiology

## 2023-05-28 ENCOUNTER — Ambulatory Visit
Admission: RE | Admit: 2023-05-28 | Discharge: 2023-05-28 | Disposition: A | Discharge status: Home / Self Care | Payer: Self-pay | Source: Ambulatory Visit | Attending: Cardiology | Admitting: Cardiology

## 2023-05-28 VITALS — BP 149/72 | HR 61 | Ht 65.0 in | Wt 142.0 lb

## 2023-05-28 DIAGNOSIS — R0602 Shortness of breath: Secondary | ICD-10-CM

## 2023-05-28 DIAGNOSIS — J9383 Other pneumothorax: Secondary | ICD-10-CM

## 2023-05-28 DIAGNOSIS — I5022 Chronic systolic (congestive) heart failure: Secondary | ICD-10-CM

## 2023-05-28 DIAGNOSIS — I480 Paroxysmal atrial fibrillation: Secondary | ICD-10-CM

## 2023-05-28 DIAGNOSIS — I4729 Other ventricular tachycardia: Secondary | ICD-10-CM

## 2023-05-28 DIAGNOSIS — I472 Ventricular tachycardia, unspecified: Secondary | ICD-10-CM

## 2023-05-28 NOTE — Patient Instructions (Signed)
Medication Instructions:  Your physician recommends that you continue on your current medications as directed. Please refer to the Current Medication list given to you today.  *If you need a refill on your cardiac medications before your next appointment, please call your pharmacy*   Lab Work: None ordered If you have labs (blood work) drawn today and your tests are completely normal, you will receive your results only by: MyChart Message (if you have MyChart) OR A paper copy in the mail If you have any lab test that is abnormal or we need to change your treatment, we will call you to review the results.   Testing/Procedures: A chest x-ray takes a picture of the organs and structures inside the chest, including the heart, lungs, and blood vessels. This test can show several things, including, whether the heart is enlarges; whether fluid is building up in the lungs; and whether pacemaker / defibrillator leads are still in place.    Follow-Up: At [facility], you and your health needs are our priority.  As part of our continuing mission to provide you with exceptional heart care, we have created designated Provider Care Teams.  These Care Teams include your primary Cardiologist (physician) and Advanced Practice Providers (APPs -  Physician Assistants and Nurse Practitioners) who all work together to provide you with the care you need, when you need it.  We recommend signing up for the patient portal called "MyChart".  Sign up information is provided on this After Visit Summary.  MyChart is used to connect with patients for Virtual Visits (Telemedicine).  Patients are able to view lab/test results, encounter notes, upcoming appointments, etc.  Non-urgent messages can be sent to your provider as well.   To learn more about what you can do with MyChart, go to [email or web address].    Remote monitoring is used to monitor your Pacemaker or ICD from home. This monitoring reduces the number of  office visits required to check your device to one time per year. It allows [place] to keep an eye on the functioning of your device to ensure it is working properly. You are scheduled for a device check from home on 08/09/2023. You may send your transmission at any time that day. If you have a wireless device, the transmission will be sent automatically. After your physician reviews your transmission, you will receive a postcard with your next transmission date.  Your next appointment:   6 month(s)  The format for your next appointment:   In Person  Provider:   You will see one of the following Advanced Practice Providers on your designated Care Team:   Francis Dowse, [place]" [place], [place] Canary Brim, NP  {   Thank you for choosing [facility]!!   Dory Horn, RN [phone]

## 2023-05-28 NOTE — Progress Notes (Signed)
Electrophysiology Office Note:   Date:  05/28/2023  ID:  [name], DOB [birth year], MRN [id number]  Primary Cardiologist: Peter [place], MD Electrophysiologist: Regan Lemming, MD      History of Present Illness:   [name] is a 82 y.o. male with h/o coronary artery disease, chronic systolic heart failure, COPD, hypertension, hyperlipidemia, SCAF seen today for post hospital follow up.    Admitted 05/16/23 for a COPD exacerbation.  He began having increasing burden of nonsustained VT.  Metoprolol was increased.  During a prior admission to the hospital, he was noted to have episodes of VT.  He had a left heart catheterization that showed an LAD lesion which has now been stented.  Since discharge from hospital the patient was initially feeling well, but has unfortunately started feeling pain in the right side of his chest.  He states that he is mildly short of breath, but he feels that the pain is limiting chest expansion.  He did have a chest tube due to a pneumothorax which was pulled out 5 days ago.  he denies chest pain, palpitations, dyspnea, PND, orthopnea, nausea, vomiting, dizziness, syncope, edema, weight gain, or early satiety.   Review of systems complete and found to be negative unless listed in HPI.   EP Information / Studies Reviewed:    EKG is ordered today. Personal review as below.  EKG Interpretation Date/Time:  Tuesday May 28 2023 14:13:55 EDT Ventricular Rate:  64 PR Interval:    QRS Duration:  148 QT Interval:  460 QTC Calculation: 474 R Axis:   -86  Text Interpretation: AV dual-paced rhythm with occasional Premature ventricular complexes Biventricular pacemaker detected When compared with ECG of 28-May-2023 14:12, Premature ventricular complexes are now Present Vent. rate has decreased BY   2 BPM Confirmed by Hassan Blackshire ([id number]) on 05/28/2023 2:26:53 PM     Risk Assessment/Calculations:    CHA2DS2-VASc Score = 7   This indicates a 11.2%  annual risk of stroke. The patient's score is based upon: CHF History: 1 HTN History: 1 Diabetes History: 0 Stroke History: 2 Vascular Disease History: 1 Age Score: 2 Gender Score: 0      Physical Exam:   VS:  BP (!) 149/72 (BP Location: Right Arm, Patient Position: Sitting, Cuff Size: Normal)   Pulse 61   Ht 5\' 5"  (1.651 m)   Wt 142 lb (64.4 kg)   SpO2 98%   BMI 23.63 kg/m    Wt Readings from Last 3 Encounters:  05/28/23 142 lb (64.4 kg)  05/23/23 133 lb (60.3 kg)  04/29/23 143 lb 6.4 oz (65 kg)     GEN: Well nourished, well developed in no acute distress NECK: No JVD; No carotid bruits CARDIAC: Regular rate and rhythm, no murmurs, rubs, gallops RESPIRATORY:  Clear to auscultation without rales, wheezing or rhonchi  ABDOMEN: Soft, non-tender, non-distended EXTREMITIES:  No edema; No deformity   ASSESSMENT AND PLAN:    1.  Chronic systolic heart failure: Due to ischemic cardiomyopathy.  Post Medtronic ICD.  Device functioning appropriately.  Was minimally pacing off of his LV lead.  Vector has since been adjusted and now ventricular pacing off of his LV lead.  Edward Trevino continue with current management.  2.  Coronary artery disease: No current chest pain.  Continue Plavix, beta-blocker, statin, nitrates per primary cardiology.  3.  Hypertension: Mildly elevated but usually well-controlled  4.  Subclinical atrial fibrillation: Minimal episodes.  Not anticoagulated.  5.  Ventricular tachycardia: Has  had multiple episodes of nonsustained VT. did have VT yesterday.  Kolbi Altadonna reassess now that ICD has been optimized.  6.  Shortness of breath with chest pain: Likely caused by his recent chest tube.  To further evaluate, Elius Etheredge plan for chest x-ray  Follow up with EP APP in 6 months  Signed, Salvatore Poe Jorja Loa, MD

## 2023-05-29 ENCOUNTER — Telehealth: Payer: Self-pay | Admitting: Nurse Practitioner

## 2023-05-29 ENCOUNTER — Telehealth: Payer: Self-pay | Admitting: Cardiology

## 2023-05-29 NOTE — Telephone Encounter (Signed)
Pt given CXR findings, per Camn[place] review. Patient appreciates the information. States he feels much improved today, than we when saw him yesterday.  He took a fluid pill & ibuprofen last night and has no pain today.

## 2023-05-29 NOTE — Telephone Encounter (Signed)
Follow Up:       Patient is calling to see if his Chest Xray results are  ready from yesterday(05-28-23)?

## 2023-05-29 NOTE — Telephone Encounter (Signed)
Patient is calling again.

## 2023-05-31 NOTE — Telephone Encounter (Signed)
Appt made. NFN

## 2023-06-03 ENCOUNTER — Other Ambulatory Visit (HOSPITAL_COMMUNITY): Payer: Self-pay

## 2023-06-07 ENCOUNTER — Other Ambulatory Visit: Payer: Self-pay

## 2023-06-07 ENCOUNTER — Other Ambulatory Visit (HOSPITAL_COMMUNITY): Payer: Self-pay

## 2023-06-07 ENCOUNTER — Ambulatory Visit (INDEPENDENT_AMBULATORY_CARE_PROVIDER_SITE_OTHER): Payer: Self-pay | Admitting: Student

## 2023-06-07 ENCOUNTER — Ambulatory Visit (HOSPITAL_COMMUNITY)
Admission: RE | Admit: 2023-06-07 | Discharge: 2023-06-07 | Disposition: A | Discharge status: Home / Self Care | Payer: Self-pay | Source: Ambulatory Visit | Attending: Internal Medicine | Admitting: Internal Medicine

## 2023-06-07 ENCOUNTER — Encounter: Payer: Self-pay | Admitting: Student

## 2023-06-07 VITALS — BP 161/66 | HR 65 | Temp 97.8°F | Wt 142.9 lb

## 2023-06-07 DIAGNOSIS — I25118 Atherosclerotic heart disease of native coronary artery with other forms of angina pectoris: Secondary | ICD-10-CM

## 2023-06-07 DIAGNOSIS — I480 Paroxysmal atrial fibrillation: Secondary | ICD-10-CM | POA: Insufficient documentation

## 2023-06-07 DIAGNOSIS — I2510 Atherosclerotic heart disease of native coronary artery without angina pectoris: Secondary | ICD-10-CM

## 2023-06-07 DIAGNOSIS — I10 Essential (primary) hypertension: Secondary | ICD-10-CM

## 2023-06-07 DIAGNOSIS — I4729 Other ventricular tachycardia: Secondary | ICD-10-CM

## 2023-06-07 DIAGNOSIS — M25512 Pain in left shoulder: Secondary | ICD-10-CM

## 2023-06-07 DIAGNOSIS — R9431 Abnormal electrocardiogram [ECG] [EKG]: Secondary | ICD-10-CM | POA: Insufficient documentation

## 2023-06-07 DIAGNOSIS — K219 Gastro-esophageal reflux disease without esophagitis: Secondary | ICD-10-CM

## 2023-06-07 DIAGNOSIS — I493 Ventricular premature depolarization: Secondary | ICD-10-CM | POA: Insufficient documentation

## 2023-06-07 DIAGNOSIS — H04203 Unspecified epiphora, bilateral lacrimal glands: Secondary | ICD-10-CM

## 2023-06-07 DIAGNOSIS — I110 Hypertensive heart disease with heart failure: Secondary | ICD-10-CM

## 2023-06-07 DIAGNOSIS — D649 Anemia, unspecified: Secondary | ICD-10-CM

## 2023-06-07 DIAGNOSIS — I5042 Chronic combined systolic (congestive) and diastolic (congestive) heart failure: Secondary | ICD-10-CM

## 2023-06-07 DIAGNOSIS — J4489 Other specified chronic obstructive pulmonary disease: Secondary | ICD-10-CM

## 2023-06-07 DIAGNOSIS — I472 Ventricular tachycardia, unspecified: Secondary | ICD-10-CM

## 2023-06-07 MED ORDER — BEVESPI AEROSPHERE 9-4.8 MCG/ACT IN AERO
2.0000 | INHALATION_SPRAY | Freq: Two times a day (BID) | RESPIRATORY_TRACT | 3 refills | Status: DC
Start: 2023-06-07 — End: 2023-07-08
  Filled 2023-06-07: qty 10.7, fill #0

## 2023-06-07 MED ORDER — HYDRALAZINE HCL 50 MG PO TABS
50.0000 mg | ORAL_TABLET | Freq: Three times a day (TID) | ORAL | 3 refills | Status: DC
  Filled 2023-06-07: qty 90, 30d supply, fill #0

## 2023-06-07 MED ORDER — ASPIRIN 81 MG PO CHEW
81.0000 mg | CHEWABLE_TABLET | Freq: Every day | ORAL | 0 refills | Status: DC
  Filled 2023-06-07: qty 90, 90d supply, fill #0

## 2023-06-07 MED ORDER — GABAPENTIN 100 MG PO CAPS
200.0000 mg | ORAL_CAPSULE | Freq: Every evening | ORAL | 1 refills | Status: DC | PRN
  Filled 2023-06-07: qty 60, 30d supply, fill #0

## 2023-06-07 MED ORDER — TORSEMIDE 20 MG PO TABS
20.0000 mg | ORAL_TABLET | Freq: Every day | ORAL | 11 refills | Status: DC
Start: 2023-06-07 — End: 2023-07-26
  Filled 2023-06-07: qty 30, 30d supply, fill #0

## 2023-06-07 MED ORDER — FAMOTIDINE 40 MG PO TABS
40.0000 mg | ORAL_TABLET | Freq: Every evening | ORAL | 3 refills | Status: DC
Start: 2023-06-07 — End: 2023-07-26
  Filled 2023-06-07: qty 30, 30d supply, fill #0

## 2023-06-07 MED ORDER — POLYVINYL ALCOHOL 1.4 % OP SOLN
1.0000 [drp] | OPHTHALMIC | 0 refills | Status: DC | PRN
Start: 2023-06-07 — End: 2023-07-22
  Filled 2023-06-07: qty 15, fill #0

## 2023-06-07 NOTE — Patient Instructions (Addendum)
Thank you, Mr.Jantz A Hipolito, for allowing [place] to provide your care today. Today we discussed . . .  > COPD       -Please take your Bevespi and Flovent in the morning and at night.  Use your albuterol inhaler and DuoNeb nebulizers as needed.  You do not need the Dulera at this time. > Hypertension and heart failure       -Please take the medications as prescribed and the list in this packet.  You will take hydralazine 50 mg 3 times a day and metoprolol 75 mg twice a day.  We are checking your electrolytes and kidney function today.  If your kidney function is stable from the hospital we will think about adding back your losartan but I will call you and discuss this on the phone.  Do not start taking your losartan until we have talked about it.  Please continue to take your blood pressures at home and make sure you get a working scale so you can check your weights.  If your weight goes up by 3 pounds in 1 day or 5 pounds in 1 week you may take 1 dose of your torsemide 20 mg.  You may also take a dose if you feel short of breath or have increased swelling.  If you are taking this medication more than 4 times a week please let us know.  I have ordered the following labs for you:  Lab Orders         BMP8+Anion Gap         CBC with Diff       Referrals ordered today:   Referral Orders         Ambulatory referral to Ophthalmology        I have ordered the following medication/changed the following medications:   Stop the following medications: Medications Discontinued During This Encounter  Medication Reason   Glycopyrrolate-Formoterol (BEVESPI AEROSPHERE) 9-4.8 MCG/ACT AERO Reorder   aspirin 81 MG chewable tablet Reorder   gabapentin (NEURONTIN) 100 MG capsule    hydrALAZINE (APRESOLINE) 50 MG tablet Reorder   guaiFENesin (MUCINEX) 600 MG 12 hr tablet    polyvinyl alcohol (LIQUIFILM TEARS) 1.4 % ophthalmic solution Reorder     Start the following medications: Meds ordered this  encounter  Medications   aspirin 81 MG chewable tablet    Sig: Chew 1 tablet (81 mg total) by mouth daily.    Dispense:  90 tablet    Refill:  0    full OTC bottle   gabapentin (NEURONTIN) 100 MG capsule    Sig: Take 2 capsules (200 mg total) by mouth at bedtime as needed (for shoulder pain).    Dispense:  60 capsule    Refill:  1    (change to [place] for MATCH to process)   Glycopyrrolate-Formoterol (BEVESPI AEROSPHERE) 9-4.8 MCG/ACT AERO    Sig: Inhale 2 puffs into the lungs 2 (two) times daily. Do NOT use more than 2 times per day    Dispense:  10.7 g    Refill:  3   hydrALAZINE (APRESOLINE) 50 MG tablet    Sig: Take 1 tablet (50 mg total) by mouth 3 (three) times daily.    Dispense:  90 tablet    Refill:  3    ok to change to 90 tabs per MD/Lydia C/maf, (change to Guilloud for MATCH to process)   torsemide (DEMADEX) 20 MG tablet    Sig: Take 1 tablet (20 mg total)  by mouth daily.    Dispense:  30 tablet    Refill:  11   famotidine (PEPCID) 40 MG tablet    Sig: Take 1 tablet (40 mg total) by mouth every evening.    Dispense:  30 tablet    Refill:  3   polyvinyl alcohol (LIQUIFILM TEARS) 1.4 % ophthalmic solution    Sig: Place 1 drop into both eyes as needed for dry eyes.    Dispense:  15 mL    Refill:  0      Follow up: 3-4 months    Remember:     Should you have any questions or concerns please call the internal medicine clinic at [phone].     Rocky Morel, DO [facility]

## 2023-06-07 NOTE — Progress Notes (Unsigned)
CC: Hospital Follow Up   HPI:  [name] is a 82 y.o. 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 Please see assessment and plan below for further details.  Past Medical History:  Diagnosis Date   Acute renal failure (HCC) 05/31/2014   Angina decubitus 06/15/2014   CAD (coronary artery disease)    a. s/p stent RCA DES 9/09; b. 2014 Attempted PCI of OM1 @ [place];  c. 04/2014 Cath: LAD 40-50p, D1 95-99 (chronic), LCX 30-40 inf branch, OM1 CTO, RCA 30-40p, RCA patent stent, EF 35%->Med Rx.   Chronic combined systolic and diastolic CHF (congestive heart failure) (HCC)    a. EF about 40 to 45% per echo in April 2013;  b. 04/2014 Echo: EF 20-25%, sev LVH, sev glob HK, Gr 1 DD, mildly reduced RV fxn, PASP . c. 01/2017: EF improved to 50-55%.    Chronic low back pain    Complete heart block (HCC)    COPD (chronic obstructive pulmonary disease) (HCC) dx 06/2013   PFTs 07/08/13: mod obst with resp to bronchodilator, moderate decrease diffusion, airtrapping   COPD with acute exacerbation (HCC) 01/17/2017   COPD with asthma    07/08/13 PFT: FEV1 1.74L (66% pred, 30% change with BD), mod obst with resp to bronchodilator, moderate decrease diffusion, air-trapping 11/2013 Simple spiro>> clear obstruction, FEV1 1.30 L (47% pred) - trial of symbicort 160 2bid 01/26/15      Depression 09/15/2018   Assessment: Increased sadness and depression since losing job  Plan: Patient denies suicidal and homicidal ideations Patient would not like to start medication therapies at this time Will establish patient with community health and wellness for primary care   Dyslipidemia    a. on statin   Essential hypertension    HLD (hyperlipidemia)    HTN (hypertension)    a. Reports intolerance to hydralazine; b. no beta  blockers 2/2 bradycardia;  c. failed on ACE and ARB.   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 04/2014 Echo: EF 20-25%, sev glob HK.   Mixed Ischemic/Non-Ischemic Cardiomyopathy    probable mixed ischemic and non-ischemic    Noncompliance    OSA (obstructive sleep apnea) 04/18/2018   06/11/16 - home sleep study shows AHI of 2.9 an hour with the lowest SaO2 of 79% with an average of 93%  03/08/2018-Home sleep study-AHI 7/HR, SaO2 low 81%    Paroxysmal atrial fibrillation (HCC)    a. identified on device interrogation 01/2016   Pneumonia 09/02/2013   Pulmonary edema 05/10/2014   Respiratory arrest (HCC) 05/18/2014   Sinus bradycardia 05/31/2014   Ventricular tachycardia (HCC)    treated with ATP,  CL 250-300 msec    Current Outpatient Medications  Medication Instructions   albuterol (VENTOLIN HFA) 108 (90 Base) MCG/ACT inhaler 2 puffs, Inhalation, Every 6 hours PRN   aspirin 81 mg, Oral, Daily   atorvastatin (LIPITOR) 80 MG tablet Take 1 tablet (80 mg total) by mouth once daily.   clopidogrel (PLAVIX) 75 mg, Oral, Daily   famotidine (PEPCID) 40 mg, Oral, Every evening   feeding supplement (ENSURE ENLIVE / ENSURE PLUS) LIQD 237 mLs, Oral, 2 times daily  between meals   fluticasone (FLOVENT HFA) 110 MCG/ACT inhaler 2 puffs, Inhalation, 2 times daily   gabapentin (NEURONTIN) 200 mg, Oral, At bedtime PRN   Glycopyrrolate-Formoterol (BEVESPI AEROSPHERE) 9-4.8 MCG/ACT AERO 2 puffs, Inhalation, 2 times daily, Do NOT use more than 2 times per day   hydrALAZINE (APRESOLINE) 50 mg, Oral, 3 times daily   hydrocortisone cream 1 % Topical, Every 6 hours PRN   ipratropium-albuterol (DUONEB) 0.5-2.5 (3) MG/3ML SOLN 3 mLs, Nebulization, Every 6 hours PRN   metoprolol succinate (TOPROL-XL) 75 mg, Oral, 2 times daily   polyvinyl alcohol (LIQUIFILM TEARS)  1.4 % ophthalmic solution 1 drop, Both Eyes, As needed   torsemide (DEMADEX) 20 mg, Oral, Daily     Review of Systems:   Pertinent items noted in HPI and/or A&P.  Physical Exam:  Vitals:   06/07/23 0818  BP: (!) 161/66  Pulse: 65  Temp: 97.8 F (36.6 C)  TempSrc: Oral  SpO2: 100%  Weight: 142 lb 14.4 oz (64.8 kg)    Constitutional: Well-appearing elderly male. In no acute distress. HEENT: Normocephalic, atraumatic, Sclera non-icteric, PERRL, EOM intact Cardio:Regular rate and rhythm with occasional PVCs. 2+ bilateral radial pulses. Pulm:Clear to auscultation bilaterally. Normal work of breathing on room air. Abdomen: Soft, non-tender, non-distended, positive bowel sounds. ZOX:[id number] for extremity edema.  Normal range of motion both actively and passively in the left shoulder, no glenohumeral/AC/[place] joint tenderness to palpation, strength and sensation intact in the left upper extremity. Skin:Warm and dry. Neuro:Alert and oriented x3. No focal deficit noted. Psych:Pleasant mood and affect.   Assessment & Plan:   Chronic combined systolic and diastolic heart failure (HCC) Most recent echocardiogram from 03/30/2023 shows LVEF of 40 to 45% with mildly decreased LV function and global hypokinesis along with severe concentric LVH and mildly elevated pulmonary artery systolic pressure.  He continues to follow with cardiology and last saw them on 05/28/2023 where his ICD was adjusted to improve LV pacing.  He was having some increased VT events prior to having a mid LAD stent placed in 12/2022 and during his recent hospitalization was having this occur again.  On discharge metoprolol succinate was increased to 75 mg BID.  No medication changes were made at his cardiology follow-up. - Continue hydralazine 50 mg 3 times daily, metoprolol succinate 75 mg twice daily, torsemide 20 mg daily as needed - Continue to follow-up with cardiology  NSVT (nonsustained ventricular tachycardia)  (HCC) Implantable CRT-D adjusted at cardiology visit 05/28/2023 for improved RV pacing.  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  COPD with asthma [facility]) Patient presents for hospital follow-up after COPD exacerbation.  On discharge he was sent home with triple therapy but unfortunately had some confusion with his medications.  He has been taking Bevespi and Dulera as needed at home as well as his as needed DuoNebs.  He has not had any significant respiratory issues since leaving the hospital but did have some right-sided chest pain when he saw his cardiologist on 05/28/2023 and chest x-ray at that time did show some mildly increased loculated pleural effusion in the right lung and patchy density in the right lower lung but no recurrence of his pneumothorax. - Continue Bevespi and Flovent twice daily with albuterol and DuoNebs as needed  CAD (coronary artery disease) S/p DES PCI to the mid LAD in 12/2022.  Continues on DAPT which from recent cardiology note recommended indefinite therapy.  Will continue to work with cardiology for titration of DAPT and possible.  Currently no significant bleeding risk outweigh the benefits of DAPT. - Continue aspirin 81 mg daily and Plavix 75 mg daily - Continue atorvastatin 80 mg daily  Essential hypertension Blood pressure remains elevated today at 161/66.  Losartan was held from recent discharge and he continues on hydralazine 50 mg 3 times daily and metoprolol succinate 75 mg twice daily.  He has not been taking his medications exactly correctly and may only be taking metoprolol 25 mg daily or twice a day.  We will get a BMP today and he will be checking his blood pressures at home.  We will discuss resuming losartan based on stable or improved renal function and home blood pressures.  Of note he has been on amlodipine in the past but this was stopped for an unknown reason so this could be an option if needed in the future. - Continue hydralazine 50  mg 3 times daily and metoprolol succinate 75 mg twice daily - BMP today and discussed resuming losartan based on these results and home blood pressures  Left shoulder pain Patient continues to have moderately bothersome left shoulder pain without decreased range of motion or arm weakness or other radicular symptoms.  Pain is mostly intermittent and has been mildly improved by 100 mg gabapentin at bedtime.  Exam today still consistent with no structural abnormality, normal range of motion, and normal neurologic function.  We will trial increase of gabapentin to 200 mg at night as needed.  Paroxysmal A-fib [facility]) Patient with subclinical A-fib not on anticoagulation.  On exam it is difficult to tell if he is having frequent PVCs or is in atrial fibrillation.  EKG is consistent with prior and shows paced rhythm with PVCs.  We will continue to monitor and not start anticoagulation today.    Patient discussed with Dr. Kris Mouton, DO [facility] Center Internal Medicine Resident PGY-2 Clinic Phone: [phone] Pager: [phone]

## 2023-06-08 LAB — CBC WITH DIFFERENTIAL/PLATELET
Basophils Absolute: 0 10*3/uL (ref 0.0–0.2)
Basos: 0 %
EOS (ABSOLUTE): 0.1 10*3/uL (ref 0.0–0.4)
Eos: 2 %
Hematocrit: 34.3 % — ABNORMAL LOW (ref 37.5–51.0)
Hemoglobin: 10.6 g/dL — ABNORMAL LOW (ref 13.0–17.7)
Immature Grans (Abs): 0 10*3/uL (ref 0.0–0.1)
Immature Granulocytes: 0 %
Lymphocytes Absolute: 2.3 10*3/uL (ref 0.7–3.1)
Lymphs: 43 %
MCH: 27.5 pg (ref 26.6–33.0)
MCHC: 30.9 g/dL — ABNORMAL LOW (ref 31.5–35.7)
MCV: 89 fL (ref 79–97)
Monocytes Absolute: 0.4 10*3/uL (ref 0.1–0.9)
Monocytes: 8 %
Neutrophils Absolute: 2.6 10*3/uL (ref 1.4–7.0)
Neutrophils: 47 %
Platelets: 142 10*3/uL — ABNORMAL LOW (ref 150–450)
RBC: 3.86 x10E6/uL — ABNORMAL LOW (ref 4.14–5.80)
RDW: 13.4 % (ref 11.6–15.4)
WBC: 5.4 10*3/uL (ref 3.4–10.8)

## 2023-06-08 LAB — BMP8+ANION GAP
Anion Gap: 16 mmol/L (ref 10.0–18.0)
BUN/Creatinine Ratio: 15 (ref 10–24)
BUN: 18 mg/dL (ref 8–27)
CO2: 26 mmol/L (ref 20–29)
Calcium: 9.1 mg/dL (ref 8.6–10.2)
Chloride: 106 mmol/L (ref 96–106)
Creatinine, Ser: 1.21 mg/dL (ref 0.76–1.27)
Glucose: 78 mg/dL (ref 70–99)
Potassium: 3.8 mmol/L (ref 3.5–5.2)
Sodium: 148 mmol/L — ABNORMAL HIGH (ref 134–144)
eGFR: 61 mL/min/{1.73_m2} (ref >59)

## 2023-06-10 ENCOUNTER — Telehealth (HOSPITAL_COMMUNITY): Payer: Self-pay | Admitting: Licensed Clinical Social Worker

## 2023-06-10 NOTE — Telephone Encounter (Signed)
CSW attempted to follow up with pt regarding immigration status and financial concerns- unable to reach- left VM requesting return call  Burna Sis, LCSW Clinical Social Worker [facility] Desk#: [phone] Cell#: [phone]

## 2023-06-11 ENCOUNTER — Other Ambulatory Visit: Payer: Self-pay

## 2023-06-12 ENCOUNTER — Emergency Department (HOSPITAL_COMMUNITY): Payer: Self-pay

## 2023-06-12 ENCOUNTER — Other Ambulatory Visit: Payer: Self-pay

## 2023-06-12 ENCOUNTER — Observation Stay (HOSPITAL_COMMUNITY)
Admission: EM | Admit: 2023-06-12 | Discharge: 2023-06-13 | Disposition: A | Discharge status: Home / Self Care | Payer: Self-pay | Attending: Internal Medicine | Admitting: Internal Medicine

## 2023-06-12 ENCOUNTER — Encounter (HOSPITAL_COMMUNITY): Payer: Self-pay | Admitting: Internal Medicine

## 2023-06-12 DIAGNOSIS — J441 Chronic obstructive pulmonary disease with (acute) exacerbation: Secondary | ICD-10-CM | POA: Insufficient documentation

## 2023-06-12 DIAGNOSIS — J449 Chronic obstructive pulmonary disease, unspecified: Secondary | ICD-10-CM

## 2023-06-12 DIAGNOSIS — J181 Lobar pneumonia, unspecified organism: Secondary | ICD-10-CM | POA: Insufficient documentation

## 2023-06-12 DIAGNOSIS — I480 Paroxysmal atrial fibrillation: Secondary | ICD-10-CM | POA: Insufficient documentation

## 2023-06-12 DIAGNOSIS — J189 Pneumonia, unspecified organism: Principal | ICD-10-CM

## 2023-06-12 DIAGNOSIS — I25119 Atherosclerotic heart disease of native coronary artery with unspecified angina pectoris: Secondary | ICD-10-CM

## 2023-06-12 DIAGNOSIS — Z79899 Other long term (current) drug therapy: Secondary | ICD-10-CM | POA: Insufficient documentation

## 2023-06-12 DIAGNOSIS — Z7982 Long term (current) use of aspirin: Secondary | ICD-10-CM | POA: Insufficient documentation

## 2023-06-12 DIAGNOSIS — I5042 Chronic combined systolic (congestive) and diastolic (congestive) heart failure: Secondary | ICD-10-CM | POA: Insufficient documentation

## 2023-06-12 DIAGNOSIS — I110 Hypertensive heart disease with heart failure: Secondary | ICD-10-CM | POA: Insufficient documentation

## 2023-06-12 DIAGNOSIS — R079 Chest pain, unspecified: Secondary | ICD-10-CM | POA: Diagnosis present

## 2023-06-12 DIAGNOSIS — Z950 Presence of cardiac pacemaker: Secondary | ICD-10-CM | POA: Insufficient documentation

## 2023-06-12 DIAGNOSIS — Z7901 Long term (current) use of anticoagulants: Secondary | ICD-10-CM | POA: Insufficient documentation

## 2023-06-12 DIAGNOSIS — Z1152 Encounter for screening for COVID-19: Secondary | ICD-10-CM | POA: Insufficient documentation

## 2023-06-12 DIAGNOSIS — I2510 Atherosclerotic heart disease of native coronary artery without angina pectoris: Secondary | ICD-10-CM | POA: Insufficient documentation

## 2023-06-12 DIAGNOSIS — J45909 Unspecified asthma, uncomplicated: Secondary | ICD-10-CM | POA: Insufficient documentation

## 2023-06-12 DIAGNOSIS — D500 Iron deficiency anemia secondary to blood loss (chronic): Secondary | ICD-10-CM | POA: Insufficient documentation

## 2023-06-12 DIAGNOSIS — D696 Thrombocytopenia, unspecified: Secondary | ICD-10-CM | POA: Insufficient documentation

## 2023-06-12 DIAGNOSIS — R0789 Other chest pain: Principal | ICD-10-CM | POA: Insufficient documentation

## 2023-06-12 DIAGNOSIS — N1831 Chronic kidney disease, stage 3a: Secondary | ICD-10-CM | POA: Insufficient documentation

## 2023-06-12 DIAGNOSIS — M25512 Pain in left shoulder: Secondary | ICD-10-CM | POA: Insufficient documentation

## 2023-06-12 LAB — RESP PANEL BY RT-PCR (RSV, FLU A&B, COVID)  RVPGX2
Influenza A by PCR: NEGATIVE
Influenza B by PCR: NEGATIVE
Resp Syncytial Virus by PCR: NEGATIVE
SARS Coronavirus 2 by RT PCR: NEGATIVE

## 2023-06-12 LAB — CBC
HCT: 32.2 % — ABNORMAL LOW (ref 39.0–52.0)
HCT: 30.6 % — ABNORMAL LOW (ref 39.0–52.0)
Hemoglobin: 9.7 g/dL — ABNORMAL LOW (ref 13.0–17.0)
Hemoglobin: 9.3 g/dL — ABNORMAL LOW (ref 13.0–17.0)
MCH: 26.9 pg (ref 26.0–34.0)
MCH: 27 pg (ref 26.0–34.0)
MCHC: 30.1 g/dL (ref 30.0–36.0)
MCHC: 30.4 g/dL (ref 30.0–36.0)
MCV: 89.4 fL (ref 80.0–100.0)
MCV: 88.7 fL (ref 80.0–100.0)
Platelets: 121 10*3/uL — ABNORMAL LOW (ref 150–400)
Platelets: 112 10*3/uL — ABNORMAL LOW (ref 150–400)
RBC: 3.6 MIL/uL — ABNORMAL LOW (ref 4.22–5.81)
RBC: 3.45 MIL/uL — ABNORMAL LOW (ref 4.22–5.81)
RDW: 14.7 % (ref 11.5–15.5)
RDW: 14.8 % (ref 11.5–15.5)
WBC: 5.1 10*3/uL (ref 4.0–10.5)
WBC: 5.2 10*3/uL (ref 4.0–10.5)
nRBC: 0 % (ref 0.0–0.2)
nRBC: 0 % (ref 0.0–0.2)

## 2023-06-12 LAB — BASIC METABOLIC PANEL
Anion gap: 10 (ref 5–15)
BUN: 12 mg/dL (ref 8–23)
CO2: 23 mmol/L (ref 22–32)
Calcium: 8.5 mg/dL — ABNORMAL LOW (ref 8.9–10.3)
Chloride: 107 mmol/L (ref 98–111)
Creatinine, Ser: 1.22 mg/dL (ref 0.61–1.24)
GFR, Estimated: >60 mL/min (ref >60)
Glucose, Bld: 97 mg/dL (ref 70–99)
Potassium: 3.1 mmol/L — ABNORMAL LOW (ref 3.5–5.1)
Sodium: 140 mmol/L (ref 135–145)

## 2023-06-12 LAB — RESPIRATORY PANEL BY PCR

## 2023-06-12 LAB — TROPONIN I (HIGH SENSITIVITY)
Troponin I (High Sensitivity): 85 ng/L — ABNORMAL HIGH (ref <18)
Troponin I (High Sensitivity): 88 ng/L — ABNORMAL HIGH (ref <18)

## 2023-06-12 LAB — CREATININE, SERUM
Creatinine, Ser: 1.12 mg/dL (ref 0.61–1.24)
GFR, Estimated: >60 mL/min (ref >60)

## 2023-06-12 LAB — PROCALCITONIN: Procalcitonin: <0.1 ng/mL

## 2023-06-12 LAB — MAGNESIUM: Magnesium: 1.6 mg/dL — ABNORMAL LOW (ref 1.7–2.4)

## 2023-06-12 MED ORDER — HYDRALAZINE HCL 50 MG PO TABS
50.0000 mg | ORAL_TABLET | Freq: Three times a day (TID) | ORAL | Status: DC
  Administered 2023-06-12 – 2023-06-13 (×3): 50 mg via ORAL
  Filled 2023-06-12 (×3): qty 1

## 2023-06-12 MED ORDER — SODIUM CHLORIDE 0.9 % IV SOLN
500.0000 mg | Freq: Once | INTRAVENOUS | Status: AC
  Administered 2023-06-12: 500 mg via INTRAVENOUS
  Filled 2023-06-12: qty 5

## 2023-06-12 MED ORDER — METOPROLOL SUCCINATE ER 50 MG PO TB24
75.0000 mg | ORAL_TABLET | Freq: Two times a day (BID) | ORAL | Status: DC
  Administered 2023-06-12 – 2023-06-13 (×3): 75 mg via ORAL
  Filled 2023-06-12 (×3): qty 1

## 2023-06-12 MED ORDER — ASPIRIN 81 MG PO TBEC
81.0000 mg | DELAYED_RELEASE_TABLET | Freq: Every day | ORAL | Status: DC
  Administered 2023-06-12 – 2023-06-13 (×2): 81 mg via ORAL
  Filled 2023-06-12 (×2): qty 1

## 2023-06-12 MED ORDER — IOHEXOL 350 MG/ML SOLN
75.0000 mL | Freq: Once | INTRAVENOUS | Status: AC | PRN
  Administered 2023-06-12: 75 mL via INTRAVENOUS

## 2023-06-12 MED ORDER — ARFORMOTEROL TARTRATE 15 MCG/2ML IN NEBU
15.0000 ug | INHALATION_SOLUTION | Freq: Two times a day (BID) | RESPIRATORY_TRACT | Status: DC
  Administered 2023-06-12: 15 ug via RESPIRATORY_TRACT
  Filled 2023-06-12 (×2): qty 2

## 2023-06-12 MED ORDER — ALBUTEROL SULFATE (2.5 MG/3ML) 0.083% IN NEBU
3.0000 mL | INHALATION_SOLUTION | Freq: Four times a day (QID) | RESPIRATORY_TRACT | Status: DC | PRN
  Administered 2023-06-12: 3 mL via RESPIRATORY_TRACT
  Filled 2023-06-12: qty 3

## 2023-06-12 MED ORDER — FLUTICASONE PROPIONATE HFA 110 MCG/ACT IN AERO: 2.0000 | INHALATION_SPRAY | Freq: Two times a day (BID) | RESPIRATORY_TRACT | Status: DC

## 2023-06-12 MED ORDER — ATORVASTATIN CALCIUM 80 MG PO TABS
80.0000 mg | ORAL_TABLET | Freq: Every day | ORAL | Status: DC
  Administered 2023-06-12 – 2023-06-13 (×2): 80 mg via ORAL
  Filled 2023-06-12 (×2): qty 1

## 2023-06-12 MED ORDER — FAMOTIDINE 20 MG PO TABS
40.0000 mg | ORAL_TABLET | Freq: Every evening | ORAL | Status: DC
  Administered 2023-06-12: 40 mg via ORAL
  Filled 2023-06-12: qty 2

## 2023-06-12 MED ORDER — CLOPIDOGREL BISULFATE 75 MG PO TABS
75.0000 mg | ORAL_TABLET | Freq: Every day | ORAL | Status: DC
  Administered 2023-06-12 – 2023-06-13 (×2): 75 mg via ORAL
  Filled 2023-06-12 (×2): qty 1

## 2023-06-12 MED ORDER — NITROGLYCERIN 0.4 MG SL SUBL: 0.4000 mg | SUBLINGUAL_TABLET | SUBLINGUAL | Status: DC | PRN

## 2023-06-12 MED ORDER — MAGNESIUM SULFATE 2 GM/50ML IV SOLN
2.0000 g | Freq: Once | INTRAVENOUS | Status: AC
  Administered 2023-06-12: 2 g via INTRAVENOUS
  Filled 2023-06-12: qty 50

## 2023-06-12 MED ORDER — UMECLIDINIUM BROMIDE 62.5 MCG/ACT IN AEPB
1.0000 | INHALATION_SPRAY | Freq: Every day | RESPIRATORY_TRACT | Status: DC
  Filled 2023-06-12: qty 7

## 2023-06-12 MED ORDER — BUDESONIDE 0.25 MG/2ML IN SUSP
0.2500 mg | Freq: Two times a day (BID) | RESPIRATORY_TRACT | Status: DC
  Administered 2023-06-12: 0.25 mg via RESPIRATORY_TRACT
  Filled 2023-06-12 (×2): qty 2

## 2023-06-12 MED ORDER — POTASSIUM CHLORIDE CRYS ER 20 MEQ PO TBCR
40.0000 meq | EXTENDED_RELEASE_TABLET | Freq: Once | ORAL | Status: AC
  Administered 2023-06-12: 40 meq via ORAL
  Filled 2023-06-12: qty 2

## 2023-06-12 MED ORDER — IPRATROPIUM-ALBUTEROL 0.5-2.5 (3) MG/3ML IN SOLN
3.0000 mL | Freq: Four times a day (QID) | RESPIRATORY_TRACT | Status: DC
  Administered 2023-06-12 – 2023-06-13 (×2): 3 mL via RESPIRATORY_TRACT
  Filled 2023-06-12 (×3): qty 3

## 2023-06-12 MED ORDER — ENOXAPARIN SODIUM 40 MG/0.4ML IJ SOSY
40.0000 mg | PREFILLED_SYRINGE | INTRAMUSCULAR | Status: DC
  Administered 2023-06-12 – 2023-06-13 (×2): 40 mg via SUBCUTANEOUS
  Filled 2023-06-12 (×2): qty 0.4

## 2023-06-12 MED ORDER — IPRATROPIUM-ALBUTEROL 0.5-2.5 (3) MG/3ML IN SOLN: 3.0000 mL | Freq: Four times a day (QID) | RESPIRATORY_TRACT | Status: DC

## 2023-06-12 MED ORDER — TORSEMIDE 20 MG PO TABS
20.0000 mg | ORAL_TABLET | Freq: Every day | ORAL | Status: DC
  Administered 2023-06-12 – 2023-06-13 (×2): 20 mg via ORAL
  Filled 2023-06-12 (×2): qty 1

## 2023-06-12 MED ORDER — SODIUM CHLORIDE 0.9 % IV SOLN
1.0000 g | Freq: Once | INTRAVENOUS | Status: AC
  Administered 2023-06-12: 1 g via INTRAVENOUS
  Filled 2023-06-12: qty 10

## 2023-06-12 NOTE — ED Notes (Signed)
ED TO INPATIENT HANDOFF REPORT  ED Nurse Name and Phone #: Dahlia Client [phone]  S Name/Age/Gender [name] 82 y.o. male Room/Bed: 042C/042C  Code Status   Code Status: Full Code  Home/SNF/Other Home Patient oriented to: self, place, time, and situation Is this baseline? Yes   Triage Complete: Triage complete  Chief Complaint Chest pain [R07.9]  Triage Note Pt arrives c/o left sided, non-radiating CP since 5am today. Pt endorses SOB, pt denies N/V. Pt with cardiac hx   Allergies Allergies  Allergen Reactions   Acyclovir And Related Other (See Comments)    Pt states he is not familiar with this medication.   Aspirin Other (See Comments)    GI upset at high doses only.    Level of Care/Admitting Diagnosis ED Disposition     ED Disposition  Admit   Condition  --   Comment  Hospital Area: [facility] [[id number]]  Level of Care: Telemetry Medical [[id number]]  May place patient in observation at [facility] or [facility] if equivalent level of care is available:: No  Covid Evaluation: Confirmed COVID Negative  Diagnosis: Chest pain [604540]  Admitting Physician: Inez Catalina [[id number]]  Attending Physician: Nena Polio          B Medical/Surgery History Past Medical History:  Diagnosis Date   Acute renal failure (HCC) 05/31/2014   Angina decubitus (HCC) 06/15/2014   CAD (coronary artery disease)    a. s/p stent RCA DES 9/09; b. 2014 Attempted PCI of OM1 @ [place];  c. 04/2014 Cath: LAD 40-50p, D1 95-99 (chronic), LCX 30-40 inf branch, OM1 CTO, RCA 30-40p, RCA patent stent, EF 35%->Med Rx.   Chronic combined systolic and diastolic CHF (congestive heart failure) (HCC)    a. EF about 40 to 45% per echo in April 2013;  b. 04/2014 Echo: EF 20-25%, sev LVH, sev glob HK, Gr 1 DD, mildly reduced RV fxn, PASP . c. 01/2017: EF improved to 50-55%.    Chronic low back pain    Complete heart block (HCC)    COPD (chronic obstructive pulmonary  disease) (HCC) dx 06/2013   PFTs 07/08/13: mod obst with resp to bronchodilator, moderate decrease diffusion, airtrapping   COPD with acute exacerbation (HCC) 01/17/2017   COPD with asthma (HCC)    07/08/13 PFT: FEV1 1.74L (66% pred, 30% change with BD), mod obst with resp to bronchodilator, moderate decrease diffusion, air-trapping 11/2013 Simple spiro>> clear obstruction, FEV1 1.30 L (47% pred) - trial of symbicort 160 2bid 01/26/15      Depression 09/15/2018   Assessment: Increased sadness and depression since losing job  Plan: Patient denies suicidal and homicidal ideations Patient would not like to start medication therapies at this time Will establish patient with [facility] for primary care   Dyslipidemia    a. on statin   Essential hypertension    HLD (hyperlipidemia)    HTN (hypertension)    a. Reports intolerance to hydralazine; b. no beta blockers 2/2 bradycardia;  c. failed on ACE and ARB.   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 04/2014 Echo: EF 20-25%, sev glob HK.   Mixed Ischemic/Non-Ischemic Cardiomyopathy    probable mixed ischemic and non-ischemic    Noncompliance    OSA (obstructive sleep apnea) 04/18/2018   06/11/16 - home sleep study  shows AHI of 2.9 an hour with the lowest SaO2 of 79% with an average of 93%  03/08/2018-Home sleep study-AHI 7/HR, SaO2 low 81%    Paroxysmal atrial fibrillation (HCC)    a. identified on device interrogation 01/2016   Pneumonia 09/02/2013   Pulmonary edema 05/10/2014   Respiratory arrest (HCC) 05/18/2014   Sinus bradycardia 05/31/2014   Ventricular tachycardia (HCC)    treated with ATP,  CL 250-300 msec   Past Surgical History:  Procedure Laterality Date   BI-VENTRICULAR IMPLANTABLE CARDIOVERTER DEFIBRILLATOR N/A 08/12/2014   MDT Ovidio Kin XT CRTD implanted by Dr  Johney Frame   BIV ICD GENERATOR CHANGEOUT N/A 08/08/2021   Procedure: BIV ICD GENERATOR CHANGEOUT;  Surgeon: Hillis Range, MD;  Location: [facility] INVASIVE CV LAB;  Service: Cardiovascular;  Laterality: N/A;   CARDIAC CATHETERIZATION     ejection fraction 50%   CORONARY STENT INTERVENTION N/A 12/10/2022   Procedure: CORONARY STENT INTERVENTION;  Surgeon: Swaziland, Peter M, MD;  Location: [facility] INVASIVE CV LAB;  Service: Cardiovascular;  Laterality: N/A;   CORONARY STENT INTERVENTION N/A 12/12/2022   Procedure: CORONARY STENT INTERVENTION;  Surgeon: Swaziland, Peter M, MD;  Location: [facility] INVASIVE CV LAB;  Service: Cardiovascular;  Laterality: N/A;   CORONARY ULTRASOUND/IVUS N/A 12/10/2022   Procedure: Intravascular Ultrasound/IVUS;  Surgeon: Swaziland, Peter M, MD;  Location: [facility] INVASIVE CV LAB;  Service: Cardiovascular;  Laterality: N/A;   CORONARY ULTRASOUND/IVUS N/A 12/12/2022   Procedure: Coronary Ultrasound/IVUS;  Surgeon: Swaziland, Peter M, MD;  Location: [facility] INVASIVE CV LAB;  Service: Cardiovascular;  Laterality: N/A;   LEFT HEART CATH AND CORONARY ANGIOGRAPHY N/A 12/10/2022   Procedure: LEFT HEART CATH AND CORONARY ANGIOGRAPHY;  Surgeon: Swaziland, Peter M, MD;  Location: [facility] INVASIVE CV LAB;  Service: Cardiovascular;  Laterality: N/A;   LEFT HEART CATHETERIZATION WITH CORONARY ANGIOGRAM N/A 05/08/2014   Procedure: LEFT HEART CATHETERIZATION WITH CORONARY ANGIOGRAM;  Surgeon: Micheline Chapman, MD;  Location: [facility] CATH LAB;  Service: Cardiovascular;  Laterality: N/A;     A IV Location/Drains/Wounds Patient Lines/Drains/Airways Status     Active Line/Drains/Airways     Name Placement date Placement time Site Days   Peripheral IV 06/12/23 20 G Right Antecubital 06/12/23  0859  Antecubital  less than 1            Intake/Output Last 24 hours No intake or output data in the 24 hours ending 06/12/23 1244  Labs/Imaging Results for orders placed or performed during the hospital encounter of 06/12/23 (from the past 48  hour(s))  Basic metabolic panel     Status: Abnormal   Collection Time: 06/12/23  7:00 AM  Result Value Ref Range   Sodium 140 135 - 145 mmol/L   Potassium 3.1 (L) 3.5 - 5.1 mmol/L   Chloride 107 98 - 111 mmol/L   CO2 23 22 - 32 mmol/L   Glucose, Bld 97 70 - 99 mg/dL    Comment: Glucose reference range applies only to samples taken after fasting for at least 8 hours.   BUN 12 8 - 23 mg/dL   Creatinine, Ser 0.10 0.61 - 1.24 mg/dL   Calcium 8.5 (L) 8.9 - 10.3 mg/dL   GFR, Estimated >27 >25 mL/min    Comment: (NOTE) Calculated using the CKD-EPI Creatinine Equation (2021)    Anion gap 10 5 - 15    Comment: Performed at [facility], [address], [place], [place] [address]  CBC     Status: Abnormal   Collection Time:  06/12/23  7:00 AM  Result Value Ref Range   WBC 5.1 4.0 - 10.5 K/uL   RBC 3.60 (L) 4.22 - 5.81 MIL/uL   Hemoglobin 9.7 (L) 13.0 - 17.0 g/dL   HCT 16.1 (L) 09.6 - 04.5 %   MCV 89.4 80.0 - 100.0 fL   MCH 26.9 26.0 - 34.0 pg   MCHC 30.1 30.0 - 36.0 g/dL   RDW 40.9 81.1 - 91.4 %   Platelets 121 (L) 150 - 400 K/uL    Comment: REPEATED TO VERIFY   nRBC 0.0 0.0 - 0.2 %    Comment: Performed at [facility], [address], [place], [place] [address]  Troponin I (High Sensitivity)     Status: Abnormal   Collection Time: 06/12/23  7:00 AM  Result Value Ref Range   Troponin I (High Sensitivity) 85 (H) <18 ng/L    Comment: (NOTE) Elevated high sensitivity troponin I (hsTnI) values and significant  changes across serial measurements may suggest ACS but many other  chronic and acute conditions are known to elevate hsTnI results.  Refer to the "Links" section for chest pain algorithms and additional  guidance. Performed at [facility], [address] [place], [place] [address]   Resp panel by RT-PCR (RSV, Flu A&B, Covid) Anterior Nasal Swab     Status: None   Collection Time: 06/12/23  8:57 AM   Specimen: Anterior Nasal Swab  Result Value Ref Range    SARS Coronavirus 2 by RT PCR NEGATIVE NEGATIVE   Influenza A by PCR NEGATIVE NEGATIVE   Influenza B by PCR NEGATIVE NEGATIVE    Comment: (NOTE) The Xpert Xpress SARS-CoV-2/FLU/RSV plus assay is intended as an aid in the diagnosis of influenza from Nasopharyngeal swab specimens and should not be used as a sole basis for treatment. Nasal washings and aspirates are unacceptable for Xpert Xpress SARS-CoV-2/FLU/RSV testing.  Fact Sheet for Patients: BloggerCourse.com  Fact Sheet for Healthcare Providers: SeriousBroker.it  This test is not yet approved or cleared by the [place] FDA and has been authorized for detection and/or diagnosis of SARS-CoV-2 by FDA under an Emergency Use Authorization (EUA). This EUA will remain in effect (meaning this test can be used) for the duration of the COVID-19 declaration under Section 564(b)(1) of the Act, 21 U.S.C. section 360bbb-3(b)(1), unless the authorization is terminated or revoked.     Resp Syncytial Virus by PCR NEGATIVE NEGATIVE    Comment: (NOTE) Fact Sheet for Patients: BloggerCourse.com  Fact Sheet for Healthcare Providers: SeriousBroker.it  This test is not yet approved or cleared by the [place] FDA and has been authorized for detection and/or diagnosis of SARS-CoV-2 by FDA under an Emergency Use Authorization (EUA). This EUA will remain in effect (meaning this test can be used) for the duration of the COVID-19 declaration under Section 564(b)(1) of the Act, 21 U.S.C. section 360bbb-3(b)(1), unless the authorization is terminated or revoked.  Performed at [facility], [address], [place], [place] [address]   Troponin I (High Sensitivity)     Status: Abnormal   Collection Time: 06/12/23  9:01 AM  Result Value Ref Range   Troponin I (High Sensitivity) 88 (H) <18 ng/L    Comment: (NOTE) Elevated high  sensitivity troponin I (hsTnI) values and significant  changes across serial measurements may suggest ACS but many other  chronic and acute conditions are known to elevate hsTnI results.  Refer to the "Links" section for chest pain algorithms and  additional  guidance. Performed at [facility], [address] [place], [place] [address]    CT Angio Chest PE W and/or Wo Contrast  Result Date: 06/12/2023 CLINICAL DATA:  Pulmonary embolism (PE) suspected, high prob. Left-sided chest pain. Shortness of breath. EXAM: CT ANGIOGRAPHY CHEST WITH CONTRAST TECHNIQUE: Multidetector CT imaging of the chest was performed using the standard protocol during bolus administration of intravenous contrast. Multiplanar CT image reconstructions and MIPs were obtained to evaluate the vascular anatomy. RADIATION DOSE REDUCTION: This exam was performed according to the departmental dose-optimization program which includes automated exposure control, adjustment of the mA and/or kV according to patient size and/or use of iterative reconstruction technique. CONTRAST:  75mL OMNIPAQUE IOHEXOL 350 MG/ML SOLN COMPARISON:  CT chest/abdomen/pelvis dated December 06, 2022. Chest radiograph dated May 28, 2023. FINDINGS: Cardiovascular: Satisfactory opacification of the pulmonary arteries to the segmental level. No evidence of pulmonary embolism. Atherosclerotic calcifications of the thoracic aorta and arch branch vessels. Stable left-sided AICD. Heart size is enlarged. Multivessel coronary artery calcifications. No pericardial effusion. Mediastinum/Nodes: No enlarged mediastinal, hilar, or axillary lymph nodes. Thyroid gland, trachea, and esophagus demonstrate no significant findings. Lungs/Pleura: Rounded loculated fluid in the right minor fissure measures 5.8 x 4.7 cm in axial dimension and 4.2 cm craniocaudal. There are patchy ground-glass and consolidative changes within the right middle lobe distal to the loculated fluid  within the right minor fissure. There is a small right pleural effusion with mild basilar atelectasis. Upper lobe predominant paraseptal emphysema. No pneumothorax. Upper Abdomen: No acute abnormality. Musculoskeletal: No chest wall abnormality. No acute or significant osseous findings. Review of the MIP images confirms the above findings. IMPRESSION: 1. No evidence of pulmonary embolism. 2. Loculated fluid in the right minor fissure with patchy ground-glass and consolidative changes in the right middle lobe, which may represent pneumonia or atelectasis. 3. Small right pleural effusion with associated mild basilar atelectasis. 4.  Aortic Atherosclerosis (ICD10-I70.0). Electronically Signed   By: Hart Robinsons M.D.   On: 06/12/2023 10:24   DG Chest 2 View  Result Date: 06/12/2023 CLINICAL DATA:  Chest pain EXAM: CHEST - 2 VIEW COMPARISON:  Chest radiograph 05/28/2023 FINDINGS: The left chest wall cardiac device and associated leads are stable. The cardiomediastinal silhouette is stable The lungs are hyperinflated with flattening of the diaphragms, unchanged. Rounded opacity projecting over the right midlung measuring approximately 4.5 cm is unchanged consistent with loculated fluid. Patchy opacities in the right middle lobe have worsened in the interim. There is no pulmonary edema. There is no significant left effusion. There is no appreciable pneumothorax. There is no acute osseous abnormality. IMPRESSION: Stable loculated fluid in the right minor fissure with worsened opacities in the right middle lobe which may reflect worsening atelectasis or infection. Electronically Signed   By: Lesia Hausen M.D.   On: 06/12/2023 08:34    Pending Labs Unresulted Labs (From admission, onward)     Start     Ordered   06/19/23 0500  Creatinine, serum  (enoxaparin (LOVENOX)    CrCl >/= 30 ml/min)  Weekly,   R     Comments: while on enoxaparin therapy    06/12/23 1159   06/13/23 0500  CBC  Tomorrow morning,   R         06/12/23 1159   06/13/23 0500  Basic metabolic panel  Tomorrow morning,   R        06/12/23 1159   06/12/23 1159  Procalcitonin  Once,   R  References:    Procalcitonin Lower Respiratory Tract Infection AND Sepsis Procalcitonin Algorithm   06/12/23 1159   06/12/23 1158  Magnesium  Add-on,   AD        06/12/23 1159   06/12/23 1156  CBC  (enoxaparin (LOVENOX)    CrCl >/= 30 ml/min)  Once,   R       Comments: Baseline for enoxaparin therapy IF NOT ALREADY DRAWN.  Notify MD if PLT < 100 K.    06/12/23 1159   06/12/23 1156  Creatinine, serum  (enoxaparin (LOVENOX)    CrCl >/= 30 ml/min)  Once,   R       Comments: Baseline for enoxaparin therapy IF NOT ALREADY DRAWN.    06/12/23 1159            Vitals/Pain Today's Vitals   06/12/23 1119 06/12/23 1145 06/12/23 1205 06/12/23 1243  BP:  (!) 169/92    Pulse:  68 74   Resp:  16 20   Temp: 98 F (36.7 C)   98.1 F (36.7 C)  TempSrc:    Oral  SpO2:  97% 98%   Weight:      Height:      PainSc:        Isolation Precautions No active isolations  Medications Medications  azithromycin (ZITHROMAX) 500 mg in sodium chloride 0.9 % 250 mL IVPB (500 mg Intravenous New Bag/Given 06/12/23 1157)  enoxaparin (LOVENOX) injection 40 mg (has no administration in time range)  aspirin EC tablet 81 mg (has no administration in time range)  potassium chloride SA (KLOR-CON M) CR tablet 40 mEq (40 mEq Oral Given 06/12/23 0907)  iohexol (OMNIPAQUE) 350 MG/ML injection 75 mL (75 mLs Intravenous Contrast Given 06/12/23 0920)  cefTRIAXone (ROCEPHIN) 1 g in sodium chloride 0.9 % 100 mL IVPB (0 g Intravenous Stopped 06/12/23 1157)    Mobility walks     Focused Assessments Cardiac Assessment Handoff:  Cardiac Rhythm: A-V Sequential paced Lab Results  Component Value Date   CKTOTAL 991 (H) 05/24/2008   CKMB 100.4 (H) 05/24/2008   TROPONINI <0.30 06/20/2014   Lab Results  Component Value Date   DDIMER (H) 05/23/2008    0.65        AT THE  INHOUSE ESTABLISHED CUTOFF VALUE OF 0.48 ug/mL FEU, THIS ASSAY HAS BEEN DOCUMENTED IN THE LITERATURE TO HAVE   Does the Patient currently have chest pain? Yes  (improving; almost gone)   R Recommendations: See Admitting Provider Note  Report given to:   Additional Notes:

## 2023-06-12 NOTE — Telephone Encounter (Signed)
Rec'd patients completed application.  Placing application in yellow teams box for BEVESPI inhaler. No longer for Breztri.

## 2023-06-12 NOTE — H&P (Signed)
Date: 06/12/2023               Patient Name:  [name] MRN: [id number]  DOB: May 08, 1941 Age / Sex: 82 y.o., male   PCP: Rocky Morel, DO         Medical Service: Internal Medicine Teaching Service         Attending Physician: Dr. Inez Catalina, MD    First Contact: Dr. Morrie Sheldon Pager: [phone]  Second Contact: Dr. Rana Snare Pager: [phone]       After Hours (After 5p/  First Contact Pager: [phone]  weekends / holidays): Second Contact Pager: [phone]   Chief Complaint: chest pain, dyspnea on exertion  History of Present Illness: [name] is a 82 y.o. M with extensive PMH including ventricular tachycardia, paroxysmal atrial fibrillation, nonischemic cardiomyopathy, LVH, LBBB, HTN, COPD, chronic combined systolic and diastolic HF, CAD who presented to [facility] ED with chest pain and dyspnea on exertion.  Patient explains that around 5A today he woke up with L-sided chest pain that did not radiate but was sharp. He was concerned for either acid reflux/gas pain or even that his defibrillator was discharging. He sent a remote message at that time but says he did not receive response. He drove himself to [facility] ED to be further evaluated. He did have some shortness of breath but this has improved since being here. He does note that he has had shortness of breath since his last hospitalization. He denies having chest pain prior to this morning.  He has otherwise been feeling well with the exception of developing sneezing yesterday evening after work. He has not had fever or chills but does endorse poor appetite and some dizziness. He took 2 allergy pills for his symptoms last night which did make him sleepy and he quickly went to sleep.  This story does differ from his presenting description of symptoms to EDP, who he told he was having exertional shortness of breath and chest pain while at work as well as new cough.  Of note he was recently admitted  09/05-09/12 during  which time he did require chest tube placement for pneumothorax. CXR after removal of the chest tube showed resolution of pneumothorax but did note a residual opacity where the chest tube was placed. He was recommended to follow up with pulmonology as outpatient which is currently scheduled for 10/28.  Meds:  Albuterol 2 puffs q6h PRN shortness of breath, wheezing ASA 81 mg daily Atorvastatin 80 mg daily Clopidogrel 75 mg daily Famotidine 40 mg daily Flovent 2 puffs BID Gabapentin 200 mg daily at bedtime PRN Bevespi Aerosphere 2 puffs BID Hydralazine 50 mg daily DuoNeb q6h PRN wheezing Metoprolol 75 mg BID Torsemide 20 mg daily  Allergies: Allergies as of 06/12/2023 - Review Complete 06/12/2023  Allergen Reaction Noted   Acyclovir and related Other (See Comments) 08/12/2014   Aspirin Other (See Comments) 12/13/2011   Past Medical History: Past Medical History:  Diagnosis Date   Acute renal failure (HCC) 05/31/2014   Angina decubitus (HCC) 06/15/2014   CAD (coronary artery disease)    a. s/p stent RCA DES 9/09; b. 2014 Attempted PCI of OM1 @ [place];  c. 04/2014 Cath: LAD 40-50p, D1 95-99 (chronic), LCX 30-40 inf branch, OM1 CTO, RCA 30-40p, RCA patent stent, EF 35%->Med Rx.   Chronic combined systolic and diastolic CHF (congestive heart failure) (HCC)    a. EF about 40 to 45% per echo in April 2013;  b. 04/2014 Echo: EF 20-25%, sev LVH, sev glob HK, Gr 1 DD, mildly reduced RV fxn, PASP . c. 01/2017: EF improved to 50-55%.    Chronic low back pain    Complete heart block (HCC)    COPD (chronic obstructive pulmonary disease) (HCC) dx 06/2013   PFTs 07/08/13: mod obst with resp to bronchodilator, moderate decrease diffusion, airtrapping   COPD with acute exacerbation (HCC) 01/17/2017   COPD with asthma (HCC)    07/08/13 PFT: FEV1 1.74L (66% pred, 30% change with BD), mod obst with resp to bronchodilator, moderate decrease diffusion, air-trapping 11/2013 Simple spiro>> clear  obstruction, FEV1 1.30 L (47% pred) - trial of symbicort 160 2bid 01/26/15      Depression 09/15/2018   Assessment: Increased sadness and depression since losing job  Plan: Patient denies suicidal and homicidal ideations Patient would not like to start medication therapies at this time Will establish patient with [facility] for primary care   Dyslipidemia    a. on statin   Essential hypertension    HLD (hyperlipidemia)    HTN (hypertension)    a. Reports intolerance to hydralazine; b. no beta blockers 2/2 bradycardia;  c. failed on ACE and ARB.   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 04/2014 Echo: EF 20-25%, sev glob HK.   Mixed Ischemic/Non-Ischemic Cardiomyopathy    probable mixed ischemic and non-ischemic    Noncompliance    OSA (obstructive sleep apnea) 04/18/2018   06/11/16 - home sleep study shows AHI of 2.9 an hour with the lowest SaO2 of 79% with an average of 93%  03/08/2018-Home sleep study-AHI 7/HR, SaO2 low 81%    Paroxysmal atrial fibrillation (HCC)    a. identified on device interrogation 01/2016   Pneumonia 09/02/2013   Pulmonary edema 05/10/2014   Respiratory arrest (HCC) 05/18/2014   Sinus bradycardia 05/31/2014   Ventricular tachycardia (HCC)    treated with ATP,  CL 250-300 msec   Past Surgical History: Past Surgical History:  Procedure Laterality Date   BI-VENTRICULAR IMPLANTABLE CARDIOVERTER DEFIBRILLATOR N/A 08/12/2014   MDT Ovidio Kin XT CRTD implanted by Dr Johney Frame   BIV ICD GENERATOR CHANGEOUT N/A 08/08/2021   Procedure: BIV ICD GENERATOR CHANGEOUT;  Surgeon: Hillis Range, MD;  Location: [facility] INVASIVE CV LAB;  Service: Cardiovascular;  Laterality: N/A;   CARDIAC CATHETERIZATION     ejection fraction 50%   CORONARY STENT INTERVENTION N/A 12/10/2022   Procedure: CORONARY STENT INTERVENTION;   Surgeon: Swaziland, Peter M, MD;  Location: [facility] INVASIVE CV LAB;  Service: Cardiovascular;  Laterality: N/A;   CORONARY STENT INTERVENTION N/A 12/12/2022   Procedure: CORONARY STENT INTERVENTION;  Surgeon: Swaziland, Peter M, MD;  Location: [facility] INVASIVE CV LAB;  Service: Cardiovascular;  Laterality: N/A;   CORONARY ULTRASOUND/IVUS N/A 12/10/2022   Procedure: Intravascular Ultrasound/IVUS;  Surgeon: Swaziland, Peter M, MD;  Location: [facility] INVASIVE CV LAB;  Service: Cardiovascular;  Laterality: N/A;   CORONARY ULTRASOUND/IVUS N/A 12/12/2022   Procedure: Coronary Ultrasound/IVUS;  Surgeon: Swaziland, Peter M, MD;  Location: [facility] INVASIVE CV LAB;  Service: Cardiovascular;  Laterality: N/A;   LEFT HEART CATH AND CORONARY ANGIOGRAPHY N/A 12/10/2022   Procedure: LEFT HEART CATH AND CORONARY ANGIOGRAPHY;  Surgeon: Swaziland, Peter M, MD;  Location: [facility] INVASIVE CV LAB;  Service: Cardiovascular;  Laterality: N/A;   LEFT HEART CATHETERIZATION WITH CORONARY ANGIOGRAM  N/A 05/08/2014   Procedure: LEFT HEART CATHETERIZATION WITH CORONARY ANGIOGRAM;  Surgeon: Micheline Chapman, MD;  Location: [facility] CATH LAB;  Service: Cardiovascular;  Laterality: N/A;   Family History:  Family History  Problem Relation Age of Onset   Heart attack Neg Hx    Stroke Neg Hx    Social History: Lives locally in [place] with his grandson. He works in Office [employer] and denies being limited at work by his symptoms. Denies alcohol, tobacco, or other illicit substance use. Independent in ADLs and IADLs. PCP is Dr. Geraldo Pitter in [facility].  Review of Systems: A complete ROS was negative except as per HPI.   Physical Exam: Blood pressure (!) 169/92, pulse 68, temperature 98 F (36.7 C), resp. rate 16, height 5\' 8"  (1.727 m), weight 63.5 kg, SpO2 97%. Constitutional:Appears stated age. In no acute distress. Cardio:Regular rate and rhythm. No murmurs, rubs, or gallops. Pulm:Normal work of breathing on room air. Decreased bibasilar lung sounds with crackles over mid-lung on R. Chest:No  tenderness to palpation. Abdomen:Soft, nontender, nondistended. WUJ:[id number] for extremity edema. Skin:Warm and dry. Neuro:Alert and oriented x3. No focal deficit noted. Psych:Pleasant mood and affect.  EKG: Atrial-sensed ventricular-paced rhythm, biventricular pacemaker detected.  CXR: Stable loculated fluid in the right minor fissure with worsened opacities in the right middle lobe which may reflect worsening atelectasis or infection.  CTA Chest: 1. No evidence of pulmonary embolism. 2. Loculated fluid in the right minor fissure with patchy ground-glass and consolidative changes in the right middle lobe, which may represent pneumonia or atelectasis. 3. Small right pleural effusion with associated mild basilar atelectasis. 4.  Aortic Atherosclerosis (ICD10-I70.0).  Assessment & Plan by Problem: Principal Problem:   Chest pain  ACS vs NSTEMI Hx CAD HLD Presented due to chest pain at rest. Troponin 85>88, which is more elevated than his baseline elevated troponin level (40s-60s). EKG just paced rhythm, telemetry monitor indicating ST elevations intermittently..Currently asymptomatic. Plan: -Will discuss case with cardiology -Continue home ASA 81 mg daily, atorvastatin 80 mg daily, plavix 75 mg daily -Continue atorvastatin 80 mg daily  C/f CAP  CTA concerning for possible pneumonia. No leukocytosis or fever and fortunately is HDS. On room air with good O2 saturations. Started on CAP coverage in ED with azithromycin, ceftriaxone. CURB-65 score of 1 due to age. Of note, patient had chest tube placed on R during last hospitalization and there was residual opacity noted on CXR after removal, and I wonder if some if these imaging findings are reactive/related to recent chest tube placement and removal. Plan: -Check procalcitonin; if remains stable on RA and elevated procalcitonin level, would transition to PO antibiotics tomorrow -Trend CBC, fever curve -F/u ambulatory O2  saturations -F/u RVP  Atrial fibrillation Ventricular tachycardia Complete heart block s/p pacemaker Not on anticoagulation OP. Regular rate with irregular rhythm on exam. Has a pacemaker. Plan: -Continue home metoprolol 75 mg BID  Normocytic anemia Thrombocytopenia Recurrent problem, fortunately stable and no overt bleeding. Patient denied signs of bleeding as OP including in stool, urine. Plan: -Trend CBC -Consider smear review if values continue to decrease -Will check iron studies, vitamin B12, folic acid  Hypokalemia Hypomagnesemia K 3.1, repleted by EDP. Mg 1.4. Plan: -Trend renal function, replete electrolytes as needed -Replete Mg now  Combined systolic and diastolic HF Cardiomyopathy No clinical signs of volume overload. Plan: -Continue home torsemide 20 mg daily  COPD OSA In no acute respiratory distress. Plan: -Continue albuterol q6h PRN, flovent 2 puffs BID, formulary equivalent of bevespi aerosphere  2 puffs BID  HTN Plan: -Continue home hydralazine 50 mg TID, metoprolol 75 mg BID  Dispo: Admit patient to Observation with expected length of stay less than 2 midnights.  SignedChamp Mungo, DO 06/12/2023, 12:07 PM  After 5pm on weekdays and 1pm on weekends: On Call pager: [phone]

## 2023-06-12 NOTE — Plan of Care (Signed)

## 2023-06-12 NOTE — Assessment & Plan Note (Signed)
Patient presents for hospital follow-up after COPD exacerbation.  On discharge he was sent home with triple therapy but unfortunately had some confusion with his medications.  He has been taking Bevespi and Dulera as needed at home as well as his as needed DuoNebs.  He has not had any significant respiratory issues since leaving the hospital but did have some right-sided chest pain when he saw his cardiologist on 05/28/2023 and chest x-ray at that time did show some mildly increased loculated pleural effusion in the right lung and patchy density in the right lower lung but no recurrence of his pneumothorax. - Continue Bevespi and Flovent twice daily with albuterol and DuoNebs as needed

## 2023-06-12 NOTE — Progress Notes (Signed)
Patient has arrived to the unit via stretcher from ED. A/O x 4. VSS. NSR on tele. Oriented patient to the room and staff. Education provided regarding safety precaution. And patent verbalize understanding.

## 2023-06-12 NOTE — Assessment & Plan Note (Signed)
Patient continues to have moderately bothersome left shoulder pain without decreased range of motion or arm weakness or other radicular symptoms.  Pain is mostly intermittent and has been mildly improved by 100 mg gabapentin at bedtime.  Exam today still consistent with no structural abnormality, normal range of motion, and normal neurologic function.  We will trial increase of gabapentin to 200 mg at night as needed.

## 2023-06-12 NOTE — Assessment & Plan Note (Signed)
S/p DES PCI to the mid LAD in 12/2022.  Continues on DAPT which from recent cardiology note recommended indefinite therapy.  Will continue to work with cardiology for titration of DAPT and possible.  Currently no significant bleeding risk outweigh the benefits of DAPT. - Continue aspirin 81 mg daily and Plavix 75 mg daily - Continue atorvastatin 80 mg daily

## 2023-06-12 NOTE — ED Notes (Signed)
Taken to CT by Ct tech.

## 2023-06-12 NOTE — Assessment & Plan Note (Signed)
Most recent echocardiogram from 03/30/2023 shows LVEF of 40 to 45% with mildly decreased LV function and global hypokinesis along with severe concentric LVH and mildly elevated pulmonary artery systolic pressure.  He continues to follow with cardiology and last saw them on 05/28/2023 where his ICD was adjusted to improve LV pacing.  He was having some increased VT events prior to having a mid LAD stent placed in 12/2022 and during his recent hospitalization was having this occur again.  On discharge metoprolol succinate was increased to 75 mg BID.  No medication changes were made at his cardiology follow-up. - Continue hydralazine 50 mg 3 times daily, metoprolol succinate 75 mg twice daily, torsemide 20 mg daily as needed - Continue to follow-up with cardiology

## 2023-06-12 NOTE — ED Provider Notes (Signed)
I provided a substantive portion of the care of this patient.  I personally made/approved the management plan for this patient and take responsibility for the patient management.  EKG Interpretation Date/Time:  Wednesday June 12 2023 06:55:43 EDT Ventricular Rate:  78 PR Interval:  144 QRS Duration:  154 QT Interval:  474 QTC Calculation: 540 R Axis:   -86  Text Interpretation: Atrial-sensed ventricular-paced rhythm Biventricular pacemaker detected Abnormal ECG When compared with ECG of 07-Jun-2023 09:39, PREVIOUS ECG IS PRESENT Confirmed by Ross Marcus ([id number]) on 06/12/2023 6:59:46 AM   Patient is EKG per my interpretation shows paced rhythm.  Unchanged from prior.  Patient presented with chest pain that woke him up from his sleep.  Unlike his prior cardiac equivalent.  Pain has been constant for several hours.  Has had URI symptoms recently.  Chest x-ray per my interpretation shows possible pneumonia.  Low suspicion for acute infarction.  Suspect symptoms are from pneumonia.  Will have chest CT to rule out PE as well.  Describes the pain as sharp.  Initial troponin elevated however patient has chronically elevated troponins.  Will repeat same.   Lorre Nick, MD 06/12/23 [phone]

## 2023-06-12 NOTE — Consult Note (Addendum)
Cardiology Consultation   Patient ID: [name] MRN: [id number]; DOB: 12/12/1940  Admit date: 06/12/2023 Date of Consult: 06/12/2023  PCP:  Rocky Morel, DO   [facility] Providers Cardiologist:  Peter Swaziland, MD  Electrophysiologist:  Regan Lemming, MD  {  Patient Profile:   [name] is a 82 y.o. male with a hx of  chronic nonischemic/ischemic cardiomyopathy with CRT-D,  left bundle branch block,hypertension, CAD status post multiple stents with most recent being in April 2024, subclinical atrial fibrillation not on anticoagulation for EP, COPD, NSVT, CKD who is being seen 06/12/2023 for the evaluation of chest pain at the request of Dr. Criselda Peaches.  History of Present Illness:   Mr. Toelle extensive CAD and history of cardiomyopathy thought to be likely a mix of ischemic/nonischemic.  EF has been as low as 20 to 25% in 2015 with ICD placement.  In March 2024 he had VT requiring ATP and had seen EP was initially started on amiodarone.  He then was hospitalized and underwent cardiac catheterization demonstrated 75% mid LAD, CTO of OM1 with with left to left collaterals, 80% P RCA, 75% mRCA (ISR), and 100% PLOM stenosis with left to left collaterals.  He underwent successful PCI/DES of mid LAD.  He later underwent staged PCI/DES x 2 overlapping in the mid to distal RCA.  Postintervention he had no reflow with distal embolization complicated by MI with plans for medical management.  EF during admission was 30 to 35% but had improved to 50 to 55% by the end of the admission.  He was discharged later that evening but had come back due to chest pain and elevated trops and again medical therapy was recommended.  April 2024 EF reduced again to 35 to 40% with-akinesis of the inferior and inferolateral wall with severe the reduced RV function.  Since then he has had multiple ER admissions due to near syncope in June 2024 thought to be related to volume depletion.  July 2024  admitted for respiratory failure hypertensive emergency with flash pulmonary edema.  Brilinta was switched to Plavix due to concern that it was contributing to his dyspnea.  Again readmitted 04/29/2021 for acute on chronic CHF and COPD exacerbation and diuresed.  He was seen by advanced heart failure in follow-up 04/29/2023 complaining about taking multiple medications and had been out of multiple ones and had not been taking his aspirin.  Reported compliancy with only his atorvastatin and his Plavix.  He was prescribed Imdur 15 mg at that appointment and reported intermittently using his torsemide/old prescription of Lasix.  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 He also has an admission for COPD exacerbation 05/16/2023 and noted to have increased burden of nonsustained SVT so his metoprolol was increased.  Seen by Dr. Elberta Fortis 05/28/2023 who noted device is functioning properly and was minimally being paced off his LBBB.  He adjusted the vector settings and now pacing off his LV lead.  Otherwise normal device function.  Interrogation showed VT episode of 1 min so Toprol XL was increased to 50mg . He did not have some shortness of breath or chest pain but likely related to his chest tube.  Currently he is being evaluated for an acute episode of sharp chest pain that occurred at approximately 5 AM today.  He notes this is the first occurrence of chest pain since his hospitalization in April.  He states that he got up and  started walking and then suddenly once he sat down he had about 15 minutes of nonradiating chest pain associated with some dizziness.  He took a puff of his inhaler and that had helped the pain.  He was concerned about these events and had triggered an alarm on his ICD however did not receive a response so he drove himself to the ER to be evaluated.  Since then he has had no complaints.  Currently he seems short of breath with short bursts  and on supplemental oxygen however denies being short of breath. + cough. He denies any chest pain now, no peripheral edema, dizziness.  Denies any exertional component to his symptoms.  Reported being compliant with his medications however does not like taking them all at once.    CTA of chest is concerning for possible pneumonia.  Small right pleural effusion.  Potassium 3.1.  Magnesium 1.6.  Troponin 85-88.  Hemoglobin 9.3  Addendum: Patient had been yelling from his room that he was extremely short of breath and appeared to be in a COPD exacerbation with extremely tight lung sounds.  I had given him his home inhaler with some improvement in symptoms however still remains to be considerably short of breath.  Attempted to call and message primary services however unable to reach.  Improved however still short of breath.  Will continue to try to reach them  Past Medical History:  Diagnosis Date   Acute renal failure (HCC) 05/31/2014   Angina decubitus (HCC) 06/15/2014   CAD (coronary artery disease)    a. s/p stent RCA DES 9/09; b. 2014 Attempted PCI of OM1 @ [place];  c. 04/2014 Cath: LAD 40-50p, D1 95-99 (chronic), LCX 30-40 inf branch, OM1 CTO, RCA 30-40p, RCA patent stent, EF 35%->Med Rx.   Chronic combined systolic and diastolic CHF (congestive heart failure) (HCC)    a. EF about 40 to 45% per echo in April 2013;  b. 04/2014 Echo: EF 20-25%, sev LVH, sev glob HK, Gr 1 DD, mildly reduced RV fxn, PASP . c. 01/2017: EF improved to 50-55%.    Chronic low back pain    Complete heart block (HCC)    COPD (chronic obstructive pulmonary disease) (HCC) dx 06/2013   PFTs 07/08/13: mod obst with resp to bronchodilator, moderate decrease diffusion, airtrapping   COPD with acute exacerbation (HCC) 01/17/2017   COPD with asthma (HCC)    07/08/13 PFT: FEV1 1.74L (66% pred, 30% change with BD), mod obst with resp to bronchodilator, moderate decrease diffusion, air-trapping 11/2013 Simple spiro>> clear  obstruction, FEV1 1.30 L (47% pred) - trial of symbicort 160 2bid 01/26/15      Depression 09/15/2018   Assessment: Increased sadness and depression since losing job  Plan: Patient denies suicidal and homicidal ideations Patient would not like to start medication therapies at this time Will establish patient with [facility] for primary care   Dyslipidemia    a. on statin   Essential hypertension    HLD (hyperlipidemia)    HTN (hypertension)    a. Reports intolerance to hydralazine; b. no beta blockers 2/2 bradycardia;  c. failed on ACE and ARB.   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 04/2014 Echo: EF 20-25%, sev glob HK.   Mixed Ischemic/Non-Ischemic Cardiomyopathy    probable mixed ischemic and non-ischemic  Noncompliance    OSA (obstructive sleep apnea) 04/18/2018   06/11/16 - home sleep study shows AHI of 2.9 an hour with the lowest SaO2 of 79% with an average of 93%  03/08/2018-Home sleep study-AHI 7/HR, SaO2 low 81%    Paroxysmal atrial fibrillation (HCC)    a. identified on device interrogation 01/2016   Pneumonia 09/02/2013   Pulmonary edema 05/10/2014   Respiratory arrest (HCC) 05/18/2014   Sinus bradycardia 05/31/2014   Ventricular tachycardia (HCC)    treated with ATP,  CL 250-300 msec    Past Surgical History:  Procedure Laterality Date   BI-VENTRICULAR IMPLANTABLE CARDIOVERTER DEFIBRILLATOR N/A 08/12/2014   MDT Ovidio Kin XT CRTD implanted by Dr Johney Frame   BIV ICD GENERATOR CHANGEOUT N/A 08/08/2021   Procedure: BIV ICD GENERATOR CHANGEOUT;  Surgeon: Hillis Range, MD;  Location: [facility] INVASIVE CV LAB;  Service: Cardiovascular;  Laterality: N/A;   CARDIAC CATHETERIZATION     ejection fraction 50%   CORONARY STENT INTERVENTION N/A 12/10/2022   Procedure: CORONARY STENT INTERVENTION;  Surgeon: Swaziland, Peter M,  MD;  Location: [facility] INVASIVE CV LAB;  Service: Cardiovascular;  Laterality: N/A;   CORONARY STENT INTERVENTION N/A 12/12/2022   Procedure: CORONARY STENT INTERVENTION;  Surgeon: Swaziland, Peter M, MD;  Location: [facility] INVASIVE CV LAB;  Service: Cardiovascular;  Laterality: N/A;   CORONARY ULTRASOUND/IVUS N/A 12/10/2022   Procedure: Intravascular Ultrasound/IVUS;  Surgeon: Swaziland, Peter M, MD;  Location: [facility] INVASIVE CV LAB;  Service: Cardiovascular;  Laterality: N/A;   CORONARY ULTRASOUND/IVUS N/A 12/12/2022   Procedure: Coronary Ultrasound/IVUS;  Surgeon: Swaziland, Peter M, MD;  Location: [facility] INVASIVE CV LAB;  Service: Cardiovascular;  Laterality: N/A;   LEFT HEART CATH AND CORONARY ANGIOGRAPHY N/A 12/10/2022   Procedure: LEFT HEART CATH AND CORONARY ANGIOGRAPHY;  Surgeon: Swaziland, Peter M, MD;  Location: [facility] INVASIVE CV LAB;  Service: Cardiovascular;  Laterality: N/A;   LEFT HEART CATHETERIZATION WITH CORONARY ANGIOGRAM N/A 05/08/2014   Procedure: LEFT HEART CATHETERIZATION WITH CORONARY ANGIOGRAM;  Surgeon: Micheline Chapman, MD;  Location: [facility] CATH LAB;  Service: Cardiovascular;  Laterality: N/A;     Inpatient Medications: Scheduled Meds:  arformoterol  15 mcg Nebulization BID   And   umeclidinium bromide  1 puff Inhalation Daily   aspirin EC  81 mg Oral Daily   atorvastatin  80 mg Oral Daily   budesonide (PULMICORT) nebulizer solution  0.25 mg Nebulization BID   clopidogrel  75 mg Oral Daily   enoxaparin (LOVENOX) injection  40 mg Subcutaneous Q24H   famotidine  40 mg Oral QPM   hydrALAZINE  50 mg Oral TID   metoprolol succinate  75 mg Oral BID   torsemide  20 mg Oral Daily   Continuous Infusions:  PRN Meds: albuterol  Allergies:    Allergies  Allergen Reactions   Acyclovir And Related Other (See Comments)    Pt states he is not familiar with this medication.   Aspirin Other (See Comments)    GI upset at high doses only.    Social History:   Social History   Socioeconomic History   Marital  status: Widowed    Spouse name: Not on file   Number of children: 3   Years of education: Not on file   Highest education level: Not on file  Occupational History   Occupation: security guard.  Tobacco Use   Smoking status: Some Days    Current packs/day: 0.00    Average packs/day: 0.5 packs/day for 56.0 years (28.0 ttl  pk-yrs)    Types: Cigarettes    Start date: 09/10/1956    Last attempt to quit: 09/10/2012    Years since quitting: 10.7   Smokeless tobacco: Never   Tobacco comments:    tried 1 month ago (05/2018) but didn't like it   Vaping Use   Vaping status: Never Used  Substance and Sexual Activity   Alcohol use: No   Drug use: No   Sexual activity: Not Currently  Other Topics Concern   Not on file  Social History Narrative          Social Determinants of Health   Financial Resource Strain: High Risk (04/29/2023)   Overall Financial Resource Strain (CARDIA)    Difficulty of Paying Living Expenses: Hard  Food Insecurity: No Food Insecurity (06/12/2023)   Hunger Vital Sign    Worried About Running Out of Food in the Last Year: Never true    Ran Out of Food in the Last Year: Never true  Transportation Needs: No Transportation Needs (06/12/2023)   PRAPARE - Administrator, Civil Service (Medical): No    Lack of Transportation (Non-Medical): No  Physical Activity: Not on file  Stress: Not on file  Social Connections: Not on file  Intimate Partner Violence: Not At Risk (06/12/2023)   Humiliation, Afraid, Rape, and Kick questionnaire    Fear of Current or Ex-Partner: No    Emotionally Abused: No    Physically Abused: No    Sexually Abused: No    Family History:   Family History  Problem Relation Age of Onset   Heart attack Neg Hx    Stroke Neg Hx      ROS:  Please see the history of present illness.  All other ROS reviewed and negative.     Physical Exam/Data:   Vitals:   06/12/23 1341 06/12/23 1537 06/12/23 1538 06/12/23 1626  BP: 109/86      Pulse:      Resp:      Temp:      TempSrc:      SpO2:  92% 100% 100%  Weight:      Height:       No intake or output data in the 24 hours ending 06/12/23 1738    06/12/2023    6:58 AM 06/07/2023    8:18 AM 05/28/2023    2:19 PM  Last 3 Weights  Weight (lbs) 140 lb 142 lb 14.4 oz 142 lb  Weight (kg) 63.504 kg 64.819 kg 64.411 kg     Body mass index is 21.29 kg/m.  General:  Well nourished, well developed, in no acute distress HEENT: normal Neck: + JVD Vascular: No carotid bruits; Distal pulses 2+ bilaterally Cardiac:  normal S1, S2; RRR; no murmur  Lungs: diminished lung sounds, wheezing,  Abd: soft, nontender, no hepatomegaly  Ext: no edema Musculoskeletal:  No deformities, BUE and BLE strength normal and equal Skin: warm and dry  Neuro:  CNs 2-12 intact, no focal abnormalities noted Psych:  Normal affect   EKG:  The EKG was personally reviewed and demonstrates:  Paced HR  Telemetry:  Telemetry was personally reviewed and demonstrates:  paced 80s  Relevant CV Studies: Echocardiogram 04/03/2023 1. Left ventricular ejection fraction, by estimation, is 40 to 45%. The  left ventricle has mildly decreased function. The left ventricle  demonstrates global hypokinesis. There is severe concentric left  ventricular hypertrophy. Left ventricular diastolic   function could not be evaluated. There is mild hypokinesis of  the left  ventricular, basal-mid inferior wall and inferolateral wall.   2. Right ventricular systolic function is normal. The right ventricular  size is normal. There is mildly elevated pulmonary artery systolic  pressure.   3. Left atrial size was moderately dilated.   4. Right atrial size was moderately dilated.   5. The mitral valve is normal in structure. Moderate mitral valve  regurgitation. No evidence of mitral stenosis.   6. The aortic valve is grossly normal. There is mild calcification of the  aortic valve. Aortic valve regurgitation is trivial. Aortic  valve  sclerosis is present, with no evidence of aortic valve stenosis.   7. The inferior vena cava is dilated in size with >50% respiratory  variability, suggesting right atrial pressure of 8 mmHg.   Comparison(s): No significant change from prior study.   Staged procedure 12/12/2022 Prox RCA lesion is 80% stenosed.   Prox RCA to Mid RCA lesion is 75% stenosed.   Ramus lesion is 40% stenosed.   1st Mrg lesion is 100% stenosed.   RPAV lesion is 100% stenosed.   Non-stenotic Prox LAD to Mid LAD lesion was previously treated.   A drug-eluting stent was successfully placed using a SYNERGY XD 3.50X38.   A drug-eluting stent was successfully placed using a STENT ONYX FRONTIER 3.5X38.   Post intervention, there is a 0% residual stenosis.   Post intervention, there is a 0% residual stenosis.   Successful stenting of the proximal and mid RCA with overlapping 3.5 x 38 mm DES but with resultant no reflow due to poor distal vessel runoff   Plan: Brilinta monotherapy indefinitely due to ASA intolerance. Will monitor in ICU. Check troponins. IV Ntg and analgesics for pain.   left heart catheterization 12/10/2022  Prox LAD to Mid LAD lesion is 70% stenosed.   Ramus lesion is 40% stenosed.   1st Mrg lesion is 100% stenosed.   Prox RCA lesion is 80% stenosed.   Prox RCA to Mid RCA lesion is 75% stenosed.   RPAV lesion is 100% stenosed.   A drug-eluting stent was successfully placed using a SYNERGY XD 3.50X38.   Post intervention, there is a 0% residual stenosis.   There is mild to moderate left ventricular systolic dysfunction.   LV end diastolic pressure is mildly elevated.   The left ventricular ejection fraction is 45-50% by visual estimate.   3 vessel obstructive CAD     - 75% mid LAD segemental stenosis with RFR 0.84     - 100% CTO of the first OM with left to left collaterals.     - 80% proximal RCA, 75% mid RCA in stent, 100% PLOM with left to right collaterals. 2. Mild to moderate LV  dysfunction. EF estimated at 45%. 3. Mildly elevated LVEDP 4. Successful PCI of the mid LAD with IVUS guidance and DES (3.5 x 38 Synergy post dilated to 4.0)     Plan: DAPT indefinitely. Will keep overnight. Repeat Echo. Recheck BMET in am. If renal function is stable plan staged PCI of the proximal and mid RCA tomorrow. Would treat PL and OM occlusions medically.   Laboratory Data:  High Sensitivity Troponin:   Recent Labs  Lab 05/16/23 0350 05/20/23 1817 05/20/23 1949 06/12/23 0700 06/12/23 0901  TROPONINIHS 72* 40* 48* 85* 88*     Chemistry Recent Labs  Lab 06/07/23 0956 06/12/23 0700 06/12/23 1215  NA 148* 140  --   K 3.8 3.1*  --   CL 106 107  --  CO2 26 23  --   GLUCOSE 78 97  --   BUN 18 12  --   CREATININE 1.21 1.22 1.12  CALCIUM 9.1 8.5*  --   MG  --   --  1.6*  GFRNONAA  --  >60 >60  ANIONGAP  --  10  --     No results for input(s): "PROT", "ALBUMIN", "AST", "ALT", "ALKPHOS", "BILITOT" in the last 168 hours. Lipids No results for input(s): "CHOL", "TRIG", "HDL", "LABVLDL", "LDLCALC", "CHOLHDL" in the last 168 hours.  Hematology Recent Labs  Lab 06/07/23 0956 06/12/23 0700 06/12/23 1215  WBC 5.4 5.1 5.2  RBC 3.86* 3.60* 3.45*  HGB 10.6* 9.7* 9.3*  HCT 34.3* 32.2* 30.6*  MCV 89 89.4 88.7  MCH 27.5 26.9 27.0  MCHC 30.9* 30.1 30.4  RDW 13.4 14.7 14.8  PLT 142* 121* 112*   Thyroid No results for input(s): "TSH", "FREET4" in the last 168 hours.  BNPNo results for input(s): "BNP", "PROBNP" in the last 168 hours.  DDimer No results for input(s): "DDIMER" in the last 168 hours.   Radiology/Studies:  CT Angio Chest PE W and/or Wo Contrast  Result Date: 06/12/2023 CLINICAL DATA:  Pulmonary embolism (PE) suspected, high prob. Left-sided chest pain. Shortness of breath. EXAM: CT ANGIOGRAPHY CHEST WITH CONTRAST TECHNIQUE: Multidetector CT imaging of the chest was performed using the standard protocol during bolus administration of intravenous contrast.  Multiplanar CT image reconstructions and MIPs were obtained to evaluate the vascular anatomy. RADIATION DOSE REDUCTION: This exam was performed according to the departmental dose-optimization program which includes automated exposure control, adjustment of the mA and/or kV according to patient size and/or use of iterative reconstruction technique. CONTRAST:  75mL OMNIPAQUE IOHEXOL 350 MG/ML SOLN COMPARISON:  CT chest/abdomen/pelvis dated December 06, 2022. Chest radiograph dated May 28, 2023. FINDINGS: Cardiovascular: Satisfactory opacification of the pulmonary arteries to the segmental level. No evidence of pulmonary embolism. Atherosclerotic calcifications of the thoracic aorta and arch branch vessels. Stable left-sided AICD. Heart size is enlarged. Multivessel coronary artery calcifications. No pericardial effusion. Mediastinum/Nodes: No enlarged mediastinal, hilar, or axillary lymph nodes. Thyroid gland, trachea, and esophagus demonstrate no significant findings. Lungs/Pleura: Rounded loculated fluid in the right minor fissure measures 5.8 x 4.7 cm in axial dimension and 4.2 cm craniocaudal. There are patchy ground-glass and consolidative changes within the right middle lobe distal to the loculated fluid within the right minor fissure. There is a small right pleural effusion with mild basilar atelectasis. Upper lobe predominant paraseptal emphysema. No pneumothorax. Upper Abdomen: No acute abnormality. Musculoskeletal: No chest wall abnormality. No acute or significant osseous findings. Review of the MIP images confirms the above findings. IMPRESSION: 1. No evidence of pulmonary embolism. 2. Loculated fluid in the right minor fissure with patchy ground-glass and consolidative changes in the right middle lobe, which may represent pneumonia or atelectasis. 3. Small right pleural effusion with associated mild basilar atelectasis. 4.  Aortic Atherosclerosis (ICD10-I70.0). Electronically Signed   By: Hart Robinsons M.D.   On: 06/12/2023 10:24   DG Chest 2 View  Result Date: 06/12/2023 CLINICAL DATA:  Chest pain EXAM: CHEST - 2 VIEW COMPARISON:  Chest radiograph 05/28/2023 FINDINGS: The left chest wall cardiac device and associated leads are stable. The cardiomediastinal silhouette is stable The lungs are hyperinflated with flattening of the diaphragms, unchanged. Rounded opacity projecting over the right midlung measuring approximately 4.5 cm is unchanged consistent with loculated fluid. Patchy opacities in the right middle lobe have worsened in the interim.  There is no pulmonary edema. There is no significant left effusion. There is no appreciable pneumothorax. There is no acute osseous abnormality. IMPRESSION: Stable loculated fluid in the right minor fissure with worsened opacities in the right middle lobe which may reflect worsening atelectasis or infection. Electronically Signed   By: Lesia Hausen M.D.   On: 06/12/2023 08:34     Assessment and Plan:   Chest pain  CAD Extensive CAD history with recent stenting in April 2024 with DES to mLAD  and later had staged PCI of 2 overlapping stents to mid to distal RCA.  Reviewed films with Dr. Allyson Sabal who feels generally unsuccessful procedure.  He is presenting now with symptoms of chest pain that seem likely related to pulmonary/COPD exacerbation.  He notes episode today that occurred after walking around that was sharp, non radiating, and associated with some dizziness.  During evaluation had another episode of a COPD exacerbation noting the same chest pain.  EKG without any acute ischemic changes.  Troponins mildly elevated and flat in the 80s.  Discussed with Dr. Allyson Sabal who recommends observation for now and treatment of his underlying pulmonary issues with close follow-up. Continue aspirin and Plavix, Toprol-XL 75 mg, statin  Chronic HFmrEF 40-45%  LBBB Mixed nonischemic/ischemic cardiomyopathy  Somewhat labile EF throughout the years as well as 20 to  25% in 2015 with recovery intermittently.  Seems to be stably mildly reduced with most recent echocardiogram in July showing EF 40 to 45%.  Overall euvolemic on exam with some JVD noted that likely is related to increased intrathoracic pressure due to COPD exacerbation.  Compliancy with medication has been a challenge and he does not enjoy taking multiple medications so therefore have kept a more simple approach. Could potentially start Jardiance or spironolactone if patient amenable becomes amenable and able to afford.  Continue Toprol-XL 75 mg twice daily hydralazine 50 mg 3 times daily, torsemide 20mg  Losartan 50 mg was previously stopped at prior admission.  There is been question about exactly what he has been taking so primary team likely to initiate tomorrow depending on what his blood pressure does today on after mentioned medications.  Would continue either way and reduced dosing if needed.  VT CRT-D Was seen recently by Dr. Elberta Fortis 05/28/2023 had noted multiple episodes of nonsustained VT and had reprogrammed and adjusted vector and is not being ventricular pacing off his LV lead.  Does not appear that he has had any shocks.  Can consider device interrogation.  Subclinical Afib  Not anticoagulated per EP due to minimal episodes.   COPD Recent admission.  Continues to have exacerbation here.  Primary team notified.  Risk Assessment/Risk Scores:   New York Heart Association (NYHA) Functional Class NYHA Class III        For questions or updates, please contact [facility] Please consult [email or web address] for contact info under    Signed, Abagail Kitchens, PA-C  06/12/2023 5:38 PM   Agree with note by Stephens November  We are asked to see this patient for atypical chest pain.  He is followed by Dr. Elberta Fortis for nonsustained VT status post BiV ICD implantation in the past for CRT.  He has had multiple coronary interventions in the past most recently by Dr. Swaziland 12/10/22 with  complex PCI of RCA for "ISR".  He had no reflow and it is unclear whether that vessel remains patent.  He is also had stenting to his LAD which was patent at the time of his  last cath.  He has had an EF as low as 20 to 25% but most recently his EF was in the 40 to 45% range.  He developed chest pain this morning.  He also developed shortness of breath.  He is on oral diuretics.  He has COPD with exacerbations recently admitted as well on inhaled bronchodilators.  He is pain-free now.  EKG shows no acute changes.  His enzymes are flat.  He has minimal air movement on exam with inspiratory and expiratory wheezes.  He does not appear to be volume overloaded.  At this point, I think his problems are mostly pulmonary.  I do not think he needs an invasive evaluation.  He is on the medicine service and we will let them treat his pulmonary issues and will be available for further cardiology workup as needed.  Runell Gess, M.D., FACP, [facility], [facility] [address] [place], [place]  [address]  [phone] 06/12/2023 6:25 PM

## 2023-06-12 NOTE — ED Notes (Signed)
SPO2 remained at 99% while ambulating

## 2023-06-12 NOTE — Assessment & Plan Note (Signed)
Patient with subclinical A-fib not on anticoagulation.  On exam it is difficult to tell if he is having frequent PVCs or is in atrial fibrillation.  EKG is consistent with prior and shows paced rhythm with PVCs.  We will continue to monitor and not start anticoagulation today.

## 2023-06-12 NOTE — Progress Notes (Signed)
Patient had a 7 beats run of V-Tach. VSS. Patient asymptomatic. MD aware. No new order received at this time.

## 2023-06-12 NOTE — Assessment & Plan Note (Signed)
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

## 2023-06-12 NOTE — ED Triage Notes (Signed)
Pt arrives c/o left sided, non-radiating CP since 5am today. Pt endorses SOB, pt denies N/V. Pt with cardiac hx

## 2023-06-12 NOTE — ED Provider Notes (Signed)
[place] EMERGENCY DEPARTMENT AT [facility] Provider Note   CSN: [id number] Arrival date & time: 06/12/23  4132     History  Chief Complaint  Patient presents with   Chest Pain    [name] is a 82 y.o. male with an extensive medical history to include V. tach, paroxysmal A-fib, nonischemic cardiomyopathy, LVH, LBBB, hypertension, COPD, chronic combined systolic and diastolic CHF, CAD.  Patient presents to the ED for evaluation of chest pain and dyspnea on exertion.  Patient states that this morning he was awakened from his sleep with 10 out of 10 left-sided chest pain that did not radiate.  States that he also noticed he was very short of breath at this time.  Reports that chest pain decreased over the course of the morning and is currently 5 out of 10.  He reports that yesterday he was having issues with shortness of breath with exertion.  He reports that this morning he also noticed that his chest pain would increase when he was walking.  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  Denies missing any doses of this.  Denies lightheadedness, dizziness, weakness, leg swelling, syncope.  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  Denies nausea, vomiting, back pain, abdominal pain.  Denies blood in stool or other blood loss.   Chest Pain Associated symptoms: cough and shortness of breath   Associated symptoms: no fever        Home Medications Prior to Admission medications   Medication Sig Start Date End Date Taking? Authorizing Provider  albuterol (VENTOLIN HFA) 108 (90 Base) MCG/ACT inhaler Inhale 2 puffs into the lungs every 6 (six) hours as needed for wheezing or shortness of breath. 05/20/23   Reymundo Poll, MD  aspirin 81 MG chewable tablet Chew 1 tablet (81 mg total) by mouth daily. 06/07/23   Rocky Morel,  DO  atorvastatin (LIPITOR) 80 MG tablet Take 1 tablet (80 mg total) by mouth once daily. 04/29/23   Andrey Farmer, PA-C  clopidogrel (PLAVIX) 75 MG tablet Take 1 tablet (75 mg total) by mouth daily. 05/21/23   Alexander-Savino, [place], MD  famotidine (PEPCID) 40 MG tablet Take 1 tablet (40 mg total) by mouth every evening. 06/07/23   Rocky Morel, DO  feeding supplement (ENSURE ENLIVE / ENSURE PLUS) LIQD Take 237 mLs by mouth 2 (two) times daily between meals. 05/20/23   Alexander-Savino, [place], MD  fluticasone (FLOVENT HFA) 110 MCG/ACT inhaler Inhale 2 puffs into the lungs 2 (two) times daily. 05/23/23 05/22/24  Marrianne Mood, MD  gabapentin (NEURONTIN) 100 MG capsule Take 2 capsules (200 mg total) by mouth at bedtime as needed (for shoulder pain). 06/07/23   Rocky Morel, DO  Glycopyrrolate-Formoterol (BEVESPI AEROSPHERE) 9-4.8 MCG/ACT AERO Inhale 2 puffs into the lungs 2 (two) times daily. Do NOT use more than 2 times per day 06/07/23   Rocky Morel, DO  hydrALAZINE (APRESOLINE) 50 MG tablet Take 1 tablet (50 mg total) by mouth 3 (three) times daily. 06/07/23   Rocky Morel, DO  hydrocortisone cream 1 % Apply topically every 6 (six) hours as needed for itching. 05/20/23   Alexander-Savino, [place], MD  ipratropium-albuterol (DUONEB) 0.5-2.5 (3) MG/3ML SOLN Take 3 mLs by nebulization every 6 (six) hours as needed (wheezing or shortness of breath). 04/19/23  Monna Fam, MD  metoprolol succinate (TOPROL-XL) 25 MG 24 hr tablet Take 3 tablets (75 mg total) by mouth 2 (two) times daily. 05/23/23   Marrianne Mood, MD  polyvinyl alcohol (LIQUIFILM TEARS) 1.4 % ophthalmic solution Place 1 drop into both eyes as needed for dry eyes. 06/07/23   Rocky Morel, DO  torsemide (DEMADEX) 20 MG tablet Take 1 tablet (20 mg total) by mouth daily. 06/07/23   Rocky Morel, DO      Allergies    Acyclovir and related and Aspirin    Review of Systems   Review of Systems  Constitutional:   Positive for chills. Negative for fever.  Respiratory:  Positive for cough and shortness of breath.   Cardiovascular:  Positive for chest pain.    Physical Exam Updated Vital Signs BP (!) 166/95   Pulse (!) 59   Temp 98 F (36.7 C)   Resp 18   Ht 5\' 8"  (1.727 m)   Wt 63.5 kg   SpO2 99%   BMI 21.29 kg/m  Physical Exam Vitals and nursing note reviewed.  Constitutional:      General: He is not in acute distress.    Appearance: Normal appearance. He is not ill-appearing, toxic-appearing or diaphoretic.  HENT:     Head: Normocephalic and atraumatic.     Nose: Nose normal.     Mouth/Throat:     Mouth: Mucous membranes are moist.     Pharynx: Oropharynx is clear.  Eyes:     Extraocular Movements: Extraocular movements intact.     Conjunctiva/sclera: Conjunctivae normal.     Pupils: Pupils are equal, round, and reactive to light.  Cardiovascular:     Rate and Rhythm: Normal rate and regular rhythm.  Pulmonary:     Effort: Pulmonary effort is normal.     Breath sounds: Normal breath sounds. No wheezing.  Abdominal:     General: Abdomen is flat. Bowel sounds are normal.     Palpations: Abdomen is soft.     Tenderness: There is no abdominal tenderness.  Musculoskeletal:     Cervical back: Normal range of motion and neck supple. No tenderness.     Right lower leg: No edema.     Left lower leg: No edema.  Skin:    General: Skin is warm and dry.     Capillary Refill: Capillary refill takes less than 2 seconds.  Neurological:     Mental Status: He is alert and oriented to person, place, and time.     ED Results / Procedures / Treatments   Labs (all labs ordered are listed, but only abnormal results are displayed) Labs Reviewed  BASIC METABOLIC PANEL - Abnormal; Notable for the following components:      Result Value   Potassium 3.1 (*)    Calcium 8.5 (*)    All other components within normal limits  CBC - Abnormal; Notable for the following components:   RBC 3.60 (*)     Hemoglobin 9.7 (*)    HCT 32.2 (*)    Platelets 121 (*)    All other components within normal limits  TROPONIN I (HIGH SENSITIVITY) - Abnormal; Notable for the following components:   Troponin I (High Sensitivity) 85 (*)    All other components within normal limits  TROPONIN I (HIGH SENSITIVITY) - Abnormal; Notable for the following components:   Troponin I (High Sensitivity) 88 (*)    All other components within normal limits  RESP PANEL BY RT-PCR (RSV, FLU A&B,  COVID)  [id number]    EKG EKG Interpretation Date/Time:  Wednesday June 12 2023 06:55:43 EDT Ventricular Rate:  78 PR Interval:  144 QRS Duration:  154 QT Interval:  474 QTC Calculation: 540 R Axis:   -86  Text Interpretation: Atrial-sensed ventricular-paced rhythm Biventricular pacemaker detected Abnormal ECG When compared with ECG of 07-Jun-2023 09:39, PREVIOUS ECG IS PRESENT Confirmed by Ross Marcus ([id number]) on 06/12/2023 6:59:46 AM  Radiology CT Angio Chest PE W and/or Wo Contrast  Result Date: 06/12/2023 CLINICAL DATA:  Pulmonary embolism (PE) suspected, high prob. Left-sided chest pain. Shortness of breath. EXAM: CT ANGIOGRAPHY CHEST WITH CONTRAST TECHNIQUE: Multidetector CT imaging of the chest was performed using the standard protocol during bolus administration of intravenous contrast. Multiplanar CT image reconstructions and MIPs were obtained to evaluate the vascular anatomy. RADIATION DOSE REDUCTION: This exam was performed according to the departmental dose-optimization program which includes automated exposure control, adjustment of the mA and/or kV according to patient size and/or use of iterative reconstruction technique. CONTRAST:  75mL OMNIPAQUE IOHEXOL 350 MG/ML SOLN COMPARISON:  CT chest/abdomen/pelvis dated December 06, 2022. Chest radiograph dated May 28, 2023. FINDINGS: Cardiovascular: Satisfactory opacification of the pulmonary arteries to the segmental level. No evidence of pulmonary embolism.  Atherosclerotic calcifications of the thoracic aorta and arch branch vessels. Stable left-sided AICD. Heart size is enlarged. Multivessel coronary artery calcifications. No pericardial effusion. Mediastinum/Nodes: No enlarged mediastinal, hilar, or axillary lymph nodes. Thyroid gland, trachea, and esophagus demonstrate no significant findings. Lungs/Pleura: Rounded loculated fluid in the right minor fissure measures 5.8 x 4.7 cm in axial dimension and 4.2 cm craniocaudal. There are patchy ground-glass and consolidative changes within the right middle lobe distal to the loculated fluid within the right minor fissure. There is a small right pleural effusion with mild basilar atelectasis. Upper lobe predominant paraseptal emphysema. No pneumothorax. Upper Abdomen: No acute abnormality. Musculoskeletal: No chest wall abnormality. No acute or significant osseous findings. Review of the MIP images confirms the above findings. IMPRESSION: 1. No evidence of pulmonary embolism. 2. Loculated fluid in the right minor fissure with patchy ground-glass and consolidative changes in the right middle lobe, which may represent pneumonia or atelectasis. 3. Small right pleural effusion with associated mild basilar atelectasis. 4.  Aortic Atherosclerosis (ICD10-I70.0). Electronically Signed   By: Hart Robinsons M.D.   On: 06/12/2023 10:24   DG Chest 2 View  Result Date: 06/12/2023 CLINICAL DATA:  Chest pain EXAM: CHEST - 2 VIEW COMPARISON:  Chest radiograph 05/28/2023 FINDINGS: The left chest wall cardiac device and associated leads are stable. The cardiomediastinal silhouette is stable The lungs are hyperinflated with flattening of the diaphragms, unchanged. Rounded opacity projecting over the right midlung measuring approximately 4.5 cm is unchanged consistent with loculated fluid. Patchy opacities in the right middle lobe have worsened in the interim. There is no pulmonary edema. There is no significant left effusion. There is  no appreciable pneumothorax. There is no acute osseous abnormality. IMPRESSION: Stable loculated fluid in the right minor fissure with worsened opacities in the right middle lobe which may reflect worsening atelectasis or infection. Electronically Signed   By: Lesia Hausen M.D.   On: 06/12/2023 08:34    Procedures Procedures   Medications Ordered in ED Medications  cefTRIAXone (ROCEPHIN) 1 g in sodium chloride 0.9 % 100 mL IVPB (1 g Intravenous New Bag/Given 06/12/23 1126)  azithromycin (ZITHROMAX) 500 mg in sodium chloride 0.9 % 250 mL IVPB (has no administration in time range)  potassium chloride SA (KLOR-CON  M) CR tablet 40 mEq (40 mEq Oral Given 06/12/23 0907)  iohexol (OMNIPAQUE) 350 MG/ML injection 75 mL (75 mLs Intravenous Contrast Given 06/12/23 0920)    ED Course/ Medical Decision Making/ A&P  Medical Decision Making Amount and/or Complexity of Data Reviewed Labs: ordered. Radiology: ordered.  Risk Prescription drug management.   82 year old male with extensive medical history presents to ED for evaluation.  Please see HPI for further details.  On examination patient is afebrile, nontachycardic.  His lung sounds are clear bilaterally, he is not hypoxic.  Abdomen is soft and compressible throughout.  Neurological examination is at baseline without focal neurodeficits.  No edema to bilateral lower extremities.  Patient CBC without leukocytosis, hemoglobin 9.7.  5 days ago patient hemoglobin was 10.6.  He denies any blood loss to account for this.  Looks like his baseline is possibly around 11.  Metabolic panel shows potassium 3.1 repleted with 40 mEq oral potassium.  No other electrolyte derangement, anion gap 10.  Patient troponin 85, 88 with delta.  Patient troponins are typically elevated.  Viral panel negative for all.  Chest x-ray shows possible area of consolidation or atelectasis concerning for infection.  Will proceed with CT angiogram to rule out blood clot, get better  assessment of lungs.  His EKG shows a paced rhythm which is unchanged from prior EKGs.  CTA shows no PE however does show a loculated fluid in the right minor fissure with patchy groundglass and consolidative changes in the right middle lobe which may represent pneumonia.  Patient started on CAP treatment at this time.  The patient was ambulated and is not hypoxic however due to his extensive comorbidities to feel as if patient requires admission for observation overnight.  His curb 65 score is a 1 however still feel as if he needs admission for observation.  Discussed with attending who agrees.  Reached out to Dr. August Saucer, internal medicine teaching service, who has agreed to admit the patient for further management and care.  Patient amenable to plan.   Final Clinical Impression(s) / ED Diagnoses Final diagnoses:  Community acquired pneumonia of right middle lobe of lung    Rx / DC Orders ED Discharge Orders     None         Clent Ridges 06/12/23 1151    Lorre Nick, MD 06/13/23 [phone]

## 2023-06-12 NOTE — ED Notes (Signed)
Pt transferred to rm 42. Reports that his chest pain has mostly resolved. Cardiac monitor in place. Call bell within reach. Awaiting admit bed.

## 2023-06-12 NOTE — Assessment & Plan Note (Addendum)
Blood pressure remains elevated today at 161/66.  Losartan was held from recent discharge and he continues on hydralazine 50 mg 3 times daily and metoprolol succinate 75 mg twice daily.  He has not been taking his medications exactly correctly and may only be taking metoprolol 25 mg daily or twice a day.  We will get a BMP today and he will be checking his blood pressures at home.  We will discuss resuming losartan based on stable or improved renal function and home blood pressures.  Of note he has been on amlodipine in the past but this was stopped for an unknown reason so this could be an option if needed in the future. - Continue hydralazine 50 mg 3 times daily and metoprolol succinate 75 mg twice daily - BMP today and discussed resuming losartan based on these results and home blood pressures

## 2023-06-13 ENCOUNTER — Other Ambulatory Visit (HOSPITAL_COMMUNITY): Payer: Self-pay

## 2023-06-13 DIAGNOSIS — I519 Heart disease, unspecified: Secondary | ICD-10-CM

## 2023-06-13 DIAGNOSIS — I472 Ventricular tachycardia, unspecified: Secondary | ICD-10-CM

## 2023-06-13 DIAGNOSIS — R072 Precordial pain: Secondary | ICD-10-CM

## 2023-06-13 DIAGNOSIS — J441 Chronic obstructive pulmonary disease with (acute) exacerbation: Secondary | ICD-10-CM

## 2023-06-13 DIAGNOSIS — E785 Hyperlipidemia, unspecified: Secondary | ICD-10-CM

## 2023-06-13 DIAGNOSIS — I2510 Atherosclerotic heart disease of native coronary artery without angina pectoris: Secondary | ICD-10-CM

## 2023-06-13 LAB — CBC
HCT: 30.3 % — ABNORMAL LOW (ref 39.0–52.0)
Hemoglobin: 9.7 g/dL — ABNORMAL LOW (ref 13.0–17.0)
MCH: 27.5 pg (ref 26.0–34.0)
MCHC: 32 g/dL (ref 30.0–36.0)
MCV: 85.8 fL (ref 80.0–100.0)
Platelets: 140 10*3/uL — ABNORMAL LOW (ref 150–400)
RBC: 3.53 MIL/uL — ABNORMAL LOW (ref 4.22–5.81)
RDW: 14.9 % (ref 11.5–15.5)
WBC: 5.1 10*3/uL (ref 4.0–10.5)
nRBC: 0 % (ref 0.0–0.2)

## 2023-06-13 LAB — BASIC METABOLIC PANEL
Anion gap: 11 (ref 5–15)
BUN: 19 mg/dL (ref 8–23)
CO2: 26 mmol/L (ref 22–32)
Calcium: 8.6 mg/dL — ABNORMAL LOW (ref 8.9–10.3)
Chloride: 102 mmol/L (ref 98–111)
Creatinine, Ser: 1.55 mg/dL — ABNORMAL HIGH (ref 0.61–1.24)
GFR, Estimated: 45 mL/min — ABNORMAL LOW (ref >60)
Glucose, Bld: 82 mg/dL (ref 70–99)
Potassium: 3.5 mmol/L (ref 3.5–5.1)
Sodium: 139 mmol/L (ref 135–145)

## 2023-06-13 LAB — IRON AND TIBC
Iron: 31 ug/dL — ABNORMAL LOW (ref 45–182)
Saturation Ratios: 11 % — ABNORMAL LOW (ref 17.9–39.5)
TIBC: 290 ug/dL (ref 250–450)
UIBC: 259 ug/dL

## 2023-06-13 LAB — FOLATE: Folate: 13 ng/mL (ref >5.9)

## 2023-06-13 LAB — FERRITIN: Ferritin: 33 ng/mL (ref 24–336)

## 2023-06-13 LAB — MAGNESIUM: Magnesium: 1.8 mg/dL (ref 1.7–2.4)

## 2023-06-13 LAB — VITAMIN B12: Vitamin B-12: 301 pg/mL (ref 180–914)

## 2023-06-13 MED ORDER — PREDNISONE 10 MG PO TABS
20.0000 mg | ORAL_TABLET | Freq: Every day | ORAL | 0 refills | Status: AC
Start: 2023-06-13 — End: 2023-06-18
  Filled 2023-06-13: qty 10, 5d supply, fill #0

## 2023-06-13 MED ORDER — MAGNESIUM SULFATE 2 GM/50ML IV SOLN
2.0000 g | Freq: Once | INTRAVENOUS | Status: AC
  Administered 2023-06-13: 2 g via INTRAVENOUS
  Filled 2023-06-13: qty 50

## 2023-06-13 MED ORDER — POTASSIUM CHLORIDE CRYS ER 20 MEQ PO TBCR
40.0000 meq | EXTENDED_RELEASE_TABLET | Freq: Once | ORAL | Status: AC
  Administered 2023-06-13: 40 meq via ORAL
  Filled 2023-06-13: qty 2

## 2023-06-13 NOTE — Progress Notes (Addendum)
Transition of Care [facility]) - Inpatient Brief Assessment   Patient Details  Name: [name] MRN: [id number] Date of Birth: Nov 21, 1940  [facility] CM/SW Contact:    Janae Bridgeman, RN Phone Number: 06/13/2023, 1:38 PM   Clinical Narrative: CM noted patient admitted from home for Chest pain.   No TOC needs at this time.  Resources provided since patient has no documented insurance provider.  Patient has established PCP noted.  Financial resources provided in the AVS.  Smoking cessation included since patient is a current smoker.   Transition of Care Asessment: Insurance and Status: (P) Selfpay (No insurance coverage noted in chart) Patient has primary care physician: (P) Yes Home environment has been reviewed: (P) From home Prior level of function:: (P) Independent Prior/Current Home Services: (P) No current home services Social Determinants of Health Reivew: (P) SDOH reviewed no interventions necessary Readmission risk has been reviewed: (P) Yes Transition of care needs: (P) no transition of care needs at this time

## 2023-06-13 NOTE — Plan of Care (Signed)

## 2023-06-13 NOTE — Progress Notes (Addendum)
Patient had 18 beats of V-Tach. MD notified. EKG ordered

## 2023-06-13 NOTE — Discharge Summary (Addendum)
Name: [name] MRN: [id number] DOB: Aug 18, 1941 82 y.o. PCP: Rocky Morel, DO  Date of Admission: 06/12/2023  6:44 AM Date of Discharge:  06/13/23 Attending Physician: Dr. Criselda Peaches  DISCHARGE DIAGNOSIS:  Primary Problem: Chest pain   Hospital Problems: Principal Problem:   Chest pain    DISCHARGE MEDICATIONS:   Allergies as of 06/13/2023       Reactions   Acyclovir And Related Other (See Comments)   Pt states he is not familiar with this medication.   Aspirin Other (See Comments)   GI upset at high doses only.        Medication List     TAKE these medications    albuterol 108 (90 Base) MCG/ACT inhaler Commonly known as: VENTOLIN HFA Inhale 2 puffs into the lungs every 6 (six) hours as needed for wheezing or shortness of breath. What changed: when to take this   aspirin 81 MG chewable tablet Chew 1 tablet (81 mg total) by mouth daily.   atorvastatin 80 MG tablet Commonly known as: LIPITOR Take 1 tablet (80 mg total) by mouth once daily.   Bevespi Aerosphere 9-4.8 MCG/ACT Aero Generic drug: Glycopyrrolate-Formoterol Inhale 2 puffs into the lungs 2 (two) times daily. Do NOT use more than 2 times per day   clopidogrel 75 MG tablet Commonly known as: PLAVIX Take 1 tablet (75 mg total) by mouth daily.   famotidine 40 MG tablet Commonly known as: PEPCID Take 1 tablet (40 mg total) by mouth every evening.   feeding supplement Liqd Take 237 mLs by mouth 2 (two) times daily between meals.   fluticasone 110 MCG/ACT inhaler Commonly known as: FLOVENT HFA Inhale 2 puffs into the lungs 2 (two) times daily.   gabapentin 100 MG capsule Commonly known as: NEURONTIN Take 2 capsules (200 mg total) by mouth at bedtime as needed (for shoulder pain).   hydrALAZINE 50 MG tablet Commonly known as: APRESOLINE Take 1 tablet (50 mg total) by mouth 3 (three) times daily.   hydrocortisone cream 1 % Apply topically every 6 (six) hours as needed for itching.    ipratropium-albuterol 0.5-2.5 (3) MG/3ML Soln Commonly known as: DUONEB Take 3 mLs by nebulization every 6 (six) hours as needed (wheezing or shortness of breath).   metoprolol succinate 25 MG 24 hr tablet Commonly known as: TOPROL-XL Take 3 tablets (75 mg total) by mouth 2 (two) times daily.   polyvinyl alcohol 1.4 % ophthalmic solution Commonly known as: LIQUIFILM TEARS Place 1 drop into both eyes as needed for dry eyes.   predniSONE 10 MG tablet Commonly known as: DELTASONE Take 2 tablets (20 mg total) by mouth daily for 5 days.   torsemide 20 MG tablet Commonly known as: DEMADEX Take 1 tablet (20 mg total) by mouth daily.        DISPOSITION AND FOLLOW-UP:  [name] was discharged from [facility] in Stable condition. At the hospital follow up visit please address:  Follow-up Recommendations: Consults: Cardiology, Dr. Allyson Sabal  Labs: Basic Metabolic Profile Studies: None,  Medications: Jardiance  Follow-up Appointments:  Follow-up Information     Rocky Morel, DO Follow up on 06/20/2023.   Specialty: Internal Medicine Why: Scheduled for 315 PM Contact information: [address] [place] [address] [phone]                 HOSPITAL COURSE:  Patient Summary: #Chest Pain  #COPD Exacerbation #CAD  #HLD  Presented with chest pain likely 2/2 to  COPD exacerbation. Pain occurred after walking and was described as sharp, non-radiating, and associated w/ dizziness. Noted to have had similar presentation with previous episode of COPD exacerbation. Tropes were elevated but remained flat in 80s. CT chest demonstrated loculated fluid in the right minor fissure with patchy ground-glass and consolidative changes in the right middle lobe. There was also a small right pleural effusion with associated bibasilar atelectasis. Due to concern for loculation, Dr. Katrinka Blazing from pulm crit was consulted curbside. He noted that there were varying  densities on the CT. Patient had a trapped lung with an attempt at expansion. This resulted in a hematoma that developed into a ball of fiber in the fissure, so it was okay to follow-up in 6-8 weeks. Home ASA, atorvastatin, and Plavix continued. Overnight prior to discharge, patient had shortness of breath with diffuse wheezing but was saturating well. He was given DuoNebs that resulted in improvement in breathing. He also had runs of asymptomatic vtach. Patient has remained hemodynamically stable with no leukocytosis or fever. OP follow-up with Dr. Isaiah Serge on 10/28. Stable at discharge. Dr. Allyson Sabal with Cardiology consulted and felt this most likely a primary pulmonary problem.  Continue follow up with pulmonary and cardiology outpatient.    #CKD 3a Creatinine increased from 1.12 yesterday to 1.55 this morning. GFR decreased from >60 yesterday to 45 today. Denied any signs or symptoms. Cr has ranged 1.1 - 2 over the last year.  Baseline likely somewhere in that range.    #Iron-deficiency anemia  #Thrombocytopenia  Baseline hemoglobin of around 11. At discharge, stable ~9.7. Platelets also decreased at 140. Ferritin WNL. Iron decreased at 31 with saturation ratio of 11. Denied any signs or symptoms. Follow-up outpatient.    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 Euvolemic at discharge. Tropol and torsemide continued. Denied any signs or symptoms. Per cardiology, Jardiance viable option. Patient amendable and provided paperwork. Instructed to take this to his follow-up appointment Stable at discharge.   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 Continue beta-blocker.    #Hypokalemia, resolved #Hypomagnesia, resolved  Noted to have had a mag of 1.6 and potassium of 3.1 that were replenished. WNL at discharge.    DISCHARGE INSTRUCTIONS:   Discharge Instructions     Diet - low sodium heart healthy   Complete by: As directed     Discharge instructions   Complete by: As directed    Dear [name],  You were treated for chest pain that was likely from a COPD exacerbation. At discharge, you have not required any supplemental oxygen.  We have also given you a short duration treatment of steroids to help with your breathing. Please take prednisone 20 mg (2 tablets) for five days.   Your heart doctors also discussed starting a medication called Jardiance. We will follow up with this on your hospital follow-up. You are scheduled to meet with Dr. Geraldo Pitter on 06/20/23 at 3:15 PM.  Please continue to take your home medications as directed.  If you have any questions, please call us at [phone]. We are glad that you are feeling better!   Increase activity slowly   Complete by: As directed        SUBJECTIVE:  Patient was evaluated at bedside. Denies any shortness of breath, chest pain, troubles breathing, fever, chills, or any other signs or symptoms. Saturating well on room air.  Discharge Vitals:   BP (!) 156/67 (BP Location: Left Arm)  Pulse 61   Temp 99.3 F (37.4 C) (Oral)   Resp 20   Ht 5\' 8"  (1.727 m)   Wt 63.5 kg   SpO2 100%   BMI 21.29 kg/m   OBJECTIVE:  Physical Exam Constitutional:      Appearance: He is well-developed.  HENT:     Head: Normocephalic and atraumatic.  Cardiovascular:     Rate and Rhythm: Normal rate and regular rhythm.     Heart sounds: Normal heart sounds.  Pulmonary:     Effort: Pulmonary effort is normal.     Breath sounds: Examination of the right-middle field reveals decreased breath sounds. Decreased breath sounds present.  Abdominal:     General: Bowel sounds are normal.     Palpations: Abdomen is soft.  Skin:    General: Skin is warm.  Neurological:     General: No focal deficit present.     Mental Status: He is alert.     Pertinent Labs, Studies, and Procedures:     Latest Ref Rng & Units 06/13/2023    6:11 AM 06/12/2023   12:15 PM 06/12/2023    7:00  AM  CBC  WBC 4.0 - 10.5 K/uL 5.1  5.2  5.1   Hemoglobin 13.0 - 17.0 g/dL 9.7  9.3  9.7   Hematocrit 39.0 - 52.0 % 30.3  30.6  32.2   Platelets 150 - 400 K/uL 140  112  121        Latest Ref Rng & Units 06/13/2023    6:11 AM 06/12/2023   12:15 PM 06/12/2023    7:00 AM  CMP  Glucose 70 - 99 mg/dL 82   97   BUN 8 - 23 mg/dL 19   12   Creatinine 1.61 - 1.24 mg/dL 0.96  0.45  4.09   Sodium 135 - 145 mmol/L 139   140   Potassium 3.5 - 5.1 mmol/L 3.5   3.1   Chloride 98 - 111 mmol/L 102   107   CO2 22 - 32 mmol/L 26   23   Calcium 8.9 - 10.3 mg/dL 8.6   8.5     CT Angio Chest PE W and/or Wo Contrast  Result Date: 06/12/2023 CLINICAL DATA:  Pulmonary embolism (PE) suspected, high prob. Left-sided chest pain. Shortness of breath. EXAM: CT ANGIOGRAPHY CHEST WITH CONTRAST TECHNIQUE: Multidetector CT imaging of the chest was performed using the standard protocol during bolus administration of intravenous contrast. Multiplanar CT image reconstructions and MIPs were obtained to evaluate the vascular anatomy. RADIATION DOSE REDUCTION: This exam was performed according to the departmental dose-optimization program which includes automated exposure control, adjustment of the mA and/or kV according to patient size and/or use of iterative reconstruction technique. CONTRAST:  75mL OMNIPAQUE IOHEXOL 350 MG/ML SOLN COMPARISON:  CT chest/abdomen/pelvis dated December 06, 2022. Chest radiograph dated May 28, 2023. FINDINGS: Cardiovascular: Satisfactory opacification of the pulmonary arteries to the segmental level. No evidence of pulmonary embolism. Atherosclerotic calcifications of the thoracic aorta and arch branch vessels. Stable left-sided AICD. Heart size is enlarged. Multivessel coronary artery calcifications. No pericardial effusion. Mediastinum/Nodes: No enlarged mediastinal, hilar, or axillary lymph nodes. Thyroid gland, trachea, and esophagus demonstrate no significant findings. Lungs/Pleura: Rounded  loculated fluid in the right minor fissure measures 5.8 x 4.7 cm in axial dimension and 4.2 cm craniocaudal. There are patchy ground-glass and consolidative changes within the right middle lobe distal to the loculated fluid within the right minor fissure. There is a small right pleural  effusion with mild basilar atelectasis. Upper lobe predominant paraseptal emphysema. No pneumothorax. Upper Abdomen: No acute abnormality. Musculoskeletal: No chest wall abnormality. No acute or significant osseous findings. Review of the MIP images confirms the above findings. IMPRESSION: 1. No evidence of pulmonary embolism. 2. Loculated fluid in the right minor fissure with patchy ground-glass and consolidative changes in the right middle lobe, which may represent pneumonia or atelectasis. 3. Small right pleural effusion with associated mild basilar atelectasis. 4.  Aortic Atherosclerosis (ICD10-I70.0). Electronically Signed   By: Hart Robinsons M.D.   On: 06/12/2023 10:24   DG Chest 2 View  Result Date: 06/12/2023 CLINICAL DATA:  Chest pain EXAM: CHEST - 2 VIEW COMPARISON:  Chest radiograph 05/28/2023 FINDINGS: The left chest wall cardiac device and associated leads are stable. The cardiomediastinal silhouette is stable The lungs are hyperinflated with flattening of the diaphragms, unchanged. Rounded opacity projecting over the right midlung measuring approximately 4.5 cm is unchanged consistent with loculated fluid. Patchy opacities in the right middle lobe have worsened in the interim. There is no pulmonary edema. There is no significant left effusion. There is no appreciable pneumothorax. There is no acute osseous abnormality. IMPRESSION: Stable loculated fluid in the right minor fissure with worsened opacities in the right middle lobe which may reflect worsening atelectasis or infection. Electronically Signed   By: Lesia Hausen M.D.   On: 06/12/2023 08:34     Signed: Morrie Sheldon, MD Internal Medicine Resident,  PGY-1 [name]  Pager: [phone] 2:19 PM, 06/13/2023

## 2023-06-13 NOTE — Progress Notes (Signed)
    I got paged by the nurse due to concerns for V-Tach with 18 beats.  [name] is a 82 y.o. M with extensive PMH including ventricular tachycardia, paroxysmal atrial fibrillation, nonischemic cardiomyopathy, LVH, LBBB, HTN, COPD, chronic combined systolic and diastolic HF, CAD who presented to [facility] ED with chest pain and dyspnea on exertion.  Patient was seen at bedside. He was laying comfortably in bed awake , watching TV and didn't seem to be in no acute distress. He denies any acute changes,chest pain or shortness of breath.   I ordered an EKG that showed  Vent rate : 72 BPM PR interval : 144 ms QRS duration: 142 ms QT/QTC-Baz: 462/505 ms P-R-T axes : 63 -86 91  I reviewed his lab. Mg of  1.6 and K of 3.1. It seemed like they were repleted by the day team. Will recheck Mg and if < 2, I will replenish.  Signed: Kathleen Lime, MD  PGY-1, Internal Medicine Resident Pager: [phone]  06/13/2023, 1:45 AM

## 2023-06-13 NOTE — Progress Notes (Signed)
Rounding Note    Patient Name: [name] Date of Encounter: 06/13/2023  [facility] Cardiologist: Peter Swaziland, MD   Subjective   Clinically stable this morning.  No longer has chest pain or shortness of breath.  Inpatient Medications    Scheduled Meds:  arformoterol  15 mcg Nebulization BID   And   umeclidinium bromide  1 puff Inhalation Daily   aspirin EC  81 mg Oral Daily   atorvastatin  80 mg Oral Daily   budesonide (PULMICORT) nebulizer solution  0.25 mg Nebulization BID   clopidogrel  75 mg Oral Daily   enoxaparin (LOVENOX) injection  40 mg Subcutaneous Q24H   famotidine  40 mg Oral QPM   hydrALAZINE  50 mg Oral TID   ipratropium-albuterol  3 mL Nebulization Q6H   metoprolol succinate  75 mg Oral BID   potassium chloride  40 mEq Oral Once   torsemide  20 mg Oral Daily   Continuous Infusions:  magnesium sulfate bolus IVPB     PRN Meds:    Vital Signs    Vitals:   06/12/23 2311 06/13/23 0330 06/13/23 0725 06/13/23 0729  BP: (!) 159/67 (!) 141/74  (!) 156/67  Pulse: 71 76  61  Resp:      Temp: 98.3 F (36.8 C) 98.4 F (36.9 C)  99.3 F (37.4 C)  TempSrc: Oral Oral  Oral  SpO2: 100% 100% 93% 100%  Weight:      Height:        Intake/Output Summary (Last 24 hours) at 06/13/2023 0836 Last data filed at 06/12/2023 2327 Gross per 24 hour  Intake 352.55 ml  Output --  Net 352.55 ml      06/12/2023    6:58 AM 06/07/2023    8:18 AM 05/28/2023    2:19 PM  Last 3 Weights  Weight (lbs) 140 lb 142 lb 14.4 oz 142 lb  Weight (kg) 63.504 kg 64.819 kg 64.411 kg      Telemetry    Paced rhythm with PVCs.  There were runs of nonsustained ventricular tachycardia noted.- Personally Reviewed  ECG    Not performed today- Personally Reviewed  Physical Exam  GEN: No acute distress.   Neck: No JVD Cardiac: RRR, no murmurs, rubs, or gallops.  Respiratory: Clear to auscultation bilaterally. GI: Soft, nontender, non-distended  MS: No edema; No  deformity. Neuro:  Nonfocal  Psych: Normal affect   Labs    High Sensitivity Troponin:   Recent Labs  Lab 05/16/23 0350 05/20/23 1817 05/20/23 1949 06/12/23 0700 06/12/23 0901  TROPONINIHS 72* 40* 48* 85* 88*     Chemistry Recent Labs  Lab 06/07/23 0956 06/12/23 0700 06/12/23 1215 06/13/23 0611  NA 148* 140  --  139  K 3.8 3.1*  --  3.5  CL 106 107  --  102  CO2 26 23  --  26  GLUCOSE 78 97  --  82  BUN 18 12  --  19  CREATININE 1.21 1.22 1.12 1.55*  CALCIUM 9.1 8.5*  --  8.6*  MG  --   --  1.6* 1.8  GFRNONAA  --  >60 >60 45*  ANIONGAP  --  10  --  11    Lipids No results for input(s): "CHOL", "TRIG", "HDL", "LABVLDL", "LDLCALC", "CHOLHDL" in the last 168 hours.  Hematology Recent Labs  Lab 06/12/23 0700 06/12/23 1215 06/13/23 0611  WBC 5.1 5.2 5.1  RBC 3.60* 3.45* 3.53*  HGB 9.7* 9.3*  9.7*  HCT 32.2* 30.6* 30.3*  MCV 89.4 88.7 85.8  MCH 26.9 27.0 27.5  MCHC 30.1 30.4 32.0  RDW 14.7 14.8 14.9  PLT 121* 112* 140*   Thyroid No results for input(s): "TSH", "FREET4" in the last 168 hours.  BNPNo results for input(s): "BNP", "PROBNP" in the last 168 hours.  DDimer No results for input(s): "DDIMER" in the last 168 hours.   Radiology    CT Angio Chest PE W and/or Wo Contrast  Result Date: 06/12/2023 CLINICAL DATA:  Pulmonary embolism (PE) suspected, high prob. Left-sided chest pain. Shortness of breath. EXAM: CT ANGIOGRAPHY CHEST WITH CONTRAST TECHNIQUE: Multidetector CT imaging of the chest was performed using the standard protocol during bolus administration of intravenous contrast. Multiplanar CT image reconstructions and MIPs were obtained to evaluate the vascular anatomy. RADIATION DOSE REDUCTION: This exam was performed according to the departmental dose-optimization program which includes automated exposure control, adjustment of the mA and/or kV according to patient size and/or use of iterative reconstruction technique. CONTRAST:  75mL OMNIPAQUE IOHEXOL  350 MG/ML SOLN COMPARISON:  CT chest/abdomen/pelvis dated December 06, 2022. Chest radiograph dated May 28, 2023. FINDINGS: Cardiovascular: Satisfactory opacification of the pulmonary arteries to the segmental level. No evidence of pulmonary embolism. Atherosclerotic calcifications of the thoracic aorta and arch branch vessels. Stable left-sided AICD. Heart size is enlarged. Multivessel coronary artery calcifications. No pericardial effusion. Mediastinum/Nodes: No enlarged mediastinal, hilar, or axillary lymph nodes. Thyroid gland, trachea, and esophagus demonstrate no significant findings. Lungs/Pleura: Rounded loculated fluid in the right minor fissure measures 5.8 x 4.7 cm in axial dimension and 4.2 cm craniocaudal. There are patchy ground-glass and consolidative changes within the right middle lobe distal to the loculated fluid within the right minor fissure. There is a small right pleural effusion with mild basilar atelectasis. Upper lobe predominant paraseptal emphysema. No pneumothorax. Upper Abdomen: No acute abnormality. Musculoskeletal: No chest wall abnormality. No acute or significant osseous findings. Review of the MIP images confirms the above findings. IMPRESSION: 1. No evidence of pulmonary embolism. 2. Loculated fluid in the right minor fissure with patchy ground-glass and consolidative changes in the right middle lobe, which may represent pneumonia or atelectasis. 3. Small right pleural effusion with associated mild basilar atelectasis. 4.  Aortic Atherosclerosis (ICD10-I70.0). Electronically Signed   By: Hart Robinsons M.D.   On: 06/12/2023 10:24   DG Chest 2 View  Result Date: 06/12/2023 CLINICAL DATA:  Chest pain EXAM: CHEST - 2 VIEW COMPARISON:  Chest radiograph 05/28/2023 FINDINGS: The left chest wall cardiac device and associated leads are stable. The cardiomediastinal silhouette is stable The lungs are hyperinflated with flattening of the diaphragms, unchanged. Rounded opacity  projecting over the right midlung measuring approximately 4.5 cm is unchanged consistent with loculated fluid. Patchy opacities in the right middle lobe have worsened in the interim. There is no pulmonary edema. There is no significant left effusion. There is no appreciable pneumothorax. There is no acute osseous abnormality. IMPRESSION: Stable loculated fluid in the right minor fissure with worsened opacities in the right middle lobe which may reflect worsening atelectasis or infection. Electronically Signed   By: Lesia Hausen M.D.   On: 06/12/2023 08:34    Cardiac Studies   None  Patient Profile     BRANNEN KOPPEN is a 82 y.o. male with a hx of  chronic nonischemic/ischemic cardiomyopathy with CRT-D,  left bundle branch block,hypertension, CAD status post multiple stents with most recent being in April 2024, subclinical atrial fibrillation not on  anticoagulation for EP, COPD, NSVT, CKD who is being seen 06/12/2023 for the evaluation of chest pain at the request of Dr. Criselda Peaches.   Assessment & Plan    1: CAD/chest pain-patient has known coronary disease.  He has had mid LAD stenting as well as stents overlapping in his mid to distal RCA.  He had unsuccessful intervention by Dr. Swaziland a year ago of his RCA complicated by "no reflow".  I believe his "chest pain" was related to shortness of breath potentially from COPD.  His troponins were mildly elevated but flat.  I do not think he needs further cardiac evaluation at this point.  Currently on aspirin and clopidogrel.  2: Mixed ischemic and nonischemic cardiomyopathy-most recent EF in July was 40 to 45%.  Appears euvolemic on exam.  On Toprol.  Not on ARB because of renal insufficiency.  He is also on torsemide at home and appears euvolemic on exam.  3: VT/CRT: Followed by Dr. Elberta Fortis.  Will keep serum potassium over 4 and magnesium over 2.0.  Already on beta-blocker.  4: COPD-Per primary team  At this point, he is pain-free.  I do not think any  further cardiac evaluation is warranted.  Okay for discharge from our point of view.  Can follow-up with Dr. Swaziland as an outpatient.  Will arrange TOC 7 in our clinic. [facility] will sign off.   Medication Recommendations: No changes in medications Other recommendations (labs, testing, etc): None Follow up as an outpatient: We will arrange TOC 7 with heart care.  For questions or updates, please contact [facility] Please consult [email or web address] for contact info under        Signed, Nanetta Batty, MD  06/13/2023, 8:36 AM

## 2023-06-13 NOTE — Progress Notes (Signed)
Tele called and said patient had 9 beats of V-Tach. Patient asymptomatic. MD Aware. No new order received at this time. Will continue to monitor.

## 2023-06-14 ENCOUNTER — Other Ambulatory Visit (HOSPITAL_COMMUNITY): Payer: Self-pay

## 2023-06-19 ENCOUNTER — Inpatient Hospital Stay: Payer: Self-pay | Admitting: Nurse Practitioner

## 2023-06-19 NOTE — Progress Notes (Deleted)
Cardiology Clinic Note   Patient Name: [name] Date of Encounter: 06/19/2023  Primary Care Provider:  Rocky Morel, DO Primary Cardiologist:  Vincent Swaziland, MD  Patient Profile    82 year old male with a history of chronic nonischemic cardiomyopathy, he has CRT-D in situ, left bundle branch block, hypertension, CAD status post multiple stents and most recent intervention in April 2024, subclinical atrial fibrillation not on anticoagulation is followed by EP, COPD, chronic kidney disease, and NSVT.  Was seen on consultation by Dr. Allyson Brewer on 06/12/2023 for chest pain.  After thorough evaluation, it was felt that his chest discomfort was related to COPD exacerbation and no further cardiac endpoint was required.  He was to continue aspirin and clopidogrel.  He was not placed on ARB or ACE due to renal insufficiency.  The patient was discharged on 06/13/2023 after being treated for COPD exacerbation.  He was also noted that there were densities on the CT scan with trapped lung with pneumonia pulmonary expansion this resulted in hematoma that developed into a ball of fiber in the fissure.  He was to follow-up with critical care pulmonary on discharge.  Past Medical History    Past Medical History:  Diagnosis Date   Acute renal failure (HCC) 05/31/2014   Angina decubitus (HCC) 06/15/2014   CAD (coronary artery disease)    a. s/p stent RCA DES 9/09; b. 2014 Attempted PCI of OM1 @ [place];  c. 04/2014 Cath: LAD 40-50p, D1 95-99 (chronic), LCX 30-40 inf branch, OM1 CTO, RCA 30-40p, RCA patent stent, EF 35%->Med Rx.   Chronic combined systolic and diastolic CHF (congestive heart failure) (HCC)    a. EF about 40 to 45% per echo in April 2013;  b. 04/2014 Echo: EF 20-25%, sev LVH, sev glob HK, Gr 1 DD, mildly reduced RV fxn, PASP . c. 01/2017: EF improved to 50-55%.    Chronic low back pain    Complete heart block (HCC)    COPD (chronic obstructive pulmonary disease) (HCC) dx 06/2013   PFTs  07/08/13: mod obst with resp to bronchodilator, moderate decrease diffusion, airtrapping   COPD with acute exacerbation (HCC) 01/17/2017   COPD with asthma (HCC)    07/08/13 PFT: FEV1 1.74L (66% pred, 30% change with BD), mod obst with resp to bronchodilator, moderate decrease diffusion, air-trapping 11/2013 Simple spiro>> clear obstruction, FEV1 1.30 L (47% pred) - trial of symbicort 160 2bid 01/26/15      Depression 09/15/2018   Assessment: Increased sadness and depression since losing job  Plan: Patient denies suicidal and homicidal ideations Patient would not like to start medication therapies at this time Will establish patient with community health and wellness for primary care   Dyslipidemia    a. on statin   Essential hypertension    HLD (hyperlipidemia)    HTN (hypertension)    a. Reports intolerance to hydralazine; b. no beta blockers 2/2 bradycardia;  c. failed on ACE and ARB.   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 04/2014 Echo: EF 20-25%, sev glob HK.   Mixed Ischemic/Non-Ischemic Cardiomyopathy    probable mixed ischemic and non-ischemic    Noncompliance    OSA (obstructive sleep apnea) 04/18/2018   06/11/16 - home sleep study shows AHI of 2.9 an hour with the lowest SaO2 of 79% with an average of 93%  03/08/2018-Home sleep  study-AHI 7/HR, SaO2 low 81%    Paroxysmal atrial fibrillation (HCC)    a. identified on device interrogation 01/2016   Pneumonia 09/02/2013   Pulmonary edema 05/10/2014   Respiratory arrest (HCC) 05/18/2014   Sinus bradycardia 05/31/2014   Ventricular tachycardia (HCC)    treated with ATP,  CL 250-300 msec   Past Surgical History:  Procedure Laterality Date   BI-VENTRICULAR IMPLANTABLE CARDIOVERTER DEFIBRILLATOR N/A 08/12/2014   MDT Ovidio Kin XT CRTD implanted by Dr [name]   BIV ICD GENERATOR  CHANGEOUT N/A 08/08/2021   Procedure: BIV ICD GENERATOR CHANGEOUT;  Surgeon: Vincent Range, MD;  Location: [facility] INVASIVE CV LAB;  Service: Cardiovascular;  Laterality: N/A;   CARDIAC CATHETERIZATION     ejection fraction 50%   CORONARY STENT INTERVENTION N/A 12/10/2022   Procedure: CORONARY STENT INTERVENTION;  Surgeon: Brewer, Vincent M, MD;  Location: [facility] INVASIVE CV LAB;  Service: Cardiovascular;  Laterality: N/A;   CORONARY STENT INTERVENTION N/A 12/12/2022   Procedure: CORONARY STENT INTERVENTION;  Surgeon: Brewer, Vincent M, MD;  Location: [facility] INVASIVE CV LAB;  Service: Cardiovascular;  Laterality: N/A;   CORONARY ULTRASOUND/IVUS N/A 12/10/2022   Procedure: Intravascular Ultrasound/IVUS;  Surgeon: Brewer, Vincent M, MD;  Location: [facility] INVASIVE CV LAB;  Service: Cardiovascular;  Laterality: N/A;   CORONARY ULTRASOUND/IVUS N/A 12/12/2022   Procedure: Coronary Ultrasound/IVUS;  Surgeon: Brewer, Vincent M, MD;  Location: [facility] INVASIVE CV LAB;  Service: Cardiovascular;  Laterality: N/A;   LEFT HEART CATH AND CORONARY ANGIOGRAPHY N/A 12/10/2022   Procedure: LEFT HEART CATH AND CORONARY ANGIOGRAPHY;  Surgeon: Brewer, Vincent M, MD;  Location: [facility] INVASIVE CV LAB;  Service: Cardiovascular;  Laterality: N/A;   LEFT HEART CATHETERIZATION WITH CORONARY ANGIOGRAM N/A 05/08/2014   Procedure: LEFT HEART CATHETERIZATION WITH CORONARY ANGIOGRAM;  Surgeon: Vincent Chapman, MD;  Location: [facility] CATH LAB;  Service: Cardiovascular;  Laterality: N/A;    Allergies  Allergies  Allergen Reactions   Acyclovir And Related Other (See Comments)    Pt states he is not familiar with this medication.   Aspirin Other (See Comments)    GI upset at high doses only.    History of Present Illness    [name] returns to the office today status post hospitalization and ED visit after COPD exacerbation, chest discomfort which was not found to be cardiac in etiology, with known history of nonischemic cardiomyopathy with CRT-D in psych to due to  history of VT.  As noted during ED visit he was also hypomagnesemia this was repleted.  Home Medications    Current Outpatient Medications  Medication Sig Dispense Refill   albuterol (VENTOLIN HFA) 108 (90 Base) MCG/ACT inhaler Inhale 2 puffs into the lungs every 6 (six) hours as needed for wheezing or shortness of breath. (Patient taking differently: Inhale 2 puffs into the lungs as needed for wheezing or shortness of breath.) 6.7 g 3   aspirin 81 MG chewable tablet Chew 1 tablet (81 mg total) by mouth daily. 90 tablet 0   atorvastatin (LIPITOR) 80 MG tablet Take 1 tablet (80 mg total) by mouth once daily. 90 tablet 3   clopidogrel (PLAVIX) 75 MG tablet Take 1 tablet (75 mg total) by mouth daily. 30 tablet 0   famotidine (PEPCID) 40 MG tablet Take 1 tablet (40 mg total) by mouth every evening. 30 tablet 3   feeding supplement (ENSURE ENLIVE / ENSURE PLUS) LIQD Take 237 mLs by mouth 2 (two) times daily between meals. 237 mL 12   fluticasone (FLOVENT  HFA) 110 MCG/ACT inhaler Inhale 2 puffs into the lungs 2 (two) times daily. 12 g 2   gabapentin (NEURONTIN) 100 MG capsule Take 2 capsules (200 mg total) by mouth at bedtime as needed (for shoulder pain). (Patient not taking: Reported on 06/12/2023) 60 capsule 1   Glycopyrrolate-Formoterol (BEVESPI AEROSPHERE) 9-4.8 MCG/ACT AERO Inhale 2 puffs into the lungs 2 (two) times daily. Do NOT use more than 2 times per day 10.7 g 3   hydrALAZINE (APRESOLINE) 50 MG tablet Take 1 tablet (50 mg total) by mouth 3 (three) times daily. 90 tablet 3   hydrocortisone cream 1 % Apply topically every 6 (six) hours as needed for itching. 28 g 0   ipratropium-albuterol (DUONEB) 0.5-2.5 (3) MG/3ML SOLN Take 3 mLs by nebulization every 6 (six) hours as needed (wheezing or shortness of breath). 360 mL 1   metoprolol succinate (TOPROL-XL) 25 MG 24 hr tablet Take 3 tablets (75 mg total) by mouth 2 (two) times daily. 180 tablet 2   polyvinyl alcohol (LIQUIFILM TEARS) 1.4 %  ophthalmic solution Place 1 drop into both eyes as needed for dry eyes. 15 mL 0   torsemide (DEMADEX) 20 MG tablet Take 1 tablet (20 mg total) by mouth daily. 30 tablet 11   No current facility-administered medications for this visit.     Family History    Family History  Problem Relation Age of Onset   Heart attack Neg Hx    Stroke Neg Hx    He indicated that his mother is deceased. He indicated that his father is deceased. He indicated that the status of his neg hx is unknown.  Social History    Social History   Socioeconomic History   Marital status: Widowed    Spouse name: Not on file   Number of children: 3   Years of education: Not on file   Highest education level: Not on file  Occupational History   Occupation: security guard.  Tobacco Use   Smoking status: Some Days    Current packs/day: 0.00    Average packs/day: 0.5 packs/day for 56.0 years (28.0 ttl pk-yrs)    Types: Cigarettes    Start date: 09/10/1956    Last attempt to quit: 09/10/2012    Years since quitting: 10.7   Smokeless tobacco: Never   Tobacco comments:    tried 1 month ago (05/2018) but didn't like it   Vaping Use   Vaping status: Never Used  Substance and Sexual Activity   Alcohol use: No   Drug use: No   Sexual activity: Not Currently  Other Topics Concern   Not on file  Social History Narrative          Social Determinants of Health   Financial Resource Strain: High Risk (04/29/2023)   Overall Financial Resource Strain (CARDIA)    Difficulty of Paying Living Expenses: Hard  Food Insecurity: No Food Insecurity (06/12/2023)   Hunger Vital Sign    Worried About Running Out of Food in the Last Year: Never true    Ran Out of Food in the Last Year: Never true  Transportation Needs: No Transportation Needs (06/12/2023)   PRAPARE - Administrator, Civil Service (Medical): No    Lack of Transportation (Non-Medical): No  Physical Activity: Not on file  Stress: Not on file  Social  Connections: Not on file  Intimate Partner Violence: Not At Risk (06/12/2023)   Humiliation, Afraid, Rape, and Kick questionnaire    Fear of Current  or Ex-Partner: No    Emotionally Abused: No    Physically Abused: No    Sexually Abused: No     Review of Systems    General:  No chills, fever, night sweats or weight changes.  Cardiovascular:  No chest pain, dyspnea on exertion, edema, orthopnea, palpitations, paroxysmal nocturnal dyspnea. Dermatological: No rash, lesions/masses Respiratory: No cough, dyspnea Urologic: No hematuria, dysuria Abdominal:   No nausea, vomiting, diarrhea, bright red blood per rectum, melena, or hematemesis Neurologic:  No visual changes, wkns, changes in mental status. All other systems reviewed and are otherwise negative except as noted above.       Physical Exam    VS:  There were no vitals taken for this visit. , BMI There is no height or weight on file to calculate BMI.     GEN: Well nourished, well developed, in no acute distress. HEENT: normal. Neck: Supple, no JVD, carotid bruits, or masses. Cardiac: RRR, no murmurs, rubs, or gallops. No clubbing, cyanosis, edema.  Radials/DP/PT 2+ and equal bilaterally.  Respiratory:  Respirations regular and unlabored, clear to auscultation bilaterally. GI: Soft, nontender, nondistended, BS + x 4. MS: no deformity or atrophy. Skin: warm and dry, no rash. Neuro:  Strength and sensation are intact. Psych: Normal affect.      Lab Results  Component Value Date   WBC 5.1 06/13/2023   HGB 9.7 (L) 06/13/2023   HCT 30.3 (L) 06/13/2023   MCV 85.8 06/13/2023   PLT 140 (L) 06/13/2023   Lab Results  Component Value Date   CREATININE 1.55 (H) 06/13/2023   BUN 19 06/13/2023   NA 139 06/13/2023   K 3.5 06/13/2023   CL 102 06/13/2023   CO2 26 06/13/2023   Lab Results  Component Value Date   ALT 18 04/29/2023   AST 24 04/29/2023   ALKPHOS 89 04/29/2023   BILITOT 0.6 04/29/2023   Lab Results  Component  Value Date   CHOL 166 12/12/2022   HDL 48 12/12/2022   LDLCALC 94 12/12/2022   LDLDIRECT 132.9 06/18/2013   TRIG 120 12/12/2022   CHOLHDL 3.5 12/12/2022    Lab Results  Component Value Date   HGBA1C 6.1 (H) 12/12/2022     Review of Prior Studies    Echocardiogram 04/03/2023 1. Left ventricular ejection fraction, by estimation, is 40 to 45%. The  left ventricle has mildly decreased function. The left ventricle  demonstrates global hypokinesis. There is severe concentric left  ventricular hypertrophy. Left ventricular diastolic   function could not be evaluated. There is mild hypokinesis of the left  ventricular, basal-mid inferior wall and inferolateral wall.   2. Right ventricular systolic function is normal. The right ventricular  size is normal. There is mildly elevated pulmonary artery systolic  pressure.   3. Left atrial size was moderately dilated.   4. Right atrial size was moderately dilated.   5. The mitral valve is normal in structure. Moderate mitral valve  regurgitation. No evidence of mitral stenosis.   6. The aortic valve is grossly normal. There is mild calcification of the  aortic valve. Aortic valve regurgitation is trivial. Aortic valve  sclerosis is present, with no evidence of aortic valve stenosis.   7. The inferior vena cava is dilated in size with >50% respiratory  variability, suggesting right atrial pressure of 8 mmHg.   Comparison(s): No significant change from prior study.    Staged procedure 12/12/2022 Prox RCA lesion is 80% stenosed.   Prox RCA to Mid  RCA lesion is 75% stenosed.   Ramus lesion is 40% stenosed.   1st Mrg lesion is 100% stenosed.   RPAV lesion is 100% stenosed.   Non-stenotic Prox LAD to Mid LAD lesion was previously treated.   A drug-eluting stent was successfully placed using a SYNERGY XD 3.50X38.   A drug-eluting stent was successfully placed using a STENT ONYX FRONTIER 3.5X38.   Post intervention, there is a 0% residual  stenosis.   Post intervention, there is a 0% residual stenosis.   Successful stenting of the proximal and mid RCA with overlapping 3.5 x 38 mm DES but with resultant no reflow due to poor distal vessel runoff   Plan: Brilinta monotherapy indefinitely due to ASA intolerance. Will monitor in ICU. Check troponins. IV Ntg and analgesics for pain.    left heart catheterization 12/10/2022  Prox LAD to Mid LAD lesion is 70% stenosed.   Ramus lesion is 40% stenosed.   1st Mrg lesion is 100% stenosed.   Prox RCA lesion is 80% stenosed.   Prox RCA to Mid RCA lesion is 75% stenosed.   RPAV lesion is 100% stenosed.   A drug-eluting stent was successfully placed using a SYNERGY XD 3.50X38.   Post intervention, there is a 0% residual stenosis.   There is mild to moderate left ventricular systolic dysfunction.   LV end diastolic pressure is mildly elevated.   The left ventricular ejection fraction is 45-50% by visual estimate.   3 vessel obstructive CAD     - 75% mid LAD segemental stenosis with RFR 0.84     - 100% CTO of the first OM with left to left collaterals.     - 80% proximal RCA, 75% mid RCA in stent, 100% PLOM with left to right collaterals. 2. Mild to moderate LV dysfunction. EF estimated at 45%. 3. Mildly elevated LVEDP 4. Successful PCI of the mid LAD with IVUS guidance and DES (3.5 x 38 Synergy post dilated to 4.0)     Plan: DAPT indefinitely. Will keep overnight. Repeat Echo. Recheck BMET in am. If renal function is stable plan staged PCI of the proximal and mid RCA tomorrow. Would treat PL and OM occlusions medically.   Assessment & Plan   1.  ***     {Are you ordering a CV Procedure (e.g. stress test, cath, DCCV, TEE, etc)?   Press F2        :[id number]}   Signed, Bettey Mare. Liborio Nixon, ANP, AACC   06/19/2023 3:02 PM      Office [phone] Fax [phone]  Notice: This dictation was prepared with Dragon dictation along with smaller phrase technology. Any  transcriptional errors that result from this process are unintentional and may not be corrected upon review.

## 2023-06-20 ENCOUNTER — Encounter: Payer: Self-pay | Admitting: Student

## 2023-06-21 ENCOUNTER — Ambulatory Visit: Payer: Self-pay | Attending: Adult Health | Admitting: Adult Health

## 2023-06-25 NOTE — Progress Notes (Signed)
Internal Medicine Clinic Attending  Case discussed with the resident at the time of the visit.  We reviewed the resident's history and exam and pertinent patient test results.  I agree with the assessment, diagnosis, and plan of care documented in the resident's note.  

## 2023-07-08 ENCOUNTER — Other Ambulatory Visit (HOSPITAL_COMMUNITY): Payer: Self-pay

## 2023-07-08 ENCOUNTER — Encounter: Payer: Self-pay | Admitting: Pulmonary Disease

## 2023-07-08 ENCOUNTER — Other Ambulatory Visit: Payer: Self-pay

## 2023-07-08 ENCOUNTER — Ambulatory Visit (INDEPENDENT_AMBULATORY_CARE_PROVIDER_SITE_OTHER): Payer: Self-pay | Admitting: Pulmonary Disease

## 2023-07-08 ENCOUNTER — Telehealth (HOSPITAL_COMMUNITY): Payer: Self-pay | Admitting: Licensed Clinical Social Worker

## 2023-07-08 ENCOUNTER — Ambulatory Visit: Payer: Self-pay

## 2023-07-08 VITALS — BP 132/78 | HR 53 | Ht 68.0 in | Wt 145.0 lb

## 2023-07-08 DIAGNOSIS — J449 Chronic obstructive pulmonary disease, unspecified: Secondary | ICD-10-CM

## 2023-07-08 LAB — CBC WITH DIFFERENTIAL/PLATELET
Basophils Absolute: 0 10*3/uL (ref 0.0–0.1)
Basophils Relative: 0.5 % (ref 0.0–3.0)
Eosinophils Absolute: 0 10*3/uL (ref 0.0–0.7)
Eosinophils Relative: 0.8 % (ref 0.0–5.0)
HCT: 32.3 % — ABNORMAL LOW (ref 39.0–52.0)
Hemoglobin: 10.1 g/dL — ABNORMAL LOW (ref 13.0–17.0)
Lymphocytes Relative: 32.6 % (ref 12.0–46.0)
Lymphs Abs: 1.6 10*3/uL (ref 0.7–4.0)
MCHC: 31.4 g/dL (ref 30.0–36.0)
MCV: 87.3 fL (ref 78.0–100.0)
Monocytes Absolute: 0.4 10*3/uL (ref 0.1–1.0)
Monocytes Relative: 7.4 % (ref 3.0–12.0)
Neutro Abs: 2.9 10*3/uL (ref 1.4–7.7)
Neutrophils Relative %: 58.7 % (ref 43.0–77.0)
Platelets: 119 10*3/uL — ABNORMAL LOW (ref 150.0–400.0)
RBC: 3.7 Mil/uL — ABNORMAL LOW (ref 4.22–5.81)
RDW: 15.8 % — ABNORMAL HIGH (ref 11.5–15.5)
WBC: 5 10*3/uL (ref 4.0–10.5)

## 2023-07-08 MED ORDER — TRELEGY ELLIPTA 200-62.5-25 MCG/ACT IN AEPB
1.0000 | INHALATION_SPRAY | Freq: Every day | RESPIRATORY_TRACT | 5 refills | Status: DC
Start: 2023-07-08 — End: 2023-07-26
  Filled 2023-07-08: qty 60, 30d supply, fill #0

## 2023-07-08 NOTE — Patient Instructions (Signed)
VISIT SUMMARY:  [name], during your visit today, we discussed your recent job loss and its impact on your health, particularly your COPD and sleep apnea. We reviewed your recent hospitalizations and current medications, and we made some adjustments to better manage your conditions.  YOUR PLAN:  -CHRONIC OBSTRUCTIVE PULMONARY DISEASE (COPD): COPD is a chronic lung disease that makes it hard to breathe. We are changing your Bevespi inhaler to a Trelegy inhaler to help better control your symptoms and prevent future hospitalizations. We will also order a chest X-ray to check if your right lung has fully recovered and to see if there is any fluid buildup. Continue using your Albuterol inhaler as needed.  -SLEEP APNEA: Sleep apnea is a condition where your breathing stops and starts during sleep. You have stopped using your CPAP machine due to discomfort. We did not make any changes today, but we may need to re-evaluate your CPAP settings or consider other treatments in the future.  -FLUID RETENTION: Fluid retention means your body is holding onto extra fluid, which can cause swelling and other symptoms. You are currently managing this with a diuretic, which helps your body get rid of the extra fluid. Continue with your current diuretic regimen, and we will consider further evaluation if your symptoms do not improve.  -GENERAL HEALTH MAINTENANCE: We will provide a letter for your employer stating that you are fit to return to work without restrictions. Continue with physical activity as much as you can tolerate.  INSTRUCTIONS:  Please follow up with a chest X-ray to assess your lung condition. Continue using your Trelegy and Albuterol inhalers as prescribed. If your fluid retention symptoms persist or worsen, please schedule another appointment for further evaluation.

## 2023-07-08 NOTE — Telephone Encounter (Signed)
H&V Care Navigation CSW Progress Note  Clinical Social Worker called to follow up on financial concerns and immigration status progress.  Pt reports he is now working with a Clinical research associate on immigration status- needs proof of his status to apply for social security and other community resources.    Is struggling financially right now as he normally works as a Electrical engineer and has had to switch to light duty and reduce hours due to recent medical concerns.  Is behind on mortgage (owes $66) and is worried about paying for utilities.  CSW provided with local assistance options.  Also provided information for [employer] again as he had never spoken with them- wondering if they could help more quickly than his current lawyer in obtaining immigration status.  Will continue to follow through clinic and assist as needed   SDOH Screenings   Food Insecurity: No Food Insecurity (06/12/2023)  Housing: Low Risk  (06/12/2023)  Transportation Needs: No Transportation Needs (06/12/2023)  Utilities: Not At Risk (06/12/2023)  Alcohol Screen: Low Risk  (04/19/2023)  Depression (PHQ2-9): Low Risk  (06/07/2023)  Financial Resource Strain: High Risk (07/08/2023)  Tobacco Use: High Risk (06/12/2023)   Burna Sis, LCSW Clinical Social Worker [facility] Desk#: [phone] Cell#: [phone]

## 2023-07-08 NOTE — Progress Notes (Unsigned)
[name]    [id number]    [birth year]  Primary Care Physician:Goodwin, Jomarie Longs, DO  Referring Physician: Claiborne Rigg, NP [address] [place],  [place] [address]  Chief complaint: Consult for COPD, posthospitalization follow-up.  HPI: 82 y.o. 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  Discussed the use of AI scribe software for clinical note transcription with the patient, who gave verbal consent to proceed.  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 He reports that his employer drastically reduced his hours and eventually let him go after he was placed on light duty due to his health condition. He is considering legal action due to the sudden loss of income and the impact on his ability to pay his bills.  Regarding his health, [name] was hospitalized twice in the past year for COPD exacerbations. The most recent hospitalization was  earlier in October 2024, during which he also experienced a lung collapse (pneumothorax) on the right side. He underwent a chest tube placement procedure to re-expand his lung, which he reports has significantly improved his breathing. However, he was hospitalized again in early October for another COPD exacerbation. He believes he may have developed fluid in his lungs again and has been taking a diuretic, which he splits in half, to manage this.  [name] also has a history of sleep apnea, for which he was prescribed a CPAP machine. However, he stopped using it about six months ago due to feelings of suffocation. He has been using a nebulizer instead. He quit smoking about three months ago and last smoked a cigarette about three weeks ago. He denies any family history of lung disease.   Pets: Occupation: Exposures: Smoking history: Travel history: Relevant family history:   Outpatient Encounter Medications as of 07/08/2023  Medication Sig   albuterol (VENTOLIN HFA) 108 (90 Base) MCG/ACT inhaler Inhale 2 puffs into the lungs every 6 (six) hours as needed for wheezing or shortness of breath. (Patient taking differently: Inhale 2 puffs into the lungs as needed for wheezing or shortness of breath.)   aspirin 81 MG chewable tablet Chew 1 tablet (81 mg total) by mouth daily.   atorvastatin (LIPITOR) 80 MG tablet Take 1 tablet (80 mg total) by mouth once daily.   clopidogrel (PLAVIX) 75 MG tablet Take 1 tablet (75 mg total) by mouth daily.   famotidine (PEPCID) 40 MG tablet Take 1 tablet (40 mg total) by mouth every evening.   feeding supplement (ENSURE ENLIVE / ENSURE PLUS) LIQD Take 237 mLs by mouth 2 (two) times daily between meals.   Fluticasone-Umeclidin-Vilant (TRELEGY ELLIPTA) 200-62.5-25 MCG/ACT AEPB Inhale 1 Inhalation into the lungs daily.   gabapentin (NEURONTIN) 100 MG capsule Take 2 capsules (200 mg total)  by mouth at bedtime as needed (for shoulder pain).   hydrALAZINE (APRESOLINE) 50  MG tablet Take 1 tablet (50 mg total) by mouth 3 (three) times daily.   hydrocortisone cream 1 % Apply topically every 6 (six) hours as needed for itching.   ipratropium-albuterol (DUONEB) 0.5-2.5 (3) MG/3ML SOLN Take 3 mLs by nebulization every 6 (six) hours as needed (wheezing or shortness of breath).   metoprolol succinate (TOPROL-XL) 25 MG 24 hr tablet Take 3 tablets (75 mg total) by mouth 2 (two) times daily.   polyvinyl alcohol (LIQUIFILM TEARS) 1.4 % ophthalmic solution Place 1 drop into both eyes as needed for dry eyes.   torsemide (DEMADEX) 20 MG tablet Take 1 tablet (20 mg total) by mouth daily.   [DISCONTINUED] fluticasone (FLOVENT HFA) 110 MCG/ACT inhaler Inhale 2 puffs into the lungs 2 (two) times daily.   [DISCONTINUED] Glycopyrrolate-Formoterol (BEVESPI AEROSPHERE) 9-4.8 MCG/ACT AERO Inhale 2 puffs into the lungs 2 (two) times daily. Do NOT use more than 2 times per day   No facility-administered encounter medications on file as of 07/08/2023.    Allergies as of 07/08/2023 - Review Complete 07/08/2023  Allergen Reaction Noted   Acyclovir and related Other (See Comments) 08/12/2014   Aspirin Other (See Comments) 12/13/2011    Past Medical History:  Diagnosis Date   Acute renal failure (HCC) 05/31/2014   Angina decubitus (HCC) 06/15/2014   CAD (coronary artery disease)    a. s/p stent RCA DES 9/09; b. 2014 Attempted PCI of OM1 @ [facility];  c. 04/2014 Cath: LAD 40-50p, D1 95-99 (chronic), LCX 30-40 inf branch, OM1 CTO, RCA 30-40p, RCA patent stent, EF 35%->Med Rx.   Chronic combined systolic and diastolic CHF (congestive heart failure) (HCC)    a. EF about 40 to 45% per echo in April 2013;  b. 04/2014 Echo: EF 20-25%, sev LVH, sev glob HK, Gr 1 DD, mildly reduced RV fxn, PASP . c. 01/2017: EF improved to 50-55%.    Chronic low back pain    Complete heart block (HCC)    COPD (chronic obstructive pulmonary disease) (HCC) dx 06/2013   PFTs 07/08/13: mod obst with resp to  bronchodilator, moderate decrease diffusion, airtrapping   COPD with acute exacerbation (HCC) 01/17/2017   COPD with asthma (HCC)    07/08/13 PFT: FEV1 1.74L (66% pred, 30% change with BD), mod obst with resp to bronchodilator, moderate decrease diffusion, air-trapping 11/2013 Simple spiro>> clear obstruction, FEV1 1.30 L (47% pred) - trial of symbicort 160 2bid 01/26/15      Depression 09/15/2018   Assessment: Increased sadness and depression since losing job  Plan: Patient denies suicidal and homicidal ideations Patient would not like to start medication therapies at this time Will establish patient with [facility] for primary care   Dyslipidemia    a. on statin   Essential hypertension    HLD (hyperlipidemia)    HTN (hypertension)    a. Reports intolerance to hydralazine; b. no beta blockers 2/2 bradycardia;  c. failed on ACE and ARB.   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 04/2014 Echo: EF 20-25%, sev glob HK.   Mixed Ischemic/Non-Ischemic Cardiomyopathy    probable mixed ischemic and non-ischemic    Noncompliance    OSA (obstructive sleep apnea) 04/18/2018   06/11/16 - home sleep study shows AHI of  2.9 an hour with the lowest SaO2 of 79% with an average of 93%  03/08/2018-Home sleep study-AHI 7/HR, SaO2 low 81%    Paroxysmal atrial fibrillation (HCC)    a. identified on device interrogation 01/2016   Pneumonia 09/02/2013   Pulmonary edema 05/10/2014   Respiratory arrest (HCC) 05/18/2014   Sinus bradycardia 05/31/2014   Ventricular tachycardia (HCC)    treated with ATP,  CL 250-300 msec    Past Surgical History:  Procedure Laterality Date   BI-VENTRICULAR IMPLANTABLE CARDIOVERTER DEFIBRILLATOR N/A 08/12/2014   MDT Ovidio Kin XT CRTD implanted by Dr Johney Frame   BIV ICD GENERATOR CHANGEOUT N/A 08/08/2021   Procedure:  BIV ICD GENERATOR CHANGEOUT;  Surgeon: Hillis Range, MD;  [facility] INVASIVE CV LAB;  Service: Cardiovascular;  Laterality: N/A;   CARDIAC CATHETERIZATION     ejection fraction 50%   CORONARY STENT INTERVENTION N/A 12/10/2022   Procedure: CORONARY STENT INTERVENTION;  Surgeon: Swaziland, Peter M, MD;  Location: [facility] INVASIVE CV LAB;  Service: Cardiovascular;  Laterality: N/A;   CORONARY STENT INTERVENTION N/A 12/12/2022   Procedure: CORONARY STENT INTERVENTION;  Surgeon: Swaziland, Peter M, MD;  Location: [facility] INVASIVE CV LAB;  Service: Cardiovascular;  Laterality: N/A;   CORONARY ULTRASOUND/IVUS N/A 12/10/2022   Procedure: Intravascular Ultrasound/IVUS;  Surgeon: Swaziland, Peter M, MD;  Location: [facility] INVASIVE CV LAB;  Service: Cardiovascular;  Laterality: N/A;   CORONARY ULTRASOUND/IVUS N/A 12/12/2022   Procedure: Coronary Ultrasound/IVUS;  Surgeon: Swaziland, Peter M, MD;  Location: [facility] INVASIVE CV LAB;  Service: Cardiovascular;  Laterality: N/A;   LEFT HEART CATH AND CORONARY ANGIOGRAPHY N/A 12/10/2022   Procedure: LEFT HEART CATH AND CORONARY ANGIOGRAPHY;  Surgeon: Swaziland, Peter M, MD;  Location: [facility] INVASIVE CV LAB;  Service: Cardiovascular;  Laterality: N/A;   LEFT HEART CATHETERIZATION WITH CORONARY ANGIOGRAM N/A 05/08/2014   Procedure: LEFT HEART CATHETERIZATION WITH CORONARY ANGIOGRAM;  Surgeon: Micheline Chapman, MD;  Location: [facility] CATH LAB;  Service: Cardiovascular;  Laterality: N/A;    Family History  Problem Relation Age of Onset   Heart attack Neg Hx    Stroke Neg Hx     Social History   Socioeconomic History   Marital status: Widowed    Spouse name: Not on file   Number of children: 3   Years of education: Not on file   Highest education level: Not on file  Occupational History   Occupation: security guard.  Tobacco Use   Smoking status: Some Days    Current packs/day: 0.00    Average packs/day: 0.5 packs/day for 56.0 years (28.0 ttl pk-yrs)    Types: Cigarettes    Start date: 09/10/1956     Last attempt to quit: 09/10/2012    Years since quitting: 10.8   Smokeless tobacco: Never   Tobacco comments:    tried 1 month ago (05/2018) but didn't like it   Vaping Use   Vaping status: Never Used  Substance and Sexual Activity   Alcohol use: No   Drug use: No   Sexual activity: Not Currently  Other Topics Concern   Not on file  Social History Narrative          Social Determinants of Health   Financial Resource Strain: High Risk (07/08/2023)   Overall Financial Resource Strain (CARDIA)    Difficulty of Paying Living Expenses: Hard  Food Insecurity: No Food Insecurity (06/12/2023)   Hunger Vital Sign    Worried About Running Out of Food in the Last Year: Never true  Ran Out of Food in the Last Year: Never true  Transportation Needs: No Transportation Needs (06/12/2023)   PRAPARE - Administrator, Civil Service (Medical): No    Lack of Transportation (Non-Medical): No  Physical Activity: Not on file  Stress: Not on file  Social Connections: Not on file  Intimate Partner Violence: Not At Risk (06/12/2023)   Humiliation, Afraid, Rape, and Kick questionnaire    Fear of Current or Ex-Partner: No    Emotionally Abused: No    Physically Abused: No    Sexually Abused: No    Review of systems: Review of Systems  Constitutional: Negative for fever and chills.  HENT: Negative.   Eyes: Negative for blurred vision.  Respiratory: as per HPI  Cardiovascular: Negative for chest pain and palpitations.  Gastrointestinal: Negative for vomiting, diarrhea, blood per rectum. Genitourinary: Negative for dysuria, urgency, frequency and hematuria.  Musculoskeletal: Negative for myalgias, back pain and joint pain.  Skin: Negative for itching and rash.  Neurological: Negative for dizziness, tremors, focal weakness, seizures and loss of consciousness.  Endo/Heme/Allergies: Negative for environmental allergies.  Psychiatric/Behavioral: Negative for depression, suicidal ideas and  hallucinations.  All other systems reviewed and are negative.  Physical Exam: Blood pressure 132/78, pulse (!) 53, height 5\' 8"  (1.727 m), weight 145 lb (65.8 kg), SpO2 99%. Gen:      No acute distress HEENT:  EOMI, sclera anicteric Neck:     No masses; no thyromegaly Lungs:    Clear to auscultation bilaterally; normal respiratory effort*** CV:         Regular rate and rhythm; no murmurs Abd:      + bowel sounds; soft, non-tender; no palpable masses, no distension Ext:    No edema; adequate peripheral perfusion Skin:      Warm and dry; no rash Neuro: alert and oriented x 3 Psych: normal mood and affect  Data Reviewed: Imaging: Chest x-ray 05/23/2023- Trace right apical pneumothorax.  Oval density in the right major fissure likely represents a loculated effusion.  CTA 06/12/2023-no evidence of pulmonary embolism, loculated fluid in the right minor fissure with patchy groundglass and consolidative changes.  Small right effusion with basilar atelectasis I reviewed the images personally.  PFTs:  Labs:  Assessment and Plan Chronic Obstructive Pulmonary Disease (COPD) Recent hospitalizations for COPD exacerbations and right pneumothorax. Reports improved breathing post chest tube placement. Currently on Bevespi and Albuterol inhalers. -Change Bevespi to Trelegy inhaler for better control and prevention of hospitalizations. -Order chest X-ray to assess for resolution of right pneumothorax and to evaluate for possible loculated effusion. -Continue Albuterol inhaler as needed.  Sleep Apnea Non-compliant with CPAP therapy due to reported suffocation sensation. -No changes to plan at this time, but consider re-evaluation of CPAP settings or alternative therapies in future.  Fluid Retention Reports intermittent decreased urine output, improved with diuretic use. -Continue current diuretic regimen. -Consider further evaluation if symptoms persist or worsen.  General Health  Maintenance -Provide letter for employer stating patient is fit to return to work without restrictions. -Encourage continued physical activity as tolerated.      Recommendations:   Chilton Greathouse MD [facility] 07/09/2023, 8:53 AM  CC: Claiborne Rigg, NP

## 2023-07-16 LAB — IGE: IgE (Immunoglobulin E), Serum: 134 kU/L — ABNORMAL HIGH (ref <=114)

## 2023-07-16 LAB — ALPHA-1 ANTITRYPSIN PHENOTYPE: A-1 Antitrypsin, Ser: 183 mg/dL (ref 83–199)

## 2023-07-22 ENCOUNTER — Other Ambulatory Visit: Payer: Self-pay

## 2023-07-22 ENCOUNTER — Inpatient Hospital Stay (HOSPITAL_COMMUNITY)
Admission: EM | Admit: 2023-07-22 | Discharge: 2023-07-26 | DRG: 190 | Disposition: A | Discharge status: Home / Self Care | Payer: Medicare Other | Attending: Internal Medicine | Admitting: Internal Medicine

## 2023-07-22 ENCOUNTER — Encounter (HOSPITAL_COMMUNITY): Payer: Self-pay

## 2023-07-22 ENCOUNTER — Emergency Department (HOSPITAL_COMMUNITY): Payer: Medicare Other

## 2023-07-22 DIAGNOSIS — Z7902 Long term (current) use of antithrombotics/antiplatelets: Secondary | ICD-10-CM

## 2023-07-22 DIAGNOSIS — J9602 Acute respiratory failure with hypercapnia: Secondary | ICD-10-CM

## 2023-07-22 DIAGNOSIS — K219 Gastro-esophageal reflux disease without esophagitis: Secondary | ICD-10-CM

## 2023-07-22 DIAGNOSIS — E8729 Other acidosis: Secondary | ICD-10-CM | POA: Diagnosis present

## 2023-07-22 DIAGNOSIS — F32A Depression, unspecified: Secondary | ICD-10-CM | POA: Diagnosis present

## 2023-07-22 DIAGNOSIS — E785 Hyperlipidemia, unspecified: Secondary | ICD-10-CM | POA: Diagnosis present

## 2023-07-22 DIAGNOSIS — I472 Ventricular tachycardia, unspecified: Secondary | ICD-10-CM | POA: Diagnosis present

## 2023-07-22 DIAGNOSIS — Z882 Allergy status to sulfonamides status: Secondary | ICD-10-CM

## 2023-07-22 DIAGNOSIS — I25118 Atherosclerotic heart disease of native coronary artery with other forms of angina pectoris: Secondary | ICD-10-CM

## 2023-07-22 DIAGNOSIS — Z888 Allergy status to other drugs, medicaments and biological substances status: Secondary | ICD-10-CM

## 2023-07-22 DIAGNOSIS — Z95810 Presence of automatic (implantable) cardiac defibrillator: Secondary | ICD-10-CM

## 2023-07-22 DIAGNOSIS — I447 Left bundle-branch block, unspecified: Secondary | ICD-10-CM | POA: Diagnosis present

## 2023-07-22 DIAGNOSIS — J441 Chronic obstructive pulmonary disease with (acute) exacerbation: Principal | ICD-10-CM | POA: Diagnosis present

## 2023-07-22 DIAGNOSIS — I10 Essential (primary) hypertension: Secondary | ICD-10-CM | POA: Diagnosis present

## 2023-07-22 DIAGNOSIS — I130 Hypertensive heart and chronic kidney disease with heart failure and stage 1 through stage 4 chronic kidney disease, or unspecified chronic kidney disease: Secondary | ICD-10-CM | POA: Diagnosis present

## 2023-07-22 DIAGNOSIS — N1831 Chronic kidney disease, stage 3a: Secondary | ICD-10-CM | POA: Diagnosis present

## 2023-07-22 DIAGNOSIS — Z79899 Other long term (current) drug therapy: Secondary | ICD-10-CM

## 2023-07-22 DIAGNOSIS — I428 Other cardiomyopathies: Secondary | ICD-10-CM | POA: Diagnosis present

## 2023-07-22 DIAGNOSIS — J9811 Atelectasis: Secondary | ICD-10-CM | POA: Diagnosis present

## 2023-07-22 DIAGNOSIS — J4489 Other specified chronic obstructive pulmonary disease: Secondary | ICD-10-CM

## 2023-07-22 DIAGNOSIS — I480 Paroxysmal atrial fibrillation: Secondary | ICD-10-CM | POA: Diagnosis present

## 2023-07-22 DIAGNOSIS — J9601 Acute respiratory failure with hypoxia: Secondary | ICD-10-CM | POA: Diagnosis present

## 2023-07-22 DIAGNOSIS — Z7951 Long term (current) use of inhaled steroids: Secondary | ICD-10-CM

## 2023-07-22 DIAGNOSIS — M25512 Pain in left shoulder: Secondary | ICD-10-CM

## 2023-07-22 DIAGNOSIS — G8929 Other chronic pain: Secondary | ICD-10-CM | POA: Diagnosis present

## 2023-07-22 DIAGNOSIS — N179 Acute kidney failure, unspecified: Secondary | ICD-10-CM

## 2023-07-22 DIAGNOSIS — I255 Ischemic cardiomyopathy: Secondary | ICD-10-CM | POA: Diagnosis present

## 2023-07-22 DIAGNOSIS — G4733 Obstructive sleep apnea (adult) (pediatric): Secondary | ICD-10-CM | POA: Diagnosis present

## 2023-07-22 DIAGNOSIS — I2510 Atherosclerotic heart disease of native coronary artery without angina pectoris: Secondary | ICD-10-CM | POA: Diagnosis present

## 2023-07-22 DIAGNOSIS — Z1152 Encounter for screening for COVID-19: Secondary | ICD-10-CM

## 2023-07-22 DIAGNOSIS — F17210 Nicotine dependence, cigarettes, uncomplicated: Secondary | ICD-10-CM | POA: Diagnosis present

## 2023-07-22 DIAGNOSIS — Z955 Presence of coronary angioplasty implant and graft: Secondary | ICD-10-CM

## 2023-07-22 DIAGNOSIS — I5042 Chronic combined systolic (congestive) and diastolic (congestive) heart failure: Secondary | ICD-10-CM | POA: Diagnosis present

## 2023-07-22 DIAGNOSIS — Z91199 Patient's noncompliance with other medical treatment and regimen due to unspecified reason: Secondary | ICD-10-CM

## 2023-07-22 DIAGNOSIS — Z7982 Long term (current) use of aspirin: Secondary | ICD-10-CM

## 2023-07-22 HISTORY — DX: Chronic kidney disease, stage 3 unspecified: N18.30

## 2023-07-22 LAB — RESPIRATORY PANEL BY PCR

## 2023-07-22 LAB — CBC WITH DIFFERENTIAL/PLATELET
Abs Immature Granulocytes: 0.02 10*3/uL (ref 0.00–0.07)
Basophils Absolute: 0 10*3/uL (ref 0.0–0.1)
Basophils Relative: 0 %
Eosinophils Absolute: 0.1 10*3/uL (ref 0.0–0.5)
Eosinophils Relative: 1 %
HCT: 35.1 % — ABNORMAL LOW (ref 39.0–52.0)
Hemoglobin: 10.4 g/dL — ABNORMAL LOW (ref 13.0–17.0)
Immature Granulocytes: 0 %
Lymphocytes Relative: 59 %
Lymphs Abs: 5 10*3/uL — ABNORMAL HIGH (ref 0.7–4.0)
MCH: 26.9 pg (ref 26.0–34.0)
MCHC: 29.6 g/dL — ABNORMAL LOW (ref 30.0–36.0)
MCV: 90.9 fL (ref 80.0–100.0)
Monocytes Absolute: 0.8 10*3/uL (ref 0.1–1.0)
Monocytes Relative: 9 %
Neutro Abs: 2.7 10*3/uL (ref 1.7–7.7)
Neutrophils Relative %: 31 %
Platelets: 156 10*3/uL (ref 150–400)
RBC: 3.86 MIL/uL — ABNORMAL LOW (ref 4.22–5.81)
RDW: 15.3 % (ref 11.5–15.5)
WBC: 8.5 10*3/uL (ref 4.0–10.5)
nRBC: 0 % (ref 0.0–0.2)

## 2023-07-22 LAB — TROPONIN I (HIGH SENSITIVITY)
Troponin I (High Sensitivity): 68 ng/L — ABNORMAL HIGH (ref <18)
Troponin I (High Sensitivity): 64 ng/L — ABNORMAL HIGH (ref <18)
Troponin I (High Sensitivity): 68 ng/L — ABNORMAL HIGH (ref <18)

## 2023-07-22 LAB — I-STAT VENOUS BLOOD GAS, ED
Acid-base deficit: 4 mmol/L — ABNORMAL HIGH (ref 0.0–2.0)
Bicarbonate: 26 mmol/L (ref 20.0–28.0)
Calcium, Ion: 1.1 mmol/L — ABNORMAL LOW (ref 1.15–1.40)
HCT: 36 % — ABNORMAL LOW (ref 39.0–52.0)
Hemoglobin: 12.2 g/dL — ABNORMAL LOW (ref 13.0–17.0)
O2 Saturation: 69 %
Potassium: 7.8 mmol/L (ref 3.5–5.1)
Sodium: 140 mmol/L (ref 135–145)
TCO2: 28 mmol/L (ref 22–32)
pCO2, Ven: 70.5 mm[Hg] (ref 44–60)
pH, Ven: 7.174 — CL (ref 7.25–7.43)
pO2, Ven: 46 mm[Hg] — ABNORMAL HIGH (ref 32–45)

## 2023-07-22 LAB — BASIC METABOLIC PANEL
Anion gap: 7 (ref 5–15)
BUN: 9 mg/dL (ref 8–23)
CO2: 25 mmol/L (ref 22–32)
Calcium: 8.7 mg/dL — ABNORMAL LOW (ref 8.9–10.3)
Chloride: 111 mmol/L (ref 98–111)
Creatinine, Ser: 1.15 mg/dL (ref 0.61–1.24)
GFR, Estimated: >60 mL/min (ref >60)
Glucose, Bld: 115 mg/dL — ABNORMAL HIGH (ref 70–99)
Potassium: 3.9 mmol/L (ref 3.5–5.1)
Sodium: 143 mmol/L (ref 135–145)

## 2023-07-22 LAB — SARS CORONAVIRUS 2 BY RT PCR: SARS Coronavirus 2 by RT PCR: NEGATIVE

## 2023-07-22 LAB — BRAIN NATRIURETIC PEPTIDE
B Natriuretic Peptide: UNDETERMINED pg/mL (ref 0.0–100.0)
B Natriuretic Peptide: 1346.8 pg/mL — ABNORMAL HIGH (ref 0.0–100.0)

## 2023-07-22 MED ORDER — IPRATROPIUM-ALBUTEROL 0.5-2.5 (3) MG/3ML IN SOLN
RESPIRATORY_TRACT | Status: AC
  Filled 2023-07-22: qty 6

## 2023-07-22 MED ORDER — ACETAMINOPHEN 650 MG RE SUPP: 650.0000 mg | Freq: Four times a day (QID) | RECTAL | Status: DC | PRN

## 2023-07-22 MED ORDER — METHYLPREDNISOLONE SODIUM SUCC 125 MG IJ SOLR
125.0000 mg | Freq: Once | INTRAMUSCULAR | Status: AC
  Administered 2023-07-22: 125 mg via INTRAVENOUS
  Filled 2023-07-22: qty 2

## 2023-07-22 MED ORDER — OXYCODONE-ACETAMINOPHEN 5-325 MG PO TABS: 1.0000 | ORAL_TABLET | Freq: Once | ORAL | Status: DC

## 2023-07-22 MED ORDER — POLYETHYLENE GLYCOL 3350 17 G PO PACK: 17.0000 g | PACK | Freq: Every day | ORAL | Status: DC | PRN

## 2023-07-22 MED ORDER — IPRATROPIUM-ALBUTEROL 0.5-2.5 (3) MG/3ML IN SOLN
3.0000 mL | RESPIRATORY_TRACT | Status: DC | PRN
  Administered 2023-07-22 – 2023-07-23 (×3): 3 mL via RESPIRATORY_TRACT
  Filled 2023-07-22 (×3): qty 3

## 2023-07-22 MED ORDER — CLOPIDOGREL BISULFATE 75 MG PO TABS
75.0000 mg | ORAL_TABLET | Freq: Every day | ORAL | Status: DC
  Administered 2023-07-22 – 2023-07-26 (×5): 75 mg via ORAL
  Filled 2023-07-22 (×6): qty 1

## 2023-07-22 MED ORDER — UMECLIDINIUM BROMIDE 62.5 MCG/ACT IN AEPB
1.0000 | INHALATION_SPRAY | Freq: Every day | RESPIRATORY_TRACT | Status: DC
  Administered 2023-07-23: 1 via RESPIRATORY_TRACT
  Filled 2023-07-22: qty 7

## 2023-07-22 MED ORDER — TORSEMIDE 20 MG PO TABS
20.0000 mg | ORAL_TABLET | Freq: Every day | ORAL | Status: DC
  Administered 2023-07-23: 20 mg via ORAL
  Filled 2023-07-22: qty 1

## 2023-07-22 MED ORDER — ASPIRIN 81 MG PO CHEW
81.0000 mg | CHEWABLE_TABLET | Freq: Every day | ORAL | Status: DC
  Administered 2023-07-22 – 2023-07-26 (×5): 81 mg via ORAL
  Filled 2023-07-22 (×5): qty 1

## 2023-07-22 MED ORDER — LORAZEPAM 2 MG/ML IJ SOLN
0.5000 mg | Freq: Once | INTRAMUSCULAR | Status: AC
  Administered 2023-07-22: 0.5 mg via INTRAVENOUS
  Filled 2023-07-22: qty 1

## 2023-07-22 MED ORDER — PREDNISONE 20 MG PO TABS
40.0000 mg | ORAL_TABLET | Freq: Every day | ORAL | Status: AC
  Administered 2023-07-23 – 2023-07-26 (×4): 40 mg via ORAL
  Filled 2023-07-22 (×4): qty 2

## 2023-07-22 MED ORDER — HYDRALAZINE HCL 50 MG PO TABS
50.0000 mg | ORAL_TABLET | Freq: Three times a day (TID) | ORAL | Status: DC
  Administered 2023-07-22 – 2023-07-23 (×2): 50 mg via ORAL
  Filled 2023-07-22 (×2): qty 1

## 2023-07-22 MED ORDER — FLUTICASONE FUROATE-VILANTEROL 200-25 MCG/ACT IN AEPB
1.0000 | INHALATION_SPRAY | Freq: Every day | RESPIRATORY_TRACT | Status: DC
  Administered 2023-07-23: 1 via RESPIRATORY_TRACT
  Filled 2023-07-22: qty 28

## 2023-07-22 MED ORDER — METOPROLOL SUCCINATE ER 50 MG PO TB24
75.0000 mg | ORAL_TABLET | Freq: Two times a day (BID) | ORAL | Status: DC
  Administered 2023-07-22 – 2023-07-26 (×8): 75 mg via ORAL
  Filled 2023-07-22 (×8): qty 1

## 2023-07-22 MED ORDER — RIVAROXABAN 10 MG PO TABS
10.0000 mg | ORAL_TABLET | Freq: Every day | ORAL | Status: DC
  Administered 2023-07-22 – 2023-07-24 (×3): 10 mg via ORAL
  Filled 2023-07-22 (×4): qty 1

## 2023-07-22 MED ORDER — GABAPENTIN 100 MG PO CAPS
200.0000 mg | ORAL_CAPSULE | Freq: Every evening | ORAL | Status: DC | PRN
  Administered 2023-07-24: 200 mg via ORAL
  Filled 2023-07-22: qty 2

## 2023-07-22 MED ORDER — ATORVASTATIN CALCIUM 80 MG PO TABS
80.0000 mg | ORAL_TABLET | Freq: Every day | ORAL | Status: DC
  Administered 2023-07-22 – 2023-07-26 (×5): 80 mg via ORAL
  Filled 2023-07-22 (×6): qty 1

## 2023-07-22 MED ORDER — ACETAMINOPHEN 325 MG PO TABS: 650.0000 mg | ORAL_TABLET | Freq: Four times a day (QID) | ORAL | Status: DC | PRN

## 2023-07-22 MED ORDER — FAMOTIDINE 20 MG PO TABS
40.0000 mg | ORAL_TABLET | Freq: Every evening | ORAL | Status: DC
  Administered 2023-07-22 – 2023-07-24 (×3): 40 mg via ORAL
  Filled 2023-07-22 (×3): qty 2

## 2023-07-22 NOTE — Progress Notes (Signed)
Pt was transitioned from BiPAP to [place] and is tolerating well at this time.

## 2023-07-22 NOTE — ED Notes (Signed)
ED TO INPATIENT HANDOFF REPORT  ED Nurse Name and Phone #: Theophilus Bones [phone]  S Name/Age/Gender [name] 82 y.o. male Room/Bed: 045C/045C  Code Status   Code Status: Prior  Home/SNF/Other Home Patient oriented to: self, place, time, and situation Is this baseline? Yes   Triage Complete: Triage complete  Chief Complaint sick  Triage Note PT BIB [facility], began having SOB around 0730. Aox4, received 20mg  Albuterol en route with 10L O2 via NRB. [facility] vitals BP 236/140, HR 110, CBG 148, T 100, O2: 94%,    Hx of COPD, does not wear O2 at home, wears CPAP at home. 18G LA.    Allergies Allergies  Allergen Reactions   Acyclovir And Related Other (See Comments)    Pt states he is not familiar with this medication.   Aspirin Other (See Comments)    GI upset at high doses only.    Level of Care/Admitting Diagnosis ED Disposition     ED Disposition  Admit   Condition  --   Comment  The patient appears reasonably stabilized for admission considering the current resources, flow, and capabilities available in the ED at this time, and I doubt any other [facility] requiring further screening and/or treatment in the ED prior to admission is  present.          B Medical/Surgery History Past Medical History:  Diagnosis Date   Acute renal failure (HCC) 05/31/2014   Angina decubitus (HCC) 06/15/2014   CAD (coronary artery disease)    a. s/p stent RCA DES 9/09; b. 2014 Attempted PCI of OM1 @ [place];  c. 04/2014 Cath: LAD 40-50p, D1 95-99 (chronic), LCX 30-40 inf branch, OM1 CTO, RCA 30-40p, RCA patent stent, EF 35%->Med Rx.   Chronic combined systolic and diastolic CHF (congestive heart failure) (HCC)    a. EF about 40 to 45% per echo in April 2013;  b. 04/2014 Echo: EF 20-25%, sev LVH, sev glob HK, Gr 1 DD, mildly reduced RV fxn, PASP . c. 01/2017: EF improved to 50-55%.    Chronic low back pain    Complete heart block (HCC)    COPD (chronic obstructive pulmonary  disease) (HCC) dx 06/2013   PFTs 07/08/13: mod obst with resp to bronchodilator, moderate decrease diffusion, airtrapping   COPD with acute exacerbation (HCC) 01/17/2017   COPD with asthma (HCC)    07/08/13 PFT: FEV1 1.74L (66% pred, 30% change with BD), mod obst with resp to bronchodilator, moderate decrease diffusion, air-trapping 11/2013 Simple spiro>> clear obstruction, FEV1 1.30 L (47% pred) - trial of symbicort 160 2bid 01/26/15      Depression 09/15/2018   Assessment: Increased sadness and depression since losing job  Plan: Patient denies suicidal and homicidal ideations Patient would not like to start medication therapies at this time Will establish patient with [facility] for primary care   Dyslipidemia    a. on statin   Essential hypertension    HLD (hyperlipidemia)    HTN (hypertension)    a. Reports intolerance to hydralazine; b. no beta blockers 2/2 bradycardia;  c. failed on ACE and ARB.   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 04/2014 Echo: EF 20-25%, sev glob HK.   Mixed Ischemic/Non-Ischemic Cardiomyopathy    probable mixed ischemic and non-ischemic    Noncompliance    OSA (obstructive sleep  apnea) 04/18/2018   06/11/16 - home sleep study shows AHI of 2.9 an hour with the lowest SaO2 of 79% with an average of 93%  03/08/2018-Home sleep study-AHI 7/HR, SaO2 low 81%    Paroxysmal atrial fibrillation (HCC)    a. identified on device interrogation 01/2016   Pneumonia 09/02/2013   Pulmonary edema 05/10/2014   Respiratory arrest (HCC) 05/18/2014   Sinus bradycardia 05/31/2014   Ventricular tachycardia (HCC)    treated with ATP,  CL 250-300 msec   Past Surgical History:  Procedure Laterality Date   BI-VENTRICULAR IMPLANTABLE CARDIOVERTER DEFIBRILLATOR N/A 08/12/2014   MDT Ovidio Kin XT CRTD implanted by Dr  Johney Frame   BIV ICD GENERATOR CHANGEOUT N/A 08/08/2021   Procedure: BIV ICD GENERATOR CHANGEOUT;  Surgeon: Hillis Range, MD;  Location: [facility] INVASIVE CV LAB;  Service: Cardiovascular;  Laterality: N/A;   CARDIAC CATHETERIZATION     ejection fraction 50%   CORONARY STENT INTERVENTION N/A 12/10/2022   Procedure: CORONARY STENT INTERVENTION;  Surgeon: Swaziland, Peter M, MD;  Location: [facility] INVASIVE CV LAB;  Service: Cardiovascular;  Laterality: N/A;   CORONARY STENT INTERVENTION N/A 12/12/2022   Procedure: CORONARY STENT INTERVENTION;  Surgeon: Swaziland, Peter M, MD;  Location: [facility] INVASIVE CV LAB;  Service: Cardiovascular;  Laterality: N/A;   CORONARY ULTRASOUND/IVUS N/A 12/10/2022   Procedure: Intravascular Ultrasound/IVUS;  Surgeon: Swaziland, Peter M, MD;  Location: [facility] INVASIVE CV LAB;  Service: Cardiovascular;  Laterality: N/A;   CORONARY ULTRASOUND/IVUS N/A 12/12/2022   Procedure: Coronary Ultrasound/IVUS;  Surgeon: Swaziland, Peter M, MD;  Location: [facility] INVASIVE CV LAB;  Service: Cardiovascular;  Laterality: N/A;   LEFT HEART CATH AND CORONARY ANGIOGRAPHY N/A 12/10/2022   Procedure: LEFT HEART CATH AND CORONARY ANGIOGRAPHY;  Surgeon: Swaziland, Peter M, MD;  Location: [facility] INVASIVE CV LAB;  Service: Cardiovascular;  Laterality: N/A;   LEFT HEART CATHETERIZATION WITH CORONARY ANGIOGRAM N/A 05/08/2014   Procedure: LEFT HEART CATHETERIZATION WITH CORONARY ANGIOGRAM;  Surgeon: Micheline Chapman, MD;  Location: [facility] CATH LAB;  Service: Cardiovascular;  Laterality: N/A;     A IV Location/Drains/Wounds Patient Lines/Drains/Airways Status     Active Line/Drains/Airways     Name Placement date Placement time Site Days   Peripheral IV 07/22/23 20 G Anterior;Right Forearm 07/22/23  0828  Forearm  less than 1   Peripheral IV 07/22/23 18 G Anterior;Distal;Left;Upper Arm 07/22/23  0829  Arm  less than 1            Intake/Output Last 24 hours No intake or output data in the 24 hours ending 07/22/23 1606  Labs/Imaging Results  for orders placed or performed during the hospital encounter of 07/22/23 (from the past 48 hour(s))  CBC with Differential/Platelet     Status: Abnormal   Collection Time: 07/22/23  8:32 AM  Result Value Ref Range   WBC 8.5 4.0 - 10.5 K/uL   RBC 3.86 (L) 4.22 - 5.81 MIL/uL   Hemoglobin 10.4 (L) 13.0 - 17.0 g/dL   HCT 40.9 (L) 81.1 - 91.4 %   MCV 90.9 80.0 - 100.0 fL   MCH 26.9 26.0 - 34.0 pg   MCHC 29.6 (L) 30.0 - 36.0 g/dL   RDW 78.2 95.6 - 21.3 %   Platelets 156 150 - 400 K/uL   nRBC 0.0 0.0 - 0.2 %   Neutrophils Relative % 31 %   Neutro Abs 2.7 1.7 - 7.7 K/uL   Lymphocytes Relative 59 %   Lymphs Abs 5.0 (H) 0.7 - 4.0 K/uL  Monocytes Relative 9 %   Monocytes Absolute 0.8 0.1 - 1.0 K/uL   Eosinophils Relative 1 %   Eosinophils Absolute 0.1 0.0 - 0.5 K/uL   Basophils Relative 0 %   Basophils Absolute 0.0 0.0 - 0.1 K/uL   Immature Granulocytes 0 %   Abs Immature Granulocytes 0.02 0.00 - 0.07 K/uL    Comment: Performed at [facility], [address] [place], [place] [address]  Brain natriuretic peptide     Status: None   Collection Time: 07/22/23  8:32 AM  Result Value Ref Range   B Natriuretic Peptide  0.0 - 100.0 pg/mL    SPECIMEN/CONTAINER TYPE INAPPROPRIATE FOR ORDERED TEST, UNABLE TO PERFORM    Comment: NOTIFIED T.APPLEGATE,RN BY CLARK,S AT 1048 07/22/23 / TEST TO BE RECOLLECTED Performed at [facility], [address] [place], [place] [address] CORRECTED ON 11/11 AT 1052: PREVIOUSLY REPORTED AS 1203.5   I-Stat venous blood gas, ([facility], [facility], [facility])     Status: Abnormal   Collection Time: 07/22/23  8:42 AM  Result Value Ref Range   pH, Ven 7.174 (LL) 7.25 - 7.43   pCO2, Ven 70.5 (HH) 44 - 60 mmHg   pO2, Ven 46 (H) 32 - 45 mmHg   Bicarbonate 26.0 20.0 - 28.0 mmol/L   TCO2 28 22 - 32 mmol/L   O2 Saturation 69 %   Acid-base deficit 4.0 (H) 0.0 - 2.0 mmol/L   Sodium 140 135 - 145 mmol/L   Potassium 7.8 (HH) 3.5 - 5.1 mmol/L   Calcium, Ion 1.10 (L) 1.15 -  1.40 mmol/L   HCT 36.0 (L) 39.0 - 52.0 %   Hemoglobin 12.2 (L) 13.0 - 17.0 g/dL   Sample type VENOUS    Comment NOTIFIED PHYSICIAN   Brain natriuretic peptide     Status: Abnormal   Collection Time: 07/22/23 10:50 AM  Result Value Ref Range   B Natriuretic Peptide 1,346.8 (H) 0.0 - 100.0 pg/mL    Comment: Performed at [facility], [address], [place], [place] [address]  Basic metabolic panel     Status: Abnormal   Collection Time: 07/22/23 10:50 AM  Result Value Ref Range   Sodium 143 135 - 145 mmol/L   Potassium 3.9 3.5 - 5.1 mmol/L   Chloride 111 98 - 111 mmol/L   CO2 25 22 - 32 mmol/L   Glucose, Bld 115 (H) 70 - 99 mg/dL    Comment: Glucose reference range applies only to samples taken after fasting for at least 8 hours.   BUN 9 8 - 23 mg/dL   Creatinine, Ser 9.14 0.61 - 1.24 mg/dL   Calcium 8.7 (L) 8.9 - 10.3 mg/dL   GFR, Estimated >78 >29 mL/min    Comment: (NOTE) Calculated using the CKD-EPI Creatinine Equation (2021)    Anion gap 7 5 - 15    Comment: Performed at [facility], [address], [place], [place] [address]  Troponin I (High Sensitivity)     Status: Abnormal   Collection Time: 07/22/23 10:50 AM  Result Value Ref Range   Troponin I (High Sensitivity) 68 (H) <18 ng/L    Comment: (NOTE) Elevated high sensitivity troponin I (hsTnI) values and significant  changes across serial measurements may suggest ACS but many other  chronic and acute conditions are known to elevate hsTnI results.  Refer to the "Links" section for chest pain algorithms and additional  guidance. Performed at [facility],  [address], [place], [place] [address]    DG Chest Port 1 View  Result Date: 07/22/2023 CLINICAL DATA:  Shortness of breath. EXAM: PORTABLE CHEST 1 VIEW COMPARISON:  07/08/2023. FINDINGS: Redemonstration of well-circumscribed round opacity overlying the right lower lung zone, laterally which appears slightly decreased since the prior CT scan  from 06/12/2023 and corresponds to loculated ascites. There is slight interval increase in the nonspecific opacity overlying the right lower lung zone, when compared to the prior radiograph from 07/08/2023, which may represent new atelectasis and/or pneumonitis. Correlate clinically. Bilateral lung fields are otherwise clear. Bilateral costophrenic angles are clear. Stable mildly enlarged cardio-mediastinal silhouette. There is a left sided 3-lead pacemaker. No acute osseous abnormalities. The soft tissues are within normal limits. IMPRESSION: *Slight interval decrease in the loculated ascites along the lateral aspect of the right lower lung zone, when compared to the prior CT scan from 06/12/2023. *Slight interval increase in the nonspecific opacity overlying the right lower lung zone, when compared to the prior radiograph from 07/08/2023, which may represent new atelectasis and/or pneumonitis. Correlate clinically. Follow-up to clearing is recommended. Electronically Signed   By: Jules Schick M.D.   On: 07/22/2023 09:33    Pending Labs Unresulted Labs (From admission, onward)    None       Vitals/Pain Today's Vitals   07/22/23 1415 07/22/23 1456 07/22/23 1500 07/22/23 1515  BP: (!) 123/106  (!) 169/73 (!) 166/86  Pulse:  87 79   Resp: (!) 23  (!) 37 18  Temp:      TempSrc:      SpO2:  100% 100%   Weight:      Height:      PainSc:        Isolation Precautions No active isolations  Medications Medications  oxyCODONE-acetaminophen (PERCOCET/ROXICET) 5-325 MG per tablet 1 tablet (1 tablet Oral Not Given 07/22/23 0914)  ipratropium-albuterol (DUONEB) 0.5-2.5 (3) MG/3ML nebulizer solution (  Given 07/22/23 0837)  methylPREDNISolone sodium succinate (SOLU-MEDROL) 125 mg/2 mL injection 125 mg (125 mg Intravenous Given 07/22/23 0904)  LORazepam (ATIVAN) injection 0.5 mg (0.5 mg Intravenous Given 07/22/23 0903)    Mobility walks     Focused Assessments Pulmonary Assessment  Handoff:  Lung sounds: Bilateral Breath Sounds: Diminished L Breath Sounds: Diminished R Breath Sounds: Diminished O2 Device: Nasal Cannula O2 Flow Rate (L/min): 3 L/min    R Recommendations: See Admitting Provider Note  Report given to:   Additional Notes:

## 2023-07-22 NOTE — Hospital Course (Signed)
This is a 7 male with EMS with shortness of breath.  Breathing got worse and 100% non rebreather. Got on Bipap. BiPAP has helped him at home well.   Trop 68.   CXR did not look like pulmonary edema.   Steriods nd albuterol  PCO2 70 ph 7.1.  11/13 Feels much better today, "100%". No dyspnea. No heart racing or chest pain. No trouble urinating. Inhalers - not taking trelegy, was using bivespi.

## 2023-07-22 NOTE — ED Provider Notes (Addendum)
[place] EMERGENCY DEPARTMENT AT [facility] Provider Note   CSN: [id number] Arrival date & time: 07/22/23  0815     History  Chief Complaint  Patient presents with   Respiratory Distress   Shortness of Breath    [name] is a 82 y.o. male.  Patient brought in by EMS.  Patient began having shortness of breath around 7:30 in the morning.  Patient needs to be placed on 10 L of oxygen nonrebreather and route.  Because his oxygen sats got significantly worse.  Patient initial oxygen sats were 94%.  Patient has a history of COPD wears oxygen at home.  Uses CPAP.  Upon arrival patient was struggling significantly with breathing.  Was placed on BiPAP.  Past medical history significant for left bundle branch coronary artery disease hypertension hyperlipidemia combined systolic diastolic heart failure COPD history of atrial fibrillation complete heart block.  Patient does have a pacemaker.  Patient is still a smoker.  Last attempted to quit in 2014.  Patient last seen in the emergency department October 2 for community-acquired pneumonia right middle lobe and he was admitted to the internal medicine service at that time.  Previously patient was admitted to the September 5 for shortness of breath.  In August 8 for COPD.       Home Medications Prior to Admission medications   Medication Sig Start Date End Date Taking? Authorizing Provider  albuterol (VENTOLIN HFA) 108 (90 Base) MCG/ACT inhaler Inhale 2 puffs into the lungs every 6 (six) hours as needed for wheezing or shortness of breath. Patient taking differently: Inhale 2 puffs into the lungs as needed for wheezing or shortness of breath. 05/20/23   Reymundo Poll, MD  aspirin 81 MG chewable tablet Chew 1 tablet (81 mg total) by mouth daily. 06/07/23   Rocky Morel, DO  atorvastatin (LIPITOR) 80 MG tablet Take 1 tablet (80 mg total) by mouth once daily. 04/29/23   Andrey Farmer, PA-C  clopidogrel (PLAVIX) 75 MG  tablet Take 1 tablet (75 mg total) by mouth daily. 05/21/23   Alexander-Savino, [place], MD  famotidine (PEPCID) 40 MG tablet Take 1 tablet (40 mg total) by mouth every evening. 06/07/23   Rocky Morel, DO  feeding supplement (ENSURE ENLIVE / ENSURE PLUS) LIQD Take 237 mLs by mouth 2 (two) times daily between meals. 05/20/23   Hassan Rowan, [place], MD  Fluticasone-Umeclidin-Vilant (TRELEGY ELLIPTA) 200-62.5-25 MCG/ACT AEPB Inhale 1 Inhalation into the lungs daily. 07/08/23   Mannam, Colbert Coyer, MD  gabapentin (NEURONTIN) 100 MG capsule Take 2 capsules (200 mg total) by mouth at bedtime as needed (for shoulder pain). 06/07/23   Rocky Morel, DO  hydrALAZINE (APRESOLINE) 50 MG tablet Take 1 tablet (50 mg total) by mouth 3 (three) times daily. 06/07/23   Rocky Morel, DO  hydrocortisone cream 1 % Apply topically every 6 (six) hours as needed for itching. 05/20/23   Alexander-Savino, [place], MD  ipratropium-albuterol (DUONEB) 0.5-2.5 (3) MG/3ML SOLN Take 3 mLs by nebulization every 6 (six) hours as needed (wheezing or shortness of breath). 04/19/23   Monna Fam, MD  metoprolol succinate (TOPROL-XL) 25 MG 24 hr tablet Take 3 tablets (75 mg total) by mouth 2 (two) times daily. 05/23/23   Marrianne Mood, MD  polyvinyl alcohol (LIQUIFILM TEARS) 1.4 % ophthalmic solution Place 1 drop into both eyes as needed for dry eyes. 06/07/23   Rocky Morel, DO  torsemide (DEMADEX) 20 MG tablet Take 1 tablet (20 mg total) by mouth daily. 06/07/23  Rocky Morel, DO      Allergies    Acyclovir and related and Aspirin    Review of Systems   Review of Systems  Unable to perform ROS: Severe respiratory distress    Physical Exam Updated Vital Signs BP (!) 123/106   Pulse 92   Temp (!) 97 F (36.1 C) (Axillary)   Resp (!) 23   Ht 1.727 m (5\' 8" )   Wt 65.3 kg   SpO2 100%   BMI 21.90 kg/m  Physical Exam  ED Results / Procedures / Treatments   Labs (all labs ordered are listed, but only abnormal  results are displayed) Labs Reviewed  CBC WITH DIFFERENTIAL/PLATELET - Abnormal; Notable for the following components:      Result Value   RBC 3.86 (*)    Hemoglobin 10.4 (*)    HCT 35.1 (*)    MCHC 29.6 (*)    Lymphs Abs 5.0 (*)    All other components within normal limits  BRAIN NATRIURETIC PEPTIDE - Abnormal; Notable for the following components:   B Natriuretic Peptide 1,346.8 (*)    All other components within normal limits  BASIC METABOLIC PANEL - Abnormal; Notable for the following components:   Glucose, Bld 115 (*)    Calcium 8.7 (*)    All other components within normal limits  I-STAT VENOUS BLOOD GAS, ED - Abnormal; Notable for the following components:   pH, Ven 7.174 (*)    pCO2, Ven 70.5 (*)    pO2, Ven 46 (*)    Acid-base deficit 4.0 (*)    Potassium 7.8 (*)    Calcium, Ion 1.10 (*)    HCT 36.0 (*)    Hemoglobin 12.2 (*)    All other components within normal limits  TROPONIN I (HIGH SENSITIVITY) - Abnormal; Notable for the following components:   Troponin I (High Sensitivity) 68 (*)    All other components within normal limits  BRAIN NATRIURETIC PEPTIDE  TROPONIN I (HIGH SENSITIVITY)  TROPONIN I (HIGH SENSITIVITY)    EKG EKG Interpretation Date/Time:  Monday July 22 2023 09:19:44 EST Ventricular Rate:  73 PR Interval:  56 QRS Duration:  149 QT Interval:  440 QTC Calculation: 485 R Axis:   -85  Text Interpretation: Atrial-sensed ventricular-paced rhythm Multiple premature complexes, vent & supraven Short PR interval IVCD, consider atypical RBBB Probable anterior infarct, age indeterminate Confirmed by Vanetta Mulders [phone]) on 07/22/2023 10:39:23 AM  Radiology DG Chest Port 1 View  Result Date: 07/22/2023 CLINICAL DATA:  Shortness of breath. EXAM: PORTABLE CHEST 1 VIEW COMPARISON:  07/08/2023. FINDINGS: Redemonstration of well-circumscribed round opacity overlying the right lower lung zone, laterally which appears slightly decreased since the  prior CT scan from 06/12/2023 and corresponds to loculated ascites. There is slight interval increase in the nonspecific opacity overlying the right lower lung zone, when compared to the prior radiograph from 07/08/2023, which may represent new atelectasis and/or pneumonitis. Correlate clinically. Bilateral lung fields are otherwise clear. Bilateral costophrenic angles are clear. Stable mildly enlarged cardio-mediastinal silhouette. There is a left sided 3-lead pacemaker. No acute osseous abnormalities. The soft tissues are within normal limits. IMPRESSION: *Slight interval decrease in the loculated ascites along the lateral aspect of the right lower lung zone, when compared to the prior CT scan from 06/12/2023. *Slight interval increase in the nonspecific opacity overlying the right lower lung zone, when compared to the prior radiograph from 07/08/2023, which may represent new atelectasis and/or pneumonitis. Correlate clinically. Follow-up to clearing is recommended.  Electronically Signed   By: Jules Schick M.D.   On: 07/22/2023 09:33    Procedures Procedures    Medications Ordered in ED Medications  oxyCODONE-acetaminophen (PERCOCET/ROXICET) 5-325 MG per tablet 1 tablet (1 tablet Oral Not Given 07/22/23 0914)  ipratropium-albuterol (DUONEB) 0.5-2.5 (3) MG/3ML nebulizer solution (  Given 07/22/23 0837)  methylPREDNISolone sodium succinate (SOLU-MEDROL) 125 mg/2 mL injection 125 mg (125 mg Intravenous Given 07/22/23 0904)  LORazepam (ATIVAN) injection 0.5 mg (0.5 mg Intravenous Given 07/22/23 9629)    ED Course/ Medical Decision Making/ A&P                                 Medical Decision Making Amount and/or Complexity of Data Reviewed Labs: ordered. Radiology: ordered.  Risk Prescription drug management. Decision regarding hospitalization.   Patient arrived in significant respiratory distress.  Patient's venous blood gas showed pH of 7.1 with pCO2 of 70.  Patient started on BiPAP.   CBC no leukocytosis hemoglobin 18.4.  Patient's BNP elevated at 1346 so there could have been a component of CHF as well.  But his chest x-ray was read as slight decrease loculated ascites along the lateral aspect of the right lower lung zone.  Slight increase in nonspecific opacity overlying the right lower lung zone when compared to prior radiograph which may represent some pneumonitis.  No evidence of any significant pulmonary edema.  Patient is troponin was 68.  Delta troponins pending.  On the BiPAP patient has improved significantly.  EMS gave him albuterol and route.  His blood sugar and route was 148 his blood pressure was 126/140.  Currently blood pressure now improved 160/74 without any significant intervention.  Patient was admitted by the internal medicine residency service the beginning of October.  Will recontact them for readmission.  CRITICAL CARE Performed by: Vanetta Mulders Total critical care time: 45 minutes Critical care time was exclusive of separately billable procedures and treating other patients. Critical care was necessary to treat or prevent imminent or life-threatening deterioration. Critical care was time spent personally by me on the following activities: development of treatment plan with patient and/or surrogate as well as nursing, discussions with consultants, evaluation of patient's response to treatment, examination of patient, obtaining history from patient or surrogate, ordering and performing treatments and interventions, ordering and review of laboratory studies, ordering and review of radiographic studies, pulse oximetry and re-evaluation of patient's condition.  Final Clinical Impression(s) / ED Diagnoses Final diagnoses:  COPD exacerbation (HCC)  Acute respiratory failure with hypercapnia [facility])    Rx / DC Orders ED Discharge Orders     None         Vanetta Mulders, MD 07/22/23 1447    Vanetta Mulders, MD 07/22/23 1452

## 2023-07-22 NOTE — H&P (Signed)
Date: 07/22/2023               Patient Name:  [name] MRN: [id number]  DOB: Mar 02, 1941 Age / Sex: 82 y.o., male   PCP: Rocky Morel, DO         Medical Service: Internal Medicine Teaching Service         Attending Physician: Dr. Dickie La    First Contact: Dr. Christie Nottingham Pager: [phone]  Second Contact: Dr. Modena Slater Pager: [phone]       After Hours (After 5p/  First Contact Pager: [phone]  weekends / holidays): Second Contact Pager: [phone]   Chief Complaint: Respiratory distress  History of Present Illness: Mr [name] is a 82 year old man with pmh significant for COPD, HFrEF (combined diastolic and systolic), CAD s/p stents, s/p AICD implant, depression, HLD, HTN, LBB, OSA (not on cpap), ventricular tachycardia, paroxysmal afib who presented to the ED in acute respiratory distress.   Began breathing poorly at 730 am. De-satted down enroute and has been stabilized on BIPAP since coming to the ED. He has had a chest xray, EKG, and was breathing comfortably on [place] O2 in the ED during the exam.  He states that he was taking a shower this morning and then he went to fix him some breakfast. He states that he then started feeling dizzy and his heart started beating fast. He states that he then felt his shortness of breath. He states that he then took a medicine that he does not recall. He states that he then drank some water. He states then he started to use his nebulizer. Used it for 5 mins before he felt that it did not work so he decided to call EMS. He states that yesterday he felt fine and when he woke up this morning he was fine as well. He states that he felt that his heart rate just sped up. He states that he felt that something was wrong. He states that when the EMS people came they put him on a non-rebreather mask. He states that he has a CPAP at home but he reports that he does not use it. He denies any recent illnesses. He denies being around anyone sick and  he denies any nausea. He states that his previous COPD exacerbations in the past feel similar to his presentation today but this one is lighter. He states that he cuts his lasix in half. He states that he uses a laxative and cleans out his bowel every few weeks. He denies any abdominal pain. He states that he is concerned about some watery eyes. He denies any long car car ride or any plane rides. He states that this shortness of breath is very sudden onset which is different. He states that he reports that he is normally very mobile.   ED course:    Meds:  Albuterol 2 puffs q6h PRN- need refill Aspirin 81 mg- taking, need refill  Atorvastatin 80 daily-need refill Plavix 75 mg daily-need refill Pepcid 40 mg daily Trelegy taking every day  Gabapentin 200 mg daily  Hydralazine 50 mg TID (Takes two times a day)  Metoprolol Succinate 75 mg BID Torsemide 20 mg daily  albuterol (VENTOLIN HFA) 108 (90 Base) MCG/ACT inhaler Inhale 2 puffs into the lungs every 6 (six) hours as needed for wheezing or shortness of breath. Patient taking differently: Inhale 2 puffs into the lungs as needed for wheezing or shortness of breath. 05/20/23 Reymundo Poll,  MD  aspirin 81 MG chewable tablet Chew 1 tablet (81 mg total) by mouth daily. 06/07/23 Rocky Morel, DO  atorvastatin (LIPITOR) 80 MG tablet Take 1 tablet (80 mg total) by mouth once daily. 04/29/23 Andrey Farmer, PA-C  clopidogrel (PLAVIX) 75 MG tablet Take 1 tablet (75 mg total) by mouth daily. 05/21/23 Alexander-Savino, [place], MD  famotidine (PEPCID) 40 MG tablet Take 1 tablet (40 mg total) by mouth every evening. 06/07/23 Rocky Morel, DO  feeding supplement (ENSURE ENLIVE / ENSURE PLUS) LIQD Take 237 mLs by mouth 2 (two) times daily between meals. 05/20/23 Hassan Rowan, [place], MD  Fluticasone-Umeclidin-Vilant (TRELEGY ELLIPTA) 200-62.5-25 MCG/ACT AEPB Inhale 1 Inhalation into the lungs daily. 07/08/23 Mannam, Colbert Coyer, MD  gabapentin  (NEURONTIN) 100 MG capsule Take 2 capsules (200 mg total) by mouth at bedtime as needed (for shoulder pain). 06/07/23 Rocky Morel, DO  hydrALAZINE (APRESOLINE) 50 MG tablet Take 1 tablet (50 mg total) by mouth 3 (three) times daily. 06/07/23 Rocky Morel, DO  hydrocortisone cream 1 % Apply topically every 6 (six) hours as needed for itching. 05/20/23 Alexander-Savino, [place], MD  ipratropium-albuterol (DUONEB) 0.5-2.5 (3) MG/3ML SOLN Take 3 mLs by nebulization every 6 (six) hours as needed (wheezing or shortness of breath). 04/19/23 Monna Fam, MD  metoprolol succinate (TOPROL-XL) 25 MG 24 hr tablet Take 3 tablets (75 mg total) by mouth 2 (two) times daily. 05/23/23 Marrianne Mood, MD  polyvinyl alcohol (LIQUIFILM TEARS) 1.4 % ophthalmic solution Place 1 drop into both eyes as needed for dry eyes. 06/07/23 Rocky Morel, DO  torsemide (DEMADEX) 20 MG tablet Take 1 tablet (20 mg total) by mouth daily. 06/07/23 Rocky Morel, DO     Allergies: Allergies as of 07/22/2023 - Review Complete 07/22/2023  Allergen Reaction Noted   Acyclovir and related Other (See Comments) 08/12/2014   Aspirin Other (See Comments) 12/13/2011   Past Medical History:  Diagnosis Date   Acute renal failure (HCC) 05/31/2014   Angina decubitus (HCC) 06/15/2014   CAD (coronary artery disease)    a. s/p stent RCA DES 9/09; b. 2014 Attempted PCI of OM1 @ [facility];  c. 04/2014 Cath: LAD 40-50p, D1 95-99 (chronic), LCX 30-40 inf branch, OM1 CTO, RCA 30-40p, RCA patent stent, EF 35%->Med Rx.   Chronic combined systolic and diastolic CHF (congestive heart failure) (HCC)    a. EF about 40 to 45% per echo in April 2013;  b. 04/2014 Echo: EF 20-25%, sev LVH, sev glob HK, Gr 1 DD, mildly reduced RV fxn, PASP . c. 01/2017: EF improved to 50-55%.    Chronic low back pain    Complete heart block (HCC)    COPD (chronic obstructive pulmonary disease) (HCC) dx 06/2013   PFTs 07/08/13: mod obst with resp to bronchodilator,  moderate decrease diffusion, airtrapping   COPD with acute exacerbation (HCC) 01/17/2017   COPD with asthma (HCC)    07/08/13 PFT: FEV1 1.74L (66% pred, 30% change with BD), mod obst with resp to bronchodilator, moderate decrease diffusion, air-trapping 11/2013 Simple spiro>> clear obstruction, FEV1 1.30 L (47% pred) - trial of symbicort 160 2bid 01/26/15      Depression 09/15/2018   Assessment: Increased sadness and depression since losing job  Plan: Patient denies suicidal and homicidal ideations Patient would not like to start medication therapies at this time Will establish patient with [facility] for primary care   Dyslipidemia    a. on statin   Essential hypertension    HLD (hyperlipidemia)    HTN (  hypertension)    a. Reports intolerance to hydralazine; b. no beta blockers 2/2 bradycardia;  c. failed on ACE and ARB.   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 04/2014 Echo: EF 20-25%, sev glob HK.   Mixed Ischemic/Non-Ischemic Cardiomyopathy    probable mixed ischemic and non-ischemic    Noncompliance    OSA (obstructive sleep apnea) 04/18/2018   06/11/16 - home sleep study shows AHI of 2.9 an hour with the lowest SaO2 of 79% with an average of 93%  03/08/2018-Home sleep study-AHI 7/HR, SaO2 low 81%    Paroxysmal atrial fibrillation (HCC)    a. identified on device interrogation 01/2016   Pneumonia 09/02/2013   Pulmonary edema 05/10/2014   Respiratory arrest (HCC) 05/18/2014   Sinus bradycardia 05/31/2014   Ventricular tachycardia (HCC)    treated with ATP,  CL 250-300 msec    Family History:  No pertinent family history   Social History:  Lives with: Grandson in [place], [place] Occupation: Security at [employer]  Support: Support with his grandson  Substance: Uses Cigarettes 1-2 day, on and off for 67 years  smoking, No alcohol, no drug use  PCP: Rocky Morel, DO Level of function: Independent in all ADLs and iADLs   Review of Systems: A complete ROS was negative except as per HPI.   Physical Exam: Blood pressure (!) 123/106, pulse 92, temperature (!) 97 F (36.1 C), temperature source Axillary, resp. rate (!) 23, height 5\' 8"  (1.727 m), weight 65.3 kg, SpO2 100%. No edema or swelling.   Physical Exam Vitals and nursing note reviewed.  Constitutional:      General: He is not in acute distress.    Appearance: He is normal weight. He is ill-appearing.  Eyes:     Extraocular Movements: Extraocular movements intact.     Pupils: Pupils are equal, round, and reactive to light.  Neck:     Vascular: No hepatojugular reflux or JVD.  Cardiovascular:     Rate and Rhythm: Tachycardia present. Rhythm irregular.     Pulses: No decreased pulses.     Comments: AICD visible, ~4 years left on battery Pulmonary:     Effort: Pulmonary effort is normal. No tachypnea, bradypnea, accessory muscle usage or respiratory distress.     Breath sounds: Examination of the right-lower field reveals decreased breath sounds. Examination of the left-lower field reveals decreased breath sounds. Decreased breath sounds present. No wheezing, rhonchi or rales.     Comments: [place], satting at 100. Turned down to 2L from 6L. Pt remained comfortable. Chest:     Chest wall: Mass present.  Abdominal:     General: Bowel sounds are normal.     Palpations: Abdomen is soft.  Musculoskeletal:     Right lower leg: No edema.     Left lower leg: No edema.  Skin:    General: Skin is warm and dry.  Neurological:     General: No focal deficit present.     Mental Status: He is alert and oriented to person, place, and time.  Psychiatric:        Mood and Affect: Mood normal.        Behavior: Behavior normal.     Comments: Pt is sad and somewhat anxious in the setting of the loss of his wife and his own declining health.     11/11  DG Chest Port 1 View CLINICAL DATA:  Shortness of breath. COMPARISON:  07/08/2023. Impression: *Slight interval decrease in the loculated ascites along the lateral aspect of the right lower lung zone, when compared to the prior CT scan from 06/12/2023. *Slight interval increase in the nonspecific opacity overlying the right lower lung zone, when compared to the prior radiograph from 07/08/2023, which may represent new atelectasis and/or pneumonitis. Correlate clinically. Follow-up to clearing is recommended.  EKG: personally reviewed my interpretation is ventricular paced rhythm, short pr interval, RBBB.  CXR: personally reviewed my interpretation is: mostly chronic worsening of underlying known pathologies, nothing which explains his sudden symptoms.  Assessment & Plan by Problem:  COPD Exacerbation, Respiratory Acidosis Pt presenting with sudden onset shortness of breath without obvious proximate cause. Patient has not been or been around sick contacts. Pt wears mask around others. DVT/PE is low on suspicion list given patient's activity levels and DAPT therapy. Possible bronchospasm leading to worsening atelectasis and pneumonitis. Pt showing hypercarbia and acidosis on blood gases. Will treat with BIPAP at night to assist with respiratory acidosis. - Admit to OBS status, IMTS, Attending Dr Sol Blazing - Respiratory panel + covid test ordered - Rcd' one dose of methylprednisolone, receive 4 more doses of PO prednisone for a 5 day steroid course - BIPAP therapy at night to assist with patient's respiratory hypercarbia - duonebs 0.5-2.5 mg/ml -3 ml nebs Q4  Chronic Conditions: HTN: hydralazine 50 TID plus metoprolol 75 BID (from his last hospitalization, this was his discharge dose) GERD: famotidine 40 mg daily CHF: torsemide 20 mg daily HLD: atorvastatin 80 mg daily CAD: clopidogrel 75 mg daily , aspirin 81 mg daily Chronic Pain: gabapentin 200 at bedtime, acetaminophen Q6prn    FEN: Heart  healthy diet. VTE: xarelto 10 Code status: Full. Discussed with patient. Dispo: Home with grandson  Dispo: Admit patient to Observation with expected length of stay less than 2 midnights.  Signed: Margaretmary Dys, MD 07/22/2023, 2:51 PM  Pager: [phone] After 5pm on weekdays and 1pm on weekends: On Call pager: [phone]

## 2023-07-22 NOTE — ED Triage Notes (Signed)
PT BIB [facility], began having SOB around 0730. Aox4, received 20mg  Albuterol en route with 10L O2 via NRB. [facility] vitals BP 236/140, HR 110, CBG 148, T 100, O2: 94%,    Hx of COPD, does not wear O2 at home, wears CPAP at home. 18G LA.

## 2023-07-23 ENCOUNTER — Observation Stay (HOSPITAL_COMMUNITY): Payer: Self-pay

## 2023-07-23 DIAGNOSIS — I255 Ischemic cardiomyopathy: Secondary | ICD-10-CM | POA: Diagnosis present

## 2023-07-23 DIAGNOSIS — J9602 Acute respiratory failure with hypercapnia: Secondary | ICD-10-CM | POA: Diagnosis present

## 2023-07-23 DIAGNOSIS — E8729 Other acidosis: Secondary | ICD-10-CM | POA: Diagnosis present

## 2023-07-23 DIAGNOSIS — G8929 Other chronic pain: Secondary | ICD-10-CM | POA: Diagnosis present

## 2023-07-23 DIAGNOSIS — I428 Other cardiomyopathies: Secondary | ICD-10-CM | POA: Diagnosis present

## 2023-07-23 DIAGNOSIS — I10 Essential (primary) hypertension: Secondary | ICD-10-CM

## 2023-07-23 DIAGNOSIS — R0603 Acute respiratory distress: Secondary | ICD-10-CM

## 2023-07-23 DIAGNOSIS — F17210 Nicotine dependence, cigarettes, uncomplicated: Secondary | ICD-10-CM

## 2023-07-23 DIAGNOSIS — I5042 Chronic combined systolic (congestive) and diastolic (congestive) heart failure: Secondary | ICD-10-CM | POA: Diagnosis present

## 2023-07-23 DIAGNOSIS — F32A Depression, unspecified: Secondary | ICD-10-CM | POA: Diagnosis present

## 2023-07-23 DIAGNOSIS — Z95810 Presence of automatic (implantable) cardiac defibrillator: Secondary | ICD-10-CM | POA: Diagnosis not present

## 2023-07-23 DIAGNOSIS — G4733 Obstructive sleep apnea (adult) (pediatric): Secondary | ICD-10-CM | POA: Diagnosis present

## 2023-07-23 DIAGNOSIS — Z79899 Other long term (current) drug therapy: Secondary | ICD-10-CM | POA: Diagnosis not present

## 2023-07-23 DIAGNOSIS — Z1152 Encounter for screening for COVID-19: Secondary | ICD-10-CM | POA: Diagnosis not present

## 2023-07-23 DIAGNOSIS — I2510 Atherosclerotic heart disease of native coronary artery without angina pectoris: Secondary | ICD-10-CM | POA: Diagnosis present

## 2023-07-23 DIAGNOSIS — I130 Hypertensive heart and chronic kidney disease with heart failure and stage 1 through stage 4 chronic kidney disease, or unspecified chronic kidney disease: Secondary | ICD-10-CM | POA: Diagnosis present

## 2023-07-23 DIAGNOSIS — J9601 Acute respiratory failure with hypoxia: Secondary | ICD-10-CM | POA: Diagnosis present

## 2023-07-23 DIAGNOSIS — N179 Acute kidney failure, unspecified: Secondary | ICD-10-CM | POA: Diagnosis present

## 2023-07-23 DIAGNOSIS — J441 Chronic obstructive pulmonary disease with (acute) exacerbation: Secondary | ICD-10-CM | POA: Diagnosis present

## 2023-07-23 DIAGNOSIS — I472 Ventricular tachycardia, unspecified: Secondary | ICD-10-CM | POA: Diagnosis present

## 2023-07-23 DIAGNOSIS — I447 Left bundle-branch block, unspecified: Secondary | ICD-10-CM | POA: Diagnosis present

## 2023-07-23 DIAGNOSIS — I480 Paroxysmal atrial fibrillation: Secondary | ICD-10-CM | POA: Diagnosis present

## 2023-07-23 DIAGNOSIS — E785 Hyperlipidemia, unspecified: Secondary | ICD-10-CM | POA: Diagnosis present

## 2023-07-23 DIAGNOSIS — N1831 Chronic kidney disease, stage 3a: Secondary | ICD-10-CM | POA: Diagnosis present

## 2023-07-23 DIAGNOSIS — K219 Gastro-esophageal reflux disease without esophagitis: Secondary | ICD-10-CM | POA: Diagnosis present

## 2023-07-23 DIAGNOSIS — J9811 Atelectasis: Secondary | ICD-10-CM | POA: Diagnosis present

## 2023-07-23 LAB — BASIC METABOLIC PANEL
Anion gap: 15 (ref 5–15)
Anion gap: 11 (ref 5–15)
BUN: 21 mg/dL (ref 8–23)
BUN: 26 mg/dL — ABNORMAL HIGH (ref 8–23)
CO2: 18 mmol/L — ABNORMAL LOW (ref 22–32)
CO2: 25 mmol/L (ref 22–32)
Calcium: 9.5 mg/dL (ref 8.9–10.3)
Calcium: 9.9 mg/dL (ref 8.9–10.3)
Chloride: 107 mmol/L (ref 98–111)
Chloride: 104 mmol/L (ref 98–111)
Creatinine, Ser: 1.37 mg/dL — ABNORMAL HIGH (ref 0.61–1.24)
Creatinine, Ser: 1.72 mg/dL — ABNORMAL HIGH (ref 0.61–1.24)
GFR, Estimated: 52 mL/min — ABNORMAL LOW (ref >60)
GFR, Estimated: 40 mL/min — ABNORMAL LOW (ref >60)
Glucose, Bld: 125 mg/dL — ABNORMAL HIGH (ref 70–99)
Glucose, Bld: 108 mg/dL — ABNORMAL HIGH (ref 70–99)
Potassium: 5.1 mmol/L (ref 3.5–5.1)
Potassium: 4.6 mmol/L (ref 3.5–5.1)
Sodium: 140 mmol/L (ref 135–145)
Sodium: 140 mmol/L (ref 135–145)

## 2023-07-23 LAB — CBC
HCT: 34.2 % — ABNORMAL LOW (ref 39.0–52.0)
Hemoglobin: 10.5 g/dL — ABNORMAL LOW (ref 13.0–17.0)
MCH: 27.9 pg (ref 26.0–34.0)
MCHC: 30.7 g/dL (ref 30.0–36.0)
MCV: 90.7 fL (ref 80.0–100.0)
Platelets: 155 10*3/uL (ref 150–400)
RBC: 3.77 MIL/uL — ABNORMAL LOW (ref 4.22–5.81)
RDW: 15.5 % (ref 11.5–15.5)
WBC: 9.5 10*3/uL (ref 4.0–10.5)
nRBC: 0.2 % (ref 0.0–0.2)

## 2023-07-23 LAB — BLOOD GAS, VENOUS
Acid-base deficit: 5.6 mmol/L — ABNORMAL HIGH (ref 0.0–2.0)
Bicarbonate: 21.9 mmol/L (ref 20.0–28.0)
O2 Saturation: 94 %
Patient temperature: 37.4
pCO2, Ven: 51 mm[Hg] (ref 44–60)
pH, Ven: 7.24 — ABNORMAL LOW (ref 7.25–7.43)
pO2, Ven: 78 mm[Hg] — ABNORMAL HIGH (ref 32–45)

## 2023-07-23 LAB — ECHOCARDIOGRAM LIMITED
Area-P 1/2: 4.29 cm2
Height: 68 in
S' Lateral: 3.7 cm
Weight: 2304 [oz_av]

## 2023-07-23 MED ORDER — IPRATROPIUM-ALBUTEROL 0.5-2.5 (3) MG/3ML IN SOLN
3.0000 mL | RESPIRATORY_TRACT | Status: DC
  Administered 2023-07-23 (×4): 3 mL via RESPIRATORY_TRACT
  Filled 2023-07-23 (×3): qty 3

## 2023-07-23 MED ORDER — HYDRALAZINE HCL 50 MG PO TABS
75.0000 mg | ORAL_TABLET | Freq: Three times a day (TID) | ORAL | Status: DC
  Administered 2023-07-23 – 2023-07-26 (×9): 75 mg via ORAL
  Filled 2023-07-23 (×9): qty 1

## 2023-07-23 MED ORDER — IPRATROPIUM-ALBUTEROL 0.5-2.5 (3) MG/3ML IN SOLN
RESPIRATORY_TRACT | Status: AC
  Filled 2023-07-23: qty 3

## 2023-07-23 MED ORDER — BUDESONIDE 0.5 MG/2ML IN SUSP
0.5000 mg | Freq: Two times a day (BID) | RESPIRATORY_TRACT | Status: DC
  Administered 2023-07-23 – 2023-07-24 (×3): 0.5 mg via RESPIRATORY_TRACT
  Filled 2023-07-23 (×3): qty 2

## 2023-07-23 MED ORDER — FUROSEMIDE 10 MG/ML IJ SOLN
40.0000 mg | Freq: Once | INTRAMUSCULAR | Status: AC
  Administered 2023-07-23: 40 mg via INTRAVENOUS
  Filled 2023-07-23: qty 4

## 2023-07-23 MED ORDER — BUDESONIDE 0.5 MG/2ML IN SUSP
RESPIRATORY_TRACT | Status: AC
  Filled 2023-07-23: qty 2

## 2023-07-23 MED ORDER — HYDROXYZINE HCL 25 MG PO TABS
25.0000 mg | ORAL_TABLET | Freq: Two times a day (BID) | ORAL | Status: DC
  Administered 2023-07-23 – 2023-07-24 (×3): 25 mg via ORAL
  Filled 2023-07-23 (×3): qty 1

## 2023-07-23 MED ORDER — PANTOPRAZOLE SODIUM 40 MG PO TBEC
40.0000 mg | DELAYED_RELEASE_TABLET | Freq: Two times a day (BID) | ORAL | Status: DC
  Administered 2023-07-23 – 2023-07-26 (×7): 40 mg via ORAL
  Filled 2023-07-23 (×8): qty 1

## 2023-07-23 NOTE — Progress Notes (Signed)
Pt placed on bipap per physician order. Pt to be transferred to high level of care. RT will continue to monitor and be available as needed.

## 2023-07-23 NOTE — Progress Notes (Signed)
Secretary called nurse and reported patient is "having trouble breathing." Upon entering room, patient found to be having labored breathing with accessory muscle use. Patient assessed and audible wheezing present. Respiratory team called. Per respiratory, they are "unable to come at the moment" but they will come assess. PRN nebulizer treatment given by primary nurse. Patient says nebulizer treatment "helped a little, but I think my lungs are collapsing again." Provider made aware of patient's current status and concerns via secure chat. Nurse awaiting response. Patient's bed lowered and call bell within reach.

## 2023-07-23 NOTE — Progress Notes (Signed)
   07/23/23 0900  Spiritual Encounters  Type of Visit Initial  Care provided to: Patient  Referral source Patient request  Reason for visit Routine spiritual support  OnCall Visit No  Spiritual Framework  Presenting Themes Meaning/purpose/sources of inspiration  Values/beliefs faith  Community/Connection Family  Needs/Challenges/Barriers difficulty breathing  Patient Stress Factors Loss;Loss of control  Interventions  Spiritual Care Interventions Made Compassionate presence;Reflective listening;Normalization of emotions;Meaning making;Prayer  Intervention Outcomes  Outcomes Connection to spiritual care;Awareness of support   Chaplain responded to request for emotional and spiritual support. There was no family present at bedside. Patient is frustrated because he has to deal with the same problem. He says that he would rather the care team had left the tube from the prior procedure in his chest so that his lungs do not collapse. Patient lost his spouse in June he is still grieving the loss. He is praying that God allow him to see his birthday and many more years. Chaplain helped patient process his feelings and provided hospitality. Chaplain offered a word of prayer. Patient expressed appreciation. No follow-up needed.

## 2023-07-23 NOTE — Significant Event (Signed)
Rapid Response Event Note   Reason for Call :  Respiratory distress  Initial Focused Assessment:  Patient is alert and oriented.  He has increased work of breathing and is using accessory muscles.  Lung sounds with expiratory wheezes.   BP 194/105  HR 92  RR 24  O2 sat 100% on 5L [place]  Temp 97.1     Interventions:  Respiratory treatments Placed on Bipap  Transfer to progressive care, rm 6E05  Plan of Care:     Event Summary:   MD Notified: MD at bedside Call Time: 1105 Arrival Time: 1115 End Time: 1210  Marcellina Millin, RN

## 2023-07-23 NOTE — Progress Notes (Signed)
This nurse was approached by resident for STAT nebs. This nurse administered duoneb via override for emergent administration with MD team at bedside. Patient is requiring BIPAP per MD and patient will be transferred to higher level of care. This nurse stayed at bedside with patient. Respiratory coming to see patient for bipap and ned admin.

## 2023-07-23 NOTE — Progress Notes (Signed)
Pt transported on bipap from 2W10 to 6E05 without complication. RT, RN x2 and MD accompanied patient. Report given to 6E RT. RT will continue to be available as needed.

## 2023-07-23 NOTE — Progress Notes (Signed)
Order placed to put pt on bipap. Pt given scheduled maintenance inhalers, prn recently given. Pt [place] titrated from 2L to 5L for comfort and to help with sob. Pt endorses that he is feeling better at this time. Discussed with pt purpose of bipap and benefits. Pt refuses to wear at this time, that he wore yesterday and he felt like he was suffocating. Education provided again to pt to which he continued to refuse bipap at this time. RT will continue to monitor and be available as needed.

## 2023-07-23 NOTE — Progress Notes (Signed)
Secretary called nurse and reported "patient says he's having trouble breathing again." Upon entering room, patient found to be having labored breathing with accessory muscle use. Patient O2 saturations 94% on 3L O2. Provider made aware via secure chat and rapid response nurse called per provider. Respiratory team also called and charge nurse made aware. Provider, respiratory team, and charge nurse at bedside. Report given to 6 east nurse as patient will be transferred to higher level of care per provider. Bed lowered and call bell within reach.

## 2023-07-23 NOTE — Progress Notes (Addendum)
Subjective:  Met patient bedside with IMTS rounding team, attending physician Dr Dickie La. Pt had recently received 2x inhalers and his morning dose of prednisone. He was still having symptoms of acid reflux. No nausea, but pain.   Paged back to bedside by nurse approximately 2 hours after rounds to re-assess patient for worsening respiratory distress. Directed nursing to call rapid response / RT. When I arrived, patient had visible air hunger and was using accessory muscles of breathing. Pt was anxious and feeling "that his lung had collapsed again."  Instructed nursing to restart him on BIPAP and get another EKG. Patient was understandably distressed. The team ordered a repeat CXR, VBGs, and additional nebulizer treatments.  Reassessed patient at 1500, patient was sitting comfortably on 4L [place] O2. Pt had just finished a meal and said he is "back to 90%" of his home baseline. Confirmed that he was no longer in distress, and discussed that our plan for the evening is to stay off bipap unless he has worsening respiratory distress.  Objective:  Vital signs in last 24 hours: Vitals:   07/23/23 1128 07/23/23 1137 07/23/23 1149 07/23/23 1209  BP: (!) 194/105 (!) 161/75 (!) 177/86 (!) 178/88  Pulse: 92 77 84 82  Resp:  (!) 24  16  Temp: (!) 97.1 F (36.2 C)   (!) 97 F (36.1 C)  TempSrc: Axillary   Axillary  SpO2:  100% 100% 100%  Weight:      Height:       Physical Exam Vitals and nursing note reviewed.  Constitutional:      General: He is in acute distress.     Appearance: He is ill-appearing.  Eyes:     Extraocular Movements: Extraocular movements intact.     Pupils: Pupils are equal, round, and reactive to light.  Cardiovascular:     Rate and Rhythm: Regular rhythm. Tachycardia present.  Pulmonary:     Effort: Tachypnea, accessory muscle usage and respiratory distress present.     Breath sounds: Wheezing present.  Chest:     Chest wall: Mass present.  Abdominal:      General: Bowel sounds are normal.     Palpations: Abdomen is soft.  Musculoskeletal:        General: Normal range of motion.  Skin:    General: Skin is warm and dry.     Nails: There is clubbing.  Neurological:     General: No focal deficit present.     Mental Status: He is alert.  Psychiatric:        Mood and Affect: Mood is anxious.    DG CHEST PORT 1 VIEW CLINICAL DATA:  Shortness of breath.  EXAM: PORTABLE CHEST 1 VIEW  COMPARISON:  July 22, 2023.  June 12, 2023.  FINDINGS: Stable cardiomediastinal silhouette. Left-sided fibrillator is unchanged. Stable rounded density seen laterally in right midlung consistent with loculated effusion in right minor fissure as noted on prior CT scan. Left lung is clear. Minimal right basilar subsegmental atelectasis or scarring is noted.  IMPRESSION: Minimal right basilar subsegmental atelectasis or scarring. Stable rounded density seen laterally in right midlung consistent with loculated effusion right minor fissure as noted on prior CT scan.  Electronically Signed   By: Lupita Raider M.D.   On: 07/23/2023 13:55 ECHOCARDIOGRAM LIMITED    ECHOCARDIOGRAM LIMITED REPORT       Patient Name:   [name] [facility] Date of Exam: 07/23/2023 Medical Rec #:  [id number]  Height:       68.0 in Accession #:    [id number]     Weight:       144.0 lb Date of Birth:  Oct 20, 1940     BSA:          1.777 m Patient Age:    82 years       BP:           178/88 mmHg Patient Gender: M              HR:           66 bpm. Exam Location:  Inpatient  Procedure: Limited Echo, Color Doppler and Cardiac Doppler  Indications:    Acute rest distress R06.03   History:        Patient has prior history of Echocardiogram examinations, most                 recent 04/03/2023. Cardiomyopathy, CAD, COPD; Risk                 Factors:Dyslipidemia and Hypertension.   Sonographer:    Harriette Bouillon RDCS Referring Phys: [id number] GRACE LAU  IMPRESSIONS   1.  Left ventricular ejection fraction, by estimation, is 45 to 50%. The left ventricle has mildly decreased function. The left ventricle demonstrates global hypokinesis. There is severe concentric left ventricular hypertrophy.  2. Right ventricular systolic function is normal. The right ventricular size is normal. There is mildly elevated pulmonary artery systolic pressure. The estimated right ventricular systolic pressure is 38.6 mmHg.  3. The mitral valve is degenerative. Mild to moderate mitral valve regurgitation. No evidence of mitral stenosis.  4. The aortic valve is normal in structure. Aortic valve regurgitation is not visualized. No aortic stenosis is present.  5. The inferior vena cava is dilated in size with <50% respiratory variability, suggesting right atrial pressure of 15 mmHg.  FINDINGS  Left Ventricle: Left ventricular ejection fraction, by estimation, is 45 to 50%. The left ventricle has mildly decreased function. The left ventricle demonstrates global hypokinesis. The left ventricular internal cavity size was normal in size. There is  severe concentric left ventricular hypertrophy.  Right Ventricle: The right ventricular size is normal. No increase in right ventricular wall thickness. Right ventricular systolic function is normal. There is mildly elevated pulmonary artery systolic pressure. The tricuspid regurgitant velocity is 2.43  m/s, and with an assumed right atrial pressure of 15 mmHg, the estimated right ventricular systolic pressure is 38.6 mmHg.  Left Atrium: Left atrial size was normal in size.  Right Atrium: Right atrial size was normal in size.  Pericardium: There is no evidence of pericardial effusion.  Mitral Valve: The mitral valve is degenerative in appearance. There is mild thickening of the mitral valve leaflet(s). Mild to moderate mitral valve regurgitation. No evidence of mitral valve stenosis.  Tricuspid Valve: The tricuspid valve is normal in structure.  Tricuspid valve regurgitation is mild . No evidence of tricuspid stenosis.  Aortic Valve: The aortic valve is normal in structure. Aortic valve regurgitation is not visualized. No aortic stenosis is present.  Pulmonic Valve: The pulmonic valve was normal in structure. Pulmonic valve regurgitation is not visualized. No evidence of pulmonic stenosis.  Aorta: The aortic root is normal in size and structure.  Venous: The inferior vena cava is dilated in size with less than 50% respiratory variability, suggesting right atrial pressure of 15 mmHg.  IAS/Shunts: No atrial level shunt detected by color flow Doppler.  Additional Comments:  A device lead is visualized.   LEFT VENTRICLE PLAX 2D LVIDd:         4.80 cm LVIDs:         3.70 cm LV PW:         1.60 cm LV IVS:        1.50 cm    IVC IVC diam: 2.40 cm  AORTIC VALVE LVOT Vmax:   99.10 cm/s LVOT Vmean:  55.800 cm/s LVOT VTI:    0.160 m  MITRAL VALVE                TRICUSPID VALVE MV Area (PHT): 4.29 cm     TR Peak grad:   23.6 mmHg MV Decel Time: 177 msec     TR Vmax:        243.00 cm/s MV E velocity: 129.00 cm/s MV A velocity: 77.40 cm/s   SHUNTS MV E/A ratio:  1.67         Systemic VTI: 0.16 m  Armanda Magic MD Electronically signed by Armanda Magic MD Signature Date/Time: 07/23/2023/1:19:28 PM      Final      Assessment/Plan:  Principal Problem:   COPD exacerbation (HCC) Active Problems:   Chronic combined systolic and diastolic heart failure (HCC)   Ischemic cardiomyopathy   Dyslipidemia   Essential hypertension   OSA (obstructive sleep apnea)   COPD Exacerbation, Respiratory Acidosis Pt with worsened respiratory distress while admitted inpatient on nasal cannula this morning. Pt was put on BIPAP late morning, which improved his status. VBGs and morning labs were collected at that time, and showed an improvement in his pH and hypercarbia from admission but continued mild acidosis. Patient was extremely anxious,  and will be started on a trial of hydroxyzine to see whether this might help improve his symptoms of distress which seem to be exacerbated by his panic. Respiratory viral panels from 11/11 were all negative. Working diagnosis is COPD exacerbation, but the proximate cause is unknown. - Changed to inpatient status, IMTS, Attending Dr Sol Blazing - Moved to 6E where he could be on BIPAP as needed. - Repeat CXR was largely unchanged, showing atelectasis, especially right basilar segmental atelectasis. The round, fluid-filled mass on RLL seems unchanged from previous CT on 10/2. - Pt to receive 3 more doses of PO prednisone for a 5 day steroid course - Continue ipratroprium-albuterol  0.5-2.5 mg/ml -3 ml nebs Q4 - budesonide 0.5 mg nebulizer BID  - If respiratory stress worsens, he will go back on BIPAP - CPAP overnight.  Ischemic cardiomyopathy, Combined systolic/diastolic HF Echocardiogram today showed largely stable heart failure.  - Requested biventricular pacemaker interrogation from Cardiology, appreciate their assistance liaising with the product vendor to get our team the last 48 hrs of data. - Continue Torsemide 20 mg daily - Continue Aspirin 81 mg daily,  - Continue clopidogrel 75 mg daily  Hyperlipidemia: - Continue atorvastatin 80 mg daily   Essential hypertension -  -metoprolol succinate 75 BID   - Increase hydralazine to 75 mg TID (from 50 mg TID)  OSA (obstructive sleep apnea) -  - patient intermittently on bipap.  - Consider home health referral to help pt use his existing CPAP therapy consistently at home.   GERD:  - Continue home famotidine 40 mg daily - Start pantoprazole 40 mg daily  Chronic Pain:  - Continue gabapentin 200 at bedtime,  - Continue acetaminophen Q6prn   FEN: Heart healthy diet. VTE: xarelto 10 Code status: Full. Discussed with patient. Dispo:  Prior to Admission Living Arrangement: Home with grandson Anticipated Discharge Location: Barriers to  Discharge: medical stability Dispo: Anticipated discharge in approximately 1-2 day(s).   Margaretmary Dys, MD 07/23/2023, 2:31 PM Pager: @MYPAGER @ After 5pm on weekdays and 1pm on weekends: On Call pager [phone]

## 2023-07-24 ENCOUNTER — Other Ambulatory Visit (HOSPITAL_COMMUNITY): Payer: Self-pay

## 2023-07-24 DIAGNOSIS — N179 Acute kidney failure, unspecified: Secondary | ICD-10-CM

## 2023-07-24 LAB — BASIC METABOLIC PANEL
Anion gap: 9 (ref 5–15)
BUN: 28 mg/dL — ABNORMAL HIGH (ref 8–23)
CO2: 29 mmol/L (ref 22–32)
Calcium: 9.5 mg/dL (ref 8.9–10.3)
Chloride: 101 mmol/L (ref 98–111)
Creatinine, Ser: 1.93 mg/dL — ABNORMAL HIGH (ref 0.61–1.24)
GFR, Estimated: 35 mL/min — ABNORMAL LOW (ref >60)
Glucose, Bld: 92 mg/dL (ref 70–99)
Potassium: 3.6 mmol/L (ref 3.5–5.1)
Sodium: 139 mmol/L (ref 135–145)

## 2023-07-24 LAB — CBC
HCT: 33.1 % — ABNORMAL LOW (ref 39.0–52.0)
Hemoglobin: 10.5 g/dL — ABNORMAL LOW (ref 13.0–17.0)
MCH: 27.2 pg (ref 26.0–34.0)
MCHC: 31.7 g/dL (ref 30.0–36.0)
MCV: 85.8 fL (ref 80.0–100.0)
Platelets: 151 10*3/uL (ref 150–400)
RBC: 3.86 MIL/uL — ABNORMAL LOW (ref 4.22–5.81)
RDW: 15.6 % — ABNORMAL HIGH (ref 11.5–15.5)
WBC: 8 10*3/uL (ref 4.0–10.5)
nRBC: 0 % (ref 0.0–0.2)

## 2023-07-24 MED ORDER — FLUTICASONE FUROATE-VILANTEROL 200-25 MCG/ACT IN AEPB
1.0000 | INHALATION_SPRAY | Freq: Every day | RESPIRATORY_TRACT | Status: DC
  Administered 2023-07-26: 1 via RESPIRATORY_TRACT
  Filled 2023-07-24: qty 28

## 2023-07-24 MED ORDER — IPRATROPIUM-ALBUTEROL 0.5-2.5 (3) MG/3ML IN SOLN: 3.0000 mL | Freq: Four times a day (QID) | RESPIRATORY_TRACT | Status: DC | PRN

## 2023-07-24 MED ORDER — FLUTICASONE FUROATE-VILANTEROL 200-25 MCG/ACT IN AEPB: 1.0000 | INHALATION_SPRAY | Freq: Every day | RESPIRATORY_TRACT | Status: DC

## 2023-07-24 MED ORDER — IPRATROPIUM-ALBUTEROL 0.5-2.5 (3) MG/3ML IN SOLN
3.0000 mL | Freq: Four times a day (QID) | RESPIRATORY_TRACT | Status: DC
  Administered 2023-07-24: 3 mL via RESPIRATORY_TRACT
  Filled 2023-07-24: qty 3

## 2023-07-24 MED ORDER — UMECLIDINIUM BROMIDE 62.5 MCG/ACT IN AEPB: 1.0000 | INHALATION_SPRAY | Freq: Every day | RESPIRATORY_TRACT | Status: DC

## 2023-07-24 MED ORDER — TRELEGY ELLIPTA 200-62.5-25 MCG/ACT IN AEPB: 1.0000 | INHALATION_SPRAY | Freq: Every day | RESPIRATORY_TRACT | 11 refills | Status: DC

## 2023-07-24 MED ORDER — UMECLIDINIUM BROMIDE 62.5 MCG/ACT IN AEPB
1.0000 | INHALATION_SPRAY | Freq: Every day | RESPIRATORY_TRACT | Status: DC
  Administered 2023-07-26: 1 via RESPIRATORY_TRACT
  Filled 2023-07-24 (×3): qty 7

## 2023-07-24 NOTE — Progress Notes (Signed)
   07/24/23 0032  BiPAP/CPAP/SIPAP  $ Non-Invasive Ventilator  Non-Invasive Vent Subsequent  BiPAP/CPAP/SIPAP Pt Type Adult  BiPAP/CPAP/SIPAP V60  Reason BIPAP/CPAP not in use Non-compliant (pt refusing to wear)  Mask Type Full face mask  Mask Size Medium

## 2023-07-24 NOTE — Progress Notes (Addendum)
HD#1 Subjective:   Summary: This is a 82 year old male with a past medical history of COPD, combined heart failure, dyslipidemia, hypertension, OSA who presents with concerns of shortness of breath.  Patient admitted for further evaluation and management of COPD exacerbation.  Overnight Events: No overnight events  Patient sitting up and eating breakfast upon my exam.  He denies any shortness of breath.  He reports he has been up and walking with no concerns.  He denies any chest pain.  He states he is eating and drinking well.  He has no concerns this morning.  He states that he does not take his Trelegy at home and does not have this prescription. He has been taking Bevespi and using albuterol prn.  Objective:  Vital signs in last 24 hours: Vitals:   07/23/23 1959 07/23/23 2100 07/23/23 2329 07/24/23 0331  BP: 126/65  (!) 131/54 133/70  Pulse: 87 67 (!) 59 83  Resp: 17 15 14 14   Temp: 97.8 F (36.6 C)  98.1 F (36.7 C) 98.2 F (36.8 C)  TempSrc: Oral  Oral Oral  SpO2: 100% 100% 100% 100%  Weight:      Height:       Supplemental O2: Nasal Cannula SpO2: 100 % O2 Flow Rate (L/min): 3 L/min FiO2 (%): 35 %   Physical Exam:  Constitutional: Resting bed, no acute distress, nasal cannula in place Cardiovascular: regular rate and rhythm, no m/r/g Pulmonary/Chest: Decreased bibasilar lung sounds, no rales, rhonchi, or wheezing appreciated MSK: normal bulk and tone Extremities: No edema appreciated to bilateral lower extremities Filed Weights   07/22/23 0944  Weight: 65.3 kg     Intake/Output Summary (Last 24 hours) at 07/24/2023 0638 Last data filed at 07/24/2023 0330 Gross per 24 hour  Intake --  Output 3800 ml  Net -3800 ml   Net IO Since Admission: -3,900 mL [07/24/23 0638]  Pertinent Labs:    Latest Ref Rng & Units 07/24/2023    6:59 AM 07/23/2023   11:19 AM 07/22/2023    8:42 AM  CBC  WBC 4.0 - 10.5 K/uL 8.0  9.5    Hemoglobin 13.0 - 17.0 g/dL 40.9  81.1   91.4   Hematocrit 39.0 - 52.0 % 33.1  34.2  36.0   Platelets 150 - 400 K/uL 151  155         Latest Ref Rng & Units 07/24/2023    6:59 AM 07/23/2023    8:40 PM 07/23/2023   11:18 AM  CMP  Glucose 70 - 99 mg/dL 92  782  956   BUN 8 - 23 mg/dL 28  26  21    Creatinine 0.61 - 1.24 mg/dL 2.13  0.86  5.78   Sodium 135 - 145 mmol/L 139  140  140   Potassium 3.5 - 5.1 mmol/L 3.6  4.6  5.1   Chloride 98 - 111 mmol/L 101  104  107   CO2 22 - 32 mmol/L 29  25  18    Calcium 8.9 - 10.3 mg/dL 9.5  9.9  9.5     Imaging: DG CHEST PORT 1 VIEW  Result Date: 07/23/2023 CLINICAL DATA:  Shortness of breath. EXAM: PORTABLE CHEST 1 VIEW COMPARISON:  July 22, 2023.  June 12, 2023. FINDINGS: Stable cardiomediastinal silhouette. Left-sided fibrillator is unchanged. Stable rounded density seen laterally in right midlung consistent with loculated effusion in right minor fissure as noted on prior CT scan. Left lung is clear. Minimal right basilar subsegmental atelectasis  or scarring is noted. IMPRESSION: Minimal right basilar subsegmental atelectasis or scarring. Stable rounded density seen laterally in right midlung consistent with loculated effusion right minor fissure as noted on prior CT scan. Electronically Signed   By: Lupita Raider M.D.   On: 07/23/2023 13:55   ECHOCARDIOGRAM LIMITED  Result Date: 07/23/2023    ECHOCARDIOGRAM LIMITED REPORT   Patient Name:   [name] [facility] Date of Exam: 07/23/2023 Medical Rec #:  [id number]      Height:       68.0 in Accession #:    [id number]     Weight:       144.0 lb Date of Birth:  22-Sep-1940     BSA:          1.777 m Patient Age:    81 years       BP:           178/88 mmHg Patient Gender: M              HR:           66 bpm. Exam Location:  Inpatient Procedure: Limited Echo, Color Doppler and Cardiac Doppler Indications:    Acute rest distress R06.03  History:        Patient has prior history of Echocardiogram examinations, most                 recent  04/03/2023. Cardiomyopathy, CAD, COPD; Risk                 Factors:Dyslipidemia and Hypertension.  Sonographer:    Harriette Bouillon RDCS Referring Phys: [id number] GRACE LAU IMPRESSIONS  1. Left ventricular ejection fraction, by estimation, is 45 to 50%. The left ventricle has mildly decreased function. The left ventricle demonstrates global hypokinesis. There is severe concentric left ventricular hypertrophy.  2. Right ventricular systolic function is normal. The right ventricular size is normal. There is mildly elevated pulmonary artery systolic pressure. The estimated right ventricular systolic pressure is 38.6 mmHg.  3. The mitral valve is degenerative. Mild to moderate mitral valve regurgitation. No evidence of mitral stenosis.  4. The aortic valve is normal in structure. Aortic valve regurgitation is not visualized. No aortic stenosis is present.  5. The inferior vena cava is dilated in size with <50% respiratory variability, suggesting right atrial pressure of 15 mmHg. FINDINGS  Left Ventricle: Left ventricular ejection fraction, by estimation, is 45 to 50%. The left ventricle has mildly decreased function. The left ventricle demonstrates global hypokinesis. The left ventricular internal cavity size was normal in size. There is  severe concentric left ventricular hypertrophy. Right Ventricle: The right ventricular size is normal. No increase in right ventricular wall thickness. Right ventricular systolic function is normal. There is mildly elevated pulmonary artery systolic pressure. The tricuspid regurgitant velocity is 2.43  m/s, and with an assumed right atrial pressure of 15 mmHg, the estimated right ventricular systolic pressure is 38.6 mmHg. Left Atrium: Left atrial size was normal in size. Right Atrium: Right atrial size was normal in size. Pericardium: There is no evidence of pericardial effusion. Mitral Valve: The mitral valve is degenerative in appearance. There is mild thickening of the mitral valve  leaflet(s). Mild to moderate mitral valve regurgitation. No evidence of mitral valve stenosis. Tricuspid Valve: The tricuspid valve is normal in structure. Tricuspid valve regurgitation is mild . No evidence of tricuspid stenosis. Aortic Valve: The aortic valve is normal in structure. Aortic valve regurgitation is not visualized. No aortic stenosis  is present. Pulmonic Valve: The pulmonic valve was normal in structure. Pulmonic valve regurgitation is not visualized. No evidence of pulmonic stenosis. Aorta: The aortic root is normal in size and structure. Venous: The inferior vena cava is dilated in size with less than 50% respiratory variability, suggesting right atrial pressure of 15 mmHg. IAS/Shunts: No atrial level shunt detected by color flow Doppler. Additional Comments: A device lead is visualized.  LEFT VENTRICLE PLAX 2D LVIDd:         4.80 cm LVIDs:         3.70 cm LV PW:         1.60 cm LV IVS:        1.50 cm  IVC IVC diam: 2.40 cm AORTIC VALVE LVOT Vmax:   99.10 cm/s LVOT Vmean:  55.800 cm/s LVOT VTI:    0.160 m MITRAL VALVE                TRICUSPID VALVE MV Area (PHT): 4.29 cm     TR Peak grad:   23.6 mmHg MV Decel Time: 177 msec     TR Vmax:        243.00 cm/s MV E velocity: 129.00 cm/s MV A velocity: 77.40 cm/s   SHUNTS MV E/A ratio:  1.67         Systemic VTI: 0.16 m Armanda Magic MD Electronically signed by Armanda Magic MD Signature Date/Time: 07/23/2023/1:19:28 PM    Final     Assessment/Plan:   Principal Problem:   COPD exacerbation (HCC) Active Problems:   Dyslipidemia   Essential hypertension   Chronic combined systolic and diastolic heart failure (HCC)   Ischemic cardiomyopathy   OSA (obstructive sleep apnea)   Acute respiratory failure with hypercapnia (HCC)   Patient Summary: [name] is a 82 y.o. male with a past medical history of COPD, combined heart failure, dyslipidemia, hypertension, OSA who presents with concerns of shortness of breath.  Patient admitted for further  evaluation and management of COPD exacerbation.  #COPD exacerbation #Acute hypoxemic and hypercarbic respiratory failure Patient evaluated at bedside this morning.  He is improved.  He denies any shortness of breath or chest pain.  He has no concerns this morning.  On my exam, patient does not have any more wheezing.  Patient has been weaned down to 3 L nasal cannula.  There has been some concern about which inhalers patient is to take.  Will have pharmacy team discuss with patient explaining which inhalers to take. -Start back Breo and Incruse, which patient can be discharged with. Ultimately needs triple therapy outpatient. -Today is day 3 of 5 of prednisone -Continue to monitor respiratory status -Keep supplemental oxygen to keep SpO2 between 88-92% -Plan to have patient assistance for Trelegy, and until that can happen, we will plan to have Breo and Incruse to hold him over  #AKI Creatinine up to 1.93 from 1.72.  On admission creatinine was 1.15.  Do not think patient has any component of CKD.  Patient may have been over diuresed.  Did asked patient about obstruction symptoms which patient denied.  Likely patient is volume down. -Monitor BMP -Patient does not seem volume overloaded, will not further diurese -Monitor urine output  Chronic conditions: #Combined heart failure Patient euvolemic on exam today, likely also was over diuresed. -Hold home torsemide today -Continue aspirin 81 mg daily -Continue clopidogrel 75 mg daily  #Hyperlipidemia Continue atorvastatin 80 mg daily  #Hypertension Blood pressure currently measuring at 123/70 and 133/70.  Will continue home metoprolol 75 mg twice daily -Continue metoprolol succinate 75 mg twice daily -Continue hydralazine 75 mg 3 times daily  #OSA -Continue CPAP  #GERD -Continue famotidine 40 mg daily -Continue pantoprazole 40 mg daily  #Chronic pain -Continue gabapentin 200 mg nightly -Continue Tylenol as needed  Diet:  Normal IVF: None,None VTE: DOAC Code: Full  Dispo: Anticipated discharge to Home in 1 days pending clinical improvement.   Modena Slater DO Internal Medicine Resident PGY-2 [phone] Please contact the on call pager after 5 pm and on weekends at [phone].

## 2023-07-24 NOTE — Plan of Care (Signed)

## 2023-07-25 ENCOUNTER — Encounter (HOSPITAL_COMMUNITY): Payer: Self-pay | Admitting: Internal Medicine

## 2023-07-25 ENCOUNTER — Inpatient Hospital Stay (HOSPITAL_COMMUNITY): Payer: Medicare Other

## 2023-07-25 DIAGNOSIS — J9601 Acute respiratory failure with hypoxia: Secondary | ICD-10-CM

## 2023-07-25 LAB — URINALYSIS, ROUTINE W REFLEX MICROSCOPIC
Bilirubin Urine: NEGATIVE
Glucose, UA: NEGATIVE mg/dL
Hgb urine dipstick: NEGATIVE
Ketones, ur: NEGATIVE mg/dL
Leukocytes,Ua: NEGATIVE
Nitrite: NEGATIVE
Protein, ur: NEGATIVE mg/dL
Specific Gravity, Urine: 1.012 (ref 1.005–1.030)
pH: 5 (ref 5.0–8.0)

## 2023-07-25 LAB — BASIC METABOLIC PANEL
Anion gap: 10 (ref 5–15)
BUN: 44 mg/dL — ABNORMAL HIGH (ref 8–23)
CO2: 26 mmol/L (ref 22–32)
Calcium: 9 mg/dL (ref 8.9–10.3)
Chloride: 100 mmol/L (ref 98–111)
Creatinine, Ser: 2.14 mg/dL — ABNORMAL HIGH (ref 0.61–1.24)
GFR, Estimated: 31 mL/min — ABNORMAL LOW (ref >60)
Glucose, Bld: 97 mg/dL (ref 70–99)
Potassium: 3.5 mmol/L (ref 3.5–5.1)
Sodium: 136 mmol/L (ref 135–145)

## 2023-07-25 MED ORDER — FAMOTIDINE 20 MG PO TABS
20.0000 mg | ORAL_TABLET | Freq: Every evening | ORAL | Status: DC
  Administered 2023-07-25: 20 mg via ORAL
  Filled 2023-07-25: qty 1

## 2023-07-25 NOTE — Progress Notes (Addendum)
HD#2 Subjective:   Summary: This is a 82 year old male with a past medical history of COPD, combined heart failure, dyslipidemia, hypertension, OSA who presents with concerns of shortness of breath.  Patient admitted for further evaluation and management of COPD exacerbation.  Overnight Events: No overnight events  Patient ambulating with PT shortly before the team visited. Pt sitting upright in bed in no acute distress when team arrived. Saw pt with IMTS rounding team, attending physician Dr Sol Blazing. Pt was alert, oriented, and seemed improved relative to his previous days. He had no acute shortness of breath overnight and was able to carry on normal conversation without signs of discomfort, an improvement relative to previous days.  Patient did not mention any new symptoms overnight. Team discussed with him the new acute kidney injury that showed up on his laboratory values this morning. Discussed the need to drink more water in order to make sure he is giving his kidneys the fluid they need to work properly. Pt voiced understanding.  Re-assessed patient in early afternoon to assess his progress drinking fluids. Pt had already met 50% of his water goals at the time of reassessment. Patient did not have a clear understanding of the natural course of his chronic health conditions.   Objective:  Vital signs in last 24 hours: Vitals:   07/24/23 2111 07/25/23 0444 07/25/23 0743 07/25/23 1143  BP: 134/64 (!) 141/76 130/77   Pulse: 62 77 66   Resp: 17 18 17    Temp: 97.9 F (36.6 C) 98.2 F (36.8 C) 97.7 F (36.5 C)   TempSrc: Oral Oral Oral Oral  SpO2: 96% 93% 99%   Weight:      Height:       Supplemental O2: Nasal Cannula SpO2: 99 % O2 Flow Rate (L/min): 3 L/min FiO2 (%): 35 %   Physical Exam:  Constitutional: Resting bed, no acute distress, breathing comfortably on room air. Cardiovascular: regular rate and rhythm, no m/r/g Pulmonary/Chest: Decreased bibasilar lung sounds, no  rales, rhonchi. No wheezes. MSK: normal bulk and tone Extremities: No edema appreciated to bilateral lower extremities Filed Weights   07/22/23 0944  Weight: 65.3 kg     Intake/Output Summary (Last 24 hours) at 07/25/2023 1300 Last data filed at 07/25/2023 0800 Gross per 24 hour  Intake 720 ml  Output 1200 ml  Net -480 ml   Net IO Since Admission: -3,900 mL [07/25/23 1300]  Pertinent Labs:    Latest Ref Rng & Units 07/24/2023    6:59 AM 07/23/2023   11:19 AM 07/22/2023    8:42 AM  CBC  WBC 4.0 - 10.5 K/uL 8.0  9.5    Hemoglobin 13.0 - 17.0 g/dL 78.2  95.6  21.3   Hematocrit 39.0 - 52.0 % 33.1  34.2  36.0   Platelets 150 - 400 K/uL 151  155         Latest Ref Rng & Units 07/25/2023    5:53 AM 07/24/2023    6:59 AM 07/23/2023    8:40 PM  CMP  Glucose 70 - 99 mg/dL 97  92  086   BUN 8 - 23 mg/dL 44  28  26   Creatinine 0.61 - 1.24 mg/dL 5.78  4.69  6.29   Sodium 135 - 145 mmol/L 136  139  140   Potassium 3.5 - 5.1 mmol/L 3.5  3.6  4.6   Chloride 98 - 111 mmol/L 100  101  104   CO2 22 - 32 mmol/L  26  29  25    Calcium 8.9 - 10.3 mg/dL 9.0  9.5  9.9     Imaging: No results found.  Assessment/Plan:   Principal Problem:   COPD exacerbation (HCC) Active Problems:   Chronic combined systolic and diastolic heart failure (HCC)   Ischemic cardiomyopathy   Dyslipidemia   Essential hypertension   OSA (obstructive sleep apnea)   Acute respiratory failure with hypercapnia (HCC)   AKI (acute kidney injury) (HCC)   Patient Summary: [name] is a 82 y.o. male with a past medical history of COPD, combined heart failure, dyslipidemia, hypertension, OSA who presents with concerns of shortness of breath.  Patient admitted for further evaluation and management of COPD exacerbation.  #COPD exacerbation #Acute hypoxemic and hypercarbic respiratory failure Patient evaluated at bedside this morning.  He is improved.  He denies any shortness of breath or chest pain.  He  has no concerns this morning.  Pt breathing comfortably on room air. Pt is to follow with [facility] outpatient - Start back [place] and [place], which patient can be discharged with. Ultimately needs triple therapy outpatient. Trelegy patient assistance started. - Today is day 4 of 5 of prednisone - Continue to monitor respiratory status - Plan to have patient assistance for Trelegy, and until that can happen, we will plan to have Breo and Incruse to hold him over  #AKI Creatinine up to 2.14 from 1.72.  On admission creatinine was 1.15. Pt history is somewhat unclear as to whether he has a clear history of chronic kidney disease or not. Patient may have been over diuresed. Did asked patient about obstruction symptoms which patient denied. Likely patient is volume down. UA bland. - Monitor BMP - Patient does not seem volume overloaded, will not further diurese - Monitor urine output  Chronic conditions: #Combined heart failure Patient euvolemic on exam today, likely also was over diuresed. -Hold home torsemide today -Continue aspirin 81 mg daily -Continue clopidogrel 75 mg daily  #Hyperlipidemia Continue atorvastatin 80 mg daily  #Hypertension Blood pressure currently measuring at 123/70 and 133/70.  Will continue home metoprolol 75 mg twice daily - Continue metoprolol succinate 75 mg twice daily - Continue hydralazine 75 mg 3 times daily  #OSA - Continue CPAP  #GERD - Decrease famotidine 20 mg daily - Continue pantoprazole 40 mg daily  #Chronic pain -Continue gabapentin 200 mg nightly -Continue Tylenol as needed  Diet: Normal IVF: None,None VTE: DOAC Code: Full  Dispo: Anticipated discharge to Home in 1 days pending clinical improvement.   Signed: Christie Nottingham, MD [facility], PGY-1 07/25/2023 1:29 PM

## 2023-07-26 ENCOUNTER — Other Ambulatory Visit: Payer: Self-pay

## 2023-07-26 ENCOUNTER — Other Ambulatory Visit (HOSPITAL_COMMUNITY): Payer: Self-pay

## 2023-07-26 DIAGNOSIS — K219 Gastro-esophageal reflux disease without esophagitis: Secondary | ICD-10-CM

## 2023-07-26 LAB — BASIC METABOLIC PANEL
Anion gap: 8 (ref 5–15)
BUN: 39 mg/dL — ABNORMAL HIGH (ref 8–23)
CO2: 27 mmol/L (ref 22–32)
Calcium: 8.9 mg/dL (ref 8.9–10.3)
Chloride: 100 mmol/L (ref 98–111)
Creatinine, Ser: 1.58 mg/dL — ABNORMAL HIGH (ref 0.61–1.24)
GFR, Estimated: 44 mL/min — ABNORMAL LOW (ref >60)
Glucose, Bld: 97 mg/dL (ref 70–99)
Potassium: 3.8 mmol/L (ref 3.5–5.1)
Sodium: 135 mmol/L (ref 135–145)

## 2023-07-26 MED ORDER — TRELEGY ELLIPTA 200-62.5-25 MCG/ACT IN AEPB: 1.0000 | INHALATION_SPRAY | Freq: Every day | RESPIRATORY_TRACT | Status: AC

## 2023-07-26 MED ORDER — IPRATROPIUM-ALBUTEROL 0.5-2.5 (3) MG/3ML IN SOLN
3.0000 mL | Freq: Four times a day (QID) | RESPIRATORY_TRACT | 1 refills | Status: AC | PRN
  Filled 2023-07-26: qty 180, 15d supply, fill #0

## 2023-07-26 MED ORDER — UMECLIDINIUM BROMIDE 62.5 MCG/ACT IN AEPB
1.0000 | INHALATION_SPRAY | Freq: Every day | RESPIRATORY_TRACT | 0 refills | Status: DC
  Filled 2023-07-26: qty 30, 30d supply, fill #0

## 2023-07-26 MED ORDER — ASPIRIN 81 MG PO CHEW
81.0000 mg | CHEWABLE_TABLET | Freq: Every day | ORAL | 0 refills | Status: DC
  Filled 2023-07-26: qty 30, 30d supply, fill #0

## 2023-07-26 MED ORDER — ALBUTEROL SULFATE HFA 108 (90 BASE) MCG/ACT IN AERS
2.0000 | INHALATION_SPRAY | Freq: Four times a day (QID) | RESPIRATORY_TRACT | 3 refills | Status: AC | PRN
  Filled 2023-07-26: qty 6.7, 25d supply, fill #0

## 2023-07-26 MED ORDER — GABAPENTIN 100 MG PO CAPS
200.0000 mg | ORAL_CAPSULE | Freq: Every evening | ORAL | 1 refills | Status: DC | PRN
  Filled 2023-07-26: qty 60, 30d supply, fill #0

## 2023-07-26 MED ORDER — ATORVASTATIN CALCIUM 80 MG PO TABS
80.0000 mg | ORAL_TABLET | Freq: Every day | ORAL | 0 refills | Status: DC
  Filled 2023-07-26: qty 30, 30d supply, fill #0

## 2023-07-26 MED ORDER — METOPROLOL SUCCINATE ER 25 MG PO TB24
75.0000 mg | ORAL_TABLET | Freq: Two times a day (BID) | ORAL | 0 refills | Status: DC
  Filled 2023-07-26: qty 180, 30d supply, fill #0

## 2023-07-26 MED ORDER — FAMOTIDINE 20 MG PO TABS
20.0000 mg | ORAL_TABLET | Freq: Every evening | ORAL | 0 refills | Status: DC
  Filled 2023-07-26: qty 30, 30d supply, fill #0

## 2023-07-26 MED ORDER — FAMOTIDINE 40 MG PO TABS
20.0000 mg | ORAL_TABLET | Freq: Every evening | ORAL | 0 refills | Status: DC
  Filled 2023-07-26: qty 15, 30d supply, fill #0

## 2023-07-26 MED ORDER — ASPIRIN 81 MG PO TBEC
81.0000 mg | DELAYED_RELEASE_TABLET | Freq: Every day | ORAL | 0 refills | Status: DC
  Filled 2023-07-26: qty 30, 30d supply, fill #0

## 2023-07-26 MED ORDER — FLUTICASONE FUROATE-VILANTEROL 200-25 MCG/ACT IN AEPB
1.0000 | INHALATION_SPRAY | Freq: Every day | RESPIRATORY_TRACT | 1 refills | Status: DC
  Filled 2023-07-26: qty 60, 30d supply, fill #0

## 2023-07-26 MED ORDER — PANTOPRAZOLE SODIUM 40 MG PO TBEC
40.0000 mg | DELAYED_RELEASE_TABLET | Freq: Two times a day (BID) | ORAL | 0 refills | Status: DC
  Filled 2023-07-26: qty 60, 30d supply, fill #0

## 2023-07-26 MED ORDER — CLOPIDOGREL BISULFATE 75 MG PO TABS
75.0000 mg | ORAL_TABLET | Freq: Every day | ORAL | 0 refills | Status: DC
  Filled 2023-07-26: qty 30, 30d supply, fill #0

## 2023-07-26 MED ORDER — TORSEMIDE 20 MG PO TABS
20.0000 mg | ORAL_TABLET | Freq: Every day | ORAL | 0 refills | Status: AC | PRN
  Filled 2023-07-26: qty 30, 30d supply, fill #0

## 2023-07-26 MED ORDER — HYDRALAZINE HCL 50 MG PO TABS
75.0000 mg | ORAL_TABLET | Freq: Three times a day (TID) | ORAL | 0 refills | Status: DC
  Filled 2023-07-26: qty 135, 30d supply, fill #0

## 2023-07-26 MED ORDER — FAMOTIDINE 20 MG PO TABS: 40.0000 mg | ORAL_TABLET | Freq: Every evening | ORAL | Status: DC

## 2023-07-26 NOTE — Plan of Care (Signed)

## 2023-07-26 NOTE — TOC Transition Note (Signed)
Transition of Care [facility]) - CM/SW Discharge Note   Patient Details  Name: [name] MRN: [id number] Date of Birth: [birth year]  [facility]) CM/SW Contact:  Leone Haven, RN Phone Number: 07/26/2023, 11:56 AM   Clinical Narrative:    For possible dc today, has no needs. Has transport home.   Final next level of care: Home/Self Care Barriers to Discharge: No Barriers Identified   Patient Goals and CMS Choice   Choice offered to / list presented to : NA  Discharge Placement                         Discharge Plan and Services Additional resources added to the After Visit Summary for   In-house Referral: NA Discharge Planning Services: CM Consult Post Acute Care Choice: NA          DME Arranged: N/A DME Agency: NA       HH Arranged: NA          Social Determinants of Health (SDOH) Interventions SDOH Screenings   Food Insecurity: No Food Insecurity (07/22/2023)  Housing: Low Risk  (07/22/2023)  Transportation Needs: No Transportation Needs (07/22/2023)  Utilities: Not At Risk (07/22/2023)  Alcohol Screen: Low Risk  (04/19/2023)  Depression (PHQ2-9): Low Risk  (06/07/2023)  Financial Resource Strain: High Risk (07/08/2023)  Tobacco Use: High Risk (07/22/2023)     Readmission Risk Interventions    07/26/2023   11:53 AM  Readmission Risk Prevention Plan  Transportation Screening Complete  PCP or Specialist Appt within 3-5 Days Complete  HRI or Home Care Consult Complete  Palliative Care Screening Not Applicable  Medication Review (RN Care Manager) Complete

## 2023-07-26 NOTE — Progress Notes (Signed)
Mobility Specialist Progress Note:   07/26/23 1100  Mobility  Activity Ambulated independently in hallway  Level of Assistance Modified independent, requires aide device or extra time  Assistive Device None  Distance Ambulated (ft) 400 ft  Activity Response Tolerated well  Mobility Referral Yes  $Mobility charge 1 Mobility  Mobility Specialist Start Time (ACUTE ONLY) 1044  Mobility Specialist Stop Time (ACUTE ONLY) 1055  Mobility Specialist Time Calculation (min) (ACUTE ONLY) 11 min    Pre Mobility: 87 HR During Mobility: 107 HR Post Mobility:  91 HR  Received pt in bed having no complaints and agreeable to mobility. Pt was asymptomatic throughout ambulation and returned to room w/o fault. Left in bed w/ call bell in reach and all needs met.   D'Vante Earlene Plater Mobility Specialist Please contact via Special educational needs teacher or Rehab office at [phone]

## 2023-07-26 NOTE — TOC Initial Note (Signed)
Transition of Care [facility]) - Initial/Assessment Note    Patient Details  Name: [name] MRN: [id number] Date of Birth: [birth year]  Transition of Care [facility]) CM/SW Contact:    Leone Haven, RN Phone Number: 07/26/2023, 11:55 AM  Clinical Narrative:                 From home with grandson, has PCP and insurance on file, states has no HH services in place at this time , has a walker at home but does not use it. States family member will transport them home at [employer] and family is support system, states gets medications from [facility] thru mail and [facility].  Pta self ambulatory .  Expected Discharge Plan: Home/Self Care Barriers to Discharge: No Barriers Identified   Patient Goals and CMS Choice Patient states their goals for this hospitalization and ongoing recovery are:: return home   Choice offered to / list presented to : NA      Expected Discharge Plan and Services In-house Referral: NA Discharge Planning Services: CM Consult Post Acute Care Choice: NA Living arrangements for the past 2 months: Single Family Home Expected Discharge Date: 07/26/23               DME Arranged: N/A DME Agency: NA       HH Arranged: NA          Prior Living Arrangements/Services Living arrangements for the past 2 months: Single Family Home Lives with:: Adult Children (grandson) Patient language and need for interpreter reviewed:: Yes Do you feel safe going back to the place where you live?: Yes      Need for Family Participation in Patient Care: Yes (Comment) Care giver support system in place?: Yes (comment) Current home services: DME (walker, do not use) Criminal Activity/Legal Involvement Pertinent to Current Situation/Hospitalization: No - Comment as needed  Activities of Daily Living   ADL Screening (condition at time of admission) Independently performs ADLs?: Yes (appropriate for developmental age) Is the patient deaf or have difficulty hearing?: No Does the  patient have difficulty seeing, even when wearing glasses/contacts?: Yes Does the patient have difficulty concentrating, remembering, or making decisions?: No  Permission Sought/Granted Permission sought to share information with : Case Manager Permission granted to share information with : Yes, Verbal Permission Granted              Emotional Assessment   Attitude/Demeanor/Rapport: Engaged Affect (typically observed): Appropriate Orientation: : Oriented to Self, Oriented to Place, Oriented to  Time, Oriented to Situation Alcohol / Substance Use: Not Applicable Psych Involvement: No (comment)  Admission diagnosis:  COPD exacerbation (HCC) [J44.1] Acute respiratory failure with hypercapnia (HCC) [J96.02] Patient Active Problem List   Diagnosis Date Noted   Gastroesophageal reflux disease 07/26/2023   AKI (acute kidney injury) (HCC) 07/24/2023   Acute respiratory failure with hypercapnia (HCC) 07/23/2023   COPD exacerbation (HCC) 07/22/2023   Left shoulder pain 06/12/2023   Acute respiratory distress 04/02/2023   Abdominal pain 12/15/2022   Non-ST elevation (NSTEMI) myocardial infarction (HCC) 12/15/2022   Paroxysmal A-fib (HCC) 12/15/2022   Status post coronary artery stent placement 12/13/2022   Myocardial infarction sequelae (HCC) 12/13/2022   Chest pain 12/11/2022   Unstable angina (HCC) 12/10/2022   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   Depression 09/15/2018   OSA (obstructive sleep apnea) 04/18/2018   Healthcare maintenance 04/18/2018   Dizziness 02/06/2017   Near syncope 02/06/2017   Allergic rhinitis 01/17/2017   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   Nocturnal hypoxemia 07/03/2016   Facial abscess 02/04/2016   Dental abscess    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/12/2014   Angina decubitus (HCC) 06/15/2014   Sinus bradycardia 05/31/2014   Syncope 05/30/2014   Chronic combined systolic and diastolic heart failure (HCC)    Mixed  hyperlipidemia    Pulmonary edema 05/10/2014   Essential hypertension 05/08/2014   LBBB (left bundle branch block) 05/08/2014   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   Low back pain 10/05/2013   NSVT (nonsustained ventricular tachycardia) (HCC) 09/04/2013   Flu-like symptoms 09/02/2013   Anemia 09/02/2013   COPD with asthma (HCC)    Nocturia 06/18/2013   Dyspnea 06/18/2013   LV dysfunction 12/13/2011   Tobacco abuse 10/10/2011   CAD (coronary artery disease) 10/10/2011   Dyslipidemia    PCP:  Rocky Morel, DO Pharmacy:   [facility] [address] [place] [address] Phone: [phone] Fax: [phone]  [name] [address] [place] [address] Phone: [phone] Fax: [phone]  [facility] [place], [place] - [address] [place] [address] Phone: [phone] Fax: [phone]     Social Determinants of Health (SDOH) Social History: SDOH Screenings   Food Insecurity: No Food Insecurity (07/22/2023)  Housing: Low Risk  (07/22/2023)  Transportation Needs: No Transportation Needs (07/22/2023)  Utilities: Not At Risk (07/22/2023)  Alcohol Screen: Low Risk  (04/19/2023)  Depression (PHQ2-9): Low Risk  (06/07/2023)  Financial Resource Strain: High Risk (07/08/2023)  Tobacco Use: High Risk (07/22/2023)   SDOH Interventions:     Readmission Risk Interventions    07/26/2023   11:53 AM  Readmission Risk Prevention Plan  Transportation Screening Complete  PCP or Specialist Appt within 3-5 Days Complete  HRI or Home Care Consult Complete  Palliative Care Screening Not Applicable  Medication Review (RN Care Manager) Complete

## 2023-07-26 NOTE — Care Management Important Message (Signed)
Important Message  Patient Details  Name: [name] MRN: [id number] Date of Birth: 10/04/1940   Important Message Given:  Yes - Medicare IM     Sherilyn Banker 07/26/2023, 4:40 PM

## 2023-07-26 NOTE — Discharge Summary (Addendum)
Name: [name] MRN: [id number] DOB: 01-08-1941 82 y.o. PCP: Rocky Morel, DO  Date of Admission: 07/22/2023  8:15 AM Date of Discharge: 07/26/23 Attending Physician: Dickie La, MD  Discharge Diagnosis: 1. Principal Problem:   COPD exacerbation (HCC) Active Problems:   Chronic combined systolic and diastolic heart failure (HCC)   Ischemic cardiomyopathy   Dyslipidemia   Essential hypertension   OSA (obstructive sleep apnea)   Acute respiratory failure with hypercapnia (HCC)   AKI (acute kidney injury) [facility])   Discharge Medications: Allergies as of 07/26/2023       Reactions   Acyclovir And Related Other (See Comments)   Pt states he is not familiar with this medication.   Aspirin Other (See Comments)   GI upset at high doses only.        Medication List     STOP taking these medications    ALKA-SELTZER ANTACID PO   feeding supplement Liqd   hydrocortisone cream 1 %   tetrahydrozoline 0.05 % ophthalmic solution       TAKE these medications    albuterol 108 (90 Base) MCG/ACT inhaler Commonly known as: VENTOLIN HFA Inhale 2 puffs into the lungs every 6 (six) hours as needed for wheezing or shortness of breath. What changed: when to take this   aspirin 81 MG chewable tablet Chew 1 tablet (81 mg total) by mouth daily.   atorvastatin 80 MG tablet Commonly known as: LIPITOR Take 1 tablet (80 mg total) by mouth once daily.   clopidogrel 75 MG tablet Commonly known as: PLAVIX Take 1 tablet (75 mg total) by mouth daily. Start taking on: July 27, 2023   famotidine 40 MG tablet Commonly known as: PEPCID Take 0.5 tablets (20 mg total) by mouth every evening. What changed: how much to take   fluticasone furoate-vilanterol 200-25 MCG/ACT Aepb Commonly known as: BREO ELLIPTA Inhale 1 puff into the lungs daily. Start taking on: July 27, 2023   gabapentin 100 MG capsule Commonly known as: NEURONTIN Take 2 capsules (200 mg total) by  mouth at bedtime as needed (for shoulder pain).   hydrALAZINE 50 MG tablet Commonly known as: APRESOLINE Take 1.5 tablets (75 mg total) by mouth 3 (three) times daily. What changed: how much to take   ipratropium-albuterol 0.5-2.5 (3) MG/3ML Soln Commonly known as: DUONEB Take 3 mLs by nebulization every 6 (six) hours as needed (wheezing or shortness of breath). What changed:  when to take this reasons to take this   metoprolol succinate 25 MG 24 hr tablet Commonly known as: TOPROL-XL Take 3 tablets (75 mg total) by mouth 2 (two) times daily.   pantoprazole 40 MG tablet Commonly known as: PROTONIX Take 1 tablet (40 mg total) by mouth 2 (two) times daily.   torsemide 20 MG tablet Commonly known as: DEMADEX Take 1 tablet (20 mg total) by mouth daily as needed (fluid). What changed:  when to take this reasons to take this   Trelegy Ellipta 200-62.5-25 MCG/ACT Aepb Generic drug: Fluticasone-Umeclidin-Vilant Inhale 1 Inhalation into the lungs daily.   umeclidinium bromide 62.5 MCG/ACT Aepb Commonly known as: INCRUSE ELLIPTA Inhale 1 puff into the lungs daily. Start taking on: July 27, 2023        Disposition and follow-up:   [name] was discharged from [facility] in Stable condition. At the hospital follow up visit please address:  1.  Follow-up on patient needs for inhalers and financial assistance for medications.  2.  Labs /  imaging needed at time of follow-up: BMP  3.  Pending labs/ test needing follow-up: None  Follow-up Appointments:  Follow-up Information     Morene Crocker, MD. Go on 08/15/2023.   Specialty: Internal Medicine Why: Please go to your appointment on 08/15/2023 at 1:45pm in the [facility]. Contact information: [address] [place] [address] [phone]                 Hospital Course by problem list: 1. COPD exacerbation; Acute hypoxemic and hypercarbic respiratory failure Patient evaluated  at bedside this morning. Pt reports complete resolution of his original symptoms. He denies any shortness of breath or chest pain. He has no concerns this morning. Pt breathing comfortably on room air. Unfortunately, patient still did not have the correct inhalers at home and continued to use Bevespi alone when triple therapy has been recommended. - Discharge with Virgel Bouquet and Incruse, use reviewed with patient. - Patient assistance paperwork for Trelegy has been sent to the manufacturer per pharmacy and routed to [facility], Siri Cole, for follow-up. Continue Breo and Incruse until Trelegy has been approved.   2. AKI, CKD 3a Creatinine and BUN have improved with increased PO intake. Patient may have been over diuresed. Renal studies and urinary analysis were benign. - Encourage drinking lots of water so that kidneys get the fluid they need. - Return to as-needed torsemide diuresis   Chronic conditions: 3. Combined heart failure Patient euvolemic on exam today, likely also was over diuresed. -Hold home torsemide today, restart as needed at home -Continue aspirin 81 mg daily -Continue clopidogrel 75 mg daily   Hyperlipidemia Continue atorvastatin 80 mg daily   Hypertension - Continue metoprolol succinate 75 mg twice daily - Continue hydralazine 75 mg 3 times daily   GERD - Decrease famotidine 20 mg daily - Continue pantoprazole 40 mg daily for 11 weeks.   Chronic pain -Continue gabapentin 200 mg nightly -Continue Tylenol as needed  Discharge Exam:   BP (!) 153/69 (BP Location: Right Arm)   Pulse 67   Temp 98 F (36.7 C) (Oral)   Resp 17   Ht 5\' 8"  (1.727 m)   Wt 65.3 kg   SpO2 98%   BMI 21.90 kg/m  Discharge exam:  Constitutional: Resting bed, no acute distress, breathing comfortably on room air. Cardiovascular: regular rate and rhythm, no m/r/g Pulmonary/Chest: Decreased bibasilar lung sounds, no rales, rhonchi. No wheezes. Abdomen: Abdomen soft, non-tender to  palpation, non-distended. MSK: normal bulk and tone Extremities: No edema in bilateral lower extremities  Pertinent Labs, Studies, and Procedures:     Latest Ref Rng & Units 07/26/2023    5:24 AM 07/25/2023    5:53 AM 07/24/2023    6:59 AM  CMP  Glucose 70 - 99 mg/dL 97  97  92   BUN 8 - 23 mg/dL 39  44  28   Creatinine 0.61 - 1.24 mg/dL 8.41  3.24  4.01   Sodium 135 - 145 mmol/L 135  136  139   Potassium 3.5 - 5.1 mmol/L 3.8  3.5  3.6   Chloride 98 - 111 mmol/L 100  100  101   CO2 22 - 32 mmol/L 27  26  29    Calcium 8.9 - 10.3 mg/dL 8.9  9.0  9.5     Discharge Instructions: It was a pleasure taking care of you at [facility]. You were admitted for shortness of breath and treated for COPD exacerbation. While you were here in  the hospital, we treated an acute kidney injury. We are discharging you home now that you are doing better. Please follow the following instructions.   1) Wait to take that water pill until tomorrow. You can then start taking that tomorrow, please do not take that today 11/15. Take it as needed when you feel that you are gaining weight.  A) Weigh yourself and check your legs for swelling every day.  B) If you gain 3 lbs in one day OR more than 5 lbs in a week, definitely take your torsemide C) If you notice swelling in your legs, take your torsemide  2) Regarding your inhalers, please take breo and incruse daily until you run out. We are working on patient assistance for your Trelegy. Let us know when you are running out of the breo and incruse.   3) Here is your follow-up, the internal medicine center is on the ground floor of this hospital.   Follow-up Information     Morene Crocker, MD. Go on 08/15/2023.   Specialty: Internal Medicine Why: Please go to your appointment on 08/15/2023 at 1:45pm in the [facility]. Contact information: [address] [place] [address] [phone]                 Signed: Margaretmary Dys,  MD 07/26/2023, 11:02 AM   Pager: [phone]

## 2023-07-26 NOTE — Discharge Instructions (Addendum)
It was a pleasure taking care of you at [facility]. You were admitted for shortness of breath and treated for COPD exacerbation. While you were here in the hospital, we treated an acute kidney injury. We are discharging you home now that you are doing better. Please follow the following instructions.   1) Wait to take that water pill until tomorrow. You can then start taking that tomorrow, please do not take that today 11/15. Take it as needed when you feel that you are gaining weight.  A) Weigh yourself and check your legs for swelling every day.  B) If you gain 3 lbs in one day OR more than 5 lbs in a week, definitely take your torsemide C) If you notice swelling in your legs, take your torsemide  2) Regarding your inhalers, please take breo and incruse daily until you run out. We are working on patient assistance for your Trelegy. Let us know when you are running out of the breo and incruse.   3) Here is your follow-up, the internal medicine center is on the ground floor of this hospital.   Follow-up Information     Morene Crocker, MD. Go on 08/15/2023.   Specialty: Internal Medicine Why: Please go to your appointment on 08/15/2023 at 1:45pm in the [facility] Contact information: [address] [place] [address] [phone]           Signed: Margaretmary Dys, MD 07/26/2023, 11:02 AM   Pager: [phone]

## 2023-07-29 ENCOUNTER — Other Ambulatory Visit: Payer: Self-pay

## 2023-07-31 ENCOUNTER — Telehealth: Payer: Self-pay

## 2023-07-31 NOTE — Telephone Encounter (Signed)
Rec'd completed application from [facility] Alphia Moh, for Trelegy with [employer].   Application was also faxed by Isabelle Course.

## 2023-07-31 NOTE — Telephone Encounter (Signed)
Called [employer] to f/u on application status.   No application on file.  Faxing to company.

## 2023-08-07 NOTE — Progress Notes (Signed)
Pharmacy Medication Assistance Program Note    08/07/2023  Patient ID: [name], male   DOB: December 28, 1940, 82 y.o.   MRN: [id number]     07/31/2023  Outreach Medication One  Initial Outreach Date (Medication One) 07/24/2023  Manufacturer Medication One [employer] (GSK)  [employer] (GSK) Drugs Trelegy  Dose of Trelegy  Type of Assistance Manufacturer Assistance  Date Application Received From Provider 07/24/2023  Date Application Submitted to Manufacturer 07/31/2023   07/31/2023  Method Application Sent to Manufacturer Fax  Patient Assistance Determination Approved  Approval Start Date 08/06/2023  Approval End Date 08/04/2024     Multiple values from one day are sorted in reverse-chronological order     Medication will ship to patients home.

## 2023-08-09 ENCOUNTER — Ambulatory Visit (INDEPENDENT_AMBULATORY_CARE_PROVIDER_SITE_OTHER): Payer: Self-pay

## 2023-08-09 DIAGNOSIS — I472 Ventricular tachycardia, unspecified: Secondary | ICD-10-CM

## 2023-08-10 LAB — CUP PACEART REMOTE DEVICE CHECK
Battery Remaining Longevity: 43 mo
Battery Voltage: 2.96 V
Brady Statistic AP VP Percent: 43.22 %
Brady Statistic AP VS Percent: 0.19 %
Brady Statistic AS VP Percent: 54.7 %
Brady Statistic AS VS Percent: 1.89 %
Brady Statistic RA Percent Paced: 40.81 %
Brady Statistic RV Percent Paced: 94.72 %
Date Time Interrogation Session: 20241129042204
HighPow Impedance: 46 Ohm
Implantable Lead Connection Status: 753985
Implantable Lead Connection Status: 753985
Implantable Lead Connection Status: 753985
Implantable Lead Implant Date: 20151203
Implantable Lead Implant Date: 20151203
Implantable Lead Implant Date: 20151203
Implantable Lead Location: 753858
Implantable Lead Location: 753859
Implantable Lead Location: 753860
Implantable Lead Model: 4598
Implantable Lead Model: 5076
Implantable Lead Model: 6935
Implantable Pulse Generator Implant Date: 20221129
Lead Channel Impedance Value: 152 Ohm
Lead Channel Impedance Value: 152 Ohm
Lead Channel Impedance Value: 152 Ohm
Lead Channel Impedance Value: 152 Ohm
Lead Channel Impedance Value: 152 Ohm
Lead Channel Impedance Value: 247 Ohm
Lead Channel Impedance Value: 285 Ohm
Lead Channel Impedance Value: 304 Ohm
Lead Channel Impedance Value: 304 Ohm
Lead Channel Impedance Value: 304 Ohm
Lead Channel Impedance Value: 304 Ohm
Lead Channel Impedance Value: 361 Ohm
Lead Channel Impedance Value: 399 Ohm
Lead Channel Impedance Value: 532 Ohm
Lead Channel Impedance Value: 532 Ohm
Lead Channel Impedance Value: 532 Ohm
Lead Channel Impedance Value: 532 Ohm
Lead Channel Impedance Value: 551 Ohm
Lead Channel Pacing Threshold Amplitude: 0.625 V
Lead Channel Pacing Threshold Amplitude: 1 V
Lead Channel Pacing Threshold Amplitude: 1.25 V
Lead Channel Pacing Threshold Pulse Width: 0.4 ms
Lead Channel Pacing Threshold Pulse Width: 0.4 ms
Lead Channel Pacing Threshold Pulse Width: 0.8 ms
Lead Channel Sensing Intrinsic Amplitude: 1 mV
Lead Channel Sensing Intrinsic Amplitude: 1 mV
Lead Channel Sensing Intrinsic Amplitude: 3.375 mV
Lead Channel Sensing Intrinsic Amplitude: 3.375 mV
Lead Channel Setting Pacing Amplitude: 1.5 V
Lead Channel Setting Pacing Amplitude: 2.5 V
Lead Channel Setting Pacing Amplitude: 2.5 V
Lead Channel Setting Pacing Pulse Width: 0.4 ms
Lead Channel Setting Pacing Pulse Width: 0.8 ms
Lead Channel Setting Sensing Sensitivity: 0.3 mV
Zone Setting Status: 755011
Zone Setting Status: 755011

## 2023-08-15 ENCOUNTER — Other Ambulatory Visit: Payer: Self-pay

## 2023-08-15 ENCOUNTER — Ambulatory Visit (INDEPENDENT_AMBULATORY_CARE_PROVIDER_SITE_OTHER): Payer: Self-pay | Admitting: Student

## 2023-08-15 VITALS — BP 161/71 | HR 61 | Temp 97.6°F | Ht 68.0 in | Wt 142.9 lb

## 2023-08-15 DIAGNOSIS — I5042 Chronic combined systolic (congestive) and diastolic (congestive) heart failure: Secondary | ICD-10-CM

## 2023-08-15 DIAGNOSIS — F17210 Nicotine dependence, cigarettes, uncomplicated: Secondary | ICD-10-CM

## 2023-08-15 DIAGNOSIS — I2510 Atherosclerotic heart disease of native coronary artery without angina pectoris: Secondary | ICD-10-CM

## 2023-08-15 DIAGNOSIS — I110 Hypertensive heart disease with heart failure: Secondary | ICD-10-CM

## 2023-08-15 DIAGNOSIS — N179 Acute kidney failure, unspecified: Secondary | ICD-10-CM

## 2023-08-15 DIAGNOSIS — I10 Essential (primary) hypertension: Secondary | ICD-10-CM

## 2023-08-15 DIAGNOSIS — J4489 Other specified chronic obstructive pulmonary disease: Secondary | ICD-10-CM

## 2023-08-15 MED ORDER — CLOPIDOGREL BISULFATE 75 MG PO TABS
75.0000 mg | ORAL_TABLET | Freq: Every day | ORAL | 11 refills | Status: DC
  Filled 2023-08-15 – 2023-09-13 (×2): qty 30, 30d supply, fill #0

## 2023-08-15 MED ORDER — METOPROLOL SUCCINATE ER 25 MG PO TB24
25.0000 mg | ORAL_TABLET | Freq: Two times a day (BID) | ORAL | 11 refills | Status: DC
  Filled 2023-08-15: qty 90, 45d supply, fill #0

## 2023-08-15 MED ORDER — AMLODIPINE BESYLATE 10 MG PO TABS
10.0000 mg | ORAL_TABLET | Freq: Every day | ORAL | 11 refills | Status: DC
  Filled 2023-08-15: qty 30, 30d supply, fill #0
  Filled 2023-09-13: qty 30, 30d supply, fill #1

## 2023-08-15 MED ORDER — LOSARTAN POTASSIUM 50 MG PO TABS
50.0000 mg | ORAL_TABLET | Freq: Every day | ORAL | 11 refills | Status: DC
  Filled 2023-08-15: qty 30, 30d supply, fill #0
  Filled 2023-09-13: qty 30, 30d supply, fill #1

## 2023-08-15 MED ORDER — ATORVASTATIN CALCIUM 80 MG PO TABS
80.0000 mg | ORAL_TABLET | Freq: Every day | ORAL | 11 refills | Status: DC
  Filled 2023-08-15: qty 30, 30d supply, fill #0

## 2023-08-15 NOTE — Patient Instructions (Addendum)
Thank you, [name] for allowing [place] to provide your care today. Today we discussed:  High blood pressure STOP hydralazine  Continue Torsemide 10 mg daily Continue Metoprolol as you had been taking it -BLOOD PRESSURE  Please check your blood pressure daily following the instructions below AND bring your readings to the next outpatient visit  Blood pressure tips   It is best to check your BP 1-2 hours after taking your medications to see the medications effectiveness on your BP.    Here are some tips that our clinical pharmacists share for home BP monitoring:          Rest 10 minutes before taking your blood pressure.          Don't smoke or drink caffeinated beverages for at least 30 minutes before.          Take your blood pressure before (not after) you eat.          Sit comfortably with your back supported and both feet on the floor (don't cross your legs).          Elevate your arm to heart level on a table or a desk.          Use the proper sized cuff. It should fit smoothly and snugly around your bare upper arm. There should be enough room to slip a fingertip under the cuff. The bottom edge of the cuff should be 1 inch above the crease of the elbow.     Take ALL your medications before you return to clinic   Referrals: -Pulmonary rehabilitation  New medications: -I sent in a prescription for a medication called amlodipine: 10 mg daily.  -I also sent in a refill for the Losartan 50 mg daily  I have ordered the following labs for you:  Lab Orders         BMP8+Anion Gap       I will call if any are abnormal. All of your labs can be accessed through "My Chart".   My Chart Access: [email or web address]  Please follow-up in: 1 month for high blood pressure follow up    We look forward to seeing you next time. Please call our clinic at [phone] if you have any questions or concerns. The best time to call is  Monday-Friday from 9am-4pm, but there is someone available 24/7. If after hours or the weekend, call the main hospital number and ask for the Internal Medicine Resident On-Call. If you need medication refills, please notify your pharmacy one week in advance and they will send [place] a request.   Thank you for letting us take part in your care. Wishing you the best!  Morene Crocker, MD 08/15/2023, 2:15 PM [name]

## 2023-08-15 NOTE — Assessment & Plan Note (Signed)
Euvolemic today. Does not follow up with Cardiology until early 2025. No chest pain or anginal symptoms in the past month.  - Changes to medications as above and additon of Losartan today

## 2023-08-15 NOTE — Assessment & Plan Note (Signed)
S/p 12/2022 mid LAD stenting as well as stents overlapping in his mid to distal RCA.  He had unsuccessful ntervention of RCA complicated by "no reflow". No recent bleeding on DAPT. Continue on DAPT, which he should be on it indefinitely Continue on lipitor 80 mg daily

## 2023-08-15 NOTE — Progress Notes (Signed)
Subjective:  CC: Hospital follow up  HPI:  [name] is a 82 y.o. male with a past medical history stated below and presents today for follow up after recent hospitalization for COPD exacerbation, management of his hypertension, . Please see problem based assessment and plan for additional details.  Past Medical History:  Diagnosis Date   Acute renal failure (HCC) 05/31/2014   Angina decubitus (HCC) 06/15/2014   CAD (coronary artery disease)    a. s/p stent RCA DES 9/09; b. 2014 Attempted PCI of OM1 @ [place];  c. 04/2014 Cath: LAD 40-50p, D1 95-99 (chronic), LCX 30-40 inf branch, OM1 CTO, RCA 30-40p, RCA patent stent, EF 35%->Med Rx.   Chronic combined systolic and diastolic CHF (congestive heart failure) (HCC)    a. EF about 40 to 45% per echo in April 2013;  b. 04/2014 Echo: EF 20-25%, sev LVH, sev glob HK, Gr 1 DD, mildly reduced RV fxn, PASP . c. 01/2017: EF improved to 50-55%.    Chronic kidney disease (CKD), stage III (moderate) (HCC)    Chronic low back pain    Complete heart block (HCC)    COPD (chronic obstructive pulmonary disease) (HCC) dx 06/2013   PFTs 07/08/13: mod obst with resp to bronchodilator, moderate decrease diffusion, airtrapping   COPD with acute exacerbation (HCC) 01/17/2017   COPD with asthma (HCC)    07/08/13 PFT: FEV1 1.74L (66% pred, 30% change with BD), mod obst with resp to bronchodilator, moderate decrease diffusion, air-trapping 11/2013 Simple spiro>> clear obstruction, FEV1 1.30 L (47% pred) - trial of symbicort 160 2bid 01/26/15      Depression 09/15/2018   Assessment: Increased sadness and depression since losing job  Plan: Patient denies suicidal and homicidal ideations Patient would not like to start medication therapies at this time Will establish patient with [facility] for primary care   Dyslipidemia    a. on statin   Essential hypertension    HLD (hyperlipidemia)    HTN (hypertension)    a. Reports  intolerance to hydralazine; b. no beta blockers 2/2 bradycardia;  c. failed on ACE and ARB.   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 04/2014 Echo: EF 20-25%, sev glob HK.   Mixed Ischemic/Non-Ischemic Cardiomyopathy    probable mixed ischemic and non-ischemic    Noncompliance    OSA (obstructive sleep apnea) 04/18/2018   06/11/16 - home sleep study shows AHI of 2.9 an hour with the lowest SaO2 of 79% with an average of 93%  03/08/2018-Home sleep study-AHI 7/HR, SaO2 low 81%    Paroxysmal atrial fibrillation (HCC)    a. identified on device interrogation 01/2016   Pneumonia 09/02/2013   Pulmonary edema 05/10/2014   Respiratory arrest (HCC) 05/18/2014   Sinus bradycardia 05/31/2014   Ventricular tachycardia (HCC)    treated with ATP,  CL 250-300 msec    Current Outpatient Medications on File Prior to Visit  Medication Sig Dispense Refill   albuterol (VENTOLIN HFA) 108 (90 Base) MCG/ACT inhaler Inhale 2 puffs into the lungs every 6 (six) hours as needed for wheezing or shortness of breath. 6.7 g 3   aspirin EC 81 MG tablet Take 1 tablet (81 mg total) by mouth daily. Swallow whole. 120 tablet 0   famotidine (PEPCID) 20 MG tablet Take 1 tablet (20 mg total) by  mouth every evening. 30 tablet 0   fluticasone furoate-vilanterol (BREO ELLIPTA) 200-25 MCG/ACT AEPB Inhale 1 puff into the lungs daily. 60 each 1   Fluticasone-Umeclidin-Vilant (TRELEGY ELLIPTA) 200-62.5-25 MCG/ACT AEPB Inhale 1 Inhalation into the lungs daily.     gabapentin (NEURONTIN) 100 MG capsule Take 2 capsules (200 mg total) by mouth at bedtime as needed (for shoulder pain). 60 capsule 1   ipratropium-albuterol (DUONEB) 0.5-2.5 (3) MG/3ML SOLN Take 3 mLs by nebulization every 6 (six) hours as needed (wheezing or shortness of breath). 360 mL 1   pantoprazole (PROTONIX) 40 MG  tablet Take 1 tablet (40 mg total) by mouth 2 (two) times daily. 60 tablet 0   torsemide (DEMADEX) 20 MG tablet Take 1 tablet (20 mg total) by mouth daily as needed (fluid). 30 tablet 0   umeclidinium bromide (INCRUSE ELLIPTA) 62.5 MCG/ACT AEPB Inhale 1 puff into the lungs daily. 30 each 0   No current facility-administered medications on file prior to visit.    Family History  Problem Relation Age of Onset   Heart attack Neg Hx    Stroke Neg Hx     Social History   Socioeconomic History   Marital status: Widowed    Spouse name: Not on file   Number of children: 3   Years of education: Not on file   Highest education level: Not on file  Occupational History   Occupation: security guard.  Tobacco Use   Smoking status: Some Days    Current packs/day: 0.00    Average packs/day: 0.5 packs/day for 56.0 years (28.0 ttl pk-yrs)    Types: Cigarettes    Start date: 09/10/1956    Last attempt to quit: 09/10/2012    Years since quitting: 10.9   Smokeless tobacco: Never   Tobacco comments:    tried 1 month ago (05/2018) but didn't like it   Vaping Use   Vaping status: Never Used  Substance and Sexual Activity   Alcohol use: No   Drug use: No   Sexual activity: Not Currently  Other Topics Concern   Not on file  Social History Narrative          Social Determinants of Health   Financial Resource Strain: High Risk (07/08/2023)   Overall Financial Resource Strain (CARDIA)    Difficulty of Paying Living Expenses: Hard  Food Insecurity: No Food Insecurity (07/22/2023)   Hunger Vital Sign    Worried About Running Out of Food in the Last Year: Never true    Ran Out of Food in the Last Year: Never true  Transportation Needs: No Transportation Needs (07/22/2023)   PRAPARE - Administrator, Civil Service (Medical): No    Lack of Transportation (Non-Medical): No  Physical Activity: Not on file  Stress: Not on file  Social Connections: Not on file  Intimate Partner  Violence: Not At Risk (07/22/2023)   Humiliation, Afraid, Rape, and Kick questionnaire    Fear of Current or Ex-Partner: No    Emotionally Abused: No    Physically Abused: No    Sexually Abused: No    Review of Systems: ROS negative except for what is noted on the assessment and plan.  Objective:   Vitals:   08/15/23 1340 08/15/23 1405  BP: (!) 182/77 (!) 161/71  Pulse: 70 61  Temp: 97.6 F (36.4 C)   TempSrc: Oral   SpO2: 100%   Weight: 142 lb 14.4 oz (64.8 kg)   Height: 5\' 8"  (1.727 m)  Physical Exam: Constitutional: well-appearing man sitting laying in exam bed in no acute distress HENT: normocephalic atraumatic, mucous membranes moist Eyes: conjunctiva non-erythematous Neck: supple. No JVP noted Cardiovascular: regular rate and rhythm, no m/r/g Pulmonary/Chest: normal work of breathing on room air, lungs clear to auscultation bilaterally. No rales or wheezing on auscultation. Abdominal: soft, non-tender, non-distended MSK: normal bulk and tone, no lower extremity edema appreciated Neurological: alert & oriented x 3, 5/5 strength in bilateral upper and lower extremities, normal gait Skin: warm and dry Psych: Pleasant mood and affect       06/07/2023    8:19 AM  Depression screen PHQ 2/9  Decreased Interest 0  Down, Depressed, Hopeless 1  PHQ - 2 Score 1       01/14/2023    4:05 PM 11/05/2022    4:33 PM 10/15/2022    4:06 PM 07/11/2022    3:32 PM  GAD 7 : Generalized Anxiety Score  Nervous, Anxious, on Edge 0 1 1 1   Control/stop worrying 0 0 1 1  Worry too much - different things 0 0 2   Trouble relaxing 0 1 1 1   Restless 0 1 2 1   Easily annoyed or irritable 0 0 1 2  Afraid - awful might happen 0 1 0 1  Total GAD 7 Score 0 4 8      Assessment & Plan:   COPD with asthma (HCC) Patient with history of COPD with asthma who was recently admitted to the hospital for the second COPD exacerbation in the past 6 months. He remains on RA and transitioned to  Trelegy for triple therapy, which he received through manufacturer assistance program (4 inhalers total). Reports that has been adherent to it and has only required two uses of albuterol inhaler since his discharge. Denies cough, dyspnea, or sputum production. Denies wheezing. Continues to take about 6000 steps daily and has returned to work, though part time.  Stable cardiopulmonary exam today.  -Continue Trelegy inhaler daily -Continue albuterol PRN -Referral for pulmonary rehab in the setting of 2 COPD exacerbations in the past 6 months -Rerturn to work letter without restrictions today  AKI (acute kidney injury) (HCC) AKI on CKD3a. Baseline Cr. 1.1-3 GFR. Thought to be in the setting of overdiuresis while hospitalized. Cr. Was 1.5 at discharge. He has continued taking torsemide 10 mg as needed. Will recheck BMP today to assess for post discharge renal function.  Essential hypertension Uncontrolled today though asymptomatic. Vitals:   08/15/23 1340 08/15/23 1405  BP: (!) 182/77 (!) 161/71  He has continued on metoprolol succinate, 75 mg BID. He is also on Torsemide 10 mg daily. Has not been taking Hydralazine TID - reports only taking it when he notices his BP is elevated. Reviewing prior notes, it seems like he had had problems with adherence to this medication. Losartan was initially added during the summer and then in October but have fallen off his medication lists. Similarly with amlodipine. Suspect hospitalizations and medication reconciliation issues have contributed to this.    To simplify his regimen, will plan for the following - Continue metoprolol BID - Start amlodipine 10 mg daily - Continue Furosemide 10 mg PRN - BMP today - Re-start Losartan 50 mg daily - Return to clinic in 1 month for BMP recheck and BP trend  Chronic combined systolic and diastolic heart failure (HCC) Euvolemic today. Does not follow up with Cardiology until early 2025. No chest pain or anginal  symptoms in the past month.  -  Changes to medications as above and additon of Losartan today  CAD (coronary artery disease) S/p 12/2022 mid LAD stenting as well as stents overlapping in his mid to distal RCA.  He had unsuccessful ntervention of RCA complicated by "no reflow". No recent bleeding on DAPT. Continue on DAPT, which he should be on it indefinitely Continue on lipitor 80 mg daily    Return in about 4 weeks (around 09/12/2023) for HTN followu p.  Patient discussed with Dr. Rosalia Hammers, MD [facility]  08/15/2023, 4:47 PM

## 2023-08-15 NOTE — Assessment & Plan Note (Signed)
Uncontrolled today though asymptomatic. Vitals:   08/15/23 1340 08/15/23 1405  BP: (!) 182/77 (!) 161/71  He has continued on metoprolol succinate, 75 mg BID. He is also on Torsemide 10 mg daily. Has not been taking Hydralazine TID - reports only taking it when he notices his BP is elevated. Reviewing prior notes, it seems like he had had problems with adherence to this medication. Losartan was initially added during the summer and then in October but have fallen off his medication lists. Similarly with amlodipine. Suspect hospitalizations and medication reconciliation issues have contributed to this.    To simplify his regimen, will plan for the following - Continue metoprolol BID - Start amlodipine 10 mg daily - Continue Furosemide 10 mg PRN - BMP today - Re-start Losartan 50 mg daily - Return to clinic in 1 month for BMP recheck and BP trend

## 2023-08-15 NOTE — Assessment & Plan Note (Addendum)
Patient with history of COPD with asthma who was recently admitted to the hospital for the second COPD exacerbation in the past 6 months. He remains on RA and transitioned to Trelegy for triple therapy, which he received through manufacturer assistance program (4 inhalers total). Reports that has been adherent to it and has only required two uses of albuterol inhaler since his discharge. Denies cough, dyspnea, or sputum production. Denies wheezing. Continues to take about 6000 steps daily and has returned to work, though part time.  Stable cardiopulmonary exam today.  -Continue Trelegy inhaler daily -Continue albuterol PRN -Referral for pulmonary rehab in the setting of 2 COPD exacerbations in the past 6 months -Rerturn to work letter without restrictions today

## 2023-08-15 NOTE — Assessment & Plan Note (Signed)
AKI on CKD3a. Baseline Cr. 1.1-3 GFR. Thought to be in the setting of overdiuresis while hospitalized. Cr. Was 1.5 at discharge. He has continued taking torsemide 10 mg as needed. Will recheck BMP today to assess for post discharge renal function.

## 2023-08-16 LAB — BMP8+ANION GAP
Anion Gap: 14 mmol/L (ref 10.0–18.0)
BUN/Creatinine Ratio: 13 (ref 10–24)
BUN: 15 mg/dL (ref 8–27)
CO2: 23 mmol/L (ref 20–29)
Calcium: 8.9 mg/dL (ref 8.6–10.2)
Chloride: 108 mmol/L — ABNORMAL HIGH (ref 96–106)
Creatinine, Ser: 1.15 mg/dL (ref 0.76–1.27)
Glucose: 82 mg/dL (ref 70–99)
Potassium: 3.8 mmol/L (ref 3.5–5.2)
Sodium: 145 mmol/L — ABNORMAL HIGH (ref 134–144)
eGFR: 65 mL/min/{1.73_m2} (ref >59)

## 2023-08-16 NOTE — Progress Notes (Signed)
 Internal Medicine Clinic Attending  Case discussed with the resident physician at the time of the visit.  We reviewed the patient's history, exam, and pertinent patient test results.  I agree with the assessment, diagnosis, and plan of care documented in the resident's note.

## 2023-08-23 ENCOUNTER — Encounter: Payer: Self-pay | Admitting: Student

## 2023-08-23 NOTE — Progress Notes (Signed)
Attempted calling patient x3 without success. Not able to leave voicemail. Na mildly elevated. AKI resolved. Will mail letter

## 2023-09-02 NOTE — Progress Notes (Signed)
Remote ICD transmission.   

## 2023-09-02 NOTE — Addendum Note (Signed)
Addended by: Elease Etienne A on: 09/02/2023 11:44 AM   Modules accepted: Orders

## 2023-09-13 ENCOUNTER — Other Ambulatory Visit: Payer: Self-pay

## 2023-09-18 ENCOUNTER — Encounter: Payer: Medicaid Other | Admitting: Student

## 2023-09-27 ENCOUNTER — Other Ambulatory Visit: Payer: Self-pay

## 2023-09-27 ENCOUNTER — Encounter: Payer: Self-pay | Admitting: Student

## 2023-09-27 ENCOUNTER — Ambulatory Visit (INDEPENDENT_AMBULATORY_CARE_PROVIDER_SITE_OTHER): Payer: Self-pay | Admitting: Student

## 2023-09-27 VITALS — BP 148/66 | HR 63 | Temp 97.5°F | Ht 68.5 in | Wt 140.4 lb

## 2023-09-27 DIAGNOSIS — I2510 Atherosclerotic heart disease of native coronary artery without angina pectoris: Secondary | ICD-10-CM

## 2023-09-27 DIAGNOSIS — I10 Essential (primary) hypertension: Secondary | ICD-10-CM

## 2023-09-27 MED ORDER — LOSARTAN POTASSIUM 100 MG PO TABS
50.0000 mg | ORAL_TABLET | Freq: Every day | ORAL | 11 refills | Status: AC
  Filled 2023-09-27: qty 30, 60d supply, fill #0
  Filled 2023-11-26: qty 30, 60d supply, fill #1

## 2023-09-27 MED ORDER — ATORVASTATIN CALCIUM 80 MG PO TABS
80.0000 mg | ORAL_TABLET | Freq: Every day | ORAL | 11 refills | Status: DC
  Filled 2023-09-27: qty 30, 30d supply, fill #0
  Filled 2023-11-13 – 2023-11-26 (×2): qty 30, 30d supply, fill #1
  Filled 2024-01-06: qty 30, 30d supply, fill #2

## 2023-09-27 MED ORDER — AMLODIPINE BESYLATE 10 MG PO TABS
10.0000 mg | ORAL_TABLET | Freq: Every day | ORAL | 3 refills | Status: DC
  Filled 2023-09-27: qty 90, 90d supply, fill #0

## 2023-09-27 NOTE — Assessment & Plan Note (Addendum)
Patient presenting for blood pressure follow-up after modification of blood pressure regiment last visit.  He does bring all his medications in with him today, and seems to be taking appropriate medications.  His blood pressure is improved, though persistently hypertensive at 148/66. Plan: Increase losartan from 50 mg to 100 mg daily. Refill amlodipine BMP today, please recheck at future visit in 1 month.

## 2023-09-27 NOTE — Patient Instructions (Signed)
Thank you, [name] for allowing [place] to provide your care today.  I have ordered the following tests for you:   Lab Orders         BMP8+Anion Gap        I have ordered the following medication/changed the following medications:   Stop the following medications: Medications Discontinued During This Encounter  Medication Reason   amLODipine (NORVASC) 10 MG tablet    losartan (COZAAR) 50 MG tablet    atorvastatin (LIPITOR) 80 MG tablet Reorder     Start the following medications: Meds ordered this encounter  Medications   amLODipine (NORVASC) 10 MG tablet    Sig: Take 1 tablet (10 mg total) by mouth daily.    Dispense:  90 tablet    Refill:  3   atorvastatin (LIPITOR) 80 MG tablet    Sig: Take 1 tablet (80 mg total) by mouth daily.    Dispense:  30 tablet    Refill:  11   losartan (COZAAR) 100 MG tablet    Sig: Take 0.5 tablets (50 mg total) by mouth daily.    Dispense:  30 tablet    Refill:  11      Follow up:  1 month  for BMP / BP check    We look forward to seeing you next time. Please call our clinic at [phone] if you have any questions or concerns. The best time to call is Monday-Friday from 9am-4pm, but there is someone available 24/7. If after hours or the weekend, call the main hospital number and ask for the Internal Medicine Resident On-Call. If you need medication refills, please notify your pharmacy one week in advance and they will send [place] a request.   Thank you for trusting me with your care. Wishing you the best!  Lovie Macadamia MD [facility]

## 2023-09-27 NOTE — Progress Notes (Signed)
Subjective:  CC: Hypertension follow-up  HPI:  [name] is a 83 y.o. person with a past medical history stated below and presents today for the stated chief complaint. Please see problem based assessment and plan for additional details.  Past Medical History:  Diagnosis Date   Acute renal failure (HCC) 05/31/2014   Angina decubitus (HCC) 06/15/2014   CAD (coronary artery disease)    a. s/p stent RCA DES 9/09; b. 2014 Attempted PCI of OM1 @ [place];  c. 04/2014 Cath: LAD 40-50p, D1 95-99 (chronic), LCX 30-40 inf branch, OM1 CTO, RCA 30-40p, RCA patent stent, EF 35%->Med Rx.   Chronic combined systolic and diastolic CHF (congestive heart failure) (HCC)    a. EF about 40 to 45% per echo in April 2013;  b. 04/2014 Echo: EF 20-25%, sev LVH, sev glob HK, Gr 1 DD, mildly reduced RV fxn, PASP . c. 01/2017: EF improved to 50-55%.    Chronic kidney disease (CKD), stage III (moderate) (HCC)    Chronic low back pain    Complete heart block (HCC)    COPD (chronic obstructive pulmonary disease) (HCC) dx 06/2013   PFTs 07/08/13: mod obst with resp to bronchodilator, moderate decrease diffusion, airtrapping   COPD with acute exacerbation (HCC) 01/17/2017   COPD with asthma (HCC)    07/08/13 PFT: FEV1 1.74L (66% pred, 30% change with BD), mod obst with resp to bronchodilator, moderate decrease diffusion, air-trapping 11/2013 Simple spiro>> clear obstruction, FEV1 1.30 L (47% pred) - trial of symbicort 160 2bid 01/26/15      Depression 09/15/2018   Assessment: Increased sadness and depression since losing job  Plan: Patient denies suicidal and homicidal ideations Patient would not like to start medication therapies at this time Will establish patient with [facility] for primary care   Dyslipidemia    a. on statin   Essential hypertension    HLD (hyperlipidemia)    HTN (hypertension)    a. Reports intolerance to hydralazine; b. no beta blockers 2/2 bradycardia;  c.  failed on ACE and ARB.   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 04/2014 Echo: EF 20-25%, sev glob HK.   Mixed Ischemic/Non-Ischemic Cardiomyopathy    probable mixed ischemic and non-ischemic    Noncompliance    OSA (obstructive sleep apnea) 04/18/2018   06/11/16 - home sleep study shows AHI of 2.9 an hour with the lowest SaO2 of 79% with an average of 93%  03/08/2018-Home sleep study-AHI 7/HR, SaO2 low 81%    Paroxysmal atrial fibrillation (HCC)    a. identified on device interrogation 01/2016   Pneumonia 09/02/2013   Pulmonary edema 05/10/2014   Respiratory arrest (HCC) 05/18/2014   Sinus bradycardia 05/31/2014   Ventricular tachycardia (HCC)    treated with ATP,  CL 250-300 msec    Current Outpatient Medications on File Prior to Visit  Medication Sig Dispense Refill   albuterol (VENTOLIN HFA) 108 (90 Base) MCG/ACT inhaler Inhale 2 puffs into the lungs every 6 (six) hours as needed for wheezing or shortness of breath. 6.7 g 3   aspirin EC 81 MG tablet Take 1 tablet (81 mg total) by mouth daily. Swallow whole. 120 tablet 0   clopidogrel (PLAVIX) 75 MG tablet Take 1 tablet (75 mg total) by mouth daily. 30 tablet 11   famotidine (PEPCID) 20  MG tablet Take 1 tablet (20 mg total) by mouth every evening. 30 tablet 0   fluticasone furoate-vilanterol (BREO ELLIPTA) 200-25 MCG/ACT AEPB Inhale 1 puff into the lungs daily. 60 each 1   Fluticasone-Umeclidin-Vilant (TRELEGY ELLIPTA) 200-62.5-25 MCG/ACT AEPB Inhale 1 Inhalation into the lungs daily.     gabapentin (NEURONTIN) 100 MG capsule Take 2 capsules (200 mg total) by mouth at bedtime as needed (for shoulder pain). 60 capsule 1   ipratropium-albuterol (DUONEB) 0.5-2.5 (3) MG/3ML SOLN Take 3 mLs by nebulization every 6 (six) hours as needed (wheezing or shortness of breath). 360 mL 1    metoprolol succinate (TOPROL-XL) 25 MG 24 hr tablet Take 1 tablet (25 mg total) by mouth 2 (two) times daily. 90 tablet 11   pantoprazole (PROTONIX) 40 MG tablet Take 1 tablet (40 mg total) by mouth 2 (two) times daily. 60 tablet 0   torsemide (DEMADEX) 20 MG tablet Take 1 tablet (20 mg total) by mouth daily as needed (fluid). 30 tablet 0   umeclidinium bromide (INCRUSE ELLIPTA) 62.5 MCG/ACT AEPB Inhale 1 puff into the lungs daily. 30 each 0   No current facility-administered medications on file prior to visit.    Review of Systems: Please see assessment and plan for pertinent positives and negatives.  Objective:   Vitals:   09/27/23 1035 09/27/23 1041  BP: (!) 154/67 (!) 148/66  Pulse: 81 63  Temp: (!) 97.5 F (36.4 C)   TempSrc: Oral   SpO2: 100%   Weight: 140 lb 6.4 oz (63.7 kg)   Height: 5' 8.5" (1.74 m)     Physical Exam: Constitutional: Well-appearing Cardiovascular: Regular rate and rhythm Pulmonary/Chest: lungs clear to auscultation bilaterally Abdominal: soft, non-tender, non-distended Extremities: No edema of the lower extremities bilaterally Psych: Pleasant affect Thought process is linear.     Assessment & Plan:  Essential hypertension Patient presenting for blood pressure follow-up after modification of blood pressure regiment last visit.  He does bring all his medications in with him today, and seems to be taking appropriate medications.  His blood pressure is improved, though persistently hypertensive at 148/66. Plan: Increase losartan from 50 mg to 100 mg daily. Refill amlodipine BMP today, please recheck at future visit in 1 month.  CAD (coronary artery disease) Denies any ongoing anginal chest pain or exertional dyspnea.  Will need DAPT indefinitely due to failed percutaneous coronary intervention.  No signs of bleeding at this time. Plan: Continue DAPT    Patient discussed with Dr. Sullivan Lone MD [facility]   [id number] Pager: [phone]  Phone: [phone] Date 09/27/2023  Time 5:24 PM

## 2023-09-27 NOTE — Assessment & Plan Note (Signed)
Denies any ongoing anginal chest pain or exertional dyspnea.  Will need DAPT indefinitely due to failed percutaneous coronary intervention.  No signs of bleeding at this time. Plan: Continue DAPT

## 2023-09-28 LAB — BMP8+ANION GAP
Anion Gap: 15 mmol/L (ref 10.0–18.0)
BUN/Creatinine Ratio: 15 (ref 10–24)
BUN: 17 mg/dL (ref 8–27)
CO2: 24 mmol/L (ref 20–29)
Calcium: 9.3 mg/dL (ref 8.6–10.2)
Chloride: 103 mmol/L (ref 96–106)
Creatinine, Ser: 1.16 mg/dL (ref 0.76–1.27)
Glucose: 86 mg/dL (ref 70–99)
Potassium: 4.6 mmol/L (ref 3.5–5.2)
Sodium: 142 mmol/L (ref 134–144)
eGFR: 64 mL/min/{1.73_m2} (ref >59)

## 2023-09-30 NOTE — Progress Notes (Signed)
 Internal Medicine Clinic Attending  Case discussed with the resident physician at the time of the visit.  We reviewed the patient's history, exam, and pertinent patient test results.  I agree with the assessment, diagnosis, and plan of care documented in the resident's note.

## 2023-10-06 NOTE — Progress Notes (Signed)
[name] Date of Birth: 03/09/1941 Medical Record #[id number]  History of Present Illness: Mr. Bolender is seen for follow up of CAD and CHF. He has multiple medical issues which include HTN,  HLD and known CAD. He has a history of tobacco abuse. He has LV dysfunction with ejection fraction of 40-45% in April 2013.  Not able to take higher beta blocker dose due to his resting bradycardia.  He had a Myoview study in March of 2013 which showed normal perfusion. Ejection fraction of 40%. He was admitted to [facility] in December 2014 with chest pain. Cardiac cath showed patent RCA stent. There was a 50% mid LAD lesion. The first diagonal and first OM were occluded. Attempted PCI of the OM but unable to cross with wire. Review of old films here showed that this was a chronic obstruction. He has a history of NSVT. He was admitted in  August 2015 with VDRF with acute pulmonary edema. Echo showed drop in EF to 20-25%. In September 2015 he was admitted with syncope associated with bradycardia and overdiuresis. Diruetics were reduced and coreg was held. Repeat Echo in November 2015 showed EF of 30%. He did undergo ICD/BiV implant  by Dr. Johney Frame.  In 2017  he complained of dyspnea on exertion. No clinical evidence of CHF. Echo showed EF 45-50%. Global HK. Mild MR. Myoview study showed a fixed inferolateral infarct without ischemia. EF 39%. He ultimately underwent CPX. This was limited by submaximal exercise and severe hypertensive response. There was mild COPD. Exercise training and improved BP control recommended. Toprol was increased.   Followed regularly in EP clinic for his ICD. Last Echo in 2019 showed EF 45% with moderate LVH  When seen in May 2021 noted more SOB. CXR was clear. BNP 118. Echo showed improved EF to 50-55%. No significant valvular disease.   Seen in the ED on 02/13/21 with dizziness. Patient states he was at work and almost passed out. Evaluation was negative except for urine  culture which grew Staph aureus. Treated with antibiotics. Device check showed brief NSVT that did not correlate with symptoms. He is did undergo generator change out of ICD in November 2022  Most recent cardiac catheterization in 12/2022 demonstrated 75% mid LAD, CTO of OM1 with with left to left collaterals, 80% P RCA, 75% mRCA (ISR), and 100% PLOM stenosis with left to left collaterals.  He underwent successful PCI/DES of mid LAD.  He later underwent staged PCI/DES x 2 overlapping in the mid to distal RCA.  Postintervention he had no reflow with distal embolization, elevated troponin.  Medical management was recommended.  Echocardiogram at the time showed EF 30 to 35%.  Limited echo on the day of discharge showed EF improved to 50 to 55%.  Repeat limited echo in 12/2022 showed EF 35 to 40%, hypokinesis in the inferior inferolateral wall, severely reduced RV systolic function.  He was referred to the heart failure TOC clinic.  He was last seen in the heart failure TOC clinic on 02/06/2023 and was stable overall from a cardiac standpoint.  Device interrogation showed signs of volume overload since early April.  Escalation of GDMT was limited in the setting of AKI on CKD.  He was started back on torsemide 20 mg daily.  He was started on Imdur and hydralazine.  He presented to the ED on 02/28/2023 in the setting of near syncope.  He reported a significant amount of personal stress with his wife had died the week  before.  He noted intermittent parents to diuretics.  He also noted some mild shortness of breath, chronic two-pillow orthopnea.  He denied palpitations.  Orthostatics were positive.  Troponin was mildly elevated with flat trend.  EKG was without ischemic changes.  Per ED physician report, device interrogation showed "multiple events," including 2 events from the day before that required a shock. There was concern for dehydration/AKI.  He deferred admission as his wife's funeral was scheduled for the following  day.  He was gently hydrated and discharged home in stable condition.   July 2024 admitted for respiratory failure hypertensive emergency with flash pulmonary edema.  Brilinta was switched to Plavix due to concern that it was contributing to his dyspnea.  Again readmitted 04/30/2023 for acute on chronic CHF and COPD exacerbation and diuresed.  He was seen by advanced heart failure in follow-up 04/29/2023 complaining about taking multiple medications and had been out of multiple ones and had not been taking his aspirin.  Reported compliancy with only his atorvastatin and his Plavix.  He was prescribed Imdur 15 mg at that appointment and reported intermittently using his torsemide/old prescription of Lasix.  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 He also has an admission for COPD exacerbation 05/16/2023 and noted to have increased burden of nonsustained SVT so his metoprolol was increased.  Seen by Dr. Elberta Fortis 05/28/2023 who noted device is functioning properly and was minimally being paced off his LBBB.  He adjusted the vector settings and now pacing off his LV lead.    He was admitted in October and again in November with CHF and COPD exacerbation.   On follow up today he is doing ok. States breathing is alright. No swelling. Weight is down. No cough. Takes demadex about twice a week. Doesn't like to take when he is working.    Current Outpatient Medications on File Prior to Visit  Medication Sig Dispense Refill   albuterol (VENTOLIN HFA) 108 (90 Base) MCG/ACT inhaler Inhale 2 puffs into the lungs every 6 (six) hours as needed for wheezing or shortness of breath. 6.7 g 3   aspirin EC 81 MG tablet Take 1 tablet (81 mg total) by mouth daily. Swallow whole. 120 tablet 0   atorvastatin (LIPITOR) 80 MG tablet Take 1 tablet (80 mg total) by mouth daily. 30 tablet 11   famotidine (PEPCID) 20 MG tablet Take 1 tablet (20 mg total) by mouth every  evening. 30 tablet 0   fluticasone furoate-vilanterol (BREO ELLIPTA) 200-25 MCG/ACT AEPB Inhale 1 puff into the lungs daily. 60 each 1   Fluticasone-Umeclidin-Vilant (TRELEGY ELLIPTA) 200-62.5-25 MCG/ACT AEPB Inhale 1 Inhalation into the lungs daily.     gabapentin (NEURONTIN) 100 MG capsule Take 2 capsules (200 mg total) by mouth at bedtime as needed (for shoulder pain). 60 capsule 1   ipratropium-albuterol (DUONEB) 0.5-2.5 (3) MG/3ML SOLN Take 3 mLs by nebulization every 6 (six) hours as needed (wheezing or shortness of breath). 360 mL 1   losartan (COZAAR) 100 MG tablet Take 0.5 tablets (50 mg total) by mouth daily. 30 tablet 11   metoprolol succinate (TOPROL-XL) 25 MG 24 hr tablet Take 1 tablet (25 mg total) by mouth 2 (two) times daily. 90 tablet 11   pantoprazole (PROTONIX) 40 MG tablet Take 1 tablet (40 mg total) by mouth 2 (two) times daily. 60 tablet 0   torsemide (DEMADEX) 20 MG tablet Take 1 tablet (20 mg total) by mouth  daily as needed (fluid). 30 tablet 0   umeclidinium bromide (INCRUSE ELLIPTA) 62.5 MCG/ACT AEPB Inhale 1 puff into the lungs daily. 30 each 0   No current facility-administered medications on file prior to visit.    Allergies  Allergen Reactions   Acyclovir And Related Other (See Comments)    Pt states he is not familiar with this medication.   Aspirin Other (See Comments)    GI upset at high doses only.    Past Medical History:  Diagnosis Date   Acute renal failure (HCC) 05/31/2014   Angina decubitus (HCC) 06/15/2014   CAD (coronary artery disease)    a. s/p stent RCA DES 9/09; b. 2014 Attempted PCI of OM1 @ [place];  c. 04/2014 Cath: LAD 40-50p, D1 95-99 (chronic), LCX 30-40 inf branch, OM1 CTO, RCA 30-40p, RCA patent stent, EF 35%->Med Rx.   Chronic combined systolic and diastolic CHF (congestive heart failure) (HCC)    a. EF about 40 to 45% per echo in April 2013;  b. 04/2014 Echo: EF 20-25%, sev LVH, sev glob HK, Gr 1 DD, mildly reduced RV fxn, PASP  . c. 01/2017: EF improved to 50-55%.    Chronic kidney disease (CKD), stage III (moderate) (HCC)    Chronic low back pain    Complete heart block (HCC)    COPD (chronic obstructive pulmonary disease) (HCC) dx 06/2013   PFTs 07/08/13: mod obst with resp to bronchodilator, moderate decrease diffusion, airtrapping   COPD with acute exacerbation (HCC) 01/17/2017   COPD with asthma (HCC)    07/08/13 PFT: FEV1 1.74L (66% pred, 30% change with BD), mod obst with resp to bronchodilator, moderate decrease diffusion, air-trapping 11/2013 Simple spiro>> clear obstruction, FEV1 1.30 L (47% pred) - trial of symbicort 160 2bid 01/26/15      Depression 09/15/2018   Assessment: Increased sadness and depression since losing job  Plan: Patient denies suicidal and homicidal ideations Patient would not like to start medication therapies at this time Will establish patient with community health and wellness for primary care   Dyslipidemia    a. on statin   Essential hypertension    HLD (hyperlipidemia)    HTN (hypertension)    a. Reports intolerance to hydralazine; b. no beta blockers 2/2 bradycardia;  c. failed on ACE and ARB.   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 04/2014 Echo: EF 20-25%, sev glob HK.   Mixed Ischemic/Non-Ischemic Cardiomyopathy    probable mixed ischemic and non-ischemic    Noncompliance    OSA (obstructive sleep apnea) 04/18/2018   06/11/16 - home sleep study shows AHI of 2.9 an hour with the lowest SaO2 of 79% with an average of 93%  03/08/2018-Home sleep study-AHI 7/HR, SaO2 low 81%    Paroxysmal atrial fibrillation (HCC)    a. identified on device interrogation 01/2016   Pneumonia 09/02/2013   Pulmonary edema 05/10/2014   Respiratory arrest (HCC) 05/18/2014   Sinus bradycardia 05/31/2014   Ventricular tachycardia (HCC)     treated with ATP,  CL 250-300 msec    Past Surgical History:  Procedure Laterality Date   BI-VENTRICULAR IMPLANTABLE CARDIOVERTER DEFIBRILLATOR N/A 08/12/2014   MDT Ovidio Kin XT CRTD implanted by Dr Johney Frame   BIV ICD GENERATOR CHANGEOUT N/A 08/08/2021   Procedure: BIV ICD GENERATOR CHANGEOUT;  Surgeon: Hillis Range, MD;  Location[facility] INVASIVE  CV LAB;  Service: Cardiovascular;  Laterality: N/A;   CARDIAC CATHETERIZATION     ejection fraction 50%   CORONARY STENT INTERVENTION N/A 12/10/2022   Procedure: CORONARY STENT INTERVENTION;  Surgeon: [place]azil[place], Dera Vanaken M, MD;  Location: [facility] INVASIVE CV LAB;  Service: Cardiovascular;  Laterality: N/A;   CORONARY STENT INTERVENTION N/A 12/12/2022   Procedure: CORONARY STENT INTERVENTION;  Surgeon: [place], Tymeka Privette M, MD;  Location: [facility] INVASIVE CV LAB;  Service: Cardiovascular;  Laterality: N/A;   CORONARY ULTRASOUND/IVUS N/A 12/10/2022   Procedure: Intravascular Ultrasound/IVUS;  Surgeon: [place], Rylyn Ranganathan M, MD;  Location: [facility] INVASIVE CV LAB;  Service: Cardiovascular;  Laterality: N/A;   CORONARY ULTRASOUND/IVUS N/A 12/12/2022   Procedure: Coronary Ultrasound/IVUS;  Surgeon: [place], Adeliz Tonkinson M, MD;  Location: [facility] INVASIVE CV LAB;  Service: Cardiovascular;  Laterality: N/A;   LEFT HEART CATH AND CORONARY ANGIOGRAPHY N/A 12/10/2022   Procedure: LEFT HEART CATH AND CORONARY ANGIOGRAPHY;  Surgeon: [place], Joceline Hinchcliff M, MD;  Location: [facility] INVASIVE CV LAB;  Service: Cardiovascular;  Laterality: N/A;   LEFT HEART CATHETERIZATION WITH CORONARY ANGIOGRAM N/A 05/08/2014   Procedure: LEFT HEART CATHETERIZATION WITH CORONARY ANGIOGRAM;  Surgeon: Micheline Chapman, MD;  Location: [facility] CATH LAB;  Service: Cardiovascular;  Laterality: N/A;    Social History   Tobacco Use  Smoking Status Some Days   Current packs/day: 0.00   Average packs/day: 0.5 packs/day for 56.0 years (28.0 ttl pk-yrs)   Types: Cigarettes   Start date: 09/10/1956   Last attempt to quit: 09/10/2012   Years since quitting: 11.0   Smokeless Tobacco Never  Tobacco Comments   tried 1 month ago (05/2018) but didn't like it     Social History   Substance and Sexual Activity  Alcohol Use No    Family History  Problem Relation Age of Onset   Heart attack Neg Hx    Stroke Neg Hx     Review of Systems: As noted in history of present illness  All other systems were reviewed and are negative.  Physical Exam: BP 136/78 (BP Location: Left Arm, Patient Position: Sitting, Cuff Size: Normal)   Pulse 63   Ht 5' 8.3" (1.735 m)   Wt 138 lb (62.6 kg)   SpO2 98%   BMI 20.80 kg/m  GENERAL:  Well appearing BM in NAD HEENT:  normal NECK:  No jugular venous distention, no bruits LUNGS:  Clear to auscultation bilaterally CHEST:  Unremarkable HEART:  RRR,  PMI not displaced or sustained,S1 and S2 within normal limits, no S3, no S4: no clicks, no rubs, no murmurs EXT:  2 + pulses throughout, no edema, no cyanosis no clubbing SKIN:  Warm and dry.  No rashes NEURO:  Alert and oriented x 3. Cranial nerves II through XII intact. PSYCH:  Cognitively intact     LABORATORY DATA:   Lab Results  Component Value Date   WBC 8.0 07/24/2023   HGB 10.5 (L) 07/24/2023   HCT 33.1 (L) 07/24/2023   PLT 151 07/24/2023   GLUCOSE 86 09/27/2023   CHOL 166 12/12/2022   TRIG 120 12/12/2022   HDL 48 12/12/2022   LDLDIRECT 132.9 06/18/2013   LDLCALC 94 12/12/2022   ALT 18 04/29/2023   AST 24 04/29/2023   NA 142 09/27/2023   K 4.6 09/27/2023   CL 103 09/27/2023   CREATININE 1.16 09/27/2023   BUN 17 09/27/2023   CO2 24 09/27/2023   TSH 6.682 (H) 04/02/2023   INR 1.1 05/16/2023   HGBA1C 6.1 (H) 12/12/2022  EKG Interpretation Date/Time:  Friday October 11 2023 09:41:00 EST Ventricular Rate:  63 PR Interval:  158 QRS Duration:  166 QT Interval:  474 QTC Calculation: 485 R Axis:   -89  Text Interpretation: AV dual-paced rhythm When compared with ECG of 23-Jul-2023 11:30, Vent. rate has decreased BY  24 BPM Confirmed by  [place], Niki Cosman [phone]) on 10/11/2023 10:14:15 AM    Cardiac cath 12/10/22:  CORONARY STENT INTERVENTION  LEFT HEART CATH AND CORONARY ANGIOGRAPHY  Intravascular Ultrasound/IVUS   Conclusion      Prox LAD to Mid LAD lesion is 70% stenosed.   Ramus lesion is 40% stenosed.   1st Mrg lesion is 100% stenosed.   Prox RCA lesion is 80% stenosed.   Prox RCA to Mid RCA lesion is 75% stenosed.   RPAV lesion is 100% stenosed.   A drug-eluting stent was successfully placed using a SYNERGY XD 3.50X38.   Post intervention, there is a 0% residual stenosis.   There is mild to moderate left ventricular systolic dysfunction.   LV end diastolic pressure is mildly elevated.   The left ventricular ejection fraction is 45-50% by visual estimate.   3 vessel obstructive CAD     - 75% mid LAD segemental stenosis with RFR 0.84     - 100% CTO of the first OM with left to left collaterals.     - 80% proximal RCA, 75% mid RCA in stent, 100% PLOM with left to right collaterals. 2. Mild to moderate LV dysfunction. EF estimated at 45%. 3. Mildly elevated LVEDP 4. Successful PCI of the mid LAD with IVUS guidance and DES (3.5 x 38 Synergy post dilated to 4.0)     Plan: DAPT indefinitely. Will keep overnight. Repeat Echo. Recheck BMET in am. If renal function is stable plan staged PCI of the proximal and mid RCA tomorrow. Would treat PL and OM occlusions medically.   Diagnostic Dominance: Right  Intervention 12/12/22:  CORONARY STENT INTERVENTION  Coronary Ultrasound/IVUS   Conclusion      Prox RCA lesion is 80% stenosed.   Prox RCA to Mid RCA lesion is 75% stenosed.   Ramus lesion is 40% stenosed.   1st Mrg lesion is 100% stenosed.   RPAV lesion is 100% stenosed.   Non-stenotic Prox LAD to Mid LAD lesion was previously treated.   A drug-eluting stent was successfully placed using a SYNERGY XD 3.50X38.   A drug-eluting stent was successfully placed using a STENT ONYX FRONTIER 3.5X38.   Post intervention,  there is a 0% residual stenosis.   Post intervention, there is a 0% residual stenosis.   Successful stenting of the proximal and mid RCA with overlapping 3.5 x 38 mm DES but with resultant no reflow due to poor distal vessel runoff   Plan: Brilinta monotherapy indefinitely due to ASA intolerance. Will monitor in ICU. Check troponins. IV Ntg and analgesics for pain.  Intervention      Echo: 04/03/23: IMPRESSIONS     1. Left ventricular ejection fraction, by estimation, is 40 to 45%. The  left ventricle has mildly decreased function. The left ventricle  demonstrates global hypokinesis. There is severe concentric left  ventricular hypertrophy. Left ventricular diastolic   function could not be evaluated. There is mild hypokinesis of the left  ventricular, basal-mid inferior wall and inferolateral wall.   2. Right ventricular systolic function is normal. The right ventricular  size is normal. There is mildly elevated pulmonary artery systolic  pressure.   3. Left atrial  size was moderately dilated.   4. Right atrial size was moderately dilated.   5. The mitral valve is normal in structure. Moderate mitral valve  regurgitation. No evidence of mitral stenosis.   6. The aortic valve is grossly normal. There is mild calcification of the  aortic valve. Aortic valve regurgitation is trivial. Aortic valve  sclerosis is present, with no evidence of aortic valve stenosis.   7. The inferior vena cava is dilated in size with >50% respiratory  variability, suggesting right atrial pressure of 8 mmHg.   Comparison(s): No significant change from prior study.    Assessment / Plan: 1. Chronic systolic and diastolic CHF with mixed cardiomyopathy. Last EF 40-45%. He is euvolemic today. Stressed importance of sodium restriction. On losartan and metoprolol. Will add jardiance 10 mg daily. Will hold off on aldactone since he had AKI in hospital.  2. Coronary disease with remote stenting of the right  coronary in 2009 with a drug-eluting stent. Chronic occlusion of OM. He is s/p stenting of the mid LAD in April 2024. This was followed by staged PCI of the proximal to mid RCA. Would favor chronic DAPT given extensive nature of stents. No angina currently. Continue metoprolol and amlodipine.   3. Tobacco abuse. States his is no longer smoking. Continue Symbicort for COPD  4. Hyperlipidemia  - last LDL 94 in April 2024 - need to update labs. He is on high dose statin. If not at goal may need to add Zetia.   5. COPD with asthma.  6. HTN - well controlled.   7. NSVT s/p ICD. Asymptomatic.  8. S/p ICD/BiV pacemaker. S/p generator change out Nov 2022.     Follow up in 3 months with APP. [name]

## 2023-10-11 ENCOUNTER — Encounter: Payer: Self-pay | Admitting: Cardiology

## 2023-10-11 ENCOUNTER — Ambulatory Visit: Payer: Self-pay | Attending: Cardiology | Admitting: Cardiology

## 2023-10-11 ENCOUNTER — Other Ambulatory Visit: Payer: Self-pay

## 2023-10-11 ENCOUNTER — Telehealth (HOSPITAL_COMMUNITY): Payer: Self-pay | Admitting: Licensed Clinical Social Worker

## 2023-10-11 VITALS — BP 136/78 | HR 63 | Ht 68.3 in | Wt 138.0 lb

## 2023-10-11 DIAGNOSIS — I10 Essential (primary) hypertension: Secondary | ICD-10-CM

## 2023-10-11 DIAGNOSIS — R079 Chest pain, unspecified: Secondary | ICD-10-CM

## 2023-10-11 DIAGNOSIS — I2510 Atherosclerotic heart disease of native coronary artery without angina pectoris: Secondary | ICD-10-CM

## 2023-10-11 MED ORDER — AMLODIPINE BESYLATE 10 MG PO TABS
10.0000 mg | ORAL_TABLET | Freq: Every day | ORAL | 3 refills | Status: DC
  Filled 2023-10-11: qty 90, 90d supply, fill #0
  Filled 2024-01-06: qty 90, 90d supply, fill #1
  Filled 2024-04-09: qty 90, 90d supply, fill #2

## 2023-10-11 MED ORDER — EMPAGLIFLOZIN 10 MG PO TABS: 10.0000 mg | ORAL_TABLET | Freq: Every day | ORAL | Status: DC

## 2023-10-11 MED ORDER — CLOPIDOGREL BISULFATE 75 MG PO TABS
75.0000 mg | ORAL_TABLET | Freq: Every day | ORAL | 3 refills | Status: DC
  Filled 2023-10-11: qty 90, 90d supply, fill #0
  Filled 2024-01-06: qty 90, 90d supply, fill #1
  Filled 2024-04-09: qty 90, 90d supply, fill #2

## 2023-10-11 MED ORDER — EMPAGLIFLOZIN 10 MG PO TABS
10.0000 mg | ORAL_TABLET | Freq: Every day | ORAL | 3 refills | Status: DC
  Filled 2023-10-11: qty 90, 90d supply, fill #0
  Filled 2023-10-11: qty 30, 30d supply, fill #0
  Filled 2023-11-26 – 2023-11-27 (×2): qty 30, 30d supply, fill #1

## 2023-10-11 NOTE — Addendum Note (Signed)
Addended by: Neoma Laming on: 10/11/2023 10:20 AM   Modules accepted: Orders

## 2023-10-11 NOTE — Addendum Note (Signed)
Addended by: Neoma Laming on: 10/11/2023 10:32 AM   Modules accepted: Orders

## 2023-10-11 NOTE — Patient Instructions (Addendum)
Medication Instructions:  Start Jardiance 10 mg every morning before breakfast Continue all other medications *If you need a refill on your cardiac medications before your next appointment, please call your pharmacy*   Lab Work: Bmet,lipid and hepatic panels today   Testing/Procedures: None ordered   Follow-Up: At [facility], you and your health needs are our priority.  As part of our continuing mission to provide you with exceptional heart care, we have created designated Provider Care Teams.  These Care Teams include your primary Cardiologist (physician) and Advanced Practice Providers (APPs -  Physician Assistants and Nurse Practitioners) who all work together to provide you with the care you need, when you need it.  We recommend signing up for the patient portal called "MyChart".  Sign up information is provided on this After Visit Summary.  MyChart is used to connect with patients for Virtual Visits (Telemedicine).  Patients are able to view lab/test results, encounter notes, upcoming appointments, etc.  Non-urgent messages can be sent to your provider as well.   To learn more about what you can do with MyChart, go to [email or web address].    Your next appointment:  3 months  Monday 4/21 at 10:05 am    Provider:  Oris Drone NP

## 2023-10-11 NOTE — Telephone Encounter (Signed)
CSW received message from [facility] pharmacy team asking for assistance in getting patient signed up for [facility] so he can receive his jardiance in the mail for free as he does not have prescription insurance.  CSW attempted to call pt to inform of need to complete application and to mail out to him to complete- unable to reach- left VM- will send out application with written instructions to patient as it has been difficult to reach patient by phone in the past.  Will continue to attempt contact and assist as needed  Burna Sis, LCSW Clinical Social Worker Advanced [facility] Failure Clinic Desk#: [phone] Cell#: [phone]

## 2023-10-12 LAB — BASIC METABOLIC PANEL
BUN/Creatinine Ratio: 12 (ref 10–24)
BUN: 15 mg/dL (ref 8–27)
CO2: 24 mmol/L (ref 20–29)
Calcium: 9.3 mg/dL (ref 8.6–10.2)
Chloride: 105 mmol/L (ref 96–106)
Creatinine, Ser: 1.24 mg/dL (ref 0.76–1.27)
Glucose: 80 mg/dL (ref 70–99)
Potassium: 4.3 mmol/L (ref 3.5–5.2)
Sodium: 144 mmol/L (ref 134–144)
eGFR: 59 mL/min/{1.73_m2} — ABNORMAL LOW (ref >59)

## 2023-10-12 LAB — HEPATIC FUNCTION PANEL
ALT: 11 [IU]/L (ref 0–44)
AST: 21 [IU]/L (ref 0–40)
Albumin: 4.7 g/dL (ref 3.8–4.8)
Alkaline Phosphatase: 117 [IU]/L (ref 44–121)
Bilirubin Total: 0.7 mg/dL (ref 0.0–1.2)
Bilirubin, Direct: 0.25 mg/dL (ref 0.00–0.40)
Total Protein: 7.1 g/dL (ref 6.0–8.5)

## 2023-10-12 LAB — LIPID PANEL
Chol/HDL Ratio: 2.7 {ratio} (ref 0.0–5.0)
Cholesterol, Total: 168 mg/dL (ref 100–199)
HDL: 62 mg/dL (ref >39)
LDL Chol Calc (NIH): 88 mg/dL (ref 0–99)
Triglycerides: 99 mg/dL (ref 0–149)
VLDL Cholesterol Cal: 18 mg/dL (ref 5–40)

## 2023-10-14 ENCOUNTER — Telehealth: Payer: Self-pay | Admitting: Cardiology

## 2023-10-14 NOTE — Telephone Encounter (Signed)
New Message:    Patient says he need Dr Elvis Coil nurse to give him a call. He says he needs to explain something to her.
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
## 2023-10-25 ENCOUNTER — Ambulatory Visit: Payer: Self-pay | Admitting: Student

## 2023-10-25 ENCOUNTER — Other Ambulatory Visit: Payer: Self-pay

## 2023-10-25 VITALS — BP 146/66 | HR 62 | Temp 97.6°F | Ht 68.0 in | Wt 138.2 lb

## 2023-10-25 DIAGNOSIS — I2510 Atherosclerotic heart disease of native coronary artery without angina pectoris: Secondary | ICD-10-CM

## 2023-10-25 DIAGNOSIS — I110 Hypertensive heart disease with heart failure: Secondary | ICD-10-CM

## 2023-10-25 DIAGNOSIS — I10 Essential (primary) hypertension: Secondary | ICD-10-CM

## 2023-10-25 DIAGNOSIS — R42 Dizziness and giddiness: Secondary | ICD-10-CM

## 2023-10-25 DIAGNOSIS — F32A Depression, unspecified: Secondary | ICD-10-CM

## 2023-10-25 DIAGNOSIS — K219 Gastro-esophageal reflux disease without esophagitis: Secondary | ICD-10-CM

## 2023-10-25 MED ORDER — ASPIRIN 81 MG PO TBEC
81.0000 mg | DELAYED_RELEASE_TABLET | Freq: Every day | ORAL | 0 refills | Status: AC
  Filled 2023-10-25: qty 120, 120d supply, fill #0

## 2023-10-25 MED ORDER — METOPROLOL SUCCINATE ER 25 MG PO TB24
75.0000 mg | ORAL_TABLET | Freq: Two times a day (BID) | ORAL | 11 refills | Status: DC
  Filled 2023-10-25: qty 90, 15d supply, fill #0

## 2023-10-25 MED ORDER — PANTOPRAZOLE SODIUM 40 MG PO TBEC
40.0000 mg | DELAYED_RELEASE_TABLET | Freq: Two times a day (BID) | ORAL | 0 refills | Status: DC
  Filled 2023-10-25: qty 60, 30d supply, fill #0

## 2023-10-25 MED ORDER — SERTRALINE HCL 50 MG PO TABS
50.0000 mg | ORAL_TABLET | Freq: Every day | ORAL | 2 refills | Status: DC
  Filled 2023-10-25: qty 30, 30d supply, fill #0
  Filled 2023-11-26: qty 30, 30d supply, fill #1
  Filled 2024-01-06: qty 30, 30d supply, fill #2

## 2023-10-25 MED ORDER — FAMOTIDINE 20 MG PO TABS
20.0000 mg | ORAL_TABLET | Freq: Every evening | ORAL | 0 refills | Status: DC
  Filled 2023-10-25: qty 30, 30d supply, fill #0

## 2023-10-25 NOTE — Assessment & Plan Note (Signed)
Patient notes at least 53-months of depressed mood, decreased appetite, and weight loss that has been bothersome after the passing of his wife last year.  No dysphagia.  Discussed treatment with therapy and medication.  He is interested in a medication.  Will start an SSRI after discussing the risk benefits and alternatives. - Start sertraline 50 mg daily

## 2023-10-25 NOTE — Patient Instructions (Addendum)
  Thank you, [name], for allowing [place] to provide your care today. We will start a medication for your depression today called sertraline. It can cause nausea, diarrhea, fatigue. If these occur and are bothersome enough to stop the medication let us know. These medications take about 4-6 weeks to start working so try to be patient and don't stop the medication unless you have bad side effects. Otherwise we will see you about 6 weeks and we will see how it is working.    Follow up:  6 weeks     Remember:     Should you have any questions or concerns please call the internal medicine clinic at [phone].     Rocky Morel, DO [facility]

## 2023-10-25 NOTE — Assessment & Plan Note (Signed)
Patient describes mild dizziness or lightheadedness that happens when he walks quickly or for a long time.  It is associated with dyspnea but not with any other symptoms including chest pain, syncope, falls, nausea, vomiting.  He does not have any orthostatic symptoms and his blood pressures at home have not been lower than normal.  I think this is most likely due to his significant cardiac disease on top of stable COPD and that continued daily exercise to improve tolerance is the best plan at this time.  He does not have any signs on history or physical of COPD or heart failure exacerbation.

## 2023-10-25 NOTE — Assessment & Plan Note (Signed)
Blood pressure today 146/66.  Since increasing losartan to 100 mg at his last visit he has not had any orthostatic symptoms.  He has been checking his blood pressure at home and is predominantly been in the 130 systolic.  He had a BMP with his cardiologist on 10/11/2023 that was stable after increasing losartan. - Continue amlodipine 10 mg daily, losartan 100 mg daily, and metoprolol succinate 75 mg twice daily

## 2023-10-25 NOTE — Progress Notes (Signed)
CC: Routine Follow Up   HPI:  [name] is a 83 y.o. 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 Please see assessment and plan below for further details.  Past Medical History:  Diagnosis Date   Acute renal failure (HCC) 05/31/2014   Angina decubitus (HCC) 06/15/2014   CAD (coronary artery disease)    a. s/p stent RCA DES 9/09; b. 2014 Attempted PCI of OM1 @ [place];  c. 04/2014 Cath: LAD 40-50p, D1 95-99 (chronic), LCX 30-40 inf branch, OM1 CTO, RCA 30-40p, RCA patent stent, EF 35%->Med Rx.   Chronic combined systolic and diastolic CHF (congestive heart failure) (HCC)    a. EF about 40 to 45% per echo in April 2013;  b. 04/2014 Echo: EF 20-25%, sev LVH, sev glob HK, Gr 1 DD, mildly reduced RV fxn, PASP . c. 01/2017: EF improved to 50-55%.    Chronic kidney disease (CKD), stage III (moderate) (HCC)    Chronic low back pain    Complete heart block (HCC)    COPD (chronic obstructive pulmonary disease) (HCC) dx 06/2013   PFTs 07/08/13: mod obst with resp to bronchodilator, moderate decrease diffusion, airtrapping   COPD with acute exacerbation (HCC) 01/17/2017   COPD with asthma (HCC)    07/08/13 PFT: FEV1 1.74L (66% pred, 30% change with BD), mod obst with resp to bronchodilator, moderate decrease diffusion, air-trapping 11/2013 Simple spiro>> clear obstruction, FEV1 1.30 L (47% pred) - trial of symbicort 160 2bid 01/26/15      Depression 09/15/2018   Assessment: Increased sadness and depression since losing job  Plan: Patient denies suicidal and homicidal ideations Patient would not like to start medication therapies at this time Will establish patient with [facility] for primary care   Dyslipidemia    a. on statin   Essential hypertension    HLD (hyperlipidemia)    HTN (hypertension)    a. Reports intolerance to hydralazine; b. no beta  blockers 2/2 bradycardia;  c. failed on ACE and ARB.   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 04/2014 Echo: EF 20-25%, sev glob HK.   Mixed Ischemic/Non-Ischemic Cardiomyopathy    probable mixed ischemic and non-ischemic    Noncompliance    OSA (obstructive sleep apnea) 04/18/2018   06/11/16 - home sleep study shows AHI of 2.9 an hour with the lowest SaO2 of 79% with an average of 93%  03/08/2018-Home sleep study-AHI 7/HR, SaO2 low 81%    Paroxysmal atrial fibrillation (HCC)    a. identified on device interrogation 01/2016   Pneumonia 09/02/2013   Pulmonary edema 05/10/2014   Respiratory arrest (HCC) 05/18/2014   Sinus bradycardia 05/31/2014   Ventricular tachycardia (HCC)    treated with ATP,  CL 250-300 msec    Current Outpatient Medications  Medication Instructions   albuterol (VENTOLIN HFA) 108 (90 Base) MCG/ACT inhaler 2 puffs, Inhalation, Every 6 hours PRN   amLODipine (NORVASC) 10 mg, Oral, Daily   aspirin EC 81 mg, Oral, Daily, Swallow whole.   atorvastatin (LIPITOR) 80 mg, Oral, Daily   clopidogrel (PLAVIX) 75 mg, Oral, Daily   empagliflozin (JARDIANCE) 10 mg, Oral, Daily before breakfast   famotidine (PEPCID) 20 mg, Oral, Every evening  fluticasone furoate-vilanterol (BREO ELLIPTA) 200-25 MCG/ACT AEPB 1 puff, Inhalation, Daily   Fluticasone-Umeclidin-Vilant (TRELEGY ELLIPTA) 200-62.5-25 MCG/ACT AEPB 1 Inhalation, Inhalation, Daily   gabapentin (NEURONTIN) 200 mg, Oral, At bedtime PRN   ipratropium-albuterol (DUONEB) 0.5-2.5 (3) MG/3ML SOLN 3 mLs, Nebulization, Every 6 hours PRN   Jardiance 10 mg, Oral, Daily before breakfast   losartan (COZAAR) 50 mg, Oral, Daily   metoprolol succinate (TOPROL-XL) 75 mg, Oral, 2 times daily   pantoprazole (PROTONIX) 40 mg, Oral, 2 times daily   sertraline (ZOLOFT) 50 mg, Oral, Daily    torsemide (DEMADEX) 20 mg, Oral, Daily PRN   umeclidinium bromide (INCRUSE ELLIPTA) 62.5 MCG/ACT AEPB 1 puff, Inhalation, Daily     Review of Systems:   Pertinent items noted in HPI and/or A&P.  Physical Exam:  Vitals:   10/25/23 1001 10/25/23 1059  BP: (!) 155/63 (!) 146/66  Pulse: 73 62  Temp: 97.6 F (36.4 C)   TempSrc: Oral   SpO2: 100%   Weight: 138 lb 3.2 oz (62.7 kg)   Height: 5\' 8"  (1.727 m)     Constitutional: Well-appearing early male. In no acute distress. HEENT: Normocephalic, atraumatic, Sclera non-icteric, PERRL, EOM intact, no JVD Cardio:Regular rate and rhythm. 2+ bilateral radial pulses. Pulm:Clear to auscultation bilaterally. Normal work of breathing on room air. Abdomen: Soft, non-tender, non-distended, positive bowel sounds. ZOX:WRUEAVWU for extremity edema. Skin:Warm and dry. Neuro:Alert and oriented x3. No focal deficit noted. Psych:Pleasant mood and affect.   Assessment & Plan:   Essential hypertension Blood pressure today 146/66.  Since increasing losartan to 100 mg at his last visit he has not had any orthostatic symptoms.  He has been checking his blood pressure at home and is predominantly been in the 130 systolic.  He had a BMP with his cardiologist on 10/11/2023 that was stable after increasing losartan. - Continue amlodipine 10 mg daily, losartan 100 mg daily, and metoprolol succinate 75 mg twice daily  Depression Patient notes at least 67-months of depressed mood, decreased appetite, and weight loss that has been bothersome after the passing of his wife last year.  No dysphagia.  Discussed treatment with therapy and medication.  He is interested in a medication.  Will start an SSRI after discussing the risk benefits and alternatives. - Start sertraline 50 mg daily  Dizziness Patient describes mild dizziness or lightheadedness that happens when he walks quickly or for a long time.  It is associated with dyspnea but not with any other symptoms  including chest pain, syncope, falls, nausea, vomiting.  He does not have any orthostatic symptoms and his blood pressures at home have not been lower than normal.  I think this is most likely due to his significant cardiac disease on top of stable COPD and that continued daily exercise to improve tolerance is the best plan at this time.  He does not have any signs on history or physical of COPD or heart failure exacerbation.    Patient discussed with Dr. Claudie Revering, DO [facility] Internal Medicine Resident PGY-2 Clinic Phone: [phone] Pager: [phone]

## 2023-10-31 ENCOUNTER — Other Ambulatory Visit: Payer: Self-pay

## 2023-10-31 NOTE — Progress Notes (Signed)
 Internal Medicine Clinic Attending  Case discussed with the resident at the time of the visit.  We reviewed the resident's history and exam and pertinent patient test results.  I agree with the assessment, diagnosis, and plan of care documented in the resident's note.

## 2023-11-01 ENCOUNTER — Telehealth (HOSPITAL_COMMUNITY): Payer: Self-pay | Admitting: Licensed Clinical Social Worker

## 2023-11-01 NOTE — Telephone Encounter (Signed)
CSW attempted to call pt to follow up regarding [employer] application- unable to reach- left VM requesting return call  Burna Sis, LCSW Clinical Social Worker [facility] Desk#: [phone] Cell#: [phone]

## 2023-11-08 ENCOUNTER — Ambulatory Visit (INDEPENDENT_AMBULATORY_CARE_PROVIDER_SITE_OTHER): Payer: Medicaid Other

## 2023-11-08 DIAGNOSIS — I255 Ischemic cardiomyopathy: Secondary | ICD-10-CM

## 2023-11-11 LAB — CUP PACEART REMOTE DEVICE CHECK
Battery Remaining Longevity: 44 mo
Battery Voltage: 2.95 V
Brady Statistic AP VP Percent: 51.11 %
Brady Statistic AP VS Percent: 0.17 %
Brady Statistic AS VP Percent: 48.22 %
Brady Statistic AS VS Percent: 0.5 %
Brady Statistic RA Percent Paced: 49.97 %
Brady Statistic RV Percent Paced: 97.69 %
Date Time Interrogation Session: 20250228132209
HighPow Impedance: 66 Ohm
Implantable Lead Connection Status: 753985
Implantable Lead Connection Status: 753985
Implantable Lead Connection Status: 753985
Implantable Lead Implant Date: 20151203
Implantable Lead Implant Date: 20151203
Implantable Lead Implant Date: 20151203
Implantable Lead Location: 753858
Implantable Lead Location: 753859
Implantable Lead Location: 753860
Implantable Lead Model: 4598
Implantable Lead Model: 5076
Implantable Lead Model: 6935
Implantable Pulse Generator Implant Date: 20221129
Lead Channel Impedance Value: 180.5 Ohm
Lead Channel Impedance Value: 180.5 Ohm
Lead Channel Impedance Value: 189.525
Lead Channel Impedance Value: 189.525
Lead Channel Impedance Value: 189.525
Lead Channel Impedance Value: 285 Ohm
Lead Channel Impedance Value: 361 Ohm
Lead Channel Impedance Value: 361 Ohm
Lead Channel Impedance Value: 361 Ohm
Lead Channel Impedance Value: 361 Ohm
Lead Channel Impedance Value: 399 Ohm
Lead Channel Impedance Value: 399 Ohm
Lead Channel Impedance Value: 456 Ohm
Lead Channel Impedance Value: 608 Ohm
Lead Channel Impedance Value: 646 Ohm
Lead Channel Impedance Value: 646 Ohm
Lead Channel Impedance Value: 646 Ohm
Lead Channel Impedance Value: 665 Ohm
Lead Channel Pacing Threshold Amplitude: 0.625 V
Lead Channel Pacing Threshold Amplitude: 1 V
Lead Channel Pacing Threshold Amplitude: 1.25 V
Lead Channel Pacing Threshold Pulse Width: 0.4 ms
Lead Channel Pacing Threshold Pulse Width: 0.4 ms
Lead Channel Pacing Threshold Pulse Width: 0.8 ms
Lead Channel Sensing Intrinsic Amplitude: 0.875 mV
Lead Channel Sensing Intrinsic Amplitude: 0.875 mV
Lead Channel Sensing Intrinsic Amplitude: 23.625 mV
Lead Channel Sensing Intrinsic Amplitude: 23.625 mV
Lead Channel Setting Pacing Amplitude: 1.5 V
Lead Channel Setting Pacing Amplitude: 2.5 V
Lead Channel Setting Pacing Amplitude: 2.5 V
Lead Channel Setting Pacing Pulse Width: 0.4 ms
Lead Channel Setting Pacing Pulse Width: 0.8 ms
Lead Channel Setting Sensing Sensitivity: 0.3 mV
Zone Setting Status: 755011
Zone Setting Status: 755011

## 2023-11-13 ENCOUNTER — Ambulatory Visit: Payer: Self-pay | Attending: Pulmonary Disease | Admitting: Pulmonary Disease

## 2023-11-13 ENCOUNTER — Other Ambulatory Visit: Payer: Self-pay

## 2023-11-13 ENCOUNTER — Encounter: Payer: Self-pay | Admitting: Pulmonary Disease

## 2023-11-13 VITALS — BP 128/64 | HR 75 | Ht 68.0 in | Wt 140.2 lb

## 2023-11-13 DIAGNOSIS — I4729 Other ventricular tachycardia: Secondary | ICD-10-CM

## 2023-11-13 DIAGNOSIS — I10 Essential (primary) hypertension: Secondary | ICD-10-CM

## 2023-11-13 DIAGNOSIS — I5022 Chronic systolic (congestive) heart failure: Secondary | ICD-10-CM

## 2023-11-13 DIAGNOSIS — Z95810 Presence of automatic (implantable) cardiac defibrillator: Secondary | ICD-10-CM

## 2023-11-13 DIAGNOSIS — I25118 Atherosclerotic heart disease of native coronary artery with other forms of angina pectoris: Secondary | ICD-10-CM

## 2023-11-13 LAB — CUP PACEART INCLINIC DEVICE CHECK
Battery Remaining Longevity: 43 mo
Battery Voltage: 2.96 V
Brady Statistic AP VP Percent: 59.2 %
Brady Statistic AP VS Percent: 0.14 %
Brady Statistic AS VP Percent: 39.95 %
Brady Statistic AS VS Percent: 0.72 %
Brady Statistic RA Percent Paced: 57.45 %
Brady Statistic RV Percent Paced: 96.97 %
Date Time Interrogation Session: 20250305172849
HighPow Impedance: 64 Ohm
Implantable Lead Connection Status: 753985
Implantable Lead Connection Status: 753985
Implantable Lead Connection Status: 753985
Implantable Lead Implant Date: 20151203
Implantable Lead Implant Date: 20151203
Implantable Lead Implant Date: 20151203
Implantable Lead Location: 753858
Implantable Lead Location: 753859
Implantable Lead Location: 753860
Implantable Lead Model: 4598
Implantable Lead Model: 5076
Implantable Lead Model: 6935
Implantable Pulse Generator Implant Date: 20221129
Lead Channel Impedance Value: 175.622
Lead Channel Impedance Value: 184.154
Lead Channel Impedance Value: 189.525
Lead Channel Impedance Value: 189.525
Lead Channel Impedance Value: 199.5 Ohm
Lead Channel Impedance Value: 285 Ohm
Lead Channel Impedance Value: 342 Ohm
Lead Channel Impedance Value: 342 Ohm
Lead Channel Impedance Value: 361 Ohm
Lead Channel Impedance Value: 399 Ohm
Lead Channel Impedance Value: 399 Ohm
Lead Channel Impedance Value: 418 Ohm
Lead Channel Impedance Value: 456 Ohm
Lead Channel Impedance Value: 608 Ohm
Lead Channel Impedance Value: 646 Ohm
Lead Channel Impedance Value: 646 Ohm
Lead Channel Impedance Value: 646 Ohm
Lead Channel Impedance Value: 665 Ohm
Lead Channel Pacing Threshold Amplitude: 0.625 V
Lead Channel Pacing Threshold Amplitude: 0.875 V
Lead Channel Pacing Threshold Amplitude: 1.25 V
Lead Channel Pacing Threshold Pulse Width: 0.4 ms
Lead Channel Pacing Threshold Pulse Width: 0.4 ms
Lead Channel Pacing Threshold Pulse Width: 0.8 ms
Lead Channel Sensing Intrinsic Amplitude: 0.625 mV
Lead Channel Sensing Intrinsic Amplitude: 0.75 mV
Lead Channel Sensing Intrinsic Amplitude: 7.125 mV
Lead Channel Sensing Intrinsic Amplitude: 7.75 mV
Lead Channel Setting Pacing Amplitude: 1.5 V
Lead Channel Setting Pacing Amplitude: 2.5 V
Lead Channel Setting Pacing Amplitude: 2.5 V
Lead Channel Setting Pacing Pulse Width: 0.4 ms
Lead Channel Setting Pacing Pulse Width: 0.8 ms
Lead Channel Setting Sensing Sensitivity: 0.3 mV
Zone Setting Status: 755011
Zone Setting Status: 755011
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
## 2023-11-13 NOTE — Patient Instructions (Signed)
 Medication Instructions:  Your physician recommends that you continue on your current medications as directed. Please refer to the Current Medication list given to you today.  *If you need a refill on your cardiac medications before your next appointment, please call your pharmacy*  Lab Work: None ordered If you have labs (blood work) drawn today and your tests are completely normal, you will receive your results only by: MyChart Message (if you have MyChart) OR A paper copy in the mail If you have any lab test that is abnormal or we need to change your treatment, we will call you to review the results.  Follow-Up: At [facility], you and your health needs are our priority.  As part of our continuing mission to provide you with exceptional heart care, we have created designated Provider Care Teams.  These Care Teams include your primary Cardiologist (physician) and Advanced Practice Providers (APPs -  Physician Assistants and Nurse Practitioners) who all work together to provide you with the care you need, when you need it.  We recommend signing up for the patient portal called "MyChart".  Sign up information is provided on this After Visit Summary.  MyChart is used to connect with patients for Virtual Visits (Telemedicine).  Patients are able to view lab/test results, encounter notes, upcoming appointments, etc.  Non-urgent messages can be sent to your provider as well.   To learn more about what you can do with MyChart, go to [email or web address].    Your next appointment:   6 month(s)  Provider:   You may see Will Jorja Loa, MD or one of the following Advanced Practice Providers on your designated Care Team:   Francis Dowse, [place] [address], [place] Sherie Don, NP Canary Brim, NP

## 2023-11-26 ENCOUNTER — Other Ambulatory Visit: Payer: Self-pay

## 2023-11-26 ENCOUNTER — Other Ambulatory Visit: Payer: Self-pay | Admitting: Student

## 2023-11-26 DIAGNOSIS — K219 Gastro-esophageal reflux disease without esophagitis: Secondary | ICD-10-CM

## 2023-11-26 MED ORDER — FAMOTIDINE 20 MG PO TABS
20.0000 mg | ORAL_TABLET | Freq: Every evening | ORAL | 0 refills | Status: DC
  Filled 2023-11-26: qty 30, 30d supply, fill #0

## 2023-11-26 NOTE — Telephone Encounter (Signed)
 Medication sent to pharmacy

## 2023-11-27 ENCOUNTER — Other Ambulatory Visit: Payer: Self-pay

## 2023-11-27 ENCOUNTER — Telehealth: Payer: Self-pay | Admitting: Cardiology

## 2023-11-27 NOTE — Telephone Encounter (Signed)
*  STAT* If patient is at the pharmacy, call can be transferred to refill team.   1. Which medications need to be refilled? (please list name of each medication and dose if known)   empagliflozin (JARDIANCE) 10 MG TABS tablet   2. Would you like to learn more about the convenience, safety, & potential cost savings by using the [facility]?   3. Are you open to using the [facility] (Type [facility]. ).  4. Which pharmacy/location (including street and city if local pharmacy) is medication to be sent to?  [facility]   5. Do they need a 30 day or 90 day supply?   90  Patient stated he is completely out of this medication.

## 2023-12-02 ENCOUNTER — Telehealth: Payer: Self-pay | Admitting: Cardiology

## 2023-12-02 NOTE — Telephone Encounter (Signed)
 Patient has some serious questions to ask. Would like to talk with doctor or nurse
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
## 2023-12-06 NOTE — Telephone Encounter (Signed)
 Called patient left message on personal voice mail Dr.Jordan typed letter.Letter left at front desk for pick up.

## 2023-12-09 NOTE — Addendum Note (Signed)
 Addended by: Elease Etienne A on: 12/09/2023 11:34 AM   Modules accepted: Orders

## 2023-12-09 NOTE — Progress Notes (Signed)
 Remote ICD transmission.

## 2023-12-13 ENCOUNTER — Other Ambulatory Visit: Payer: Self-pay

## 2023-12-13 DIAGNOSIS — I10 Essential (primary) hypertension: Secondary | ICD-10-CM

## 2023-12-13 DIAGNOSIS — I2510 Atherosclerotic heart disease of native coronary artery without angina pectoris: Secondary | ICD-10-CM

## 2023-12-13 MED ORDER — METOPROLOL SUCCINATE ER 50 MG PO TB24
50.0000 mg | ORAL_TABLET | Freq: Every day | ORAL | 3 refills | Status: DC
  Filled 2023-12-13: qty 90, 90d supply, fill #0

## 2023-12-24 ENCOUNTER — Other Ambulatory Visit: Payer: Self-pay

## 2023-12-30 ENCOUNTER — Ambulatory Visit: Payer: Medicaid Other | Admitting: General Practice

## 2024-01-06 ENCOUNTER — Other Ambulatory Visit: Payer: Self-pay

## 2024-01-09 ENCOUNTER — Telehealth: Payer: Self-pay | Admitting: Cardiology

## 2024-01-09 NOTE — Telephone Encounter (Signed)
  1. Has your device fired? Patient does not think his device is working  2. Is you device beeping?   3. Are you experiencing draining or swelling at device site?   4. Are you calling to see if we received your device transmission?   5. Have you passed out?    Please route to Device Clinic Pool

## 2024-01-09 NOTE — Telephone Encounter (Signed)
 Looks like this may be his remote monitor. He was seen in clinic 11/13/23 and ICD check and was WNL. Routing to [facility] pool for f/u.

## 2024-01-09 NOTE — Telephone Encounter (Signed)
 I called the pt with tech support on the phone but got disconnected.

## 2024-02-07 ENCOUNTER — Ambulatory Visit: Payer: Self-pay | Admitting: Cardiology

## 2024-02-07 ENCOUNTER — Ambulatory Visit (INDEPENDENT_AMBULATORY_CARE_PROVIDER_SITE_OTHER): Payer: Self-pay

## 2024-02-07 DIAGNOSIS — I255 Ischemic cardiomyopathy: Secondary | ICD-10-CM

## 2024-02-07 LAB — CUP PACEART REMOTE DEVICE CHECK
Battery Remaining Longevity: 36 mo
Battery Voltage: 2.96 V
Brady Statistic AP VP Percent: 88.71 %
Brady Statistic AP VS Percent: 0.14 %
Brady Statistic AS VP Percent: 11.09 %
Brady Statistic AS VS Percent: 0.07 %
Brady Statistic RA Percent Paced: 87.61 %
Brady Statistic RV Percent Paced: 98 %
Date Time Interrogation Session: 20250530001605
HighPow Impedance: 56 Ohm
Implantable Lead Connection Status: 753985
Implantable Lead Connection Status: 753985
Implantable Lead Connection Status: 753985
Implantable Lead Implant Date: 20151203
Implantable Lead Implant Date: 20151203
Implantable Lead Implant Date: 20151203
Implantable Lead Location: 753858
Implantable Lead Location: 753859
Implantable Lead Location: 753860
Implantable Lead Model: 4598
Implantable Lead Model: 5076
Implantable Lead Model: 6935
Implantable Pulse Generator Implant Date: 20221129
Lead Channel Impedance Value: 171 Ohm
Lead Channel Impedance Value: 175.622
Lead Channel Impedance Value: 175.622
Lead Channel Impedance Value: 175.622
Lead Channel Impedance Value: 180.5 Ohm
Lead Channel Impedance Value: 247 Ohm
Lead Channel Impedance Value: 304 Ohm
Lead Channel Impedance Value: 342 Ohm
Lead Channel Impedance Value: 342 Ohm
Lead Channel Impedance Value: 361 Ohm
Lead Channel Impedance Value: 361 Ohm
Lead Channel Impedance Value: 361 Ohm
Lead Channel Impedance Value: 456 Ohm
Lead Channel Impedance Value: 589 Ohm
Lead Channel Impedance Value: 589 Ohm
Lead Channel Impedance Value: 608 Ohm
Lead Channel Impedance Value: 608 Ohm
Lead Channel Impedance Value: 646 Ohm
Lead Channel Pacing Threshold Amplitude: 0.625 V
Lead Channel Pacing Threshold Amplitude: 0.875 V
Lead Channel Pacing Threshold Amplitude: 1.25 V
Lead Channel Pacing Threshold Pulse Width: 0.4 ms
Lead Channel Pacing Threshold Pulse Width: 0.4 ms
Lead Channel Pacing Threshold Pulse Width: 0.8 ms
Lead Channel Sensing Intrinsic Amplitude: 1 mV
Lead Channel Sensing Intrinsic Amplitude: 1 mV
Lead Channel Sensing Intrinsic Amplitude: 13.375 mV
Lead Channel Sensing Intrinsic Amplitude: 13.375 mV
Lead Channel Setting Pacing Amplitude: 1.5 V
Lead Channel Setting Pacing Amplitude: 2.5 V
Lead Channel Setting Pacing Amplitude: 2.5 V
Lead Channel Setting Pacing Pulse Width: 0.4 ms
Lead Channel Setting Pacing Pulse Width: 0.8 ms
Lead Channel Setting Sensing Sensitivity: 0.3 mV
Zone Setting Status: 755011
Zone Setting Status: 755011

## 2024-02-11 ENCOUNTER — Ambulatory Visit: Payer: Self-pay

## 2024-03-26 NOTE — Progress Notes (Signed)
 Remote ICD transmission.

## 2024-04-09 ENCOUNTER — Other Ambulatory Visit: Payer: Self-pay

## 2024-04-09 ENCOUNTER — Other Ambulatory Visit: Payer: Self-pay | Admitting: Student

## 2024-04-09 DIAGNOSIS — F32A Depression, unspecified: Secondary | ICD-10-CM

## 2024-04-09 DIAGNOSIS — I10 Essential (primary) hypertension: Secondary | ICD-10-CM

## 2024-04-09 DIAGNOSIS — I2510 Atherosclerotic heart disease of native coronary artery without angina pectoris: Secondary | ICD-10-CM

## 2024-04-09 DIAGNOSIS — K219 Gastro-esophageal reflux disease without esophagitis: Secondary | ICD-10-CM

## 2024-04-09 MED ORDER — SERTRALINE HCL 50 MG PO TABS
50.0000 mg | ORAL_TABLET | Freq: Every day | ORAL | 1 refills | Status: DC
  Filled 2024-04-09: qty 30, 30d supply, fill #0

## 2024-04-09 MED ORDER — FAMOTIDINE 20 MG PO TABS
20.0000 mg | ORAL_TABLET | Freq: Every evening | ORAL | 1 refills | Status: DC
  Filled 2024-04-09: qty 30, 30d supply, fill #0

## 2024-04-09 NOTE — Telephone Encounter (Signed)
 Copied from CRM [id number]. Topic: Clinical - Medication Refill >> Apr 09, 2024 12:30 PM Cleave MATSU wrote: Medication: amlodipine , clopidogrel , sertraline , atorvastatin , famotidine  and losartan   Has the patient contacted their pharmacy? Yes (Agent: If no, request that the patient contact the pharmacy for the refill. If patient does not wish to contact the pharmacy document the reason why and proceed with request.) (Agent: If yes, when and what did the pharmacy advise?)  This is the patient's preferred pharmacy:  [facility] [address] [place] [address] Phone: [phone] Fax: [phone]  Is this the correct pharmacy for this prescription? Yes If no, delete pharmacy and type the correct one.   Has the prescription been filled recently? No  Is the patient out of the medication? Yes  Has the patient been seen for an appointment in the last year OR does the patient have an upcoming appointment? Yes  Can we respond through MyChart? No  Agent: Please be advised that Rx refills may take up to 3 business days. We ask that you follow-up with your pharmacy.

## 2024-04-10 ENCOUNTER — Other Ambulatory Visit: Payer: Self-pay

## 2024-05-08 ENCOUNTER — Ambulatory Visit (INDEPENDENT_AMBULATORY_CARE_PROVIDER_SITE_OTHER): Payer: Self-pay

## 2024-05-08 DIAGNOSIS — I5022 Chronic systolic (congestive) heart failure: Secondary | ICD-10-CM

## 2024-05-09 LAB — CUP PACEART REMOTE DEVICE CHECK
Battery Remaining Longevity: 31 mo
Battery Voltage: 2.94 V
Brady Statistic AP VP Percent: 89.44 %
Brady Statistic AP VS Percent: 0.11 %
Brady Statistic AS VP Percent: 10.39 %
Brady Statistic AS VS Percent: 0.05 %
Brady Statistic RA Percent Paced: 88.78 %
Brady Statistic RV Percent Paced: 97.74 %
Date Time Interrogation Session: 20250829031607
HighPow Impedance: 56 Ohm
Implantable Lead Connection Status: 753985
Implantable Lead Connection Status: 753985
Implantable Lead Connection Status: 753985
Implantable Lead Implant Date: 20151203
Implantable Lead Implant Date: 20151203
Implantable Lead Implant Date: 20151203
Implantable Lead Location: 753858
Implantable Lead Location: 753859
Implantable Lead Location: 753860
Implantable Lead Model: 4598
Implantable Lead Model: 5076
Implantable Lead Model: 6935
Implantable Pulse Generator Implant Date: 20221129
Lead Channel Impedance Value: 175.622
Lead Channel Impedance Value: 175.622
Lead Channel Impedance Value: 180.5 Ohm
Lead Channel Impedance Value: 180.5 Ohm
Lead Channel Impedance Value: 180.5 Ohm
Lead Channel Impedance Value: 247 Ohm
Lead Channel Impedance Value: 285 Ohm
Lead Channel Impedance Value: 342 Ohm
Lead Channel Impedance Value: 361 Ohm
Lead Channel Impedance Value: 361 Ohm
Lead Channel Impedance Value: 361 Ohm
Lead Channel Impedance Value: 361 Ohm
Lead Channel Impedance Value: 418 Ohm
Lead Channel Impedance Value: 589 Ohm
Lead Channel Impedance Value: 589 Ohm
Lead Channel Impedance Value: 608 Ohm
Lead Channel Impedance Value: 608 Ohm
Lead Channel Impedance Value: 646 Ohm
Lead Channel Pacing Threshold Amplitude: 0.625 V
Lead Channel Pacing Threshold Amplitude: 0.875 V
Lead Channel Pacing Threshold Amplitude: 1.25 V
Lead Channel Pacing Threshold Pulse Width: 0.4 ms
Lead Channel Pacing Threshold Pulse Width: 0.4 ms
Lead Channel Pacing Threshold Pulse Width: 0.8 ms
Lead Channel Sensing Intrinsic Amplitude: 1.75 mV
Lead Channel Sensing Intrinsic Amplitude: 1.75 mV
Lead Channel Sensing Intrinsic Amplitude: 17.875 mV
Lead Channel Sensing Intrinsic Amplitude: 17.875 mV
Lead Channel Setting Pacing Amplitude: 1.5 V
Lead Channel Setting Pacing Amplitude: 2.5 V
Lead Channel Setting Pacing Amplitude: 2.5 V
Lead Channel Setting Pacing Pulse Width: 0.4 ms
Lead Channel Setting Pacing Pulse Width: 0.8 ms
Lead Channel Setting Sensing Sensitivity: 0.3 mV
Zone Setting Status: 755011
Zone Setting Status: 755011

## 2024-05-10 ENCOUNTER — Emergency Department (HOSPITAL_COMMUNITY)
Admission: EM | Admit: 2024-05-10 | Discharge: 2024-05-10 | Disposition: A | Discharge status: Home / Self Care | Payer: Self-pay | Attending: Emergency Medicine | Admitting: Emergency Medicine

## 2024-05-10 ENCOUNTER — Encounter (HOSPITAL_COMMUNITY): Payer: Self-pay

## 2024-05-10 ENCOUNTER — Other Ambulatory Visit: Payer: Self-pay

## 2024-05-10 DIAGNOSIS — N3000 Acute cystitis without hematuria: Secondary | ICD-10-CM | POA: Insufficient documentation

## 2024-05-10 DIAGNOSIS — Z7902 Long term (current) use of antithrombotics/antiplatelets: Secondary | ICD-10-CM | POA: Insufficient documentation

## 2024-05-10 LAB — COMPREHENSIVE METABOLIC PANEL WITH GFR
ALT: 14 U/L (ref 0–44)
AST: 22 U/L (ref 15–41)
Albumin: 4.2 g/dL (ref 3.5–5.0)
Alkaline Phosphatase: 91 U/L (ref 38–126)
Anion gap: 11 (ref 5–15)
BUN: 13 mg/dL (ref 8–23)
CO2: 24 mmol/L (ref 22–32)
Calcium: 9.4 mg/dL (ref 8.9–10.3)
Chloride: 105 mmol/L (ref 98–111)
Creatinine, Ser: 1.11 mg/dL (ref 0.61–1.24)
GFR, Estimated: >60 mL/min (ref >60)
Glucose, Bld: 102 mg/dL — ABNORMAL HIGH (ref 70–99)
Potassium: 3.9 mmol/L (ref 3.5–5.1)
Sodium: 140 mmol/L (ref 135–145)
Total Bilirubin: 1.1 mg/dL (ref 0.0–1.2)
Total Protein: 7.5 g/dL (ref 6.5–8.1)

## 2024-05-10 LAB — URINALYSIS, W/ REFLEX TO CULTURE (INFECTION SUSPECTED)
Bilirubin Urine: NEGATIVE
Glucose, UA: NEGATIVE mg/dL
Ketones, ur: NEGATIVE mg/dL
Nitrite: POSITIVE — AB
Protein, ur: 100 mg/dL — AB
RBC / HPF: >50 RBC/hpf (ref 0–5)
Specific Gravity, Urine: 1.014 (ref 1.005–1.030)
WBC, UA: >50 WBC/hpf (ref 0–5)
pH: 5 (ref 5.0–8.0)

## 2024-05-10 LAB — CBC WITH DIFFERENTIAL/PLATELET
Abs Immature Granulocytes: 0.05 K/uL (ref 0.00–0.07)
Basophils Absolute: 0 K/uL (ref 0.0–0.1)
Basophils Relative: 0 %
Eosinophils Absolute: 0.1 K/uL (ref 0.0–0.5)
Eosinophils Relative: 1 %
HCT: 38.3 % — ABNORMAL LOW (ref 39.0–52.0)
Hemoglobin: 12 g/dL — ABNORMAL LOW (ref 13.0–17.0)
Immature Granulocytes: 1 %
Lymphocytes Relative: 15 %
Lymphs Abs: 1.6 K/uL (ref 0.7–4.0)
MCH: 28.2 pg (ref 26.0–34.0)
MCHC: 31.3 g/dL (ref 30.0–36.0)
MCV: 89.9 fL (ref 80.0–100.0)
Monocytes Absolute: 1 K/uL (ref 0.1–1.0)
Monocytes Relative: 10 %
Neutro Abs: 7.7 K/uL (ref 1.7–7.7)
Neutrophils Relative %: 73 %
Platelets: 194 K/uL (ref 150–400)
RBC: 4.26 MIL/uL (ref 4.22–5.81)
RDW: 14.6 % (ref 11.5–15.5)
WBC: 10.4 K/uL (ref 4.0–10.5)
nRBC: 0 % (ref 0.0–0.2)

## 2024-05-10 LAB — I-STAT CG4 LACTIC ACID, ED: Lactic Acid, Venous: 1.5 mmol/L (ref 0.5–1.9)

## 2024-05-10 MED ORDER — SULFAMETHOXAZOLE-TRIMETHOPRIM 800-160 MG PO TABS: 1.0000 | ORAL_TABLET | Freq: Two times a day (BID) | ORAL | 0 refills | Status: DC

## 2024-05-10 MED ORDER — SULFAMETHOXAZOLE-TRIMETHOPRIM 800-160 MG PO TABS
1.0000 | ORAL_TABLET | Freq: Two times a day (BID) | ORAL | 0 refills | Status: AC
Start: 2024-05-10 — End: 2024-05-22

## 2024-05-10 MED ORDER — SULFAMETHOXAZOLE-TRIMETHOPRIM 800-160 MG PO TABS
1.0000 | ORAL_TABLET | Freq: Once | ORAL | Status: AC
  Administered 2024-05-10: 1 via ORAL
  Filled 2024-05-10: qty 1

## 2024-05-10 NOTE — ED Triage Notes (Signed)
 Pt c/o urinary frequency and urgency and burning with urination that started yesterday. Pt states he feels like his prostate may be enlarged because he has pain in groin. Pt denies fevers, nausea, vomiting, diarrhea or constipation.

## 2024-05-10 NOTE — ED Notes (Signed)
 Pt verbalized understanding of discharge instructions. Pt is ambulatory and verbalized that he would adhere to abx schedule.

## 2024-05-10 NOTE — Discharge Instructions (Addendum)
 Take the antibiotic for the full 14 days. Follow up with your doctor if symptoms persist.   As we discussed, a culture is pending on your urine specimen to ensure you are getting the appropriate antibiotic. This can be reviewed with your doctor in 2-3 days.   Return to the ED if you have any worsening symptoms - fever, severe pain, unable to pass urine, or new concern.

## 2024-05-10 NOTE — ED Provider Notes (Signed)
 [place] EMERGENCY DEPARTMENT AT [facility] Provider Note   CSN: [id number] Arrival date & time: 05/10/24  0543     Patient presents with: Urinary Frequency   [name] is a 83 y.o. male.   Patient to ED with symptoms of urinary frequency and producing small amounts of nonbloody urine for the past 2 days. No fever, nausea, vomiting. No history of urinary tract infections or prostate problems.   The history is provided by the patient. No language interpreter was used.  Urinary Frequency       Prior to Admission medications   Medication Sig Start Date End Date Taking? Authorizing Provider  albuterol  (VENTOLIN  HFA) 108 (90 Base) MCG/ACT inhaler Inhale 2 puffs into the lungs every 6 (six) hours as needed for wheezing or shortness of breath. 07/26/23   Tobie Gaines, DO  amLODipine  (NORVASC ) 10 MG tablet Take 1 tablet (10 mg total) by mouth daily. 10/11/23   [place], Peter M, MD  atorvastatin  (LIPITOR ) 80 MG tablet Take 1 tablet (80 mg total) by mouth daily. 09/27/23   Gabino Boga, MD  clopidogrel  (PLAVIX ) 75 MG tablet Take 1 tablet (75 mg total) by mouth daily. 10/11/23   [place], Peter M, MD  empagliflozin  (JARDIANCE ) 10 MG TABS tablet Take 1 tablet (10 mg total) by mouth daily before breakfast. 10/11/23   [place], Peter M, MD  famotidine  (PEPCID ) 20 MG tablet Take 1 tablet (20 mg total) by mouth every evening. 11/26/23   Jolaine Pac, DO  famotidine  (PEPCID ) 20 MG tablet Take 1 tablet (20 mg total) by mouth every evening. 04/09/24   Jolaine Pac, DO  fluticasone  furoate-vilanterol (BREO ELLIPTA ) 200-25 MCG/ACT AEPB Inhale 1 puff into the lungs daily. 07/27/23   Tobie Gaines, DO  Fluticasone -Umeclidin-Vilant (TRELEGY ELLIPTA ) 200-62.5-25 MCG/ACT AEPB Inhale 1 Inhalation into the lungs daily. 07/26/23   Tobie Gaines, DO  gabapentin  (NEURONTIN ) 100 MG capsule Take 2 capsules (200 mg total) by mouth at bedtime as needed (for shoulder pain). 07/26/23   Tobie Gaines, DO   ipratropium-albuterol  (DUONEB) 0.5-2.5 (3) MG/3ML SOLN Take 3 mLs by nebulization every 6 (six) hours as needed (wheezing or shortness of breath). 07/26/23   Tobie Gaines, DO  losartan  (COZAAR ) 100 MG tablet Take 0.5 tablets (50 mg total) by mouth daily. 09/27/23   Gabino Boga, MD  metoprolol  succinate (TOPROL -XL) 50 MG 24 hr tablet Take 1 tablet (50 mg total) by mouth daily. 12/13/23   Camnitz, Soyla Lunger, MD  pantoprazole  (PROTONIX ) 40 MG tablet Take 1 tablet (40 mg total) by mouth 2 (two) times daily. 10/25/23   Jolaine Pac, DO  sertraline  (ZOLOFT ) 50 MG tablet Take 1 tablet (50 mg total) by mouth daily. 10/25/23 10/24/24  Jolaine Pac, DO  sertraline  (ZOLOFT ) 50 MG tablet Take 1 tablet (50 mg total) by mouth daily. 04/09/24   Jolaine Pac, DO  sulfamethoxazole -trimethoprim  (BACTRIM  DS) 800-160 MG tablet Take 1 tablet by mouth 2 (two) times daily for 12 days. 05/10/24 05/22/24  Odell Balls, PA-C  torsemide  (DEMADEX ) 20 MG tablet Take 1 tablet (20 mg total) by mouth daily as needed (fluid). 07/26/23   Tobie Gaines, DO  umeclidinium bromide  (INCRUSE ELLIPTA ) 62.5 MCG/ACT AEPB Inhale 1 puff into the lungs daily. 07/27/23   Tobie Gaines, DO    Allergies: Acyclovir and related and Aspirin     Review of Systems  Genitourinary:  Positive for frequency.    Updated Vital Signs BP (!) 159/70 (BP Location: Right Arm)   Pulse 62  Temp 97.7 F (36.5 C)   Resp 18   Ht 5' 8.5 (1.74 m)   Wt 72.6 kg   SpO2 100%   BMI 23.97 kg/m   Physical Exam Vitals and nursing note reviewed.  Constitutional:      Appearance: He is well-developed.  HENT:     Head: Normocephalic.  Cardiovascular:     Rate and Rhythm: Normal rate.  Pulmonary:     Effort: Pulmonary effort is normal.  Abdominal:     Palpations: Abdomen is soft.     Tenderness: There is no abdominal tenderness. There is no guarding or rebound.  Musculoskeletal:        General: Normal range of motion.     Cervical back: Normal  range of motion and neck supple.  Skin:    General: Skin is warm and dry.  Neurological:     General: No focal deficit present.     Mental Status: He is alert and oriented to person, place, and time.     (all labs ordered are listed, but only abnormal results are displayed) Labs Reviewed  COMPREHENSIVE METABOLIC PANEL WITH GFR - Abnormal; Notable for the following components:      Result Value   Glucose, Bld 102 (*)    All other components within normal limits  CBC WITH DIFFERENTIAL/PLATELET - Abnormal; Notable for the following components:   Hemoglobin 12.0 (*)    HCT 38.3 (*)    All other components within normal limits  URINALYSIS, W/ REFLEX TO CULTURE (INFECTION SUSPECTED) - Abnormal; Notable for the following components:   APPearance CLOUDY (*)    Hgb urine dipstick LARGE (*)    Protein, ur 100 (*)    Nitrite POSITIVE (*)    Leukocytes,Ua LARGE (*)    Bacteria, UA RARE (*)    All other components within normal limits  URINE CULTURE  I-STAT CG4 LACTIC ACID, ED    EKG: None  Radiology: No results found.   Procedures   Medications Ordered in the ED  sulfamethoxazole -trimethoprim  (BACTRIM  DS) 800-160 MG per tablet 1 tablet (1 tablet Oral Given 05/10/24 0810)    Clinical Course as of 05/10/24 0829  Sun May 10, 2024  0828 Patient to ED with urinary frequency. No fever, nausea, hematuria. The patient declined prostate exam.   Urine c/w UTI. Will cover with Bactrim  x 2 weeks as we cannot determine prostate vs cystitis. Culture pending.  [SU]    Clinical Course User Index [SU] Odell Balls, PA-C                                 Medical Decision Making Amount and/or Complexity of Data Reviewed Labs: ordered.  Risk Prescription drug management.        Final diagnoses:  Acute cystitis without hematuria    ED Discharge Orders          Ordered    sulfamethoxazole -trimethoprim  (BACTRIM  DS) 800-160 MG tablet  2 times daily,   Status:  Discontinued         05/10/24 0814    sulfamethoxazole -trimethoprim  (BACTRIM  DS) 800-160 MG tablet  2 times daily        05/10/24 0814               Odell Balls, PA-C 05/10/24 0829    Elnor Jayson LABOR, DO 05/11/24 [id number]

## 2024-05-12 ENCOUNTER — Ambulatory Visit: Payer: Self-pay

## 2024-05-12 LAB — URINE CULTURE: Culture: >=100000 — AB

## 2024-05-13 ENCOUNTER — Telehealth (HOSPITAL_BASED_OUTPATIENT_CLINIC_OR_DEPARTMENT_OTHER): Payer: Self-pay | Admitting: *Deleted

## 2024-05-13 NOTE — Telephone Encounter (Signed)
 Post ED Visit - Positive Culture Follow-up  Culture report reviewed by antimicrobial stewardship pharmacist: [facility]  [x]  Venetia Gully, Pharm.D., BCPS AQ-ID []  Garrel Crews, Pharm.D., BCPS []  Almarie Lunger, [address].D., BCPS []  [place], [address].D., BCPS, AAHIVP []  Rosaline Bihari, Pharm.D., BCPS, AAHIVP []  Vernell Meier, PharmD, BCPS []  Latanya Hint, PharmD, BCPS []  Donald Medley, PharmD, BCPS []  Rocky Bold, PharmD []  Dorothyann Alert, PharmD, BCPS []  Morene Babe, PharmD  [facility] []  Rosaline Edison, PharmD []  Romona Bliss, PharmD []  Dolphus Roller, PharmD []  Veva Seip, Rph []  Vernell Daunt) Leonce, PharmD []  Eva Allis, PharmD []  Rosaline Millet, PharmD []  Iantha Batch, PharmD []  Arvin Gauss, PharmD []  Wanda Hasting, PharmD []  Ronal Rav, PharmD []  Rocky Slade, PharmD []  Bard Jeans, PharmD   Positive urine culture Treated with Sulfamethoxazole -Trimethoprim , organism sensitive to the same and no further patient follow-up is required at this time.  Jama Lisle Blondie 05/13/2024, 10:40 AM

## 2024-05-15 ENCOUNTER — Ambulatory Visit: Payer: Self-pay | Admitting: Cardiology

## 2024-05-18 NOTE — Progress Notes (Signed)
Remote ICD Transmission.

## 2024-05-22 ENCOUNTER — Other Ambulatory Visit (HOSPITAL_COMMUNITY): Payer: Self-pay

## 2024-06-08 ENCOUNTER — Other Ambulatory Visit (HOSPITAL_COMMUNITY): Payer: Self-pay

## 2024-06-09 ENCOUNTER — Telehealth: Payer: Self-pay

## 2024-06-09 ENCOUNTER — Other Ambulatory Visit (HOSPITAL_COMMUNITY): Payer: Self-pay

## 2024-06-09 DIAGNOSIS — J4489 Other specified chronic obstructive pulmonary disease: Secondary | ICD-10-CM

## 2024-06-09 NOTE — Progress Notes (Signed)
 Pharmacy Medication Assistance Program Note    06/09/2024  Patient ID: [name], male  DOB: 05/17/1941, 83 y.o.  MRN:  [id number]     06/09/2024  Outreach Medication Two  Manufacturer Medication Two [employer] ([employer]  [employer] ([employer]) Drugs Trelegy  Type of Assistance Manufacturer Assistance  Date Application Sent to Patient 06/09/2024  Application Items Requested Application;Proof of Income     Renewal   Mailed to patient

## 2024-06-23 MED ORDER — TRELEGY ELLIPTA 200-62.5-25 MCG/ACT IN AEPB: 1.0000 | INHALATION_SPRAY | Freq: Every day | RESPIRATORY_TRACT | 2 refills | Status: DC

## 2024-06-23 NOTE — Telephone Encounter (Signed)
 Patient may have went ahead and mailed application to company.   Rec'd RX request from [employer].    Could a signed hardcopy RX be placed in my box for patients Trelegy, written for a 3 month supply with refills? Thanks!

## 2024-06-24 ENCOUNTER — Other Ambulatory Visit (HOSPITAL_COMMUNITY): Payer: Self-pay

## 2024-06-24 NOTE — Telephone Encounter (Signed)
 Faxed RX to [employer] P[facility].  #[phone] with 2 refills.

## 2024-06-29 ENCOUNTER — Other Ambulatory Visit: Payer: Self-pay

## 2024-06-29 ENCOUNTER — Ambulatory Visit (HOSPITAL_COMMUNITY)
Admission: EM | Admit: 2024-06-29 | Discharge: 2024-06-29 | Disposition: A | Discharge status: Home / Self Care | Payer: Self-pay | Attending: Family Medicine | Admitting: Family Medicine

## 2024-06-29 DIAGNOSIS — N390 Urinary tract infection, site not specified: Secondary | ICD-10-CM | POA: Insufficient documentation

## 2024-06-29 DIAGNOSIS — R319 Hematuria, unspecified: Secondary | ICD-10-CM | POA: Insufficient documentation

## 2024-06-29 LAB — POCT URINALYSIS DIP (MANUAL ENTRY)
Bilirubin, UA: NEGATIVE
Glucose, UA: >=1000 mg/dL — AB
Ketones, POC UA: NEGATIVE mg/dL
Nitrite, UA: NEGATIVE
Protein Ur, POC: >=300 mg/dL — AB
Spec Grav, UA: 1.02 (ref 1.010–1.025)
Urobilinogen, UA: 0.2 U/dL
pH, UA: 5.5 (ref 5.0–8.0)

## 2024-06-29 LAB — POCT FASTING CBG KUC MANUAL ENTRY: POCT Glucose (KUC): 90 mg/dL (ref 70–99)

## 2024-06-29 MED ORDER — CEFTRIAXONE SODIUM 1 G IJ SOLR
INTRAMUSCULAR | Status: AC
  Filled 2024-06-29: qty 10

## 2024-06-29 MED ORDER — CEFTRIAXONE SODIUM 1 G IJ SOLR
1.0000 g | Freq: Once | INTRAMUSCULAR | Status: AC
  Administered 2024-06-29: 1 g via INTRAMUSCULAR

## 2024-06-29 MED ORDER — CIPROFLOXACIN HCL 500 MG PO TABS
500.0000 mg | ORAL_TABLET | Freq: Two times a day (BID) | ORAL | 0 refills | Status: DC
  Filled 2024-06-29: qty 14, 7d supply, fill #0

## 2024-06-29 MED ORDER — LIDOCAINE HCL (PF) 1 % IJ SOLN
INTRAMUSCULAR | Status: AC
  Filled 2024-06-29: qty 2

## 2024-06-29 NOTE — ED Provider Notes (Signed)
 [facility]    CSN: [id number] Arrival date & time: 06/29/24  1046      History   Chief Complaint Chief Complaint  Patient presents with   Dysuria   Fever    HPI [name] is a 83 y.o. male.   Patient presents to clinic for concerns of urinary frequency, urgency and dysuria for the past 3 days.  Was attempting to go to work today but had urinary incontinence in the car so he decided to come in.  Has had subjective hot and cold chills and potential fever at home, has not measured temperature.  Was treated for acute cystitis back in August with 13 days of Bactrim .  Reports this helped his symptoms temporarily, but it did not seem to completely resolve them.  Did have a urine culture that showed susceptibility to the Bactrim .  Has had some suprapubic discomfort.  Denies abdominal pain.  Without nausea or vomiting.  The history is provided by the patient and medical records.  Dysuria Fever   Past Medical History:  Diagnosis Date   Acute renal failure 05/31/2014   Angina decubitus 06/15/2014   CAD (coronary artery disease)    a. s/p stent RCA DES 9/09; b. 2014 Attempted PCI of OM1 @ [place];  c. 04/2014 Cath: LAD 40-50p, D1 95-99 (chronic), LCX 30-40 inf branch, OM1 CTO, RCA 30-40p, RCA patent stent, EF 35%->Med Rx.   Chronic combined systolic and diastolic CHF (congestive heart failure) (HCC)    a. EF about 40 to 45% per echo in April 2013;  b. 04/2014 Echo: EF 20-25%, sev LVH, sev glob HK, Gr 1 DD, mildly reduced RV fxn, PASP . c. 01/2017: EF improved to 50-55%.    Chronic kidney disease (CKD), stage III (moderate) (HCC)    Chronic low back pain    Complete heart block (HCC)    COPD (chronic obstructive pulmonary disease) (HCC) dx 06/2013   PFTs 07/08/13: mod obst with resp to bronchodilator, moderate decrease diffusion, airtrapping   COPD with acute exacerbation (HCC) 01/17/2017   COPD with asthma (HCC)    07/08/13 PFT: FEV1 1.74L (66% pred, 30%  change with BD), mod obst with resp to bronchodilator, moderate decrease diffusion, air-trapping 11/2013 Simple spiro>> clear obstruction, FEV1 1.30 L (47% pred) - trial of symbicort  160 2bid 01/26/15      Depression 09/15/2018   Assessment: Increased sadness and depression since losing job  Plan: Patient denies suicidal and homicidal ideations Patient would not like to start medication therapies at this time Will establish patient with [facility] for primary care   Dyslipidemia    a. on statin   Essential hypertension    HLD (hyperlipidemia)    HTN (hypertension)    a. Reports intolerance to hydralazine ; b. no beta blockers 2/2 bradycardia;  c. failed on ACE and ARB.   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 04/2014 Echo: EF 20-25%, sev glob HK.   Mixed Ischemic/Non-Ischemic Cardiomyopathy    probable mixed ischemic and non-ischemic    Noncompliance    OSA (obstructive sleep apnea) 04/18/2018   06/11/16 - home sleep study shows AHI of 2.9 an hour with the lowest SaO2 of 79% with an average of 93%  03/08/2018-Home sleep study-AHI 7/HR, SaO2 low 81%    Paroxysmal atrial fibrillation (HCC)  a. identified on device interrogation 01/2016   Pneumonia 09/02/2013   Pulmonary edema 05/10/2014   Respiratory arrest (HCC) 05/18/2014   Sinus bradycardia 05/31/2014   Ventricular tachycardia (HCC)    treated with ATP,  CL 250-300 msec    Patient Active Problem List   Diagnosis Date Noted   Gastroesophageal reflux disease 07/26/2023   Left shoulder pain 06/12/2023   Non-ST elevation (NSTEMI) myocardial infarction (HCC) 12/15/2022   Paroxysmal A-fib (HCC) 12/15/2022   Status post coronary artery stent placement 12/13/2022   Myocardial infarction sequelae (HCC) 12/13/2022   Chest pain 12/11/2022   Unstable angina (HCC) 12/10/2022    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   Depression 09/15/2018   OSA (obstructive sleep apnea) 04/18/2018   Healthcare maintenance 04/18/2018   Dizziness 02/06/2017   Near syncope 02/06/2017   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   Nocturnal hypoxemia 07/03/2016   Facial abscess 02/04/2016   Dental abscess    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/12/2014   Angina decubitus 06/15/2014   Sinus bradycardia 05/31/2014   Syncope 05/30/2014   Chronic combined systolic and diastolic heart failure (HCC)    Mixed hyperlipidemia    Pulmonary edema 05/10/2014   Essential hypertension 05/08/2014   LBBB (left bundle branch block) 05/08/2014   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   Low back pain 10/05/2013   NSVT (nonsustained ventricular tachycardia) (HCC) 09/04/2013   Flu-like symptoms 09/02/2013   Anemia 09/02/2013   COPD with asthma (HCC)    Nocturia 06/18/2013   LV dysfunction 12/13/2011   Tobacco abuse 10/10/2011   CAD (coronary artery disease) 10/10/2011   Dyslipidemia     Past Surgical History:  Procedure Laterality Date   BI-VENTRICULAR IMPLANTABLE CARDIOVERTER DEFIBRILLATOR N/A 08/12/2014   MDT Fraser Hawthorne XT CRTD implanted by Dr Kelsie   BIV ICD GENERATOR CHANGEOUT N/A 08/08/2021   Procedure: BIV ICD GENERATOR CHANGEOUT;  Surgeon: Kelsie Agent, MD;  Location: [facility] INVASIVE CV LAB;  Service: Cardiovascular;  Laterality: N/A;   CARDIAC CATHETERIZATION     ejection fraction 50%   CORONARY STENT INTERVENTION N/A 12/10/2022   Procedure: CORONARY STENT INTERVENTION;  Surgeon: Swaziland, Peter M, MD;  Location: [facility] INVASIVE CV LAB;  Service: Cardiovascular;  Laterality: N/A;   CORONARY STENT INTERVENTION N/A 12/12/2022   Procedure: CORONARY STENT INTERVENTION;  Surgeon: Swaziland, Peter M, MD;  Location: [facility] INVASIVE CV LAB;  Service: Cardiovascular;  Laterality: N/A;   CORONARY ULTRASOUND/IVUS N/A 12/10/2022   Procedure: Intravascular Ultrasound/IVUS;  Surgeon: Swaziland, Peter  M, MD;  Location: [facility] INVASIVE CV LAB;  Service: Cardiovascular;  Laterality: N/A;   CORONARY ULTRASOUND/IVUS N/A 12/12/2022   Procedure: Coronary Ultrasound/IVUS;  Surgeon: Swaziland, Peter M, MD;  Location: [facility] INVASIVE CV LAB;  Service: Cardiovascular;  Laterality: N/A;   LEFT HEART CATH AND CORONARY ANGIOGRAPHY N/A 12/10/2022   Procedure: LEFT HEART CATH AND CORONARY ANGIOGRAPHY;  Surgeon: Swaziland, Peter M, MD;  Location: [facility] INVASIVE CV LAB;  Service: Cardiovascular;  Laterality: N/A;   LEFT HEART CATHETERIZATION WITH CORONARY ANGIOGRAM N/A 05/08/2014   Procedure: LEFT HEART CATHETERIZATION WITH CORONARY ANGIOGRAM;  Surgeon: Ozell JONETTA Fell, MD;  Location: [facility] CATH LAB;  Service: Cardiovascular;  Laterality: N/A;       Home Medications    Prior to Admission medications   Medication Sig Start Date End Date Taking? Authorizing Provider  amLODipine  (NORVASC ) 10 MG tablet Take 1 tablet (10 mg total) by mouth daily. 10/11/23  Yes [place], Peter M, MD  atorvastatin  (LIPITOR ) 80 MG tablet Take 1 tablet (80 mg  total) by mouth daily. 09/27/23  Yes Gabino Boga, MD  ciprofloxacin  (CIPRO ) 500 MG tablet Take 1 tablet (500 mg total) by mouth every 12 (twelve) hours for 7 days. 06/29/24 07/06/24 Yes Ayme Short  N, FNP  clopidogrel  (PLAVIX ) 75 MG tablet Take 1 tablet (75 mg total) by mouth daily. 10/11/23  Yes [place], Peter M, MD  famotidine  (PEPCID ) 20 MG tablet Take 1 tablet (20 mg total) by mouth every evening. 11/26/23  Yes Jolaine Pac, DO  famotidine  (PEPCID ) 20 MG tablet Take 1 tablet (20 mg total) by mouth every evening. 04/09/24  Yes Jolaine Pac, DO  gabapentin  (NEURONTIN ) 100 MG capsule Take 2 capsules (200 mg total) by mouth at bedtime as needed (for shoulder pain). 07/26/23  Yes Tobie Gaines, DO  losartan  (COZAAR ) 100 MG tablet Take 0.5 tablets (50 mg total) by mouth daily. 09/27/23  Yes Gabino Boga, MD  metoprolol  succinate (TOPROL -XL) 50 MG 24 hr tablet Take 1 tablet (50 mg total)  by mouth daily. 12/13/23  Yes Camnitz, Soyla Lunger, MD  sertraline  (ZOLOFT ) 50 MG tablet Take 1 tablet (50 mg total) by mouth daily. 10/25/23 10/24/24 Yes Jolaine Pac, DO  sertraline  (ZOLOFT ) 50 MG tablet Take 1 tablet (50 mg total) by mouth daily. 04/09/24  Yes Jolaine Pac, DO  torsemide  (DEMADEX ) 20 MG tablet Take 1 tablet (20 mg total) by mouth daily as needed (fluid). 07/26/23  Yes Tobie Gaines, DO  albuterol  (VENTOLIN  HFA) 108 (90 Base) MCG/ACT inhaler Inhale 2 puffs into the lungs every 6 (six) hours as needed for wheezing or shortness of breath. 07/26/23   Tobie Gaines, DO  empagliflozin  (JARDIANCE ) 10 MG TABS tablet Take 1 tablet (10 mg total) by mouth daily before breakfast. 10/11/23   [place], Peter M, MD  fluticasone  furoate-vilanterol (BREO ELLIPTA ) 200-25 MCG/ACT AEPB Inhale 1 puff into the lungs daily. 07/27/23   Tobie Gaines, DO  Fluticasone -Umeclidin-Vilant (TRELEGY ELLIPTA ) 200-62.5-25 MCG/ACT AEPB Inhale 1 Inhalation into the lungs daily. 07/26/23   Tobie Gaines, DO  Fluticasone -Umeclidin-Vilant (TRELEGY ELLIPTA ) 200-62.5-25 MCG/ACT AEPB Inhale 1 puff into the lungs daily at 12 noon. 06/23/24   Jolaine Pac, DO  ipratropium-albuterol  (DUONEB) 0.5-2.5 (3) MG/3ML SOLN Take 3 mLs by nebulization every 6 (six) hours as needed (wheezing or shortness of breath). 07/26/23   Tobie Gaines, DO  pantoprazole  (PROTONIX ) 40 MG tablet Take 1 tablet (40 mg total) by mouth 2 (two) times daily. 10/25/23   Jolaine Pac, DO  umeclidinium bromide  (INCRUSE ELLIPTA ) 62.5 MCG/ACT AEPB Inhale 1 puff into the lungs daily. 07/27/23   Tobie Gaines, DO    Family History Family History  Problem Relation Age of Onset   Heart attack Neg Hx    Stroke Neg Hx     Social History Social History   Tobacco Use   Smoking status: Some Days    Current packs/day: 0.00    Average packs/day: 0.5 packs/day for 56.0 years (28.0 ttl pk-yrs)    Types: Cigarettes    Start date: 09/10/1956    Last attempt to quit:  09/10/2012    Years since quitting: 11.8   Smokeless tobacco: Never   Tobacco comments:    tried 1 month ago (05/2018) but didn't like it   Vaping Use   Vaping status: Never Used  Substance Use Topics   Alcohol  use: No   Drug use: No     Allergies   Acyclovir and related and Aspirin    Review of Systems Review of Systems  Per HPI  Physical Exam  Triage Vital Signs ED Triage Vitals  Encounter Vitals Group     BP 06/29/24 1115 (!) 187/89     Girls Systolic BP Percentile --      Girls Diastolic BP Percentile --      Boys Systolic BP Percentile --      Boys Diastolic BP Percentile --      Pulse Rate 06/29/24 1115 78     Resp 06/29/24 1115 18     Temp 06/29/24 1118 98 F (36.7 C)     Temp src --      SpO2 06/29/24 1115 99 %     Weight --      Height --      Head Circumference --      Peak Flow --      Pain Score 06/29/24 1117 6     Pain Loc --      Pain Education --      Exclude from Growth Chart --    No data found.  Updated Vital Signs BP (!) 187/89 (BP Location: Left Arm)   Pulse 78   Temp 98 F (36.7 C)   Resp 18   SpO2 99%   Visual Acuity Right Eye Distance:   Left Eye Distance:   Bilateral Distance:    Right Eye Near:   Left Eye Near:    Bilateral Near:     Physical Exam Vitals and nursing note reviewed.  Constitutional:      Appearance: Normal appearance.  HENT:     Head: Normocephalic and atraumatic.     Right Ear: External ear normal.     Left Ear: External ear normal.     Nose: Nose normal.     Mouth/Throat:     Mouth: Mucous membranes are moist.  Eyes:     Conjunctiva/sclera: Conjunctivae normal.  Cardiovascular:     Rate and Rhythm: Normal rate.  Pulmonary:     Effort: Pulmonary effort is normal. No respiratory distress.  Abdominal:     Tenderness: There is no right CVA tenderness or left CVA tenderness.  Musculoskeletal:        General: Normal range of motion.  Skin:    General: Skin is warm and dry.  Neurological:      General: No focal deficit present.     Mental Status: He is alert and oriented to person, place, and time.  Psychiatric:        Mood and Affect: Mood normal.        Behavior: Behavior normal. Behavior is cooperative.      UC Treatments / Results  Labs (all labs ordered are listed, but only abnormal results are displayed) Labs Reviewed  POCT URINALYSIS DIP (MANUAL ENTRY) - Abnormal; Notable for the following components:      Result Value   Clarity, UA turbid (*)    Glucose, UA >=1,000 (*)    Blood, UA large (*)    Protein Ur, POC >=300 (*)    Leukocytes, UA Small (1+) (*)    All other components within normal limits  POCT FASTING CBG KUC MANUAL ENTRY - Normal  URINE CULTURE    EKG   Radiology No results found.  Procedures Procedures (including critical care time)  Medications Ordered in UC Medications  cefTRIAXone  (ROCEPHIN ) injection 1 g (1 g Intramuscular Given 06/29/24 1239)    Initial Impression / Assessment and Plan / UC Course  I have reviewed the triage vital signs and the nursing notes.  Pertinent labs &  imaging results that were available during my care of the patient were reviewed by me and considered in my medical decision making (see chart for details).  Vitals and triage reviewed, patient is hemodynamically stable.  Without signs of pyelonephritis, without CVA tenderness, fever or tachycardia.  UA is concerning with large red blood cells, protein and leukocytes.  Will send for culture.  Glucose is over thousand, is on Jardiance  and CBG 90.  GCS 15, patient is alert and oriented.  Does present to clinic alone.  Concern over complicated UTI versus prostatitis, seems to have failed Bactrim , will treat with IM ceftriaxone  and Cipro  for 7 days.  Strict emergency precautions given if symptoms do not improve, evolve or worsen.  Patient verbalized understanding, no questions at this time.     Final Clinical Impressions(s) / UC Diagnoses   Final  diagnoses:  Urinary tract infection with hematuria, site unspecified     Discharge Instructions      I am concerned that you have another urinary tract infection.  We have given you a dose of antibiotics here in clinic.  Take the ciprofloxacin  twice daily with a meal for the next 7 days.  Ensure you are drinking at least 64 ounces of water daily to help flush your kidneys.  Seek immediate care at the nearest emergency department if you do not have improvement in her symptoms over the next 2 to 3 days or if you develop fever, or new concerning symptoms.  Urinary tract infections  can progress to systemic infections, causing sepsis and potentially death.     ED Prescriptions     Medication Sig Dispense Auth. Provider   ciprofloxacin  (CIPRO ) 500 MG tablet Take 1 tablet (500 mg total) by mouth every 12 (twelve) hours for 7 days. 14 tablet Dreama, Quin Mcpherson  N, FNP      PDMP not reviewed this encounter.   Dreama, Juanjose Mojica  N, FNP 06/29/24 1240

## 2024-06-29 NOTE — ED Triage Notes (Signed)
 Patient reports frequent urination

## 2024-06-29 NOTE — Discharge Instructions (Addendum)
 I am concerned that you have another urinary tract infection.  We have given you a dose of antibiotics here in clinic.  Take the ciprofloxacin  twice daily with a meal for the next 7 days.  Ensure you are drinking at least 64 ounces of water daily to help flush your kidneys.  Seek immediate care at the nearest emergency department if you do not have improvement in her symptoms over the next 2 to 3 days or if you develop fever, or new concerning symptoms.  Urinary tract infections  can progress to systemic infections, causing sepsis and potentially death.

## 2024-06-29 NOTE — ED Triage Notes (Signed)
 Patient presents to the office for Dysuria x 3 days. Patient reports low grade fever.

## 2024-07-01 ENCOUNTER — Encounter (HOSPITAL_COMMUNITY): Payer: Self-pay

## 2024-07-01 ENCOUNTER — Emergency Department (HOSPITAL_COMMUNITY): Payer: Self-pay

## 2024-07-01 ENCOUNTER — Ambulatory Visit (HOSPITAL_COMMUNITY): Payer: Self-pay

## 2024-07-01 ENCOUNTER — Observation Stay (HOSPITAL_COMMUNITY)
Admission: EM | Admit: 2024-07-01 | Discharge: 2024-07-02 | Disposition: A | Discharge status: Home / Self Care | Payer: Self-pay

## 2024-07-01 ENCOUNTER — Other Ambulatory Visit: Payer: Self-pay

## 2024-07-01 ENCOUNTER — Ambulatory Visit (HOSPITAL_COMMUNITY)
Admission: EM | Admit: 2024-07-01 | Discharge: 2024-07-01 | Disposition: A | Discharge status: Home / Self Care | Payer: Self-pay

## 2024-07-01 DIAGNOSIS — K219 Gastro-esophageal reflux disease without esophagitis: Secondary | ICD-10-CM | POA: Insufficient documentation

## 2024-07-01 DIAGNOSIS — I2510 Atherosclerotic heart disease of native coronary artery without angina pectoris: Secondary | ICD-10-CM | POA: Insufficient documentation

## 2024-07-01 DIAGNOSIS — E785 Hyperlipidemia, unspecified: Secondary | ICD-10-CM | POA: Insufficient documentation

## 2024-07-01 DIAGNOSIS — R339 Retention of urine, unspecified: Secondary | ICD-10-CM

## 2024-07-01 DIAGNOSIS — J4489 Other specified chronic obstructive pulmonary disease: Secondary | ICD-10-CM | POA: Insufficient documentation

## 2024-07-01 DIAGNOSIS — N390 Urinary tract infection, site not specified: Principal | ICD-10-CM | POA: Insufficient documentation

## 2024-07-01 DIAGNOSIS — G8929 Other chronic pain: Secondary | ICD-10-CM

## 2024-07-01 DIAGNOSIS — Z8679 Personal history of other diseases of the circulatory system: Secondary | ICD-10-CM | POA: Insufficient documentation

## 2024-07-01 DIAGNOSIS — R1031 Right lower quadrant pain: Secondary | ICD-10-CM

## 2024-07-01 DIAGNOSIS — N3001 Acute cystitis with hematuria: Secondary | ICD-10-CM

## 2024-07-01 DIAGNOSIS — I5042 Chronic combined systolic (congestive) and diastolic (congestive) heart failure: Secondary | ICD-10-CM | POA: Insufficient documentation

## 2024-07-01 DIAGNOSIS — N189 Chronic kidney disease, unspecified: Secondary | ICD-10-CM | POA: Insufficient documentation

## 2024-07-01 DIAGNOSIS — I110 Hypertensive heart disease with heart failure: Secondary | ICD-10-CM | POA: Insufficient documentation

## 2024-07-01 DIAGNOSIS — Z95810 Presence of automatic (implantable) cardiac defibrillator: Secondary | ICD-10-CM | POA: Insufficient documentation

## 2024-07-01 DIAGNOSIS — I10 Essential (primary) hypertension: Secondary | ICD-10-CM | POA: Diagnosis present

## 2024-07-01 DIAGNOSIS — Z7982 Long term (current) use of aspirin: Secondary | ICD-10-CM | POA: Insufficient documentation

## 2024-07-01 DIAGNOSIS — Z7901 Long term (current) use of anticoagulants: Secondary | ICD-10-CM | POA: Insufficient documentation

## 2024-07-01 DIAGNOSIS — N179 Acute kidney failure, unspecified: Principal | ICD-10-CM | POA: Diagnosis present

## 2024-07-01 LAB — URINALYSIS, ROUTINE W REFLEX MICROSCOPIC
Bacteria, UA: NONE SEEN
Bilirubin Urine: NEGATIVE
Glucose, UA: >=500 mg/dL — AB
Ketones, ur: 5 mg/dL — AB
Nitrite: NEGATIVE
Protein, ur: 100 mg/dL — AB
RBC / HPF: >50 RBC/hpf (ref 0–5)
Specific Gravity, Urine: 1.016 (ref 1.005–1.030)
WBC, UA: >50 WBC/hpf (ref 0–5)
pH: 5 (ref 5.0–8.0)

## 2024-07-01 LAB — CBC WITH DIFFERENTIAL/PLATELET
Abs Immature Granulocytes: 0.05 K/uL (ref 0.00–0.07)
Basophils Absolute: 0 K/uL (ref 0.0–0.1)
Basophils Relative: 0 %
Eosinophils Absolute: 0 K/uL (ref 0.0–0.5)
Eosinophils Relative: 0 %
HCT: 39.2 % (ref 39.0–52.0)
Hemoglobin: 12.3 g/dL — ABNORMAL LOW (ref 13.0–17.0)
Immature Granulocytes: 1 %
Lymphocytes Relative: 17 %
Lymphs Abs: 1.2 K/uL (ref 0.7–4.0)
MCH: 28.6 pg (ref 26.0–34.0)
MCHC: 31.4 g/dL (ref 30.0–36.0)
MCV: 91.2 fL (ref 80.0–100.0)
Monocytes Absolute: 1.1 K/uL — ABNORMAL HIGH (ref 0.1–1.0)
Monocytes Relative: 16 %
Neutro Abs: 4.7 K/uL (ref 1.7–7.7)
Neutrophils Relative %: 66 %
Platelets: 154 K/uL (ref 150–400)
RBC: 4.3 MIL/uL (ref 4.22–5.81)
RDW: 14.4 % (ref 11.5–15.5)
WBC: 7.1 K/uL (ref 4.0–10.5)
nRBC: 0 % (ref 0.0–0.2)

## 2024-07-01 LAB — POCT URINALYSIS DIP (MANUAL ENTRY)
Glucose, UA: 500 mg/dL — AB
Nitrite, UA: NEGATIVE
Protein Ur, POC: 100 mg/dL — AB
Spec Grav, UA: >=1.03 — AB (ref 1.010–1.025)
Urobilinogen, UA: 0.2 U/dL
pH, UA: 5.5 (ref 5.0–8.0)

## 2024-07-01 LAB — URINE CULTURE: Culture: >=100000 — AB

## 2024-07-01 LAB — COMPREHENSIVE METABOLIC PANEL WITH GFR
ALT: 16 U/L (ref 0–44)
AST: 29 U/L (ref 15–41)
Albumin: 4 g/dL (ref 3.5–5.0)
Alkaline Phosphatase: 75 U/L (ref 38–126)
Anion gap: 14 (ref 5–15)
BUN: 30 mg/dL — ABNORMAL HIGH (ref 8–23)
CO2: 21 mmol/L — ABNORMAL LOW (ref 22–32)
Calcium: 9.5 mg/dL (ref 8.9–10.3)
Chloride: 105 mmol/L (ref 98–111)
Creatinine, Ser: 1.88 mg/dL — ABNORMAL HIGH (ref 0.61–1.24)
GFR, Estimated: 36 mL/min — ABNORMAL LOW (ref >60)
Glucose, Bld: 156 mg/dL — ABNORMAL HIGH (ref 70–99)
Potassium: 4.1 mmol/L (ref 3.5–5.1)
Sodium: 140 mmol/L (ref 135–145)
Total Bilirubin: 0.6 mg/dL (ref 0.0–1.2)
Total Protein: 7.8 g/dL (ref 6.5–8.1)

## 2024-07-01 MED ORDER — SODIUM CHLORIDE 0.9 % IV SOLN: INTRAVENOUS | Status: AC

## 2024-07-01 MED ORDER — IPRATROPIUM-ALBUTEROL 0.5-2.5 (3) MG/3ML IN SOLN: 3.0000 mL | Freq: Four times a day (QID) | RESPIRATORY_TRACT | Status: DC | PRN

## 2024-07-01 MED ORDER — AMLODIPINE BESYLATE 10 MG PO TABS
10.0000 mg | ORAL_TABLET | Freq: Every day | ORAL | Status: DC
  Administered 2024-07-02: 10 mg via ORAL
  Filled 2024-07-01: qty 2

## 2024-07-01 MED ORDER — METOPROLOL SUCCINATE ER 25 MG PO TB24: 50.0000 mg | ORAL_TABLET | Freq: Every day | ORAL | Status: DC

## 2024-07-01 MED ORDER — SODIUM CHLORIDE 0.9 % IV SOLN
2.0000 g | INTRAVENOUS | Status: DC
  Administered 2024-07-02: 2 g via INTRAVENOUS
  Filled 2024-07-01: qty 20

## 2024-07-01 MED ORDER — SODIUM CHLORIDE 0.9 % IV BOLUS
500.0000 mL | Freq: Once | INTRAVENOUS | Status: AC
Start: 2024-07-01 — End: 2024-07-02
  Administered 2024-07-01: 500 mL via INTRAVENOUS

## 2024-07-01 MED ORDER — SERTRALINE HCL 25 MG PO TABS
50.0000 mg | ORAL_TABLET | Freq: Every day | ORAL | Status: DC
  Administered 2024-07-02: 50 mg via ORAL
  Filled 2024-07-01: qty 1

## 2024-07-01 MED ORDER — ATORVASTATIN CALCIUM 80 MG PO TABS
80.0000 mg | ORAL_TABLET | Freq: Every day | ORAL | Status: DC
  Administered 2024-07-02: 80 mg via ORAL
  Filled 2024-07-01: qty 1

## 2024-07-01 MED ORDER — FAMOTIDINE 20 MG PO TABS
20.0000 mg | ORAL_TABLET | Freq: Every evening | ORAL | Status: DC
  Administered 2024-07-01: 20 mg via ORAL
  Filled 2024-07-01: qty 1

## 2024-07-01 MED ORDER — METOPROLOL SUCCINATE ER 50 MG PO TB24
75.0000 mg | ORAL_TABLET | Freq: Every day | ORAL | Status: DC
  Administered 2024-07-02: 75 mg via ORAL
  Filled 2024-07-01: qty 3

## 2024-07-01 MED ORDER — BUDESON-GLYCOPYRROL-FORMOTEROL 160-9-4.8 MCG/ACT IN AERO
2.0000 | INHALATION_SPRAY | Freq: Two times a day (BID) | RESPIRATORY_TRACT | Status: DC
  Administered 2024-07-02 (×2): 2 via RESPIRATORY_TRACT
  Filled 2024-07-01: qty 5.9

## 2024-07-01 MED ORDER — ENOXAPARIN SODIUM 30 MG/0.3ML IJ SOSY: 30.0000 mg | PREFILLED_SYRINGE | INTRAMUSCULAR | Status: DC

## 2024-07-01 MED ORDER — CLOPIDOGREL BISULFATE 75 MG PO TABS
75.0000 mg | ORAL_TABLET | Freq: Every day | ORAL | Status: DC
  Administered 2024-07-02: 75 mg via ORAL
  Filled 2024-07-01: qty 1

## 2024-07-01 MED ORDER — CEFTRIAXONE SODIUM 1 G IJ SOLR
1.0000 g | Freq: Once | INTRAMUSCULAR | Status: AC
  Administered 2024-07-01: 1 g via INTRAVENOUS
  Filled 2024-07-01: qty 10

## 2024-07-01 NOTE — ED Triage Notes (Signed)
 Patient is here for follow-up on Dysuria, reports he is not better. Patient states he is having chills and headache.

## 2024-07-01 NOTE — ED Provider Triage Note (Signed)
 Emergency Medicine Provider Triage Evaluation Note  [name] , a 83 y.o. male  was evaluated in triage.  Pt complains of urinary retention, dysuria, hematuria and flank pain.  Recently diagnosed with UTI and started on Cipro  but states this is not helping.  Has been able to void when he pushes on his bladder  Review of Systems  Positive: As above Negative: As above  Physical Exam  BP (!) 148/78   Pulse 92   Temp 97.8 F (36.6 C)   Resp (!) 21   Ht 5' 8 (1.727 m)   Wt 72.6 kg   SpO2 98%   BMI 24.33 kg/m  Gen:   Awake, no distress   Resp:  Normal effort  MSK:   Moves extremities without difficulty  Other:    Medical Decision Making  Medically screening exam initiated at 5:06 PM.  Appropriate orders placed.  [name] was informed that the remainder of the evaluation will be completed by another provider, this initial triage assessment does not replace that evaluation, and the importance of remaining in the ED until their evaluation is complete.     Hildegard Loge, PA-C 07/01/24 [phone]

## 2024-07-01 NOTE — ED Provider Notes (Signed)
 [place] EMERGENCY DEPARTMENT AT [facility] Provider Note   CSN: [id number] Arrival date & time: 07/01/24  1555     Patient presents with: UTI, Flank Pain, and Abdominal Pain   [name] is a 83 y.o. male.   HPI 83 year old male presents with dysuria.  Symptoms originally started 5 days ago.  He said dysuria and hematuria and lower abdominal pain.  Started to have a little bit of low back discomfort.  He thinks he has had a subjective fever over the last 24 hours.  He was seen at urgent care 2 days ago and put on Cipro  and given an IM injection.  Despite compliance, he is feeling worse and is having decreased p.o. intake, lack of appetite, and feeling weaker.  Went to urgent care and was sent here.  He has been having some difficulty emptying his bladder.  Prior to Admission medications   Medication Sig Start Date End Date Taking? Authorizing Provider  albuterol  (VENTOLIN  HFA) 108 (90 Base) MCG/ACT inhaler Inhale 2 puffs into the lungs every 6 (six) hours as needed for wheezing or shortness of breath. 07/26/23   Tobie Gaines, DO  amLODipine  (NORVASC ) 10 MG tablet Take 1 tablet (10 mg total) by mouth daily. 10/11/23   [place], Peter M, MD  atorvastatin  (LIPITOR ) 80 MG tablet Take 1 tablet (80 mg total) by mouth daily. 09/27/23   Gabino Boga, MD  ciprofloxacin  (CIPRO ) 500 MG tablet Take 1 tablet (500 mg total) by mouth every 12 (twelve) hours for 7 days. 06/29/24 07/06/24  Dreama, Georgia  N, FNP  clopidogrel  (PLAVIX ) 75 MG tablet Take 1 tablet (75 mg total) by mouth daily. 10/11/23   [place], Peter M, MD  empagliflozin  (JARDIANCE ) 10 MG TABS tablet Take 1 tablet (10 mg total) by mouth daily before breakfast. 10/11/23   [place], Peter M, MD  famotidine  (PEPCID ) 20 MG tablet Take 1 tablet (20 mg total) by mouth every evening. 11/26/23   Jolaine Pac, DO  famotidine  (PEPCID ) 20 MG tablet Take 1 tablet (20 mg total) by mouth every evening. 04/09/24   Jolaine Pac, DO   fluticasone  furoate-vilanterol (BREO ELLIPTA ) 200-25 MCG/ACT AEPB Inhale 1 puff into the lungs daily. 07/27/23   Tobie Gaines, DO  Fluticasone -Umeclidin-Vilant (TRELEGY ELLIPTA ) 200-62.5-25 MCG/ACT AEPB Inhale 1 Inhalation into the lungs daily. 07/26/23   Tobie Gaines, DO  Fluticasone -Umeclidin-Vilant (TRELEGY ELLIPTA ) 200-62.5-25 MCG/ACT AEPB Inhale 1 puff into the lungs daily at 12 noon. 06/23/24   Jolaine Pac, DO  gabapentin  (NEURONTIN ) 100 MG capsule Take 2 capsules (200 mg total) by mouth at bedtime as needed (for shoulder pain). 07/26/23   Tobie Gaines, DO  ipratropium-albuterol  (DUONEB) 0.5-2.5 (3) MG/3ML SOLN Take 3 mLs by nebulization every 6 (six) hours as needed (wheezing or shortness of breath). 07/26/23   Tobie Gaines, DO  losartan  (COZAAR ) 100 MG tablet Take 0.5 tablets (50 mg total) by mouth daily. 09/27/23   Gabino Boga, MD  metoprolol  succinate (TOPROL -XL) 50 MG 24 hr tablet Take 1 tablet (50 mg total) by mouth daily. 12/13/23   Camnitz, Soyla Lunger, MD  pantoprazole  (PROTONIX ) 40 MG tablet Take 1 tablet (40 mg total) by mouth 2 (two) times daily. 10/25/23   Jolaine Pac, DO  sertraline  (ZOLOFT ) 50 MG tablet Take 1 tablet (50 mg total) by mouth daily. 10/25/23 10/24/24  Jolaine Pac, DO  sertraline  (ZOLOFT ) 50 MG tablet Take 1 tablet (50 mg total) by mouth daily. 04/09/24   Jolaine Pac, DO  torsemide  (DEMADEX ) 20 MG  tablet Take 1 tablet (20 mg total) by mouth daily as needed (fluid). 07/26/23   Tobie Gaines, DO  umeclidinium bromide  (INCRUSE ELLIPTA ) 62.5 MCG/ACT AEPB Inhale 1 puff into the lungs daily. 07/27/23   Tobie Gaines, DO    Allergies: Acyclovir and related and Aspirin     Review of Systems  Constitutional:  Positive for appetite change and fever (subjective).  Respiratory:  Negative for cough.   Gastrointestinal:  Positive for abdominal pain. Negative for vomiting.  Genitourinary:  Positive for dysuria and hematuria. Negative for flank pain.    Updated Vital  Signs BP (!) 160/78 (BP Location: Left Arm)   Pulse 73   Temp 98.1 F (36.7 C)   Resp 12   Ht 5' 8 (1.727 m)   Wt 72.6 kg   SpO2 99%   BMI 24.33 kg/m   Physical Exam Vitals and nursing note reviewed.  Constitutional:      General: He is not in acute distress.    Appearance: He is well-developed. He is not ill-appearing or diaphoretic.  HENT:     Head: Normocephalic and atraumatic.  Cardiovascular:     Rate and Rhythm: Normal rate and regular rhythm.     Heart sounds: Normal heart sounds.  Pulmonary:     Effort: Pulmonary effort is normal.     Breath sounds: Normal breath sounds.  Abdominal:     Palpations: Abdomen is soft.     Tenderness: There is abdominal tenderness in the suprapubic area. There is no right CVA tenderness or left CVA tenderness.  Skin:    General: Skin is warm and dry.  Neurological:     Mental Status: He is alert.     (all labs ordered are listed, but only abnormal results are displayed) Labs Reviewed  CBC WITH DIFFERENTIAL/PLATELET - Abnormal; Notable for the following components:      Result Value   Hemoglobin 12.3 (*)    Monocytes Absolute 1.1 (*)    All other components within normal limits  COMPREHENSIVE METABOLIC PANEL WITH GFR - Abnormal; Notable for the following components:   CO2 21 (*)    Glucose, Bld 156 (*)    BUN 30 (*)    Creatinine, Ser 1.88 (*)    GFR, Estimated 36 (*)    All other components within normal limits  URINALYSIS, ROUTINE W REFLEX MICROSCOPIC - Abnormal; Notable for the following components:   Color, Urine AMBER (*)    APPearance CLOUDY (*)    Glucose, UA >=500 (*)    Hgb urine dipstick SMALL (*)    Ketones, ur 5 (*)    Protein, ur 100 (*)    Leukocytes,Ua LARGE (*)    All other components within normal limits  URINE CULTURE    EKG: None  Radiology: CT Renal Stone Study Result Date: 07/01/2024 CLINICAL DATA:  Abdominal/flank pain, stone suspected EXAM: CT ABDOMEN AND PELVIS WITHOUT CONTRAST TECHNIQUE:  Multidetector CT imaging of the abdomen and pelvis was performed following the standard protocol without IV contrast. RADIATION DOSE REDUCTION: This exam was performed according to the departmental dose-optimization program which includes automated exposure control, adjustment of the mA and/or kV according to patient size and/or use of iterative reconstruction technique. COMPARISON:  December 06, 2022 FINDINGS: Of note, the lack of intravenous contrast limits evaluation of the solid organ parenchyma and vascularity. Lower chest: No focal airspace consolidation or pleural effusion. Cardiomegaly. Hepatobiliary: No mass.No radiopaque stones or wall thickening of the gallbladder. No intrahepatic or extrahepatic biliary ductal  dilation. Pancreas: Diffuse fatty atrophy of the pancreatic parenchyma. No mass or ductal dilation. No peripancreatic inflammation or fluid collection. Spleen: Normal size. No mass. Adrenals/Urinary Tract: No adrenal masses. Unchanged small cyst in the interpolar region of the right kidney. No hydronephrosis or nephrolithiasis. Circumferential wall thickening of the urinary bladder. Stomach/Bowel: The stomach is decompressed without focal abnormality. No small bowel wall thickening or inflammation. No small bowel obstruction.Normal appendix. 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 Diffuse aortoiliac atherosclerosis. No intraabdominal or pelvic lymphadenopathy. Reproductive: Mild prostatomegaly.  No free pelvic fluid. Other: No pneumoperitoneum, ascites, or mesenteric inflammation. Musculoskeletal: No acute fracture or destructive lesion. IMPRESSION: 1. Circumferential wall thickening of the urinary bladder, worrisome for acute cystitis. Correlation with urinalysis is recommended. 2. 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 3. Mild prostatomegaly. Aortic Atherosclerosis (ICD10-I70.0). 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 Electronically Signed   By: Rogelia Myers M.D.   On: 07/01/2024 18:16     Procedures   Medications Ordered in the ED  cefTRIAXone  (ROCEPHIN ) 1 g in sodium chloride  0.9 % 100 mL IVPB (has no administration in time range)  sodium chloride  0.9 % bolus 500 mL (has no administration in time range)                                    Medical Decision Making Amount and/or Complexity of Data Reviewed Labs: ordered.    Details: Acute kidney injury Radiology: independent interpretation performed.    Details: No ureteral stone  Risk Decision regarding hospitalization.   Patient presents with UTI and an acute kidney injury.  Kidney injury may be more from just poor p.o. intake.  Was given IV fluids and will start on IV Rocephin .  He had 250 cc in his bladder, will get a repeat after he urinates but may need a Foley if not completely emptying his bladder.  He is otherwise not septic or ill-appearing.  CTA from triage is unremarkable.  Discussed with the internal medicine teaching service, who will admit.     Final diagnoses:  Acute kidney injury  Acute urinary tract infection    ED Discharge Orders     None          Freddi Hamilton, MD 07/01/24 2246

## 2024-07-01 NOTE — ED Triage Notes (Signed)
 Pt came in via POV d/t last 2 days taking ABT from UC for Tx of UTI & urinary retention. Pt reports burning with urination, bil flank pain & lower abd pain. Reports he has to press on his bell to empty his bladder, has loss of appetite & some blood in his urine & sometimes his urine is brown. A/Ox4, rates his pain 9/10 during triage.

## 2024-07-01 NOTE — Discharge Instructions (Signed)
 We need to go to the ER for further evaluation.  You have been unable to urinate in 3 days, you have been feeling poorly, subjective fever, and symptoms are not responding to antibiotic treatment.

## 2024-07-01 NOTE — ED Provider Notes (Addendum)
 [facility]    CSN: [id number] Arrival date & time: 07/01/24  1356      History   Chief Complaint Chief Complaint  Patient presents with   Urinary Retention   Fever    HPI [name] is a 83 y.o. male.   Patient returns after being seen 2 days ago stating that he is not improving with use of Cipro .  Patient states that he has been unable to empty his bladder and has been feeling fatigued, feverish, and as if he could pass out.  Urine culture obtained at last visit shows that Cipro  should be sensitive to the E. coli causing his urinary symptoms.   Fever   Past Medical History:  Diagnosis Date   Acute renal failure 05/31/2014   Angina decubitus 06/15/2014   CAD (coronary artery disease)    a. s/p stent RCA DES 9/09; b. 2014 Attempted PCI of OM1 @ [place];  c. 04/2014 Cath: LAD 40-50p, D1 95-99 (chronic), LCX 30-40 inf branch, OM1 CTO, RCA 30-40p, RCA patent stent, EF 35%->Med Rx.   Chronic combined systolic and diastolic CHF (congestive heart failure) (HCC)    a. EF about 40 to 45% per echo in April 2013;  b. 04/2014 Echo: EF 20-25%, sev LVH, sev glob HK, Gr 1 DD, mildly reduced RV fxn, PASP . c. 01/2017: EF improved to 50-55%.    Chronic kidney disease (CKD), stage III (moderate) (HCC)    Chronic low back pain    Complete heart block (HCC)    COPD (chronic obstructive pulmonary disease) (HCC) dx 06/2013   PFTs 07/08/13: mod obst with resp to bronchodilator, moderate decrease diffusion, airtrapping   COPD with acute exacerbation (HCC) 01/17/2017   COPD with asthma (HCC)    07/08/13 PFT: FEV1 1.74L (66% pred, 30% change with BD), mod obst with resp to bronchodilator, moderate decrease diffusion, air-trapping 11/2013 Simple spiro>> clear obstruction, FEV1 1.30 L (47% pred) - trial of symbicort  160 2bid 01/26/15      Depression 09/15/2018   Assessment: Increased sadness and depression since losing job  Plan: Patient denies suicidal and homicidal ideations  Patient would not like to start medication therapies at this time Will establish patient with [facility] for primary care   Dyslipidemia    a. on statin   Essential hypertension    HLD (hyperlipidemia)    HTN (hypertension)    a. Reports intolerance to hydralazine ; b. no beta blockers 2/2 bradycardia;  c. failed on ACE and ARB.   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 04/2014 Echo: EF 20-25%, sev glob HK.   Mixed Ischemic/Non-Ischemic Cardiomyopathy    probable mixed ischemic and non-ischemic    Noncompliance    OSA (obstructive sleep apnea) 04/18/2018   06/11/16 - home sleep study shows AHI of 2.9 an hour with the lowest SaO2 of 79% with an average of 93%  03/08/2018-Home sleep study-AHI 7/HR, SaO2 low 81%    Paroxysmal atrial fibrillation (HCC)    a. identified on device interrogation 01/2016   Pneumonia 09/02/2013   Pulmonary edema 05/10/2014   Respiratory arrest (HCC) 05/18/2014   Sinus bradycardia 05/31/2014   Ventricular tachycardia (HCC)    treated with ATP,  CL 250-300 msec    Patient Active Problem List   Diagnosis Date Noted   Gastroesophageal reflux disease  07/26/2023   Left shoulder pain 06/12/2023   Non-ST elevation (NSTEMI) myocardial infarction (HCC) 12/15/2022   Paroxysmal A-fib (HCC) 12/15/2022   Status post coronary artery stent placement 12/13/2022   Myocardial infarction sequelae (HCC) 12/13/2022   Chest pain 12/11/2022   Unstable angina (HCC) 12/10/2022   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   Depression 09/15/2018   OSA (obstructive sleep apnea) 04/18/2018   Healthcare maintenance 04/18/2018   Dizziness 02/06/2017   Near syncope 02/06/2017   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   Nocturnal hypoxemia 07/03/2016   Facial abscess 02/04/2016   Dental  abscess    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/12/2014   Angina decubitus 06/15/2014   Sinus bradycardia 05/31/2014   Syncope 05/30/2014   Chronic combined systolic and diastolic heart failure (HCC)    Mixed hyperlipidemia    Pulmonary edema 05/10/2014   Essential hypertension 05/08/2014   LBBB (left bundle branch block) 05/08/2014   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   Low back pain 10/05/2013   NSVT (nonsustained ventricular tachycardia) (HCC) 09/04/2013   Flu-like symptoms 09/02/2013   Anemia 09/02/2013   COPD with asthma (HCC)    Nocturia 06/18/2013   LV dysfunction 12/13/2011   Tobacco abuse 10/10/2011   CAD (coronary artery disease) 10/10/2011   Dyslipidemia     Past Surgical History:  Procedure Laterality Date   BI-VENTRICULAR IMPLANTABLE CARDIOVERTER DEFIBRILLATOR N/A 08/12/2014   MDT Fraser Hawthorne XT CRTD implanted by Dr Kelsie   BIV ICD GENERATOR CHANGEOUT N/A 08/08/2021   Procedure: BIV ICD GENERATOR CHANGEOUT;  Surgeon: Kelsie Agent, MD;  Location: [facility] INVASIVE CV LAB;  Service: Cardiovascular;  Laterality: N/A;   CARDIAC CATHETERIZATION     ejection fraction 50%   CORONARY STENT INTERVENTION N/A 12/10/2022   Procedure: CORONARY STENT INTERVENTION;  Surgeon: Swaziland, Peter M, MD;  Location: [facility] INVASIVE CV LAB;  Service: Cardiovascular;  Laterality: N/A;   CORONARY STENT INTERVENTION N/A 12/12/2022   Procedure: CORONARY STENT INTERVENTION;  Surgeon: Swazil[place], Peter M, MD;  Location: [facility] INVASIVE CV LAB;  Service: Cardiovascular;  Laterality: N/A;   CORONARY ULTRASOUND/IVUS N/A 12/10/2022   Procedure: Intravascular Ultrasound/IVUS;  Surgeon: Swaziland, Peter M, MD;  Location: [facility] INVASIVE CV LAB;  Service: Cardiovascular;  Laterality: N/A;   CORONARY ULTRASOUND/IVUS N/A 12/12/2022   Procedure: Coronary Ultrasound/IVUS;  Surgeon: [place]azil[place], Peter M, MD;  Location: [facility] INVASIVE CV LAB;  Service: Cardiovascular;  Laterality: N/A;   LEFT HEART CATH AND CORONARY ANGIOGRAPHY N/A 12/10/2022   Procedure: LEFT  HEART CATH AND CORONARY ANGIOGRAPHY;  Surgeon: [place]azil[place], Peter M, MD;  Location: [facility] INVASIVE CV LAB;  Service: Cardiovascular;  Laterality: N/A;   LEFT HEART CATHETERIZATION WITH CORONARY ANGIOGRAM N/A 05/08/2014   Procedure: LEFT HEART CATHETERIZATION WITH CORONARY ANGIOGRAM;  Surgeon: Ozell JONETTA Fell, MD;  Location: [facility] CATH LAB;  Service: Cardiovascular;  Laterality: N/A;       Home Medications    Prior to Admission medications   Medication Sig Start Date End Date Taking? Authorizing Provider  albuterol  (VENTOLIN  HFA) 108 (90 Base) MCG/ACT inhaler Inhale 2 puffs into the lungs every 6 (six) hours as needed for wheezing or shortness of breath. 07/26/23  Yes Tobie Gaines, DO  amLODipine  (NORVASC ) 10 MG tablet Take 1 tablet (10 mg total) by mouth daily. 10/11/23  Yes [place], Peter M, MD  atorvastatin  (LIPITOR ) 80 MG tablet Take 1 tablet (80 mg total) by mouth daily. 09/27/23  Yes Gabino Boga, MD  ciprofloxacin  (CIPRO ) 500 MG tablet Take 1 tablet (500 mg total) by mouth every  12 (twelve) hours for 7 days. 06/29/24 07/06/24 Yes Garrison, [place]  N, FNP  clopidogrel  (PLAVIX ) 75 MG tablet Take 1 tablet (75 mg total) by mouth daily. 10/11/23  Yes [place], Peter M, MD  empagliflozin  (JARDIANCE ) 10 MG TABS tablet Take 1 tablet (10 mg total) by mouth daily before breakfast. 10/11/23  Yes [place], Peter M, MD  famotidine  (PEPCID ) 20 MG tablet Take 1 tablet (20 mg total) by mouth every evening. 11/26/23  Yes Jolaine Pac, DO  famotidine  (PEPCID ) 20 MG tablet Take 1 tablet (20 mg total) by mouth every evening. 04/09/24  Yes Jolaine Pac, DO  fluticasone  furoate-vilanterol (BREO ELLIPTA ) 200-25 MCG/ACT AEPB Inhale 1 puff into the lungs daily. 07/27/23  Yes Tobie Gaines, DO  gabapentin  (NEURONTIN ) 100 MG capsule Take 2 capsules (200 mg total) by mouth at bedtime as needed (for shoulder pain). 07/26/23  Yes Tobie Gaines, DO  ipratropium-albuterol  (DUONEB) 0.5-2.5 (3) MG/3ML SOLN Take 3 mLs by nebulization  every 6 (six) hours as needed (wheezing or shortness of breath). 07/26/23  Yes Tobie Gaines, DO  losartan  (COZAAR ) 100 MG tablet Take 0.5 tablets (50 mg total) by mouth daily. 09/27/23  Yes Gabino Boga, MD  metoprolol  succinate (TOPROL -XL) 50 MG 24 hr tablet Take 1 tablet (50 mg total) by mouth daily. 12/13/23  Yes Camnitz, Soyla Lunger, MD  pantoprazole  (PROTONIX ) 40 MG tablet Take 1 tablet (40 mg total) by mouth 2 (two) times daily. 10/25/23  Yes Jolaine Pac, DO  sertraline  (ZOLOFT ) 50 MG tablet Take 1 tablet (50 mg total) by mouth daily. 10/25/23 10/24/24 Yes Jolaine Pac, DO  torsemide  (DEMADEX ) 20 MG tablet Take 1 tablet (20 mg total) by mouth daily as needed (fluid). 07/26/23  Yes Tobie Gaines, DO  umeclidinium bromide  (INCRUSE ELLIPTA ) 62.5 MCG/ACT AEPB Inhale 1 puff into the lungs daily. 07/27/23  Yes Tobie Gaines, DO  Fluticasone -Umeclidin-Vilant (TRELEGY ELLIPTA ) 200-62.5-25 MCG/ACT AEPB Inhale 1 Inhalation into the lungs daily. 07/26/23   Tobie Gaines, DO  Fluticasone -Umeclidin-Vilant (TRELEGY ELLIPTA ) 200-62.5-25 MCG/ACT AEPB Inhale 1 puff into the lungs daily at 12 noon. 06/23/24   Jolaine Pac, DO  sertraline  (ZOLOFT ) 50 MG tablet Take 1 tablet (50 mg total) by mouth daily. 04/09/24   Jolaine Pac, DO    Family History Family History  Problem Relation Age of Onset   Heart attack Neg Hx    Stroke Neg Hx     Social History Social History   Tobacco Use   Smoking status: Some Days    Current packs/day: 0.00    Average packs/day: 0.5 packs/day for 56.0 years (28.0 ttl pk-yrs)    Types: Cigarettes    Start date: 09/10/1956    Last attempt to quit: 09/10/2012    Years since quitting: 11.8   Smokeless tobacco: Never   Tobacco comments:    tried 1 month ago (05/2018) but didn't like it   Vaping Use   Vaping status: Never Used  Substance Use Topics   Alcohol  use: No   Drug use: No     Allergies   Acyclovir and related and Aspirin    Review of Systems Review of  Systems  Constitutional:  Positive for fever.     Physical Exam Triage Vital Signs ED Triage Vitals  Encounter Vitals Group     BP 07/01/24 1532 (!) 152/75     Girls Systolic BP Percentile --      Girls Diastolic BP Percentile --      Boys Systolic BP Percentile --  Boys Diastolic BP Percentile --      Pulse Rate 07/01/24 1532 62     Resp 07/01/24 1532 18     Temp 07/01/24 1532 (!) 97.2 F (36.2 C)     Temp Source 07/01/24 1532 Oral     SpO2 07/01/24 1532 98 %     Weight --      Height --      Head Circumference --      Peak Flow --      Pain Score 07/01/24 1536 8     Pain Loc --      Pain Education --      Exclude from Growth Chart --    No data found.  Updated Vital Signs BP (!) 152/75 (BP Location: Left Arm)   Pulse 62   Temp (!) 97.2 F (36.2 C) (Oral)   Resp 18   SpO2 98%   Visual Acuity Right Eye Distance:   Left Eye Distance:   Bilateral Distance:    Right Eye Near:   Left Eye Near:    Bilateral Near:     Physical Exam Vitals and nursing note reviewed.  Constitutional:      General: He is not in acute distress.    Appearance: Normal appearance. He is not ill-appearing, toxic-appearing or diaphoretic.  Eyes:     General: No scleral icterus. Cardiovascular:     Rate and Rhythm: Normal rate and regular rhythm.     Heart sounds: Normal heart sounds.  Pulmonary:     Effort: Pulmonary effort is normal. No respiratory distress.     Breath sounds: Normal breath sounds. No wheezing or rhonchi.  Abdominal:     General: Abdomen is flat. Bowel sounds are normal.     Palpations: Abdomen is soft.     Tenderness: There is abdominal tenderness in the right upper quadrant, right lower quadrant, suprapubic area, left upper quadrant and left lower quadrant. There is right CVA tenderness and left CVA tenderness.  Skin:    General: Skin is warm.  Neurological:     Mental Status: He is alert and oriented to person, place, and time.  Psychiatric:        Mood  and Affect: Mood normal.        Behavior: Behavior normal.      UC Treatments / Results  Labs (all labs ordered are listed, but only abnormal results are displayed) Labs Reviewed  POCT URINALYSIS DIP (MANUAL ENTRY) - Abnormal; Notable for the following components:      Result Value   Clarity, UA cloudy (*)    Glucose, UA =500 (*)    Bilirubin, UA small (*)    Ketones, POC UA small (15) (*)    Spec Grav, UA >=1.030 (*)    Blood, UA large (*)    Protein Ur, POC =100 (*)    Leukocytes, UA Moderate (2+) (*)    All other components within normal limits    EKG   Radiology No results found.  Procedures Procedures (including critical care time)  Medications Ordered in UC Medications - No data to display  Initial Impression / Assessment and Plan / UC Course  I have reviewed the triage vital signs and the nursing notes.  Pertinent labs & imaging results that were available during my care of the patient were reviewed by me and considered in my medical decision making (see chart for details).     Patient was instructed to report to the ER for further  evaluation as he is not improving with treatments provided by urgent care 2 days ago.  Concerned that patient may need catheterization and IV antibiotics.  Final Clinical Impressions(s) / UC Diagnoses   Final diagnoses:  Urinary retention  Abdominal pain, chronic, right lower quadrant     Discharge Instructions      We need to go to the ER for further evaluation.  You have been unable to urinate in 3 days, you have been feeling poorly, subjective fever, and symptoms are not responding to antibiotic treatment.    ED Prescriptions   None    PDMP not reviewed this encounter.   Andra Corean BROCKS, PA-C 07/01/24 1550    Andra Corean BROCKS, PA-C 07/01/24 1551

## 2024-07-01 NOTE — H&P (Signed)
 Date: 07/01/2024               Patient Name:  [name] MRN: [id number]  DOB: 12/08/1940 Age / Sex: 83 y.o., male   PCP: Jolaine Pac, DO         Medical Service: Internal Medicine Teaching Service         Attending Physician: Dr. Shawn Sick, MD      First Contact: Dr. Darra Morrison, MD    Second Contact: Dr. Damien Lease, DO         After Hours (After 5p/  First Contact Pager: [phone]  weekends / holidays): Second Contact Pager: [phone]   SUBJECTIVE   Chief Complaint: Dysuria  History of Present Illness:   This is a 83 year old male with past medical history of hyperlipidemia, hypertension, COPD, CAD (last cath April 2024 with stenting to proximal and mid RCA), HFrEF (last echocardiogram November 2024 EF 45 to 50%) who presents with concerns of 5-day history of dysuria and hematuria.  He states this all started on Friday, June 26, 2024.  He states he noticed a burning sensation with urination and some blood tinged urine.  He decided to present to urgent care on Monday 10/20 to get this evaluated and he was found to have acute cystitis and treated with ciprofloxacin .  He states he started taking his ciprofloxacin , but he has not improved, and therefore came back to urgent care and he was sent to the emergency department for further evaluation and management.  He denies any fevers or chills.  He denies any sexual activity.  He denies any discharge.  He reports having urinary frequency and retention.  He endorsed having back pain, but is no longer having back pain.  He reports he did not take any of his morning medications besides the ciprofloxacin  today.  He denies any history of prostate cancer.  He denies any history of kidney stones.  ED Course: Patient initially presented to the emergency department with vital signs showing temperature 97.8 F, pulse 92, blood pressure 148/70 satting at 98% on room air.  Labs showed elevated creatinine at 1.88  with UA showing  evidence of leukocytes.  Patient had CT renal stone study showing circumferential bladder wall thickening worrisome for acute cystitis.  With concern for cystitis and failed outpatient treatment, IMTS was consulted for admission.  Past Medical History Past Medical History:  Diagnosis Date   Acute renal failure 05/31/2014   Angina decubitus 06/15/2014   CAD (coronary artery disease)    a. s/p stent RCA DES 9/09; b. 2014 Attempted PCI of OM1 @ [place];  c. 04/2014 Cath: LAD 40-50p, D1 95-99 (chronic), LCX 30-40 inf branch, OM1 CTO, RCA 30-40p, RCA patent stent, EF 35%->Med Rx.   Chronic combined systolic and diastolic CHF (congestive heart failure) (HCC)    a. EF about 40 to 45% per echo in April 2013;  b. 04/2014 Echo: EF 20-25%, sev LVH, sev glob HK, Gr 1 DD, mildly reduced RV fxn, PASP . c. 01/2017: EF improved to 50-55%.    Chronic kidney disease (CKD), stage III (moderate) (HCC)    Chronic low back pain    Complete heart block (HCC)    COPD (chronic obstructive pulmonary disease) (HCC) dx 06/2013   PFTs 07/08/13: mod obst with resp to bronchodilator, moderate decrease diffusion, airtrapping   COPD with acute exacerbation (HCC) 01/17/2017   COPD with asthma (HCC)    07/08/13 PFT: FEV1 1.74L (66% pred, 30% change with BD), mod  obst with resp to bronchodilator, moderate decrease diffusion, air-trapping 11/2013 Simple spiro>> clear obstruction, FEV1 1.30 L (47% pred) - trial of symbicort  160 2bid 01/26/15      Depression 09/15/2018   Assessment: Increased sadness and depression since losing job  Plan: Patient denies suicidal and homicidal ideations Patient would not like to start medication therapies at this time Will establish patient with [facility] for primary care   Dyslipidemia    a. on statin   Essential hypertension    HLD (hyperlipidemia)    HTN (hypertension)    a. Reports intolerance to hydralazine ; b. no beta blockers 2/2 bradycardia;  c. failed on ACE and  ARB.   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 04/2014 Echo: EF 20-25%, sev glob HK.   Mixed Ischemic/Non-Ischemic Cardiomyopathy    probable mixed ischemic and non-ischemic    Noncompliance    OSA (obstructive sleep apnea) 04/18/2018   06/11/16 - home sleep study shows AHI of 2.9 an hour with the lowest SaO2 of 79% with an average of 93%  03/08/2018-Home sleep study-AHI 7/HR, SaO2 low 81%    Paroxysmal atrial fibrillation (HCC)    a. identified on device interrogation 01/2016   Pneumonia 09/02/2013   Pulmonary edema 05/10/2014   Respiratory arrest (HCC) 05/18/2014   Sinus bradycardia 05/31/2014   Ventricular tachycardia (HCC)    treated with ATP,  CL 250-300 msec     Meds:  Current Meds  Medication Sig   acetaminophen  (TYLENOL ) 650 MG CR tablet Take 650 mg by mouth daily as needed (for shoulder pain).   amLODipine  (NORVASC ) 10 MG tablet Take 1 tablet (10 mg total) by mouth daily.   atorvastatin  (LIPITOR ) 80 MG tablet Take 1 tablet (80 mg total) by mouth daily.   clopidogrel  (PLAVIX ) 75 MG tablet Take 1 tablet (75 mg total) by mouth daily.   Ensure (ENSURE) Take 237 mLs by mouth in the morning.   fluticasone  furoate-vilanterol (BREO ELLIPTA ) 200-25 MCG/ACT AEPB Inhale 1 puff into the lungs daily.   Fluticasone -Umeclidin-Vilant (TRELEGY ELLIPTA ) 200-62.5-25 MCG/ACT AEPB Inhale 1 Inhalation into the lungs daily. (Patient taking differently: Inhale 1 Inhalation into the lungs in the morning.)   Glycopyrrolate -Formoterol  (BEVESPI  AEROSPHERE) 9-4.8 MCG/ACT AERO Inhale 1 puff into the lungs daily as needed (for shortness of breath).   ipratropium-albuterol  (DUONEB) 0.5-2.5 (3) MG/3ML SOLN Take 3 mLs by nebulization every 6 (six) hours as needed (wheezing or shortness of breath).   losartan  (COZAAR ) 100 MG tablet Take 0.5 tablets (50 mg total)  by mouth daily.   metoprolol  succinate (TOPROL -XL) 50 MG 24 hr tablet Take 1 tablet (50 mg total) by mouth daily. (Patient taking differently: Take 75 mg by mouth daily.)   sertraline  (ZOLOFT ) 50 MG tablet Take 1 tablet (50 mg total) by mouth daily.   torsemide  (DEMADEX ) 20 MG tablet Take 1 tablet (20 mg total) by mouth daily as needed (fluid).   umeclidinium bromide  (INCRUSE ELLIPTA ) 62.5 MCG/ACT AEPB Inhale 1 puff into the lungs daily.    Past Surgical History  Past Surgical History:  Procedure Laterality Date   BI-VENTRICULAR IMPLANTABLE CARDIOVERTER DEFIBRILLATOR N/A 08/12/2014   MDT Fraser Hawthorne XT CRTD implanted by Dr Kelsie   BIV ICD GENERATOR CHANGEOUT N/A 08/08/2021   Procedure: BIV ICD GENERATOR CHANGEOUT;  Surgeon: Kelsie Agent, MD;  Location: [facility] INVASIVE CV  LAB;  Service: Cardiovascular;  Laterality: N/A;   CARDIAC CATHETERIZATION     ejection fraction 50%   CORONARY STENT INTERVENTION N/A 12/10/2022   Procedure: CORONARY STENT INTERVENTION;  Surgeon: Swaziland, Peter M, MD;  Location: [facility] INVASIVE CV LAB;  Service: Cardiovascular;  Laterality: N/A;   CORONARY STENT INTERVENTION N/A 12/12/2022   Procedure: CORONARY STENT INTERVENTION;  Surgeon: Swaziland, Peter M, MD;  Location: [facility] INVASIVE CV LAB;  Service: Cardiovascular;  Laterality: N/A;   CORONARY ULTRASOUND/IVUS N/A 12/10/2022   Procedure: Intravascular Ultrasound/IVUS;  Surgeon: Swaziland, Peter M, MD;  Location: [facility] INVASIVE CV LAB;  Service: Cardiovascular;  Laterality: N/A;   CORONARY ULTRASOUND/IVUS N/A 12/12/2022   Procedure: Coronary Ultrasound/IVUS;  Surgeon: Swaziland, Peter M, MD;  Location: [facility] INVASIVE CV LAB;  Service: Cardiovascular;  Laterality: N/A;   LEFT HEART CATH AND CORONARY ANGIOGRAPHY N/A 12/10/2022   Procedure: LEFT HEART CATH AND CORONARY ANGIOGRAPHY;  Surgeon: Swaziland, Peter M, MD;  Location: [facility] INVASIVE CV LAB;  Service: Cardiovascular;  Laterality: N/A;   LEFT HEART CATHETERIZATION WITH CORONARY ANGIOGRAM N/A 05/08/2014    Procedure: LEFT HEART CATHETERIZATION WITH CORONARY ANGIOGRAM;  Surgeon: Ozell JONETTA Fell, MD;  Location: [facility] CATH LAB;  Service: Cardiovascular;  Laterality: N/A;    Social:  Lives With: Grandson at home, [place] Occupation: Security guard Support: Good support in his grandson Level of Function: Independent in all ADLs and IADLs PCP: Dr. Fairy Pool  Family History: No pertinent family history  Allergies: Allergies as of 07/01/2024 - Review Complete 07/01/2024  Allergen Reaction Noted   Acyclovir and related Other (See Comments) 08/12/2014   Aspirin  Other (See Comments) 12/13/2011    Review of Systems: A complete ROS was negative except as per HPI.   OBJECTIVE:   Physical Exam: Blood pressure (!) 125/107, pulse 85, temperature 98.1 F (36.7 C), resp. rate 12, height 5' 8 (1.727 m), weight 72.6 kg, SpO2 99%.  Constitutional: well-appearing, resting in bed, no acute distress HENT: normocephalic atraumatic Cardiovascular: regular rate and rhythm, no m/r/g Pulmonary/Chest: Expiratory wheezing appreciated throughout all lung fields Abdominal: soft, with some suprapubic tenderness on exam  Back: No CVA tenderness MSK: normal bulk and tone Skin: warm and dry  Labs: CBC    Component Value Date/Time   WBC 7.1 07/01/2024 1706   RBC 4.30 07/01/2024 1706   HGB 12.3 (L) 07/01/2024 1706   HGB 10.6 (L) 06/07/2023 0956   HCT 39.2 07/01/2024 1706   HCT 34.3 (L) 06/07/2023 0956   PLT 154 07/01/2024 1706   PLT 142 (L) 06/07/2023 0956   MCV 91.2 07/01/2024 1706   MCV 89 06/07/2023 0956   MCH 28.6 07/01/2024 1706   MCHC 31.4 07/01/2024 1706   RDW 14.4 07/01/2024 1706   RDW 13.4 06/07/2023 0956   LYMPHSABS 1.2 07/01/2024 1706   LYMPHSABS 2.3 06/07/2023 0956   MONOABS 1.1 (H) 07/01/2024 1706   EOSABS 0.0 07/01/2024 1706   EOSABS 0.1 06/07/2023 0956   BASOSABS 0.0 07/01/2024 1706   BASOSABS 0.0 06/07/2023 0956     CMP     Component Value Date/Time   NA 140 07/01/2024 1706    NA 144 10/11/2023 1039   K 4.1 07/01/2024 1706   CL 105 07/01/2024 1706   CO2 21 (L) 07/01/2024 1706   GLUCOSE 156 (H) 07/01/2024 1706   BUN 30 (H) 07/01/2024 1706   BUN 15 10/11/2023 1039   CREATININE 1.88 (H) 07/01/2024 1706   CREATININE 0.83 04/17/2016 0330   CALCIUM  9.5 07/01/2024 1706  PROT 7.8 07/01/2024 1706   PROT 7.1 10/11/2023 1039   ALBUMIN 4.0 07/01/2024 1706   ALBUMIN 4.7 10/11/2023 1039   AST 29 07/01/2024 1706   ALT 16 07/01/2024 1706   ALKPHOS 75 07/01/2024 1706   BILITOT 0.6 07/01/2024 1706   BILITOT 0.7 10/11/2023 1039   GFRNONAA 36 (L) 07/01/2024 1706   GFRNONAA 88 04/17/2016 0330   GFRAA 90 01/12/2020 1539   GFRAA >89 04/17/2016 0330    Imaging:  CT renal stone study: IMPRESSION: 1. Circumferential wall thickening of the urinary bladder, worrisome for acute cystitis. Correlation with urinalysis is recommended. 2. 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 3. Mild prostatomegaly.   ASSESSMENT & PLAN:   Assessment & Plan by Problem: Principal Problem:   Urinary tract infection Active Problems:   Dyslipidemia   Essential hypertension   COPD with asthma (HCC)   CAD (coronary artery disease)   Chronic combined systolic and diastolic heart failure (HCC)   AKI (acute kidney injury)   Gastroesophageal reflux disease   [name] is a 83 y.o. person living with a history of CAD, HFrEF, hypertension who presents for concerns of dysuria and hematuria.  Patient found to have acute cystitis and admitted for further evaluation and management.  #Acute cystitis Patient was UA findings and clinical symptoms consistent with acute simple cystitis.  Patient did have back pain, but on my exam, no evidence of CVA tenderness or back pain, less likely pyelonephritis.  Patient is not febrile.  Normal white count.  UA from 10/20 growing E. coli which is  susceptible to ciprofloxacin .  Given failed outpatient management, will start IV ceftriaxone . - Follow-up urine culture - Start ceftriaxone  day 1 of 5 - Monitor fever curve - Can narrow antibiotics based on culture results - Hold home jardiance    #AKI Patient presents today with creatinine elevated at 1.88.  GFR at 36.  Baseline creatinine around 1.1-1.2.  Patient did endorse poor appetite.  Patient is a heart patient, so we will give gentle fluids.  Patient already bolused with 500 cc in the ED - Monitor BMP - Gentle normal saline fluid infusion overnight at 50 cc/h for 10 hours - Hold Losartan  and Jardiance    #COPD Patient has a past medical history of COPD.  Home medications include Trelegy.  On my exam today, he had expiratory wheezing.  No cough or sputum production.  Patient does not seem to have a COPD exacerbation at this time.  Will continue home inhalers.  Will also add on DuoNebs for wheezing. - Monitor respiratory status - As needed DuoNebs - Start home Trelegy (hospital formulary is Clinical cytogeneticist)  #CAD #Combined systolic and diastolic heart failure #History of NSVT status post ICD placement Patient with past medical history of CAD as well as combined systolic and diastolic heart failure.  Patient had a left heart cath with intervention in April 2024 with drug-eluting stent to proximal and mid RCA.  Patient also had an echo in November 2024 showing ejection fraction of 45 to 50% with left ventricular global hypokinesis.  Patient does not look grossly volume overloaded on my exam today.  He last saw his heart doctor on October 11, 2023.  At that time he started on Jardiance .  At that visit, he was recommended to continue chronic DAPT, but he is only on Plavix  monotherapy given that he has aspirin  intolerance.  He saw his EP  in March 2025, no concerns. - Continue Plavix  75 mg daily - Hold home Jardiance  in the setting of AKI and urinary tract infection - Continue home metoprolol  75 mg  daily - Hold home Losartan  50 mg daily  - Hold home torsemide  20 mg PRN  #Hypertension Patient has a past medical history of hypertension.  Home medications include amlodipine  10 mg daily, losartan  50 mg daily, metoprolol  succinate 75 mg daily.  Given AKI, will hold home losartan  50 mg daily. - Monitor blood pressure closely - Hold home losartan  50 mg daily - Resume home amlodipine  10 mg daily - Resume home metoprolol  succinate 75 mg daily  #Hyperlipidemia Patient has a past medical history of hyperlipidemia.  Most recent lipid panel showing total cholesterol 168, LDL 88 in January 2025.  LDL is not at goal. -Continue home atorvastatin  80 mg daily -Will need to continue to titrate lipid medicine as outpatient  #GERD Patient with past medical history of GERD.  No acute concerns today. - Continue home Pepcid  20 mg daily  Diet: Normal VTE: Enoxaparin  IVF: NS,50cc/hr Code: Full  Prior to Admission Living Arrangement: Home, living with Grandson Anticipated Discharge Location: Home Barriers to Discharge: Clinical improvement  Dispo: Admit patient to Observation with expected length of stay less than 2 midnights.  Signed: Tobie Gaines, DO Internal Medicine Resident PGY-3  Please page on call intern or resident: First contact: [phone] If no answer in 15 minutes, please contact senior pager at [phone]

## 2024-07-01 NOTE — ED Notes (Signed)
 Assuming pt care, pt walk in for burning with voiding and hematuria x 5 days, pt reports med hx. HTN, DM, defibrillator to left chest. Pt denies pain/discomfort at this time. Call bell within reach.

## 2024-07-01 NOTE — Hospital Course (Addendum)
 Urinating with blood, burning. Since Friday. Lately having urinary frequency. Sometimes gets the urge but can't go. Othertimes has urinary incontinence. Fevers, 99 on Monday. Sexually active 6 months ago and had intercourse. Denies penile discharge but endorses brown urine. Denied vomiting, leg swelling . No hx of prostate cancer or kidney stone  Back pain and taking 650 mg tylenol     Ciprofloxacin  twice daily for 7 days. Drinking lots of fluid including cranberry juice. This was on 10/20. Last took this morning  Trelegy-  copd    Work: security guard  Level of function:  laundry, cleaning bathrooms , can't mow the lawn PCP:  Tobacco: Alcohol  Other drugs:   Physical exam: suprapubic pain  No flank pain   Full code

## 2024-07-01 NOTE — Progress Notes (Signed)
 Pharmacy Medication Assistance Program Note    07/01/2024  Patient ID: [name], male  DOB: 11-08-1940, 83 y.o.  MRN:  [id number]     06/09/2024  Outreach Medication Two  Manufacturer Medication Two [employer] ([employer]  [employer] ([employer]) Drugs Trelegy  Type of Assistance Manufacturer Assistance  Date Application Sent to Patient 06/09/2024  Application Items Requested Application;Proof of Income  Date Application Received From Patient 07/01/2024  Application Items Received From Patient Application  Method Application Sent to Manufacturer Fax  Date Application Submitted to Manufacturer 07/01/2024     Re-enrollment  Submitted application to [employer]

## 2024-07-02 ENCOUNTER — Other Ambulatory Visit (HOSPITAL_COMMUNITY): Payer: Self-pay

## 2024-07-02 DIAGNOSIS — N3000 Acute cystitis without hematuria: Secondary | ICD-10-CM

## 2024-07-02 DIAGNOSIS — N410 Acute prostatitis: Secondary | ICD-10-CM

## 2024-07-02 LAB — URINALYSIS, W/ REFLEX TO CULTURE (INFECTION SUSPECTED)
Bacteria, UA: NONE SEEN
Bilirubin Urine: NEGATIVE
Glucose, UA: 150 mg/dL — AB
Ketones, ur: NEGATIVE mg/dL
Nitrite: NEGATIVE
Protein, ur: 100 mg/dL — AB
RBC / HPF: >50 RBC/hpf (ref 0–5)
Specific Gravity, Urine: 1.018 (ref 1.005–1.030)
WBC, UA: >50 WBC/hpf (ref 0–5)
pH: 5 (ref 5.0–8.0)

## 2024-07-02 LAB — BASIC METABOLIC PANEL WITH GFR
Anion gap: 12 (ref 5–15)
Anion gap: 13 (ref 5–15)
BUN: 26 mg/dL — ABNORMAL HIGH (ref 8–23)
BUN: 23 mg/dL (ref 8–23)
CO2: 21 mmol/L — ABNORMAL LOW (ref 22–32)
CO2: 22 mmol/L (ref 22–32)
Calcium: 9.1 mg/dL (ref 8.9–10.3)
Calcium: 9.1 mg/dL (ref 8.9–10.3)
Chloride: 108 mmol/L (ref 98–111)
Chloride: 105 mmol/L (ref 98–111)
Creatinine, Ser: 1.61 mg/dL — ABNORMAL HIGH (ref 0.61–1.24)
Creatinine, Ser: 1.67 mg/dL — ABNORMAL HIGH (ref 0.61–1.24)
GFR, Estimated: 43 mL/min — ABNORMAL LOW (ref >60)
GFR, Estimated: 41 mL/min — ABNORMAL LOW (ref >60)
Glucose, Bld: 118 mg/dL — ABNORMAL HIGH (ref 70–99)
Glucose, Bld: 132 mg/dL — ABNORMAL HIGH (ref 70–99)
Potassium: 3.8 mmol/L (ref 3.5–5.1)
Potassium: 3.8 mmol/L (ref 3.5–5.1)
Sodium: 141 mmol/L (ref 135–145)
Sodium: 140 mmol/L (ref 135–145)

## 2024-07-02 LAB — URINE CULTURE: Culture: NO GROWTH

## 2024-07-02 LAB — CBC
HCT: 35.6 % — ABNORMAL LOW (ref 39.0–52.0)
Hemoglobin: 11.3 g/dL — ABNORMAL LOW (ref 13.0–17.0)
MCH: 28.3 pg (ref 26.0–34.0)
MCHC: 31.7 g/dL (ref 30.0–36.0)
MCV: 89.2 fL (ref 80.0–100.0)
Platelets: 148 K/uL — ABNORMAL LOW (ref 150–400)
RBC: 3.99 MIL/uL — ABNORMAL LOW (ref 4.22–5.81)
RDW: 14.1 % (ref 11.5–15.5)
WBC: 7.6 K/uL (ref 4.0–10.5)
nRBC: 0 % (ref 0.0–0.2)

## 2024-07-02 MED ORDER — LIDOCAINE HCL URETHRAL/MUCOSAL 2 % EX GEL
1.0000 | Freq: Once | CUTANEOUS | Status: AC
  Administered 2024-07-02: 1 via URETHRAL
  Filled 2024-07-02: qty 11

## 2024-07-02 MED ORDER — METOPROLOL SUCCINATE ER 25 MG PO TB24: 75.0000 mg | ORAL_TABLET | Freq: Every day | ORAL | Status: DC

## 2024-07-02 MED ORDER — FAMOTIDINE 20 MG PO TABS
10.0000 mg | ORAL_TABLET | Freq: Every day | ORAL | 0 refills | Status: AC
  Filled 2024-07-02: qty 15, 30d supply, fill #0

## 2024-07-02 MED ORDER — CEFADROXIL 500 MG PO CAPS
1000.0000 mg | ORAL_CAPSULE | Freq: Two times a day (BID) | ORAL | 0 refills | Status: AC
  Filled 2024-07-02: qty 112, 28d supply, fill #0

## 2024-07-02 MED ORDER — ENOXAPARIN SODIUM 40 MG/0.4ML IJ SOSY
40.0000 mg | PREFILLED_SYRINGE | INTRAMUSCULAR | Status: DC
  Administered 2024-07-02: 40 mg via SUBCUTANEOUS
  Filled 2024-07-02: qty 0.4

## 2024-07-02 MED ORDER — BOOST PLUS PO LIQD
237.0000 mL | Freq: Three times a day (TID) | ORAL | Status: DC
  Administered 2024-07-02: 237 mL via ORAL
  Filled 2024-07-02 (×4): qty 237

## 2024-07-02 MED ORDER — TAMSULOSIN HCL 0.4 MG PO CAPS
0.4000 mg | ORAL_CAPSULE | Freq: Once | ORAL | Status: AC
  Administered 2024-07-02: 0.4 mg via ORAL
  Filled 2024-07-02: qty 1

## 2024-07-02 MED ORDER — ASPIRIN 81 MG PO TBEC
81.0000 mg | DELAYED_RELEASE_TABLET | Freq: Every day | ORAL | 0 refills | Status: AC
  Filled 2024-07-02: qty 30, 30d supply, fill #0

## 2024-07-02 MED ORDER — CEFPODOXIME PROXETIL 200 MG PO TABS
400.0000 mg | ORAL_TABLET | Freq: Two times a day (BID) | ORAL | 0 refills | Status: DC
  Filled 2024-07-02: qty 28, 7d supply, fill #0

## 2024-07-02 NOTE — ED Notes (Signed)
 Inh med requested to pharm by private msg

## 2024-07-02 NOTE — Plan of Care (Signed)

## 2024-07-02 NOTE — Discharge Summary (Signed)
 Name: [name] MRN: [id number] DOB: [birth year] 83 y.o. PCP: Vincent Pac, DO  Date of Admission: 07/01/2024  4:13 PM Date of Discharge: 07/02/2024 Attending Physician: Dr. Jone Brewer  Discharge Diagnosis: 1. Principal Problem:   Urinary tract infection Active Problems:   Dyslipidemia   Essential hypertension   COPD with asthma (HCC)   CAD (coronary artery disease)   Chronic combined systolic and diastolic heart failure (HCC)   AKI (acute kidney injury)   Gastroesophageal reflux disease   Discharge Medications: Allergies as of 07/02/2024       Reactions   Ciprofloxacin     Has infrarenal aneurysm    Acyclovir And Related Other (See Comments)   Pt states he is not familiar with this medication.   Aspirin  Other (See Comments)   GI upset at high doses only.        Medication List     PAUSE taking these medications    losartan  100 MG tablet Wait to take this until your doctor or other care provider tells you to start again. Commonly known as: COZAAR  Take 0.5 tablets (50 mg total) by mouth daily.   torsemide  20 MG tablet Wait to take this until your doctor or other care provider tells you to start again. Commonly known as: DEMADEX  Take 1 tablet (20 mg total) by mouth daily as needed (fluid).       STOP taking these medications    Bevespi  Aerosphere 9-4.8 MCG/ACT Aero Generic drug: Glycopyrrolate -Formoterol    fluticasone  furoate-vilanterol 200-25 MCG/ACT Aepb Commonly known as: BREO ELLIPTA    gabapentin  100 MG capsule Commonly known as: NEURONTIN    Jardiance  10 MG Tabs tablet Generic drug: empagliflozin    umeclidinium bromide  62.5 MCG/ACT Aepb Commonly known as: INCRUSE ELLIPTA        TAKE these medications    acetaminophen  650 MG CR tablet Commonly known as: TYLENOL  Take 650 mg by mouth daily as needed (for shoulder pain).   albuterol  108 (90 Base) MCG/ACT inhaler Commonly known as: VENTOLIN  HFA Inhale 2 puffs into the lungs every  6 (six) hours as needed for wheezing or shortness of breath.   amLODipine  10 MG tablet Commonly known as: NORVASC  Take 1 tablet (10 mg total) by mouth daily.   aspirin  EC 81 MG tablet Take 1 tablet (81 mg total) by mouth daily. Swallow whole.   atorvastatin  80 MG tablet Commonly known as: LIPITOR  Take 1 tablet (80 mg total) by mouth daily.   cefadroxil 500 MG capsule Commonly known as: DURICEF Take 2 capsules (1,000 mg total) by mouth 2 (two) times daily for 28 days.   clopidogrel  75 MG tablet Commonly known as: PLAVIX  Take 1 tablet (75 mg total) by mouth daily.   Ensure Take 237 mLs by mouth in the morning.   famotidine  20 MG tablet Commonly known as: PEPCID  Take 0.5 tablets (10 mg total) by mouth daily. What changed:  how much to take when to take this Another medication with the same name was removed. Continue taking this medication, and follow the directions you see here.   ipratropium-albuterol  0.5-2.5 (3) MG/3ML Soln Commonly known as: DUONEB Take 3 mLs by nebulization every 6 (six) hours as needed (wheezing or shortness of breath).   metoprolol  succinate 25 MG 24 hr tablet Commonly known as: TOPROL -XL Take 3 tablets (75 mg total) by mouth daily. What changed:  medication strength how much to take   sertraline  50 MG tablet Commonly known as: ZOLOFT  Take 1 tablet (50 mg total) by mouth daily.  What changed: Another medication with the same name was removed. Continue taking this medication, and follow the directions you see here.   Trelegy Ellipta  200-62.5-25 MCG/ACT Aepb Generic drug: Fluticasone -Umeclidin-Vilant Inhale 1 Inhalation into the lungs daily. What changed: when to take this        Disposition and follow-up:   [name] was discharged from [facility] in Good condition.  At the hospital follow up visit please address:  1.  Acute bacterial prostatitis/UTI  -Recurrent E. Coli. UTIs in the setting of a tender prostate  on DRE  -Discharged on Cefpodoxime 400mg  BID for 28 days  -Please assess antibiotic adherence  -Please ensure follow-up with the urology resident clinic at 3:00 PM on 10/30 to discuss continued antibiotics if necessary.  -Please follow-up on G/C testing, unable to obtain Trichomonas testing, please test if indicated.  -Please follow-up urinary culture and sensitivities  2. 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 Received one dose while hospitalized. He does have bilateral cataracts.  -Please check BMP at hospital follow-up  -Please restart Losartan  as appropriate  3. CAD/HFrEF  -Confirmed with cardiology that he should be on DAPT, he had previously been on Plavix  alone  -Ensure the patient is taking Aspirin  and Plavix .  -Unclear what dose of Metoprolol  the patient should be taking. Seems to be taking 75mg  BID. He was bradycardic during admission and his dose was reduced to 75mg  once daily. Assess need to increase dose back to 75mg  BID.  4. GERD  -Home dose Pepcid  20 mg daily  -Reduced to 10mg  daily given creatinine clearance  -Consider increasing to 20mg  if creatinine clearance improved.     5.  Labs / imaging needed at time of follow-up: BMP  6.  Pending labs/ test needing follow-up: G/C probe  Follow-up Appointments:  Follow-up Information     [facility] Follow up on 07/09/2024.   Why: go at 3:00 PM to this urology appointment with the resident clinic Contact information: [address] [place] [address] [place]  [address] [phone]        Vincent Braun, DO Follow up on 07/07/2024.   Specialty: Internal Medicine Why: At 10:45 for primary care Contact information: [address] [place]  [address] [place] [address] [phone]                  Hospital Course by problem list: [name] is a 83 y.o. person living with a history of  CAD, COPD, HFrEF, hypertension who presented with UTI and admitted for IV antibiotics now being discharged with the following pertinent hospital course:  #Acute bacterial prostatitis #Acute cystitis Patient with UA findings and clinical symptoms consistent with acute simple cystitis, along with urinary retention and overflow incontinence. Symptoms were not improved on oral Ciprofloxacin . His urine cultures have been positive for E. Coli on three separate occasions. No systemic symptoms. CT showed prostatomegaly. He received Ceftriaxone  while admitted, along with Lidocaine  jelly for symptomatic control. His DRE showed a tender prostate that was enlarged. He did have some retention that resolved with one dose of Flomax. Urine culture obtained after DRE. Referred to urology for concern for bacterial prostatitis.   #AKI Patient presents today with creatinine elevated at  1.88.  GFR at 36. AKI improved to 1.67 on the day of discharge. Likely post-renal due to BPH. His Losartan  and Jardiance  were held. Jardiance  was discontinued indefinable due to recurrent UTIs.   #COPD Patient has a past medical history of COPD.  Without exacerbation. Managed with home inhalers.     #CAD #Combined systolic and diastolic heart failure #History of NSVT status post ICD placement Patient with past medical history of CAD as well as combined systolic and diastolic heart failure.  Patient had a left heart cath with intervention in April 2024 with drug-eluting stent to proximal and mid RCA.  Patient also had an echo in November 2024 showing ejection fraction of 45 to 50% with left ventricular global hypokinesis.  Patient does not look grossly volume overloaded on my exam today.  He last saw his heart doctor on October 11, 2023.  At that time he started on Jardiance .  At  that visit, he was recommended to continue chronic DAPT, but the patient had not been taking this. We contacted his cardiologist and confirmed that he should be on DAPT.   #Hypertension Patient has a past medical history of hypertension.  Home medications include amlodipine  10 mg daily, losartan  50 mg daily, metoprolol  succinate 75 mg BID.  Losartan  held for AKI. Received Metoprolol  75mg  daily and Amlodipine  10mg  daily. HR low overnight so recommend to decrease Metoprolol  to 75mg  once daily.   #Hyperlipidemia Patient has a past medical history of hyperlipidemia.  Most recent lipid panel showing total cholesterol 168, LDL 88 in January 2025.  LDL is not at goal. Continue home atorvastatin  80 mg daily  #GERD Patient with past medical history of GERD.  No acute concerns today. Home Pepcid  reduced to 10mg  given creatinine clearance.  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 Should continue to avoid Floroquinolones in this patient. No further follow up recommended.     Discharge Subjective Feeling better this morning after administration of Lidocaine  jelly. Sometimes feels that he can not empty his bladder fully. He has had recurrent infections that all feel similar to this. He would like to go home today.  Discharge Exam:   BP 137/66 (BP Location: Right Arm)   Pulse (!) 59   Temp 98.9 F (37.2 C)   Resp 19   Ht 5' 8 (1.727 m)   Wt 72.6 kg   SpO2 98%   BMI 24.34 kg/m  Discharge exam:   Physical Exam Constitutional:      General: He is not in acute distress.    Appearance: He is not ill-appearing.  Cardiovascular:     Rate and Rhythm: Normal rate and regular rhythm.  Pulmonary:     Effort: No respiratory distress.     Breath sounds: No wheezing or rales.  Abdominal:     General: Abdomen is flat. There is no distension. There are no signs of injury.     Tenderness: There is abdominal tenderness in the suprapubic area.  Genitourinary:    Prostate: Enlarged and tender.     Rectum:  No tenderness.  Neurological:     Mental Status: He is alert.      Pertinent Labs, Studies, and Procedures:     Latest Ref Rng & Units 07/02/2024    2:02 AM 07/01/2024    5:06 PM 05/10/2024    6:07 AM  CBC  WBC 4.0 - 10.5 K/uL 7.6  7.1  10.4   Hemoglobin 13.0 - 17.0 g/dL 88.6  87.6  87.9   Hematocrit  39.0 - 52.0 % 35.6  39.2  38.3   Platelets 150 - 400 K/uL 148  154  194        Latest Ref Rng & Units 07/02/2024    2:05 PM 07/02/2024    2:02 AM 07/01/2024    5:06 PM  CMP  Glucose 70 - 99 mg/dL 867  881  843   BUN 8 - 23 mg/dL 23  26  30    Creatinine 0.61 - 1.24 mg/dL 8.32  8.38  8.11   Sodium 135 - 145 mmol/L 140  141  140   Potassium 3.5 - 5.1 mmol/L 3.8  3.8  4.1   Chloride 98 - 111 mmol/L 105  108  105   CO2 22 - 32 mmol/L 22  21  21    Calcium  8.9 - 10.3 mg/dL 9.1  9.1  9.5   Total Protein 6.5 - 8.1 g/dL   7.8   Total Bilirubin 0.0 - 1.2 mg/dL   0.6   Alkaline Phos 38 - 126 U/L   75   AST 15 - 41 U/L   29   ALT 0 - 44 U/L   16     CT Renal Stone Study Result Date: 07/01/2024 CLINICAL DATA:  Abdominal/flank pain, stone suspected EXAM: CT ABDOMEN AND PELVIS WITHOUT CONTRAST TECHNIQUE: Multidetector CT imaging of the abdomen and pelvis was performed following the standard protocol without IV contrast. RADIATION DOSE REDUCTION: This exam was performed according to the departmental dose-optimization program which includes automated exposure control, adjustment of the mA and/or kV according to patient size and/or use of iterative reconstruction technique. COMPARISON:  December 06, 2022 FINDINGS: Of note, the lack of intravenous contrast limits evaluation of the solid organ parenchyma and vascularity. Lower chest: No focal airspace consolidation or pleural effusion. Cardiomegaly. Hepatobiliary: No mass.No radiopaque stones or wall thickening of the gallbladder. No intrahepatic or extrahepatic biliary ductal dilation. Pancreas: Diffuse fatty atrophy of the pancreatic parenchyma. No  mass or ductal dilation. No peripancreatic inflammation or fluid collection. Spleen: Normal size. No mass. Adrenals/Urinary Tract: No adrenal masses. Unchanged small cyst in the interpolar region of the right kidney. No hydronephrosis or nephrolithiasis. Circumferential wall thickening of the urinary bladder. Stomach/Bowel: The stomach is decompressed without focal abnormality. No small bowel wall thickening or inflammation. No small bowel obstruction.Normal appendix. 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 Diffuse aortoiliac atherosclerosis. No intraabdominal or pelvic lymphadenopathy. Reproductive: Mild prostatomegaly.  No free pelvic fluid. Other: No pneumoperitoneum, ascites, or mesenteric inflammation. Musculoskeletal: No acute fracture or destructive lesion. IMPRESSION: 1. Circumferential wall thickening of the urinary bladder, worrisome for acute cystitis. Correlation with urinalysis is recommended. 2. 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 3. Mild prostatomegaly. Aortic Atherosclerosis (ICD10-I70.0). 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 Electronically Signed   By: Rogelia Myers M.D.   On: 07/01/2024 18:16     Discharge Instructions: Discharge Instructions     Call MD for:  difficulty breathing, headache or visual disturbances   Complete by: As directed    Call MD for:  persistant nausea and vomiting   Complete by: As directed    Call MD for:  temperature >100.4   Complete by: As directed    Diet - low sodium heart healthy   Complete by: As directed  Discharge instructions   Complete by: As directed    You were hospitalized for acute bacterial prostatitis and urinary tract infection, we treated you with antibiotics. Thank you for  allowing us  to be part of your care.   We arranged for you to follow up at: [facility] on 10/30 at 3:00 PM  Please follow up at the [facility] clinic on 10/28 at 10:45 AM  Please note these changes made to your medications:   *Please START taking:  Cefpodoxime 400mg  (2 tablets) twice daily for 28 days Aspirin  81mg  once daily Metoprolol  75mg  once daily  *Please STOP taking:  Jardiance  Losartan . Wait until your doctor says that it is safe to take this medicine  Please reduce your Pepcid  dose to 10mg  once daily until your kidney function improves.  Please call our clinic if you have any questions or concerns, we may be able to help and keep you from a long and expensive emergency room wait. Our clinic and after hours phone number is [phone], the best time to call is Monday through Friday 9 am to 4 pm but there is always someone available 24/7 if you have an emergency. If you need medication refills please notify your pharmacy one week in advance and they will send us  a request.   Increase activity slowly   Complete by: As directed       You were hospitalized for acute bacterial prostatitis and urinary tract infection, we treated you with antibiotics. Thank you for allowing us  to be part of your care.   We arranged for you to follow up at: [facility] on 10/30 at 3:00 PM  Please follow up at the [facility] clinic on 10/28 at 10:45 AM  Please note these changes made to your medications:   *Please START taking:  Cefpodoxime 400mg  (2 tablets) twice daily for 28 days Aspirin  81mg  once daily Metoprolol  75mg  once daily  *Please STOP taking:  Jardiance  Losartan . Wait until your doctor says that it is safe to take this medicine  Please reduce your Pepcid  dose to 10mg  once daily until your kidney function improves.  Please call our clinic if you have any questions or concerns, we may be able to help and keep you from a long and expensive emergency room wait. Our clinic and after  hours phone number is [phone], the best time to call is Monday through Friday 9 am to 4 pm but there is always someone available 24/7 if you have an emergency. If you need medication refills please notify your pharmacy one week in advance and they will send us  a request.   Signed: Napoleon Limes, MD 07/02/2024, 7:03 PM

## 2024-07-02 NOTE — Progress Notes (Addendum)
 MATCH Medication Assistance Card *Pharmacies please call [phone] for claim processing assistance Name: [name]                                                                                                                                                                                 Relationship Code:  1 ID (MRN): [id number]                                                                                                                                                                               Person Code:  01 Bin: [id number] RX Group: [id number] Discharge Date: 07/02/24                          RX PCN:  [id number] Expiration Date:07/09/24                                           (must be filled within 7 days of discharge)     You have been approved to have the prescriptions written by your discharging physician filled through our [facility] (Medication Assistance Through [facility]) program. This program allows for a one-time (no refills) 34-day supply of selected medications for a low copay amount.  The copay is $0 per prescription.   Only certain pharmacies are participating in this program with [facility]. You will need to select one of the pharmacies from the attached list and take your prescriptions, this letter, and your photo ID to one of  the [facility] pharmacies.  We are excited that you are able to use the [facility] program to get your medications. These prescriptions must be filled within 7 days of hospital discharge or they will no longer be valid for the [facility]  program. Should you have any problems with your prescriptions please contact your case management team member at [phone] for [place]/ [facility].  Thank you,   [facility]

## 2024-07-02 NOTE — Progress Notes (Addendum)
 Transition of Care [facility]) - Inpatient Brief Assessment   Patient Details  Name: [name] MRN: [id number] Date of Birth: 25-Jul-1941  Transition of Care [facility]) CM/SW Contact:    Rosaline JONELLE Joe, RN Phone Number: 07/02/2024, 4:49 PM   Clinical Narrative: CM met with the patient at the bedside after speaking with Delores, [facility] with UR.  Patient has no listed insurance and UR was unable to obtain his Medicare/Medicaid information.  I spoke with the patient and he confirmed that he had Medicare part A and Medicaid.  I called Admitted office and was unable to reach anyone by phone to assist.  Patient states that he is independent and drove to the hospital.  DME includes crutches at home but does not use.  PCP - Dr. Jolaine.  Patient plans to be discharged home today and plans to drive home.  Patient states that he lives with his grandson as well.  Pharmacy was unable to pull the patient's available medicare and Medicaid.  MATCH was provided.  TOC pharmacy will provide medications for home.   Transition of Care Asessment: Insurance and Status: (P) Insurance coverage has been reviewed Patient has primary care physician: (P) Yes Home environment has been reviewed: (P) from home with grandson Prior level of function:: (P) self Prior/Current Home Services: (P) No current home services Social Drivers of Health Review: (P) SDOH reviewed interventions complete Readmission risk has been reviewed: (P) Yes Transition of care needs: (P) no transition of care needs at this time

## 2024-07-03 ENCOUNTER — Other Ambulatory Visit (HOSPITAL_COMMUNITY): Payer: Self-pay

## 2024-07-03 LAB — URINE CULTURE: Culture: NO GROWTH

## 2024-07-03 LAB — GC/CHLAMYDIA PROBE AMP (~~LOC~~) NOT AT ARMC
Chlamydia: NEGATIVE
Comment: NEGATIVE
Comment: NORMAL
Neisseria Gonorrhea: NEGATIVE

## 2024-07-06 LAB — URINE CYTOLOGY ANCILLARY ONLY
Comment: NEGATIVE
Trichomonas: NEGATIVE

## 2024-07-07 ENCOUNTER — Other Ambulatory Visit: Payer: Self-pay

## 2024-07-07 ENCOUNTER — Ambulatory Visit (INDEPENDENT_AMBULATORY_CARE_PROVIDER_SITE_OTHER): Payer: Self-pay

## 2024-07-07 VITALS — BP 147/58 | HR 65 | Temp 97.6°F | Ht 68.0 in | Wt 144.0 lb

## 2024-07-07 DIAGNOSIS — R634 Abnormal weight loss: Secondary | ICD-10-CM | POA: Insufficient documentation

## 2024-07-07 DIAGNOSIS — I110 Hypertensive heart disease with heart failure: Secondary | ICD-10-CM

## 2024-07-07 DIAGNOSIS — E782 Mixed hyperlipidemia: Secondary | ICD-10-CM

## 2024-07-07 DIAGNOSIS — Z95810 Presence of automatic (implantable) cardiac defibrillator: Secondary | ICD-10-CM

## 2024-07-07 DIAGNOSIS — I7143 Infrarenal abdominal aortic aneurysm, without rupture: Secondary | ICD-10-CM | POA: Insufficient documentation

## 2024-07-07 DIAGNOSIS — I4729 Other ventricular tachycardia: Secondary | ICD-10-CM

## 2024-07-07 DIAGNOSIS — Z79899 Other long term (current) drug therapy: Secondary | ICD-10-CM

## 2024-07-07 DIAGNOSIS — I2510 Atherosclerotic heart disease of native coronary artery without angina pectoris: Secondary | ICD-10-CM

## 2024-07-07 DIAGNOSIS — M542 Cervicalgia: Secondary | ICD-10-CM | POA: Insufficient documentation

## 2024-07-07 DIAGNOSIS — Z792 Long term (current) use of antibiotics: Secondary | ICD-10-CM

## 2024-07-07 DIAGNOSIS — N410 Acute prostatitis: Secondary | ICD-10-CM | POA: Insufficient documentation

## 2024-07-07 DIAGNOSIS — N3000 Acute cystitis without hematuria: Secondary | ICD-10-CM | POA: Insufficient documentation

## 2024-07-07 DIAGNOSIS — I10 Essential (primary) hypertension: Secondary | ICD-10-CM

## 2024-07-07 DIAGNOSIS — I5042 Chronic combined systolic (congestive) and diastolic (congestive) heart failure: Secondary | ICD-10-CM

## 2024-07-07 DIAGNOSIS — K219 Gastro-esophageal reflux disease without esophagitis: Secondary | ICD-10-CM

## 2024-07-07 DIAGNOSIS — F17210 Nicotine dependence, cigarettes, uncomplicated: Secondary | ICD-10-CM

## 2024-07-07 DIAGNOSIS — N179 Acute kidney failure, unspecified: Secondary | ICD-10-CM

## 2024-07-07 DIAGNOSIS — B9620 Unspecified Escherichia coli [E. coli] as the cause of diseases classified elsewhere: Secondary | ICD-10-CM

## 2024-07-07 DIAGNOSIS — H539 Unspecified visual disturbance: Secondary | ICD-10-CM | POA: Insufficient documentation

## 2024-07-07 DIAGNOSIS — Z955 Presence of coronary angioplasty implant and graft: Secondary | ICD-10-CM

## 2024-07-07 MED ORDER — CLOPIDOGREL BISULFATE 75 MG PO TABS
75.0000 mg | ORAL_TABLET | Freq: Every day | ORAL | 3 refills | Status: AC
  Filled 2024-07-07: qty 90, 90d supply, fill #0

## 2024-07-07 MED ORDER — AMLODIPINE BESYLATE 10 MG PO TABS
10.0000 mg | ORAL_TABLET | Freq: Every day | ORAL | 3 refills | Status: AC
  Filled 2024-07-07: qty 90, 90d supply, fill #0

## 2024-07-07 MED ORDER — ATORVASTATIN CALCIUM 80 MG PO TABS
80.0000 mg | ORAL_TABLET | Freq: Every day | ORAL | 11 refills | Status: AC
  Filled 2024-07-07: qty 30, 30d supply, fill #0

## 2024-07-07 MED ORDER — SULFAMETHOXAZOLE-TRIMETHOPRIM 800-160 MG PO TABS
1.0000 | ORAL_TABLET | Freq: Two times a day (BID) | ORAL | 0 refills | Status: AC
  Filled 2024-07-07: qty 44, 22d supply, fill #0

## 2024-07-07 MED ORDER — SERTRALINE HCL 50 MG PO TABS
50.0000 mg | ORAL_TABLET | Freq: Every day | ORAL | 1 refills | Status: AC
  Filled 2024-07-07: qty 30, 30d supply, fill #0

## 2024-07-07 NOTE — Assessment & Plan Note (Signed)
 Endorsed blurry vision when reading and difficulty driving at night - encouraged to follow up with optomitrist for changes in vision

## 2024-07-07 NOTE — Patient Instructions (Addendum)
 Today we discussed the following medical conditions and plan:   We will check your kidney numbers today, I will let you know the results when they come  Prostatitis  - Take TMP-SMX (Bactrim ) 800-160 mg twice daily (end date 11/19) - Stop taking cefpodoxime  - See urology November 6   Loss of Appetite  - Continue drinking ensures  High blood pressure  - Keep taking your metoprolol  succinate (75 mg total once in the morning) - keep taking amlodipine  10 mg daily  - ill refill your losartan  if your kidney numbers look okay   Left submandibular pain / teeth  - Call around to dentists offices to see if you can make an appointment or have a payment plan set up.  - the antibiotics you're on now should help, but if you are still having pain when you complete the course, let us  know.   Vision changes  -try to call an optometrist to make an appointment and discuss your changes in vision (blurry vision when reading and difficulty driving at night)  Other:  I refilled your Plavix    We look forward to seeing you next time. Please call our clinic at [phone] if you have any questions or concerns. The best time to call is Monday-Friday from 9am-4pm, but there is someone available 24/7. If you need medication refills, please notify your pharmacy one week in advance and they will send us  a request.   Thank you for trusting me with your care. Wishing you the best!   Sallyanne Primas, DO  [facility]

## 2024-07-07 NOTE — Assessment & Plan Note (Addendum)
 Home medications include amlodipine  10 mg daily, losartan  50 mg daily, metoprolol  succinate 75 mg twice daily.  Losartan  held during hospitalization due to AKI.  Heart rate 65.  With low to normal heart rate, will continue on metoprolol  succinate 75 mg once daily. -Restart losartan  50 mg daily if AKI resolved -Continue amlodipine  10 mg daily -Continue metoprolol  succinate 75 mg once daily

## 2024-07-07 NOTE — Assessment & Plan Note (Addendum)
 CT demonstrated prostamegaly.  Received ceftriaxone  while admitted.  Urinary retention resolved with 1 dose of Flomax.  Urinary retention today: pain is resolving and urinating well. G/C testing negative.  Trichomonas negative.  Urinary culture and sensitivities: 10/20 E. coli resistant to ampicillin  and ampicillin /sulbactam. Discharged on cefpodoxime 400 mg twice daily for 28 days (10/22-11/19). Has been taking 2 pill twice a day.  -Transition to TMP-SMX 800-160 mg twice daily given sensitivities and improved prostate penetration (end date 11/19) - Instructed to stop taking cefpodoxime  - Urology follow-up rescheduled to November 6

## 2024-07-07 NOTE — Assessment & Plan Note (Signed)
 Noted since he was discharged from the hospital.  Document weight loss from 168 pounds to 144 pounds.  Has been 140 pounds before, but this weight loss was documented in the matter of days.  Stated he wants to eat, however has to force himself to eat, nothing tastes good, and he has pain with swallowing solid food described as sharp in left submandibular node on left side. Additionally feels pain if pressing on nodule.  Liquids okay.  Denies pain in teeth or gums. Denies abdominal pain, nausea, black stool, blood in stool.  On physical exam, there is a mobile, soft 2 cm submandibular node that is tender to palpation.  Oral examination shows no teeth on top, 3 teeth visible on bottom.  Visible teeth with dental caries, gums appear swollen.  Unable to appreciate abscesses.  Unilateral submandibular lymph node swelling possibly due to dental disease. Last seen a dentist 4 to 5 years ago, however it was a bad experience for the patient as they removed teeth they stated they would not.  Patient unfortunately with Medicaid A only and therefore cost will be prohibitive; encouraged calling dental offices in the area.  Currently on antibiotics for bacterial prostatitis, this should cover any other infectious process caused from mouth.  Patient instructed to return to clinic if submandibular node continues to cause pain and issues with eating. - Encouraged Ensures  - Encouraged calling dentist office in the area for dental care and possible further dental workup causing left submandibular swelling.

## 2024-07-07 NOTE — Assessment & Plan Note (Addendum)
 NSVT status post ICD placement.  Patient had a left heart cath with intervention in April 2024 with drug-eluting stent to proximal and mid RCA. Patient also had an echo in November 2024 showing ejection fraction of 45 to 50% with left ventricular global hypokinesis. Jardiance  discontinued due to indefinable recurrent UTIs. Stated adherence with ASA and Plavix , requesting refills.  - Continue ASA and Plavix  per cardiology with extensive nature of stents (see cardiology note 10/11/2023) - refilled plavix   - Continue torsemide  20 mg as needed

## 2024-07-07 NOTE — Assessment & Plan Note (Addendum)
 Noted on CT scan 10/22.

## 2024-07-07 NOTE — Assessment & Plan Note (Addendum)
 Patient has history of hyperlipidemia.  Lipid panel 09/2023 cholesterol 168, LDL 88. - Continue atorvastatin  80 mg daily - Repeat lipid panel in 3 months

## 2024-07-07 NOTE — Progress Notes (Signed)
 CC: Hospital follow-up  HPI:  Follow up Hospitalization  [name] is a 83 y.o. male with pertinent past medical history of CAD, COPD, HFrEF, HTN (further medical history stated below) and presents today for hospital follow-up. Please see problem based assessment and plan for additional details.  Patient was admitted to [facility] on 07/01/2024 and discharged on 07/02/2024. He was treated for recurrent UTI and acute versus chronic bacterial prostatitis and AKI. Treatment for this included ceftriaxone  and was discharged on high-dose cefpodoxime X 4 weeks. Telephone follow up was done on 07/02/2024 He reports excellent compliance with treatment. He reports this condition is resolved.  Last Pertinent Labs Documented:     Latest Ref Rng & Units 07/02/2024    2:05 PM 07/02/2024    2:02 AM 07/01/2024    5:06 PM  BMP  Glucose 70 - 99 mg/dL 867  881  843   BUN 8 - 23 mg/dL 23  26  30    Creatinine 0.61 - 1.24 mg/dL 8.32  8.38  8.11   Sodium 135 - 145 mmol/L 140  141  140   Potassium 3.5 - 5.1 mmol/L 3.8  3.8  4.1   Chloride 98 - 111 mmol/L 105  108  105   CO2 22 - 32 mmol/L 22  21  21    Calcium  8.9 - 10.3 mg/dL 9.1  9.1  9.5        Latest Ref Rng & Units 07/02/2024    2:02 AM 07/01/2024    5:06 PM 05/10/2024    6:07 AM  CBC  WBC 4.0 - 10.5 K/uL 7.6  7.1  10.4   Hemoglobin 13.0 - 17.0 g/dL 88.6  87.6  87.9   Hematocrit 39.0 - 52.0 % 35.6  39.2  38.3   Platelets 150 - 400 K/uL 148  154  194     Lab Results  Component Value Date   HGBA1C 6.1 (H) 12/12/2022     Past Medical History:  Diagnosis Date   Acute renal failure 05/31/2014   Angina decubitus 06/15/2014   CAD (coronary artery disease)    a. s/p stent RCA DES 9/09; b. 2014 Attempted PCI of OM1 @ [place];  c. 04/2014 Cath: LAD 40-50p, D1 95-99 (chronic), LCX 30-40 inf branch, OM1 CTO, RCA 30-40p, RCA patent stent, EF 35%->Med Rx.   Chronic combined systolic and diastolic CHF (congestive heart failure) (HCC)    a.  EF about 40 to 45% per echo in April 2013;  b. 04/2014 Echo: EF 20-25%, sev LVH, sev glob HK, Gr 1 DD, mildly reduced RV fxn, PASP . c. 01/2017: EF improved to 50-55%.    Chronic kidney disease (CKD), stage III (moderate) (HCC)    Chronic low back pain    Complete heart block (HCC)    COPD (chronic obstructive pulmonary disease) (HCC) dx 06/2013   PFTs 07/08/13: mod obst with resp to bronchodilator, moderate decrease diffusion, airtrapping   COPD with acute exacerbation (HCC) 01/17/2017   COPD with asthma (HCC)    07/08/13 PFT: FEV1 1.74L (66% pred, 30% change with BD), mod obst with resp to bronchodilator, moderate decrease diffusion, air-trapping 11/2013 Simple spiro>> clear obstruction, FEV1 1.30 L (47% pred) - trial of symbicort  160 2bid 01/26/15      Depression 09/15/2018   Assessment: Increased sadness and depression since losing job  Plan: Patient denies suicidal and homicidal ideations Patient would not like to start medication therapies at this time Will establish patient with community health  and wellness for primary care   Dyslipidemia    a. on statin   Essential hypertension    HLD (hyperlipidemia)    HTN (hypertension)    a. Reports intolerance to hydralazine ; b. no beta blockers 2/2 bradycardia;  c. failed on ACE and ARB.   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 04/2014 Echo: EF 20-25%, sev glob HK.   Mixed Ischemic/Non-Ischemic Cardiomyopathy    probable mixed ischemic and non-ischemic    Noncompliance    OSA (obstructive sleep apnea) 04/18/2018   06/11/16 - home sleep study shows AHI of 2.9 an hour with the lowest SaO2 of 79% with an average of 93%  03/08/2018-Home sleep study-AHI 7/HR, SaO2 low 81%    Paroxysmal atrial fibrillation (HCC)    a. identified on device interrogation 01/2016   Pneumonia 09/02/2013    Pulmonary edema 05/10/2014   Respiratory arrest (HCC) 05/18/2014   Sinus bradycardia 05/31/2014   Ventricular tachycardia (HCC)    treated with ATP,  CL 250-300 msec    Current Outpatient Medications on File Prior to Visit  Medication Sig Dispense Refill   acetaminophen  (TYLENOL ) 650 MG CR tablet Take 650 mg by mouth daily as needed (for shoulder pain).     albuterol  (VENTOLIN  HFA) 108 (90 Base) MCG/ACT inhaler Inhale 2 puffs into the lungs every 6 (six) hours as needed for wheezing or shortness of breath. (Patient not taking: Reported on 07/01/2024) 6.7 g 3   aspirin  EC 81 MG tablet Take 1 tablet (81 mg total) by mouth daily. Swallow whole. 30 tablet 0   cefadroxil (DURICEF) 500 MG capsule Take 2 capsules (1,000 mg total) by mouth 2 (two) times daily for 28 days. 112 capsule 0   Ensure (ENSURE) Take 237 mLs by mouth in the morning.     famotidine  (PEPCID ) 20 MG tablet Take 0.5 tablets (10 mg total) by mouth daily. 30 tablet 0   Fluticasone -Umeclidin-Vilant (TRELEGY ELLIPTA ) 200-62.5-25 MCG/ACT AEPB Inhale 1 Inhalation into the lungs daily. (Patient taking differently: Inhale 1 Inhalation into the lungs in the morning.)     ipratropium-albuterol  (DUONEB) 0.5-2.5 (3) MG/3ML SOLN Take 3 mLs by nebulization every 6 (six) hours as needed (wheezing or shortness of breath). 360 mL 1   [Paused] losartan  (COZAAR ) 100 MG tablet Take 0.5 tablets (50 mg total) by mouth daily. 30 tablet 11   metoprolol  succinate (TOPROL -XL) 25 MG 24 hr tablet Take 3 tablets (75 mg total) by mouth daily.     [Paused] torsemide  (DEMADEX ) 20 MG tablet Take 1 tablet (20 mg total) by mouth daily as needed (fluid). 30 tablet 0   No current facility-administered medications on file prior to visit.    Family History  Problem Relation Age of Onset   Heart attack Neg Hx    Stroke Neg Hx     Social History   Socioeconomic History   Marital status: Widowed    Spouse name: Not on file   Number of children: 3   Years of  education: Not on file   Highest education level: Not on file  Occupational History   Occupation: security guard.  Tobacco Use   Smoking status: Some Days    Current packs/day: 0.00    Average packs/day: 0.5 packs/day for 56.0 years (28.0 ttl pk-yrs)    Types: Cigarettes    Start date: 09/10/1956  Last attempt to quit: 09/10/2012    Years since quitting: 11.8   Smokeless tobacco: Never   Tobacco comments:    tried 1 month ago (05/2018) but didn't like it   Vaping Use   Vaping status: Never Used  Substance and Sexual Activity   Alcohol  use: No   Drug use: No   Sexual activity: Not Currently  Other Topics Concern   Not on file  Social History Narrative          Social Drivers of Health   Financial Resource Strain: High Risk (07/08/2023)   Overall Financial Resource Strain (CARDIA)    Difficulty of Paying Living Expenses: Hard  Food Insecurity: No Food Insecurity (07/02/2024)   Hunger Vital Sign    Worried About Running Out of Food in the Last Year: Never true    Ran Out of Food in the Last Year: Never true  Transportation Needs: No Transportation Needs (07/02/2024)   PRAPARE - Administrator, Civil Service (Medical): No    Lack of Transportation (Non-Medical): No  Physical Activity: Not on file  Stress: Not on file  Social Connections: Moderately Isolated (07/02/2024)   Social Connection and Isolation Panel    Frequency of Communication with Friends and Family: Twice a week    Frequency of Social Gatherings with Friends and Family: Twice a week    Attends Religious Services: 1 to 4 times per year    Active Member of [employer] or Organizations: No    Attends Banker Meetings: Never    Marital Status: Widowed  Intimate Partner Violence: Not At Risk (07/02/2024)   Humiliation, Afraid, Rape, and Kick questionnaire    Fear of Current or Ex-Partner: No    Emotionally Abused: No    Physically Abused: No    Sexually Abused: No    Review of Systems: As  stated below  Vitals:   07/07/24 1048  BP: (!) 147/58  Pulse: 65  Temp: 97.6 F (36.4 C)  TempSrc: Oral  SpO2: 100%  Weight: 144 lb (65.3 kg)  Height: 5' 8 (1.727 m)    Physical Exam: Physical Exam Constitutional:      General: He is not in acute distress.    Appearance: He is not ill-appearing or toxic-appearing.  HENT:     Mouth/Throat:     Mouth: Mucous membranes are moist.     Dentition: Abnormal dentition. No dental abscesses.  Neck:     Comments: Mobile, soft, 2 cm submandibular lymph node.  Tender to palpation.  No other lymph node enlargement or swelling appreciated along the posterior cervical chain, bilateral supraclavicular, or right submandibular nodes. Cardiovascular:     Rate and Rhythm: Normal rate and regular rhythm.     Heart sounds: Normal heart sounds. No murmur heard.    No friction rub. No gallop.  Pulmonary:     Effort: Pulmonary effort is normal.     Breath sounds: No stridor. No wheezing, rhonchi or rales.  Abdominal:     General: Bowel sounds are normal.     Palpations: Abdomen is soft.  Musculoskeletal:     Right lower leg: No edema.     Left lower leg: No edema.  Skin:    General: Skin is warm and dry.  Neurological:     Mental Status: He is alert.     Assessment & Plan:   Patient seen with Dr. Lovie  Assessment & Plan Acute bacterial prostatitis Acute cystitis without hematuria CT demonstrated prostamegaly.  Received ceftriaxone  while admitted.  Urinary retention resolved with 1 dose of Flomax.  Urinary retention today: pain is resolving and urinating well. G/C testing negative.  Trichomonas negative.  Urinary culture and sensitivities: 10/20 E. coli resistant to ampicillin  and ampicillin /sulbactam. Discharged on cefpodoxime 400 mg twice daily for 28 days (10/22-11/19). Has been taking 2 pill twice a day.  -Transition to TMP-SMX 800-160 mg twice daily given sensitivities and improved prostate penetration (end date 11/19) - Instructed  to stop taking cefpodoxime  - Urology follow-up rescheduled to November 6 Neck pain on left side Weight loss Noted since he was discharged from the hospital.  Document weight loss from 168 pounds to 144 pounds.  Has been 140 pounds before, but this weight loss was documented in the matter of days.  Stated he wants to eat, however has to force himself to eat, nothing tastes good, and he has pain with swallowing solid food described as sharp in left submandibular node on left side. Additionally feels pain if pressing on nodule.  Liquids okay.  Denies pain in teeth or gums. Denies abdominal pain, nausea, black stool, blood in stool.  On physical exam, there is a mobile, soft 2 cm submandibular node that is tender to palpation.  Oral examination shows no teeth on top, 3 teeth visible on bottom.  Visible teeth with dental caries, gums appear swollen.  Unable to appreciate abscesses.  Unilateral submandibular lymph node swelling possibly due to dental disease. Last seen a dentist 4 to 5 years ago, however it was a bad experience for the patient as they removed teeth they stated they would not.  Patient unfortunately with Medicaid A only and therefore cost will be prohibitive; encouraged calling dental offices in the area.  Currently on antibiotics for bacterial prostatitis, this should cover any other infectious process caused from mouth.  Patient instructed to return to clinic if submandibular node continues to cause pain and issues with eating. - Encouraged Ensures  - Encouraged calling dentist office in the area for dental care and possible further dental workup causing left submandibular swelling. 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  Creatinine 1.67 on day of discharge.  Urinary retention resolved. Feels like he is emptying fully when he goes to the restroom.  - BMP ordered, will restart losartan  depending on results. 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  Patient had a left heart cath with intervention in April 2024 with drug-eluting stent to proximal and mid RCA. Patient also had an echo in November 2024 showing ejection fraction of 45 to 50% with left ventricular global hypokinesis. Jardiance  discontinued due to indefinable recurrent UTIs. Stated adherence with ASA and Plavix , requesting refills.  - Continue ASA and Plavix  per cardiology with extensive nature of stents (see cardiology note 10/11/2023) - refilled plavix   - Continue torsemide  20 mg as needed Essential hypertension Home medications include amlodipine  10 mg daily, losartan  50 mg daily, metoprolol  succinate 75 mg twice daily.  Losartan  held during hospitalization due to AKI.  Heart rate 65.  With low to normal heart rate, will continue on metoprolol  succinate 75 mg once daily. -Restart losartan  50 mg daily if AKI resolved -Continue amlodipine  10 mg daily -Continue metoprolol  succinate 75 mg once daily Mixed hyperlipidemia Patient has history of hyperlipidemia.  Lipid panel 09/2023 cholesterol 168, LDL 88. -  Continue atorvastatin  80 mg daily - Repeat lipid panel in 3 months Gastroesophageal reflux disease, unspecified whether esophagitis present Home dose Pepcid  20 mg daily.  Reduced to 10 mg daily given creatinine clearance, however patient stated he was taking 20 mg because it wasn't working as it should.  -Will go back to 10 mg if creatinine clearance has not improved Changes in vision Endorsed blurry vision when reading and difficulty driving at night - encouraged to follow up with optomitrist for changes in vision Infrarenal abdominal aortic aneurysm (AAA) without rupture Noted on CT scan 10/22.   Orders Placed This Encounter  Procedures   Basic metabolic panel with GFR      Sallyanne Primas, D.O. [facility] Internal Medicine, PGY-1 Date 07/07/2024 Time 2:17 PM

## 2024-07-07 NOTE — Assessment & Plan Note (Addendum)
 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  Creatinine 1.67 on day of discharge.  Urinary retention resolved. Feels like he is emptying fully when he goes to the restroom.  - BMP ordered, will restart losartan  depending on results.

## 2024-07-07 NOTE — Assessment & Plan Note (Addendum)
 Home dose Pepcid  20 mg daily.  Reduced to 10 mg daily given creatinine clearance, however patient stated he was taking 20 mg because it wasn't working as it should.  -Will go back to 10 mg if creatinine clearance has not improved

## 2024-07-08 ENCOUNTER — Ambulatory Visit: Payer: Self-pay

## 2024-07-08 LAB — BASIC METABOLIC PANEL WITH GFR
BUN/Creatinine Ratio: 18 (ref 10–24)
BUN: 23 mg/dL (ref 8–27)
CO2: 23 mmol/L (ref 20–29)
Calcium: 9.2 mg/dL (ref 8.6–10.2)
Chloride: 104 mmol/L (ref 96–106)
Creatinine, Ser: 1.29 mg/dL — ABNORMAL HIGH (ref 0.76–1.27)
Glucose: 81 mg/dL (ref 70–99)
Potassium: 4.2 mmol/L (ref 3.5–5.2)
Sodium: 143 mmol/L (ref 134–144)
eGFR: 56 mL/min/1.73 — ABNORMAL LOW (ref >59)

## 2024-07-08 NOTE — Progress Notes (Signed)
 Internal Medicine Clinic Attending  I was physically present during the key portions of the resident provided service and participated in the medical decision making of patient's management care. I reviewed pertinent patient test results.  The assessment, diagnosis, and plan were formulated together and I agree with the documentation in the resident's note.  Lovie Clarity, MD   I think we can restart his Losartan  and prn Torsemide . He will work on following up with a education officer, community.  His weight was 140s last spring, then up to 160s this summer, now back to 140s. Will trend his weight closely - if he continues to lose weight, will check CMP, CBC, TSH, and consider imaging.

## 2024-07-17 NOTE — Telephone Encounter (Signed)
 Received notification from [employer] regarding approval for TRELEGY. Patient assistance approved from 08/05/24 to 08/04/25.  Medication will ship to [facility]  Company phone: [phone]

## 2024-08-10 ENCOUNTER — Ambulatory Visit: Payer: Self-pay

## 2024-08-10 DIAGNOSIS — I5022 Chronic systolic (congestive) heart failure: Secondary | ICD-10-CM

## 2024-08-11 ENCOUNTER — Ambulatory Visit: Payer: Self-pay

## 2024-08-11 LAB — CUP PACEART REMOTE DEVICE CHECK
Battery Remaining Longevity: 28 mo
Battery Voltage: 2.94 V
Brady Statistic AP VP Percent: 79.67 %
Brady Statistic AP VS Percent: 0.11 %
Brady Statistic AS VP Percent: 19.97 %
Brady Statistic AS VS Percent: 0.24 %
Brady Statistic RA Percent Paced: 78.09 %
Brady Statistic RV Percent Paced: 96.81 %
Date Time Interrogation Session: 20251201012302
HighPow Impedance: 59 Ohm
Implantable Lead Connection Status: 753985
Implantable Lead Connection Status: 753985
Implantable Lead Connection Status: 753985
Implantable Lead Implant Date: 20151203
Implantable Lead Implant Date: 20151203
Implantable Lead Implant Date: 20151203
Implantable Lead Location: 753858
Implantable Lead Location: 753859
Implantable Lead Location: 753860
Implantable Lead Model: 4598
Implantable Lead Model: 5076
Implantable Lead Model: 6935
Implantable Pulse Generator Implant Date: 20221129
Lead Channel Impedance Value: 171 Ohm
Lead Channel Impedance Value: 175.622
Lead Channel Impedance Value: 184.154
Lead Channel Impedance Value: 184.154
Lead Channel Impedance Value: 189.525
Lead Channel Impedance Value: 247 Ohm
Lead Channel Impedance Value: 304 Ohm
Lead Channel Impedance Value: 342 Ohm
Lead Channel Impedance Value: 342 Ohm
Lead Channel Impedance Value: 361 Ohm
Lead Channel Impedance Value: 361 Ohm
Lead Channel Impedance Value: 399 Ohm
Lead Channel Impedance Value: 456 Ohm
Lead Channel Impedance Value: 589 Ohm
Lead Channel Impedance Value: 608 Ohm
Lead Channel Impedance Value: 608 Ohm
Lead Channel Impedance Value: 646 Ohm
Lead Channel Impedance Value: 665 Ohm
Lead Channel Pacing Threshold Amplitude: 0.625 V
Lead Channel Pacing Threshold Amplitude: 0.875 V
Lead Channel Pacing Threshold Amplitude: 1.625 V
Lead Channel Pacing Threshold Pulse Width: 0.4 ms
Lead Channel Pacing Threshold Pulse Width: 0.4 ms
Lead Channel Pacing Threshold Pulse Width: 0.8 ms
Lead Channel Sensing Intrinsic Amplitude: 1.875 mV
Lead Channel Sensing Intrinsic Amplitude: 1.875 mV
Lead Channel Sensing Intrinsic Amplitude: 17.75 mV
Lead Channel Sensing Intrinsic Amplitude: 17.75 mV
Lead Channel Setting Pacing Amplitude: 1.5 V
Lead Channel Setting Pacing Amplitude: 2.5 V
Lead Channel Setting Pacing Amplitude: 2.5 V
Lead Channel Setting Pacing Pulse Width: 0.4 ms
Lead Channel Setting Pacing Pulse Width: 0.8 ms
Lead Channel Setting Sensing Sensitivity: 0.3 mV
Zone Setting Status: 755011
Zone Setting Status: 755011

## 2024-08-12 ENCOUNTER — Ambulatory Visit: Payer: Self-pay

## 2024-08-13 NOTE — Telephone Encounter (Signed)
 LM and sent a my chart message requesting patient call us  to discuss VT events, 10/25 event with ATP.   12/3 presenting rhythm shows patient is in an AP/BVP normal rhythm.  He has had 1 new short NSVT approx 15 beat duration on 12/1. Patient initiated this transmission.    Past due for follow up with Dr. Inocencio, will also need appointment.

## 2024-08-14 NOTE — Progress Notes (Signed)
 Remote ICD Transmission

## 2024-08-17 NOTE — Telephone Encounter (Signed)
 Patient is returning call.

## 2024-08-17 NOTE — Telephone Encounter (Signed)
 Called patient back to discuss ATP episode and notify of driving restrictions until 01/02/25  No answer. LMTCB

## 2024-08-18 ENCOUNTER — Telehealth: Payer: Self-pay

## 2024-08-18 NOTE — Telephone Encounter (Signed)
 Pt called in wanting to make an appt with Dr. Jordan

## 2024-08-19 NOTE — Telephone Encounter (Signed)
 Outreach made to Pt.  Per Pt he denies any symptoms related to this episode.  He states he takes Toprol  75 mg daily with no missed doses.  He states he used to take more Toprol  but someone had decreased his dose.  Advised Pt no driving x 6 months.  He indicates understanding.  Scheduled to see Dr. Inocencio August 26, 2024 at 2:30 pm.  Directions given to new office.  Pt states he will need his handicap placard renewed.

## 2024-08-19 NOTE — Telephone Encounter (Signed)
 Gershon Alan BROCKS, RN    08/13/24 12:16 PM Note LM and sent a my chart message requesting patient call us  to discuss VT events, 10/25 event with ATP.   12/3 presenting rhythm shows patient is in an AP/BVP normal rhythm.  He has had 1 new short NSVT approx 15 beat duration on 12/1. Patient initiated this transmission.    Past due for follow up with Dr. Inocencio, will also need appointment.

## 2024-08-26 ENCOUNTER — Encounter: Payer: Self-pay | Admitting: Cardiology

## 2024-08-26 ENCOUNTER — Other Ambulatory Visit: Payer: Self-pay

## 2024-08-26 ENCOUNTER — Ambulatory Visit: Payer: Self-pay | Attending: Cardiology | Admitting: Cardiology

## 2024-08-26 DIAGNOSIS — I480 Paroxysmal atrial fibrillation: Secondary | ICD-10-CM

## 2024-08-26 DIAGNOSIS — I2510 Atherosclerotic heart disease of native coronary artery without angina pectoris: Secondary | ICD-10-CM

## 2024-08-26 DIAGNOSIS — I255 Ischemic cardiomyopathy: Secondary | ICD-10-CM

## 2024-08-26 DIAGNOSIS — I10 Essential (primary) hypertension: Secondary | ICD-10-CM

## 2024-08-26 DIAGNOSIS — I4729 Other ventricular tachycardia: Secondary | ICD-10-CM

## 2024-08-26 DIAGNOSIS — Z95810 Presence of automatic (implantable) cardiac defibrillator: Secondary | ICD-10-CM

## 2024-08-26 DIAGNOSIS — I472 Ventricular tachycardia, unspecified: Secondary | ICD-10-CM

## 2024-08-26 DIAGNOSIS — I5022 Chronic systolic (congestive) heart failure: Secondary | ICD-10-CM

## 2024-08-26 MED ORDER — METOPROLOL SUCCINATE ER 25 MG PO TB24
75.0000 mg | ORAL_TABLET | Freq: Every day | ORAL | 1 refills | Status: AC
  Filled 2024-08-26: qty 270, 90d supply, fill #0

## 2024-08-26 NOTE — Patient Instructions (Signed)
 Medication Instructions:  Your physician recommends that you continue on your current medications as directed. Please refer to the Current Medication list given to you today.  *If you need a refill on your cardiac medications before your next appointment, please call your pharmacy*  Lab Work: None ordered  If you have any lab test that is abnormal or we need to change your treatment, we will call you to review the results.  Testing/Procedures: None ordered  Follow-Up: At [facility], you and your health needs are our priority.  As part of our continuing mission to provide you with exceptional heart care, our providers are all part of one team.  This team includes your primary Cardiologist (physician) and Advanced Practice Providers or APPs (Physician Assistants and Nurse Practitioners) who all work together to provide you with the care you need, when you need it.  Your next appointment:   6 month(s)  Provider:   You will see one of the following Advanced Practice Providers on your designated Care Team:   Charlies Arthur, PA-C Michael Andy Tillery, PA-C Suzann Riddle, NP Daphne Barrack, NP Artist Pouch, PA-C   Your physician recommends that you schedule a follow-up appointment in: February/March 2026 with Dr. Jordan    Thank you for choosing [facility]!!   Maeola Domino, RN [phone]    You should not be driving, per the state of [place] .  You will be able to resume driving after 8/74/7973, as long as you do not receive anymore therapy from your defibrillator.

## 2024-08-26 NOTE — Progress Notes (Signed)
 Electrophysiology Office Note:   Date:  08/26/2024  ID:  [name], DOB [birth year], MRN [id number]  Primary Cardiologist: Peter Jordan, MD Primary Heart Failure: None Electrophysiologist: Tiondra Fang Gladis Norton, MD      History of Present Illness:   [name] is a 83 y.o. male with h/o chronic systolic heart failure, nonsustained VT, coronary artery disease post LAD stent, hypertension, hyperlipidemia, COPD seen today for routine electrophysiology followup.   Discussed the use of AI scribe software for clinical note transcription with the patient, who gave verbal consent to proceed.  History of Present Illness [name] is an 83 year old male with a history of defibrillator implantation who presents for evaluation of a recent arrhythmia episode.  He was informed by a phone call that his defibrillator was activated due to an arrhythmia episode on October 25th. He recalls experiencing dizziness and brief episodes of syncope lasting two to three seconds, with the most recent episode occurring approximately two weeks ago. However, his device did not record any arrhythmias during that recent episode.  In October, his heart rate escalated to 250 beats per minute, which was treated by his defibrillator. This was the first instance of therapy from his defibrillator, aside from a previous battery change. He occasionally experiences episodes where he feels the need to stand still and uses nitroglycerin  sublingually to stabilize his breathing.  No recent increase in fatigue, shortness of breath, or other symptoms, noting that his condition has been stable. He maintains a routine of physical activity, including ten pushups and sit-ups, and averages 9,000 steps per day. He is mindful of shoulder tightness during exercise and adjusts his activity accordingly.  He holds a handicap parking permit due to his condition and notes difficulty in securing parking at his workplace, often arriving 30  minutes early to find a spot.  he denies chest pain, palpitations, dyspnea, PND, orthopnea, nausea, vomiting, dizziness, syncope, edema, weight gain, or early satiety.   Review of systems complete and found to be negative unless listed in HPI.      EP Information / Studies Reviewed:    EKG is ordered today. Personal review as below.  EKG Interpretation Date/Time:  Wednesday August 26 2024 14:32:20 EST Ventricular Rate:  77 PR Interval:  160 QRS Duration:  152 QT Interval:  436 QTC Calculation: 493 R Axis:   -85  Text Interpretation: AV dual-paced rhythm Biventricular pacemaker detected When compared with ECG of 11-Oct-2023 09:41, No significant change since last tracing Confirmed by Javeah Loeza ([id number]) on 08/26/2024 2:34:39 PM   ICD Interrogation-  reviewed in detail today,  See PACEART report.  Device History: Medtronic BiV ICD implanted 08/12/2014 for VT History of appropriate therapy: No History of AAD therapy: No   Risk Assessment/Calculations:    CHA2DS2-VASc Score = 7   This indicates a 11.2% annual risk of stroke. The patient's score is based upon: CHF History: 1 HTN History: 1 Diabetes History: 0 Stroke History: 2 Vascular Disease History: 1 Age Score: 2 Gender Score: 0            Physical Exam:   VS:  BP (!) 160/84 (BP Location: Right Arm, Patient Position: Sitting, Cuff Size: Normal)   Pulse 77   Ht 5' 8 (1.727 m)   Wt 147 lb (66.7 kg)   SpO2 97%   BMI 22.35 kg/m    Wt Readings from Last 3 Encounters:  08/26/24 147 lb (66.7 kg)  07/07/24 144 lb (65.3 kg)  07/02/24  160 lb 0.9 oz (72.6 kg)     GEN: Well nourished, well developed in no acute distress NECK: No JVD; No carotid bruits CARDIAC: Regular rate and rhythm, no murmurs, rubs, gallops RESPIRATORY:  Clear to auscultation without rales, wheezing or rhonchi  ABDOMEN: Soft, non-tender, non-distended EXTREMITIES:  No edema; No deformity   ASSESSMENT AND PLAN:    Chronic systolic  dysfunction s/p Medtronic CRT-D  euvolemic today Stable on an appropriate medical regimen Normal ICD function See Pace Art report No changes today  2.  Coronary disease: Post LAD stent.  No current angina.  Plan per primary cardiology.  3.  Hypertension: Blood pressure elevated today.  Usually well-controlled.  No changes.  4.  Ventricular tachycardia: Occurred July 04, 2024.  He was treated by ATP from his device.  He did not receive ICD shock.  There is no other documentation of ventricular arrhythmias despite his episodes of dizziness.  I have told him not to drive for 6 months from his ATP therapy per the state of [place] .  Disposition:   Follow up with EP Team in 6 months   Signed, Nashawn Hillock Gladis Norton, MD

## 2024-09-09 LAB — CUP PACEART INCLINIC DEVICE CHECK
Date Time Interrogation Session: 20251217212134
Implantable Lead Connection Status: 753985
Implantable Lead Connection Status: 753985
Implantable Lead Connection Status: 753985
Implantable Lead Implant Date: 20151203
Implantable Lead Implant Date: 20151203
Implantable Lead Implant Date: 20151203
Implantable Lead Location: 753858
Implantable Lead Location: 753859
Implantable Lead Location: 753860
Implantable Lead Model: 4598
Implantable Lead Model: 5076
Implantable Lead Model: 6935
Implantable Pulse Generator Implant Date: 20221129

## 2024-09-12 ENCOUNTER — Ambulatory Visit: Payer: Self-pay | Admitting: Cardiology

## 2024-10-23 ENCOUNTER — Ambulatory Visit: Payer: Self-pay | Admitting: Cardiology

## 2024-11-06 ENCOUNTER — Ambulatory Visit

## 2024-11-09 ENCOUNTER — Ambulatory Visit: Payer: Self-pay

## 2024-11-10 ENCOUNTER — Ambulatory Visit: Payer: Self-pay

## 2025-02-05 ENCOUNTER — Ambulatory Visit

## 2025-02-08 ENCOUNTER — Ambulatory Visit: Payer: Self-pay

## 2025-02-09 ENCOUNTER — Ambulatory Visit: Payer: Self-pay

## 2025-05-07 ENCOUNTER — Ambulatory Visit

## 2025-05-10 ENCOUNTER — Ambulatory Visit: Payer: Self-pay
